# Patient Record
Sex: Male | Born: 1975 | Hispanic: Yes | Smoking: Former smoker
Health system: Southern US, Community
[De-identification: ages and names within clinical notes are randomized; demographics above are authoritative.]

---

## 2021-05-24 ENCOUNTER — Emergency Department (HOSPITAL_COMMUNITY): Payer: Self-pay

## 2021-05-24 ENCOUNTER — Inpatient Hospital Stay (HOSPITAL_COMMUNITY)
Admission: EM | Admit: 2021-05-24 | Discharge: 2021-07-07 | DRG: 853 | Disposition: A | Discharge status: Home / Self Care | Payer: Self-pay | Attending: Internal Medicine | Admitting: Internal Medicine

## 2021-05-24 ENCOUNTER — Other Ambulatory Visit: Payer: Self-pay

## 2021-05-24 ENCOUNTER — Inpatient Hospital Stay (HOSPITAL_COMMUNITY): Payer: Self-pay

## 2021-05-24 DIAGNOSIS — M6282 Rhabdomyolysis: Secondary | ICD-10-CM | POA: Diagnosis present

## 2021-05-24 DIAGNOSIS — M60851 Other myositis, right thigh: Secondary | ICD-10-CM | POA: Diagnosis not present

## 2021-05-24 DIAGNOSIS — R262 Difficulty in walking, not elsewhere classified: Secondary | ICD-10-CM

## 2021-05-24 DIAGNOSIS — G9341 Metabolic encephalopathy: Secondary | ICD-10-CM | POA: Diagnosis present

## 2021-05-24 DIAGNOSIS — T68XXXA Hypothermia, initial encounter: Secondary | ICD-10-CM

## 2021-05-24 DIAGNOSIS — M6080 Other myositis, unspecified site: Secondary | ICD-10-CM

## 2021-05-24 DIAGNOSIS — F4321 Adjustment disorder with depressed mood: Secondary | ICD-10-CM

## 2021-05-24 DIAGNOSIS — E8770 Fluid overload, unspecified: Secondary | ICD-10-CM | POA: Diagnosis not present

## 2021-05-24 DIAGNOSIS — I469 Cardiac arrest, cause unspecified: Secondary | ICD-10-CM

## 2021-05-24 DIAGNOSIS — I739 Peripheral vascular disease, unspecified: Secondary | ICD-10-CM | POA: Insufficient documentation

## 2021-05-24 DIAGNOSIS — R509 Fever, unspecified: Secondary | ICD-10-CM

## 2021-05-24 DIAGNOSIS — Z452 Encounter for adjustment and management of vascular access device: Secondary | ICD-10-CM

## 2021-05-24 DIAGNOSIS — N170 Acute kidney failure with tubular necrosis: Secondary | ICD-10-CM | POA: Diagnosis present

## 2021-05-24 DIAGNOSIS — R34 Anuria and oliguria: Secondary | ICD-10-CM | POA: Diagnosis present

## 2021-05-24 DIAGNOSIS — N179 Acute kidney failure, unspecified: Secondary | ICD-10-CM | POA: Diagnosis present

## 2021-05-24 DIAGNOSIS — D638 Anemia in other chronic diseases classified elsewhere: Secondary | ICD-10-CM | POA: Diagnosis not present

## 2021-05-24 DIAGNOSIS — J189 Pneumonia, unspecified organism: Secondary | ICD-10-CM

## 2021-05-24 DIAGNOSIS — E871 Hypo-osmolality and hyponatremia: Secondary | ICD-10-CM | POA: Diagnosis not present

## 2021-05-24 DIAGNOSIS — J982 Interstitial emphysema: Secondary | ICD-10-CM

## 2021-05-24 DIAGNOSIS — K6812 Psoas muscle abscess: Secondary | ICD-10-CM

## 2021-05-24 DIAGNOSIS — A419 Sepsis, unspecified organism: Principal | ICD-10-CM | POA: Diagnosis present

## 2021-05-24 DIAGNOSIS — W19XXXA Unspecified fall, initial encounter: Secondary | ICD-10-CM

## 2021-05-24 DIAGNOSIS — E875 Hyperkalemia: Secondary | ICD-10-CM | POA: Diagnosis not present

## 2021-05-24 DIAGNOSIS — R680 Hypothermia, not associated with low environmental temperature: Secondary | ICD-10-CM | POA: Diagnosis present

## 2021-05-24 DIAGNOSIS — J9601 Acute respiratory failure with hypoxia: Secondary | ICD-10-CM | POA: Diagnosis present

## 2021-05-24 DIAGNOSIS — Z6820 Body mass index (BMI) 20.0-20.9, adult: Secondary | ICD-10-CM

## 2021-05-24 DIAGNOSIS — Z9289 Personal history of other medical treatment: Secondary | ICD-10-CM

## 2021-05-24 DIAGNOSIS — I10 Essential (primary) hypertension: Secondary | ICD-10-CM | POA: Diagnosis present

## 2021-05-24 DIAGNOSIS — R748 Abnormal levels of other serum enzymes: Secondary | ICD-10-CM

## 2021-05-24 DIAGNOSIS — K567 Ileus, unspecified: Secondary | ICD-10-CM | POA: Diagnosis not present

## 2021-05-24 DIAGNOSIS — D62 Acute posthemorrhagic anemia: Secondary | ICD-10-CM

## 2021-05-24 DIAGNOSIS — G7281 Critical illness myopathy: Secondary | ICD-10-CM | POA: Diagnosis not present

## 2021-05-24 DIAGNOSIS — J690 Pneumonitis due to inhalation of food and vomit: Secondary | ICD-10-CM | POA: Diagnosis present

## 2021-05-24 DIAGNOSIS — R100 Acute abdomen: Secondary | ICD-10-CM

## 2021-05-24 DIAGNOSIS — M21379 Foot drop, unspecified foot: Secondary | ICD-10-CM

## 2021-05-24 DIAGNOSIS — D75839 Thrombocytosis, unspecified: Secondary | ICD-10-CM | POA: Diagnosis not present

## 2021-05-24 DIAGNOSIS — L0390 Cellulitis, unspecified: Secondary | ICD-10-CM

## 2021-05-24 DIAGNOSIS — K529 Noninfective gastroenteritis and colitis, unspecified: Secondary | ICD-10-CM

## 2021-05-24 DIAGNOSIS — M7981 Nontraumatic hematoma of soft tissue: Secondary | ICD-10-CM | POA: Diagnosis not present

## 2021-05-24 DIAGNOSIS — M21372 Foot drop, left foot: Secondary | ICD-10-CM | POA: Diagnosis present

## 2021-05-24 DIAGNOSIS — R6521 Severe sepsis with septic shock: Secondary | ICD-10-CM | POA: Diagnosis present

## 2021-05-24 DIAGNOSIS — Z4659 Encounter for fitting and adjustment of other gastrointestinal appliance and device: Secondary | ICD-10-CM

## 2021-05-24 DIAGNOSIS — D508 Other iron deficiency anemias: Secondary | ICD-10-CM

## 2021-05-24 DIAGNOSIS — R579 Shock, unspecified: Secondary | ICD-10-CM

## 2021-05-24 DIAGNOSIS — M869 Osteomyelitis, unspecified: Secondary | ICD-10-CM

## 2021-05-24 DIAGNOSIS — E876 Hypokalemia: Secondary | ICD-10-CM | POA: Diagnosis present

## 2021-05-24 DIAGNOSIS — E860 Dehydration: Secondary | ICD-10-CM | POA: Diagnosis not present

## 2021-05-24 DIAGNOSIS — K7200 Acute and subacute hepatic failure without coma: Secondary | ICD-10-CM | POA: Diagnosis present

## 2021-05-24 DIAGNOSIS — E162 Hypoglycemia, unspecified: Secondary | ICD-10-CM | POA: Diagnosis present

## 2021-05-24 DIAGNOSIS — J9602 Acute respiratory failure with hypercapnia: Secondary | ICD-10-CM | POA: Diagnosis present

## 2021-05-24 DIAGNOSIS — E861 Hypovolemia: Secondary | ICD-10-CM | POA: Diagnosis not present

## 2021-05-24 DIAGNOSIS — E874 Mixed disorder of acid-base balance: Secondary | ICD-10-CM | POA: Diagnosis present

## 2021-05-24 DIAGNOSIS — Z20822 Contact with and (suspected) exposure to covid-19: Secondary | ICD-10-CM | POA: Diagnosis present

## 2021-05-24 DIAGNOSIS — I468 Cardiac arrest due to other underlying condition: Secondary | ICD-10-CM | POA: Diagnosis present

## 2021-05-24 DIAGNOSIS — D6489 Other specified anemias: Secondary | ICD-10-CM | POA: Diagnosis not present

## 2021-05-24 DIAGNOSIS — J9690 Respiratory failure, unspecified, unspecified whether with hypoxia or hypercapnia: Secondary | ICD-10-CM

## 2021-05-24 DIAGNOSIS — E43 Unspecified severe protein-calorie malnutrition: Secondary | ICD-10-CM

## 2021-05-24 DIAGNOSIS — M60852 Other myositis, left thigh: Secondary | ICD-10-CM | POA: Diagnosis not present

## 2021-05-24 LAB — POCT I-STAT 7, (LYTES, BLD GAS, ICA,H+H)
Acid-base deficit: 10 mmol/L — ABNORMAL HIGH (ref 0.0–2.0)
Acid-base deficit: 10 mmol/L — ABNORMAL HIGH (ref 0.0–2.0)
Acid-base deficit: 8 mmol/L — ABNORMAL HIGH (ref 0.0–2.0)
Acid-base deficit: 10 mmol/L — ABNORMAL HIGH (ref 0.0–2.0)
Acid-base deficit: 8 mmol/L — ABNORMAL HIGH (ref 0.0–2.0)
Bicarbonate: 19.7 mmol/L — ABNORMAL LOW (ref 20.0–28.0)
Bicarbonate: 19.3 mmol/L — ABNORMAL LOW (ref 20.0–28.0)
Bicarbonate: 20.4 mmol/L (ref 20.0–28.0)
Bicarbonate: 22 mmol/L (ref 20.0–28.0)
Bicarbonate: 19.5 mmol/L — ABNORMAL LOW (ref 20.0–28.0)
Calcium, Ion: 1 mmol/L — ABNORMAL LOW (ref 1.15–1.40)
Calcium, Ion: 0.98 mmol/L — ABNORMAL LOW (ref 1.15–1.40)
Calcium, Ion: 1.04 mmol/L — ABNORMAL LOW (ref 1.15–1.40)
Calcium, Ion: 0.96 mmol/L — ABNORMAL LOW (ref 1.15–1.40)
Calcium, Ion: 0.92 mmol/L — ABNORMAL LOW (ref 1.15–1.40)
HCT: 42 % (ref 39.0–52.0)
HCT: 45 % (ref 39.0–52.0)
HCT: 39 % (ref 39.0–52.0)
HCT: 44 % (ref 39.0–52.0)
HCT: 42 % (ref 39.0–52.0)
Hemoglobin: 14.3 g/dL (ref 13.0–17.0)
Hemoglobin: 15.3 g/dL (ref 13.0–17.0)
Hemoglobin: 13.3 g/dL (ref 13.0–17.0)
Hemoglobin: 15 g/dL (ref 13.0–17.0)
Hemoglobin: 14.3 g/dL (ref 13.0–17.0)
O2 Saturation: 84 %
O2 Saturation: 93 %
O2 Saturation: 98 %
O2 Saturation: 97 %
O2 Saturation: 96 %
Patient temperature: 34.9
Patient temperature: 37.7
Patient temperature: 38.7
Patient temperature: 37.2
Potassium: 4.1 mmol/L (ref 3.5–5.1)
Potassium: 4.2 mmol/L (ref 3.5–5.1)
Potassium: 4.7 mmol/L (ref 3.5–5.1)
Potassium: 5.5 mmol/L — ABNORMAL HIGH (ref 3.5–5.1)
Potassium: 5.6 mmol/L — ABNORMAL HIGH (ref 3.5–5.1)
Sodium: 142 mmol/L (ref 135–145)
Sodium: 141 mmol/L (ref 135–145)
Sodium: 137 mmol/L (ref 135–145)
Sodium: 136 mmol/L (ref 135–145)
Sodium: 135 mmol/L (ref 135–145)
TCO2: 21 mmol/L — ABNORMAL LOW (ref 22–32)
TCO2: 21 mmol/L — ABNORMAL LOW (ref 22–32)
TCO2: 22 mmol/L (ref 22–32)
TCO2: 24 mmol/L (ref 22–32)
TCO2: 21 mmol/L — ABNORMAL LOW (ref 22–32)
pCO2 arterial: 59.3 mmHg — ABNORMAL HIGH (ref 32.0–48.0)
pCO2 arterial: 48.4 mmHg — ABNORMAL HIGH (ref 32.0–48.0)
pCO2 arterial: 51.5 mmHg — ABNORMAL HIGH (ref 32.0–48.0)
pCO2 arterial: 80.1 mmHg (ref 32.0–48.0)
pCO2 arterial: 48.2 mmHg — ABNORMAL HIGH (ref 32.0–48.0)
pH, Arterial: 7.129 — CL (ref 7.350–7.450)
pH, Arterial: 7.197 — CL (ref 7.350–7.450)
pH, Arterial: 7.209 — ABNORMAL LOW (ref 7.350–7.450)
pH, Arterial: 7.058 — CL (ref 7.350–7.450)
pH, Arterial: 7.216 — ABNORMAL LOW (ref 7.350–7.450)
pO2, Arterial: 65 mmHg — ABNORMAL LOW (ref 83.0–108.0)
pO2, Arterial: 74 mmHg — ABNORMAL LOW (ref 83.0–108.0)
pO2, Arterial: 141 mmHg — ABNORMAL HIGH (ref 83.0–108.0)
pO2, Arterial: 136 mmHg — ABNORMAL HIGH (ref 83.0–108.0)
pO2, Arterial: 97 mmHg (ref 83.0–108.0)

## 2021-05-24 LAB — CBC WITH DIFFERENTIAL/PLATELET
Abs Immature Granulocytes: 0.21 10*3/uL — ABNORMAL HIGH (ref 0.00–0.07)
Basophils Absolute: 0 10*3/uL (ref 0.0–0.1)
Basophils Relative: 0 %
Eosinophils Absolute: 0.1 10*3/uL (ref 0.0–0.5)
Eosinophils Relative: 1 %
HCT: 53.1 % — ABNORMAL HIGH (ref 39.0–52.0)
Hemoglobin: 15.9 g/dL (ref 13.0–17.0)
Immature Granulocytes: 2 %
Lymphocytes Relative: 39 %
Lymphs Abs: 3.7 10*3/uL (ref 0.7–4.0)
MCH: 28.6 pg (ref 26.0–34.0)
MCHC: 29.9 g/dL — ABNORMAL LOW (ref 30.0–36.0)
MCV: 95.5 fL (ref 80.0–100.0)
Monocytes Absolute: 0.3 10*3/uL (ref 0.1–1.0)
Monocytes Relative: 3 %
Neutro Abs: 5.2 10*3/uL (ref 1.7–7.7)
Neutrophils Relative %: 55 %
Platelets: 256 10*3/uL (ref 150–400)
RBC: 5.56 MIL/uL (ref 4.22–5.81)
RDW: 13 % (ref 11.5–15.5)
WBC: 9.6 10*3/uL (ref 4.0–10.5)
nRBC: 0.6 % — ABNORMAL HIGH (ref 0.0–0.2)

## 2021-05-24 LAB — RAPID URINE DRUG SCREEN, HOSP PERFORMED
Amphetamines: NOT DETECTED
Barbiturates: NOT DETECTED
Benzodiazepines: NOT DETECTED
Cocaine: POSITIVE — AB
Opiates: NOT DETECTED
Tetrahydrocannabinol: NOT DETECTED

## 2021-05-24 LAB — COMPREHENSIVE METABOLIC PANEL
ALT: 1329 U/L — ABNORMAL HIGH (ref 0–44)
ALT: 1832 U/L — ABNORMAL HIGH (ref 0–44)
AST: 1176 U/L — ABNORMAL HIGH (ref 15–41)
AST: 2052 U/L — ABNORMAL HIGH (ref 15–41)
Albumin: 3 g/dL — ABNORMAL LOW (ref 3.5–5.0)
Albumin: 2.4 g/dL — ABNORMAL LOW (ref 3.5–5.0)
Alkaline Phosphatase: 121 U/L (ref 38–126)
Alkaline Phosphatase: 103 U/L (ref 38–126)
Anion gap: 19 — ABNORMAL HIGH (ref 5–15)
Anion gap: 11 (ref 5–15)
BUN: 17 mg/dL (ref 6–20)
BUN: 18 mg/dL (ref 6–20)
CO2: 14 mmol/L — ABNORMAL LOW (ref 22–32)
CO2: 24 mmol/L (ref 22–32)
Calcium: 8.2 mg/dL — ABNORMAL LOW (ref 8.9–10.3)
Calcium: 6.2 mg/dL — CL (ref 8.9–10.3)
Chloride: 112 mmol/L — ABNORMAL HIGH (ref 98–111)
Chloride: 113 mmol/L — ABNORMAL HIGH (ref 98–111)
Creatinine, Ser: 2.74 mg/dL — ABNORMAL HIGH (ref 0.61–1.24)
Creatinine, Ser: 2.38 mg/dL — ABNORMAL HIGH (ref 0.61–1.24)
GFR, Estimated: 28 mL/min — ABNORMAL LOW (ref >60)
GFR, Estimated: 33 mL/min — ABNORMAL LOW (ref >60)
Glucose, Bld: 77 mg/dL (ref 70–99)
Glucose, Bld: 86 mg/dL (ref 70–99)
Potassium: 2.6 mmol/L — CL (ref 3.5–5.1)
Potassium: 4.1 mmol/L (ref 3.5–5.1)
Sodium: 145 mmol/L (ref 135–145)
Sodium: 148 mmol/L — ABNORMAL HIGH (ref 135–145)
Total Bilirubin: 0.4 mg/dL (ref 0.3–1.2)
Total Bilirubin: 0.4 mg/dL (ref 0.3–1.2)
Total Protein: 5.5 g/dL — ABNORMAL LOW (ref 6.5–8.1)
Total Protein: 4.4 g/dL — ABNORMAL LOW (ref 6.5–8.1)

## 2021-05-24 LAB — I-STAT ARTERIAL BLOOD GAS, ED
Acid-base deficit: 25 mmol/L — ABNORMAL HIGH (ref 0.0–2.0)
Bicarbonate: 10.3 mmol/L — ABNORMAL LOW (ref 20.0–28.0)
Calcium, Ion: 1.15 mmol/L (ref 1.15–1.40)
HCT: 50 % (ref 39.0–52.0)
Hemoglobin: 17 g/dL (ref 13.0–17.0)
O2 Saturation: 95 %
Potassium: 2.5 mmol/L — CL (ref 3.5–5.1)
Sodium: 141 mmol/L (ref 135–145)
TCO2: 12 mmol/L — ABNORMAL LOW (ref 22–32)
pCO2 arterial: 61.4 mmHg — ABNORMAL HIGH (ref 32.0–48.0)
pH, Arterial: 6.834 — CL (ref 7.350–7.450)
pO2, Arterial: 135 mmHg — ABNORMAL HIGH (ref 83.0–108.0)

## 2021-05-24 LAB — ABO/RH: ABO/RH(D): O POS

## 2021-05-24 LAB — TYPE AND SCREEN
ABO/RH(D): O POS
Antibody Screen: NEGATIVE

## 2021-05-24 LAB — I-STAT VENOUS BLOOD GAS, ED
Acid-base deficit: 25 mmol/L — ABNORMAL HIGH (ref 0.0–2.0)
Bicarbonate: 13 mmol/L — ABNORMAL LOW (ref 20.0–28.0)
Calcium, Ion: 1.14 mmol/L — ABNORMAL LOW (ref 1.15–1.40)
HCT: 49 % (ref 39.0–52.0)
Hemoglobin: 16.7 g/dL (ref 13.0–17.0)
O2 Saturation: 67 %
Potassium: 2.6 mmol/L — CL (ref 3.5–5.1)
Sodium: 143 mmol/L (ref 135–145)
TCO2: 16 mmol/L — ABNORMAL LOW (ref 22–32)
pCO2, Ven: 92.9 mmHg (ref 44.0–60.0)
pH, Ven: 6.752 — CL (ref 7.250–7.430)
pO2, Ven: 69 mmHg — ABNORMAL HIGH (ref 32.0–45.0)

## 2021-05-24 LAB — CBC
HCT: 44.5 % (ref 39.0–52.0)
Hemoglobin: 13.9 g/dL (ref 13.0–17.0)
MCH: 27.5 pg (ref 26.0–34.0)
MCHC: 31.2 g/dL (ref 30.0–36.0)
MCV: 87.9 fL (ref 80.0–100.0)
Platelets: 177 10*3/uL (ref 150–400)
RBC: 5.06 MIL/uL (ref 4.22–5.81)
RDW: 13.2 % (ref 11.5–15.5)
WBC: 4.1 10*3/uL (ref 4.0–10.5)
nRBC: 0.7 % — ABNORMAL HIGH (ref 0.0–0.2)

## 2021-05-24 LAB — ECHOCARDIOGRAM COMPLETE
Area-P 1/2: 3.72 cm2
Height: 72 in
S' Lateral: 3.4 cm
Single Plane A2C EF: 56.3 %

## 2021-05-24 LAB — MRSA NEXT GEN BY PCR, NASAL: MRSA by PCR Next Gen: NOT DETECTED

## 2021-05-24 LAB — CBG MONITORING, ED
Glucose-Capillary: 76 mg/dL (ref 70–99)
Glucose-Capillary: 59 mg/dL — ABNORMAL LOW (ref 70–99)
Glucose-Capillary: 92 mg/dL (ref 70–99)

## 2021-05-24 LAB — URINALYSIS, ROUTINE W REFLEX MICROSCOPIC
Bilirubin Urine: NEGATIVE
Glucose, UA: NEGATIVE mg/dL
Ketones, ur: NEGATIVE mg/dL
Leukocytes,Ua: NEGATIVE
Nitrite: NEGATIVE
Protein, ur: NEGATIVE mg/dL
Specific Gravity, Urine: <1.005 — ABNORMAL LOW (ref 1.005–1.030)
pH: 5.5 (ref 5.0–8.0)

## 2021-05-24 LAB — HIV ANTIBODY (ROUTINE TESTING W REFLEX): HIV Screen 4th Generation wRfx: NONREACTIVE

## 2021-05-24 LAB — ETHANOL: Alcohol, Ethyl (B): 44 mg/dL — ABNORMAL HIGH (ref <10)

## 2021-05-24 LAB — URINALYSIS, MICROSCOPIC (REFLEX): Squamous Epithelial / HPF: NONE SEEN (ref 0–5)

## 2021-05-24 LAB — ACETAMINOPHEN LEVEL: Acetaminophen (Tylenol), Serum: <10 ug/mL — ABNORMAL LOW (ref 10–30)

## 2021-05-24 LAB — GLUCOSE, CAPILLARY: Glucose-Capillary: 82 mg/dL (ref 70–99)

## 2021-05-24 LAB — MAGNESIUM: Magnesium: 3.2 mg/dL — ABNORMAL HIGH (ref 1.7–2.4)

## 2021-05-24 LAB — RESP PANEL BY RT-PCR (FLU A&B, COVID) ARPGX2
Influenza A by PCR: NEGATIVE
Influenza B by PCR: NEGATIVE
SARS Coronavirus 2 by RT PCR: NEGATIVE

## 2021-05-24 LAB — PROTIME-INR
INR: 1.2 (ref 0.8–1.2)
Prothrombin Time: 15.2 seconds (ref 11.4–15.2)

## 2021-05-24 LAB — CK: Total CK: 14071 U/L — ABNORMAL HIGH (ref 49–397)

## 2021-05-24 LAB — SALICYLATE LEVEL: Salicylate Lvl: <7 mg/dL — ABNORMAL LOW (ref 7.0–30.0)

## 2021-05-24 LAB — LACTIC ACID, PLASMA
Lactic Acid, Venous: >9 mmol/L (ref 0.5–1.9)
Lactic Acid, Venous: >9 mmol/L (ref 0.5–1.9)

## 2021-05-24 LAB — TROPONIN I (HIGH SENSITIVITY)
Troponin I (High Sensitivity): 475 ng/L (ref <18)
Troponin I (High Sensitivity): 658 ng/L (ref <18)

## 2021-05-24 IMAGING — DX DG CHEST 1V PORT
1 series · 1 of 1 positions shown · non-contrast
Comparison: None.

CLINICAL DATA: Post CPR comb intubated

EXAM:
PORTABLE CHEST 1 VIEW

[chest ap]
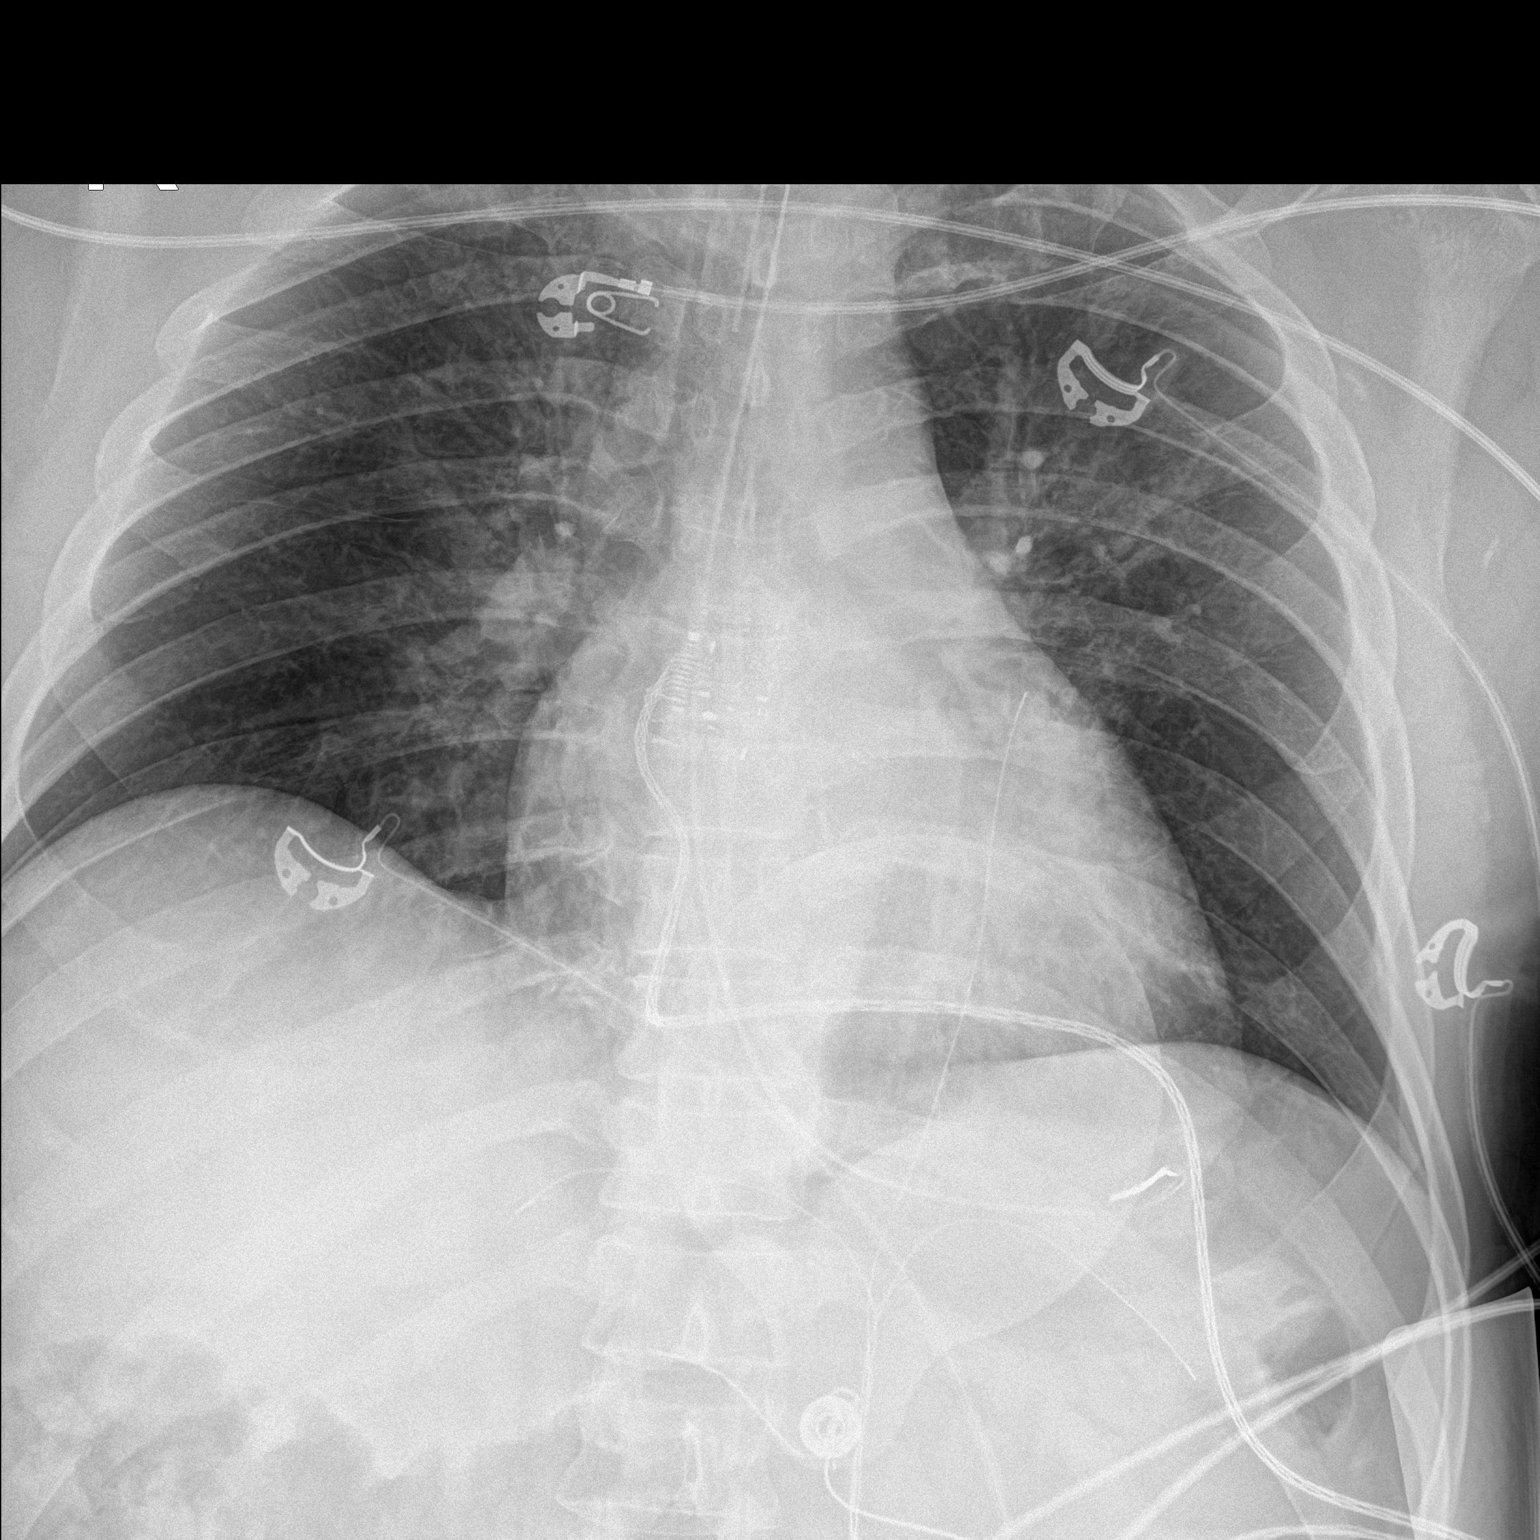

[1 of 1 positions shown; findings below may reference images not displayed]

FINDINGS: Endotracheal tube tip is 3.4 cm above the carina. Enteric tube
terminates in the gastric fundus. Pacer pad overlies the left heart.
Normal heart size and normal mediastinal contour. No pneumothorax.
No pleural effusion. Mild diffuse prominence of the central
interstitial markings. No consolidative airspace disease.
IMPRESSION: 1. Well-positioned endotracheal and enteric tubes.
2. Mild diffuse prominence of the central interstitial markings,
suggesting mild pulmonary edema.

## 2021-05-24 IMAGING — DX DG CHEST 1V PORT
1 series · 1 of 1 positions shown · non-contrast
Comparison: [DATE] at [3N] hours

CLINICAL DATA: Central line placement

EXAM:
PORTABLE CHEST 1 VIEW

[chest]
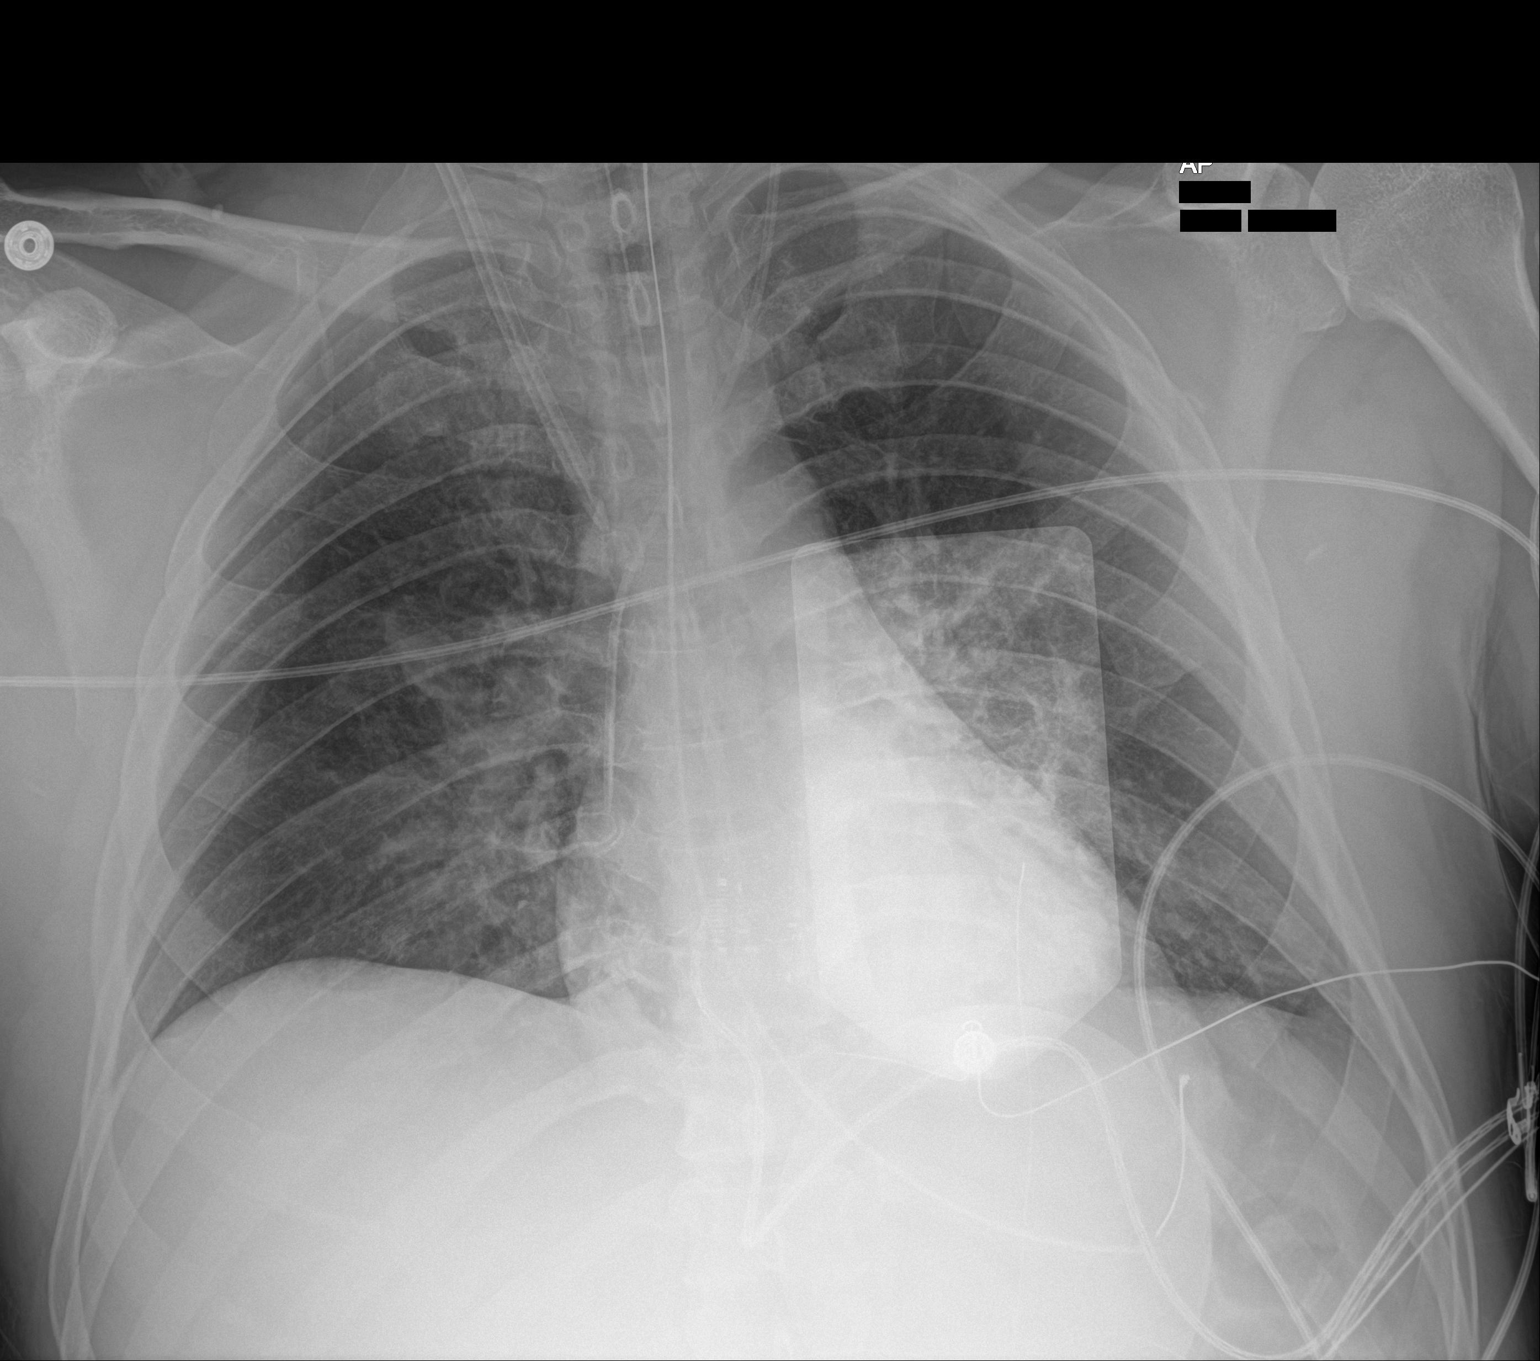

[1 of 1 positions shown; findings below may reference images not displayed]

FINDINGS: Interval placement of dual lumen right IJ central venous catheter
with distal tip terminating at the level of the proximal SVC. Stable
positioning of left IJ central line. ET tube terminates 4.2 cm above
the carina. Enteric tube is located within the stomach. Stable heart
size. Slightly low lung volumes with crowding of the central
bronchovascular markings. No pneumothorax.
IMPRESSION: Interval placement of right IJ central venous catheter with distal
tip terminating at the level of the proximal SVC. No pneumothorax.

## 2021-05-24 IMAGING — CT CT HEAD W/O CM
3 of 5 series · 14 of 47 positions shown, 16 images · non-contrast
Comparison: None.

CLINICAL DATA: Patient found unresponsive this morning. CPR for 20
minutes.



[Series 5: head 3.0 mpr cor · coronal · 0.36mm/px · 3 of 73 slices shown]
[im 28/73  brain]
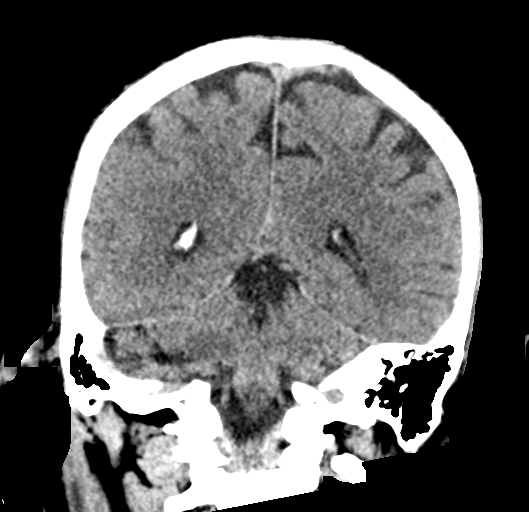
[im 34/73  brain]
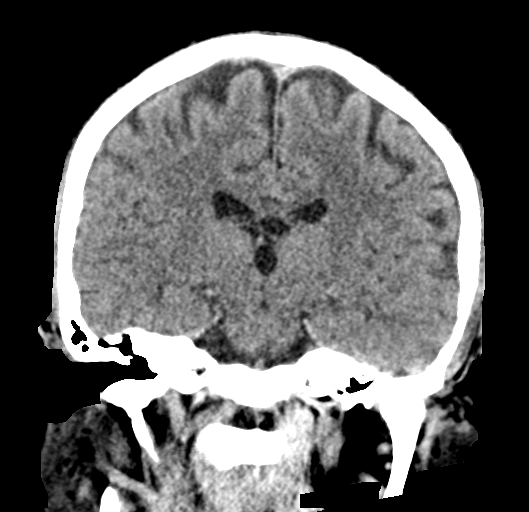
[im 39/73  brain]
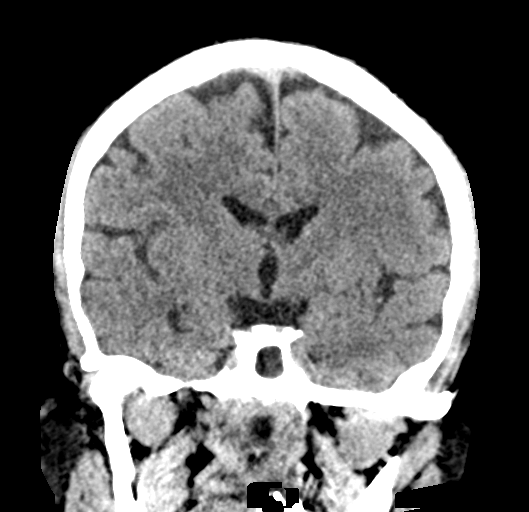

[Series 6: head 3.0 mpr sag · sagittal · 0.35mm/px · 3 of 59 slices shown]
[im 23/59  brain]
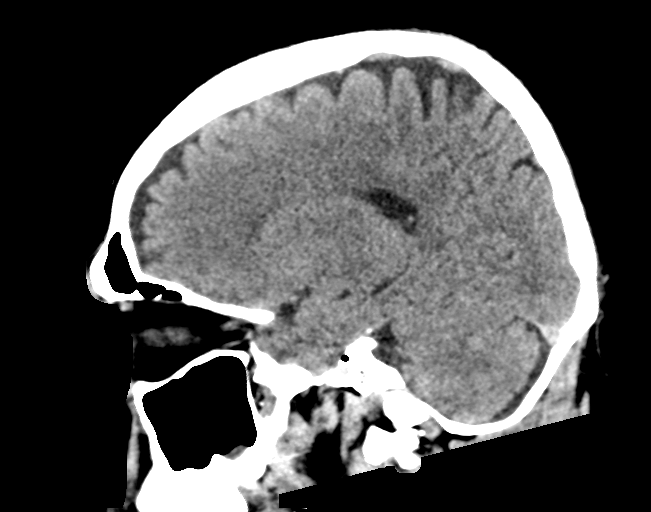
[im 30/59  brain]
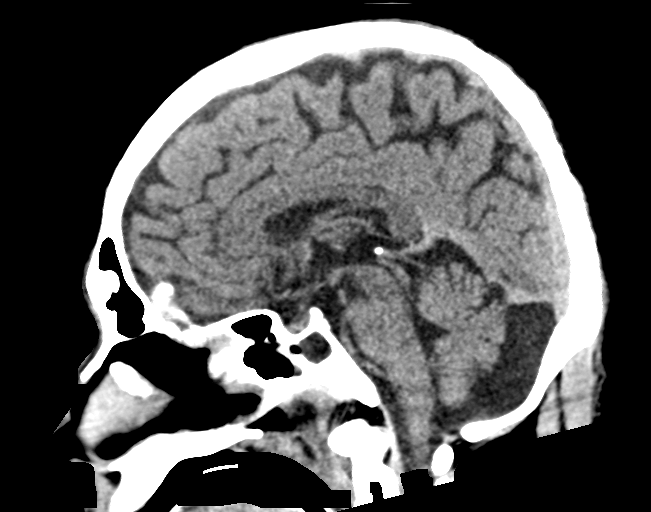
[im 36/59  brain]
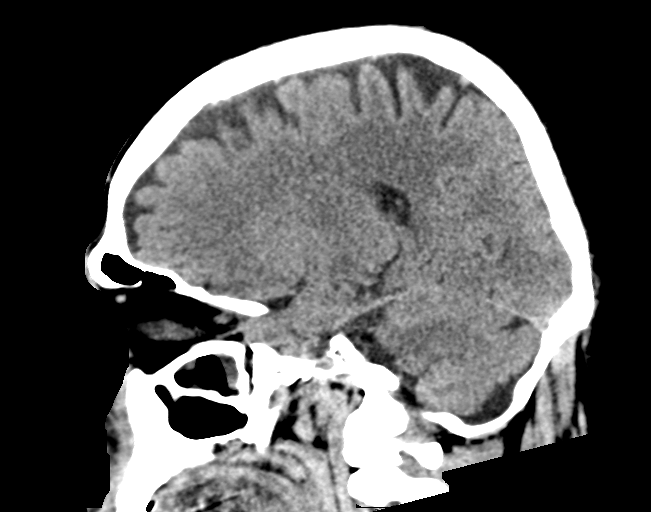

[Series 8: head 2.0 mpr ax · axial · 0.36mm/px · z∈[-72,+70]mm · 8 of 90 slices shown, 10 images]
[im 6/90  brain]
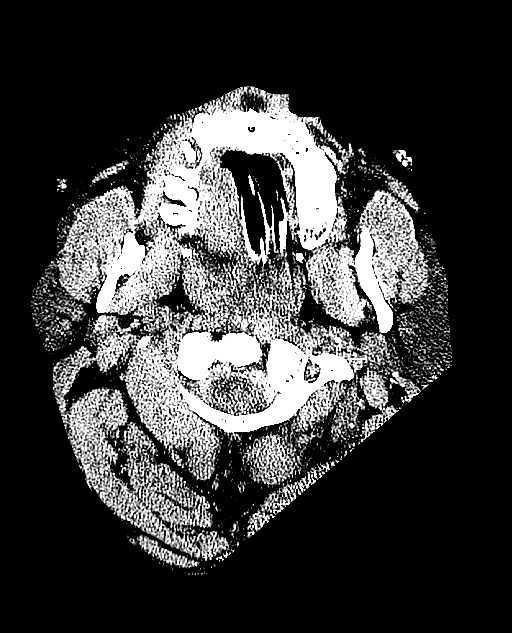
[im 6/90  bone]
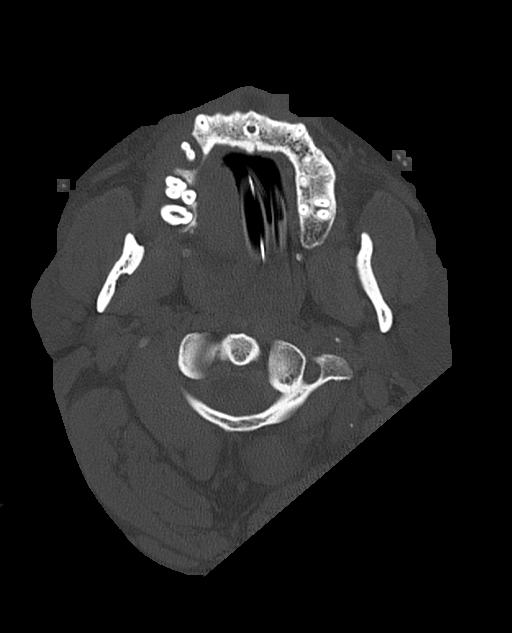
[im 17/90  brain]
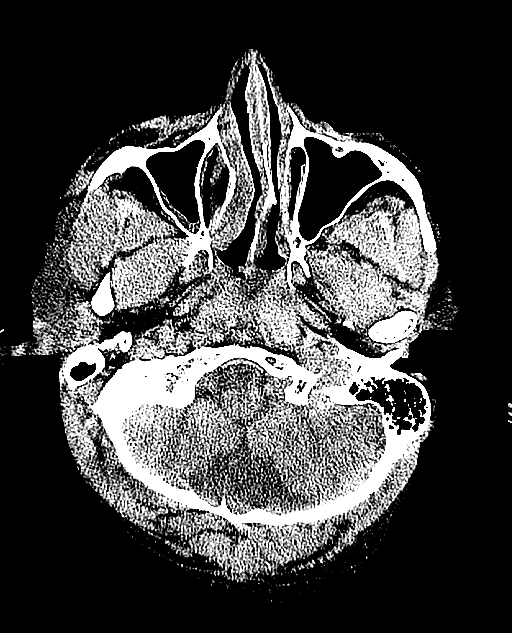
[im 28/90  brain]
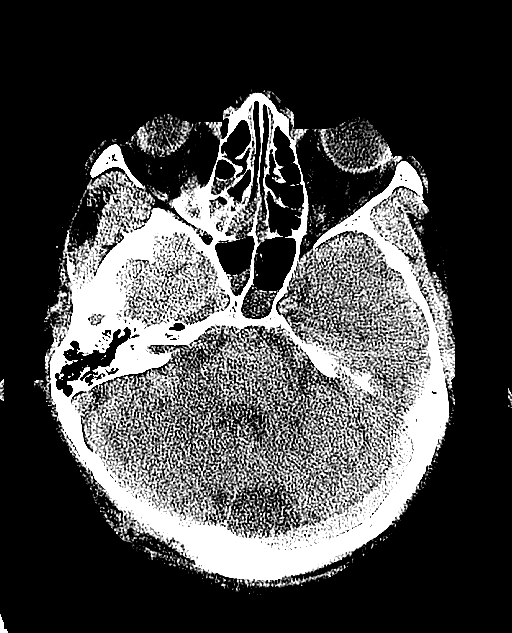
[im 39/90  brain]
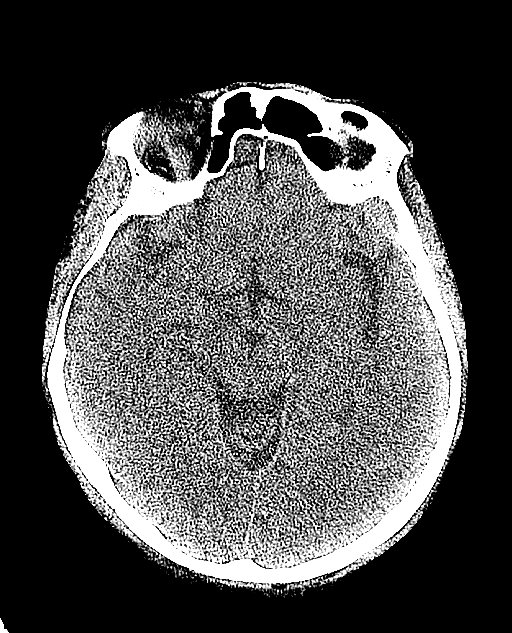
[im 51/90  brain]
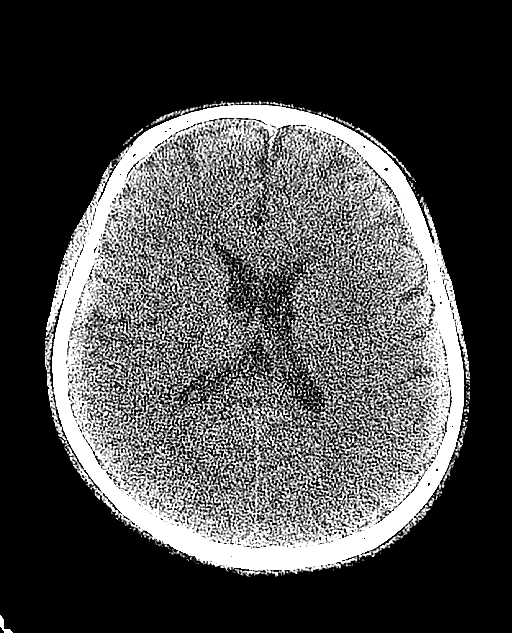
[im 51/90  bone]
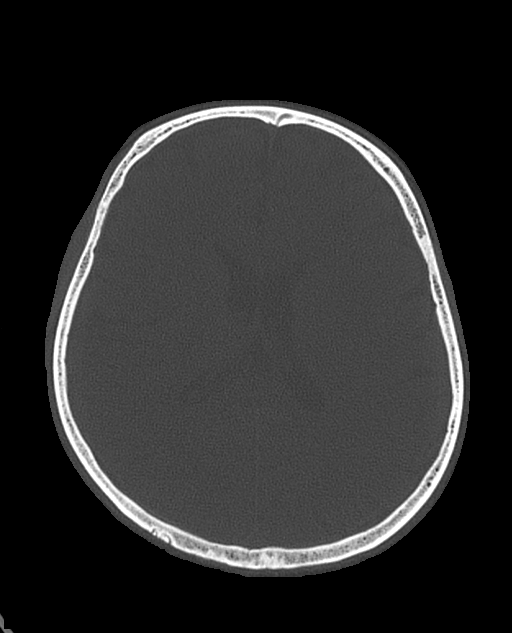
[im 62/90  brain]
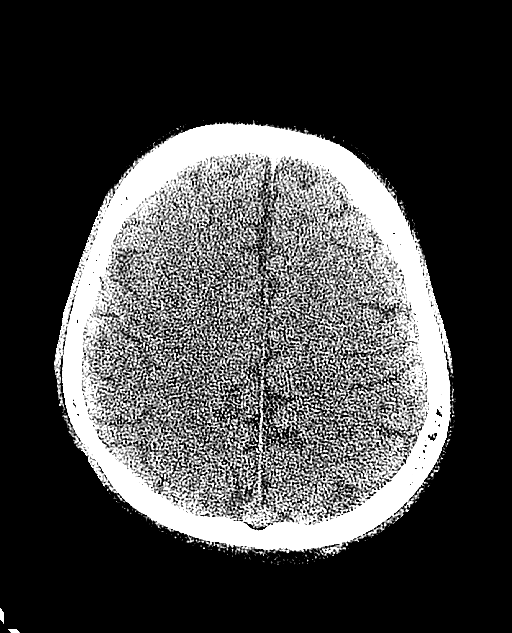
[im 73/90  brain]
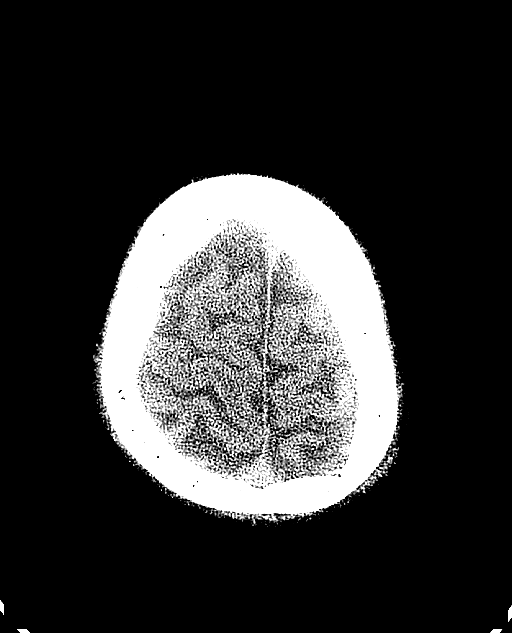
[im 84/90  brain]
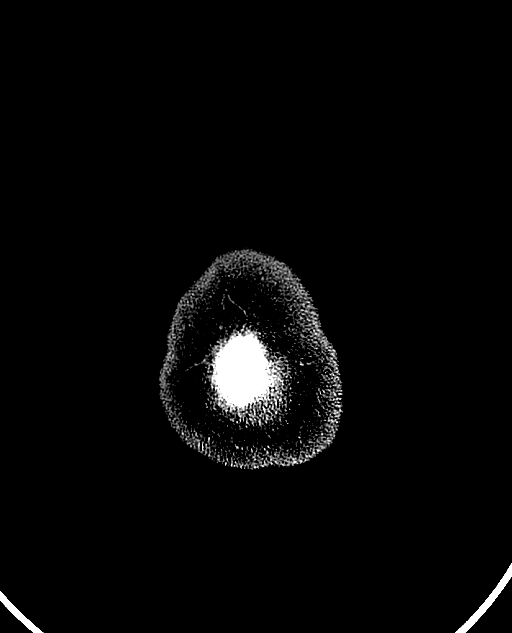

[14 of 47 positions shown; findings below may reference images not displayed]

FINDINGS: Brain: No evidence of acute infarction, hemorrhage, hydrocephalus,
extra-axial collection or mass lesion/mass effect.

Vascular: No hyperdense vessel or unexpected calcification.

Skull: Normal. Negative for fracture or focal lesion.

Sinuses/Orbits: Globes and orbits are unremarkable.

Dependent fluid in the sphenoid posterior right ethmoid air cells
and right maxillary sinus. Scattered mucosal thickening. Patient is
intubated.

Other: None.
IMPRESSION: 1. No intracranial abnormalities.

## 2021-05-24 IMAGING — DX DG CHEST 1V PORT
1 series · 1 of 1 positions shown · non-contrast
Comparison: Prior today

CLINICAL DATA: Cardiac arrest. Central line placement.

EXAM:
PORTABLE CHEST 1 VIEW

[chest]
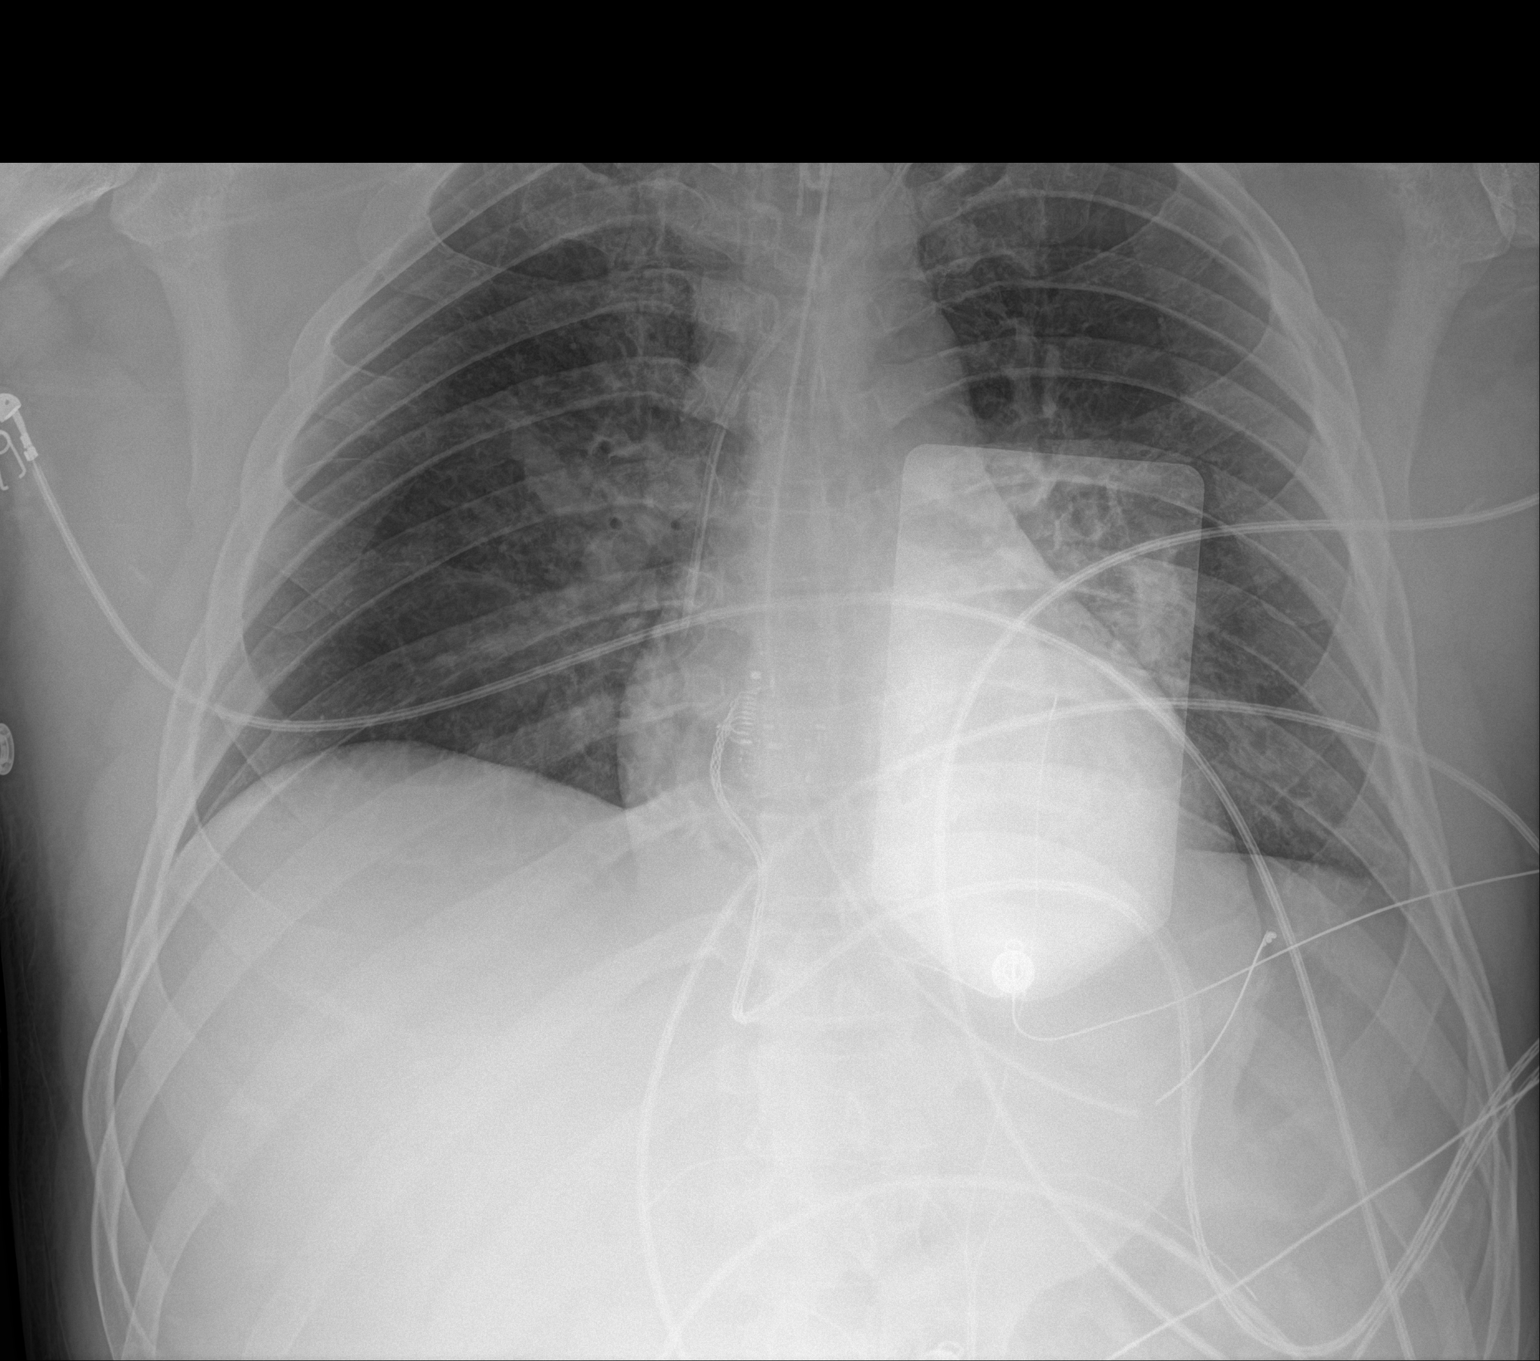

[1 of 1 positions shown; findings below may reference images not displayed]

FINDINGS: A new left jugular central venous catheter seen with tip overlying
the superior cavoatrial junction. No evidence of pneumothorax.
Endotracheal tube and nasogastric tube remain in appropriate
position.

Heart size is within normal limits.  Both lungs are clear.
IMPRESSION: New left jugular central venous catheter in appropriate position. No
evidence of pneumothorax or other acute findings.

## 2021-05-24 MED ORDER — LACTATED RINGERS IV BOLUS
1000.0000 mL | Freq: Once | INTRAVENOUS | Status: AC
Start: 2021-05-24 — End: 2021-05-24
  Administered 2021-05-24: 1000 mL via INTRAVENOUS

## 2021-05-24 MED ORDER — HEPARIN SODIUM (PORCINE) 5000 UNIT/ML IJ SOLN
5000.0000 [IU] | Freq: Three times a day (TID) | INTRAMUSCULAR | Status: DC
  Administered 2021-05-24 – 2021-05-26 (×6): 5000 [IU] via SUBCUTANEOUS
  Filled 2021-05-24 (×6): qty 1

## 2021-05-24 MED ORDER — NOREPINEPHRINE 4 MG/250ML-% IV SOLN
0.0000 ug/min | INTRAVENOUS | Status: DC
  Administered 2021-05-24: 70 ug/min via INTRAVENOUS
  Filled 2021-05-24: qty 250

## 2021-05-24 MED ORDER — FENTANYL CITRATE (PF) 100 MCG/2ML IJ SOLN
INTRAMUSCULAR | Status: AC
  Filled 2021-05-24: qty 2

## 2021-05-24 MED ORDER — MIDAZOLAM HCL 2 MG/2ML IJ SOLN
4.0000 mg | Freq: Once | INTRAMUSCULAR | Status: AC
  Administered 2021-05-24: 4 mg via INTRAVENOUS

## 2021-05-24 MED ORDER — LACTATED RINGERS IV BOLUS
1000.0000 mL | Freq: Once | INTRAVENOUS | Status: AC
  Administered 2021-05-24: 1000 mL via INTRAVENOUS

## 2021-05-24 MED ORDER — CHLORHEXIDINE GLUCONATE CLOTH 2 % EX PADS
6.0000 | MEDICATED_PAD | Freq: Every day | CUTANEOUS | Status: DC
  Administered 2021-05-25 – 2021-06-03 (×8): 6 via TOPICAL

## 2021-05-24 MED ORDER — PRISMASOL BGK 4/2.5 32-4-2.5 MEQ/L EC SOLN: Status: DC

## 2021-05-24 MED ORDER — FENTANYL CITRATE PF 50 MCG/ML IJ SOSY: 100.0000 ug | PREFILLED_SYRINGE | INTRAMUSCULAR | Status: DC | PRN

## 2021-05-24 MED ORDER — SODIUM CHLORIDE 0.9 % IV BOLUS
2000.0000 mL | Freq: Once | INTRAVENOUS | Status: AC
  Administered 2021-05-24: 2000 mL via INTRAVENOUS

## 2021-05-24 MED ORDER — MIDAZOLAM HCL 2 MG/2ML IJ SOLN: 2.0000 mg | INTRAMUSCULAR | Status: DC | PRN

## 2021-05-24 MED ORDER — MIDAZOLAM-SODIUM CHLORIDE 100-0.9 MG/100ML-% IV SOLN
0.5000 mg/h | INTRAVENOUS | Status: DC
  Administered 2021-05-24: 2 mg/h via INTRAVENOUS
  Administered 2021-05-25 (×3): 10 mg/h via INTRAVENOUS
  Administered 2021-05-27: 0.5 mg/h via INTRAVENOUS
  Filled 2021-05-24 (×5): qty 100

## 2021-05-24 MED ORDER — MIDAZOLAM HCL 2 MG/2ML IJ SOLN
INTRAMUSCULAR | Status: AC
  Filled 2021-05-24: qty 2

## 2021-05-24 MED ORDER — DEXTROSE 50 % IV SOLN
25.0000 mL | Freq: Once | INTRAVENOUS | Status: AC
  Administered 2021-05-24: 25 mL via INTRAVENOUS

## 2021-05-24 MED ORDER — POTASSIUM CHLORIDE 10 MEQ/100ML IV SOLN
10.0000 meq | INTRAVENOUS | Status: DC
  Administered 2021-05-24: 10 meq via INTRAVENOUS
  Filled 2021-05-24 (×2): qty 100

## 2021-05-24 MED ORDER — SODIUM BICARBONATE 8.4 % IV SOLN: 200.0000 meq | Freq: Once | INTRAVENOUS | Status: AC

## 2021-05-24 MED ORDER — ALTEPLASE 2 MG IJ SOLR
2.0000 mg | Freq: Once | INTRAMUSCULAR | Status: AC | PRN
  Administered 2021-05-24: 2 mg
  Filled 2021-05-24: qty 2

## 2021-05-24 MED ORDER — CALCIUM GLUCONATE-NACL 2-0.675 GM/100ML-% IV SOLN
2.0000 g | Freq: Once | INTRAVENOUS | Status: AC
  Administered 2021-05-24: 2000 mg via INTRAVENOUS
  Filled 2021-05-24: qty 100

## 2021-05-24 MED ORDER — SODIUM BICARBONATE 8.4 % IV SOLN
INTRAVENOUS | Status: DC
  Filled 2021-05-24 (×4): qty 1000

## 2021-05-24 MED ORDER — STERILE WATER FOR INJECTION IV SOLN
INTRAVENOUS | Status: DC
  Filled 2021-05-24 (×6): qty 150

## 2021-05-24 MED ORDER — SODIUM CHLORIDE 0.9 % IV SOLN
250.0000 mL | INTRAVENOUS | Status: DC
  Administered 2021-05-24 – 2021-06-22 (×3): 250 mL via INTRAVENOUS

## 2021-05-24 MED ORDER — DEXTROSE 50 % IV SOLN
INTRAVENOUS | Status: AC
  Filled 2021-05-24: qty 50

## 2021-05-24 MED ORDER — SODIUM CHLORIDE 0.9 % IV SOLN
1.0000 g | INTRAVENOUS | Status: DC
  Administered 2021-05-24 – 2021-05-25 (×2): 1 g via INTRAVENOUS
  Filled 2021-05-24 (×2): qty 10

## 2021-05-24 MED ORDER — SODIUM BICARBONATE 8.4 % IV SOLN
INTRAVENOUS | Status: AC
  Administered 2021-05-24: 200 meq via INTRAVENOUS
  Filled 2021-05-24: qty 50

## 2021-05-24 MED ORDER — MIDAZOLAM HCL 2 MG/2ML IJ SOLN
4.0000 mg | INTRAMUSCULAR | Status: DC | PRN
Start: 2021-05-24 — End: 2021-05-28

## 2021-05-24 MED ORDER — STERILE WATER FOR INJECTION IV SOLN
INTRAVENOUS | Status: DC
  Filled 2021-05-24 (×5): qty 150

## 2021-05-24 MED ORDER — NOREPINEPHRINE 16 MG/250ML-% IV SOLN: 2.0000 ug/min | INTRAVENOUS | Status: DC

## 2021-05-24 MED ORDER — POLYETHYLENE GLYCOL 3350 17 G PO PACK: 17.0000 g | PACK | Freq: Every day | ORAL | Status: DC | PRN

## 2021-05-24 MED ORDER — SODIUM CHLORIDE 0.9 % IV SOLN: INTRAVENOUS | Status: DC | PRN

## 2021-05-24 MED ORDER — MIDAZOLAM HCL 2 MG/2ML IJ SOLN
2.0000 mg | INTRAMUSCULAR | Status: DC | PRN
  Administered 2021-05-24: 2 mg via INTRAVENOUS

## 2021-05-24 MED ORDER — POTASSIUM CHLORIDE 10 MEQ/100ML IV SOLN
10.0000 meq | INTRAVENOUS | Status: AC
  Administered 2021-05-24 (×3): 10 meq via INTRAVENOUS
  Filled 2021-05-24 (×2): qty 100

## 2021-05-24 MED ORDER — DOCUSATE SODIUM 50 MG/5ML PO LIQD: 100.0000 mg | Freq: Two times a day (BID) | ORAL | Status: DC | PRN

## 2021-05-24 MED ORDER — FENTANYL CITRATE PF 50 MCG/ML IJ SOSY
100.0000 ug | PREFILLED_SYRINGE | Freq: Once | INTRAMUSCULAR | Status: DC
  Filled 2021-05-24: qty 2

## 2021-05-24 MED ORDER — FENTANYL CITRATE PF 50 MCG/ML IJ SOSY: 200.0000 ug | PREFILLED_SYRINGE | Freq: Once | INTRAMUSCULAR | Status: DC

## 2021-05-24 MED ORDER — ALBUTEROL SULFATE (2.5 MG/3ML) 0.083% IN NEBU
2.5000 mg | INHALATION_SOLUTION | RESPIRATORY_TRACT | Status: DC | PRN
  Administered 2021-05-24: 2.5 mg via RESPIRATORY_TRACT
  Filled 2021-05-24: qty 3

## 2021-05-24 MED ORDER — SODIUM BICARBONATE 8.4 % IV SOLN
INTRAVENOUS | Status: AC
  Filled 2021-05-24: qty 50

## 2021-05-24 MED ORDER — POTASSIUM CHLORIDE 10 MEQ/100ML IV SOLN
10.0000 meq | Freq: Once | INTRAVENOUS | Status: AC
  Administered 2021-05-24: 10 meq via INTRAVENOUS

## 2021-05-24 MED ORDER — FENTANYL 2500MCG IN NS 250ML (10MCG/ML) PREMIX INFUSION
0.0000 ug/h | INTRAVENOUS | Status: DC
  Administered 2021-05-24: 200 ug/h via INTRAVENOUS
  Administered 2021-05-24: 300 ug/h via INTRAVENOUS
  Administered 2021-05-24: 400 ug/h via INTRAVENOUS
  Administered 2021-05-25 (×2): 300 ug/h via INTRAVENOUS
  Filled 2021-05-24 (×5): qty 250

## 2021-05-24 MED ORDER — PANTOPRAZOLE SODIUM 40 MG IV SOLR
40.0000 mg | Freq: Two times a day (BID) | INTRAVENOUS | Status: DC
Start: 2021-05-24 — End: 2021-06-05
  Administered 2021-05-24 – 2021-06-04 (×23): 40 mg via INTRAVENOUS
  Filled 2021-05-24 (×23): qty 40

## 2021-05-24 MED ORDER — CALCIUM GLUCONATE-NACL 2-0.675 GM/100ML-% IV SOLN
2.0000 g | Freq: Once | INTRAVENOUS | Status: AC
Start: 2021-05-25 — End: 2021-05-25
  Administered 2021-05-25: 2000 mg via INTRAVENOUS
  Filled 2021-05-24: qty 100

## 2021-05-24 MED ORDER — VASOPRESSIN 20 UNITS/100 ML INFUSION FOR SHOCK
0.0000 [IU]/min | INTRAVENOUS | Status: DC
  Administered 2021-05-24 – 2021-05-25 (×5): 0.04 [IU]/min via INTRAVENOUS
  Administered 2021-05-26 (×2): 0.03 [IU]/min via INTRAVENOUS
  Filled 2021-05-24 (×7): qty 100

## 2021-05-24 MED ORDER — HEPARIN SODIUM (PORCINE) 1000 UNIT/ML DIALYSIS
1000.0000 [IU] | INTRAMUSCULAR | Status: DC | PRN
  Administered 2021-05-24: 2400 [IU] via INTRAVENOUS_CENTRAL
  Administered 2021-05-25 (×2): 1000 [IU] via INTRAVENOUS_CENTRAL
  Administered 2021-05-28: 2800 [IU] via INTRAVENOUS_CENTRAL
  Filled 2021-05-24 (×5): qty 6

## 2021-05-24 MED ORDER — VECURONIUM BROMIDE 10 MG IV SOLR
INTRAVENOUS | Status: AC
  Administered 2021-05-24: 10 mg
  Filled 2021-05-24: qty 10

## 2021-05-24 MED ORDER — NOREPINEPHRINE 4 MG/250ML-% IV SOLN
2.0000 ug/min | INTRAVENOUS | Status: DC
  Administered 2021-05-24: 10 ug/min via INTRAVENOUS
  Administered 2021-05-24: 20 ug/min via INTRAVENOUS
  Filled 2021-05-24: qty 250
  Filled 2021-05-24: qty 500

## 2021-05-24 MED ORDER — FENTANYL CITRATE PF 50 MCG/ML IJ SOSY
100.0000 ug | PREFILLED_SYRINGE | INTRAMUSCULAR | Status: DC | PRN
  Administered 2021-05-24 – 2021-05-27 (×2): 100 ug via INTRAVENOUS
  Administered 2021-05-27 (×2): 50 ug via INTRAVENOUS
  Administered 2021-05-27 – 2021-05-28 (×7): 100 ug via INTRAVENOUS
  Filled 2021-05-24 (×10): qty 2

## 2021-05-24 MED ORDER — SODIUM CHLORIDE 0.9 % FOR CRRT: INTRAVENOUS_CENTRAL | Status: DC | PRN

## 2021-05-24 MED ORDER — SODIUM BICARBONATE 8.4 % IV SOLN
100.0000 meq | Freq: Once | INTRAVENOUS | Status: AC
  Administered 2021-05-25: 100 meq via INTRAVENOUS
  Filled 2021-05-24: qty 50

## 2021-05-24 MED ORDER — ALTEPLASE 2 MG IJ SOLR
2.0000 mg | Freq: Once | INTRAMUSCULAR | Status: AC
  Administered 2021-05-24: 2 mg

## 2021-05-24 MED ORDER — NOREPINEPHRINE 16 MG/250ML-% IV SOLN
0.0000 ug/min | INTRAVENOUS | Status: DC
  Administered 2021-05-24: 40 ug/min via INTRAVENOUS
  Administered 2021-05-24: 50 ug/min via INTRAVENOUS
  Administered 2021-05-25: 33 ug/min via INTRAVENOUS
  Administered 2021-05-25: 38 ug/min via INTRAVENOUS
  Administered 2021-05-26: 24 ug/min via INTRAVENOUS
  Administered 2021-05-26: 20 ug/min via INTRAVENOUS
  Filled 2021-05-24 (×7): qty 250

## 2021-05-24 MED ORDER — SODIUM ZIRCONIUM CYCLOSILICATE 10 G PO PACK
10.0000 g | PACK | Freq: Once | ORAL | Status: AC
  Administered 2021-05-25: 10 g
  Filled 2021-05-24: qty 1

## 2021-05-24 MED ORDER — PHENYLEPHRINE HCL-NACL 20-0.9 MG/250ML-% IV SOLN
0.0000 ug/min | INTRAVENOUS | Status: DC
  Administered 2021-05-24: 20 ug/min via INTRAVENOUS
  Administered 2021-05-24: 300 ug/min via INTRAVENOUS
  Administered 2021-05-24: 350 ug/min via INTRAVENOUS
  Administered 2021-05-25: 300 ug/min via INTRAVENOUS
  Administered 2021-05-25: 180 ug/min via INTRAVENOUS
  Administered 2021-05-25: 220 ug/min via INTRAVENOUS
  Administered 2021-05-25: 150 ug/min via INTRAVENOUS
  Administered 2021-05-25: 250 ug/min via INTRAVENOUS
  Filled 2021-05-24 (×4): qty 250
  Filled 2021-05-24: qty 500
  Filled 2021-05-24 (×2): qty 250

## 2021-05-24 NOTE — Progress Notes (Addendum)
eLink Physician-Brief Progress Note Patient Name: Andres Braun DOB: [birth year] MRN: [id number]   Date of Service  05/24/2021  HPI/Events of Note  Hypotension - BP = 88/71 with CVP = 21. Currently on a Norepinephrine, Phenylephrine and Vasopressin IV infusions. ABG on 100%/PRVC 32/TV 620/P 12 = 7.058/80.1/136/22. Currently on a NaHCO3 IV infusion at 150 mL/hour. Hypocalcemia - ionized Ca++ = 0.96.  eICU Interventions  Plan: NaHCO3 200 meq IV now. Incease ceiling on Norepinephrine IV infusion to 80 mcg/min. Replace Ca++.      Intervention Category Major Interventions: Acid-Base disturbance - evaluation and management;Respiratory failure - evaluation and management  Lenell Antu 05/24/2021, 8:28 PM

## 2021-05-24 NOTE — Procedures (Signed)
Central Venous Catheter Insertion Procedure Note  Lydell Moga  [id number]  December 22, 1975  Date:05/24/21  Time:4:28 PM   Provider Performing:Brianni Manthe E Zelie Asbill   Procedure: Insertion of Non-tunneled Central Venous Catheter(36556)with US guidance (22297)    Indication(s) Hemodialysis  Consent Unable to obtain consent due to emergent nature of procedure.  Anesthesia Topical only with 1% lidocaine   Timeout Verified patient identification, verified procedure, site/side was marked, verified correct patient position, special equipment/implants available, medications/allergies/relevant history reviewed, required imaging and test results available.  Sterile Technique Maximal sterile technique including full sterile barrier drape, hand hygiene, sterile gown, sterile gloves, mask, hair covering, sterile ultrasound probe cover (if used).  Procedure Description Area of catheter insertion was cleaned with chlorhexidine and draped in sterile fashion.   With real-time ultrasound guidance a HD catheter was placed into the right internal jugular vein.  Guidewire placement verified by ultrasound x 2 views prior to dilation of vessel and placement of catheter. Nonpulsatile blood flow and easy flushing noted in all ports.  The catheter was sutured in place and sterile dressing applied.  Complications/Tolerance None; patient tolerated the procedure well. Chest X-ray is ordered to verify placement for internal jugular or subclavian cannulation and is pending.   EBL Minimal  Specimen(s) None    Tessie Fass MSN, AGACNP-BC [facility] Pulmonary/Critical Care Medicine Amion for pager  05/24/2021, 4:29 PM

## 2021-05-24 NOTE — ED Notes (Signed)
DR. Criss Alvine made aware of VBG results. ED-Lab.

## 2021-05-24 NOTE — Procedures (Signed)
Arterial Catheter Insertion Procedure Note  Tonnie Friedel  [id number]  10-03-1975  Date:05/24/21  Time:2:07 PM    Provider Performing: Dewain Penning T    Procedure: Insertion of Arterial Line (65681) without US guidance  Indication(s) Blood pressure monitoring and/or need for frequent ABGs  Consent Unable to obtain consent due to emergent nature of procedure.  Anesthesia None   Time Out Verified patient identification, verified procedure, site/side was marked, verified correct patient position, special equipment/implants available, medications/allergies/relevant history reviewed, required imaging and test results available.   Sterile Technique Maximal sterile technique including full sterile barrier drape, hand hygiene, sterile gown, sterile gloves, mask, hair covering, sterile ultrasound probe cover (if used).   Procedure Description Area of catheter insertion was cleaned with chlorhexidine and draped in sterile fashion. Without real-time ultrasound guidance an arterial catheter was placed into the right radial artery.  Appropriate arterial tracings confirmed on monitor.     Complications/Tolerance None; patient tolerated the procedure well.   EBL Minimal   Specimen(s) None

## 2021-05-24 NOTE — Progress Notes (Signed)
[facility] [place] Progress Note Patient Name: Andres Braun DOB: May 10, 1976 MRN: [id number]   Date of Service  05/24/2021  HPI/Events of Note  Anoxic myoclonus vs seizures -  Now resolved.   eICU Interventions  Plan: Will increase the ceiling on the Versed IV infusion to 10 mg/hour.      Intervention Category Major Interventions: Seizures - evaluation and management  Andres Braun Eugene 05/24/2021, 9:03 PM

## 2021-05-24 NOTE — Progress Notes (Signed)
Pt transported to CT and back to [facility] without complications. RT will continue to monitor.

## 2021-05-24 NOTE — Progress Notes (Signed)
Pt transported to 2H05 from the ED without complications.

## 2021-05-24 NOTE — H&P (Signed)
NAME:  Andres Braun, MRN:  [id number], DOB:  Oct 01, 1975, LOS: 0 ADMISSION DATE:  05/24/2021, CONSULTATION DATE:  05/24/21 REFERRING MD:  EDP, CHIEF COMPLAINT:  cardiac arrest   History of Present Illness:  46 y.o. man admitted after cardiac arrest at home.  History totally via EMR.  Patient is intubated and sedated.  No contact info or collateral information.  Patient reportedly out drinking with his friends last night.  No effort from in the morning.  Friends called EMS to his place of residence.  He was reportedly agonal he breathing.  Lost pulse.  20 minutes of CPR, 5 rounds of epinephrine.  ROSC was obtained.  Rhythm PEA per report.  Emesis noted all around him.  On arrival to ED, severely acidemic pH 6.7.  Refractory acidemia.  Difficult to ventilate.  Eventually requiring dose of paralytic after adequate sedation.  Labs notable for renal failure, elevated LFTs.  CK in the 14,000 range.  Pertinent  Medical History  Unknown  Significant Hospital Events: Including procedures, antibiotic start and stop dates in addition to other pertinent events     Interim History / Subjective:  As above  Objective   Blood pressure 109/72, pulse 86, temperature (!) 87.7 F (30.9 C), resp. rate 19, height 6' (1.829 m), SpO2 98 %.    Vent Mode: PRVC FiO2 (%):  [60 %-100 %] 60 % Set Rate:  [24 bmp-28 bmp] 28 bmp Vt Set:  [600 mL] 600 mL PEEP:  [5 cmH20] 5 cmH20 Plateau Pressure:  [26 cmH20] 26 cmH20   Intake/Output Summary (Last 24 hours) at 05/24/2021 1230 Last data filed at 05/24/2021 1156 Gross per 24 hour  Intake 2316.33 ml  Output --  Net 2316.33 ml   There were no vitals filed for this visit.  Examination: General: Intubated, sedated, bloody emesis around face HENT: Pupils 2 to 3 mm and reactive, no icterus Lungs: Coarse ventilated sounds, tachypneic Cardiovascular: Tachycardic, regular rhythm Abdomen: Nondistended, bowel sounds present Extremities: Warm, no edema Neuro:  Biting on tube, does not follow commands prior to sedative drips   Resolved Hospital Problem list     Assessment & Plan:  Cardiac arrest: Reported PEA arrest.  20 minutes of CPR, 5 doses of epi.  Emesis noted all over patient.  Suspect respiratory event, pneumonia, aspiration likely. --TTE  --given out of hospital arrest TTM not offered  Acute Hypoxemic Respiratory Failure: Due to cardiac arrest and likely aspiration event.  Very dyssynchronous on the vent, hard to ventilate initially. --PRVC --Ceftriaxone for pneumonia --VAP bundle --RASS -2 to -3, with as needed fentanyl and midazolam still very dyssynchronous although add RASS goal, therefore midazolam and fentanyl gtt. Added --vecuronium push required to achieve synchrony  Mixed metabolic and respiratory acidosis: Metabolic due to severe lactic acidosis, respiratory due to vent dyssynchrony. --Sodium bicarb drip 150 cc/h --Repeat blood gas once more synchronous  Acute renal failure: creatinine 2.7, urine output marginal.  Contributing to metabolic disturbances.  CK consistent with rhabdomyolysis. -- Nephrology consult, appreciate assistance --CK 14k --CRRT  Hypotension: In setting of cardiac arrest, hypothermia, metabolic acidosis, likely septic shock due to aspiration. --MAP goal greater than 65, vasopressors ordered and running  Bloody OG output: Possibly traumatic. --PPI IV twice daily  Elevated liver enzymes: AST 1100, ALT 1300.  T bili not Phos normal.  Suspect due to shock.  Likely related to rhabdomyolysis.   --Supportive care  Best Practice (right click and "Reselect all SmartList Selections" daily)   Diet/type: NPO DVT  prophylaxis: prophylactic heparin  GI prophylaxis: PPI Lines: N/A Foley:  Yes, and it is still needed Code Status:  full code Last date of multidisciplinary goals of care discussion n/a  Labs   CBC: Recent Labs  Lab 05/24/21 0916 05/24/21 0931 05/24/21 1051  WBC 9.6  --   --    NEUTROABS 5.2  --   --   HGB 15.9 16.7 17.0  HCT 53.1* 49.0 50.0  MCV 95.5  --   --   PLT 256  --   --     Basic Metabolic Panel: Recent Labs  Lab 05/24/21 0916 05/24/21 0931 05/24/21 1051  NA 145 143 141  K 2.6* 2.6* 2.5*  CL 112*  --   --   CO2 14*  --   --   GLUCOSE 77  --   --   BUN 17  --   --   CREATININE 2.74*  --   --   CALCIUM 8.2*  --   --    GFR: CrCl cannot be calculated (Unknown ideal weight.). Recent Labs  Lab 05/24/21 0916  WBC 9.6  LATICACIDVEN >9.0*    Liver Function Tests: Recent Labs  Lab 05/24/21 0916  AST 1,176*  ALT 1,329*  ALKPHOS 121  BILITOT 0.4  PROT 5.5*  ALBUMIN 3.0*   No results for input(s): LIPASE, AMYLASE in the last 168 hours. No results for input(s): AMMONIA in the last 168 hours.  ABG    Component Value Date/Time   PHART 6.834 (LL) 05/24/2021 1051   PCO2ART 61.4 (H) 05/24/2021 1051   PO2ART 135 (H) 05/24/2021 1051   HCO3 10.3 (L) 05/24/2021 1051   TCO2 12 (L) 05/24/2021 1051   ACIDBASEDEF 25.0 (H) 05/24/2021 1051   O2SAT 95.0 05/24/2021 1051     Coagulation Profile: Recent Labs  Lab 05/24/21 0916  INR 1.2    Cardiac Enzymes: No results for input(s): CKTOTAL, CKMB, CKMBINDEX, TROPONINI in the last 168 hours.  HbA1C: No results found for: HGBA1C  CBG: Recent Labs  Lab 05/24/21 0916 05/24/21 1138  GLUCAP 76 59*    Review of Systems:   Unable to obtain due to patient factors, sedated, ventilated, postcardiac arrest  Past Medical History:  He,  has no past medical history on file.  Unable to obtain medical history due to patient factors as above  Surgical History:  Unable to obtain history due to patient factors as above  Social History:  Unable to obtain history due to patient factors as above  Family History:  Unable to obtain due to patient factors as above Allergies Not on File   Home Medications  Prior to Admission medications   Not on File     Critical care time:    CRITICAL  CARE Performed by: Karren Burly   Total critical care time: 70 minutes  Critical care time was exclusive of separately billable procedures and treating other patients.  Critical care was necessary to treat or prevent imminent or life-threatening deterioration.  Critical care was time spent personally by me on the following activities: development of treatment plan with patient and/or surrogate as well as nursing, discussions with consultants, evaluation of patient's response to treatment, examination of patient, obtaining history from patient or surrogate, ordering and performing treatments and interventions, ordering and review of laboratory studies, ordering and review of radiographic studies, pulse oximetry and re-evaluation of patient's condition.   Karren Burly, MD See Loretha Stapler for contact info

## 2021-05-24 NOTE — Progress Notes (Signed)
°  Echocardiogram 2D Echocardiogram has been performed.  Delcie Roch 05/24/2021, 12:12 PM

## 2021-05-24 NOTE — Procedures (Signed)
Central Venous Catheter Insertion Procedure Note  Andres Braun  [id number]  27-Dec-1975  Date:05/24/21  Time:2:52 PM   Provider Performing:Mavryk Pino R Trinidee Schrag   Procedure: Insertion of Non-tunneled Central Venous Catheter(36556) with US guidance (38101)   Indication(s) Medication administration  Consent Unable to obtain consent due to emergent nature of procedure.  Anesthesia Topical only with 1% lidocaine   Timeout Verified patient identification, verified procedure, site/side was marked, verified correct patient position, special equipment/implants available, medications/allergies/relevant history reviewed, required imaging and test results available.  Sterile Technique Maximal sterile technique including full sterile barrier drape, hand hygiene, sterile gown, sterile gloves, mask, hair covering, sterile ultrasound probe cover (if used).  Procedure Description Area of catheter insertion was cleaned with chlorhexidine and draped in sterile fashion.  With real-time ultrasound guidance a central venous catheter was placed into the left internal jugular vein. Nonpulsatile blood flow and easy flushing noted in all ports.  The catheter was sutured in place and sterile dressing applied.  Complications/Tolerance None; patient tolerated the procedure well. Chest X-ray is ordered to verify placement for internal jugular or subclavian cannulation.   Chest x-ray is not ordered for femoral cannulation.  EBL Minimal  Specimen(s) None

## 2021-05-24 NOTE — Consult Note (Signed)
Renal Service Consult Note [facility]  Andres Braun 05/24/2021 [name], MD Requesting Physician: Dr. Judeth Horn  Reason for Consult: Acute renal failure HPI: The patient is a 46 y.o. year-old w/ no PMH found down by friends this morning. They were out drinking last night and did not hear from him this morning, went to his house where he was found in his bed covered w/ vomit and w/ agonal respirations. EMS called and found the pt to be in asystole, got CPR for 20- 25 mins w/ 5 epi then ROSC. Required epi gtt and D10 infusion for hypoglycemia. Intubated, cold, occasional  spontaneous respirations, otherwise no purposeful movement. Labs showed creatinine 2.7, ^AST/ ALT 1000 range, low K+, severe acidosis w/ hypercarbia, LA > 9, ABG pH 6.83.  Asked to see for possible need for CRRT.   Pt seen in ED. No family in room, pt not responsive.     ROS - n/a   Past Medical History No past medical history on file. Past Surgical History  Family History No family history on file. Social History  has no history on file for tobacco use, alcohol use, and drug use. Allergies Not on File Home medications Prior to Admission medications   Not on File     Vitals:   05/24/21 1145 05/24/21 1204 05/24/21 1206 05/24/21 1209  BP: 102/81 107/83 109/69 109/72  Pulse: 78 87 86 86  Resp: (!) 23 20 18 19   Temp: (!) 86.2 F (30.1 C) (!) 87.5 F (30.8 C) (!) 87.5 F (30.8 C) (!) 87.7 F (30.9 C)  SpO2: 91% 97% 97% 98%  Height:       Exam Gen on vent, sedated, temp 87 deg HR 88, BP 90-105/ 66-81 No rash, cyanosis or gangrene Sclera anicteric, throat w/ ETT OG tube draining dark red blood No jvd or bruits, flat neck veins Chest clear anterior/ lateral RRR no MRG Abd soft ntnd no mass or ascites +bs GU normal male  MS no joint effusions or deformity Ext no UE or LE edema, no wounds or ulcers Neuro is on vent, sedated      Home meds include - none noted yet    ABG  6.83/ 61/ 135      Na 145  K 3.6  Ci 112  CO 14  BUN 17  Cr 2.74  Ca 8.2  AG 19 Alb 3.0   AST 1176  ALT 1329  tbili 0.4   LA > 9.0  WBC 9K  Hb 15       UA neg protein, rare bact, 0-5 rbc/ wbc     Urine tox + cocaine     CXR 1/15 - IMPRESSION: 1. Well-positioned endotracheal and enteric tubes. 2. Mild diffuse prominence of the central interstitial markings, suggesting mild pulmonary edema.   Assessment/ Plan: Renal failure - AKI w/ creat 2.7 on admission. Pt found down w/ ^AST/ ALT, suspect rhabdomyolysis. Very sick acutely. Gave another 2 L NS bolus. Agree w/ CRRT due to refractory metabolic acidosis. See orders.  SP cardiac arrest - asystolic arrest in setting of prob asp PNA/ ARDS Severe metabolic/ resp acidosis - cont bicarb gtt at 150 cc/hr AHRF - suspected aspiration pneumonitis w/ ARDS, difficult to ventilate. Getting full vent support and empiric IV Rocephin.  Hypothermia - per CCM      [name]  MD 05/24/2021, 12:21 PM  Recent Labs  Lab 05/24/21 0916 05/24/21 0931 05/24/21 1051  WBC 9.6  --   --  HGB 15.9 16.7 17.0   Recent Labs  Lab 05/24/21 0916 05/24/21 0931 05/24/21 1051  K 2.6* 2.6* 2.5*  BUN 17  --   --   CREATININE 2.74*  --   --   CALCIUM 8.2*  --   --

## 2021-05-24 NOTE — Progress Notes (Signed)
[facility] referral number:  [id number] Spoke with Andres Braun

## 2021-05-24 NOTE — ED Provider Notes (Addendum)
[facility] EMERGENCY [facility]PARTMENT Provider Note   CSN: [id number] Arrival date & time: 05/24/21  [id number]     History  Chief Complaint  Patient presents with   Cardiac Arrest    Andres Braun is a 46 y.o. male.  HPI 46 year old male presents via EMS for unresponsiveness and CPR.  History is from the paramedics who initially evaluated and treated patient.  No further history is available at this time.  The patient reportedly went drinking with friends last night and that was the last time I saw him.  They had not heard from this morning so they went to check on him and found him unresponsive in his living place.  EMS notes there were multiple alcohol bottles around.  He was covered in emesis.  He was having minimal/agonal respirations and shortly thereafter he coded in front of EMS.  They did about 25 minutes of CPR with 5 epinephrines.  Initial CBG was 75 and then went down to 45 so he was put on D10.  EMS also reports that his teeth were clenched early on in the initial resuscitation. He did not receive any RSI meds. EMS also noted his room was very cold, and there were space heaters but they estimated the temperature to be in the low 60s.   Home Medications Prior to Admission medications   Not on File      Allergies    Patient has no allergy information on record.    Review of Systems   Review of Systems  Unable to perform ROS: Patient unresponsive   Physical Exam Updated Vital Signs BP 91/66    Pulse 65    Temp (!) 84.1 F (28.9 C)    Resp (!) 28    Ht 6' (1.829 m)    SpO2 95%  Physical Exam Vitals and nursing note reviewed.  Constitutional:      Appearance: He is well-developed.     Interventions: He is intubated.  HENT:     Head: Normocephalic and atraumatic.  Eyes:     Comments: Pupils are midsize and sluggish but I think mildly reactive to light  Cardiovascular:     Rate and Rhythm: Normal rate and regular rhythm.     Pulses:           Femoral pulses are 2+ on the right side and 2+ on the left side.      Popliteal pulses are 2+ on the right side and 2+ on the left side.     Heart sounds: Normal heart sounds.     Comments: I cannot get any pulses distal to brachial or popliteal bilaterally.  He has 2+ brachial pulses bilaterally Pulmonary:     Effort: He is intubated.     Comments: Patient is intubated and ventilated via RT.  However he does occasionally make some spontaneous rep respirations, estimated about 5/min Abdominal:     General: There is no distension.     Palpations: Abdomen is soft.  Skin:    General: Skin is dry.     Comments: Cool extremities.  I am having a hard time getting distal pulses in all 4 extremities.  Neurological:     Mental Status: He is unresponsive.     GCS: GCS eye subscore is 1. GCS verbal subscore is 1. GCS motor subscore is 1.    ED Results / Procedures / Treatments   Labs (all labs ordered are listed, but only abnormal results are displayed) Labs Reviewed  SALICYLATE LEVEL - Abnormal; Notable for the following components:      Result Value   Salicylate Lvl <7.0 (*)    All other components within normal limits  LACTIC ACID, PLASMA - Abnormal; Notable for the following components:   Lactic Acid, Venous >9.0 (*)    All other components within normal limits  ETHANOL - Abnormal; Notable for the following components:   Alcohol, Ethyl (B) 44 (*)    All other components within normal limits  COMPREHENSIVE METABOLIC PANEL - Abnormal; Notable for the following components:   Potassium 2.6 (*)    Chloride 112 (*)    CO2 14 (*)    Creatinine, Ser 2.74 (*)    Calcium 8.2 (*)    Total Protein 5.5 (*)    Albumin 3.0 (*)    AST 1,176 (*)    ALT 1,329 (*)    GFR, Estimated 28 (*)    Anion gap 19 (*)    All other components within normal limits  ACETAMINOPHEN LEVEL - Abnormal; Notable for the following components:   Acetaminophen (Tylenol), Serum <10 (*)    All other components within  normal limits  URINALYSIS, ROUTINE W REFLEX MICROSCOPIC - Abnormal; Notable for the following components:   Specific Gravity, Urine <1.005 (*)    Hgb urine dipstick TRACE (*)    All other components within normal limits  RAPID URINE DRUG SCREEN, HOSP PERFORMED - Abnormal; Notable for the following components:   Cocaine POSITIVE (*)    All other components within normal limits  CBC WITH DIFFERENTIAL/PLATELET - Abnormal; Notable for the following components:   HCT 53.1 (*)    MCHC 29.9 (*)    nRBC 0.6 (*)    Abs Immature Granulocytes 0.21 (*)    All other components within normal limits  URINALYSIS, MICROSCOPIC (REFLEX) - Abnormal; Notable for the following components:   Bacteria, UA RARE (*)    All other components within normal limits  I-STAT VENOUS BLOOD GAS, ED - Abnormal; Notable for the following components:   pH, Ven 6.752 (*)    pCO2, Ven 92.9 (*)    pO2, Ven 69.0 (*)    Bicarbonate 13.0 (*)    TCO2 16 (*)    Acid-base deficit 25.0 (*)    Potassium 2.6 (*)    Calcium, Ion 1.14 (*)    All other components within normal limits  I-STAT ARTERIAL BLOOD GAS, ED - Abnormal; Notable for the following components:   pH, Arterial 6.834 (*)    pCO2 arterial 61.4 (*)    pO2, Arterial 135 (*)    Bicarbonate 10.3 (*)    TCO2 12 (*)    Acid-base deficit 25.0 (*)    Potassium 2.5 (*)    All other components within normal limits  TROPONIN I (HIGH SENSITIVITY) - Abnormal; Notable for the following components:   Troponin I (High Sensitivity) 475 (*)    All other components within normal limits  RESP PANEL BY RT-PCR (FLU A&B, COVID) ARPGX2  PROTIME-INR  LACTIC ACID, PLASMA  MAGNESIUM  CK  HIV ANTIBODY (ROUTINE TESTING W REFLEX)  CBC  CREATININE, SERUM  COMPREHENSIVE METABOLIC PANEL  CBG MONITORING, ED  CBG MONITORING, ED  TYPE AND SCREEN  ABO/RH  TROPONIN I (HIGH SENSITIVITY)    EKG EKG Interpretation  Date/Time:  Sunday May 24 2021 09:19:51 EST Ventricular Rate:   61 PR Interval:  237 QRS Duration: 184 QT Interval:  574 QTC Calculation: 579 R Axis:   44 Text Interpretation:  Sinus rhythm Prolonged PR interval Left bundle branch block Osborn waves due to hypothermia No old tracing to compare Confirmed by Pricilla LovelessGoldston, [address] [phone]([id number]) on 05/24/2021 9:31:38 AM  Radiology DG Abdomen 1 View  Result Date: 05/24/2021 CLINICAL DATA:  Status post CPR, enteric tube placement EXAM: ABDOMEN - 1 VIEW COMPARISON:  None. FINDINGS: Enteric tube terminates in the gastric fundus. No dilated small bowel loops. Mild-to-moderate colonic gas and stool. No evidence of pneumatosis or pneumoperitoneum. No radiopaque stones overlying the kidneys. IMPRESSION: Enteric tube terminates in the gastric fundus. Nonobstructive bowel gas pattern Electronically Signed   By: Delbert PhenixJason A Poff M.D.   On: 05/24/2021 09:59   CT Head Wo Contrast  Result Date: 05/24/2021 CLINICAL DATA:  Patient found unresponsive this morning. CPR for 20 minutes. EXAM: CT HEAD WITHOUT CONTRAST TECHNIQUE: Contiguous axial images were obtained from the base of the skull through the vertex without intravenous contrast. RADIATION DOSE REDUCTION: This exam was performed according to the departmental dose-optimization program which includes automated exposure control, adjustment of the mA and/or kV according to patient size and/or use of iterative reconstruction technique. COMPARISON:  None. FINDINGS: Brain: No evidence of acute infarction, hemorrhage, hydrocephalus, extra-axial collection or mass lesion/mass effect. Vascular: No hyperdense vessel or unexpected calcification. Skull: Normal. Negative for fracture or focal lesion. Sinuses/Orbits: Globes and orbits are unremarkable. Dependent fluid in the sphenoid posterior right ethmoid air cells and right maxillary sinus. Scattered mucosal thickening. Patient is intubated. Other: None. IMPRESSION: 1. No intracranial abnormalities. Electronically Signed   By: Amie Portlandavid  Ormond M.D.   On:  05/24/2021 10:29   DG Chest Portable 1 View  Result Date: 05/24/2021 CLINICAL DATA:  Post CPR comb intubated EXAM: PORTABLE CHEST 1 VIEW COMPARISON:  None. FINDINGS: Endotracheal tube tip is 3.4 cm above the carina. Enteric tube terminates in the gastric fundus. Pacer pad overlies the left heart. Normal heart size and normal mediastinal contour. No pneumothorax. No pleural effusion. Mild diffuse prominence of the central interstitial markings. No consolidative airspace disease. IMPRESSION: 1. Well-positioned endotracheal and enteric tubes. 2. Mild diffuse prominence of the central interstitial markings, suggesting mild pulmonary edema. Electronically Signed   By: Delbert PhenixJason A Poff M.D.   On: 05/24/2021 09:57    Procedures .Critical Care Performed by: Pricilla LovelessGoldston, Devra Stare, MD Authorized by: Pricilla LovelessGoldston, Arthur Aydelotte, MD   Critical care provider statement:    Critical care time (minutes):  50   Critical care time was exclusive of:  Separately billable procedures and treating other patients   Critical care was necessary to treat or prevent imminent or life-threatening deterioration of the following conditions:  Shock, respiratory failure and circulatory failure   Critical care was time spent personally by me on the following activities:  Development of treatment plan with patient or surrogate, discussions with consultants, evaluation of patient's response to treatment, examination of patient, ordering and review of laboratory studies, ordering and review of radiographic studies, ordering and performing treatments and interventions, pulse oximetry, re-evaluation of patient's condition, review of old charts and ventilator management    Medications Ordered in ED Medications  0.9 %  sodium chloride infusion (250 mLs Intravenous New Bag/Given 05/24/21 0956)  norepinephrine (LEVOPHED) 4mg  in 250mL (0.016 mg/mL) premix infusion (7 mcg/min Intravenous Rate/Dose Change 05/24/21 1047)  fentaNYL (SUBLIMAZE) injection 100 mcg (has  no administration in time range)  midazolam (VERSED) injection 2 mg (has no administration in time range)  sodium bicarbonate 150 mEq in dextrose 5 % 1,150 mL infusion ( Intravenous Rate/Dose Change 05/24/21 1110)  potassium  chloride 10 mEq in 100 mL IVPB (10 mEq Intravenous New Bag/Given 05/24/21 1056)  docusate sodium (COLACE) capsule 100 mg (has no administration in time range)  polyethylene glycol (MIRALAX / GLYCOLAX) packet 17 g (has no administration in time range)  heparin injection 5,000 Units (has no administration in time range)  pantoprazole (PROTONIX) injection 40 mg (has no administration in time range)  albuterol (PROVENTIL) (2.5 MG/3ML) 0.083% nebulizer solution 2.5 mg (has no administration in time range)  cefTRIAXone (ROCEPHIN) 1 g in sodium chloride 0.9 % 100 mL IVPB (has no administration in time range)  lactated ringers bolus 1,000 mL (0 mLs Intravenous Stopped 05/24/21 1021)  fentaNYL (SUBLIMAZE) 100 MCG/2ML injection (  Given 05/24/21 0945)  potassium chloride 10 mEq in 100 mL IVPB (0 mEq Intravenous Stopped 05/24/21 1056)  lactated ringers bolus 1,000 mL (1,000 mLs Intravenous New Bag/Given 05/24/21 1110)    ED Course/ Medical Decision Making/ A&P Clinical Course as of 05/24/21 1121  Sun May 24, 2021  0937 pH 6.7 with respiratory and metabolic acidosis. We have increased his rate on the vent from 15 set by RT to 24. We will also start bicarb drip. I have consulted ICU, Delorise Shiner, who will come eval as well [SG]  0945 I discussed with Dr. Judeth Horn, who is at the bedside.  There is some concern that his hypothermia will correct with warming though he seems to be far off from getting adequately warmed.  Thus we will replace his potassium for now and keep checking metabolic panels to make sure its not over correcting.  He advises 40 mEq replaced (can't do PO based on continued NG output [SG]    Clinical Course User Index [SG] Pricilla Loveless, MD                           Medical  Decision Making  Patient presents status post cardiac arrest.  He has no significant past medical history and no past chart to review.  He is currently on an epi drip from EMS which was switched over to norepinephrine.  He has needed this with varying degrees of effectiveness to keep his MAP over 65.  He was also given some IV fluids.  GCS is 3 on arrival.  ETCO2 detector confirmed ETT placement from EMS. He has adequate IV access with 2 18s.   He is very hypothermic and was started on the [employer]  ECG has been personally reviewed and interpreted and shows Earl Gala waves consistent with his severe hypothermia.  Patient's head CT and CXR were personally reviewed and interpreted. No emergent head CT abnormalities. ETT in good position on my view.  He has multiple lab abnormalities on my interpretation/evaluation.  This includes a lactate greater than 9.  His initial blood gas shows a pH of 6.7 that is both respiratory and metabolic.  His ventilator was adjusted to increase his respiratory rate.  He also has kidney failure that is presumably acute along with liver failure that is probably from shock/cardiac arrest state.  He has hypokalemia, I suspect this could be from the hypothermia that is very severe and as above we will slowly correct and reevaluate.  Patient has been placed on a bicarbonate drip with glucose given the hypoglycemia noted with EMS.  Overall patient is a very complex patient and in critical condition.  There is no current pain here.  He is on multiple drips including pressors.  He will be admitted to  the ICU service.        Final Clinical Impression(s) / ED Diagnoses Final diagnoses:  Cardiac arrest (HCC)  Acute respiratory failure with hypoxia and hypercapnia (HCC)  Hypothermia, initial encounter  Shock [facility])    Rx / DC Orders ED Discharge Orders     None         Pricilla Loveless, MD 05/24/21 1200    Pricilla Loveless, MD 05/24/21 1201

## 2021-05-24 NOTE — Progress Notes (Signed)
Pt's friend who brought him to hospital called to check on pt.  Friend states pt has a wife and cousin who live in [place].  Pt's friend did not know contact info but will attempt to find out.

## 2021-05-24 NOTE — Progress Notes (Signed)
[facility] Progress Note Patient Name: Andres Braun DOB: July 18, 1975 MRN: [id number]   Date of Service  05/24/2021  HPI/Events of Note  ABG on 100%/PRVC 32/TV 620/P 5 = 7.216/48.2/97/19.5. 2. Hyperkalemia - K+ = 5.6. 3. Hypocalcemia - ionized Ca++ = 0.92.   eICU Interventions  Plan: NaHCO3 100 meq IV X 1.  Replace Ca++. Lokelma 10 gm per tube now.  Repeat ABG and BMP in AM already ordered.      Intervention Category Major Interventions: Electrolyte abnormality - evaluation and management;Acid-Base disturbance - evaluation and management;Respiratory failure - evaluation and management  Lysle Dingwall 05/24/2021, 11:53 PM

## 2021-05-24 NOTE — ED Triage Notes (Signed)
Pt from home via EMS, found unresponsive in his bed with agonal respirations by friends. Out drinking with friends last night and they became concerned when they did not hear from him this morning so went to his house and called 911 when they found him in the above condition. Initial rhythm asystole, CPR x 20 mins with 5 epi with ROSC at 0832. Remains on epi drip and D10 infusion d/t hypoglycemia after ROSC. Intubated with ETT, 27" at lip, occasional spontaneous respirations, otherwise no purposeful movement.

## 2021-05-25 ENCOUNTER — Inpatient Hospital Stay (HOSPITAL_COMMUNITY): Payer: Self-pay

## 2021-05-25 DIAGNOSIS — R579 Shock, unspecified: Secondary | ICD-10-CM

## 2021-05-25 DIAGNOSIS — J9601 Acute respiratory failure with hypoxia: Secondary | ICD-10-CM | POA: Insufficient documentation

## 2021-05-25 DIAGNOSIS — N179 Acute kidney failure, unspecified: Secondary | ICD-10-CM | POA: Diagnosis present

## 2021-05-25 DIAGNOSIS — G9341 Metabolic encephalopathy: Secondary | ICD-10-CM | POA: Diagnosis present

## 2021-05-25 DIAGNOSIS — A419 Sepsis, unspecified organism: Secondary | ICD-10-CM | POA: Diagnosis present

## 2021-05-25 LAB — BASIC METABOLIC PANEL
Anion gap: 15 (ref 5–15)
BUN: 28 mg/dL — ABNORMAL HIGH (ref 6–20)
CO2: 21 mmol/L — ABNORMAL LOW (ref 22–32)
Calcium: 6.2 mg/dL — CL (ref 8.9–10.3)
Chloride: 100 mmol/L (ref 98–111)
Creatinine, Ser: 3.18 mg/dL — ABNORMAL HIGH (ref 0.61–1.24)
GFR, Estimated: 24 mL/min — ABNORMAL LOW (ref >60)
Glucose, Bld: 162 mg/dL — ABNORMAL HIGH (ref 70–99)
Potassium: 5 mmol/L (ref 3.5–5.1)
Sodium: 136 mmol/L (ref 135–145)

## 2021-05-25 LAB — RENAL FUNCTION PANEL
Albumin: 2.1 g/dL — ABNORMAL LOW (ref 3.5–5.0)
Anion gap: 16 — ABNORMAL HIGH (ref 5–15)
BUN: 38 mg/dL — ABNORMAL HIGH (ref 6–20)
CO2: 23 mmol/L (ref 22–32)
Calcium: 5.6 mg/dL — CL (ref 8.9–10.3)
Chloride: 100 mmol/L (ref 98–111)
Creatinine, Ser: 4.27 mg/dL — ABNORMAL HIGH (ref 0.61–1.24)
GFR, Estimated: 17 mL/min — ABNORMAL LOW (ref >60)
Glucose, Bld: 143 mg/dL — ABNORMAL HIGH (ref 70–99)
Phosphorus: 6.6 mg/dL — ABNORMAL HIGH (ref 2.5–4.6)
Potassium: 5.9 mmol/L — ABNORMAL HIGH (ref 3.5–5.1)
Sodium: 139 mmol/L (ref 135–145)

## 2021-05-25 LAB — CBC
HCT: 42.1 % (ref 39.0–52.0)
Hemoglobin: 13.9 g/dL (ref 13.0–17.0)
MCH: 28.6 pg (ref 26.0–34.0)
MCHC: 33 g/dL (ref 30.0–36.0)
MCV: 86.6 fL (ref 80.0–100.0)
Platelets: 111 10*3/uL — ABNORMAL LOW (ref 150–400)
RBC: 4.86 MIL/uL (ref 4.22–5.81)
RDW: 13.1 % (ref 11.5–15.5)
WBC: 9.9 10*3/uL (ref 4.0–10.5)
nRBC: 0.2 % (ref 0.0–0.2)

## 2021-05-25 LAB — POCT I-STAT 7, (LYTES, BLD GAS, ICA,H+H)
Acid-base deficit: 6 mmol/L — ABNORMAL HIGH (ref 0.0–2.0)
Acid-base deficit: 3 mmol/L — ABNORMAL HIGH (ref 0.0–2.0)
Acid-base deficit: 2 mmol/L (ref 0.0–2.0)
Bicarbonate: 22.4 mmol/L (ref 20.0–28.0)
Bicarbonate: 24.5 mmol/L (ref 20.0–28.0)
Bicarbonate: 26.1 mmol/L (ref 20.0–28.0)
Calcium, Ion: 0.91 mmol/L — ABNORMAL LOW (ref 1.15–1.40)
Calcium, Ion: 0.88 mmol/L — CL (ref 1.15–1.40)
Calcium, Ion: 0.87 mmol/L — CL (ref 1.15–1.40)
HCT: 43 % (ref 39.0–52.0)
HCT: 37 % — ABNORMAL LOW (ref 39.0–52.0)
HCT: 37 % — ABNORMAL LOW (ref 39.0–52.0)
Hemoglobin: 14.6 g/dL (ref 13.0–17.0)
Hemoglobin: 12.6 g/dL — ABNORMAL LOW (ref 13.0–17.0)
Hemoglobin: 12.6 g/dL — ABNORMAL LOW (ref 13.0–17.0)
O2 Saturation: 86 %
O2 Saturation: 95 %
O2 Saturation: 92 %
Patient temperature: 38.9
Patient temperature: 38.6
Potassium: 5 mmol/L (ref 3.5–5.1)
Potassium: 5 mmol/L (ref 3.5–5.1)
Potassium: 5.2 mmol/L — ABNORMAL HIGH (ref 3.5–5.1)
Sodium: 135 mmol/L (ref 135–145)
Sodium: 135 mmol/L (ref 135–145)
Sodium: 135 mmol/L (ref 135–145)
TCO2: 24 mmol/L (ref 22–32)
TCO2: 26 mmol/L (ref 22–32)
TCO2: 28 mmol/L (ref 22–32)
pCO2 arterial: 53.1 mmHg — ABNORMAL HIGH (ref 32.0–48.0)
pCO2 arterial: 55.7 mmHg — ABNORMAL HIGH (ref 32.0–48.0)
pCO2 arterial: 62.8 mmHg — ABNORMAL HIGH (ref 32.0–48.0)
pH, Arterial: 7.233 — ABNORMAL LOW (ref 7.350–7.450)
pH, Arterial: 7.262 — ABNORMAL LOW (ref 7.350–7.450)
pH, Arterial: 7.234 — ABNORMAL LOW (ref 7.350–7.450)
pO2, Arterial: 61 mmHg — ABNORMAL LOW (ref 83.0–108.0)
pO2, Arterial: 93 mmHg (ref 83.0–108.0)
pO2, Arterial: 84 mmHg (ref 83.0–108.0)

## 2021-05-25 LAB — GLUCOSE, CAPILLARY
Glucose-Capillary: 132 mg/dL — ABNORMAL HIGH (ref 70–99)
Glucose-Capillary: 143 mg/dL — ABNORMAL HIGH (ref 70–99)
Glucose-Capillary: 65 mg/dL — ABNORMAL LOW (ref 70–99)

## 2021-05-25 LAB — CK: Total CK: >50000 U/L — ABNORMAL HIGH (ref 49–397)

## 2021-05-25 LAB — LACTIC ACID, PLASMA
Lactic Acid, Venous: 4.2 mmol/L (ref 0.5–1.9)
Lactic Acid, Venous: 4.9 mmol/L (ref 0.5–1.9)

## 2021-05-25 LAB — PHOSPHORUS
Phosphorus: 5.9 mg/dL — ABNORMAL HIGH (ref 2.5–4.6)
Phosphorus: 6.2 mg/dL — ABNORMAL HIGH (ref 2.5–4.6)
Phosphorus: 6.6 mg/dL — ABNORMAL HIGH (ref 2.5–4.6)

## 2021-05-25 LAB — CORTISOL: Cortisol, Plasma: 29.6 ug/dL

## 2021-05-25 LAB — MAGNESIUM
Magnesium: 1.6 mg/dL — ABNORMAL LOW (ref 1.7–2.4)
Magnesium: 1.6 mg/dL — ABNORMAL LOW (ref 1.7–2.4)
Magnesium: 1.8 mg/dL (ref 1.7–2.4)

## 2021-05-25 LAB — ALBUMIN: Albumin: 2.2 g/dL — ABNORMAL LOW (ref 3.5–5.0)

## 2021-05-25 LAB — APTT: aPTT: 36 seconds (ref 24–36)

## 2021-05-25 IMAGING — DX DG CHEST 1V PORT
1 series · 1 of 1 positions shown · non-contrast
Comparison: Chest x-ray [DATE]

CLINICAL DATA: Respiratory failure

EXAM:
PORTABLE CHEST 1 VIEW

[chest]
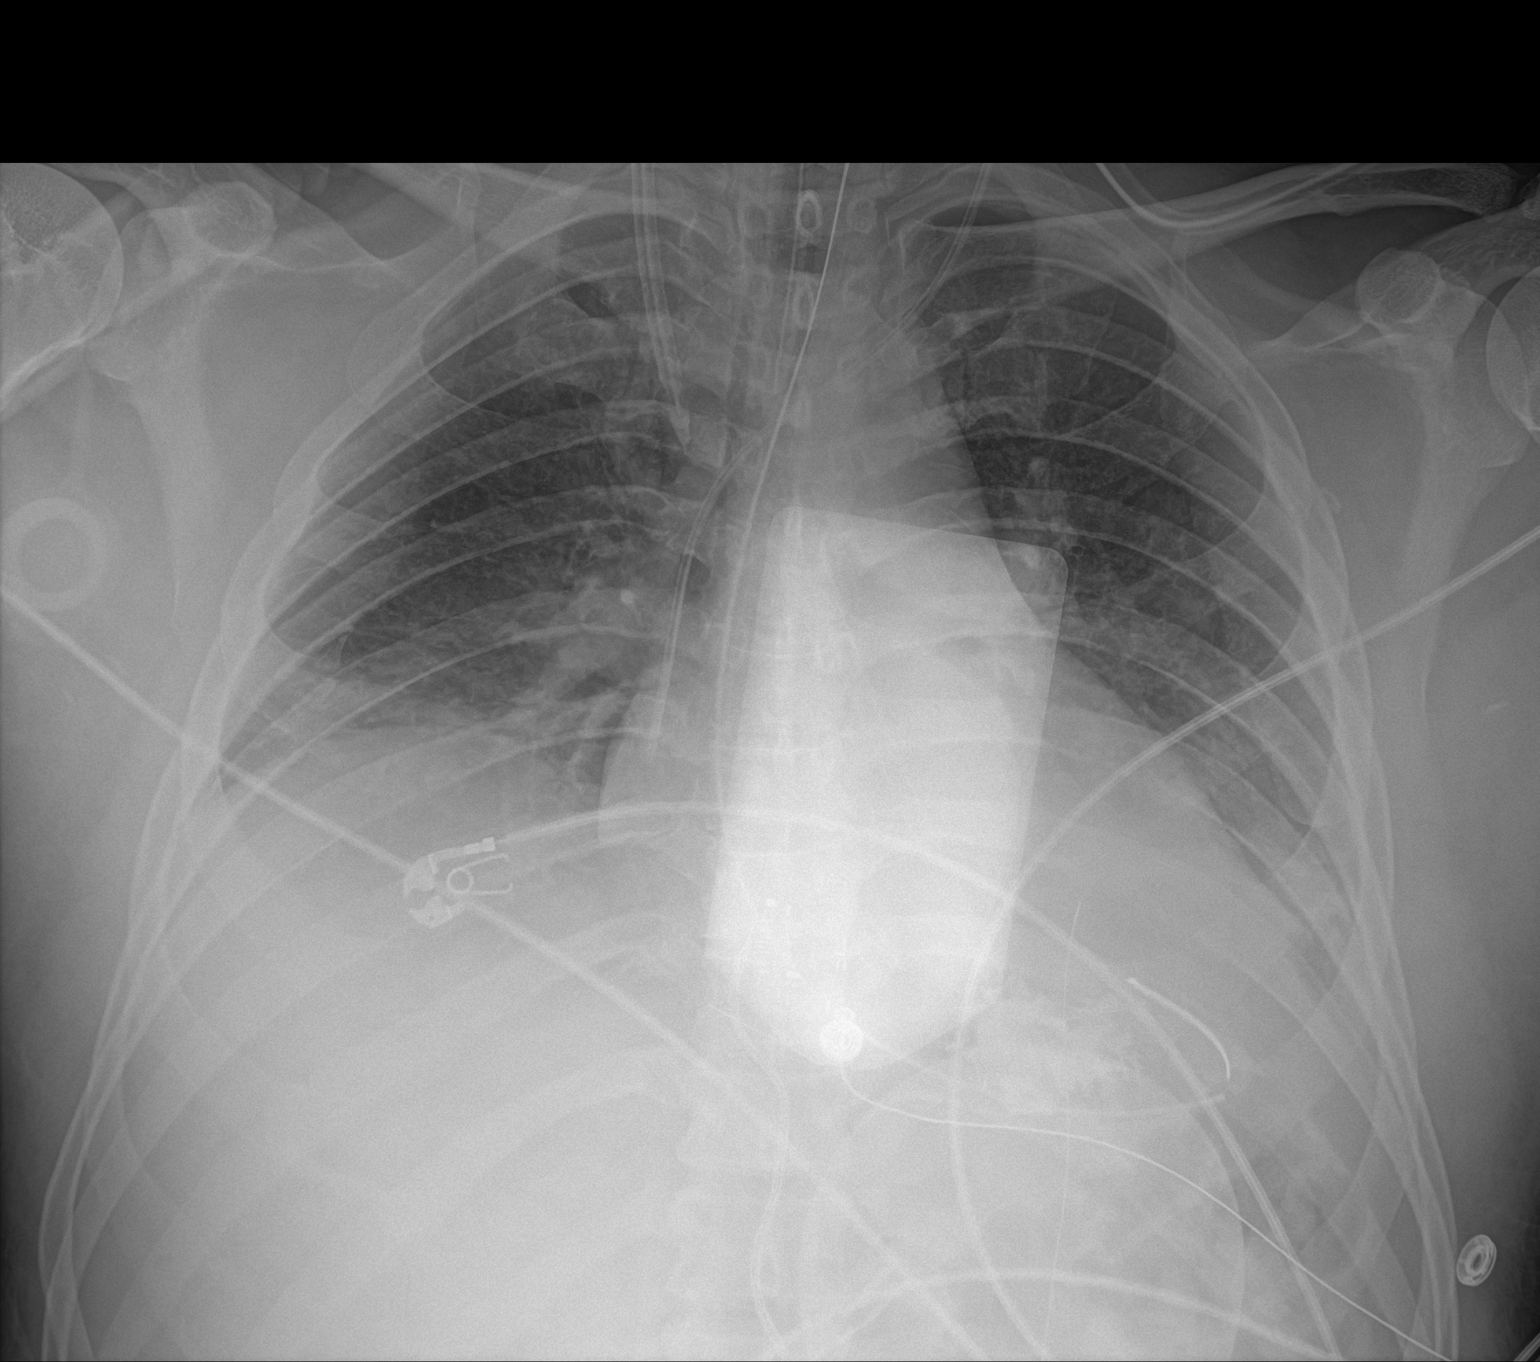

[1 of 1 positions shown; findings below may reference images not displayed]

FINDINGS: Endotracheal tube tip is 3 cm above the carina. Enteric tube tip is
in the stomach. Bilateral internal jugular lines are stable.

Heart size is normal. Mediastinum appears stable and within normal
limits. Low lung volumes with increased opacities at the lung bases
since previous study. No large pleural effusion visualized. No
pneumothorax.
IMPRESSION: 1. Medical devices as described.
2. Low lung volumes with increased opacities at the lung bases which
likely represent atelectasis.

## 2021-05-25 MED ORDER — BUSPIRONE HCL 10 MG PO TABS: 30.0000 mg | ORAL_TABLET | Freq: Three times a day (TID) | ORAL | Status: AC | PRN

## 2021-05-25 MED ORDER — ACETAMINOPHEN 650 MG RE SUPP: 650.0000 mg | RECTAL | Status: AC

## 2021-05-25 MED ORDER — ACETAMINOPHEN 325 MG PO TABS
650.0000 mg | ORAL_TABLET | ORAL | Status: DC | PRN
  Administered 2021-05-29: 650 mg via ORAL
  Filled 2021-05-25: qty 2

## 2021-05-25 MED ORDER — DOCUSATE SODIUM 50 MG/5ML PO LIQD
100.0000 mg | Freq: Two times a day (BID) | ORAL | Status: DC
  Administered 2021-05-25 – 2021-05-28 (×6): 100 mg
  Filled 2021-05-25 (×7): qty 10

## 2021-05-25 MED ORDER — CHLORHEXIDINE GLUCONATE 0.12% ORAL RINSE (MEDLINE KIT)
15.0000 mL | Freq: Two times a day (BID) | OROMUCOSAL | Status: DC
  Administered 2021-05-25 – 2021-05-29 (×8): 15 mL via OROMUCOSAL

## 2021-05-25 MED ORDER — FOLIC ACID 1 MG PO TABS
1.0000 mg | ORAL_TABLET | Freq: Every day | ORAL | Status: DC
  Administered 2021-05-26 – 2021-07-07 (×42): 1 mg
  Filled 2021-05-25 (×41): qty 1

## 2021-05-25 MED ORDER — PHENYLEPHRINE CONCENTRATED 100MG/250ML (0.4 MG/ML) INFUSION SIMPLE
0.0000 ug/min | INTRAVENOUS | Status: DC
  Administered 2021-05-25: 200 ug/min via INTRAVENOUS
  Administered 2021-05-25: 220 ug/min via INTRAVENOUS
  Administered 2021-05-26: 80 ug/min via INTRAVENOUS
  Filled 2021-05-25 (×4): qty 250

## 2021-05-25 MED ORDER — ACETAMINOPHEN 160 MG/5ML PO SOLN
650.0000 mg | ORAL | Status: AC
  Administered 2021-05-25 – 2021-05-26 (×8): 650 mg
  Filled 2021-05-25 (×7): qty 20.3

## 2021-05-25 MED ORDER — B COMPLEX-C PO TABS
1.0000 | ORAL_TABLET | Freq: Every day | ORAL | Status: DC
  Administered 2021-05-26 – 2021-05-31 (×5): 1
  Filled 2021-05-25 (×8): qty 1

## 2021-05-25 MED ORDER — VITAL 1.5 CAL PO LIQD
1000.0000 mL | ORAL | Status: DC
  Administered 2021-05-27: 1000 mL

## 2021-05-25 MED ORDER — ACETAMINOPHEN 325 MG PO TABS
650.0000 mg | ORAL_TABLET | ORAL | Status: AC
  Administered 2021-05-25 – 2021-05-26 (×2): 650 mg
  Filled 2021-05-25 (×2): qty 2

## 2021-05-25 MED ORDER — CALCIUM GLUCONATE-NACL 2-0.675 GM/100ML-% IV SOLN
2.0000 g | Freq: Once | INTRAVENOUS | Status: AC
  Administered 2021-05-25: 2000 mg via INTRAVENOUS
  Filled 2021-05-25: qty 100

## 2021-05-25 MED ORDER — ORAL CARE MOUTH RINSE
15.0000 mL | OROMUCOSAL | Status: DC
  Administered 2021-05-25 – 2021-05-29 (×40): 15 mL via OROMUCOSAL

## 2021-05-25 MED ORDER — THIAMINE HCL 100 MG PO TABS
100.0000 mg | ORAL_TABLET | Freq: Every day | ORAL | Status: AC
  Administered 2021-05-26 – 2021-05-31 (×5): 100 mg
  Filled 2021-05-25 (×5): qty 1

## 2021-05-25 MED ORDER — VITAL HIGH PROTEIN PO LIQD
1000.0000 mL | ORAL | Status: DC
  Administered 2021-05-25: 1000 mL

## 2021-05-25 MED ORDER — SODIUM BICARBONATE 8.4 % IV SOLN
50.0000 meq | Freq: Once | INTRAVENOUS | Status: AC
  Administered 2021-05-25: 50 meq via INTRAVENOUS
  Filled 2021-05-25: qty 50

## 2021-05-25 MED ORDER — HYDROCORTISONE SOD SUC (PF) 100 MG IJ SOLR
100.0000 mg | Freq: Two times a day (BID) | INTRAMUSCULAR | Status: DC
  Administered 2021-05-25 (×2): 100 mg via INTRAVENOUS
  Filled 2021-05-25 (×2): qty 2

## 2021-05-25 MED ORDER — ACETAMINOPHEN 160 MG/5ML PO SOLN
650.0000 mg | ORAL | Status: DC | PRN
  Administered 2021-05-28: 650 mg
  Filled 2021-05-25: qty 20.3

## 2021-05-25 MED ORDER — PROSOURCE TF PO LIQD
45.0000 mL | Freq: Three times a day (TID) | ORAL | Status: DC
  Administered 2021-05-25 – 2021-05-28 (×9): 45 mL
  Filled 2021-05-25 (×9): qty 45

## 2021-05-25 MED ORDER — SODIUM CHLORIDE 0.9% FLUSH
10.0000 mL | INTRAVENOUS | Status: DC | PRN
  Administered 2021-05-25: 40 mL

## 2021-05-25 MED ORDER — ONDANSETRON HCL 4 MG/2ML IJ SOLN
4.0000 mg | Freq: Four times a day (QID) | INTRAMUSCULAR | Status: DC | PRN
  Administered 2021-05-28 – 2021-05-30 (×2): 4 mg via INTRAVENOUS
  Filled 2021-05-25 (×2): qty 2

## 2021-05-25 MED ORDER — SODIUM BICARBONATE 8.4 % IV SOLN
INTRAVENOUS | Status: AC
  Filled 2021-05-25: qty 50

## 2021-05-25 MED ORDER — POLYETHYLENE GLYCOL 3350 17 G PO PACK
17.0000 g | PACK | Freq: Every day | ORAL | Status: DC
  Administered 2021-05-26 – 2021-05-27 (×2): 17 g
  Filled 2021-05-25 (×2): qty 1

## 2021-05-25 MED ORDER — SODIUM CHLORIDE 0.9 % IV SOLN
2.0000 g | INTRAVENOUS | Status: AC
  Administered 2021-05-26 – 2021-05-28 (×3): 2 g via INTRAVENOUS
  Filled 2021-05-25 (×3): qty 20

## 2021-05-25 MED ORDER — SODIUM CHLORIDE 0.9 % IV SOLN
500.0000 [IU]/h | INTRAVENOUS | Status: DC
  Administered 2021-05-25: 500 [IU]/h via INTRAVENOUS_CENTRAL
  Filled 2021-05-25: qty 2

## 2021-05-25 MED ORDER — MAGNESIUM SULFATE 2 GM/50ML IV SOLN
2.0000 g | Freq: Once | INTRAVENOUS | Status: DC | PRN
  Administered 2021-05-25: 2 g via INTRAVENOUS
  Filled 2021-05-25: qty 50

## 2021-05-25 MED ORDER — PROSOURCE TF PO LIQD
45.0000 mL | Freq: Two times a day (BID) | ORAL | Status: DC
  Administered 2021-05-25: 45 mL
  Filled 2021-05-25: qty 45

## 2021-05-25 MED ORDER — ACETAMINOPHEN 650 MG RE SUPP: 650.0000 mg | RECTAL | Status: DC | PRN

## 2021-05-25 NOTE — Progress Notes (Signed)
CSW received consult to help assist with finding family for patient. CSW contacted non emergency 911 communications in an effort to try and locate family for patient.[employer] going to go out to patients address listed on facesheet to see if anyone else lives in the home, to help identify family for patient to assist with medical decisions for patient. CSW awaiting callback from [employer]. CSW will continue to follow.

## 2021-05-25 NOTE — Procedures (Signed)
Central Venous Catheter Insertion Procedure Note  Andres Braun  [id number]  1975/12/25  Date:05/25/21  Time:3:27 PM   Provider Performing:Pete Bea Laura Tanja Port   Procedure: Insertion of Non-tunneled Central Venous Catheter(36556)with US guidance (14431)    Indication(s) Hemodialysis  Consent Risks of the procedure as well as the alternatives and risks of each were explained to the patient and/or caregiver.  Consent for the procedure was obtained and is signed in the bedside chart  Anesthesia Topical only with 1% lidocaine   Timeout Verified patient identification, verified procedure, site/side was marked, verified correct patient position, special equipment/implants available, medications/allergies/relevant history reviewed, required imaging and test results available.  Sterile Technique Maximal sterile technique including full sterile barrier drape, hand hygiene, sterile gown, sterile gloves, mask, hair covering, sterile ultrasound probe cover (if used).  Procedure Description Area of catheter insertion was cleaned with chlorhexidine and draped in sterile fashion.   With real-time ultrasound guidance a HD catheter was placed into the right femoral vein.  Nonpulsatile blood flow and easy flushing noted in all ports.  The catheter was sutured in place and sterile dressing applied.  Complications/Tolerance None; patient tolerated the procedure well. Chest X-ray is ordered to verify placement for internal jugular or subclavian cannulation.  Chest x-ray is not ordered for femoral cannulation.  EBL Minimal  Specimen(s) None  Andres Braun ACNP-BC [facility] Pulmonary/Critical Care Pager # [phone] OR # [phone] if no answer

## 2021-05-25 NOTE — Progress Notes (Signed)
[facility] Progress Note Patient Name: Andres Braun DOB: 07-25-1975 MRN: [id number]   Date of Service  05/25/2021  HPI/Events of Note  ABG on 100%/PRVC 32/TV 620/P 12 = 7.233/53.1/61/22.4.  eICU Interventions  Plan: NaHCO3 50 meq IV X 1 now. Increase PEEP to 14. Repeat ABG at 9 AM.     Intervention Category Major Interventions: Respiratory failure - evaluation and management;Hypoxemia - evaluation and management  Lysle Dingwall 05/25/2021, 5:23 AM

## 2021-05-25 NOTE — Progress Notes (Addendum)
NAME:  Kareen Hitsman, MRN:  [id number], DOB:  14-Dec-1975, LOS: 1 ADMISSION DATE:  05/24/2021, CONSULTATION DATE:  05/25/21 REFERRING MD:  EDP, CHIEF COMPLAINT:  cardiac arrest   History of Present Illness:  46 y.o. man admitted after cardiac arrest at home. Patient reportedly out drinking with his friends night prior.   Found by friends agonal breathing.  Lost pulse.     Pertinent  Medical History  Unknown  Significant Hospital Events: Including procedures, antibiotic start and stop dates in addition to other pertinent events   1/15:  20 minutes of CPR, 5 rounds of epinephrine.  ROSC was obtained.  Rhythm PEA per report.  Emesis noted all around him. On arrival to ED, severely acidemic pH 6.7.  Refractory acidemia.  Intubated. Difficult to ventilate.  Eventually requiring dose of paralytic after adequate sedation.  Labs notable for renal failure, elevated LFTs.  CK in the 14,000 range. Started on ceftriaxone for aspiration PNA, Nephro consulted. Started on CRRT.  CVL and HD cath placed.  1/16: hypotensive overnight. Having Anoxic myoclonus during evening hrs. Treated w/ Versed. Having difficulty w/ HD cath clotting due to Curved ports affecting flow.   Interim History / Subjective:  Nursing staff having trouble w/ HD cath clotting currently heavily sedated on fentanyl and Versed infusion Objective   Blood pressure (Abnormal) 158/106, pulse (Abnormal) 111, temperature 99.7 F (37.6 C), resp. rate (Abnormal) 32, height 6' (1.829 m), weight 94 kg, SpO2 95 %. CVP:  [21 mmHg-58 mmHg] 28 mmHg  Vent Mode: PRVC FiO2 (%):  [60 %-100 %] 100 % Set Rate:  [24 bmp-32 bmp] 32 bmp Vt Set:  [600 mL-620 mL] 620 mL PEEP:  [5 cmH20-14 cmH20] 14 cmH20 Plateau Pressure:  [24 cmH20-35 cmH20] 35 cmH20   Intake/Output Summary (Last 24 hours) at 05/25/2021 8527 Last data filed at 05/25/2021 0700 Gross per 24 hour  Intake 10995.07 ml  Output 861 ml  Net 10134.07 ml   Filed Weights   05/24/21 1925  05/25/21 0600  Weight: 93.5 kg 94 kg    Examination: General: Unresponsive 46 year old male patient currently on heavy sedation and multiple vasoactive drips heavy sedation initiated for myoclonus last evening  HEENT normocephalic atraumatic no jugular venous distention appreciated orally intubated orogastric tube in place pupils are pinpoint mucous membranes are moist  Pulmonary: Clear diminished bases auto PEEP approximately 4-5 cmH2O current plateau pressure 34, driving pressure 20  portable chest x-ray personally reviewed: From admission day: Right IJ triple-lumen dialysis catheter, and left IJ triple-lumen catheter are in satisfactory position.  Right greater than left airspace disease noted Cardiac regular rate and rhythm Extremities warm dry dependent edema pulses palpable Neuro: Sedated heavily.  Will grimace with attempts at oral suctioning, positive cough, no peripheral movement with noxious stimulus GU Foley catheter in place with concentrated yellow urine minimal output  Resolved Hospital Problem list     Assessment & Plan:  Cardiac arrest: Reported PEA arrest.  20 minutes of CPR, 5 doses of epi.  Emesis noted all over patient.  Suspect respiratory event, pneumonia, aspiration likely. Plan Cont tele  Supportive care  Circulatory shock. Favor mix of septic shock from aspiration and post-arrest vasoplegia. His ejection fraction is 55 to 60% with normal regional wall function -Central venous pressure 28 Plan Continue to titrate vasoactive drips for mean arterial pressure greater than 65 Ensure pH greater than 7.2 Stress dose steroids, to begin after random cortisol Holding antihypertensives Continue telemetry monitoring Maintain euvolemia  Acute Hypoxemic Respiratory  Failure: Due to aspiration pneumonia and subsequent cardiac arrest Plan Repeat chest x-ray, concerned about evolving ARDS Still hypercarbic w/ no real room to increased Ve. Needs  protective lung ventilation  strategy as PPlat and driving pressures high on 8 ml/kg. Will dec to 7 ml/kg and accept hypercarbia  Follow-up arterial blood gas VAP bundle PAD protocol RASS goal -4 to -5 Will avoid prone positioning given hemodynamic instability and what is almost certainly a devastating neurological injury  Acute metabolic encephalopathy favoring postarrest anoxic encephalopathy. Initial CT brain negative, downtime in excess of 20 minutes Plan  Obtain EEG Correct metabolic derangements MRI 1/17 looking for anoxic injury Avoid fever  Acute renal failure: creatinine 2.7, urine output marginal.  Contributing to metabolic disturbances.  CK consistent with rhabdomyolysis. Plan Treating shock CRRT Serial labs Strict intake output Adjust medications for renal function  Mixed metabolic and respiratory acidosis: Acid-base improving some with CRRT, Still slightly hypercarbic.  Last lactic acid greater than 9 plan Continue CRRT Repeat lactate  Fluid electrolyte imbalance: Hypomagnesemia Plan Hold off on Mg replacement for now   Bloody OG output: Possibly traumatic. Plan PPI twice daily   Elevated liver enzymes:  T bili not Phos normal.  Suspect due to shock.  Likely related to rhabdomyolysis.   Plan A.m. LFTs  Best Practice (right click and "Reselect all SmartList Selections" daily)   Diet/type: tubefeeds DVT prophylaxis: prophylactic heparin  GI prophylaxis: PPI Lines: Central line, Dialysis Catheter, and Arterial Line Foley:  Yes, and it is still needed Code Status:  full code Last date of multidisciplinary goals of care discussion n/a.  Consulting social work, we need to get family here to discuss goals of care.    Critical care time: 34 minutes   CRITICAL CARE Performed by: Shelby Mattocks

## 2021-05-25 NOTE — Progress Notes (Signed)
[facility] Progress Note   Subjective:   catheter issues leading to clotting filters repeatedly, order in for temp HD line change out.  Remains critically ill, intubated, sedated, vasopressor supported.  Oliguric  Objective Vitals:   05/25/21 0530 05/25/21 0600 05/25/21 0700 05/25/21 0738  BP:  (!) 158/106    Pulse: (!) 102 (!) 105 (!) 108 (!) 111  Resp: (!) 32 (!) 32 (!) 32 (!) 32  Temp: (!) 97.3 F (36.3 C) 98.2 F (36.8 C) 99.7 F (37.6 C)   TempSrc:      SpO2: 93% 90% 96% 95%  Weight:  94 kg    Height:       Physical Exam General: intubated, sedated Heart: tachycardic, regular, no rub Lungs: coarse BL Abdomen: soft Extremities: no edema Dialysis Access:  RIJ Temp HD catheter with visible kinks in external portion of line  Additional Objective Labs: Basic Metabolic Panel: Recent Labs  Lab 05/24/21 0916 05/24/21 0931 05/24/21 1406 05/24/21 1421 05/24/21 2318 05/25/21 0416 05/25/21 0419  NA 145   < > 148*   < > 135 136 135  K 2.6*   < > 4.1   < > 5.6* 5.0 5.0  CL 112*  --  113*  --   --  100  --   CO2 14*  --  24  --   --  21*  --   GLUCOSE 77  --  86  --   --  162*  --   BUN 17  --  18  --   --  28*  --   CREATININE 2.74*  --  2.38*  --   --  3.18*  --   CALCIUM 8.2*  --  6.2*  --   --  6.2*  --   PHOS  --   --   --   --   --  5.9*  --    < > = values in this interval not displayed.   Liver Function Tests: Recent Labs  Lab 05/24/21 0916 05/24/21 1406 05/25/21 0416  AST 1,176* 2,052*  --   ALT 1,329* 1,832*  --   ALKPHOS 121 103  --   BILITOT 0.4 0.4  --   PROT 5.5* 4.4*  --   ALBUMIN 3.0* 2.4* 2.2*   No results for input(s): LIPASE, AMYLASE in the last 168 hours. CBC: Recent Labs  Lab 05/24/21 0916 05/24/21 0931 05/24/21 1931 05/24/21 1952 05/24/21 2318 05/25/21 0416 05/25/21 0419  WBC 9.6  --  4.1  --   --  9.9  --   NEUTROABS 5.2  --   --   --   --   --   --   HGB 15.9   < > 13.9   < > 14.3 13.9 14.6  HCT 53.1*   < >  44.5   < > 42.0 42.1 43.0  MCV 95.5  --  87.9  --   --  86.6  --   PLT 256  --  177  --   --  111*  --    < > = values in this interval not displayed.   Blood Culture No results found for: SDES, SPECREQUEST, CULT, REPTSTATUS  Cardiac Enzymes: Recent Labs  Lab 05/24/21 0932 05/25/21 0416  CKTOTAL 14,071* >50,000*   CBG: Recent Labs  Lab 05/24/21 0916 05/24/21 1138 05/24/21 1248 05/24/21 1350  GLUCAP 76 59* 92 82   Iron Studies: No results for input(s): IRON, TIBC, TRANSFERRIN,  FERRITIN in the last 72 hours. @lablastinr3 @ Studies/Results: DG Abdomen 1 View  Result Date: 05/24/2021 CLINICAL DATA:  Status post CPR, enteric tube placement EXAM: ABDOMEN - 1 VIEW COMPARISON:  None. FINDINGS: Enteric tube terminates in the gastric fundus. No dilated small bowel loops. Mild-to-moderate colonic gas and stool. No evidence of pneumatosis or pneumoperitoneum. No radiopaque stones overlying the kidneys. IMPRESSION: Enteric tube terminates in the gastric fundus. Nonobstructive bowel gas pattern Electronically Signed   By: Ilona Sorrel M.D.   On: 05/24/2021 09:59   CT Head Wo Contrast  Result Date: 05/24/2021 CLINICAL DATA:  Patient found unresponsive this morning. CPR for 20 minutes. EXAM: CT HEAD WITHOUT CONTRAST TECHNIQUE: Contiguous axial images were obtained from the base of the skull through the vertex without intravenous contrast. RADIATION DOSE REDUCTION: This exam was performed according to the departmental dose-optimization program which includes automated exposure control, adjustment of the mA and/or kV according to patient size and/or use of iterative reconstruction technique. COMPARISON:  None. FINDINGS: Brain: No evidence of acute infarction, hemorrhage, hydrocephalus, extra-axial collection or mass lesion/mass effect. Vascular: No hyperdense vessel or unexpected calcification. Skull: Normal. Negative for fracture or focal lesion. Sinuses/Orbits: Globes and orbits are unremarkable.  Dependent fluid in the sphenoid posterior right ethmoid air cells and right maxillary sinus. Scattered mucosal thickening. Patient is intubated. Other: None. IMPRESSION: 1. No intracranial abnormalities. Electronically Signed   By: Lajean Manes M.D.   On: 05/24/2021 10:29   DG CHEST PORT 1 VIEW  Result Date: 05/24/2021 CLINICAL DATA:  Central line placement EXAM: PORTABLE CHEST 1 VIEW COMPARISON:  05/24/2021 at 1504 hours FINDINGS: Interval placement of dual lumen right IJ central venous catheter with distal tip terminating at the level of the proximal SVC. Stable positioning of left IJ central line. ET tube terminates 4.2 cm above the carina. Enteric tube is located within the stomach. Stable heart size. Slightly low lung volumes with crowding of the central bronchovascular markings. No pneumothorax. IMPRESSION: Interval placement of right IJ central venous catheter with distal tip terminating at the level of the proximal SVC. No pneumothorax. Electronically Signed   By: Davina Poke D.O.   On: 05/24/2021 16:43   DG CHEST PORT 1 VIEW  Result Date: 05/24/2021 CLINICAL DATA:  Cardiac arrest. Central line placement. EXAM: PORTABLE CHEST 1 VIEW COMPARISON:  Prior today FINDINGS: A new left jugular central venous catheter seen with tip overlying the superior cavoatrial junction. No evidence of pneumothorax. Endotracheal tube and nasogastric tube remain in appropriate position. Heart size is within normal limits.  Both lungs are clear. IMPRESSION: New left jugular central venous catheter in appropriate position. No evidence of pneumothorax or other acute findings. Electronically Signed   By: Marlaine Hind M.D.   On: 05/24/2021 15:24   DG Chest Portable 1 View  Result Date: 05/24/2021 CLINICAL DATA:  Post CPR comb intubated EXAM: PORTABLE CHEST 1 VIEW COMPARISON:  None. FINDINGS: Endotracheal tube tip is 3.4 cm above the carina. Enteric tube terminates in the gastric fundus. Pacer pad overlies the left  heart. Normal heart size and normal mediastinal contour. No pneumothorax. No pleural effusion. Mild diffuse prominence of the central interstitial markings. No consolidative airspace disease. IMPRESSION: 1. Well-positioned endotracheal and enteric tubes. 2. Mild diffuse prominence of the central interstitial markings, suggesting mild pulmonary edema. Electronically Signed   By: Ilona Sorrel M.D.   On: 05/24/2021 09:57   ECHOCARDIOGRAM COMPLETE  Result Date: 05/24/2021    ECHOCARDIOGRAM REPORT   Patient Name:  Boydton Date of Exam: 05/24/2021 Medical Rec #:  [id number]         Height:       72.0 in Accession #:    [id number]        Weight: Date of Birth:  1975-06-05          BSA: Patient Age:    46 years          BP:           102/81 mmHg Patient Gender: M                 HR:           78 bpm. Exam Location:  Inpatient Procedure: 2D Echo STAT ECHO Indications:    cardiac arrest  History:        Patient has no prior history of Echocardiogram examinations.  Sonographer:    Johny Chess RDCS Referring Phys: [place]  1. Left ventricular ejection fraction, by estimation, is 55 to 60%. The left ventricle has normal function. The left ventricle has no regional wall motion abnormalities. Left ventricular diastolic parameters are consistent with Grade I diastolic dysfunction (impaired relaxation).  2. Right ventricular systolic function is normal. The right ventricular size is normal.  3. The mitral valve is normal in structure. No evidence of mitral valve regurgitation. No evidence of mitral stenosis.  4. The aortic valve is tricuspid. Aortic valve regurgitation is not visualized. No aortic stenosis is present.  5. The inferior vena cava is normal in size with greater than 50% respiratory variability, suggesting right atrial pressure of 3 mmHg. Comparison(s): No prior Echocardiogram. FINDINGS  Left Ventricle: Left ventricular ejection fraction, by estimation, is 55 to 60%. The  left ventricle has normal function. The left ventricle has no regional wall motion abnormalities. The left ventricular internal cavity size was normal in size. There is  no left ventricular hypertrophy. Left ventricular diastolic parameters are consistent with Grade I diastolic dysfunction (impaired relaxation). Right Ventricle: The right ventricular size is normal. No increase in right ventricular wall thickness. Right ventricular systolic function is normal. Left Atrium: Left atrial size was normal in size. Right Atrium: Right atrial size was normal in size. Pericardium: Trivial pericardial effusion is present. Mitral Valve: The mitral valve is normal in structure. No evidence of mitral valve regurgitation. No evidence of mitral valve stenosis. Tricuspid Valve: The tricuspid valve is normal in structure. Tricuspid valve regurgitation is not demonstrated. No evidence of tricuspid stenosis. Aortic Valve: The aortic valve is tricuspid. Aortic valve regurgitation is not visualized. No aortic stenosis is present. Pulmonic Valve: The pulmonic valve was grossly normal. Pulmonic valve regurgitation is not visualized. No evidence of pulmonic stenosis. Aorta: The aortic root is normal in size and structure. Venous: The inferior vena cava is normal in size with greater than 50% respiratory variability, suggesting right atrial pressure of 3 mmHg. IAS/Shunts: No atrial level shunt detected by color flow Doppler.  LEFT VENTRICLE PLAX 2D LVIDd:         4.90 cm     Diastology LVIDs:         3.40 cm     LV e' medial:    9.25 cm/s LV PW:         1.10 cm     LV E/e' medial:  5.7 LV IVS:        1.00 cm     LV e' lateral:   9.25 cm/s LVOT diam:  2.00 cm     LV E/e' lateral: 5.7 LV SV:         53 LVOT Area:     3.14 cm  LV Volumes (MOD) LV vol d, MOD A2C: 85.8 ml LV vol s, MOD A2C: 37.5 ml LV SV MOD A2C:     48.3 ml RIGHT VENTRICLE            IVC RV S prime:     8.59 cm/s  IVC diam: 1.60 cm TAPSE (M-mode): 2.2 cm LEFT ATRIUM              RIGHT ATRIUM LA diam:        3.60 cm RA Area:     16.00 cm LA Vol (A2C):   48.2 ml RA Volume:   40.90 ml LA Vol (A4C):   47.1 ml LA Biplane Vol: 49.1 ml  AORTIC VALVE LVOT Vmax:   94.90 cm/s LVOT Vmean:  58.800 cm/s LVOT VTI:    0.170 m  AORTA Ao Root diam: 3.40 cm Ao Asc diam:  3.30 cm MITRAL VALVE MV Area (PHT): 3.72 cm    SHUNTS MV Decel Time: 204 msec    Systemic VTI:  0.17 m MV E velocity: 52.30 cm/s  Systemic Diam: 2.00 cm MV A velocity: 45.40 cm/s MV E/A ratio:  1.15 Rudean Haskell MD Electronically signed by Rudean Haskell MD Signature Date/Time: 05/24/2021/12:19:57 PM    Final    Medications:  sodium chloride 250 mL (05/24/21 0956)   sodium chloride     cefTRIAXone (ROCEPHIN)  IV Stopped (05/24/21 1231)   fentaNYL infusion INTRAVENOUS 300 mcg/hr (05/25/21 0700)   heparin 10,000 units/ 20 mL infusion syringe 500 Units/hr (05/25/21 0656)   midazolam 10 mg/hr (05/25/21 0700)   norepinephrine (LEVOPHED) Adult infusion 38 mcg/min (05/25/21 0700)   phenylephrine (NEO-SYNEPHRINE) Adult infusion     prismasol BGK 4/2.5 2,000 mL/hr at 05/25/21 0353   sodium bicarbonate 150 mEq in D5W infusion 150 mL/hr at 05/25/21 0700   sodium bicarbonate (isotonic) 1000 mL infusion 150 mL/hr at 05/25/21 0200   sodium bicarbonate (isotonic) 1000 mL infusion 100 mL/hr at 05/25/21 0200   vasopressin 0.04 Units/min (05/25/21 0700)    Chlorhexidine Gluconate Cloth  6 each Topical Q0600   fentaNYL (SUBLIMAZE) injection  100 mcg Intravenous Once   fentaNYL (SUBLIMAZE) injection  200 mcg Intravenous Once   heparin  5,000 Units Subcutaneous Q8H   pantoprazole (PROTONIX) IV  40 mg Intravenous Q12H   sodium bicarbonate        Assessment/ Plan: AKI severe: in setting of cardiac arrest, rhabdo.  Oliguric.  Requring CRRT - continue for now.  RN has d/w PCCM and reached out to IR re: line change out.  Resume CRRT post change.  SP cardiac arrest - asystolic arrest in setting of prob asp PNA/ ARDS in  setting of EtOH Severe metabolic/ resp acidosis -  stop na bicarb gtt, manage with CRRT and vent AHRF - suspected aspiration pneumonitis w/ ARDS, on antibiotics per primary.  Jannifer Hick MD 05/25/2021, 8:41 AM  [facility] Pager: [phone]

## 2021-05-25 NOTE — Progress Notes (Signed)
Date and time results received: 05/25/21 1800  (use smartphrase ".now" to insert current time)  Test: Ca  Critical Value: 5.6  Name of Provider Notified: Zenia Resides, NP  Provider notified. No orders at this time. Will continue to monitor.

## 2021-05-25 NOTE — Progress Notes (Signed)
Initial Nutrition Assessment  DOCUMENTATION CODES:   Not applicable  INTERVENTION:   Tube Feeding via OG:  Vital 1.5 at 55 ml/hr Pro-Source TF 45 mL TID Provides 2100 kcals, 122 g of protein and 1003 mL of free water  Add B complex with C with 1 mg of folic acid while on CRRT Add Thiamine 100 mg x 7 days given unknown EtOH use, reportedly drinking before arrest   NUTRITION DIAGNOSIS:   Inadequate oral intake related to acute illness as evidenced by NPO status.  GOAL:   Patient will meet greater than or equal to 90% of their needs  MONITOR:   Vent status, TF tolerance, Weight trends, Labs  REASON FOR ASSESSMENT:   Ventilator, Consult Enteral/tube feeding initiation and management  ASSESSMENT:   46 yo male admitted post OOH cardiac arrest, acute respiratory failure post arrest, possible aspiration-intubated, AKI requiring CRRT. No PMH on file.  1/15 Admitted 1/16 TTM initiated  Pt remains on vent support, remains on CRRT but has been off due to catheter issues. Pt requiring levophed, vasopressin and phenylephrine. Sedated with fentanyl and versed  Pt with emesis all over pt when he was found down; concern for aspiration event OG tip in stomach per abd xray  Unable to obtain diet and weight history from pt at this time  Labs: ionized calcium 0.87, potassium 5.2, phosphorus 6.2 Meds: colace, miralax, lokelma x 1   NUTRITION - FOCUSED PHYSICAL EXAM:  Unable to assess, in procedures  Diet Order:   Diet Order             Diet NPO time specified  Diet effective now                   EDUCATION NEEDS:   Not appropriate for education at this time  Skin:  Skin Assessment: Reviewed RN Assessment  Last BM:  1/15  Height:   Ht Readings from Last 1 Encounters:  05/24/21 6' (1.829 m)    Weight:   Wt Readings from Last 1 Encounters:  05/25/21 94 kg    BMI:  Body mass index is 28.11 kg/m.  Estimated Nutritional Needs:   Kcal:  2100-2300  kcals  Protein:  120-140 g  Fluid:  >/= 2 L  Romelle Starcher MS, RDN, LDN, CNSC Registered Dietitian III Clinical Nutrition RD Pager and On-Call Pager Number Located in [place]

## 2021-05-25 NOTE — Procedures (Signed)
[facility]  Routine EEG Report  Patient Name:   Andres Braun, Andres Braun Date of Birth:   [birth year] Identification Number:   MRN - [id number]  Date of Study:   05/25/2021 11:35:22  Indication: Encephalopathy,  Medication: Versed, Fentanyl  Technical Summary: A routine 20 channel electroencephalogram using the international 10-20 system of electrode placement was performed.  Background: 1-2 Hz, Poorly formed  States       Coma  Abnormalities  Background Slowing: The background consists of 20-50 uV, 1-2 Hz diffuse activity with superimposed diffuse polymorphic delta activity that is non reactive to external stimulation. Other: - No Posterior Dominant Rhythm - Lact of reactivity   Activation Procedures  Hyperventilation: Not performed  Photic Stimulation: Not performed  Classification: Abnormal :  Diagnosis: This is abnormal EEG due to: - Severe background slowing - No PDR - Lack of reactivity  Clinical Correlation: The findings suggest severe diffuse cerebral dysfunction. There were no seizures or epileptiform activity seen.      Dr Wilford Sports   [facility] [phone]  Case [id number]

## 2021-05-25 NOTE — Plan of Care (Signed)

## 2021-05-25 NOTE — Progress Notes (Signed)
EEG completed, results pending. 

## 2021-05-26 ENCOUNTER — Inpatient Hospital Stay (HOSPITAL_COMMUNITY): Payer: Self-pay

## 2021-05-26 DIAGNOSIS — M62262 Nontraumatic ischemic infarction of muscle, left lower leg: Secondary | ICD-10-CM

## 2021-05-26 DIAGNOSIS — J189 Pneumonia, unspecified organism: Secondary | ICD-10-CM

## 2021-05-26 LAB — CBC
HCT: 36.9 % — ABNORMAL LOW (ref 39.0–52.0)
HCT: 35.4 % — ABNORMAL LOW (ref 39.0–52.0)
HCT: 35.5 % — ABNORMAL LOW (ref 39.0–52.0)
Hemoglobin: 12.4 g/dL — ABNORMAL LOW (ref 13.0–17.0)
Hemoglobin: 11.8 g/dL — ABNORMAL LOW (ref 13.0–17.0)
Hemoglobin: 11.7 g/dL — ABNORMAL LOW (ref 13.0–17.0)
MCH: 28.7 pg (ref 26.0–34.0)
MCH: 28.4 pg (ref 26.0–34.0)
MCH: 28.5 pg (ref 26.0–34.0)
MCHC: 33.6 g/dL (ref 30.0–36.0)
MCHC: 33.3 g/dL (ref 30.0–36.0)
MCHC: 33 g/dL (ref 30.0–36.0)
MCV: 85.4 fL (ref 80.0–100.0)
MCV: 85.3 fL (ref 80.0–100.0)
MCV: 86.4 fL (ref 80.0–100.0)
Platelets: 101 10*3/uL — ABNORMAL LOW (ref 150–400)
Platelets: 101 10*3/uL — ABNORMAL LOW (ref 150–400)
Platelets: 103 10*3/uL — ABNORMAL LOW (ref 150–400)
RBC: 4.32 MIL/uL (ref 4.22–5.81)
RBC: 4.15 MIL/uL — ABNORMAL LOW (ref 4.22–5.81)
RBC: 4.11 MIL/uL — ABNORMAL LOW (ref 4.22–5.81)
RDW: 13.2 % (ref 11.5–15.5)
RDW: 13.5 % (ref 11.5–15.5)
RDW: 13.6 % (ref 11.5–15.5)
WBC: 20.2 10*3/uL — ABNORMAL HIGH (ref 4.0–10.5)
WBC: 18.1 10*3/uL — ABNORMAL HIGH (ref 4.0–10.5)
WBC: 17.5 10*3/uL — ABNORMAL HIGH (ref 4.0–10.5)
nRBC: 0.3 % — ABNORMAL HIGH (ref 0.0–0.2)
nRBC: 0.4 % — ABNORMAL HIGH (ref 0.0–0.2)
nRBC: 0.3 % — ABNORMAL HIGH (ref 0.0–0.2)

## 2021-05-26 LAB — PHOSPHORUS: Phosphorus: 6.3 mg/dL — ABNORMAL HIGH (ref 2.5–4.6)

## 2021-05-26 LAB — GLUCOSE, CAPILLARY
Glucose-Capillary: 145 mg/dL — ABNORMAL HIGH (ref 70–99)
Glucose-Capillary: 134 mg/dL — ABNORMAL HIGH (ref 70–99)
Glucose-Capillary: 137 mg/dL — ABNORMAL HIGH (ref 70–99)
Glucose-Capillary: 138 mg/dL — ABNORMAL HIGH (ref 70–99)

## 2021-05-26 LAB — HEPATIC FUNCTION PANEL
ALT: 1756 U/L — ABNORMAL HIGH (ref 0–44)
AST: 3852 U/L — ABNORMAL HIGH (ref 15–41)
Albumin: 2.2 g/dL — ABNORMAL LOW (ref 3.5–5.0)
Alkaline Phosphatase: 86 U/L (ref 38–126)
Bilirubin, Direct: 0.4 mg/dL — ABNORMAL HIGH (ref 0.0–0.2)
Indirect Bilirubin: 0.3 mg/dL (ref 0.3–0.9)
Total Bilirubin: 0.7 mg/dL (ref 0.3–1.2)
Total Protein: 5 g/dL — ABNORMAL LOW (ref 6.5–8.1)

## 2021-05-26 LAB — RENAL FUNCTION PANEL
Albumin: 2.2 g/dL — ABNORMAL LOW (ref 3.5–5.0)
Albumin: 2.1 g/dL — ABNORMAL LOW (ref 3.5–5.0)
Anion gap: 13 (ref 5–15)
Anion gap: 13 (ref 5–15)
BUN: 37 mg/dL — ABNORMAL HIGH (ref 6–20)
BUN: 38 mg/dL — ABNORMAL HIGH (ref 6–20)
CO2: 24 mmol/L (ref 22–32)
CO2: 23 mmol/L (ref 22–32)
Calcium: 6.2 mg/dL — CL (ref 8.9–10.3)
Calcium: 6.7 mg/dL — ABNORMAL LOW (ref 8.9–10.3)
Chloride: 99 mmol/L (ref 98–111)
Chloride: 99 mmol/L (ref 98–111)
Creatinine, Ser: 3.85 mg/dL — ABNORMAL HIGH (ref 0.61–1.24)
Creatinine, Ser: 3.56 mg/dL — ABNORMAL HIGH (ref 0.61–1.24)
GFR, Estimated: 19 mL/min — ABNORMAL LOW (ref >60)
GFR, Estimated: 21 mL/min — ABNORMAL LOW (ref >60)
Glucose, Bld: 149 mg/dL — ABNORMAL HIGH (ref 70–99)
Glucose, Bld: 135 mg/dL — ABNORMAL HIGH (ref 70–99)
Phosphorus: 6.3 mg/dL — ABNORMAL HIGH (ref 2.5–4.6)
Phosphorus: 5.9 mg/dL — ABNORMAL HIGH (ref 2.5–4.6)
Potassium: 5.4 mmol/L — ABNORMAL HIGH (ref 3.5–5.1)
Potassium: 4.8 mmol/L (ref 3.5–5.1)
Sodium: 136 mmol/L (ref 135–145)
Sodium: 135 mmol/L (ref 135–145)

## 2021-05-26 LAB — POCT I-STAT 7, (LYTES, BLD GAS, ICA,H+H)
Acid-Base Excess: 2 mmol/L (ref 0.0–2.0)
Bicarbonate: 28.6 mmol/L — ABNORMAL HIGH (ref 20.0–28.0)
Calcium, Ion: 0.89 mmol/L — CL (ref 1.15–1.40)
HCT: 37 % — ABNORMAL LOW (ref 39.0–52.0)
Hemoglobin: 12.6 g/dL — ABNORMAL LOW (ref 13.0–17.0)
O2 Saturation: 100 %
Patient temperature: 37
Potassium: 5.1 mmol/L (ref 3.5–5.1)
Sodium: 133 mmol/L — ABNORMAL LOW (ref 135–145)
TCO2: 30 mmol/L (ref 22–32)
pCO2 arterial: 51 mmHg — ABNORMAL HIGH (ref 32.0–48.0)
pH, Arterial: 7.357 (ref 7.350–7.450)
pO2, Arterial: 491 mmHg — ABNORMAL HIGH (ref 83.0–108.0)

## 2021-05-26 LAB — BASIC METABOLIC PANEL
Anion gap: 14 (ref 5–15)
BUN: 35 mg/dL — ABNORMAL HIGH (ref 6–20)
CO2: 23 mmol/L (ref 22–32)
Calcium: 5.9 mg/dL — CL (ref 8.9–10.3)
Chloride: 98 mmol/L (ref 98–111)
Creatinine, Ser: 3.9 mg/dL — ABNORMAL HIGH (ref 0.61–1.24)
GFR, Estimated: 18 mL/min — ABNORMAL LOW (ref >60)
Glucose, Bld: 152 mg/dL — ABNORMAL HIGH (ref 70–99)
Potassium: 6 mmol/L — ABNORMAL HIGH (ref 3.5–5.1)
Sodium: 135 mmol/L (ref 135–145)

## 2021-05-26 LAB — MAGNESIUM
Magnesium: 2 mg/dL (ref 1.7–2.4)
Magnesium: 2.3 mg/dL (ref 1.7–2.4)

## 2021-05-26 LAB — LACTIC ACID, PLASMA
Lactic Acid, Venous: 3.5 mmol/L (ref 0.5–1.9)
Lactic Acid, Venous: 3.3 mmol/L (ref 0.5–1.9)

## 2021-05-26 LAB — APTT: aPTT: 36 seconds (ref 24–36)

## 2021-05-26 LAB — HEPARIN LEVEL (UNFRACTIONATED): Heparin Unfractionated: 0.53 IU/mL (ref 0.30–0.70)

## 2021-05-26 MED ORDER — PRISMASOL BGK 0/2.5 32-2.5 MEQ/L EC SOLN
Status: DC
  Filled 2021-05-26 (×3): qty 5000

## 2021-05-26 MED ORDER — HEPARIN SODIUM (PORCINE) 1000 UNIT/ML DIALYSIS
1000.0000 [IU] | INTRAMUSCULAR | Status: DC | PRN
  Filled 2021-05-26: qty 6
  Filled 2021-05-26: qty 3
  Filled 2021-05-26: qty 6

## 2021-05-26 MED ORDER — PRISMASOL BGK 0/2.5 32-2.5 MEQ/L EC SOLN
Status: DC
  Filled 2021-05-26 (×33): qty 5000

## 2021-05-26 MED ORDER — PRISMASOL BGK 0/2.5 32-2.5 MEQ/L EC SOLN
Status: DC
  Filled 2021-05-26 (×2): qty 5000

## 2021-05-26 MED ORDER — HEPARIN (PORCINE) 25000 UT/250ML-% IV SOLN
1300.0000 [IU]/h | INTRAVENOUS | Status: DC
  Administered 2021-05-26 – 2021-05-27 (×3): 1300 [IU]/h via INTRAVENOUS
  Filled 2021-05-26 (×3): qty 250

## 2021-05-26 NOTE — Progress Notes (Signed)
eLink Physician-Brief Progress Note Patient Name: Andres Braun DOB: Oct 28, 1975 MRN: [id number]   Date of Service  Jun 13, 2021  HPI/Events of Note  Patient with loss of pulse in distal left lower extremity (DP and PT), extremity is also cold, with prolonged capillary refill. Patient is on pressors but pulses were present earlier in the shift.  eICU Interventions  Stat arterial doppler study of left lower extremity ordered, PCCM ground team asked to evaluate the extremity to determine if vascular surgery needs to be paged stat without waiting for results of arterial doppler studies.        Thomasene Lot Kent Riendeau 13-Jun-2021, 12:08 AM

## 2021-05-26 NOTE — Progress Notes (Signed)
[place] for IV heparin Indication:  possible arterial thrombus  No Known Allergies  Patient Measurements: Height: 6' (182.9 cm) Weight: 95.5 kg (210 lb 8.6 oz) IBW/kg (Calculated) : 77.6 Heparin Dosing Weight: ~ 90 kg  Vital Signs: Temp: 98.6 F (37 C) (01/17 2100) Temp Source: Bladder (01/17 2000) BP: 116/76 (01/17 2100) Pulse Rate: 103 (01/17 2115)  Labs: Recent Labs    05/24/21 0916 05/24/21 0931 05/24/21 0932 05/24/21 1051 05/24/21 1116 05/24/21 1406 05/25/21 0416 05/25/21 0419 05/25/21 2338 05/26/21 0438 05/26/21 0917 05/26/21 0945 05/26/21 1530 05/26/21 1600 05/26/21 2017  HGB 15.9   < >  --    < >  --    < > 13.9   < >  --   --    < > 12.4* 11.8*  --  11.7*  HCT 53.1*   < >  --    < >  --    < > 42.1   < >  --   --    < > 36.9* 35.4*  --  35.5*  PLT 256  --   --   --   --    < > 111*  --   --   --   --  101* 101*  --  103*  APTT  --   --   --   --   --   --  36  --   --  36  --   --   --   --   --   LABPROT 15.2  --   --   --   --   --   --   --   --   --   --   --   --   --   --   INR 1.2  --   --   --   --   --   --   --   --   --   --   --   --   --   --   HEPARINUNFRC  --   --   --   --   --   --   --   --   --   --   --   --   --   --  0.53  CREATININE 2.74*  --   --   --   --    < > 3.18*   < > 3.90* 3.85*  --   --   --  3.56*  --   CKTOTAL  --   --  Andres,071*  --   --   --  >50,000*  --   --   --   --   --   --   --   --   TROPONINIHS 475*  --   --   --  658*  --   --   --   --   --   --   --   --   --   --    < > = values in this interval not displayed.    Estimated Creatinine Clearance: 31.4 mL/min (A) (by C-G formula based on SCr of 3.56 mg/dL (H)).  Medications:  Infusions:   sodium chloride 250 mL (05/24/21 0956)   sodium chloride     cefTRIAXone (ROCEPHIN)  IV Stopped (05/26/21 1002)   feeding supplement (VITAL 1.5 CAL)     fentaNYL infusion INTRAVENOUS  Stopped (05/26/21 0747)   heparin 1,300  Units/hr (05/26/21 2100)   magnesium sulfate Stopped (05/25/21 1511)   midazolam Stopped (05/26/21 0747)   norepinephrine (LEVOPHED) Adult infusion 20 mcg/min (05/26/21 2100)   prismasol BGK 2/2.5 dialysis solution 2,000 mL/hr at 05/26/21 1945   prismasol BGK 2/2.5 replacement solution 150 mL/hr at 05/26/21 0151   prismasol BGK 2/2.5 replacement solution 100 mL/hr at 05/26/21 0154   vasopressin 0.03 Units/min (05/26/21 2100)    Assessment: 46 yo Braun admitted post arrest, now with cold extremities, concern for arterial thrombus vs. Vasoconstriction from high-dose pressors.  Pharmacy asked to begin IV heparin.    Initial heparin level is therapeutic at 0.53 on current rate of 1300 units/hr.  Plts are up slightly at 103. H/H is low-stable. No issues or bleeding reported.   Goal of Therapy:  Heparin level 0.3-0.7 units/ml Monitor platelets by anticoagulation protocol: Yes   Plan:  Continue IV heparin gtt at rate of 1300 units/hr.  Check heparin level 8 hrs - ok with AM labs Checking CBC q 6 hrs for now.  Sloan Leiter, PharmD, BCPS, BCCCP Clinical Pharmacist Please refer to [facility] for [place] numbers  05/26/2021 9:43 PM   [facility] pharmacy phone numbers are listed on [email or web address]

## 2021-05-26 NOTE — Progress Notes (Signed)
NAME:  Andres Braun, MRN:  [id number], DOB:  June 05, 1975, LOS: 2 ADMISSION DATE:  05/24/2021, CONSULTATION DATE:  05/26/21 REFERRING MD:  EDP, CHIEF COMPLAINT:  cardiac arrest   History of Present Illness:  46 y.o. man admitted after cardiac arrest at home. Patient reportedly out drinking with his friends night prior.   Found by friends agonal breathing.  Lost pulse.     Pertinent  Medical History  Unknown  Significant Hospital Events: Including procedures, antibiotic start and stop dates in addition to other pertinent events   1/15:  20 minutes of CPR, 5 rounds of epinephrine.  ROSC was obtained.  Rhythm PEA per report.  Emesis noted all around him. On arrival to ED, severely acidemic pH 6.7.  Refractory acidemia.  Intubated. Difficult to ventilate.  Eventually requiring dose of paralytic after adequate sedation.  Labs notable for renal failure, elevated LFTs.  CK in the 14,000 range. Started on ceftriaxone for aspiration PNA, Nephro consulted. Started on CRRT.  CVL and HD cath placed.  1/16: hypotensive overnight. Having Anoxic myoclonus during evening hrs. Treated w/ Versed. Having difficulty w/ HD cath clotting due to Curved ports affecting flow.  EEG suggested diffuse cerebral dysfunction.  There is no epileptiform activity or seizures.  Right IJ dialysis catheter removed due to malfunction, right femoral dialysis catheter placed.  Social work working on family contacts.  Temperature management initiated to avoid fever 1/17: Hemodynamics a little better.  Sedation minimized.  Exam a little improved compared to 1/16 spontaneously moving left upper extremity to stimulus, more forceful cough, opens eyes.  Lower extremity pulses no longer palpable or audible via Doppler. IV heparin stared. Dopplers ordered   Interim History / Subjective:  Remains on full ventilator support Objective   Blood pressure 108/85, pulse (Abnormal) 108, temperature 98.6 F (37 C), resp. rate (Abnormal) 30, height  6' (1.829 m), weight 95.5 kg, SpO2 100 %. CVP:  [11 mmHg-25 mmHg] 13 mmHg  Vent Mode: PRVC FiO2 (%):  [100 %] 100 % Set Rate:  [32 bmp] 32 bmp Vt Set:  [540 mL] 540 mL PEEP:  [14 cmH20] 14 cmH20 Plateau Pressure:  [28 cmH20-34 cmH20] 29 cmH20   Intake/Output Summary (Last 24 hours) at 05/26/2021 0814 Last data filed at 05/26/2021 0700 Gross per 24 hour  Intake 2792.88 ml  Output 2655 ml  Net 137.88 ml   Filed Weights   05/24/21 1925 05/25/21 0600 05/26/21 0300  Weight: 93.5 kg 94 kg 95.5 kg    Examination: General: 46 year old male patient who remains on high-dose pressors and full ventilatory support requiring high FiO2 HEENT normocephalic atraumatic orally intubated orogastric tube with significant mount of gastric output not tolerating tube feeds Pulmonary: Coarse decreased bilaterally current plateau pressure 30, driving pressure 16, pulse oximetry 90s.  Portable chest x-ray reviewed earlier bibasilar airspace disease with more dense consolidation in bases Cardiac: Regular rate and rhythm Extremities cold, mottled and discolored feet, unable to Doppler pulses, site below right knee discolored and cool Neuro: Opens eyes to noxious stimulus, seems to have left gaze preference.  Spontaneously moving left upper extremity to noxious stimulus, strong cough with suction attempt Abdomen: Soft, hypoactive bowel sounds GU: Foley catheter in place minimal urine output  Resolved Hospital Problem list     Assessment & Plan:  Cardiac arrest: Reported PEA arrest.  20 minutes of CPR, 5 doses of epi.  Emesis noted all over patient.  Suspect respiratory event, pneumonia, aspiration likely. Plan Continue telemetry Supportive care  Circulatory  shock. Favor mix of septic shock from aspiration and post-arrest vasoplegia. His ejection fraction is 55 to 60% with normal regional wall function -Central venous pressure 28, now down to 12 Plan Continuing vasoactive drips, discontinue phenylephrine,  then titrate vasopressin to off and finally norepinephrine  Ensure central venous pressure 8-12  Holding diuresis and antihypertensive  pH goal greater than 7.2  Discontinue stress dose steroids, his random cortisol was almost 30  Cold lower extremities with no palpable pulses Suspect at least to some extent due to shock state Plan Discontinue phenylephrine Repeating lactic acid Arterial Dopplers May need vascular evaluation,-start IV heparin  Acute Hypoxemic Respiratory Failure: Due to aspiration pneumonia and subsequent cardiac arrest Plan Continue currently at 7 mL/kg predicted body weight protective lung ventilation, repeat arterial blood gas, as plateau pressures borderline if acid-base can tolerate would like to go down to 6 mL/kg predicted body weight  Continue to titrate PEEP/FiO2  Avoiding prone position given neurological injuries  RASS goal -2  Day #3 ceftriaxone, complete 5 days  Acute metabolic encephalopathy favoring postarrest anoxic encephalopathy. Initial CT brain negative, downtime in excess of 20 minutes, EEG suggesting fairly significant neurological dysfunction however exam today a little improved however overall still concerned about functional recovery Plan  Avoiding fever, protocol initiated 1/16 Minimize sedation MRI as soon as pressor requirements will allow  Gastric ileus with high gastric output Plan Supportive care Holding tube feeds Cor Trak 1/18  Acute renal failure: creatinine: About the same on CRRT Plan supportive care Treat shock Continue CRRT Keep Foley catheter in place another 24 hours Serial chemistries  Mixed metabolic and respiratory acidosis: Acid-base improving some with CRRT, Still slightly hypercarbic.  Last lactic acid greater than 9 plan Continuing CRRT Repeat lactic acid  Fluid electrolyte imbalance: Intermittent Plan Replace and recheck as needed  Bloody OG output: Possibly traumatic. Plan PPI twice  daily   Elevated liver enzymes:  T bili not Phos normal.  Suspect due to shock.  Likely related to rhabdomyolysis.   Plan A.m. liver function tests  Best Practice (right click and "Reselect all SmartList Selections" daily)   Diet/type: tubefeeds placed on hold due to high gastric residual, needs core track DVT prophylaxis: systemic heparin GI prophylaxis: PPI Lines: Central line, Dialysis Catheter, and Arterial Line Foley:  Yes, and it is still needed Code Status:  full code Last date of multidisciplinary goals of care discussion n/a.  We have a contact number of a spouse apparently separated from this woman however also has a cousin.  None of which who speak [place].  We will try to reach out via interpreter later today   Critical care time: 31 min    CRITICAL CARE Performed by: Shelby Mattocks

## 2021-05-26 NOTE — Progress Notes (Signed)
Date and time results received: 05/26/21 0035  Test: Ca and K Critical Value: 5.9 and 6.0  Name of Provider Notified: [place]  Actions Taken?: CRRT bags changed to 2K, 2.5Ca

## 2021-05-26 NOTE — Progress Notes (Signed)
Brief PCCM Note  Asked to evaluate cold pulseless left foot. He had dopplerable dp, pt pulses in left foot to start shift. His right dp has only been dopplerable and right pt has not been dopplerable all day. Left foot is a bit cooler than right foot but not swollen. Seems overall pretty symmetric and likely consequence of his high pressor requirement. We can put some warming packs on the foot for now but he still needs pressors.   [place]

## 2021-05-26 NOTE — Progress Notes (Signed)
ANTICOAGULATION CONSULT NOTE - Initial Consult  Pharmacy Consult for IV heparin Indication:  possible arterial thrombus  No Known Allergies  Patient Measurements: Height: 6' (182.9 cm) Weight: 95.5 kg (210 lb 8.6 oz) IBW/kg (Calculated) : 77.6 Heparin Dosing Weight: ~ 90 kg  Vital Signs: Temp: 98.6 F (37 C) (01/17 0800) Temp Source: Bladder (01/17 0800) BP: 108/85 (01/17 0700) Pulse Rate: 82 (01/17 0730)  Labs: Recent Labs    05/24/21 0916 05/24/21 0931 05/24/21 0932 05/24/21 1051 05/24/21 1116 05/24/21 1406 05/24/21 1931 05/24/21 1952 05/25/21 0416 05/25/21 0419 05/25/21 1035 05/25/21 1610 05/25/21 2338 05/26/21 0438 05/26/21 0917 05/26/21 0945  HGB 15.9   < >  --    < >  --    < > 13.9   < > 13.9   < > 12.6*  --   --   --  12.6* 12.4*  HCT 53.1*   < >  --    < >  --    < > 44.5   < > 42.1   < > 37.0*  --   --   --  37.0* 36.9*  PLT 256  --   --   --   --   --  177  --  111*  --   --   --   --   --   --  101*  APTT  --   --   --   --   --   --   --   --  36  --   --   --   --  36  --   --   LABPROT 15.2  --   --   --   --   --   --   --   --   --   --   --   --   --   --   --   INR 1.2  --   --   --   --   --   --   --   --   --   --   --   --   --   --   --   CREATININE 2.74*  --   --   --   --    < >  --   --  3.18*  --   --  4.27* 3.90* 3.85*  --   --   CKTOTAL  --   --  14,071*  --   --   --   --   --  >50,000*  --   --   --   --   --   --   --   TROPONINIHS 475*  --   --   --  658*  --   --   --   --   --   --   --   --   --   --   --    < > = values in this interval not displayed.    Estimated Creatinine Clearance: 29.1 mL/min (A) (by C-G formula based on SCr of 3.85 mg/dL (H)).   Medical History: No past medical history on file.  Medications:  Infusions:   sodium chloride 250 mL (05/24/21 0956)   sodium chloride     cefTRIAXone (ROCEPHIN)  IV 200 mL/hr at 05/26/21 1000   feeding supplement (VITAL 1.5 CAL)     fentaNYL infusion INTRAVENOUS  Stopped (05/26/21 0747)  heparin     magnesium sulfate Stopped (05/25/21 1511)   midazolam Stopped (05/26/21 0747)   norepinephrine (LEVOPHED) Adult infusion 20 mcg/min (05/26/21 1000)   prismasol BGK 2/2.5 dialysis solution 2,000 mL/hr at 05/26/21 0930   prismasol BGK 2/2.5 replacement solution 150 mL/hr at 05/26/21 0151   prismasol BGK 2/2.5 replacement solution 100 mL/hr at 05/26/21 0154   vasopressin 0.03 Units/min (05/26/21 1000)    Assessment: 46 yo male admitted post arrest, now with cold extremities, concern for arterial thrombus vs. Vasoconstriction from high-dose pressors.  Pharmacy asked to begin IV heparin.    Hemoglobin with slight downward trend 14.3> 12.4.  Platelets declining over past few days 177 > 111> 101.  Received SQ heparin this AM at 0605, and has been receiving heparin prn with CRRT.  Goal of Therapy:  Heparin level 0.3-0.7 units/ml Monitor platelets by anticoagulation protocol: Yes   Plan:  Start IV heparin gtt at rate of 1300 units/hr.  No bolus given recent SQ dose. Check heparin level 8 hrs after gtt starts. Checking CBC q 6 hrs for now.  [place] Crane, Roylene Reason, BCCP Clinical Pharmacist  05/26/2021 10:56 AM   [facility] pharmacy phone numbers are listed on [email or web address]

## 2021-05-26 NOTE — Progress Notes (Signed)
[facility] Progress Note   Subjective:   Catheter changed yesterday, running ok now. Remains critically ill, intubated, sedated, vasopressor supported.  Oliguric.   Some purposeful movement noted now.   Objective Vitals:   05/26/21 0645 05/26/21 0700 05/26/21 0730 05/26/21 0800  BP:  108/85    Pulse: (!) 105 (!) 108 82   Resp: 11 (!) 30 20   Temp:  98.6 F (37 C)  98.6 F (37 C)  TempSrc:    Bladder  SpO2: 100% 100% 100%   Weight:      Height:       Physical Exam General: intubated, sedated Heart: RRR, no rub Lungs: coarse BL Abdomen: soft Extremities: trace edema, feet mottled, calves warm Dialysis Access:  RIJ Temp HD catheter with visible kinks in external portion of line  Additional Objective Labs: Basic Metabolic Panel: Recent Labs  Lab 05/25/21 1126 05/25/21 1610 05/25/21 2338 05/26/21 0438 05/26/21 0917  NA  --  139 135 136 133*  K  --  5.9* 6.0* 5.4* 5.1  CL  --  100 98 99  --   CO2  --  23 23 24   --   GLUCOSE  --  143* 152* 149*  --   BUN  --  38* 35* 37*  --   CREATININE  --  4.27* 3.90* 3.85*  --   CALCIUM  --  5.6* 5.9* 6.2*  --   PHOS 6.2* 6.6*   6.6*  --  6.3*   6.3*  --     Liver Function Tests: Recent Labs  Lab 05/24/21 0916 05/24/21 1406 05/25/21 0416 05/25/21 1610 05/26/21 0438 05/26/21 0916  AST 1,176* 2,052*  --   --   --  3,852*  ALT 1,329* 1,832*  --   --   --  1,756*  ALKPHOS 121 103  --   --   --  86  BILITOT 0.4 0.4  --   --   --  0.7  PROT 5.5* 4.4*  --   --   --  5.0*  ALBUMIN 3.0* 2.4*   < > 2.1* 2.2* 2.2*   < > = values in this interval not displayed.    No results for input(s): LIPASE, AMYLASE in the last 168 hours. CBC: Recent Labs  Lab 05/24/21 0916 05/24/21 0931 05/24/21 1931 05/24/21 1952 05/25/21 0416 05/25/21 0419 05/25/21 1035 05/26/21 0917 05/26/21 0945  WBC 9.6  --  4.1  --  9.9  --   --   --  20.2*  NEUTROABS 5.2  --   --   --   --   --   --   --   --   HGB 15.9   < > 13.9   < >  13.9   < > 12.6* 12.6* 12.4*  HCT 53.1*   < > 44.5   < > 42.1   < > 37.0* 37.0* 36.9*  MCV 95.5  --  87.9  --  86.6  --   --   --  85.4  PLT 256  --  177  --  111*  --   --   --  101*   < > = values in this interval not displayed.    Blood Culture No results found for: SDES, SPECREQUEST, CULT, REPTSTATUS  Cardiac Enzymes: Recent Labs  Lab 05/24/21 0932 05/25/21 0416  CKTOTAL 14,071* >50,000*    CBG: Recent Labs  Lab 05/24/21 1350 05/25/21 1957 05/25/21 2000 05/25/21 2337  05/26/21 0438  GLUCAP 82 65* 132* 143* 145*    Iron Studies: No results for input(s): IRON, TIBC, TRANSFERRIN, FERRITIN in the last 72 hours. $RemoveB'@lablastinr3'CnsVneon$ @ Studies/Results: DG CHEST PORT 1 VIEW  Result Date: 05/26/2021 CLINICAL DATA:  Cardiac arrest.  Endotracheal tube present EXAM: PORTABLE CHEST 1 VIEW COMPARISON:  Yesterday FINDINGS: Endotracheal tube terminates 2.4 cm above carina. Nasogastric tube tip at gastric fundus. Left internal jugular line tip at superior caval/atrial junction. Midline trachea. Normal heart size. No pleural effusion or pneumothorax. Significantly improved aeration, with decreased basilar predominant atelectasis. There may be minimal developing right suprahilar opacity. IMPRESSION: Significantly improved aeration, with decreased bibasilar atelectasis. Question minimal developing right suprahilar airspace disease. Recommend attention on follow-up. Electronically Signed   By: Abigail Miyamoto M.D.   On: 05/26/2021 08:37   DG Chest Port 1 View  Result Date: 05/25/2021 CLINICAL DATA:  Respiratory failure EXAM: PORTABLE CHEST 1 VIEW COMPARISON:  Chest x-ray 05/24/2021 FINDINGS: Endotracheal tube tip is 3 cm above the carina. Enteric tube tip is in the stomach. Bilateral internal jugular lines are stable. Heart size is normal. Mediastinum appears stable and within normal limits. Low lung volumes with increased opacities at the lung bases since previous study. No large pleural effusion  visualized. No pneumothorax. IMPRESSION: 1. Medical devices as described. 2. Low lung volumes with increased opacities at the lung bases which likely represent atelectasis. Electronically Signed   By: Ofilia Neas M.D.   On: 05/25/2021 09:10   DG CHEST PORT 1 VIEW  Result Date: 05/24/2021 CLINICAL DATA:  Central line placement EXAM: PORTABLE CHEST 1 VIEW COMPARISON:  05/24/2021 at 1504 hours FINDINGS: Interval placement of dual lumen right IJ central venous catheter with distal tip terminating at the level of the proximal SVC. Stable positioning of left IJ central line. ET tube terminates 4.2 cm above the carina. Enteric tube is located within the stomach. Stable heart size. Slightly low lung volumes with crowding of the central bronchovascular markings. No pneumothorax. IMPRESSION: Interval placement of right IJ central venous catheter with distal tip terminating at the level of the proximal SVC. No pneumothorax. Electronically Signed   By: Davina Poke D.O.   On: 05/24/2021 16:43   DG CHEST PORT 1 VIEW  Result Date: 05/24/2021 CLINICAL DATA:  Cardiac arrest. Central line placement. EXAM: PORTABLE CHEST 1 VIEW COMPARISON:  Prior today FINDINGS: A new left jugular central venous catheter seen with tip overlying the superior cavoatrial junction. No evidence of pneumothorax. Endotracheal tube and nasogastric tube remain in appropriate position. Heart size is within normal limits.  Both lungs are clear. IMPRESSION: New left jugular central venous catheter in appropriate position. No evidence of pneumothorax or other acute findings. Electronically Signed   By: Marlaine Hind M.D.   On: 05/24/2021 15:24   EEG adult  Result Date: 05/25/2021 Tyler Aas, MD     05/25/2021  5:30 PM [facility] Routine EEG Report Patient Name:   Akim, Watkinson Date of Birth:   Sep 06, 1975 Identification Number:   MRN - [id number] Date of Study:   05/25/2021  11:35:22 Indication: Encephalopathy, Medication: Versed, Fentanyl Technical Summary: A routine 20 channel electroencephalogram using the international 10-20 system of electrode placement was performed. Background: 1-2 Hz, Poorly formed States      Coma Abnormalities Background Slowing: The background consists of 20-50 uV, 1-2 Hz diffuse activity with superimposed diffuse polymorphic delta activity that is non reactive to external stimulation. Other: - No Posterior Dominant Rhythm -  Lact of reactivity Activation Procedures Hyperventilation: Not performed Photic Stimulation: Not performed Classification: Abnormal : Diagnosis: This is abnormal EEG due to: - Severe background slowing - No PDR - Lack of reactivity Clinical Correlation: The findings suggest severe diffuse cerebral dysfunction. There were no seizures or epileptiform activity seen. Dr Asa Saun[facility] [phone] Case [id number]  VAS [place] ABI WITH/WO TBI  Result Date: 05/26/2021  LOWER EXTREMITY DOPPLER STUDY Patient Name:  NORA ROOKE [facility]  Date of Exam:   05/26/2021 Medical Rec #: [id number]          Accession #:    [id number] Date of Birth: 07-07-1975           Patient Gender: M Patient Age:   46 years Exam Location:  [facility] Procedure:      VAS [place] ABI WITH/WO TBI Referring Phys: Trevor Mace --------------------------------------------------------------------------------  Indications: Ischemic left lower extremity.  Limitations: Today's exam was limited due to waveforms may be unreliable due to              patient on pressors. Comparison Study: no prior Performing Technologist: Archie Patten RVS  Examination Guidelines: A complete evaluation includes at minimum, Doppler waveform signals and systolic blood pressure reading at the level of bilateral brachial, anterior tibial, and posterior tibial arteries, when vessel segments are accessible. Bilateral testing is considered an integral part of a complete examination.  Photoelectric Plethysmograph (PPG) waveforms and toe systolic pressure readings are included as required and additional duplex testing as needed. Limited examinations for reoccurring indications may be performed as noted.  ABI Findings: +---------+------------------+-----+---------+------------+  Right     Rt Pressure (mmHg) Index Waveform  Comment       +---------+------------------+-----+---------+------------+  Brachial                           triphasic               +---------+------------------+-----+---------+------------+  PTA       120                1.03  biphasic                +---------+------------------+-----+---------+------------+  DP                                           not audbible  +---------+------------------+-----+---------+------------+  Great Toe                          Absent                  +---------+------------------+-----+---------+------------+ +---------+------------------+-----+---------+-----------+  Left      Lt Pressure (mmHg) Index Waveform  Comment      +---------+------------------+-----+---------+-----------+  Brachial  116                      triphasic              +---------+------------------+-----+---------+-----------+  PTA       87                 0.75  biphasic               +---------+------------------+-----+---------+-----------+  DP  not audible  +---------+------------------+-----+---------+-----------+  Great Toe                          Absent                 +---------+------------------+-----+---------+-----------+ +-------+-----------+-----------+------------+------------+  ABI/TBI Today's ABI Today's TBI Previous ABI Previous TBI  +-------+-----------+-----------+------------+------------+  Right   1.03                                               +-------+-----------+-----------+------------+------------+  Left    0.75                                                +-------+-----------+-----------+------------+------------+  Summary: Right: Resting right ankle-brachial index is within normal range. No evidence of significant right lower extremity arterial disease. The right toe-brachial index is abnormal. Left: Resting left ankle-brachial index indicates moderate left lower extremity arterial disease. The left toe-brachial index is abnormal.  *See table(s) above for measurements and observations.     Preliminary    ECHOCARDIOGRAM COMPLETE  Result Date: 05/24/2021    ECHOCARDIOGRAM REPORT   Patient Name:   JESSEY STEHLIN [facility] Date of Exam: 05/24/2021 Medical Rec #:  [id number]         Height:       72.0 in Accession #:    [id number]        Weight: Date of Birth:  Jul 05, 1975          BSA: Patient Age:    40 years          BP:           102/81 mmHg Patient Gender: M                 HR:           78 bpm. Exam Location:  Inpatient Procedure: 2D Echo STAT ECHO Indications:    cardiac arrest  History:        Patient has no prior history of Echocardiogram examinations.  Sonographer:    Johny Chess RDCS Referring Phys: [place]  1. Left ventricular ejection fraction, by estimation, is 55 to 60%. The left ventricle has normal function. The left ventricle has no regional wall motion abnormalities. Left ventricular diastolic parameters are consistent with Grade I diastolic dysfunction (impaired relaxation).  2. Right ventricular systolic function is normal. The right ventricular size is normal.  3. The mitral valve is normal in structure. No evidence of mitral valve regurgitation. No evidence of mitral stenosis.  4. The aortic valve is tricuspid. Aortic valve regurgitation is not visualized. No aortic stenosis is present.  5. The inferior vena cava is normal in size with greater than 50% respiratory variability, suggesting right atrial pressure of 3 mmHg. Comparison(s): No prior Echocardiogram. FINDINGS  Left Ventricle: Left ventricular ejection fraction,  by estimation, is 55 to 60%. The left ventricle has normal function. The left ventricle has no regional wall motion abnormalities. The left ventricular internal cavity size was normal in size. There is  no left ventricular hypertrophy. Left ventricular diastolic parameters are consistent with Grade I diastolic dysfunction (impaired relaxation). Right Ventricle: The right ventricular size is normal. No  increase in right ventricular wall thickness. Right ventricular systolic function is normal. Left Atrium: Left atrial size was normal in size. Right Atrium: Right atrial size was normal in size. Pericardium: Trivial pericardial effusion is present. Mitral Valve: The mitral valve is normal in structure. No evidence of mitral valve regurgitation. No evidence of mitral valve stenosis. Tricuspid Valve: The tricuspid valve is normal in structure. Tricuspid valve regurgitation is not demonstrated. No evidence of tricuspid stenosis. Aortic Valve: The aortic valve is tricuspid. Aortic valve regurgitation is not visualized. No aortic stenosis is present. Pulmonic Valve: The pulmonic valve was grossly normal. Pulmonic valve regurgitation is not visualized. No evidence of pulmonic stenosis. Aorta: The aortic root is normal in size and structure. Venous: The inferior vena cava is normal in size with greater than 50% respiratory variability, suggesting right atrial pressure of 3 mmHg. IAS/Shunts: No atrial level shunt detected by color flow Doppler.  LEFT VENTRICLE PLAX 2D LVIDd:         4.90 cm     Diastology LVIDs:         3.40 cm     LV e' medial:    9.25 cm/s LV PW:         1.10 cm     LV E/e' medial:  5.7 LV IVS:        1.00 cm     LV e' lateral:   9.25 cm/s LVOT diam:     2.00 cm     LV E/e' lateral: 5.7 LV SV:         53 LVOT Area:     3.14 cm  LV Volumes (MOD) LV vol d, MOD A2C: 85.8 ml LV vol s, MOD A2C: 37.5 ml LV SV MOD A2C:     48.3 ml RIGHT VENTRICLE            IVC RV S prime:     8.59 cm/s  IVC diam: 1.60 cm TAPSE  (M-mode): 2.2 cm LEFT ATRIUM             RIGHT ATRIUM LA diam:        3.60 cm RA Area:     16.00 cm LA Vol (A2C):   48.2 ml RA Volume:   40.90 ml LA Vol (A4C):   47.1 ml LA Biplane Vol: 49.1 ml  AORTIC VALVE LVOT Vmax:   94.90 cm/s LVOT Vmean:  58.800 cm/s LVOT VTI:    0.170 m  AORTA Ao Root diam: 3.40 cm Ao Asc diam:  3.30 cm MITRAL VALVE MV Area (PHT): 3.72 cm    SHUNTS MV Decel Time: 204 msec    Systemic VTI:  0.17 m MV E velocity: 52.30 cm/s  Systemic Diam: 2.00 cm MV A velocity: 45.40 cm/s MV E/A ratio:  1.15 Rudean Haskell MD Electronically signed by Rudean Haskell MD Signature Date/Time: 05/24/2021/12:19:57 PM    Final    Medications:  sodium chloride 250 mL (05/24/21 0956)   sodium chloride     cefTRIAXone (ROCEPHIN)  IV 200 mL/hr at 05/26/21 1000   feeding supplement (VITAL 1.5 CAL)     fentaNYL infusion INTRAVENOUS Stopped (05/26/21 0747)   heparin     magnesium sulfate Stopped (05/25/21 1511)   midazolam Stopped (05/26/21 0747)   norepinephrine (LEVOPHED) Adult infusion 20 mcg/min (05/26/21 1000)   prismasol BGK 2/2.5 dialysis solution 2,000 mL/hr at 05/26/21 0930   prismasol BGK 2/2.5 replacement solution 150 mL/hr at 05/26/21 0151   prismasol BGK 2/2.5 replacement solution 100 mL/hr  at 05/26/21 0154   vasopressin 0.03 Units/min (05/26/21 1000)    acetaminophen  650 mg Per Tube Q4H   Or   acetaminophen (TYLENOL) oral liquid 160 mg/5 mL  650 mg Per Tube Q4H   Or   acetaminophen  650 mg Rectal Q4H   B-complex with vitamin C  1 tablet Per Tube Daily   chlorhexidine gluconate (MEDLINE KIT)  15 mL Mouth Rinse BID   Chlorhexidine Gluconate Cloth  6 each Topical Q0600   docusate  100 mg Per Tube BID   feeding supplement (PROSource TF)  45 mL Per Tube TID   fentaNYL (SUBLIMAZE) injection  100 mcg Intravenous Once   fentaNYL (SUBLIMAZE) injection  200 mcg Intravenous Once   folic acid  1 mg Per Tube Daily   mouth rinse  15 mL Mouth Rinse 10 times per day   pantoprazole  (PROTONIX) IV  40 mg Intravenous Q12H   polyethylene glycol  17 g Per Tube Daily   thiamine  100 mg Per Tube Daily    Assessment/ Plan: AKI severe: in setting of cardiac arrest, rhabdo.  Oliguric.  Requring CRRT - continue for now.  Changed to 2K fluids today due to K persistently ^.  F/u PM labs.   Net even UF today.  SP cardiac arrest - asystolic arrest in setting of prob asp PNA/ ARDS in setting of EtOH Severe metabolic/ resp acidosis -   managed with CRRT and vent AHRF - suspected aspiration pneumonitis w/ ARDS, on antibiotics per primary. Hepatitis - shock, EtOH, rhabdo contributors; per primary  Jannifer Hick MD 05/26/2021, 10:50 AM  [facility] Pager: [phone]

## 2021-05-27 ENCOUNTER — Inpatient Hospital Stay (HOSPITAL_COMMUNITY): Payer: Self-pay

## 2021-05-27 ENCOUNTER — Encounter (HOSPITAL_COMMUNITY): Payer: Self-pay | Admitting: Pulmonary Disease

## 2021-05-27 DIAGNOSIS — J690 Pneumonitis due to inhalation of food and vomit: Secondary | ICD-10-CM | POA: Diagnosis present

## 2021-05-27 LAB — CBC
HCT: 34.8 % — ABNORMAL LOW (ref 39.0–52.0)
HCT: 33.3 % — ABNORMAL LOW (ref 39.0–52.0)
HCT: 32 % — ABNORMAL LOW (ref 39.0–52.0)
Hemoglobin: 11.3 g/dL — ABNORMAL LOW (ref 13.0–17.0)
Hemoglobin: 10.5 g/dL — ABNORMAL LOW (ref 13.0–17.0)
Hemoglobin: 10.5 g/dL — ABNORMAL LOW (ref 13.0–17.0)
MCH: 28.1 pg (ref 26.0–34.0)
MCH: 27.6 pg (ref 26.0–34.0)
MCH: 28.5 pg (ref 26.0–34.0)
MCHC: 32.5 g/dL (ref 30.0–36.0)
MCHC: 31.5 g/dL (ref 30.0–36.0)
MCHC: 32.8 g/dL (ref 30.0–36.0)
MCV: 86.6 fL (ref 80.0–100.0)
MCV: 87.4 fL (ref 80.0–100.0)
MCV: 87 fL (ref 80.0–100.0)
Platelets: 101 10*3/uL — ABNORMAL LOW (ref 150–400)
Platelets: 103 10*3/uL — ABNORMAL LOW (ref 150–400)
Platelets: 95 10*3/uL — ABNORMAL LOW (ref 150–400)
RBC: 4.02 MIL/uL — ABNORMAL LOW (ref 4.22–5.81)
RBC: 3.81 MIL/uL — ABNORMAL LOW (ref 4.22–5.81)
RBC: 3.68 MIL/uL — ABNORMAL LOW (ref 4.22–5.81)
RDW: 13.8 % (ref 11.5–15.5)
RDW: 14 % (ref 11.5–15.5)
RDW: 13.9 % (ref 11.5–15.5)
WBC: 14.4 10*3/uL — ABNORMAL HIGH (ref 4.0–10.5)
WBC: 14.9 10*3/uL — ABNORMAL HIGH (ref 4.0–10.5)
WBC: 13.2 10*3/uL — ABNORMAL HIGH (ref 4.0–10.5)
nRBC: 0.4 % — ABNORMAL HIGH (ref 0.0–0.2)
nRBC: 0.2 % (ref 0.0–0.2)
nRBC: 0.3 % — ABNORMAL HIGH (ref 0.0–0.2)

## 2021-05-27 LAB — GLUCOSE, CAPILLARY
Glucose-Capillary: 108 mg/dL — ABNORMAL HIGH (ref 70–99)
Glucose-Capillary: 123 mg/dL — ABNORMAL HIGH (ref 70–99)
Glucose-Capillary: 128 mg/dL — ABNORMAL HIGH (ref 70–99)
Glucose-Capillary: 128 mg/dL — ABNORMAL HIGH (ref 70–99)
Glucose-Capillary: 141 mg/dL — ABNORMAL HIGH (ref 70–99)
Glucose-Capillary: 144 mg/dL — ABNORMAL HIGH (ref 70–99)

## 2021-05-27 LAB — RENAL FUNCTION PANEL
Albumin: 2 g/dL — ABNORMAL LOW (ref 3.5–5.0)
Albumin: 2 g/dL — ABNORMAL LOW (ref 3.5–5.0)
Anion gap: 9 (ref 5–15)
Anion gap: 11 (ref 5–15)
BUN: 34 mg/dL — ABNORMAL HIGH (ref 6–20)
BUN: 34 mg/dL — ABNORMAL HIGH (ref 6–20)
CO2: 24 mmol/L (ref 22–32)
CO2: 24 mmol/L (ref 22–32)
Calcium: 7.1 mg/dL — ABNORMAL LOW (ref 8.9–10.3)
Calcium: 7.6 mg/dL — ABNORMAL LOW (ref 8.9–10.3)
Chloride: 101 mmol/L (ref 98–111)
Chloride: 100 mmol/L (ref 98–111)
Creatinine, Ser: 3.26 mg/dL — ABNORMAL HIGH (ref 0.61–1.24)
Creatinine, Ser: 3.04 mg/dL — ABNORMAL HIGH (ref 0.61–1.24)
GFR, Estimated: 23 mL/min — ABNORMAL LOW (ref >60)
GFR, Estimated: 25 mL/min — ABNORMAL LOW (ref >60)
Glucose, Bld: 131 mg/dL — ABNORMAL HIGH (ref 70–99)
Glucose, Bld: 155 mg/dL — ABNORMAL HIGH (ref 70–99)
Phosphorus: 5.5 mg/dL — ABNORMAL HIGH (ref 2.5–4.6)
Phosphorus: 5 mg/dL — ABNORMAL HIGH (ref 2.5–4.6)
Potassium: 4.5 mmol/L (ref 3.5–5.1)
Potassium: 4 mmol/L (ref 3.5–5.1)
Sodium: 134 mmol/L — ABNORMAL LOW (ref 135–145)
Sodium: 135 mmol/L (ref 135–145)

## 2021-05-27 LAB — MAGNESIUM: Magnesium: 2.5 mg/dL — ABNORMAL HIGH (ref 1.7–2.4)

## 2021-05-27 LAB — HEPARIN LEVEL (UNFRACTIONATED): Heparin Unfractionated: 0.68 IU/mL (ref 0.30–0.70)

## 2021-05-27 LAB — APTT: aPTT: 92 seconds — ABNORMAL HIGH (ref 24–36)

## 2021-05-27 IMAGING — DX DG ABD PORTABLE 1V
1 series · 1 of 1 positions shown · non-contrast
Comparison: [DATE]

CLINICAL DATA: Evaluate feeding tube placement.

EXAM:
PORTABLE ABDOMEN - 1 VIEW

[abdomen supine]
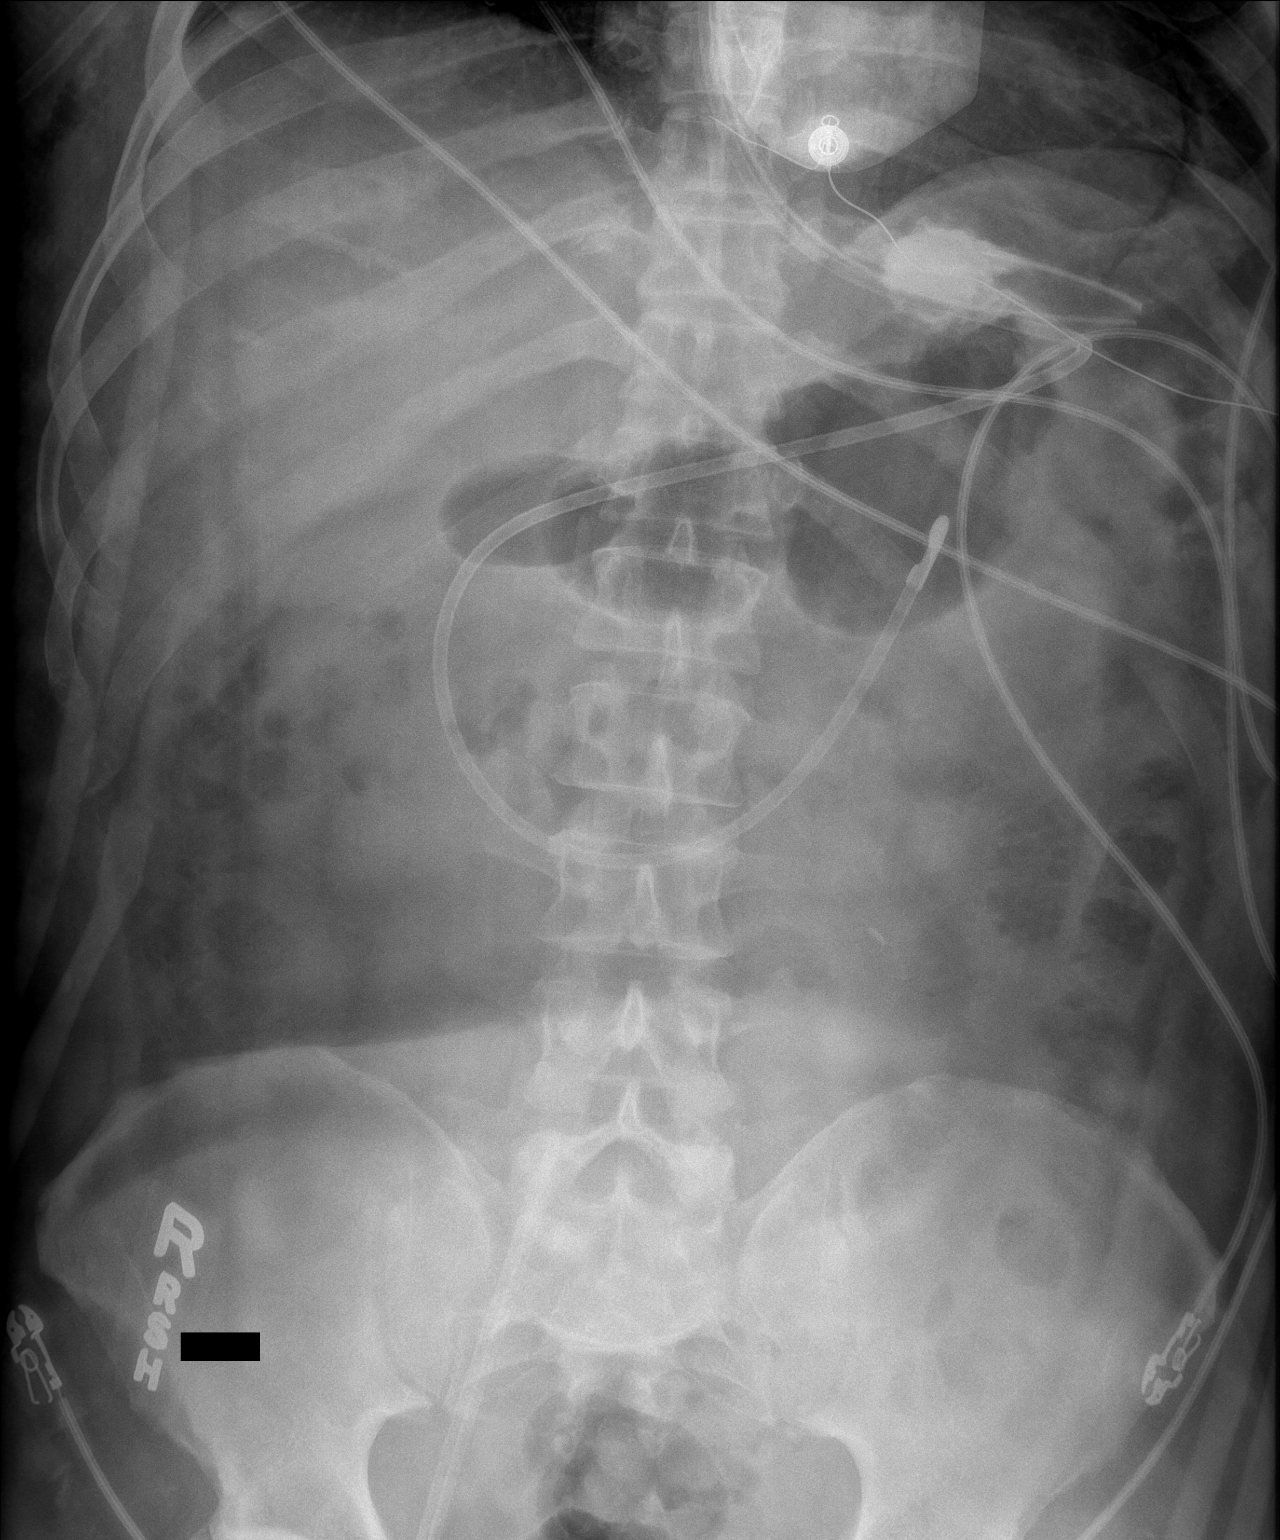

[1 of 1 positions shown; findings below may reference images not displayed]

FINDINGS: Portable radiograph of the abdomen shows interval placement of a
feeding tube. The feeding tube has a configuration corresponding to
the duodenal C loop with tip in the expected location of the
duodenal jejunal junction. There is a nasogastric tube which is in
the fundus of the stomach where retained enteric contrast material
is identified. The nasogastric tube appears kinked upon itself.
Right femoral central venous catheter is identified with tip
projecting over the right-side of the L5 vertebral body. No dilated
bowel loops identified.
IMPRESSION: 1. Feeding tube tip is at the duodenal jejunal junction. The
nasogastric tube appears kinked upon itself within the gastric
fundus.
2. Right femoral central venous catheter tip projects over the L5
vertebral body.

## 2021-05-27 IMAGING — DX DG CHEST 1V PORT
1 series · 1 of 1 positions shown · non-contrast
Comparison: Portable chest [DATE] and earlier.

CLINICAL DATA: 45-year-old male status post cardiac arrest.
Intubated.

EXAM:
PORTABLE CHEST 1 VIEW

[chest ap]
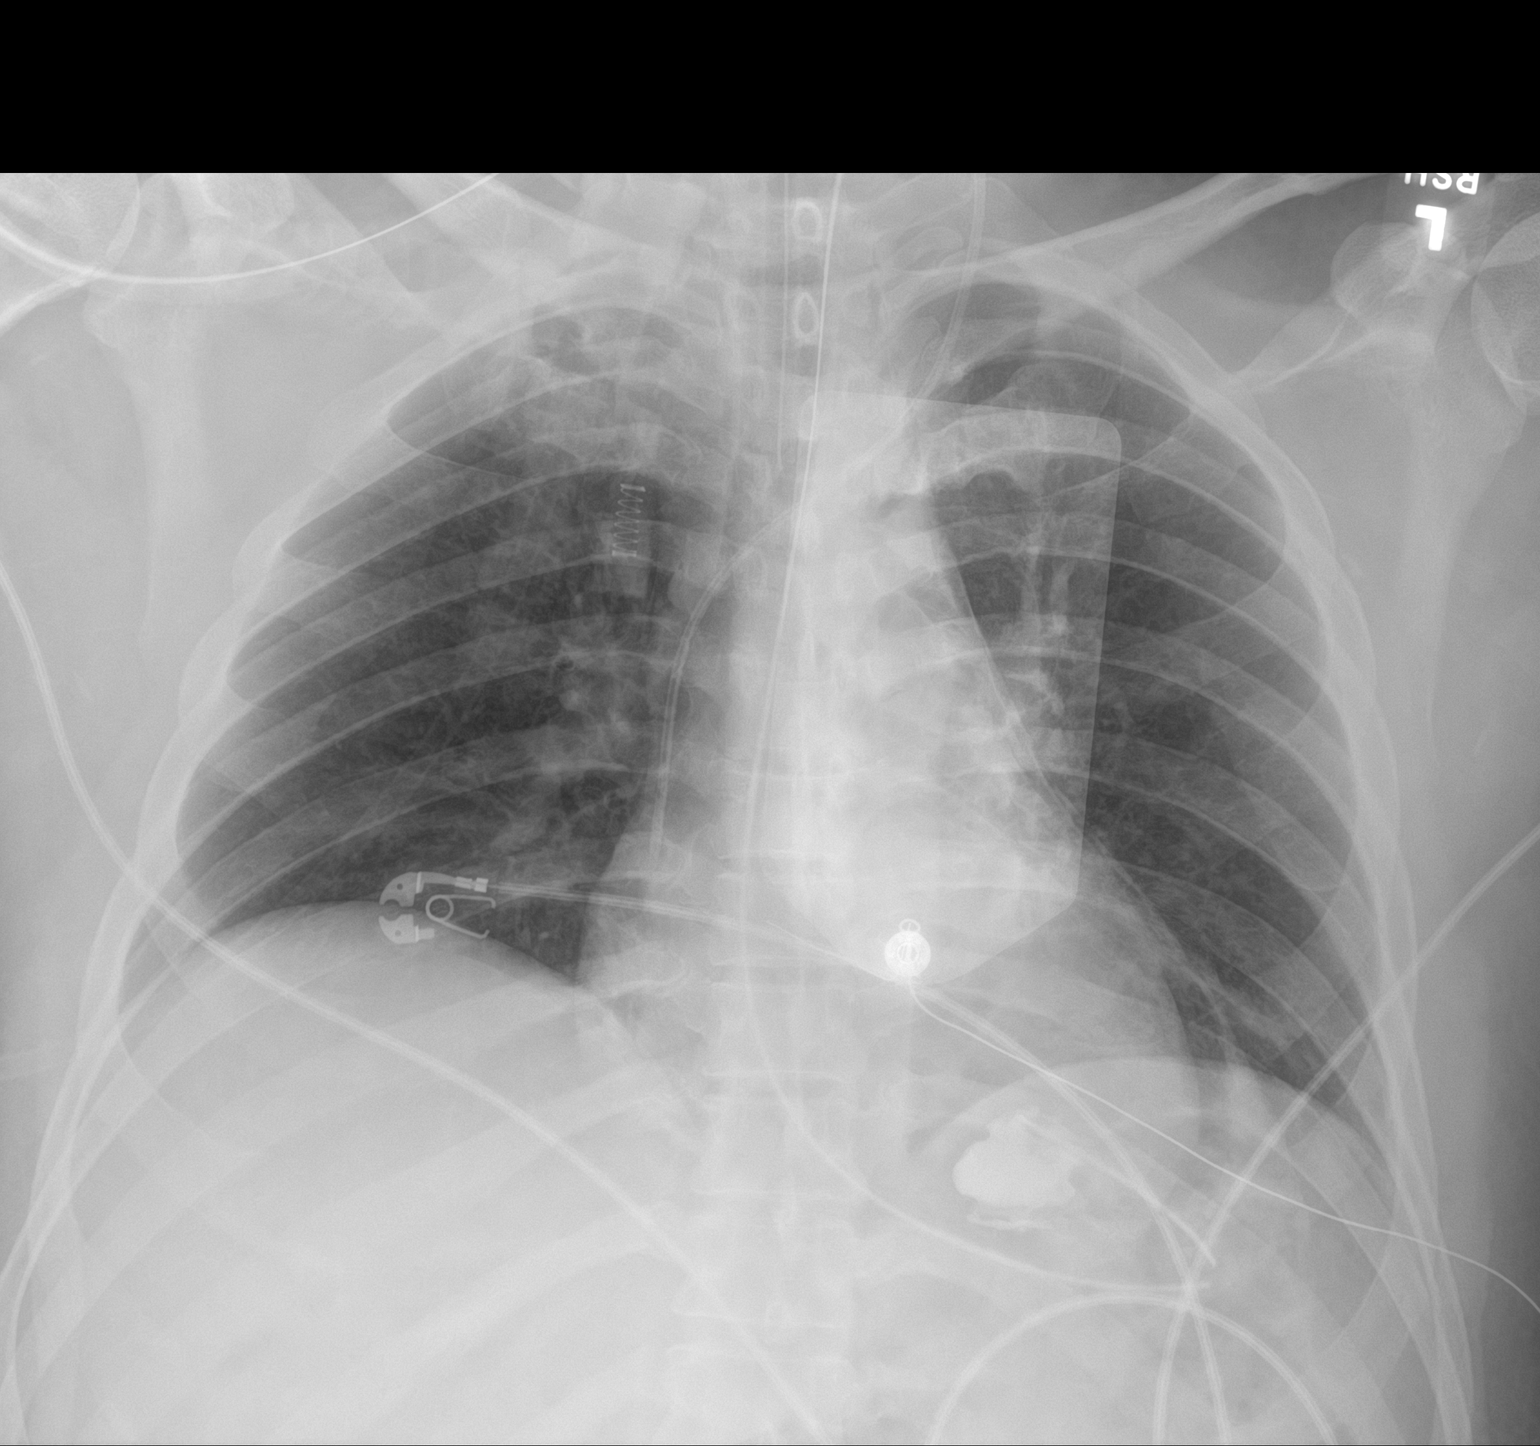

[1 of 1 positions shown; findings below may reference images not displayed]

FINDINGS: Portable AP semi upright view at [GQ] hours. Endotracheal tube tip
in good position between the clavicles and carina. Stable left IJ
central line. Enteric tube looped in the stomach, side hole at the
fundus. Small volume oral contrast at the gastric fundus unchanged.
Pacer or resuscitation pads again project over the chest. Small
volume pneumomediastinum suspected. But cardiac and mediastinal
contours remain normal. No pneumothorax identified. Allowing for
portable technique the lungs are clear. No pulmonary edema.

No rib fracture identified. No acute osseous abnormality identified.
Negative visible bowel gas.

No neck or chest wall gas identified.
IMPRESSION: 1.  Stable lines and tubes.
2. Small volume pneumomediastinum, but cardiac and mediastinal
contours remain normal. This is probably incidental, with no
pneumothorax or acute pulmonary abnormality identified.

## 2021-05-27 MED ORDER — INSULIN ASPART 100 UNIT/ML IJ SOLN
2.0000 [IU] | INTRAMUSCULAR | Status: DC
  Administered 2021-05-27 – 2021-05-31 (×7): 2 [IU] via SUBCUTANEOUS

## 2021-05-27 MED ORDER — FREE WATER
30.0000 mL | Status: DC
  Administered 2021-05-27 – 2021-05-31 (×14): 30 mL

## 2021-05-27 NOTE — Progress Notes (Signed)
ANTICOAGULATION CONSULT NOTE   Pharmacy Consult for IV heparin Indication:  possible arterial thrombus  No Known Allergies  Patient Measurements: Height: 6' (182.9 cm) Weight: 93.5 kg (206 lb 2.1 oz) IBW/kg (Calculated) : 77.6 Heparin Dosing Weight: ~ 90 kg  Vital Signs: Temp: 98.6 F (37 C) (01/18 0700) Temp Source: Bladder (01/18 0400) BP: 99/68 (01/18 0700) Pulse Rate: 119 (01/18 0700)  Labs: Recent Labs    05/24/21 0916 05/24/21 0931 05/24/21 0932 05/24/21 1051 05/24/21 1116 05/24/21 1406 05/25/21 0416 05/25/21 0419 05/26/21 0438 05/26/21 0917 05/26/21 1530 05/26/21 1600 05/26/21 2017 05/27/21 0330 05/27/21 0352  HGB 15.9   < >  --    < >  --    < > 13.9   < >  --    < > 11.8*  --  11.7* 11.3*  --   HCT 53.1*   < >  --    < >  --    < > 42.1   < >  --    < > 35.4*  --  35.5* 34.8*  --   PLT 256  --   --   --   --    < > 111*  --   --    < > 101*  --  103* 101*  --   APTT  --   --   --   --   --   --  36  --  36  --   --   --   --  92*  --   LABPROT 15.2  --   --   --   --   --   --   --   --   --   --   --   --   --   --   INR 1.2  --   --   --   --   --   --   --   --   --   --   --   --   --   --   HEPARINUNFRC  --   --   --   --   --   --   --   --   --   --   --   --  0.53 0.68  --   CREATININE 2.74*  --   --   --   --    < > 3.18*   < > 3.85*  --   --  3.56*  --   --  3.26*  CKTOTAL  --   --  14,071*  --   --   --  >50,000*  --   --   --   --   --   --   --   --   TROPONINIHS 475*  --   --   --  658*  --   --   --   --   --   --   --   --   --   --    < > = values in this interval not displayed.     Estimated Creatinine Clearance: 34 mL/min (A) (by C-G formula based on SCr of 3.26 mg/dL (H)).  Medications:  Infusions:   sodium chloride 250 mL (05/24/21 0956)   sodium chloride     cefTRIAXone (ROCEPHIN)  IV Stopped (05/26/21 1002)   feeding supplement (VITAL 1.5 CAL)     fentaNYL infusion INTRAVENOUS Stopped (05/26/21  3220)   heparin 1,300 Units/hr  (05/27/21 0700)   midazolam Stopped (05/27/21 2542)   norepinephrine (LEVOPHED) Adult infusion 10 mcg/min (05/27/21 0700)   prismasol BGK 2/2.5 dialysis solution 2,000 mL/hr at 05/27/21 0531   prismasol BGK 2/2.5 replacement solution 150 mL/hr at 05/26/21 0151   prismasol BGK 2/2.5 replacement solution 100 mL/hr at 05/26/21 0154   vasopressin Stopped (05/26/21 2358)    Assessment: 46 yo male admitted post arrest, now with cold extremities, concern for arterial thrombus vs. Vasoconstriction from high-dose pressors.  Pharmacy asked to begin IV heparin 1/17.    Heparin level at goal this AM at 0.68. No issues or bleeding reported.  Hgb stable, platelet count low, but appears stable.  Goal of Therapy:  Heparin level 0.3-0.7 units/ml Monitor platelets by anticoagulation protocol: Yes   Plan:  Continue IV heparin gtt at rate of 1300 units/hr.  Check heparin level 8 hrs - ok with AM labs Checking CBC q 6 hrs for now.  Reece Leader, Colon Flattery, BCCP Clinical Pharmacist  05/27/2021 7:28 AM   [facility] pharmacy phone numbers are listed on [email or web address]

## 2021-05-27 NOTE — Progress Notes (Signed)
NAME:  Andres Braun, MRN:  [id number], DOB:  03-12-1976, LOS: 3 ADMISSION DATE:  05/24/2021, CONSULTATION DATE:  05/27/21 REFERRING MD:  EDP, CHIEF COMPLAINT:  Cardiac arrest   History of Present Illness:  46 year old man admitted after cardiac arrest at home. PEA arrest, s/p CPR x 20 minutes and Epi x 5. Patient reportedly out drinking with his friends the night prior to admission.  Found by friends with agonal breathing.  Lost pulse.    Pertinent Medical History:  Unknown  Significant Hospital Events: Including procedures, antibiotic start and stop dates in addition to other pertinent events   1/15:  20 minutes of CPR, 5 rounds of epinephrine.  ROSC was obtained.  Rhythm PEA per report.  Emesis noted all around him. On arrival to ED, severely acidemic pH 6.7.  Refractory acidemia.  Intubated. Difficult to ventilate.  Eventually requiring dose of paralytic after adequate sedation.  Labs notable for renal failure, elevated LFTs.  CK in the 14,000 range. Started on ceftriaxone for aspiration PNA, Nephro consulted. Started on CRRT.  CVL and HD cath placed.  1/16: hypotensive overnight. Having Anoxic myoclonus during evening hrs. Treated w/ Versed. Having difficulty w/ HD cath clotting due to Curved ports affecting flow.  EEG suggested diffuse cerebral dysfunction.  There is no epileptiform activity or seizures.  Right IJ dialysis catheter removed due to malfunction, right femoral dialysis catheter placed.  Social work working on family contacts.  Temperature management initiated to avoid fever 1/17: Hemodynamics a little better.  Sedation minimized.  Exam a little improved compared to 1/16 spontaneously moving left upper extremity to stimulus, more forceful cough, opens eyes.  Lower extremity pulses no longer palpable or audible via Doppler. IV heparin stared. Dopplers ordered  1/18: Hemodynamics continue to improve, decreasing pressor requirements. Improving neuro exam, will weakly follow commands  when asked in Spanish. Readily opens eyes to voice. BLE pedal pulses now +doppler.  Interim History / Subjective:  Improved hemodynamics NE down to PEEP decreasing Neuro exam significantly improved Remains encephalopathic but weakly following commands (Spanish-speaking) Pedal pulses with stronger dopplers bilaterally today  Objective:  Blood pressure 99/68, pulse (!) 121, temperature 98.6 F (37 C), resp. rate (!) 32, height 6' (1.829 m), weight 93.5 kg, SpO2 100 %. CVP:  [9 mmHg-20 mmHg] 13 mmHg  Vent Mode: PRVC FiO2 (%):  [40 %-100 %] 40 % Set Rate:  [32 bmp] 32 bmp Vt Set:  [540 mL] 540 mL PEEP:  [14 cmH20] 14 cmH20 Plateau Pressure:  [20 cmH20-30 cmH20] 29 cmH20   Intake/Output Summary (Last 24 hours) at 05/27/2021 0745 Last data filed at 05/27/2021 0700 Gross per 24 hour  Intake 1088.87 ml  Output 3844 ml  Net -2755.13 ml    Filed Weights   05/25/21 0600 05/26/21 0300 05/27/21 0330  Weight: 94 kg 95.5 kg 93.5 kg   Physical Examination: General: Acutely ill-appearing middle-aged man in NAD. Sedated on vent. HEENT: [place]/AT, anicteric sclera, PERRL, moist mucous membranes. ETT/Cortrak/OGT in place. Neuro: Sedated. Responds to verbal stimuli. Following commands consistently. Moves all 4 extremities to painful stimuli. +Corneal, +Cough, and +Gag  CV: Tachycardic, regular rhythm, no m/g/r. PULM: Breathing even and unlabored on vent (PEEP 12, FiO2 40%). Lung fields with bibasilar crackles. GI: Soft, nontender, nondistended. Normoactive bowel sounds. Extremities: Bilateral symmetric 1+ LE edema noted. Skin: Warm/dry, no rashes. R anterior knee/shin with ecchymosis noted.  Resolved Hospital Problem List:     Assessment & Plan:  Cardiac arrest Reported PEA arrest. 20  minutes of CPR, 5 doses of epi.  Emesis noted all over patient.  Suspect respiratory event, pneumonia, aspiration likely. - Continue telemetry - Supportive care as below  Circulatory shock Favor mix of  septic shock from aspiration and post-arrest vasoplegia. His ejection fraction is 55 to 60% with normal regional wall function. CVP 28 on admission. - Continue NE for pressor support, Neo/vaso titrated off - Goal CVP 8-12 - Holding diuresis at present - SDS discontinued  Cold lower extremities with no palpable pulses Suspect at least to some extent due to shock state - Improved 1/18 with bilateral DP/PT pulses present with doppler - Continue IV heparin - Low threshold for VVS involvement if recurrent concerns  Acute Hypoxemic Respiratory Failure Due to aspiration pneumonia and subsequent cardiac arrest. - Continue full vent support (4-8cc/kg IBW, currently 7) - Downtitrate PEEP as tolerated - Wean FiO2 for O2 sat > 90% - Daily WUA/SBT beginning 1/19, if PEEP decreased - VAP bundle - Pulmonary hygiene - PAD protocol for sedation: Fentanyl and Versed for goal RASS -1 to -2 - Avoid proning in the setting of neurologic injury - Ceftriaxone x 5 day course - Follow CXR  Acute metabolic encephalopathy favoring postarrest anoxic encephalopathy. Initial CT brain negative, downtime in excess of 20 minutes, EEG suggesting fairly significant neurological dysfunction however exam today a little improved however overall still concerned about functional recovery - Avoid fever, protocol initiated 1/16 - MRI Brain once more hemodynamically stable - Minimize sedation as able - Communication with patient should be in Spanish with interpreter if at all possible  Gastric ileus with high gastric output - Cortrak placed 1/18 - Hopeful for improved residuals with post-pyloric tube placement - Continue TF, residuals per protocol  Acute renal failure Rhabdomyolysis - Nephro following, appreciate recs - Continue CRRT, 100-182mL/hr UF as pressures tolerate - Trend BMP - Replete electrolytes as indicated - Monitor I&Os - Avoid nephrotoxic agents as able - Ensure adequate renal perfusion  Mixed  metabolic and respiratory acidosis: Acid-base improving some with CRRT, Still slightly hypercarbic.  Last lactic acid greater than 9 plan - Trend LA to normal - Continue CRRT  Bloody OG output: Possibly traumatic. - PPI BID  Elevated liver enzymes:  T bili not Phos normal.  Suspect due to shock.  Likely related to rhabdomyolysis.   - Trend LFTs to normal  Best Practice (right click and "Reselect all SmartList Selections" daily)   Diet/type: tubefeeds via Cor[employer]k DVT prophylaxis: systemic heparin GI prophylaxis: PPI Lines: Central line, Dialysis Catheter, and Arterial Line Foley:  Yes, and it is still needed Code Status:  full code Last date of multidisciplinary goals of care discussion n/a.  We have a contact number of a spouse - apparently separated from this woman; however, also has a cousin.  None of which who speak [place].  We will try to reach out via interpreter today, 1/18  Critical care time: 44 minutes   Tim Lair, [place] [facility] 05/27/21 7:46 AM  Please see [email or web address] for pager details.  From 7A-7P if no response, please call [phone] After hours, please call ELink [phone]

## 2021-05-27 NOTE — Progress Notes (Signed)
[facility] Progress Note   Subjective:   I/Os yesterday 1.1 / 3.8, neg + 7.1L for admission. Remains on pressor support.    Objective Vitals:   05/27/21 1015 05/27/21 1030 05/27/21 1045 05/27/21 1100  BP:    103/67  Pulse: (!) 122 (!) 116 (!) 116 (!) 116  Resp: (!) 26 (!) 28 (!) 32 (!) 32  Temp:    98.6 F (37 C)  TempSrc:      SpO2: 97% 97% 98% 94%  Weight:      Height:       Physical Exam General: intubated, sedated Heart: RRR, no rub Lungs: coarse BL Abdomen: soft Extremities: trace edema, feet less mottled, calves warm, R knee with developing bullous lesion Dialysis Access:  RIJ temp HD catheter   Additional Objective Labs: Basic Metabolic Panel: Recent Labs  Lab 05/26/21 0438 05/26/21 0917 05/26/21 1600 05/27/21 0352  NA 136 133* 135 134*  K 5.4* 5.1 4.8 4.5  CL 99  --  99 101  CO2 24  --  23 24  GLUCOSE 149*  --  135* 131*  BUN 37*  --  38* 34*  CREATININE 3.85*  --  3.56* 3.26*  CALCIUM 6.2*  --  6.7* 7.1*  PHOS 6.3*   6.3*  --  5.9* 5.5*    Liver Function Tests: Recent Labs  Lab 05/24/21 0916 05/24/21 1406 05/25/21 0416 05/26/21 0916 05/26/21 1600 05/27/21 0352  AST 1,176* 2,052*  --  3,852*  --   --   ALT 1,329* 1,832*  --  1,756*  --   --   ALKPHOS 121 103  --  86  --   --   BILITOT 0.4 0.4  --  0.7  --   --   PROT 5.5* 4.4*  --  5.0*  --   --   ALBUMIN 3.0* 2.4*   < > 2.2* 2.1* 2.0*   < > = values in this interval not displayed.    No results for input(s): LIPASE, AMYLASE in the last 168 hours. CBC: Recent Labs  Lab 05/24/21 0916 05/24/21 0931 05/26/21 0945 05/26/21 1530 05/26/21 2017 05/27/21 0330 05/27/21 0931  WBC 9.6   < > 20.2* 18.1* 17.5* 14.4* 14.9*  NEUTROABS 5.2  --   --   --   --   --   --   HGB 15.9   < > 12.4* 11.8* 11.7* 11.3* 10.5*  HCT 53.1*   < > 36.9* 35.4* 35.5* 34.8* 33.3*  MCV 95.5   < > 85.4 85.3 86.4 86.6 87.4  PLT 256   < > 101* 101* 103* 101* 103*   < > = values in this interval not  displayed.    Blood Culture No results found for: SDES, SPECREQUEST, CULT, REPTSTATUS  Cardiac Enzymes: Recent Labs  Lab 05/24/21 0932 05/25/21 0416  CKTOTAL 14,071* >50,000*    CBG: Recent Labs  Lab 05/26/21 1612 05/26/21 2017 05/27/21 0004 05/27/21 0353 05/27/21 0736  GLUCAP 137* 138* 128* 128* 108*    Iron Studies: No results for input(s): IRON, TIBC, TRANSFERRIN, FERRITIN in the last 72 hours. $RemoveB'@lablastinr3'[id number]$ @ Studies/Results: DG Chest Port 1 View  Result Date: 05/27/2021 CLINICAL DATA:  46 year old male status post cardiac arrest. Intubated. EXAM: PORTABLE CHEST 1 VIEW COMPARISON:  Portable chest 05/26/2021 and earlier. FINDINGS: Portable AP semi upright view at 0530 hours. Endotracheal tube tip in good position between the clavicles and carina. Stable left IJ central line. Enteric tube looped in the  stomach, side hole at the fundus. Small volume oral contrast at the gastric fundus unchanged. Pacer or resuscitation pads again project over the chest. Small volume pneumomediastinum suspected. But cardiac and mediastinal contours remain normal. No pneumothorax identified. Allowing for portable technique the lungs are clear. No pulmonary edema. No rib fracture identified. No acute osseous abnormality identified. Negative visible bowel gas. No neck or chest wall gas identified. IMPRESSION: 1.  Stable lines and tubes. 2. Small volume pneumomediastinum, but cardiac and mediastinal contours remain normal. This is probably incidental, with no pneumothorax or acute pulmonary abnormality identified. Electronically Signed   By: Genevie Ann M.D.   On: 05/27/2021 06:28   DG CHEST PORT 1 VIEW  Result Date: 05/26/2021 CLINICAL DATA:  Cardiac arrest.  Endotracheal tube present EXAM: PORTABLE CHEST 1 VIEW COMPARISON:  Yesterday FINDINGS: Endotracheal tube terminates 2.4 cm above carina. Nasogastric tube tip at gastric fundus. Left internal jugular line tip at superior caval/atrial junction. Midline  trachea. Normal heart size. No pleural effusion or pneumothorax. Significantly improved aeration, with decreased basilar predominant atelectasis. There may be minimal developing right suprahilar opacity. IMPRESSION: Significantly improved aeration, with decreased bibasilar atelectasis. Question minimal developing right suprahilar airspace disease. Recommend attention on follow-up. Electronically Signed   By: Abigail Miyamoto M.D.   On: 05/26/2021 08:37   DG Abd Portable 1V  Result Date: 05/27/2021 CLINICAL DATA:  Evaluate feeding tube placement. EXAM: PORTABLE ABDOMEN - 1 VIEW COMPARISON:  05/27/2021 FINDINGS: Portable radiograph of the abdomen shows interval placement of a feeding tube. The feeding tube has a configuration corresponding to the duodenal C loop with tip in the expected location of the duodenal jejunal junction. There is a nasogastric tube which is in the fundus of the stomach where retained enteric contrast material is identified. The nasogastric tube appears kinked upon itself. Right femoral central venous catheter is identified with tip projecting over the right-side of the L5 vertebral body. No dilated bowel loops identified. IMPRESSION: 1. Feeding tube tip is at the duodenal jejunal junction. The nasogastric tube appears kinked upon itself within the gastric fundus. 2. Right femoral central venous catheter tip projects over the L5 vertebral body. Electronically Signed   By: Kerby Moors M.D.   On: 05/27/2021 10:29   EEG adult  Result Date: 05/25/2021 Tyler Aas, MD     05/25/2021  5:30 PM [facility] Routine EEG Report Patient Name:   Andres Braun Date of Birth:   [birth year] Identification Number:   MRN - [id number] Date of Study:   05/25/2021 11:35:22 Indication: Encephalopathy, Medication: Versed, Fentanyl Technical Summary: A routine 20 channel electroencephalogram using the international 10-20 system of electrode placement  was performed. Background: 1-2 Hz, Poorly formed States      Coma Abnormalities Background Slowing: The background consists of 20-50 uV, 1-2 Hz diffuse activity with superimposed diffuse polymorphic delta activity that is non reactive to external stimulation. Other: - No Posterior Dominant Rhythm - Lact of reactivity Activation Procedures Hyperventilation: Not performed Photic Stimulation: Not performed Classification: Abnormal : Diagnosis: This is abnormal EEG due to: - Severe background slowing - No PDR - Lack of reactivity Clinical Correlation: The findings suggest severe diffuse cerebral dysfunction. There were no seizures or epileptiform activity seen. Dr Asa Saunas [facility] [phone] Case [id number]  VAS [place] ABI WITH/WO TBI  Result Date: 05/26/2021  LOWER EXTREMITY DOPPLER STUDY Patient Name:  Andres Braun [facility]  Date of Exam:   05/26/2021 Medical Rec #: [id number]  Accession #:    [id number] Date of Birth: 1975/07/27           Patient Gender: M Patient Age:   85 years Exam Location:  [facility] Procedure:      VAS [place] ABI WITH/WO TBI Referring Phys: Trevor Mace --------------------------------------------------------------------------------  Indications: Ischemic left lower extremity.  Limitations: Today's exam was limited due to waveforms may be unreliable due to              patient on pressors. Comparison Study: no prior Performing Technologist: Archie Patten RVS  Examination Guidelines: A complete evaluation includes at minimum, Doppler waveform signals and systolic blood pressure reading at the level of bilateral brachial, anterior tibial, and posterior tibial arteries, when vessel segments are accessible. Bilateral testing is considered an integral part of a complete examination. Photoelectric Plethysmograph (PPG) waveforms and toe systolic pressure readings are included as required and additional duplex testing as needed. Limited examinations for reoccurring  indications may be performed as noted.  ABI Findings: +---------+------------------+-----+---------+------------+  Right     Rt Pressure (mmHg) Index Waveform  Comment       +---------+------------------+-----+---------+------------+  Brachial                           triphasic               +---------+------------------+-----+---------+------------+  PTA       120                1.03  biphasic                +---------+------------------+-----+---------+------------+  DP                                           not audbible  +---------+------------------+-----+---------+------------+  Great Toe                          Absent                  +---------+------------------+-----+---------+------------+ +---------+------------------+-----+---------+-----------+  Left      Lt Pressure (mmHg) Index Waveform  Comment      +---------+------------------+-----+---------+-----------+  Brachial  116                      triphasic              +---------+------------------+-----+---------+-----------+  PTA       87                 0.75  biphasic               +---------+------------------+-----+---------+-----------+  DP                                           not audible  +---------+------------------+-----+---------+-----------+  Great Toe                          Absent                 +---------+------------------+-----+---------+-----------+ +-------+-----------+-----------+------------+------------+  ABI/TBI Today's ABI Today's TBI Previous ABI Previous TBI  +-------+-----------+-----------+------------+------------+  Right   1.03                                               +-------+-----------+-----------+------------+------------+  Left    0.75                                               +-------+-----------+-----------+------------+------------+  Summary: Right: Resting right ankle-brachial index is within normal range. No evidence of significant right lower extremity arterial disease. The right toe-brachial  index is abnormal. Left: Resting left ankle-brachial index indicates moderate left lower extremity arterial disease. The left toe-brachial index is abnormal.  *See table(s) above for measurements and observations.  Electronically signed by Deitra Mayo MD on 05/26/2021 at 3:28:39 PM.    Final    Medications:  sodium chloride 20 mL/hr at 05/27/21 1015   sodium chloride     cefTRIAXone (ROCEPHIN)  IV Stopped (05/27/21 1048)   feeding supplement (VITAL 1.5 CAL)     fentaNYL infusion INTRAVENOUS Stopped (05/26/21 0747)   heparin 1,300 Units/hr (05/27/21 1100)   midazolam Stopped (05/27/21 4707)   norepinephrine (LEVOPHED) Adult infusion 6 mcg/min (05/27/21 1100)   prismasol BGK 2/2.5 dialysis solution 2,000 mL/hr at 05/27/21 1031   prismasol BGK 2/2.5 replacement solution 150 mL/hr at 05/26/21 0151   prismasol BGK 2/2.5 replacement solution 100 mL/hr at 05/26/21 0154   vasopressin Stopped (05/26/21 2358)    B-complex with vitamin C  1 tablet Per Tube Daily   chlorhexidine gluconate (MEDLINE KIT)  15 mL Mouth Rinse BID   Chlorhexidine Gluconate Cloth  6 each Topical Q0600   docusate  100 mg Per Tube BID   feeding supplement (PROSource TF)  45 mL Per Tube TID   folic acid  1 mg Per Tube Daily   mouth rinse  15 mL Mouth Rinse 10 times per day   pantoprazole (PROTONIX) IV  40 mg Intravenous Q12H   polyethylene glycol  17 g Per Tube Daily   thiamine  100 mg Per Tube Daily    Assessment/ Plan: AKI severe: in setting of cardiac arrest, rhabdo.  Oliguric.  Requring CRRT - continue for now.  Cont 2K/2.5Ca fluids. PM labs.   Net net 50-100/hr as tolerated today.  SP cardiac arrest - asystolic arrest in setting of prob asp PNA/ ARDS in setting of EtOH Severe metabolic/ resp acidosis -   managed with CRRT and vent AHRF - suspected aspiration pneumonitis w/ ARDS, on antibiotics per primary. Hepatitis - shock, EtOH, rhabdo contributors; per primary AMS - concern for anoxic brain injury, MRI at  some point.   Jannifer Hick MD 05/27/2021, 11:23 AM  [facility] Pager: [phone]

## 2021-05-27 NOTE — Procedures (Signed)
Cortrak  Person Inserting Tube:  Alroy Dust, Annalee Meyerhoff L, RD Tube Type:  Cortrak - 43 inches Tube Size:  10 Tube Location:  Left nare Initial Placement:  Postpyloric Secured by: Bridle Technique Used to Measure Tube Placement:  Marking at nare/corner of mouth Cortrak Secured At:  94 cm  Cortrak Tube Team Note:  Consult received to place a Cortrak feeding tube.   X-ray is required, abdominal x-ray has been ordered by the Cortrak team. Please confirm tube placement before using the Cortrak tube.   If the tube becomes dislodged please keep the tube and contact the Cortrak team at [email or web address] (password [email or web address]) for replacement.  If after hours and replacement cannot be delayed, place a NG tube and confirm placement with an abdominal x-ray.    Roxana Hires, RD, LDN Clinical Dietitian See [facility] for contact information.

## 2021-05-27 NOTE — Care Management (Signed)
°  Transition of Care [facility]) Screening Note   Patient Details  Name: Andres Braun Date of Birth: May 12, 1975   Transition of [facility]) CM/SW Contact:    Bethena Roys, RN Phone Number: 05/27/2021, 1:30 PM    Transition of Care Department [facility]) has reviewed the patient and no TOC needs have been identified at this time. CSW assisting with finding family members. Patient is currently on vent and CRRT. Case Manager will continue to follow the patient for disposition needs.

## 2021-05-27 NOTE — Progress Notes (Addendum)
CSW was informed by RN that family has been located and communicated with for patient. Consult complete.

## 2021-05-27 NOTE — Progress Notes (Signed)
Nutrition Follow-up  DOCUMENTATION CODES:   Not applicable  INTERVENTION:   Tube Feeding via Cortrak (post-pyloric): Vital 1.5 at 55 ml/hr Pro-Source TF 45 mL TID Provides 2100 kcals, 122 g of protein and 1003 mL of free water   Add B complex with C with 1 mg of folic acid while on CRRT Add Thiamine 100 mg x 7 days given unknown EtOH use, reportedly drinking before arrest   OG with kink on abd xray today with tube coiled in fundus with tip pointing back towards GE junction; if pt requires further decompression, may benefit from repositioning tube. Discussed with RN   NUTRITION DIAGNOSIS:   Inadequate oral intake related to acute illness as evidenced by NPO status.  Being addressed via TF  GOAL:   Patient will meet greater than or equal to 90% of their needs  Progressing  MONITOR:   Vent status, TF tolerance, Weight trends, Labs  REASON FOR ASSESSMENT:   Ventilator, Consult Enteral/tube feeding initiation and management  ASSESSMENT:   46 yo male admitted post OOH cardiac arrest, acute respiratory failure post arrest, possible aspiration-intubated, AKI requiring CRRT. No PMH on file. Pt is spanish speaking  1/15 Admitted 1/16 TTM initiated  Pt remains on vent support; remains on CRRT. Levophed being titrated down  Pt with vomiting and TF on hold. Post pyloric Cortrak placed today with plans to resume TF. Pf note, pt vomiting during Cortrak placement  Abd xray does not report ileus, although given critical illness, delayed gastric emptying is definitely possible. OG tube kinked/looped in fundus with tip pointed back towards GE junction; this positioning may have contributed to pt's episodes of emesis as well   OG tube with 300 mL out in 24 hours; 250 mL today  Pt with minimal UOP at present  Labs: reviewed Meds: B complex with C, folic acid, colace  Diet Order:   Diet Order             Diet NPO time specified  Diet effective now                    EDUCATION NEEDS:   Not appropriate for education at this time  Skin:  Skin Assessment: Reviewed RN Assessment  Last BM:  1/15  Height:   Ht Readings from Last 1 Encounters:  05/24/21 6' (1.829 m)    Weight:   Wt Readings from Last 1 Encounters:  05/27/21 93.5 kg    Ideal Body Weight:     BMI:  Body mass index is 27.96 kg/m.  Estimated Nutritional Needs:   Kcal:  2100-2300 kcals  Protein:  120-140 g  Fluid:  >/= 2 L   Kerman Passey MS, RDN, LDN, CNSC Registered Dietitian III Clinical Nutrition RD Pager and On-Call Pager Number Located in [place]

## 2021-05-28 ENCOUNTER — Inpatient Hospital Stay (HOSPITAL_COMMUNITY): Payer: Self-pay

## 2021-05-28 LAB — COMPREHENSIVE METABOLIC PANEL
ALT: 896 U/L — ABNORMAL HIGH (ref 0–44)
AST: 1193 U/L — ABNORMAL HIGH (ref 15–41)
Albumin: 2 g/dL — ABNORMAL LOW (ref 3.5–5.0)
Alkaline Phosphatase: 110 U/L (ref 38–126)
Anion gap: 8 (ref 5–15)
BUN: 33 mg/dL — ABNORMAL HIGH (ref 6–20)
CO2: 26 mmol/L (ref 22–32)
Calcium: 7.7 mg/dL — ABNORMAL LOW (ref 8.9–10.3)
Chloride: 102 mmol/L (ref 98–111)
Creatinine, Ser: 2.75 mg/dL — ABNORMAL HIGH (ref 0.61–1.24)
GFR, Estimated: 28 mL/min — ABNORMAL LOW (ref >60)
Glucose, Bld: 130 mg/dL — ABNORMAL HIGH (ref 70–99)
Potassium: 3.6 mmol/L (ref 3.5–5.1)
Sodium: 136 mmol/L (ref 135–145)
Total Bilirubin: 0.6 mg/dL (ref 0.3–1.2)
Total Protein: 5.6 g/dL — ABNORMAL LOW (ref 6.5–8.1)

## 2021-05-28 LAB — CBC
HCT: 31.1 % — ABNORMAL LOW (ref 39.0–52.0)
Hemoglobin: 10.5 g/dL — ABNORMAL LOW (ref 13.0–17.0)
MCH: 29 pg (ref 26.0–34.0)
MCHC: 33.8 g/dL (ref 30.0–36.0)
MCV: 85.9 fL (ref 80.0–100.0)
Platelets: 93 10*3/uL — ABNORMAL LOW (ref 150–400)
RBC: 3.62 MIL/uL — ABNORMAL LOW (ref 4.22–5.81)
RDW: 14 % (ref 11.5–15.5)
WBC: 11.9 10*3/uL — ABNORMAL HIGH (ref 4.0–10.5)
nRBC: 0.7 % — ABNORMAL HIGH (ref 0.0–0.2)

## 2021-05-28 LAB — GLUCOSE, CAPILLARY
Glucose-Capillary: 101 mg/dL — ABNORMAL HIGH (ref 70–99)
Glucose-Capillary: 119 mg/dL — ABNORMAL HIGH (ref 70–99)
Glucose-Capillary: 123 mg/dL — ABNORMAL HIGH (ref 70–99)
Glucose-Capillary: 132 mg/dL — ABNORMAL HIGH (ref 70–99)
Glucose-Capillary: 133 mg/dL — ABNORMAL HIGH (ref 70–99)
Glucose-Capillary: 135 mg/dL — ABNORMAL HIGH (ref 70–99)
Glucose-Capillary: 87 mg/dL (ref 70–99)

## 2021-05-28 LAB — RENAL FUNCTION PANEL
Albumin: 1.9 g/dL — ABNORMAL LOW (ref 3.5–5.0)
Anion gap: 16 — ABNORMAL HIGH (ref 5–15)
BUN: 36 mg/dL — ABNORMAL HIGH (ref 6–20)
CO2: 24 mmol/L (ref 22–32)
Calcium: 8.4 mg/dL — ABNORMAL LOW (ref 8.9–10.3)
Chloride: 97 mmol/L — ABNORMAL LOW (ref 98–111)
Creatinine, Ser: 2.71 mg/dL — ABNORMAL HIGH (ref 0.61–1.24)
GFR, Estimated: 29 mL/min — ABNORMAL LOW (ref >60)
Glucose, Bld: 131 mg/dL — ABNORMAL HIGH (ref 70–99)
Phosphorus: 2.6 mg/dL (ref 2.5–4.6)
Potassium: 3.7 mmol/L (ref 3.5–5.1)
Sodium: 137 mmol/L (ref 135–145)

## 2021-05-28 LAB — HEPARIN LEVEL (UNFRACTIONATED): Heparin Unfractionated: 0.48 IU/mL (ref 0.30–0.70)

## 2021-05-28 LAB — MAGNESIUM: Magnesium: 2.9 mg/dL — ABNORMAL HIGH (ref 1.7–2.4)

## 2021-05-28 LAB — APTT: aPTT: 67 seconds — ABNORMAL HIGH (ref 24–36)

## 2021-05-28 LAB — PHOSPHORUS: Phosphorus: 2.9 mg/dL (ref 2.5–4.6)

## 2021-05-28 IMAGING — DX DG ABDOMEN 1V
1 series · 1 of 1 positions shown · non-contrast
Comparison: Abdominal radiograph [DATE]

CLINICAL DATA: Ileus. History of endotracheal tube. Cardiac arrest.

EXAM:
ABDOMEN - 1 VIEW

[abdomen supine]
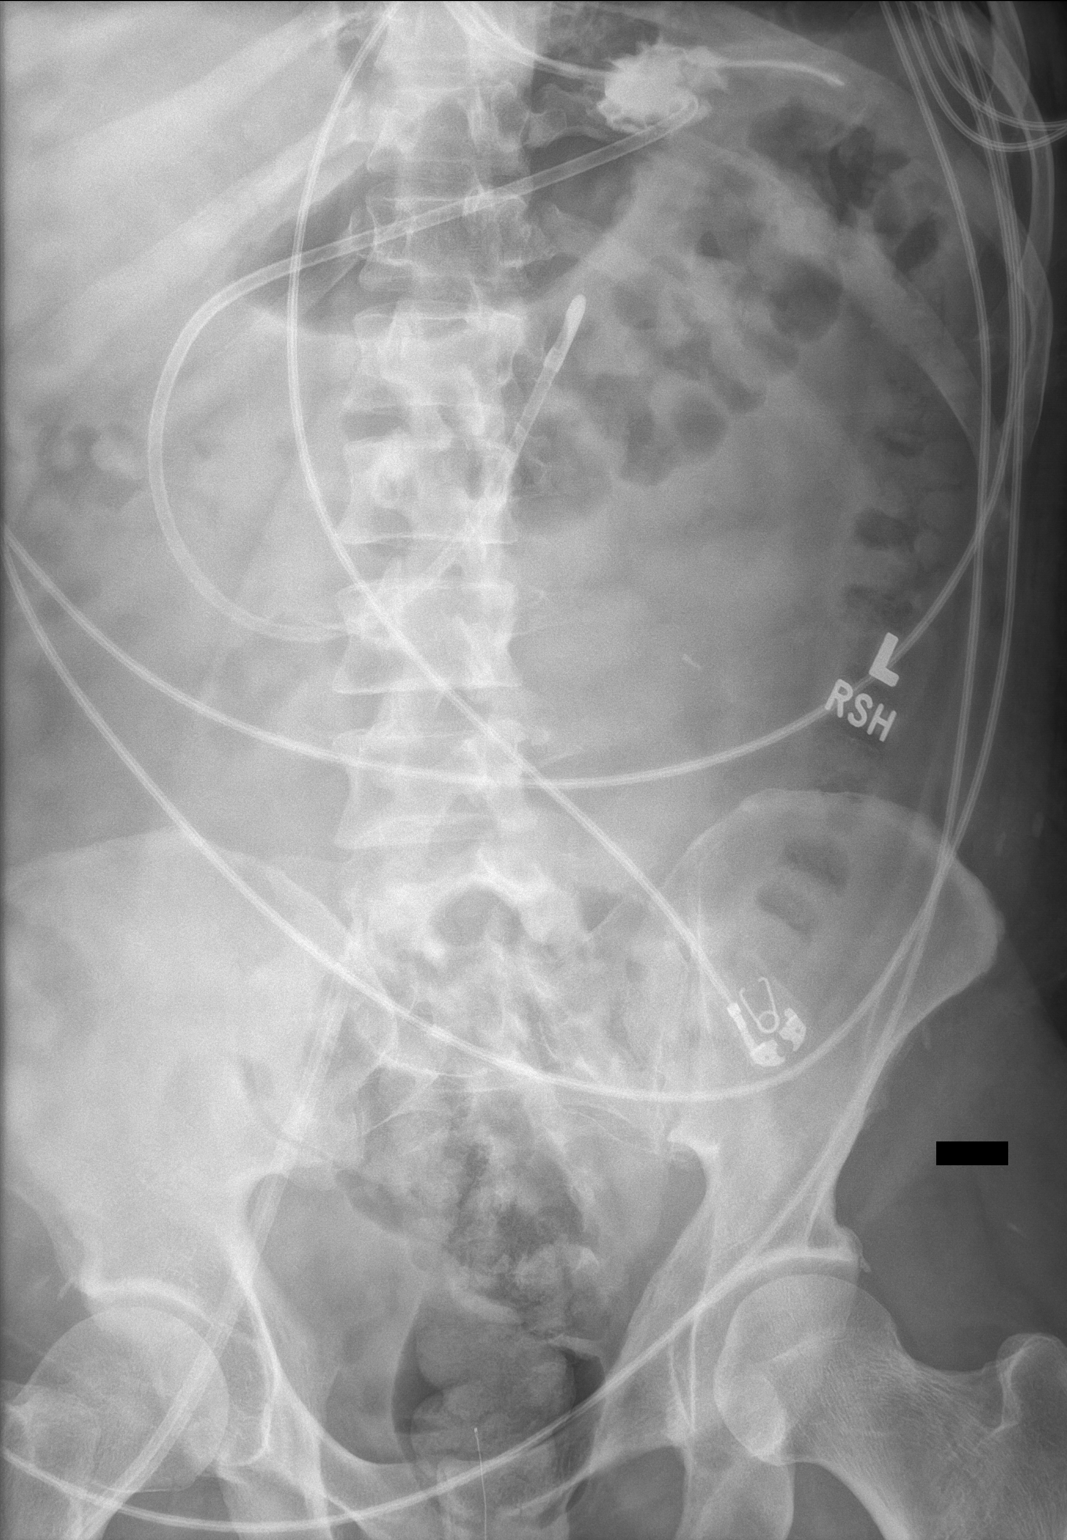

[1 of 1 positions shown; findings below may reference images not displayed]

FINDINGS: A weighted tip endotracheal tube again extends through the stomach
and likely into the proximal duodenum with the tip overlying the
junction at the duodenal jejunal junction. There again is likely an
additional nasogastric tube with tip within the more proximal
stomach. There is again contrast material seen overlying the
proximal stomach similar to prior.

Right femoral approach central venous catheter tip again overlies
the right L5 vertebral body.

No dilated loops of bowel identified.
IMPRESSION: Feeding tube with tip at the duodenal jejunal junction.

Partial visualization of nasogastric tube with tip overlying the
proximal stomach.

## 2021-05-28 IMAGING — DX DG CHEST 1V PORT
1 series · 1 of 1 positions shown · non-contrast
Comparison: Portable chest [DATE] and earlier.

CLINICAL DATA: 45-year-old male status post cardiac arrest.

EXAM:
PORTABLE CHEST 1 VIEW

[chest]
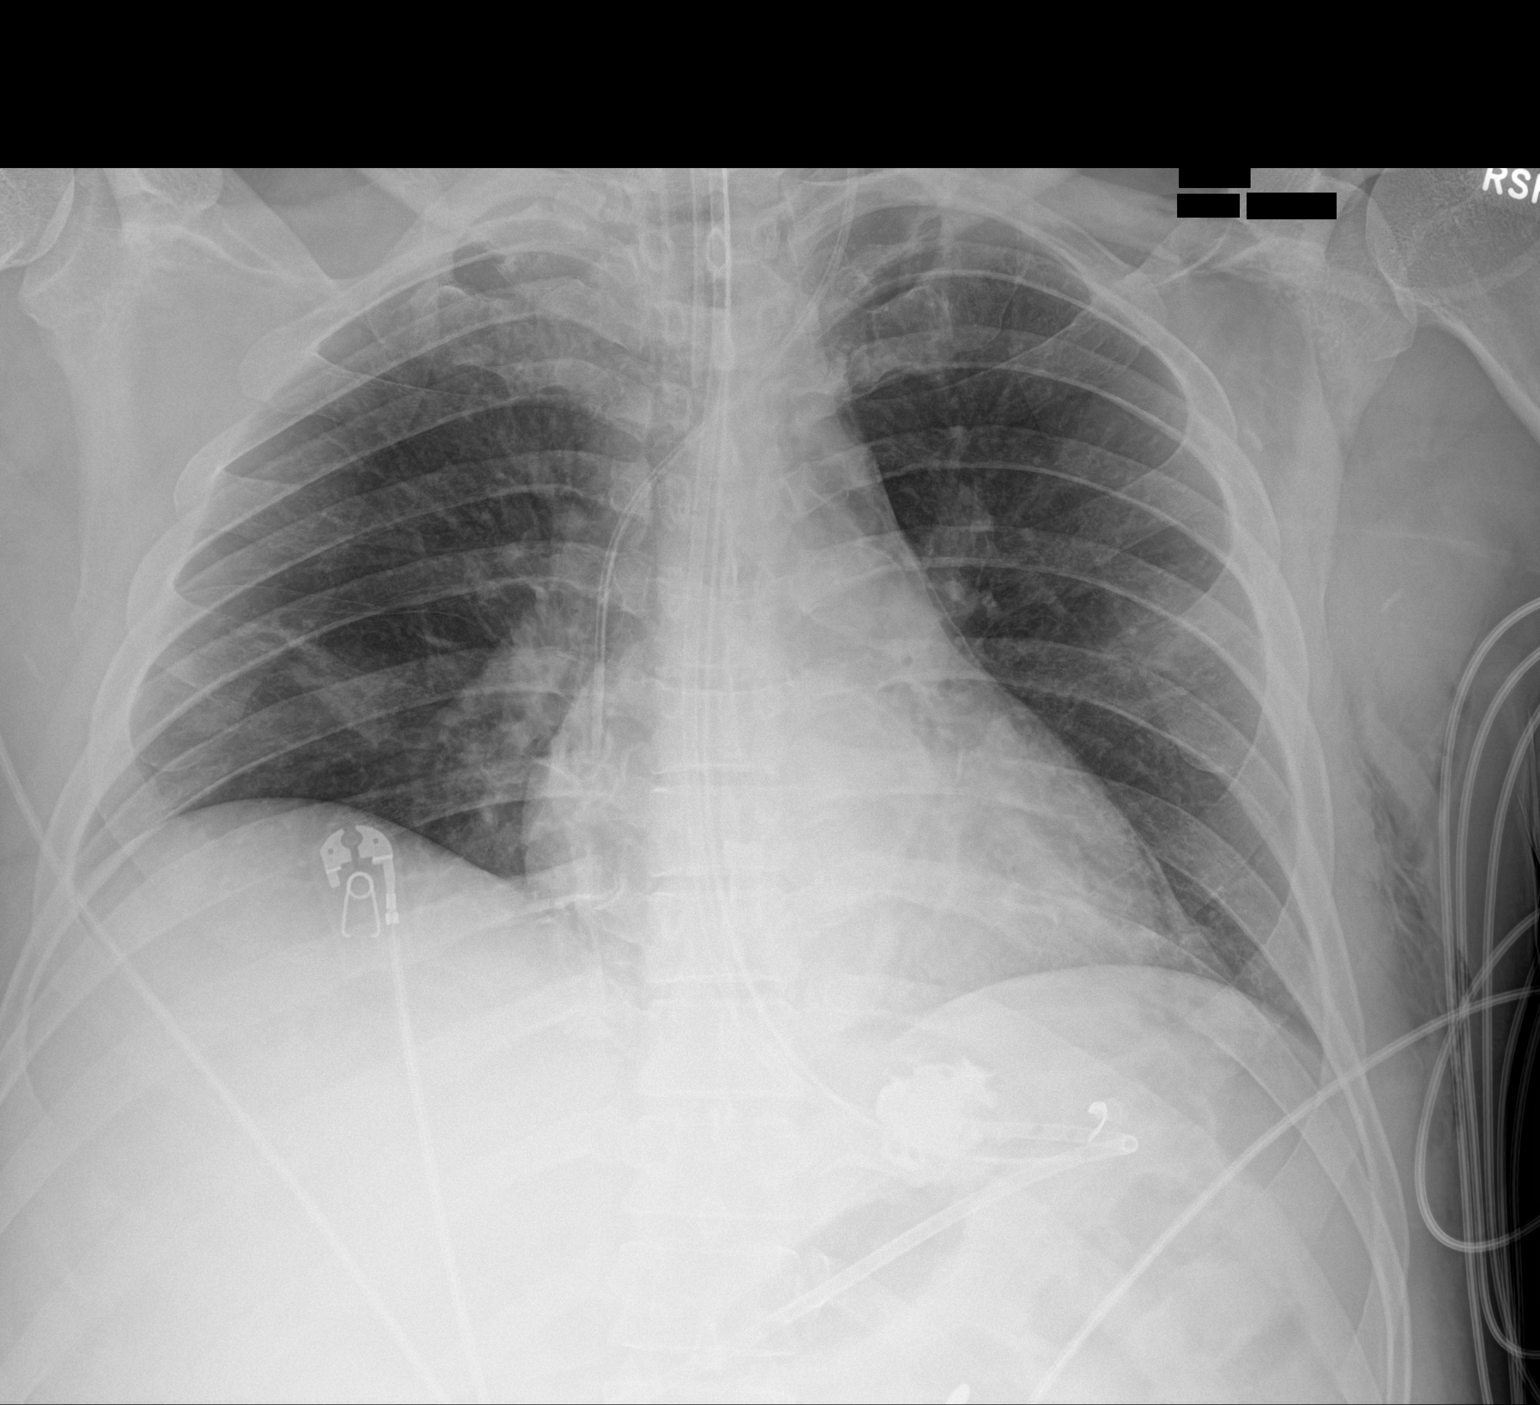

[1 of 1 positions shown; findings below may reference images not displayed]

FINDINGS: Portable AP semi upright view at [RM] hours. Stable lines and tubes.
Regressed but not resolved pneumomediastinum since yesterday,
although subcutaneous gas now visible in the left supraclavicular
fossa. Questionable left chest wall gas also now. But no
pneumothorax identified. Mediastinal contours remain normal. Stable
lung volumes. Mild patchy peribronchial opacity in the right lower
lung is new. No pulmonary edema or other confluent opacity. Stable
small volume contrast at the gastric fundus. Negative visible bowel
gas. Stable visualized osseous structures.
IMPRESSION: 1. Stable lines and tubes.

2. Regressed but not resolved pneumomediastinum since yesterday, and
subcutaneous gas now visible in the left supraclavicular fossa -
likely tracking from the chest.
Continue to suspect this is incidental, such as barotrauma related.
And no pneumothorax is identified.

3. New patchy right lower lung peribronchial opacity, favor
atelectasis.

## 2021-05-28 MED ORDER — HYDRALAZINE HCL 20 MG/ML IJ SOLN: 10.0000 mg | Freq: Four times a day (QID) | INTRAMUSCULAR | Status: DC | PRN

## 2021-05-28 MED ORDER — SODIUM CHLORIDE 0.9 % FOR CRRT: INTRAVENOUS_CENTRAL | Status: DC | PRN

## 2021-05-28 MED ORDER — PRISMASOL BGK 4/2.5 32-4-2.5 MEQ/L REPLACEMENT SOLN: Status: DC

## 2021-05-28 MED ORDER — POLYETHYLENE GLYCOL 3350 17 G PO PACK
17.0000 g | PACK | Freq: Two times a day (BID) | ORAL | Status: DC
  Administered 2021-05-28: 17 g
  Filled 2021-05-28 (×2): qty 1

## 2021-05-28 MED ORDER — PRISMASOL BGK 4/2.5 32-4-2.5 MEQ/L EC SOLN: Status: DC

## 2021-05-28 MED ORDER — HEPARIN SODIUM (PORCINE) 5000 UNIT/ML IJ SOLN
5000.0000 [IU] | Freq: Three times a day (TID) | INTRAMUSCULAR | Status: DC
  Administered 2021-05-28 – 2021-07-07 (×107): 5000 [IU] via SUBCUTANEOUS
  Filled 2021-05-28 (×106): qty 1

## 2021-05-28 NOTE — Procedures (Signed)
Extubation Procedure Note  Patient Details:   Name: Andres Braun DOB: October 09, 1975 MRN: [id number]   Airway Documentation:    Vent end date: 05/28/21 Vent end time: 1432   Evaluation  O2 sats: stable throughout Complications: No apparent complications Patient did tolerate procedure well. Bilateral Breath Sounds: Clear, Diminished   Yes  Patient was extubated to a 4L [place] without any complications, dyspnea or stridor noted. Positive cuff leak prior to extubation.   Carlynn Spry 05/28/2021, 2:32 PM

## 2021-05-28 NOTE — Progress Notes (Signed)
ANTICOAGULATION CONSULT NOTE   Pharmacy Consult for IV heparin Indication:  possible arterial thrombus  No Known Allergies  Patient Measurements: Height: 6' (182.9 cm) Weight: 89.9 kg (198 lb 3.1 oz) IBW/kg (Calculated) : 77.6 Heparin Dosing Weight: ~ 90 kg  Vital Signs: Temp: 97.9 F (36.6 C) (01/19 0800) Temp Source: Bladder (01/19 0800) BP: 142/90 (01/19 0800) Pulse Rate: 111 (01/19 0800)  Labs: Recent Labs    05/26/21 0438 05/26/21 0917 05/26/21 2017 05/27/21 0330 05/27/21 0352 05/27/21 0931 05/27/21 1535 05/28/21 0347  HGB  --    < > 11.7* 11.3*  --  10.5* 10.5* 10.5*  HCT  --    < > 35.5* 34.8*  --  33.3* 32.0* 31.1*  PLT  --    < > 103* 101*  --  103* 95* 93*  APTT 36  --   --  92*  --   --   --  67*  HEPARINUNFRC  --   --  0.53 0.68  --   --   --  0.48  CREATININE 3.85*   < >  --   --  3.26*  --  3.04* 2.75*   < > = values in this interval not displayed.     Estimated Creatinine Clearance: 37.2 mL/min (A) (by C-G formula based on SCr of 2.75 mg/dL (H)).  Medications:  Infusions:   sodium chloride Stopped (05/27/21 1413)   sodium chloride     cefTRIAXone (ROCEPHIN)  IV Stopped (05/27/21 1048)   feeding supplement (VITAL 1.5 CAL) 35 mL/hr at 05/28/21 0024   fentaNYL infusion INTRAVENOUS Stopped (05/26/21 0747)   midazolam Stopped (05/27/21 2703)   prismasol BGK 2/2.5 dialysis solution 2,000 mL/hr at 05/28/21 0924   prismasol BGK 2/2.5 replacement solution 150 mL/hr at 05/27/21 1200   prismasol BGK 2/2.5 replacement solution 100 mL/hr at 05/28/21 0505    Assessment: 46 yo male admitted post arrest, now with cold extremities, concern for arterial thrombus vs. Vasoconstriction from high-dose pressors.  Pharmacy asked to begin IV heparin 1/17.    Heparin level at goal this AM at 0.48. No issues or bleeding reported.  Hgb stable, platelet count low slowly drifting down.  No overt bleeding or complications noted.  Goal of Therapy:  Heparin level 0.3-0.7  units/ml Monitor platelets by anticoagulation protocol: Yes   Plan:  Extremities improving with the cessation of pressors- warm, appear perfused. Discussed with Cloyd Stagers PA-C, will discontinue IV heparin. Resume DVT px heparin 5000 units sq TID.  Reece Leader, Colon Flattery, BCCP Clinical Pharmacist  05/28/2021 9:31 AM   [facility] pharmacy phone numbers are listed on [email or web address]

## 2021-05-28 NOTE — Progress Notes (Addendum)
NAME:  Andres Braun, MRN:  [id number], DOB:  Aug 07, 1975, LOS: 4 ADMISSION DATE:  05/24/2021, CONSULTATION DATE:  05/28/21 REFERRING MD:  EDP, CHIEF COMPLAINT:  Cardiac arrest   History of Present Illness:  46 year old man admitted after cardiac arrest at home. PEA arrest, s/p CPR x 20 minutes and Epi x 5. Patient reportedly out drinking with his friends the night prior to admission.  Found by friends with agonal breathing.  Lost pulse.    Pertinent Medical History:  Unknown  Significant Hospital Events: Including procedures, antibiotic start and stop dates in addition to other pertinent events   1/15:  20 minutes of CPR, 5 rounds of epinephrine.  ROSC was obtained.  Rhythm PEA per report.  Emesis noted all around him. On arrival to ED, severely acidemic pH 6.7.  Refractory acidemia.  Intubated. Difficult to ventilate.  Eventually requiring dose of paralytic after adequate sedation.  Labs notable for renal failure, elevated LFTs.  CK in the 14,000 range. Started on ceftriaxone for aspiration PNA, Nephro consulted. Started on CRRT.  CVL and HD cath placed.  1/16: hypotensive overnight. Having Anoxic myoclonus during evening hrs. Treated w/ Versed. Having difficulty w/ HD cath clotting due to Curved ports affecting flow.  EEG suggested diffuse cerebral dysfunction.  There is no epileptiform activity or seizures.  Right IJ dialysis catheter removed due to malfunction, right femoral dialysis catheter placed.  Social work working on family contacts.  Temperature management initiated to avoid fever 1/17: Hemodynamics a little better.  Sedation minimized.  Exam a little improved compared to 1/16 spontaneously moving left upper extremity to stimulus, more forceful cough, opens eyes.  Lower extremity pulses no longer palpable or audible via Doppler. IV heparin stared. Dopplers ordered  1/18: Hemodynamics continue to improve, decreasing pressor requirements. Improving neuro exam, will weakly follow commands  when asked in Spanish. Readily opens eyes to voice. BLE pedal pulses now +doppler. Cortrak placed. 1/19: Improved residuals s/p Cortrak, though emesis on placement. Emesis overnight. Minimal secretions. Mild agitation/gagging, waking up/intermittently following commands. Off of pressors.  Interim History / Subjective:  Improved residuals s/p Cortrak placement (post-pyloric) Emesis on placement with additional episode of emesis overnight Repeat KUB Minimal secretions, decreased vent support Off of pressors, SBPs up to 180s, 190s with agitation Mild agitation/gagging, waking up/intermittently following commands CVP 8-9  Objective:  Blood pressure (!) 131/91, pulse (!) 107, temperature 97.9 F (36.6 C), resp. rate (!) 32, height 6' (1.829 m), weight 89.9 kg, SpO2 100 %. CVP:  [4 mmHg-34 mmHg] 5 mmHg  Vent Mode: PRVC FiO2 (%):  [40 %] 40 % Set Rate:  [32 bmp] 32 bmp Vt Set:  [540 mL] 540 mL PEEP:  [5 cmH20-12 cmH20] 5 cmH20 Plateau Pressure:  [20 cmH20-29 cmH20] 20 cmH20   Intake/Output Summary (Last 24 hours) at 05/28/2021 0739 Last data filed at 05/28/2021 0700 Gross per 24 hour  Intake 1766.37 ml  Output 5587 ml  Net -3820.63 ml    Filed Weights   05/26/21 0300 05/27/21 0330 05/28/21 0459  Weight: 95.5 kg 93.5 kg 89.9 kg   Physical Examination: General: Acutely ill-appearing middle-aged man in NAD. Sedated on vent. HEENT: [employer]/AT, anicteric sclera, PERRL 57mm, moist mucous membranes. ETT/Cortrak in place. Neuro: Sedated. Responds to verbal stimuli. Following commands intermittently. Moves all 4 extremities spontaneously. +Corneal, +Cough, and +Gag  CV: Tachycardic, regular rhythm, no m/g/r. PULM: Breathing even and unlabored on vent (PEEP 5, FiO2 40%). Lung fields with faint crackles bilaterally. GI: Soft, nontender, mildly  distended. Hypoactive bowel sounds. Extremities: Bilateral symmetric 1+ LE edema noted. Skin: Warm/dry, no significant rashes. R anterior shin with  ecchymosis/prior blister 2/2 IO.  Resolved Hospital Problem List:     Assessment & Plan:  Cardiac arrest Reported PEA arrest. 20 minutes of CPR, 5 doses of epi.  Emesis noted all over patient.  Suspect respiratory event, pneumonia, aspiration likely. - Continue telemetry - Supportive care as below  Circulatory shock, improving Favor mix of septic shock from aspiration and post-arrest vasoplegia. His ejection fraction is 55 to 60% with normal regional wall function. CVP 28 on admission. - NE weaned to off, has remained off of Neo/vaso - Now hypertensive with agitation with SBPs 180s-190s - CVP 8-12, 9 today 1/19 - Holding diuresis, pulling volume with CRRT (5L removed 1/18-1/19)  Cold lower extremities with no palpable pulses, improving Suspect at least to some extent due to shock state. Improved 1/18 with bilateral DP/PT pulses present with doppler. - Discontinue IV heparin today, 1/19 given significant improvement in limb appearance/warmth and doppler signals; now off pressors as well - SQH - Low threshold for VVS involvement if ongoing concern  Acute Hypoxemic Respiratory Failure Due to aspiration pneumonia and subsequent cardiac arrest. - Continue full vent support (4-8cc/kg IBW, 7 at present, may reduce to 6) - Wean FiO2 for O2 sat > 90% - Daily WUA/SBT, given significant decrease in PEEP (5 from 12) - VAP bundle - Pulmonary hygiene - PAD protocol for sedation: Fentanyl and Versed for goal RASS 0 to -1 - Avoid proning in the setting of neurologic injury - Ceftriaxone x 5-day course (end 1/19) - Follow intermittent CXR  Acute metabolic encephalopathy favoring postarrest anoxic encephalopathy. Initial CT brain negative, downtime in excess of 20 minutes, EEG suggesting fairly significant neurological dysfunction however exam today a little improved however overall still concerned about functional recovery - Discontinue MRI Brain, as patient is following commands/wakes  appropriately - Minimizing sedation as able - Communication with patient should be in Spanish (with interpreter) if at all possible  Gastric ileus with high gastric output - Cortrak placed 1/18 - Improved residuals with post-pyloric tube location - Continue TF - Escalate bowel regimen - Repeat KUB - Consider Reglan  Acute renal failure Rhabdomyolysis - Nephro following, appreciate assistance - Continuing CRRT, tolerating higher UF with improved hemodynamics - Trend BMP - Replete electrolytes as indicated - Monitor I&Os - Avoid nephrotoxic agents as able - Ensure adequate renal perfusion  Mixed metabolic and respiratory acidosis, improving Acid-base improving with CRRT. - Continue CRRT - Vent weaning as tolerated  Bloody OG output: Possibly traumatic. - PPI BID  Elevated liver enzymes:  T bili not Phos normal.  Suspect due to shock.  Likely related to rhabdomyolysis.   - Trend LFTs to normal - Downtrending 1/19AM  R anterior knee wound - Likely r/t prior IO placement - WOC consult  Best Practice (right click and "Reselect all SmartList Selections" daily)   Diet/type: tubefeeds via CorTrak DVT prophylaxis: systemic heparin GI prophylaxis: PPI Lines: Central line, Dialysis Catheter, and Arterial Line Foley:  Yes, and it is still needed Code Status:  full code Last date of multidisciplinary goals of care discussion: N/A. We have a contact number of a spouse - apparently separated from this woman; however, also has a cousin.  None of which who speak [place].  We will continue to try to reach out via interpreter.  Critical care time: 40 minutes   Tim LairStephanie M Karston Hyland, PA-C [facility]  05/28/21 7:39 AM  Please see [email or web address] for pager details.  From 7A-7P if no response, please call [phone] After hours, please call ELink [phone]

## 2021-05-28 NOTE — Progress Notes (Signed)
[facility] Progress Note   Subjective:   I/Os yesterday 1.8 / 5.6, neg + 3.8 L for admission. Off pressors, off sedation; remains intubated.  Objective Vitals:   05/28/21 0800 05/28/21 0900 05/28/21 1000 05/28/21 1123  BP: (!) 142/90 (!) 128/91 (!) 115/92   Pulse: (!) 111 (!) 107 (!) 110   Resp: (!) 32 (!) 32 (!) 25   Temp: 97.9 F (36.6 C) 97.9 F (36.6 C) 97.9 F (36.6 C) (!) 97 F (36.1 C)  TempSrc: Bladder   Axillary  SpO2: 100% 100% 100%   Weight:      Height:       Physical Exam General: intubated, sleeping Heart: RRR, no rub Lungs: coarse BL Abdomen: soft Extremities: trace edema, feet less mottled, calves warm, R knee with developing bullous lesion Dialysis Access:  RIJ temp HD catheter   Additional Objective Labs: Basic Metabolic Panel: Recent Labs  Lab 05/27/21 0352 05/27/21 1535 05/28/21 0347  NA 134* 135 136  K 4.5 4.0 3.6  CL 101 100 102  CO2 24 24 26   GLUCOSE 131* 155* 130*  BUN 34* 34* 33*  CREATININE 3.26* 3.04* 2.75*  CALCIUM 7.1* 7.6* 7.7*  PHOS 5.5* 5.0* 2.9    Liver Function Tests: Recent Labs  Lab 05/24/21 1406 05/25/21 0416 05/26/21 0916 05/26/21 1600 05/27/21 0352 05/27/21 1535 05/28/21 0347  AST 2,052*  --  3,852*  --   --   --  1,193*  ALT 1,832*  --  1,756*  --   --   --  896*  ALKPHOS 103  --  86  --   --   --  110  BILITOT 0.4  --  0.7  --   --   --  0.6  PROT 4.4*  --  5.0*  --   --   --  5.6*  ALBUMIN 2.4*   < > 2.2*   < > 2.0* 2.0* 2.0*   < > = values in this interval not displayed.    No results for input(s): LIPASE, AMYLASE in the last 168 hours. CBC: Recent Labs  Lab 05/24/21 0916 05/24/21 0931 05/26/21 2017 05/27/21 0330 05/27/21 0931 05/27/21 1535 05/28/21 0347  WBC 9.6   < > 17.5* 14.4* 14.9* 13.2* 11.9*  NEUTROABS 5.2  --   --   --   --   --   --   HGB 15.9   < > 11.7* 11.3* 10.5* 10.5* 10.5*  HCT 53.1*   < > 35.5* 34.8* 33.3* 32.0* 31.1*  MCV 95.5   < > 86.4 86.6 87.4 87.0 85.9   PLT 256   < > 103* 101* 103* 95* 93*   < > = values in this interval not displayed.    Blood Culture No results found for: SDES, SPECREQUEST, CULT, REPTSTATUS  Cardiac Enzymes: Recent Labs  Lab 05/24/21 0932 05/25/21 0416  CKTOTAL 14,071* >50,000*    CBG: Recent Labs  Lab 05/27/21 1924 05/28/21 0001 05/28/21 0348 05/28/21 0805 05/28/21 1130  GLUCAP 141* 133* 123* 135* 119*    Iron Studies: No results for input(s): IRON, TIBC, TRANSFERRIN, FERRITIN in the last 72 hours. @lablastinr3 @ Studies/Results: DG Abd 1 View  Result Date: 05/28/2021 CLINICAL DATA:  Ileus. History of endotracheal tube. Cardiac arrest. EXAM: ABDOMEN - 1 VIEW COMPARISON:  Abdominal radiograph 05/27/2021 FINDINGS: A weighted tip endotracheal tube again extends through the stomach and likely into the proximal duodenum with the tip overlying the junction at the duodenal jejunal junction.  There again is likely an additional nasogastric tube with tip within the more proximal stomach. There is again contrast material seen overlying the proximal stomach similar to prior. Right femoral approach central venous catheter tip again overlies the right L5 vertebral body. No dilated loops of bowel identified. IMPRESSION: Feeding tube with tip at the duodenal jejunal junction. Partial visualization of nasogastric tube with tip overlying the proximal stomach. Electronically Signed   By: Yvonne Kendall M.D.   On: 05/28/2021 11:51   DG Chest Port 1 View  Result Date: 05/28/2021 CLINICAL DATA:  46 year old male status post cardiac arrest. EXAM: PORTABLE CHEST 1 VIEW COMPARISON:  Portable chest 05/27/2021 and earlier. FINDINGS: Portable AP semi upright view at 0512 hours. Stable lines and tubes. Regressed but not resolved pneumomediastinum since yesterday, although subcutaneous gas now visible in the left supraclavicular fossa. Questionable left chest wall gas also now. But no pneumothorax identified. Mediastinal contours remain  normal. Stable lung volumes. Mild patchy peribronchial opacity in the right lower lung is new. No pulmonary edema or other confluent opacity. Stable small volume contrast at the gastric fundus. Negative visible bowel gas. Stable visualized osseous structures. IMPRESSION: 1. Stable lines and tubes. 2. Regressed but not resolved pneumomediastinum since yesterday, and subcutaneous gas now visible in the left supraclavicular fossa - likely tracking from the chest. Continue to suspect this is incidental, such as barotrauma related. And no pneumothorax is identified. 3. New patchy right lower lung peribronchial opacity, favor atelectasis. Electronically Signed   By: Genevie Ann M.D.   On: 05/28/2021 08:57   DG Chest Port 1 View  Result Date: 05/27/2021 CLINICAL DATA:  46 year old male status post cardiac arrest. Intubated. EXAM: PORTABLE CHEST 1 VIEW COMPARISON:  Portable chest 05/26/2021 and earlier. FINDINGS: Portable AP semi upright view at 0530 hours. Endotracheal tube tip in good position between the clavicles and carina. Stable left IJ central line. Enteric tube looped in the stomach, side hole at the fundus. Small volume oral contrast at the gastric fundus unchanged. Pacer or resuscitation pads again project over the chest. Small volume pneumomediastinum suspected. But cardiac and mediastinal contours remain normal. No pneumothorax identified. Allowing for portable technique the lungs are clear. No pulmonary edema. No rib fracture identified. No acute osseous abnormality identified. Negative visible bowel gas. No neck or chest wall gas identified. IMPRESSION: 1.  Stable lines and tubes. 2. Small volume pneumomediastinum, but cardiac and mediastinal contours remain normal. This is probably incidental, with no pneumothorax or acute pulmonary abnormality identified. Electronically Signed   By: Genevie Ann M.D.   On: 05/27/2021 06:28   DG Abd Portable 1V  Result Date: 05/27/2021 CLINICAL DATA:  Evaluate feeding tube  placement. EXAM: PORTABLE ABDOMEN - 1 VIEW COMPARISON:  05/27/2021 FINDINGS: Portable radiograph of the abdomen shows interval placement of a feeding tube. The feeding tube has a configuration corresponding to the duodenal C loop with tip in the expected location of the duodenal jejunal junction. There is a nasogastric tube which is in the fundus of the stomach where retained enteric contrast material is identified. The nasogastric tube appears kinked upon itself. Right femoral central venous catheter is identified with tip projecting over the right-side of the L5 vertebral body. No dilated bowel loops identified. IMPRESSION: 1. Feeding tube tip is at the duodenal jejunal junction. The nasogastric tube appears kinked upon itself within the gastric fundus. 2. Right femoral central venous catheter tip projects over the L5 vertebral body. Electronically Signed   By: Queen Slough.D.  On: 05/27/2021 10:29   VAS [place] ABI WITH/WO TBI  Result Date: 05/26/2021  LOWER EXTREMITY DOPPLER STUDY Patient Name:  Andres Braun [facility]  Date of Exam:   05/26/2021 Medical Rec #: [id number]          Accession #:    [id number] Date of Birth: Sep 22, 1975           Patient Gender: M Patient Age:   46 years Exam Location:  [facility] Procedure:      VAS [place] ABI WITH/WO TBI Referring Phys: Trevor Mace --------------------------------------------------------------------------------  Indications: Ischemic left lower extremity.  Limitations: Today's exam was limited due to waveforms may be unreliable due to              patient on pressors. Comparison Study: no prior Performing Technologist: Archie Patten RVS  Examination Guidelines: A complete evaluation includes at minimum, Doppler waveform signals and systolic blood pressure reading at the level of bilateral brachial, anterior tibial, and posterior tibial arteries, when vessel segments are accessible. Bilateral testing is considered an integral part of a complete examination.  Photoelectric Plethysmograph (PPG) waveforms and toe systolic pressure readings are included as required and additional duplex testing as needed. Limited examinations for reoccurring indications may be performed as noted.  ABI Findings: +---------+------------------+-----+---------+------------+  Right     Rt Pressure (mmHg) Index Waveform  Comment       +---------+------------------+-----+---------+------------+  Brachial                           triphasic               +---------+------------------+-----+---------+------------+  PTA       120                1.03  biphasic                +---------+------------------+-----+---------+------------+  DP                                           not audbible  +---------+------------------+-----+---------+------------+  Great Toe                          Absent                  +---------+------------------+-----+---------+------------+ +---------+------------------+-----+---------+-----------+  Left      Lt Pressure (mmHg) Index Waveform  Comment      +---------+------------------+-----+---------+-----------+  Brachial  116                      triphasic              +---------+------------------+-----+---------+-----------+  PTA       87                 0.75  biphasic               +---------+------------------+-----+---------+-----------+  DP                                           not audible  +---------+------------------+-----+---------+-----------+  Great Toe  Absent                 +---------+------------------+-----+---------+-----------+ +-------+-----------+-----------+------------+------------+  ABI/TBI Today's ABI Today's TBI Previous ABI Previous TBI  +-------+-----------+-----------+------------+------------+  Right   1.03                                               +-------+-----------+-----------+------------+------------+  Left    0.75                                                +-------+-----------+-----------+------------+------------+  Summary: Right: Resting right ankle-brachial index is within normal range. No evidence of significant right lower extremity arterial disease. The right toe-brachial index is abnormal. Left: Resting left ankle-brachial index indicates moderate left lower extremity arterial disease. The left toe-brachial index is abnormal.  *See table(s) above for measurements and observations.  Electronically signed by Deitra Mayo MD on 05/26/2021 at 3:28:39 PM.    Final    Medications:  sodium chloride Stopped (05/28/21 6546)   sodium chloride     feeding supplement (VITAL 1.5 CAL) 35 mL/hr at 05/28/21 0024   fentaNYL infusion INTRAVENOUS Stopped (05/26/21 0747)   midazolam Stopped (05/27/21 0608)   prismasol BGK 2/2.5 dialysis solution 2,000 mL/hr at 05/28/21 0924   prismasol BGK 2/2.5 replacement solution 150 mL/hr at 05/27/21 1200   prismasol BGK 2/2.5 replacement solution 100 mL/hr at 05/28/21 0505    B-complex with vitamin C  1 tablet Per Tube Daily   chlorhexidine gluconate (MEDLINE KIT)  15 mL Mouth Rinse BID   Chlorhexidine Gluconate Cloth  6 each Topical Q0600   docusate  100 mg Per Tube BID   feeding supplement (PROSource TF)  45 mL Per Tube TID   folic acid  1 mg Per Tube Daily   free water  30 mL Per Tube Q4H   heparin injection (subcutaneous)  5,000 Units Subcutaneous Q8H   insulin aspart  2-6 Units Subcutaneous Q4H   mouth rinse  15 mL Mouth Rinse 10 times per day   pantoprazole (PROTONIX) IV  40 mg Intravenous Q12H   polyethylene glycol  17 g Per Tube BID   thiamine  100 mg Per Tube Daily    Assessment/ Plan: AKI severe: in setting of cardiac arrest, rhabdo.  Oliguric.  Hemodynamically improved - off pressors now.  Filter due for 72h change out at 4pm, hold CRRT then.  K 3.6 - d/w RN use 4K/2.5Ca fluids if needs to replace bags; just use up those hanging currently.  Net neg 50-100/hr for now. SP cardiac arrest - asystolic  arrest in setting of prob asp PNA/ ARDS in setting of EtOH Severe metabolic/ resp acidosis -   managed with RRT and vent AHRF - suspected aspiration pneumonitis w/ ARDS, on antibiotics per primary. Hepatitis - shock, EtOH, rhabdo contributors; per primary AMS - concern for anoxic brain injury, MRI at some point.   Jannifer Hick MD 05/28/2021, 12:00 PM  [facility] Pager: [phone]

## 2021-05-28 NOTE — Consult Note (Signed)
WOC Nurse Consult Note: Reason for Consult: Maroon discoloration and blistering to right anterior knee/lower leg.  S/P Intraosseous needle insertion Wound type: Trauma Pressure Injury POA: NA Measurement: 5 cm x 12 cm maroon intact discoloration with blistering to perimeter at 3 and 9 o'clock.  Lateral aspect wound with ruptured serum filled blisters.  Line of demarcation added to monitor spread. Will implement topical therapy and bedside staff should assess each shift.  Wound JXB:JYNWG filled blisters Drainage (amount, consistency, odor) moderate serous weeping  no odor.  Periwound: IO needle site noted on both anterior knees.   Dressing procedure/placement/frequency: Cleanse right knee wound with NS and pat dry. Apply Xeroform to blistering and wrap with kerlix/tape.  Change daily.  Will not follow at this time.  Please re-consult if needed.  Maple Hudson MSN, RN, FNP-BC CWON Wound, Ostomy, Continence Nurse Pager [phone]

## 2021-05-29 DIAGNOSIS — J690 Pneumonitis due to inhalation of food and vomit: Secondary | ICD-10-CM

## 2021-05-29 DIAGNOSIS — N179 Acute kidney failure, unspecified: Secondary | ICD-10-CM

## 2021-05-29 DIAGNOSIS — G9341 Metabolic encephalopathy: Secondary | ICD-10-CM

## 2021-05-29 DIAGNOSIS — K567 Ileus, unspecified: Secondary | ICD-10-CM

## 2021-05-29 DIAGNOSIS — R6521 Severe sepsis with septic shock: Secondary | ICD-10-CM

## 2021-05-29 DIAGNOSIS — A419 Sepsis, unspecified organism: Principal | ICD-10-CM

## 2021-05-29 DIAGNOSIS — J9601 Acute respiratory failure with hypoxia: Secondary | ICD-10-CM

## 2021-05-29 DIAGNOSIS — J9602 Acute respiratory failure with hypercapnia: Secondary | ICD-10-CM

## 2021-05-29 LAB — RENAL FUNCTION PANEL
Albumin: 1.9 g/dL — ABNORMAL LOW (ref 3.5–5.0)
Anion gap: 9 (ref 5–15)
BUN: 55 mg/dL — ABNORMAL HIGH (ref 6–20)
CO2: 25 mmol/L (ref 22–32)
Calcium: 8 mg/dL — ABNORMAL LOW (ref 8.9–10.3)
Chloride: 103 mmol/L (ref 98–111)
Creatinine, Ser: 4.23 mg/dL — ABNORMAL HIGH (ref 0.61–1.24)
GFR, Estimated: 17 mL/min — ABNORMAL LOW (ref >60)
Glucose, Bld: 115 mg/dL — ABNORMAL HIGH (ref 70–99)
Phosphorus: 2.3 mg/dL — ABNORMAL LOW (ref 2.5–4.6)
Potassium: 3.8 mmol/L (ref 3.5–5.1)
Sodium: 137 mmol/L (ref 135–145)

## 2021-05-29 LAB — MAGNESIUM: Magnesium: 2.9 mg/dL — ABNORMAL HIGH (ref 1.7–2.4)

## 2021-05-29 LAB — CBC
HCT: 29.3 % — ABNORMAL LOW (ref 39.0–52.0)
Hemoglobin: 9.5 g/dL — ABNORMAL LOW (ref 13.0–17.0)
MCH: 27.8 pg (ref 26.0–34.0)
MCHC: 32.4 g/dL (ref 30.0–36.0)
MCV: 85.7 fL (ref 80.0–100.0)
Platelets: 98 10*3/uL — ABNORMAL LOW (ref 150–400)
RBC: 3.42 MIL/uL — ABNORMAL LOW (ref 4.22–5.81)
RDW: 14.3 % (ref 11.5–15.5)
WBC: 13.3 10*3/uL — ABNORMAL HIGH (ref 4.0–10.5)
nRBC: 1.3 % — ABNORMAL HIGH (ref 0.0–0.2)

## 2021-05-29 LAB — GLUCOSE, CAPILLARY
Glucose-Capillary: 110 mg/dL — ABNORMAL HIGH (ref 70–99)
Glucose-Capillary: 93 mg/dL (ref 70–99)
Glucose-Capillary: 96 mg/dL (ref 70–99)
Glucose-Capillary: 89 mg/dL (ref 70–99)
Glucose-Capillary: 105 mg/dL — ABNORMAL HIGH (ref 70–99)
Glucose-Capillary: 97 mg/dL (ref 70–99)
Glucose-Capillary: 96 mg/dL (ref 70–99)
Glucose-Capillary: 114 mg/dL — ABNORMAL HIGH (ref 70–99)

## 2021-05-29 LAB — HEPATITIS B SURFACE ANTIGEN: Hepatitis B Surface Ag: NONREACTIVE

## 2021-05-29 LAB — HEPATITIS B SURFACE ANTIBODY,QUALITATIVE: Hep B S Ab: NONREACTIVE

## 2021-05-29 MED ORDER — ALTEPLASE 2 MG IJ SOLR: 2.0000 mg | Freq: Once | INTRAMUSCULAR | Status: DC | PRN

## 2021-05-29 MED ORDER — CHLORHEXIDINE GLUCONATE CLOTH 2 % EX PADS
6.0000 | MEDICATED_PAD | Freq: Every day | CUTANEOUS | Status: DC
  Administered 2021-05-31: 6 via TOPICAL

## 2021-05-29 MED ORDER — SODIUM CHLORIDE 0.9 % IV SOLN: 100.0000 mL | INTRAVENOUS | Status: DC | PRN

## 2021-05-29 MED ORDER — HEPARIN SODIUM (PORCINE) 1000 UNIT/ML DIALYSIS: 1000.0000 [IU] | INTRAMUSCULAR | Status: DC | PRN

## 2021-05-29 MED ORDER — FUROSEMIDE 10 MG/ML IJ SOLN
160.0000 mg | Freq: Once | INTRAVENOUS | Status: AC
  Administered 2021-05-29: 160 mg via INTRAVENOUS
  Filled 2021-05-29: qty 10

## 2021-05-29 MED ORDER — LIDOCAINE HCL (PF) 1 % IJ SOLN: 5.0000 mL | INTRAMUSCULAR | Status: DC | PRN

## 2021-05-29 MED ORDER — PENTAFLUOROPROP-TETRAFLUOROETH EX AERO: 1.0000 "application " | INHALATION_SPRAY | CUTANEOUS | Status: DC | PRN

## 2021-05-29 MED ORDER — LIDOCAINE-PRILOCAINE 2.5-2.5 % EX CREA
1.0000 "application " | TOPICAL_CREAM | CUTANEOUS | Status: DC | PRN
  Filled 2021-05-29: qty 5

## 2021-05-29 MED ORDER — BISACODYL 10 MG RE SUPP
10.0000 mg | Freq: Once | RECTAL | Status: AC
  Administered 2021-05-29: 10 mg via RECTAL
  Filled 2021-05-29: qty 1

## 2021-05-29 MED ORDER — ENSURE ENLIVE PO LIQD
237.0000 mL | Freq: Two times a day (BID) | ORAL | Status: DC
  Administered 2021-05-30 – 2021-05-31 (×3): 237 mL via ORAL

## 2021-05-29 NOTE — Evaluation (Signed)
Occupational Therapy Evaluation Patient Details Name: Andres Braun MRN: [id number] DOB: [birth year] Today's Date: 05/29/2021   History of Present Illness 46 y.o. man admitted after cardiac arrest at home. Pt with agonal he breathing, lost pulse, 20 minutes of CPR, 5 rounds of epinephrine,acute respiratory failure with hypoxia and hypercapnia, septic shock due to undetermined organism, AKI,   acute metabolic encephalopathy,  aspiration pneumonia of both lower lobes due to vomit.   Clinical Impression   This 46 yo male admitted with above presents to acute OT with PLOF of what is believed to be totally independent pta but his responses to questions (with in person interpreter) were not consistent.He currently is total A+2-Max A +2 for all mobility and Max-total A for all basic ADLs. He will continue to benefit from acute OT with follow up on AIR recommended.     Recommendations for follow up therapy are one component of a multi-disciplinary discharge planning process, led by the attending physician.  Recommendations may be updated based on patient status, additional functional criteria and insurance authorization.   Follow Up Recommendations  Acute inpatient rehab (3hours/day)    Assistance Recommended at Discharge Frequent or constant Supervision/Assistance  Patient can return home with the following  (total care with +2 at times)    Functional Status Assessment  Patient has had a recent decline in their functional status and demonstrates the ability to make significant improvements in function in a reasonable and predictable amount of time.  Equipment Recommendations  Other (comment) (TBD next venue)       Precautions / Restrictions Precautions Precautions: Fall Restrictions Weight Bearing Restrictions: No      Mobility Bed Mobility Overal bed mobility: Needs Assistance Bed Mobility: Supine to Sit     Supine to sit: Total assist, +2 for physical assistance     General bed  mobility comments: HOB ~20 degrees    Transfers Overall transfer level: Needs assistance   Transfers: Sit to/from Stand Sit to Stand: Max assist, +2 physical assistance           General transfer comment: left knee buckling (stood x1 with increased time to achieve full upright posture), 2nd attempt to stand left knee buckled, 3rd attempt to stand only ~1/4 way up      Balance Overall balance assessment: Needs assistance Sitting-balance support: Single extremity supported, Feet supported Sitting balance-Leahy Scale: Poor Sitting balance - Comments: posterior lean intermittently   Standing balance support: Bilateral upper extremity supported Standing balance-Leahy Scale: Zero Standing balance comment: left knee buckles, must be blocked for sit<>stand                           ADL either performed or assessed with clinical judgement   ADL Overall ADL's : Needs assistance/impaired Eating/Feeding: NPO   Grooming: Maximal assistance;Sitting Grooming Details (indicate cue type and reason): EOB Upper Body Bathing: Maximal assistance;Sitting Upper Body Bathing Details (indicate cue type and reason): EOB Lower Body Bathing: Total assistance Lower Body Bathing Details (indicate cue type and reason): Max A +2 sit<>stand Upper Body Dressing : Total assistance;Sitting Upper Body Dressing Details (indicate cue type and reason): EOB Lower Body Dressing: Total assistance Lower Body Dressing Details (indicate cue type and reason): Max A +2 sit<>stand                     Vision   Additional Comments: continue to assess  Pertinent Vitals/Pain Pain Assessment Pain Assessment: CPOT Facial Expression: Relaxed, neutral Body Movements: Restlessness Muscle Tension: Relaxed Compliance with ventilator (intubated pts.): N/A Vocalization (extubated pts.): Talking in normal tone or no sound CPOT Total: 2     Hand Dominance  right   Extremity/Trunk  Assessment Upper Extremity Assessment Upper Extremity Assessment: RUE deficits/detail;LUE deficits/detail RUE Deficits / Details: edematous, slow to move, decreased coordination, AROM diminshed throughout RUE Coordination: decreased fine motor;decreased gross motor LUE Deficits / Details: moving more normally compared to RUE, no edema           Communication Communication Communication: Prefers language other than English Charity fundraiser interpreting)   Cognition Arousal/Alertness: Awake/alert Behavior During Therapy: Flat affect Overall Cognitive Status: Impaired/Different from baseline Area of Impairment: Orientation, Attention, Memory, Following commands, Safety/judgement, Awareness, Problem solving                 Orientation Level: Disoriented to, Time Current Attention Level: Focused Memory: Decreased short-term memory Following Commands: Follows one step commands inconsistently, Follows one step commands with increased time Safety/Judgement: Decreased awareness of safety, Decreased awareness of deficits Awareness: Intellectual Problem Solving: Slow processing, Decreased initiation, Difficulty sequencing, Requires verbal cues, Requires tactile cues General Comments: perservating on wanting water                Home Living Family/patient expects to be discharged to:: Private residence Living Arrangements: Non-relatives/Friends Available Help at Discharge: Friend(s);Available PRN/intermittently Type of Home: House Home Access: Stairs to enter Entrance Stairs-Number of Steps: 3   Home Layout: One level                          Prior Functioning/Environment Prior Level of Function : Independent/Modified Independent;Working/employed;Driving                        OT Problem List: Decreased strength;Decreased range of motion;Decreased activity tolerance;Impaired balance (sitting and/or standing);Decreased coordination;Decreased cognition;Decreased  safety awareness;Decreased knowledge of use of DME or AE;Impaired UE functional use;Increased edema      OT Treatment/Interventions: Self-care/ADL training;DME and/or AE instruction;Patient/family education;Balance training    OT Goals(Current goals can be found in the care plan section) Acute Rehab OT Goals Patient Stated Goal: to get some water OT Goal Formulation: Patient unable to participate in goal setting Time For Goal Achievement: 06/12/21 Potential to Achieve Goals: Good  OT Frequency: Min 2X/week       AM-PAC OT "6 Clicks" Daily Activity     Outcome Measure Help from another person eating meals?: Total Help from another person taking care of personal grooming?: A Lot Help from another person toileting, which includes using toliet, bedpan, or urinal?: Total Help from another person bathing (including washing, rinsing, drying)?: A Lot Help from another person to put on and taking off regular upper body clothing?: Total Help from another person to put on and taking off regular lower body clothing?: Total 6 Click Score: 8   End of Session Equipment Utilized During Treatment: Gait belt Nurse Communication: Mobility status  Activity Tolerance: Patient limited by fatigue (perservating on wanting water, every time we asked him to do something he asked for water) Patient left: in bed;with call bell/phone within reach;with bed alarm set  OT Visit Diagnosis: Unsteadiness on feet (R26.81);Other abnormalities of gait and mobility (R26.89);Muscle weakness (generalized) (M62.81);Other symptoms and signs involving cognitive function                Time: 1050-1109  OT Time Calculation (min): 19 min Charges:  OT General Charges $OT Visit: 1 Visit OT Evaluation $OT Eval Moderate Complexity: 1 Mod  Ignacia Palmaathy Thedford Bunton, OTR/L [facility] Pager [phone] Office [phone]    Evette GeorgesLeonard, Riah Kehoe Eva 05/29/2021, 12:45 PM

## 2021-05-29 NOTE — Progress Notes (Signed)
Patient son called to get update on patient for wife. Wife, Adria Devon does not speak english and asked sone, Josue Taytum Lail to speak as her proxy to translate for her. Son is 46 years old. This RN was able to ask patient if he had a son and the namen of the son, and patient (oriented to person) did give the same name provided by the caller. Wife set up a Code word ALEX for patient. Wife updated.

## 2021-05-29 NOTE — Progress Notes (Signed)
Nutrition Follow-up  DOCUMENTATION CODES:   Not applicable  INTERVENTION:   Ensure Enlive po BID, each supplement provides 350 kcal and 20 grams of protein  Liberalize diet to REGULAR  Continue B complex with Thiamine and folic acid for now   NUTRITION DIAGNOSIS:   Inadequate oral intake related to acute illness as evidenced by NPO status.  Being addressed via supplements, diet advancement  GOAL:   Patient will meet greater than or equal to 90% of their needs  Progressing  MONITOR:   Vent status, TF tolerance, Weight trends, Labs  REASON FOR ASSESSMENT:   Ventilator, Consult Enteral/tube feeding initiation and management  ASSESSMENT:   46 yo male admitted post OOH cardiac arrest, acute respiratory failure post arrest, possible aspiration-intubated, AKI requiring CRRT. No PMH on file. Pt is spanish speaking  1/15 Admitted Intubated, CRRT initiated 1/16 TTM initiated 1/19 Extubated, CRRT held  Extubated yesterday CRRT on hold, plan for iHD tomorrow  Cortrak coiled in patient mouth overnight and was removed.   SLP evaluated today and diet advanced to Heart Healthy. Cortrak ordered for replacement but on hold given diet advancement.    Labs: phosphorus 2.3 (L) Meds: B complex with C, lasix gtt, folic acid, ss novolog, miralax thiamine   Diet Order:   Diet Order             Diet Heart Room service appropriate? Yes; Fluid consistency: Thin  Diet effective now                   EDUCATION NEEDS:   Not appropriate for education at this time  Skin:  Skin Assessment: Reviewed RN Assessment  Last BM:  1/19  Height:   Ht Readings from Last 1 Encounters:  05/24/21 6' (1.829 m)    Weight:   Wt Readings from Last 1 Encounters:  05/29/21 86.6 kg    BMI:  Body mass index is 25.89 kg/m.  Estimated Nutritional Needs:   Kcal:  2100-2300 kcals  Protein:  120-140 g  Fluid:  >/= 2 L   Romelle Starcher MS, RDN, LDN, CNSC Registered Dietitian  III Clinical Nutrition RD Pager and On-Call Pager Number Located in [place]

## 2021-05-29 NOTE — Procedures (Signed)
Objective Swallowing Evaluation: Type of Study: MBS-Modified Barium Swallow Study   Patient Details  Name: Andres Braun MRN: [id number] Date of Birth: 07/09/1975  Today's Date: 05/29/2021 Time: SLP Start Time (ACUTE ONLY): 1241 -SLP Stop Time (ACUTE ONLY): 1257  SLP Time Calculation (min) (ACUTE ONLY): 16 min   Past Medical History: History reviewed. No pertinent past medical history. Past Surgical History: History reviewed. No pertinent surgical history. HPI: 46 year old man admitted after cardiac arrest at home. PEA arrest, s/p CPR x 20 minutes and Epi x 5. Found by friends with agonal breathing.  Lost pulse. ETT 1/15-19. Remains encephalopathic. Head CT 1/15 with no acute findings.  CXT 1/19: "New patchy right lower lung peribronchial opacity, favor atelectasis"   No data recorded   Recommendations for follow up therapy are one component of a multi-disciplinary discharge planning process, led by the attending physician.  Recommendations may be updated based on patient status, additional functional criteria and insurance authorization.  Assessment / Plan / Recommendation  Clinical Impressions 05/29/2021  Clinical Impression Pt presents with functional swallowing.  There was no penetration or aspiration with any consistencies trialed, including serial straw sips of thin liquid dairy.  On scope passage larynx was edematous with white excressence on interior surface of epiglottis.  This is suspected to be 2/2 intubation. Pt achieved adequate VR adduction.  There was delayed swallow intiation x1 with serial straw sips of milk, with swallow initiation at the vallecula.  There was trace valecula residue with regular solid cracker which was decreased with subsequent swallows.    Recommend regular texture diet with thin liquids.  SLP to follow for diet tolerance.  SLP Visit Diagnosis Dysphagia, unspecified (R13.10)  Attention and concentration deficit following --  Frontal lobe and executive  function deficit following --  Impact on safety and function No limitations      Treatment Recommendations 05/29/2021  Treatment Recommendations Therapy as outlined in treatment plan below     Prognosis 05/29/2021  Prognosis for Safe Diet Advancement (No Data)  Barriers to Reach Goals --  Barriers/Prognosis Comment --    Diet Recommendations 05/29/2021  SLP Diet Recommendations Regular solids;Thin liquid  Liquid Administration via Cup;Straw  Medication Administration Whole meds with liquid  Compensations Slow rate;Small sips/bites  Postural Changes Seated upright at 90 degrees      Other Recommendations 05/29/2021  Recommended Consults --  Oral Care Recommendations Oral care BID  Other Recommendations --  Follow Up Recommendations No SLP follow up  Assistance recommended at discharge None  Functional Status Assessment Patient has not had a recent decline in their functional status    Frequency and Duration  05/29/2021  Speech Therapy Frequency (ACUTE ONLY) min 1 x/week  Treatment Duration 1 week      Oral Phase 05/29/2021  Oral Phase WFL  Oral - Pudding Teaspoon --  Oral - Pudding Cup --  Oral - Honey Teaspoon --  Oral - Honey Cup --  Oral - Nectar Teaspoon --  Oral - Nectar Cup --  Oral - Nectar Straw --  Oral - Thin Teaspoon --  Oral - Thin Cup --  Oral - Thin Straw --  Oral - Puree --  Oral - Mech Soft --  Oral - Regular --  Oral - Multi-Consistency --  Oral - Pill --  Oral Phase - Comment --    Pharyngeal Phase 05/29/2021  Pharyngeal Phase Impaired  Pharyngeal- Pudding Teaspoon --  Pharyngeal --  Pharyngeal- Pudding Cup --  Pharyngeal --  Pharyngeal-  Honey Teaspoon --  Pharyngeal --  Pharyngeal- Honey Cup --  Pharyngeal --  Pharyngeal- Nectar Teaspoon --  Pharyngeal --  Pharyngeal- Nectar Cup --  Pharyngeal --  Pharyngeal- Nectar Straw --  Pharyngeal --  Pharyngeal- Thin Teaspoon --  Pharyngeal --  Pharyngeal- Thin Cup [facility]  Pharyngeal Material  does not enter airway  Pharyngeal- Thin Straw Delayed swallow initiation-vallecula  Pharyngeal Material does not enter airway  Pharyngeal- Puree WFL  Pharyngeal Material does not enter airway  Pharyngeal- Mechanical Soft --  Pharyngeal --  Pharyngeal- Regular Pharyngeal residue - valleculae  Pharyngeal Material does not enter airway  Pharyngeal- Multi-consistency --  Pharyngeal --  Pharyngeal- Pill --  Pharyngeal --  Pharyngeal Comment --     No flowsheet data found.   Kerrie Pleasure, MA, CCC-SLP Acute Rehabilitation Services Office: [phone]  05/29/2021, 1:21 PM

## 2021-05-29 NOTE — Evaluation (Signed)
Physical Therapy Evaluation Patient Details Name: Andres Braun MRN: [id number] DOB: 09/27/1975 Today's Date: 05/29/2021  History of Present Illness  46 y.o. man admitted 1/15 after cardiac arrest at home. Pt with agonal he breathing, lost pulse, 20 minutes of CPR, 5 rounds of epinephrine,acute respiratory failure with hypoxia and hypercapnia, septic shock due to undetermined organism, AKI,   acute metabolic encephalopathy,  aspiration pneumonia of both lower lobes due to vomit. PMH - unknown  Clinical Impression  Pt presents to PT with decreased mobility due to decreased strength, decreased balance, and decreased cognition.Pt with no active movement in lt knee or ankle making any mobility difficult. Recommend acute inpatient rehab for further therapy to maximize independence and function.        Recommendations for follow up therapy are one component of a multi-disciplinary discharge planning process, led by the attending physician.  Recommendations may be updated based on patient status, additional functional criteria and insurance authorization.  Follow Up Recommendations Acute inpatient rehab (3hours/day)    Assistance Recommended at Discharge Frequent or constant Supervision/Assistance  Patient can return home with the following  Two people to help with walking and/or transfers;Help with stairs or ramp for entrance    Equipment Recommendations Wheelchair (measurements PT);Wheelchair cushion (measurements PT)  Recommendations for Other Services       Functional Status Assessment Patient has had a recent decline in their functional status and demonstrates the ability to make significant improvements in function in a reasonable and predictable amount of time.     Precautions / Restrictions Precautions Precautions: Fall Restrictions Weight Bearing Restrictions: No      Mobility  Bed Mobility Overal bed mobility: Needs Assistance Bed Mobility: Supine to Sit, Sit to Supine      Supine to sit: Total assist, +2 for physical assistance, HOB elevated Sit to supine: +2 for physical assistance, Min assist   General bed mobility comments: Assist for all aspects coming to sitting. Assist to bring left leg up into bed and to control descent of trunk    Transfers Overall transfer level: Needs assistance Equipment used: 2 person hand held assist Transfers: Sit to/from Stand Sit to Stand: Max assist, +2 physical assistance           General transfer comment: Assist to bring hips up and to block left knee. Left knee buckling (stood x1 with increased time to achieve full upright posture), 2nd attempt to stand left knee buckled, 3rd attempt to stand only ~1/4 way up with pt coming forward off of bed and not up    Ambulation/Gait                  Stairs            Wheelchair Mobility    Modified Rankin (Stroke Patients Only)       Balance Overall balance assessment: Needs assistance Sitting-balance support: Single extremity supported, Feet supported Sitting balance-Leahy Scale: Poor Sitting balance - Comments: UE support and intermittent posterior lean Postural control: Posterior lean Standing balance support: Bilateral upper extremity supported Standing balance-Leahy Scale: Zero Standing balance comment: +2 mod to max assist to maintain standing with lt knee blocked                             Pertinent Vitals/Pain Pain Assessment Pain Assessment: CPOT Facial Expression: Relaxed, neutral Body Movements: Restlessness Muscle Tension: Relaxed Compliance with ventilator (intubated pts.): N/A Vocalization (extubated pts.): Talking in normal  tone or no sound CPOT Total: 2    Home Living Family/patient expects to be discharged to:: Private residence Living Arrangements: Non-relatives/Friends Available Help at Discharge: Friend(s);Available PRN/intermittently Type of Home: House Home Access: Stairs to enter   Entrance  Stairs-Number of Steps: 3   Home Layout: One level        Prior Function Prior Level of Function : Independent/Modified Independent;Working/employed;Driving                     Hand Dominance        Extremity/Trunk Assessment   Upper Extremity Assessment Upper Extremity Assessment: Defer to OT evaluation RUE Deficits / Details: edematous, slow to move, decreased coordination, AROM diminshed throughout RUE Coordination: decreased fine motor;decreased gross motor LUE Deficits / Details: moving more normally compared to RUE, no edema    Lower Extremity Assessment Lower Extremity Assessment: LLE deficits/detail;Generalized weakness LLE Deficits / Details: Unable to detect in active movement or muscle contraction on knee and ankle. Some active hip movement noted but <2+/5 LLE Sensation: decreased light touch       Communication   Communication: Prefers language other than English Sales executive in person interpreter used)  Cognition Arousal/Alertness: Awake/alert Behavior During Therapy: Flat affect Overall Cognitive Status: Impaired/Different from baseline Area of Impairment: Orientation, Attention, Memory, Following commands, Safety/judgement, Awareness, Problem solving                 Orientation Level: Disoriented to, Time Current Attention Level: Focused Memory: Decreased short-term memory Following Commands: Follows one step commands inconsistently, Follows one step commands with increased time Safety/Judgement: Decreased awareness of safety, Decreased awareness of deficits Awareness: Intellectual Problem Solving: Slow processing, Decreased initiation, Difficulty sequencing, Requires verbal cues, Requires tactile cues General Comments: perservating on wanting water        General Comments      Exercises     Assessment/Plan    PT Assessment Patient needs continued PT services  PT Problem List Decreased strength;Decreased balance;Decreased  cognition;Decreased mobility;Decreased knowledge of use of DME;Decreased activity tolerance;Decreased safety awareness       PT Treatment Interventions DME instruction;Functional mobility training;Balance training;Patient/family education;Gait training;Therapeutic activities;Neuromuscular re-education;Wheelchair mobility training;Therapeutic exercise    PT Goals (Current goals can be found in the Care Plan section)  Acute Rehab PT Goals Patient Stated Goal: unable to state PT Goal Formulation: Patient unable to participate in goal setting Time For Goal Achievement: 06/12/21 Potential to Achieve Goals: Fair    Frequency Min 3X/week     Co-evaluation               AM-PAC PT "6 Clicks" Mobility  Outcome Measure Help needed turning from your back to your side while in a flat bed without using bedrails?: A Lot Help needed moving from lying on your back to sitting on the side of a flat bed without using bedrails?: Total Help needed moving to and from a bed to a chair (including a wheelchair)?: Total Help needed standing up from a chair using your arms (e.g., wheelchair or bedside chair)?: Total Help needed to walk in hospital room?: Total Help needed climbing 3-5 steps with a railing? : Total 6 Click Score: 7    End of Session Equipment Utilized During Treatment: Gait belt;Oxygen Activity Tolerance: Patient tolerated treatment well Patient left: in bed;with call bell/phone within reach;with bed alarm set Nurse Communication: Mobility status PT Visit Diagnosis: Other abnormalities of gait and mobility (R26.89);Muscle weakness (generalized) (M62.81);Difficulty in walking, not elsewhere classified (R26.2)  Time: 1052-1110 PT Time Calculation (min) (ACUTE ONLY): 18 min   Charges:   PT Evaluation $PT Eval Moderate Complexity: 1 Mod          [facility] PT Acute Rehabilitation Services Pager [phone] Office [phone]   Angelina Ok [facility] 05/29/2021, 2:00 PM

## 2021-05-29 NOTE — Progress Notes (Signed)
? ?  Inpatient Rehab Admissions Coordinator : ? ?Per therapy recommendations, patient was screened for CIR candidacy by Laelyn Blumenthal RN MSN.  At this time patient appears to be a potential candidate for CIR. I will place a rehab consult per protocol for full assessment. Please call me with any questions. ? ?Dshawn Mcnay RN MSN ?Admissions Coordinator ?[phone] ?  ?

## 2021-05-29 NOTE — Progress Notes (Signed)
[facility] Progress Note   Subjective:   I/Os yesterday 630 / 2.1, UOP 42m. Now extubated.  Objective Vitals:   05/29/21 0600 05/29/21 0700 05/29/21 0754 05/29/21 0800  BP: (!) 166/91 (!) 148/87  131/89  Pulse: (!) 102 (!) 116  (!) 107  Resp: (!) 25 20  (!) 25  Temp:   98.5 F (36.9 C)   TempSrc:   Oral   SpO2: 98% 99%  97%  Weight:      Height:       Physical Exam General: awake and alert Heart: RRR, no rub Lungs: coarse BL Abdomen: soft, mildly distended Extremities: trace edema, feet less mottled, calves warm, R knee with developing bullous lesion Neuro:  awake and alert, oriented to self and [place], cannot wiggle L toes or dorsiflex, R can move toes Dialysis Access:  RIJ fem HD catheter   Additional Objective Labs: Basic Metabolic Panel: Recent Labs  Lab 05/28/21 0347 05/28/21 1615 05/29/21 0253  NA 136 137 137  K 3.6 3.7 3.8  CL 102 97* 103  CO2 26 24 25   GLUCOSE 130* 131* 115*  BUN 33* 36* 55*  CREATININE 2.75* 2.71* 4.23*  CALCIUM 7.7* 8.4* 8.0*  PHOS 2.9 2.6 2.3*    Liver Function Tests: Recent Labs  Lab 05/24/21 1406 05/25/21 0416 05/26/21 0916 05/26/21 1600 05/28/21 0347 05/28/21 1615 05/29/21 0253  AST 2,052*  --  3,852*  --  1,193*  --   --   ALT 1,832*  --  1,756*  --  896*  --   --   ALKPHOS 103  --  86  --  110  --   --   BILITOT 0.4  --  0.7  --  0.6  --   --   PROT 4.4*  --  5.0*  --  5.6*  --   --   ALBUMIN 2.4*   < > 2.2*   < > 2.0* 1.9* 1.9*   < > = values in this interval not displayed.    No results for input(s): LIPASE, AMYLASE in the last 168 hours. CBC: Recent Labs  Lab 05/24/21 0916 05/24/21 0931 05/27/21 0330 05/27/21 0931 05/27/21 1535 05/28/21 0347 05/29/21 0253  WBC 9.6   < > 14.4* 14.9* 13.2* 11.9* 13.3*  NEUTROABS 5.2  --   --   --   --   --   --   HGB 15.9   < > 11.3* 10.5* 10.5* 10.5* 9.5*  HCT 53.1*   < > 34.8* 33.3* 32.0* 31.1* 29.3*  MCV 95.5   < > 86.6 87.4 87.0 85.9 85.7  PLT 256    < > 101* 103* 95* 93* 98*   < > = values in this interval not displayed.    Blood Culture No results found for: SDES, SPECREQUEST, CULT, REPTSTATUS  Cardiac Enzymes: Recent Labs  Lab 05/24/21 0932 05/25/21 0416  CKTOTAL 14,071* >50,000*    CBG: Recent Labs  Lab 05/28/21 2004 05/28/21 2342 05/29/21 0253 05/29/21 0423 05/29/21 0755  GLUCAP 87 101* 110* 93 96    Iron Studies: No results for input(s): IRON, TIBC, TRANSFERRIN, FERRITIN in the last 72 hours. @lablastinr3 @ Studies/Results: DG Abd 1 View  Result Date: 05/28/2021 CLINICAL DATA:  Ileus. History of endotracheal tube. Cardiac arrest. EXAM: ABDOMEN - 1 VIEW COMPARISON:  Abdominal radiograph 05/27/2021 FINDINGS: A weighted tip endotracheal tube again extends through the stomach and likely into the proximal duodenum with the tip overlying the junction at the  duodenal jejunal junction. There again is likely an additional nasogastric tube with tip within the more proximal stomach. There is again contrast material seen overlying the proximal stomach similar to prior. Right femoral approach central venous catheter tip again overlies the right L5 vertebral body. No dilated loops of bowel identified. IMPRESSION: Feeding tube with tip at the duodenal jejunal junction. Partial visualization of nasogastric tube with tip overlying the proximal stomach. Electronically Signed   By: Yvonne Kendall M.D.   On: 05/28/2021 11:51   DG Chest Port 1 View  Result Date: 05/28/2021 CLINICAL DATA:  46 year old male status post cardiac arrest. EXAM: PORTABLE CHEST 1 VIEW COMPARISON:  Portable chest 05/27/2021 and earlier. FINDINGS: Portable AP semi upright view at 0512 hours. Stable lines and tubes. Regressed but not resolved pneumomediastinum since yesterday, although subcutaneous gas now visible in the left supraclavicular fossa. Questionable left chest wall gas also now. But no pneumothorax identified. Mediastinal contours remain normal. Stable lung  volumes. Mild patchy peribronchial opacity in the right lower lung is new. No pulmonary edema or other confluent opacity. Stable small volume contrast at the gastric fundus. Negative visible bowel gas. Stable visualized osseous structures. IMPRESSION: 1. Stable lines and tubes. 2. Regressed but not resolved pneumomediastinum since yesterday, and subcutaneous gas now visible in the left supraclavicular fossa - likely tracking from the chest. Continue to suspect this is incidental, such as barotrauma related. And no pneumothorax is identified. 3. New patchy right lower lung peribronchial opacity, favor atelectasis. Electronically Signed   By: Genevie Ann M.D.   On: 05/28/2021 08:57   Medications:   prismasol BGK 4/2.5 Stopped (05/28/21 1627)    prismasol BGK 4/2.5 Stopped (05/28/21 1627)   sodium chloride 10 mL/hr at 05/29/21 0400   sodium chloride     feeding supplement (VITAL 1.5 CAL) 55 mL/hr at 05/28/21 1500   prismasol BGK 4/2.5 2,000 mL/hr at 05/28/21 1504    B-complex with vitamin C  1 tablet Per Tube Daily   chlorhexidine gluconate (MEDLINE KIT)  15 mL Mouth Rinse BID   Chlorhexidine Gluconate Cloth  6 each Topical Q0600   docusate  100 mg Per Tube BID   feeding supplement (PROSource TF)  45 mL Per Tube TID   folic acid  1 mg Per Tube Daily   free water  30 mL Per Tube Q4H   heparin injection (subcutaneous)  5,000 Units Subcutaneous Q8H   insulin aspart  2-6 Units Subcutaneous Q4H   mouth rinse  15 mL Mouth Rinse 10 times per day   pantoprazole (PROTONIX) IV  40 mg Intravenous Q12H   polyethylene glycol  17 g Per Tube BID   thiamine  100 mg Per Tube Daily    Assessment/ Plan: AKI severe: in setting of cardiac arrest, rhabdo.  Oliguric.  Required CRRT 1/15 - 1/19.  Hemodynamically improved so holding CRRT and plan for iHD - based on UOP and labs plan is tomorrow.  Cont supportive care.  Avoid hypotension, nephrotoxins.  SP cardiac arrest - asystolic arrest in setting of prob asp PNA/ ARDS  in setting of EtOH Severe metabolic/ resp acidosis -   improved with vent and CRRT.   AHRF - suspected aspiration pneumonitis w/ ARDS, on antibiotics per primary.  No on [place] Hepatitis - shock, EtOH, rhabdo contributors; per primary AMS - improving.    Jannifer Hick MD 05/29/2021, 11:15 AM  [facility] Pager: [phone]

## 2021-05-29 NOTE — Progress Notes (Signed)
NAME:  Andres Braun, MRN:  [id number], DOB:  03-01-1976, LOS: 5 ADMISSION DATE:  05/24/2021, CONSULTATION DATE:  05/29/21 REFERRING MD:  EDP, CHIEF COMPLAINT:  Cardiac arrest   History of Present Illness:  46 year old man admitted after cardiac arrest at home. PEA arrest, s/p CPR x 20 minutes and Epi x 5. Patient reportedly out drinking with his friends the night prior to admission.  Found by friends with agonal breathing.  Lost pulse. Improved neurologic status, extubated 1/19. Remains encephalopathic.  Pertinent Medical History:  Unknown  Significant Hospital Events: Including procedures, antibiotic start and stop dates in addition to other pertinent events   1/15:  20 minutes of CPR, 5 rounds of epinephrine.  ROSC was obtained.  Rhythm PEA per report.  Emesis noted all around him. On arrival to ED, severely acidemic pH 6.7.  Refractory acidemia.  Intubated. Difficult to ventilate.  Eventually requiring dose of paralytic after adequate sedation.  Labs notable for renal failure, elevated LFTs.  CK in the 14,000 range. Started on ceftriaxone for aspiration PNA, Nephro consulted. Started on CRRT.  CVL and HD cath placed.  1/16: hypotensive overnight. Having Anoxic myoclonus during evening hrs. Treated w/ Versed. Having difficulty w/ HD cath clotting due to Curved ports affecting flow.  EEG suggested diffuse cerebral dysfunction.  There is no epileptiform activity or seizures.  Right IJ dialysis catheter removed due to malfunction, right femoral dialysis catheter placed.  Social work working on family contacts.  Temperature management initiated to avoid fever 1/17: Hemodynamics a little better.  Sedation minimized.  Exam a little improved compared to 1/16 spontaneously moving left upper extremity to stimulus, more forceful cough, opens eyes.  Lower extremity pulses no longer palpable or audible via Doppler. IV heparin stared. Dopplers ordered  1/18: Hemodynamics continue to improve, decreasing  pressor requirements. Improving neuro exam, will weakly follow commands when asked in Spanish. Readily opens eyes to voice. BLE pedal pulses now +doppler. Cortrak placed. 1/19: Improved residuals s/p Cortrak, though emesis on placement. Emesis overnight. Minimal secretions. Mild agitation/gagging, waking up/intermittently following commands. Off of pressors. Extubated to [place]. 1/20: Awake, stable respiratory status on 4L [place]. Remains encephalopathic, oriented only to self/general location. Hypertensive, off of pressors. CRRT stopped, plan for HD.  Interim History / Subjective:  Requesting water Awake, but remains confused/encephalopathic Can state name and understands he is in [place] the year is 2013 Stable on 4L [place] Off of CRRT SLP/PT/OT today  Objective:  Blood pressure (!) 148/87, pulse (!) 116, temperature 99 F (37.2 C), temperature source Axillary, resp. rate 20, height 6' (1.829 m), weight 86.6 kg, SpO2 99 %. CVP:  [1 mmHg-29 mmHg] 5 mmHg  Vent Mode: CPAP;PSV FiO2 (%):  [40 %] 40 % Set Rate:  [32 bmp] 32 bmp Vt Set:  [540 mL] 540 mL PEEP:  [5 cmH20] 5 cmH20 Pressure Support:  [5 cmH20] 5 cmH20 Plateau Pressure:  [20 cmH20] 20 cmH20   Intake/Output Summary (Last 24 hours) at 05/29/2021 0756 Last data filed at 05/29/2021 0400 Gross per 24 hour  Intake 634.36 ml  Output 2116 ml  Net -1481.64 ml    Filed Weights   05/27/21 0330 05/28/21 0459 05/29/21 0341  Weight: 93.5 kg 89.9 kg 86.6 kg   Physical Examination: General: Acutely ill-appearing middle-aged man in NAD. HEENT: [place]/AT, anicteric sclera, PERRL, dry mucous membranes. Neuro: Awake, oriented x 1-2. Drowsy. Responds to verbal stimuli. Following commands consistently. Moves all 4 extremities spontaneously. CV: Tachycardic, regular rhythm, no m/g/r. PULM:  Breathing even and unlabored on 4LNC. Lung fields CTAB in upper fields, diminished at bases. GI: Soft, nontender, mildly distended. Hypoactive bowel  sounds. Extremities: Bilateral symmetric 1+ LE edema noted. Skin: Warm/dry, no rashes. R anterior shin with ecchymosis/blister 2/2 prior IO.  Resolved Hospital Problem List:   Cold lower extremities with no palpable pulses, improved Suspect at least to some extent due to shock state. Improved 1/18 with bilateral DP/PT pulses present with doppler. Mixed metabolic and respiratory acidosis, improving Acid-base improving with CRRT.  Assessment & Plan:  Cardiac arrest Reported PEA arrest. 20 minutes of CPR, 5 doses of epi.  Emesis noted all over patient.  Suspect respiratory event, pneumonia, aspiration likely. - Continue telemetry - Supportive care  Circulatory shock, improving Favor mix of septic shock from aspiration and post-arrest vasoplegia. His ejection fraction is 55 to 60% with normal regional wall function. CVP 28 on admission. - Off of all pressors since 1/18PM - Mildly hypertensive to SBP 140s-160s - CVP 10, plan to remove CVC today - S/p CRRT with 2.1L removed - Expect transition to iHD with improved BP  Acute Hypoxemic Respiratory Failure Due to aspiration pneumonia and subsequent cardiac arrest. - Continue supplemental O2 support, tolerating [place] s/p extubation - Wean O2 for sat > 90% - Pulmonary hygiene - Follow CXR - S/p Ceftriaxone x 5 days (end 1/19) - Consider diuresis, pending HD plan  Acute metabolic encephalopathy favoring postarrest anoxic encephalopathy. Initial CT brain negative, downtime in excess of 20 minutes, EEG suggesting fairly significant neurological dysfunction however exam today a little improved however overall still concerned about functional recovery - Minimize sedating medications as able - Communication with patient should be in Spanish (with interpreter) if at all possible  Gastric ileus with high gastric output Cortrak placed 1/18, self-discontinued 1/19. Improved residuals with post-pyloric tube location. - Cortrak replacement 1/20 -  Escalate bowel regimen - No Reglan in the setting of prolonged Qtc - SLP evaluation  Acute renal failure with oliguria Rhabdomyolysis - Nephrology following, appreciate assistance - Off of CRRT as of 1/19PM - Hemodynamics improved, would likely tolerate iHD - Trend BMP - Replete electrolytes as indicated - Monitor I&Os, bladder scans Q shift to monitor for renal recovery - Avoid nephrotoxic agents as able - Ensure adequate renal perfusion  Bloody OG output: Possibly traumatic. - PPI BID  Elevated liver enzymes:  T bili not Phos normal.  Suspect due to shock.  Likely related to rhabdomyolysis.   - Trend LFTs to normal, improving  R anterior knee wound - Rt prior IO placement - Appreciate WOC recs  Physical deconditioning - PT/OT/SLP as appropriate - Limited at present in the setting of femoral HD catheter  Best Practice (right click and "Reselect all SmartList Selections" daily)   Diet/type: tubefeeds via CorTrak, SLP eval pending DVT prophylaxis: systemic heparin GI prophylaxis: PPI Lines: Central line, Dialysis Catheter, and Arterial Line Foley:  Yes, and it is still needed Code Status:  full code Last date of multidisciplinary goals of care discussion: N/A. We have a contact number of a spouse - apparently separated from this woman; however, also has a cousin.  None of which who speak [place].  We will continue to try to reach out via interpreter.  Critical care time: 6 minutes   Lestine Mount, [facility] 05/29/21 7:56 AM  Please see [email or web address] for pager details.  From 7A-7P if no response, please call [phone] After hours, please call ELink [phone]

## 2021-05-29 NOTE — Progress Notes (Signed)
Notified Elink that Cortrak was coiling into the pt's mouth. Removed it and stop the TF.

## 2021-05-29 NOTE — Evaluation (Signed)
Clinical/Bedside Swallow Evaluation Patient Details  Name: Andres Braun MRN: [id number] Date of Birth: 04-20-1976  Today's Date: 05/29/2021 Time: SLP Start Time (ACUTE ONLY): 1158 SLP Stop Time (ACUTE ONLY): 1219 SLP Time Calculation (min) (ACUTE ONLY): 21 min  Past Medical History: History reviewed. No pertinent past medical history. Past Surgical History: History reviewed. No pertinent surgical history. HPI:  46 year old man admitted after cardiac arrest at home. PEA arrest, s/p CPR x 20 minutes and Epi x 5. Found by friends with agonal breathing.  Lost pulse. ETT 1/15-19. Remains encephalopathic. Head CT 1/15 with no acute findings.  CXT 1/19: "New patchy right lower lung peribronchial opacity, favor atelectasis"    Assessment / Plan / Recommendation  Clinical Impression  Pt presents with clinical indicators of pharyngeal dysphagia following 4 day intubation.  There was intermittenty dry sounding cough.  Pt stated that he did not feel food or drink going the wrong way.  Pt reports that vocal quality is normal, but pt sounds somewhat hoarse and cough is fair, but improved in strength with encouragement.  RN reports coughing with swabs of water.  Recommend instrumental swallow evaluation prior to initiation of PO diet.  Radiology schedule cannot accommodate same date MBSS, will proceed with FEES to assess pharygneal swallow function.   SLP Visit Diagnosis: Dysphagia, unspecified (R13.10)    Aspiration Risk  Mild aspiration risk    Diet Recommendation NPO;Free water protocol after oral care (pending results of FEES)   Medication Administration: Via alternative means    Other  Recommendations Oral Care Recommendations: Oral care QID    Recommendations for follow up therapy are one component of a multi-disciplinary discharge planning process, led by the attending physician.  Recommendations may be updated based on patient status, additional functional criteria and insurance  authorization.  Follow up Recommendations  (pending results of FEES)      Assistance Recommended at Discharge  (pending results of FEES)  Functional Status Assessment  (pending results of FEES)  Frequency and Duration  (pending results of FEES)          Prognosis Prognosis for Safe Diet Advancement:  (pending results of FEES)      Swallow Study   General Date of Onset: 05/24/21 HPI: 46 year old man admitted after cardiac arrest at home. PEA arrest, s/p CPR x 20 minutes and Epi x 5. Found by friends with agonal breathing.  Lost pulse. ETT 1/15-19. Remains encephalopathic. Head CT 1/15 with no acute findings.  CXT 1/19: "New patchy right lower lung peribronchial opacity, favor atelectasis" Type of Study: Bedside Swallow Evaluation Previous Swallow Assessment: None Diet Prior to this Study: NPO Temperature Spikes Noted: No Respiratory Status: Nasal cannula History of Recent Intubation: Yes Length of Intubations (days): 4 days Date extubated: 05/28/21 Behavior/Cognition: Alert;Cooperative;Pleasant mood Oral Cavity Assessment: Within Functional Limits (red spot on tongue.  Looks like cut) Oral Care Completed by SLP: No Oral Cavity - Dentition: Adequate natural dentition Self-Feeding Abilities: Needs assist Patient Positioning: Upright in bed Volitional Cough:  (Fair) Volitional Swallow: Able to elicit    Oral/Motor/Sensory Function Overall Oral Motor/Sensory Function: Within functional limits Facial ROM: Within Functional Limits Facial Symmetry: Within Functional Limits Lingual ROM: Within Functional Limits Lingual Symmetry: Within Functional Limits Lingual Strength: Within Functional Limits Velum: Within Functional Limits Mandible: Within Functional Limits   Ice Chips Ice chips: Within functional limits Presentation: Spoon   Thin Liquid Thin Liquid: Impaired Presentation: Cup;Straw Oral Phase Functional Implications: Left anterior spillage Pharyngeal  Phase Impairments:  Cough -  Delayed    Nectar Thick Nectar Thick Liquid: Not tested   Honey Thick Honey Thick Liquid: Not tested   Puree Puree: Within functional limits Presentation: Spoon   Solid     Solid: Within functional limits Presentation: Self Fed      Kerrie Pleasure, MA, CCC-SLP Acute Rehabilitation Services Office: [phone] 05/29/2021,1:15 PM

## 2021-05-29 NOTE — Progress Notes (Signed)
OT Cancellation Note  Patient Details Name: Andres Braun MRN: [id number] DOB: 09-Aug-1975   Cancelled Treatment:    Reason Eval/Treat Not Completed: Other (comment). Pt with non-tunneled femoral hemodialysis catheter but also with OOB orders, have asked the RN to clarify with MD.  [place], OTR/L Acute Rehab Services Pager [phone] Office [phone]    Almon Register 05/29/2021, 8:17 AM

## 2021-05-30 ENCOUNTER — Inpatient Hospital Stay (HOSPITAL_COMMUNITY): Payer: Self-pay

## 2021-05-30 DIAGNOSIS — M21372 Foot drop, left foot: Secondary | ICD-10-CM

## 2021-05-30 DIAGNOSIS — M6281 Muscle weakness (generalized): Secondary | ICD-10-CM

## 2021-05-30 LAB — HEPATITIS B SURFACE ANTIBODY, QUANTITATIVE: Hep B S AB Quant (Post): <3.1 m[IU]/mL — ABNORMAL LOW (ref >9.9)

## 2021-05-30 LAB — RENAL FUNCTION PANEL
Albumin: 1.9 g/dL — ABNORMAL LOW (ref 3.5–5.0)
Anion gap: 13 (ref 5–15)
BUN: 108 mg/dL — ABNORMAL HIGH (ref 6–20)
CO2: 20 mmol/L — ABNORMAL LOW (ref 22–32)
Calcium: 7.7 mg/dL — ABNORMAL LOW (ref 8.9–10.3)
Chloride: 99 mmol/L (ref 98–111)
Creatinine, Ser: 6.54 mg/dL — ABNORMAL HIGH (ref 0.61–1.24)
GFR, Estimated: 10 mL/min — ABNORMAL LOW (ref >60)
Glucose, Bld: 111 mg/dL — ABNORMAL HIGH (ref 70–99)
Phosphorus: 4 mg/dL (ref 2.5–4.6)
Potassium: 3.7 mmol/L (ref 3.5–5.1)
Sodium: 132 mmol/L — ABNORMAL LOW (ref 135–145)

## 2021-05-30 LAB — CBC
HCT: 29.4 % — ABNORMAL LOW (ref 39.0–52.0)
HCT: 28.4 % — ABNORMAL LOW (ref 39.0–52.0)
Hemoglobin: 9.9 g/dL — ABNORMAL LOW (ref 13.0–17.0)
Hemoglobin: 9.6 g/dL — ABNORMAL LOW (ref 13.0–17.0)
MCH: 28.1 pg (ref 26.0–34.0)
MCH: 27.9 pg (ref 26.0–34.0)
MCHC: 33.7 g/dL (ref 30.0–36.0)
MCHC: 33.8 g/dL (ref 30.0–36.0)
MCV: 83.5 fL (ref 80.0–100.0)
MCV: 82.6 fL (ref 80.0–100.0)
Platelets: 145 10*3/uL — ABNORMAL LOW (ref 150–400)
Platelets: 174 10*3/uL (ref 150–400)
RBC: 3.52 MIL/uL — ABNORMAL LOW (ref 4.22–5.81)
RBC: 3.44 MIL/uL — ABNORMAL LOW (ref 4.22–5.81)
RDW: 14.2 % (ref 11.5–15.5)
RDW: 14.1 % (ref 11.5–15.5)
WBC: 16.7 10*3/uL — ABNORMAL HIGH (ref 4.0–10.5)
WBC: 19.4 10*3/uL — ABNORMAL HIGH (ref 4.0–10.5)
nRBC: 0.2 % (ref 0.0–0.2)
nRBC: 0.2 % (ref 0.0–0.2)

## 2021-05-30 LAB — LACTIC ACID, PLASMA
Lactic Acid, Venous: 1.4 mmol/L (ref 0.5–1.9)
Lactic Acid, Venous: 1.2 mmol/L (ref 0.5–1.9)

## 2021-05-30 LAB — CK: Total CK: 13372 U/L — ABNORMAL HIGH (ref 49–397)

## 2021-05-30 LAB — GLUCOSE, CAPILLARY
Glucose-Capillary: 89 mg/dL (ref 70–99)
Glucose-Capillary: 92 mg/dL (ref 70–99)
Glucose-Capillary: 107 mg/dL — ABNORMAL HIGH (ref 70–99)
Glucose-Capillary: 86 mg/dL (ref 70–99)
Glucose-Capillary: 93 mg/dL (ref 70–99)
Glucose-Capillary: 103 mg/dL — ABNORMAL HIGH (ref 70–99)

## 2021-05-30 LAB — MAGNESIUM: Magnesium: 3.3 mg/dL — ABNORMAL HIGH (ref 1.7–2.4)

## 2021-05-30 LAB — OCCULT BLOOD X 1 CARD TO LAB, STOOL: Fecal Occult Bld: POSITIVE — AB

## 2021-05-30 IMAGING — DX DG CHEST 1V PORT
1 series · 1 of 1 positions shown · non-contrast
Comparison: Chest radiograph [DATE]

CLINICAL DATA: History of cardiac arrest.

EXAM:
PORTABLE CHEST 1 VIEW

[chest ap]
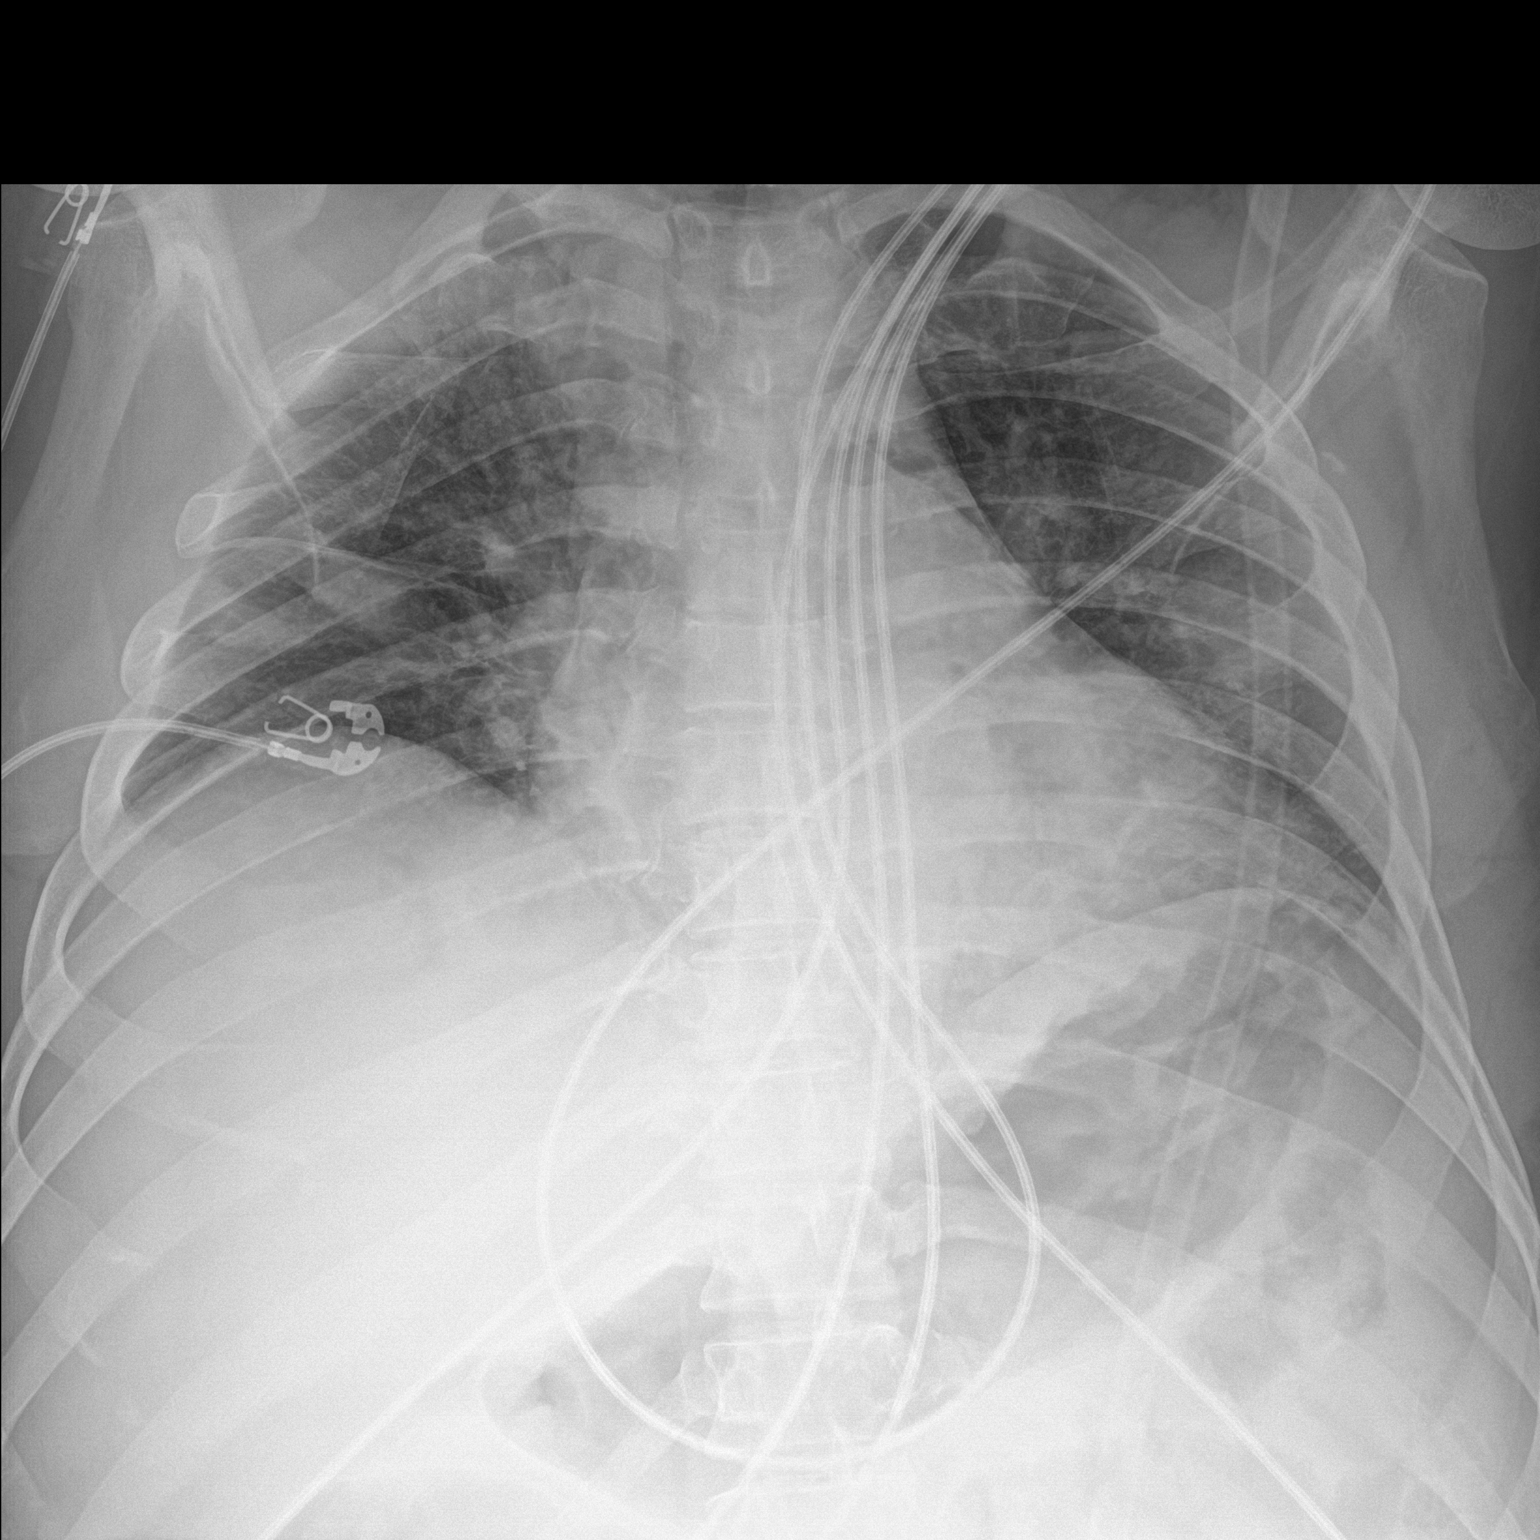

[1 of 1 positions shown; findings below may reference images not displayed]

FINDINGS: Monitoring leads overlie the patient. Stable cardiac and mediastinal
contours. Interval removal of left IJ central venous catheter and
enteric tube as well as extubation. Persistent pneumomediastinum.
Low lung volumes with bibasilar heterogeneous opacities.
Redemonstrated lucency within the left supraclavicular fossa.
IMPRESSION: Interval removal of enteric tube, ET tube and left IJ catheter.

Persistent pneumomediastinum. Redemonstrated lucency left
supraclavicular fossa which may represent additional gas tracking
from the chest.

Low lung volumes with bibasilar opacities favored to represent
atelectasis.

## 2021-05-30 IMAGING — CT CT T SPINE W/O CM
3 of 4 series · 11 of 33 positions shown, 13 images · non-contrast
Comparison: Previous chest and abdominal radiographs.

CLINICAL DATA: 45-year-old male with a history of cardiac arrest.
Possible change in lower extremity movement/sensation.

EXAM:
CT THORACIC AND LUMBAR SPINE WITHOUT CONTRAST
TECHNIQUE: Multidetector CT imaging of the thoracic and lumbar spine was
performed without contrast. Multiplanar CT image reconstructions
were also generated.
RADIATION DOSE REDUCTION: This exam was performed according to the
departmental dose-optimization program which includes automated
exposure control, adjustment of the mA and/or kV according to
patient size and/or use of iterative reconstruction technique.

[Series 4: t-spine 2.0 st · axial · 0.30mm/px · z∈[-446,-280]mm · 3 of 125 slices shown, 4 images]
[im 21/125  soft-tissue]
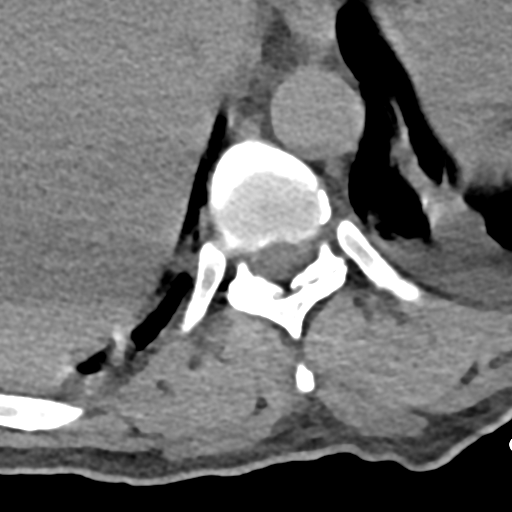
[im 21/125  bone]
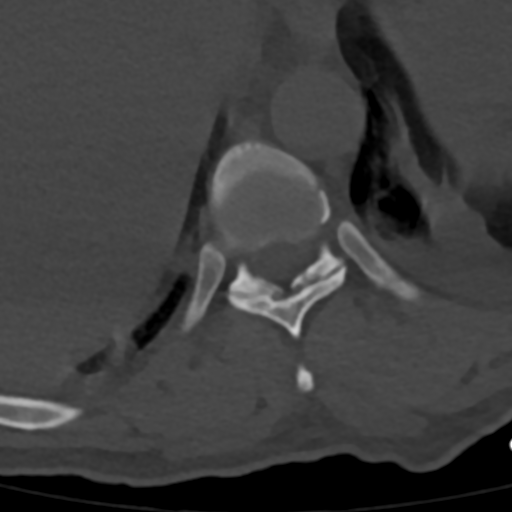
[im 63/125  bone]
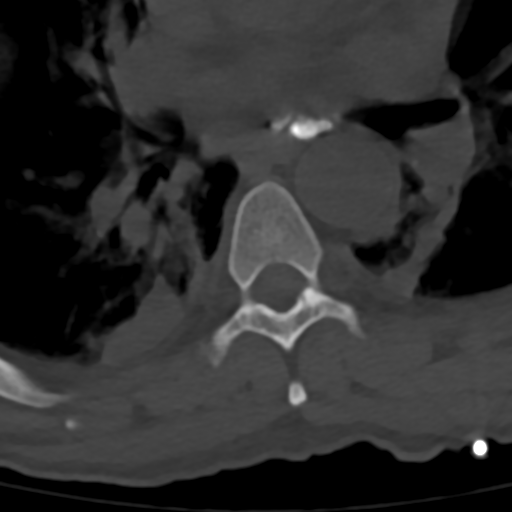
[im 104/125  bone]
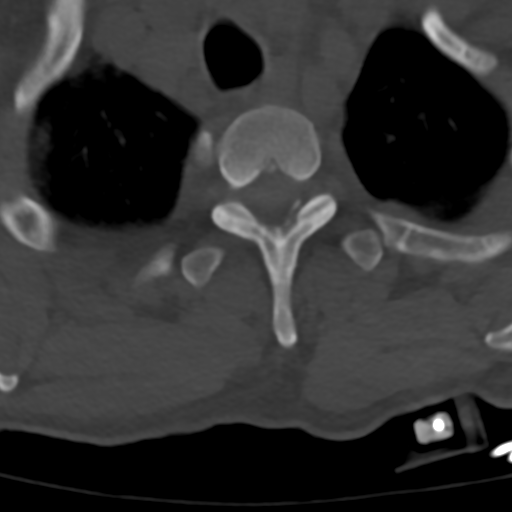

[Series 6: t-spine 2.0 cor bone · coronal · 0.36mm/px · 3 of 83 slices shown]
[im 17/83  bone]
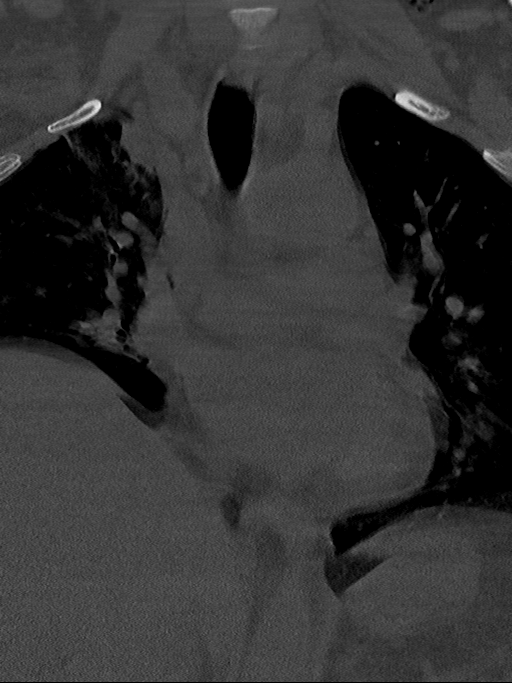
[im 33/83  bone]
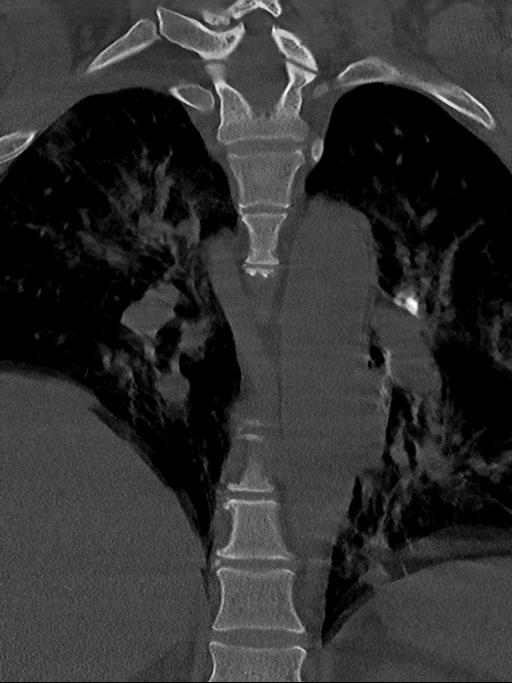
[im 50/83  bone]
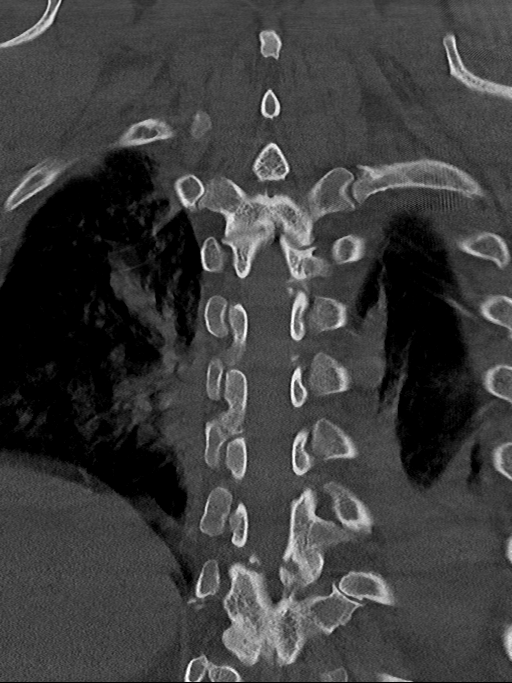

[Series 7: t-spine 2.0 sag bone · sagittal · 0.31mm/px · 5 of 61 slices shown, 6 images]
[im 21/61  bone]
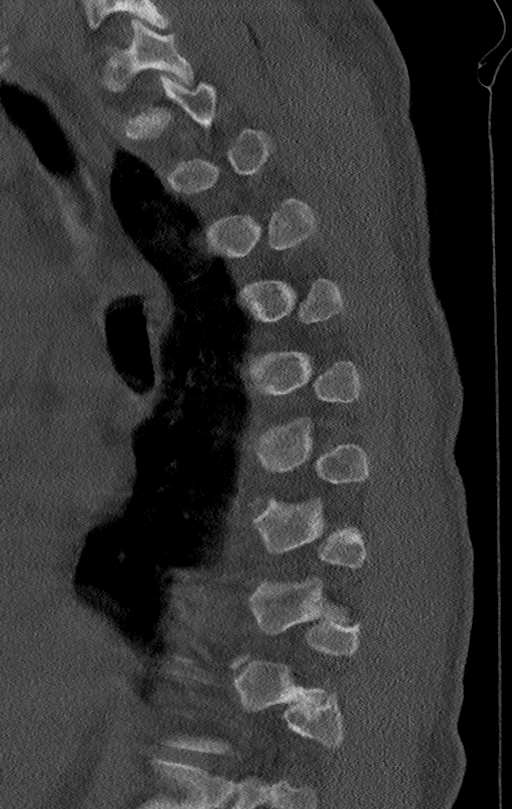
[im 26/61  bone]
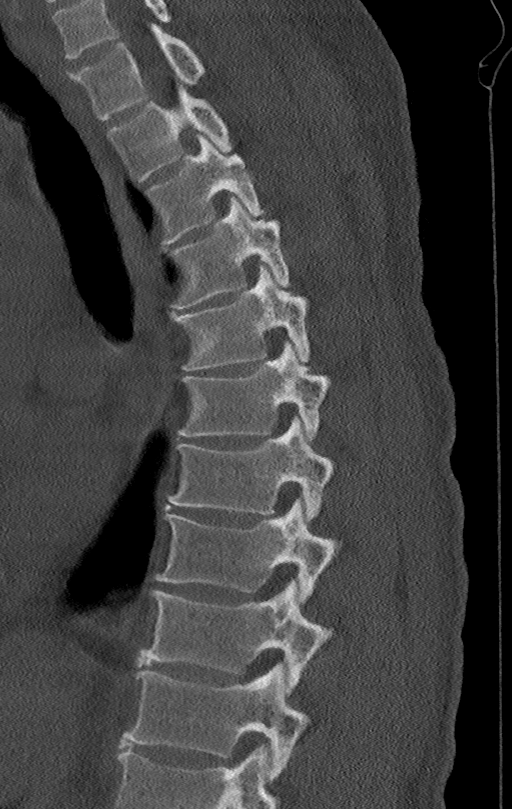
[im 31/61  soft-tissue]
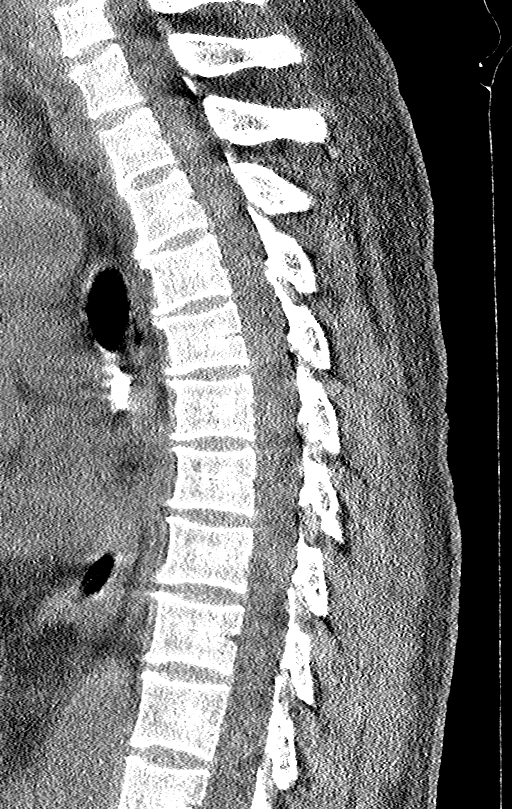
[im 31/61  bone]
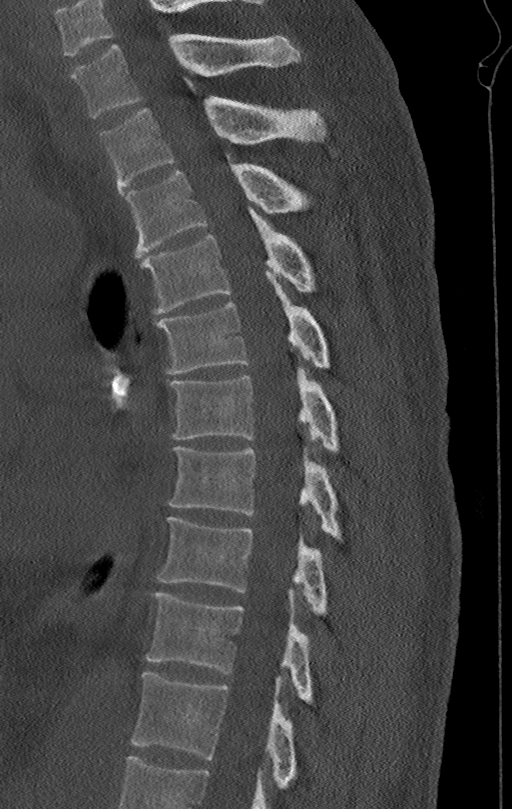
[im 36/61  bone]
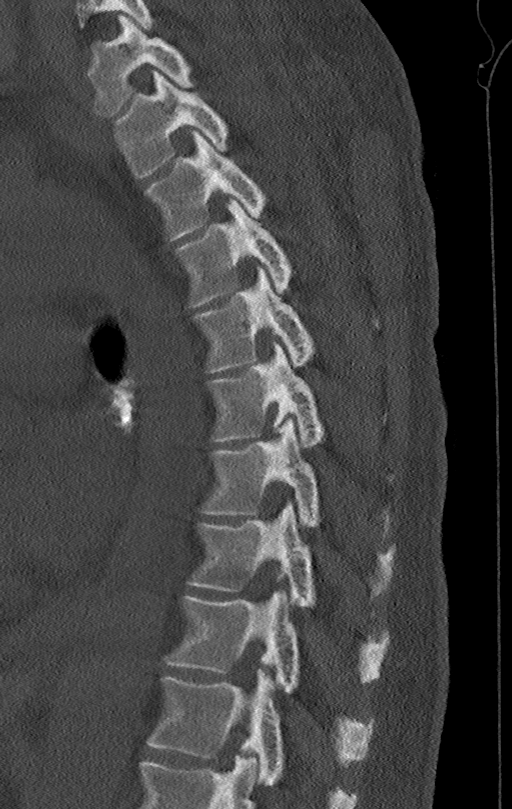
[im 41/61  bone]
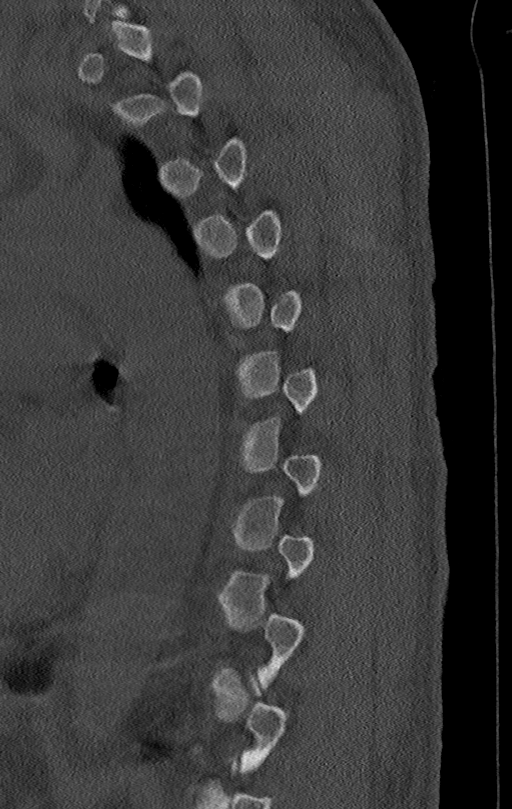

[11 of 33 positions shown; findings below may reference images not displayed]

FINDINGS: CT THORACIC SPINE FINDINGS

Limited cervical spine imaging: Cervicothoracic junction alignment
is within normal limits.

Thoracic spine segmentation: Normal, assuming unfused transverse
process ossification centers at L1.

Alignment:  Preserved thoracic kyphosis. No spondylolisthesis.

Vertebrae: Bone mineralization is within normal limits. Thoracic
vertebrae appear intact. No acute osseous abnormality identified.

Paraspinal and other soft tissues: Scattered, but confluent
peribronchial opacity in the right upper lobe. Right greater than
left streaky and confluent bilateral lower lobe opacity. Trace if
any associated pleural fluid. Visible major airways are patent.
Calcified postinflammatory mediastinal and left hilar lymph nodes.
No evidence of pericardial effusion.

Negative visible noncontrast upper abdominal viscera.

Thoracic paraspinal soft tissues appear normal.

Disc levels:

Mild for age thoracic spine degeneration. There is lower thoracic
posterior element hypertrophy, including calcified ligament flavum
hypertrophy bilaterally at T11-T12. However, no CT evidence of
thoracic disc herniation or thoracic spinal stenosis.

CT LUMBAR SPINE FINDINGS

Segmentation: Normal, with bilateral L1 unfused transverse process
ossification centers (normal variant).

Alignment: Preserved lumbar lordosis.

Vertebrae: No acute osseous abnormality identified. Bone
mineralization within normal limits. Visible sacrum and SI joints
appear intact.

Paraspinal and other soft tissues: Moderate volume of pelvic free
fluid, simple fluid density. There is a right lower extremity
approach central venous catheter in place, terminates at the
junction of the right internal and external iliac veins. Punctate
nephrolithiasis.

Partially visible probable ingested tooth within a small bowel loop
in the right abdomen best seen on coronal image 10. No dilated bowel
identified.

Otherwise negative visible noncontrast abdominal viscera.

Lumbar paraspinal soft tissues appear negative.

Disc levels: Mild for age lumbar spine degeneration. Lower lumbar
disc bulging maximal at L4-L5. But only mild if any associated
lumbar spinal stenosis.
IMPRESSION: 1. No acute osseous abnormality in the thoracic or lumbar spine.
Mild for age thoracic and lumbar spine degeneration. No CT evidence
of thoracic spinal stenosis. And only mild if any lumbar spinal
stenosis related to disc bulging at L4-L5.
2. Right Upper Lobe Bronchopneumonia. Confluent lower lobe opacity
could reflect lung collapse versus additional lower lobe pneumonia.
Trace if any associated pleural fluid.
3. Moderate volume of pelvic free fluid, nonspecific although with
simple fluid density favoring a transudate.
4. Ingested Tooth suspected in a right abdominal small bowel loop
(series 5, image 10 of the lumbar study).
5. Nephrolithiasis.

## 2021-05-30 IMAGING — CT CT ABD-PELV W/O CM
2 of 4 series · 15 of 46 positions shown, 17 images · non-contrast
Comparison: None

CLINICAL DATA: Abdominal pain.



[Series 3: a/p w/o 5mm · axial · non-contrast · 0.86mm/px · z∈[+817,+1302]mm · 12 of 107 slices shown, 14 images]
[im 5/107  soft-tissue]
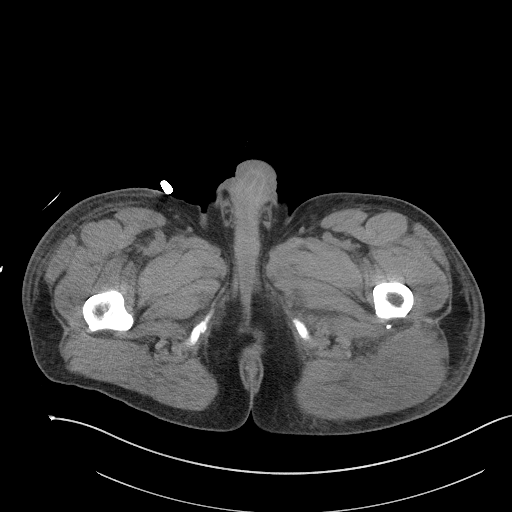
[im 5/107  bone]
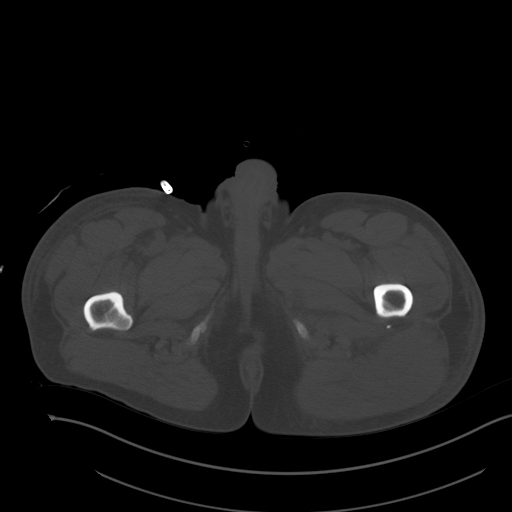
[im 14/107  soft-tissue]
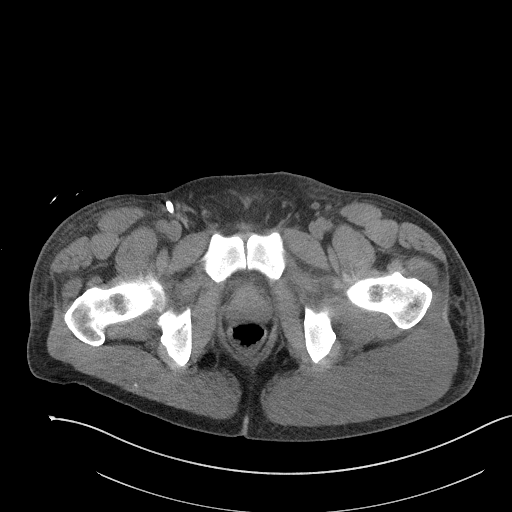
[im 24/107  soft-tissue]
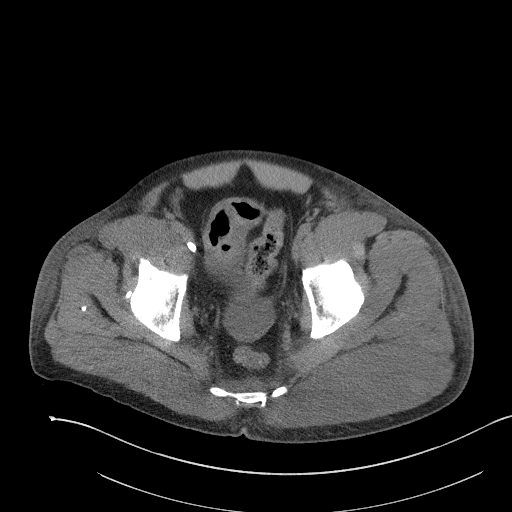
[im 33/107  soft-tissue]
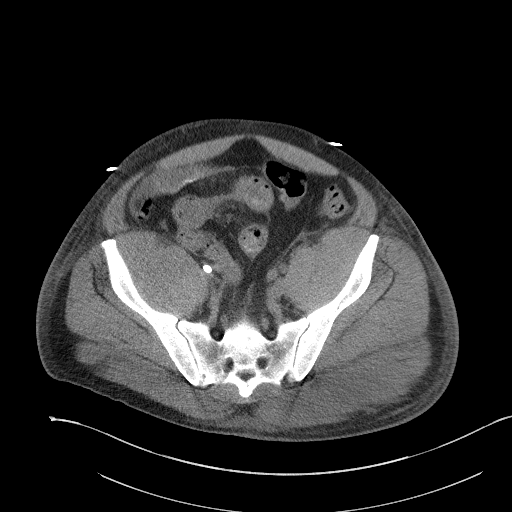
[im 42/107  soft-tissue]
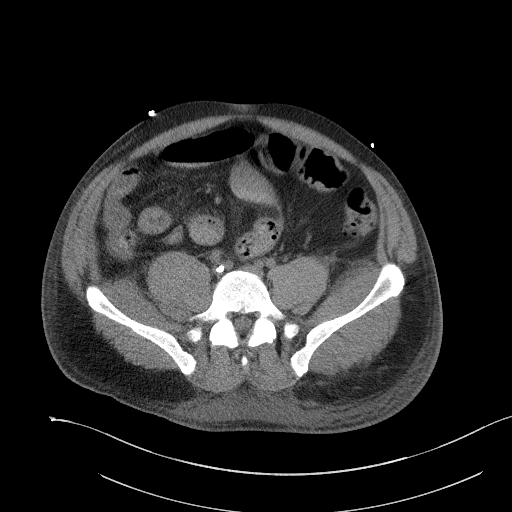
[im 51/107  soft-tissue]
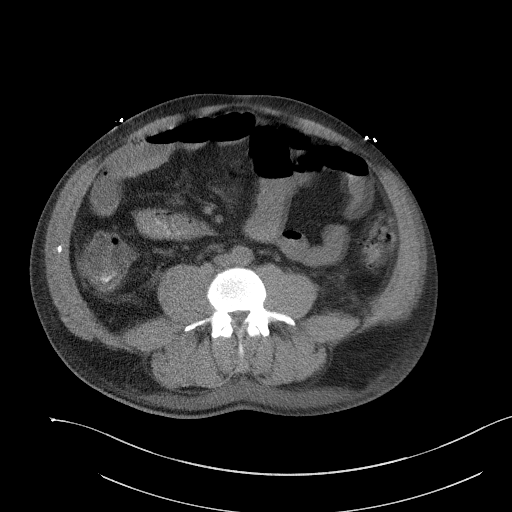
[im 56/107  soft-tissue]
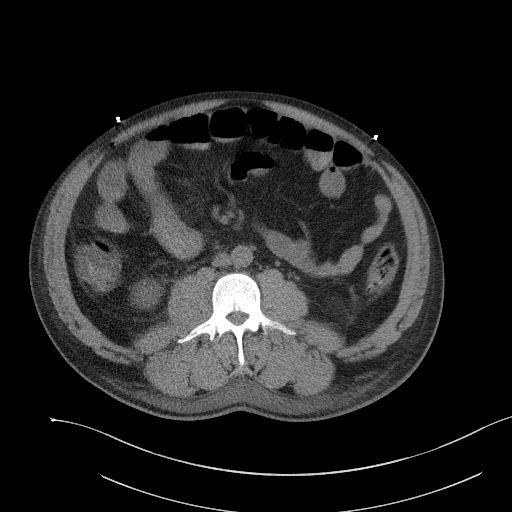
[im 65/107  soft-tissue]
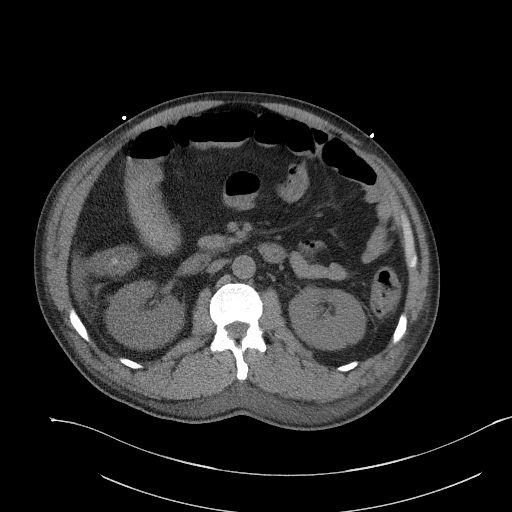
[im 74/107  soft-tissue]
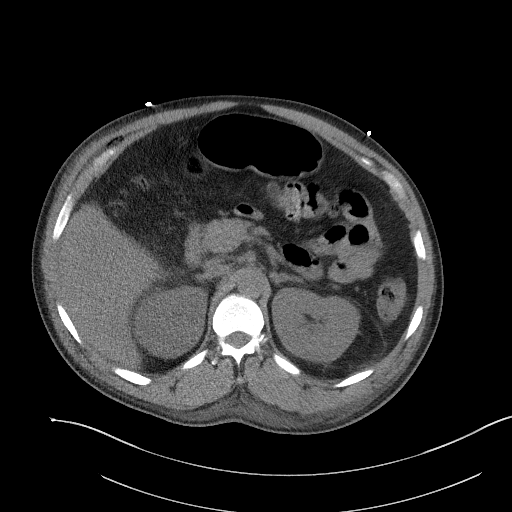
[im 74/107  bone]
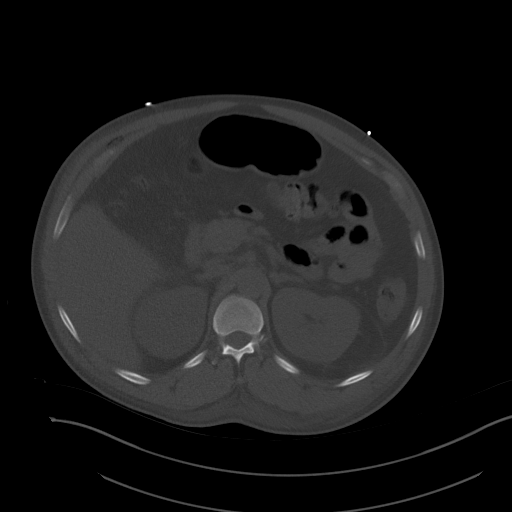
[im 83/107  soft-tissue]
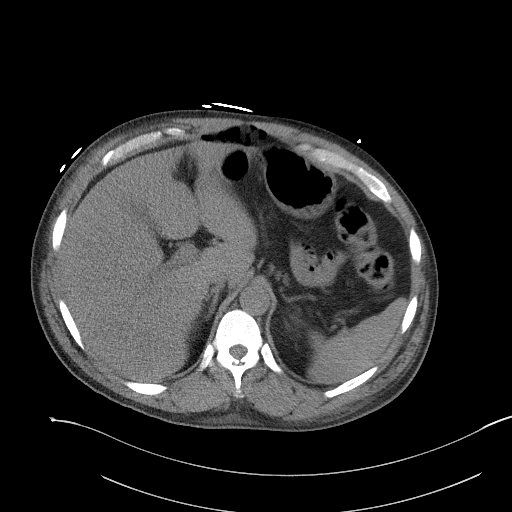
[im 93/107  soft-tissue]
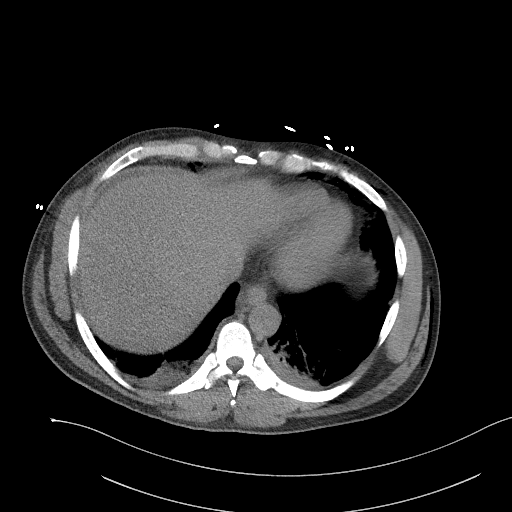
[im 102/107  soft-tissue]
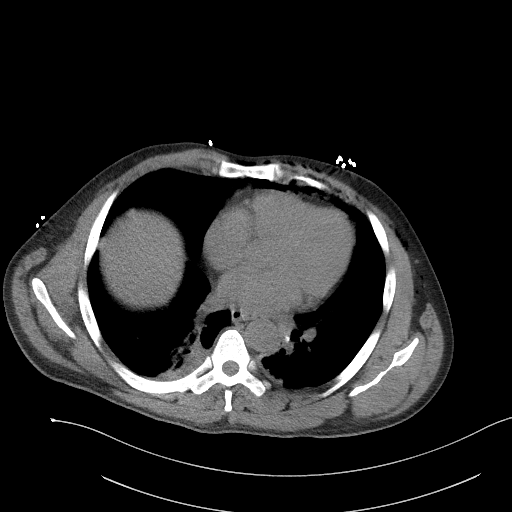

[Series 6: a/p w/o cor · coronal · non-contrast · 1.01mm/px · 3 of 161 slices shown]
[im 54/161  soft-tissue]
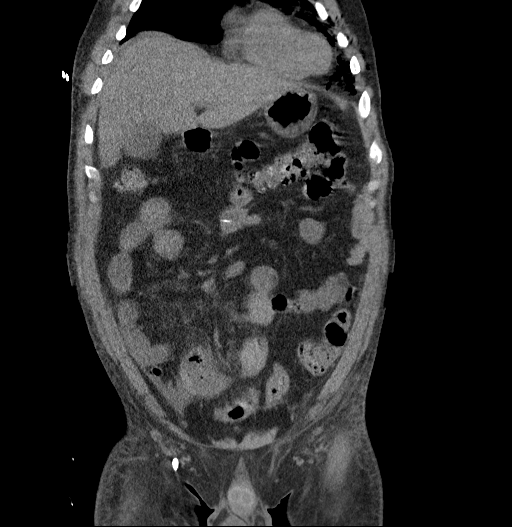
[im 72/161  soft-tissue]
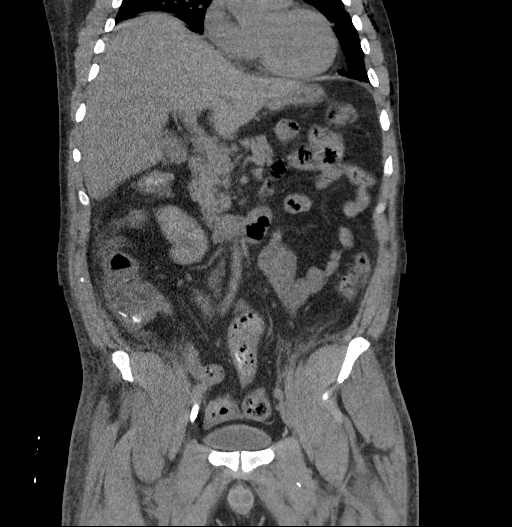
[im 89/161  soft-tissue]
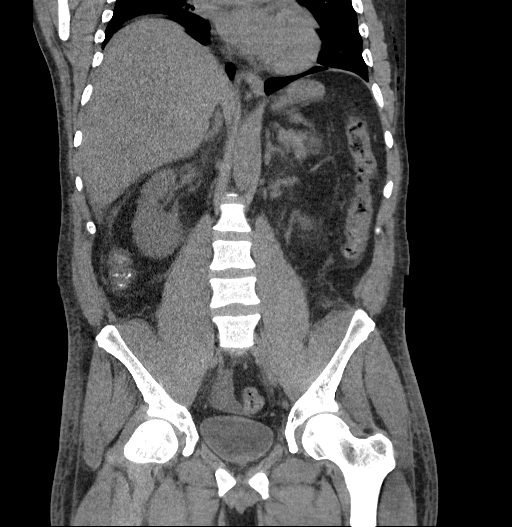

[15 of 46 positions shown; findings below may reference images not displayed]

FINDINGS: Lower chest: Signs of pneumomediastinum identified. Gas is seen
tracking into bilateral cardio phrenic angles. There is also an
subcutaneous gas identified along the ventral chest and ventral
abdominal wall.

Bilateral lower lobe airspace opacities are identified. There also
scattered nodular densities noted within the lingula and right
middle lobe.

Hepatobiliary: No focal liver abnormality identified. Gallbladder
appears unremarkable.

Pancreas: Unremarkable. No pancreatic ductal dilatation or
surrounding inflammatory changes.

Spleen: Normal in size without focal abnormality.

Adrenals/Urinary Tract: Normal adrenal glands. No nephrolithiasis,
there is a punctate stone within the inferior pole of the left
kidney. No hydronephrosis or mass identified bilaterally. Urinary
bladder contains a small amount of gas.

Stomach/Bowel: The stomach appears normal. There is abnormal bowel
wall edema with surrounding inflammation involving the ileum and
ascending colon tiny focus of gas noted within the wall of the
edematous loop of bowel, image 61/6. There is diffuse surrounding
fat stranding and a small volume of free fluid which extends into
the pelvis. Mild generalized increase caliber of the small bowel
loops proximal to the ileum noted which measure up to 2.9 cm.

Vascular/Lymphatic: Normal appearance of the abdominal aorta. Within
the limitations of unenhanced technique no signs of portal venous
gas identified at this time no abdominopelvic adenopathy.

Reproductive: Prostate is unremarkable.

Other: Trace perihepatic ascites noted. There is also a small volume
of free fluid noted within the pelvis. No discrete fluid collections
identified.

Musculoskeletal: No acute or significant osseous findings. There are
nondisplaced fractures involving the costochondral cartilage of the
anterior right fifth and sixth ribs which may is likely related to
recent CPR.
IMPRESSION: 1. Examination is positive for pneumomediastinum with subcutaneous
gas identified along the ventral chest and ventral abdominal wall.
2. Bilateral lower lobe airspace opacities with scattered nodular
densities noted within the lingula and right middle lobe. Findings
are favored to represent multifocal pneumonia.
3. Abnormal bowel wall edema with surrounding inflammation involving
the ileum and ascending colon. A tiny focus of gas noted within the
wall of the edematous loop of bowel is concerning for small bowel
ischemia. In the setting of recent cardiac arrest findings may
reflect ischemic enteritis and colitis.
4. Mild generalized increase caliber of the small bowel loops
proximal to the ileum which measure up to 2.9 cm. No signs of
high-grade bowel obstruction.
5. Small volume of free fluid noted within the abdomen and pelvis.
No discrete fluid collections identified to suggest abscess.
6. Nondisplaced fractures involving the costochondral cartilage of
the anterior right fifth and sixth ribs which may is likely related
to recent CPR.

## 2021-05-30 IMAGING — CT CT L SPINE W/O CM
3 series · 10 of 33 positions shown, 12 images · non-contrast
Comparison: Previous chest and abdominal radiographs.

CLINICAL DATA: 45-year-old male with a history of cardiac arrest.
Possible change in lower extremity movement/sensation.

EXAM:
CT THORACIC AND LUMBAR SPINE WITHOUT CONTRAST
TECHNIQUE: Multidetector CT imaging of the thoracic and lumbar spine was
performed without contrast. Multiplanar CT image reconstructions
were also generated.
RADIATION DOSE REDUCTION: This exam was performed according to the
departmental dose-optimization program which includes automated
exposure control, adjustment of the mA and/or kV according to
patient size and/or use of iterative reconstruction technique.

[Series 3: l-spine 2.0 st · axial · 0.29mm/px · z∈[-670,-532]mm · 2 of 151 slices shown, 3 images]
[im 47/151  soft-tissue]
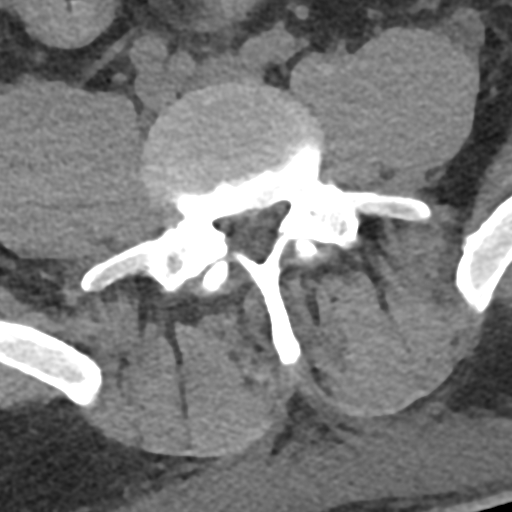
[im 47/151  bone]
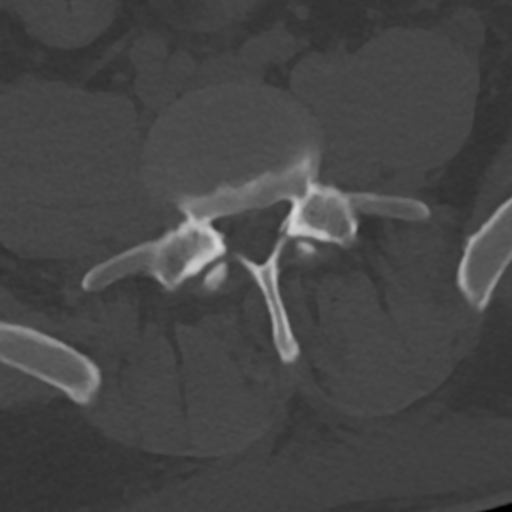
[im 116/151  bone]
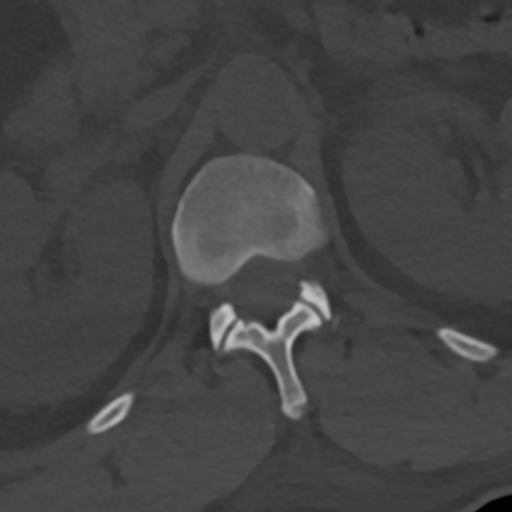

[Series 7: l-spine 2.0 cor · coronal · 0.41mm/px · 3 of 85 slices shown]
[im 17/85  bone]
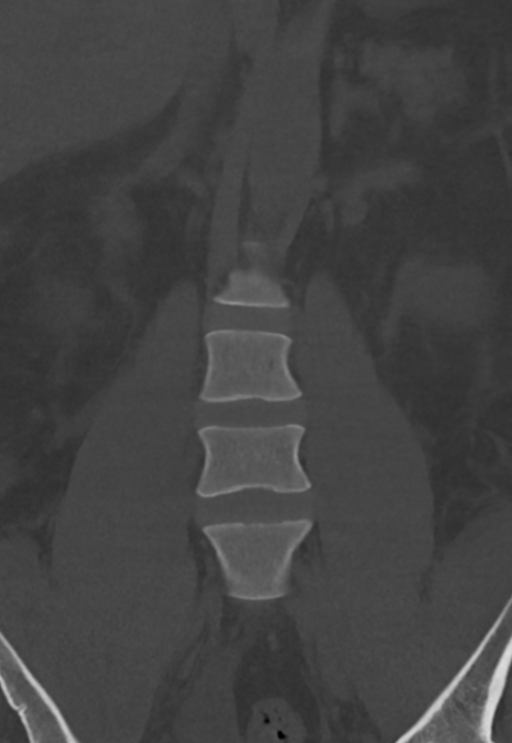
[im 34/85  bone]
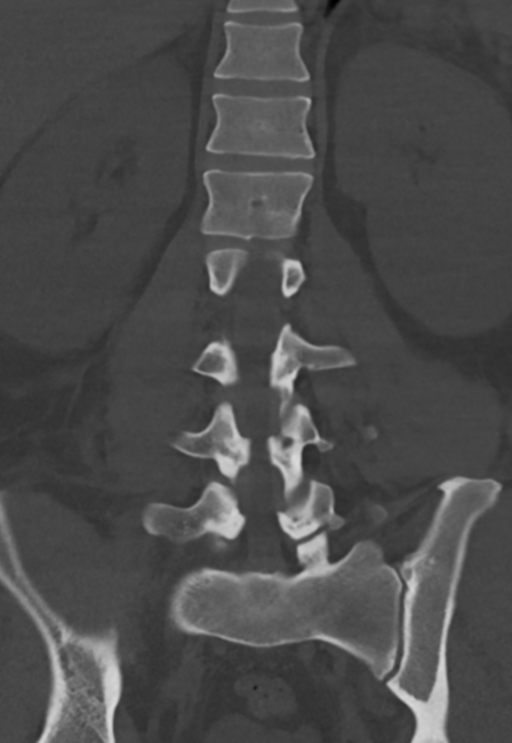
[im 51/85  bone]
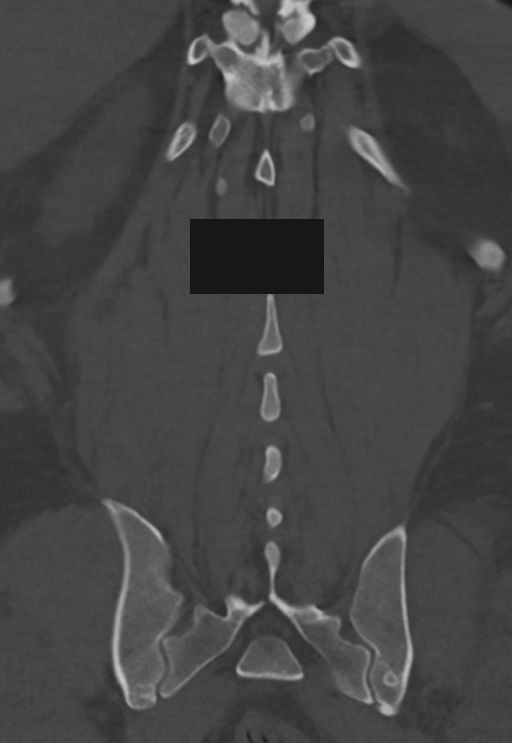

[Series 8: l-spine 2.0 sag · sagittal · 0.34mm/px · 5 of 74 slices shown, 6 images]
[im 25/74  bone]
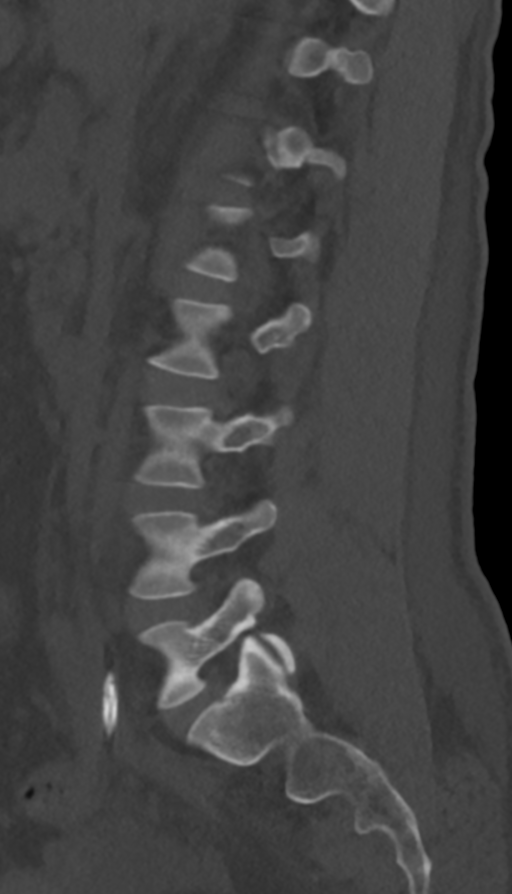
[im 31/74  bone]
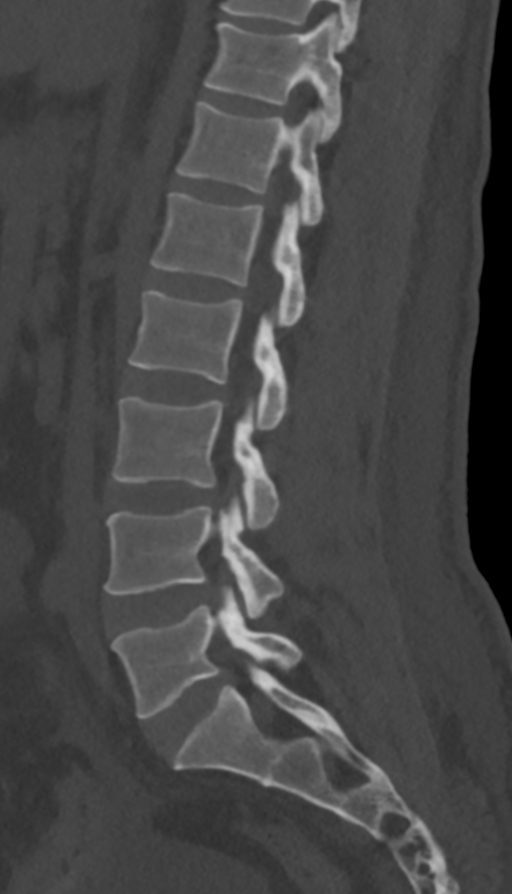
[im 37/74  soft-tissue]
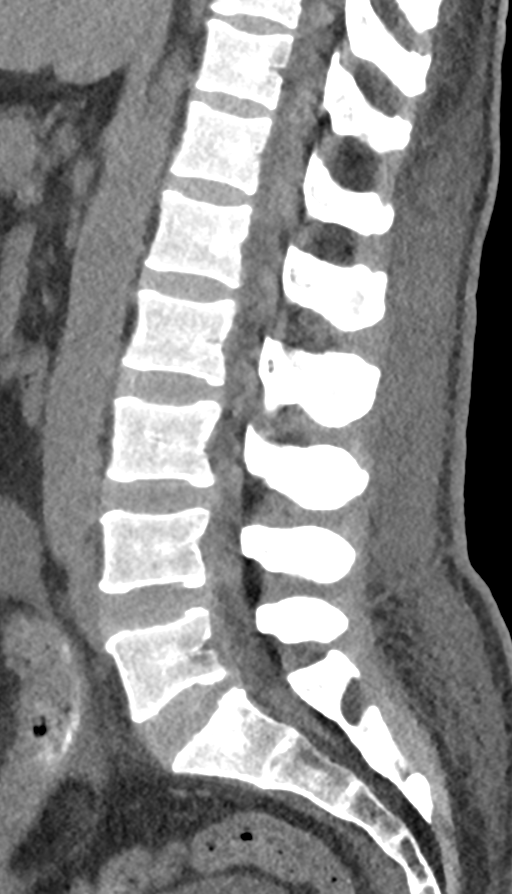
[im 37/74  bone]
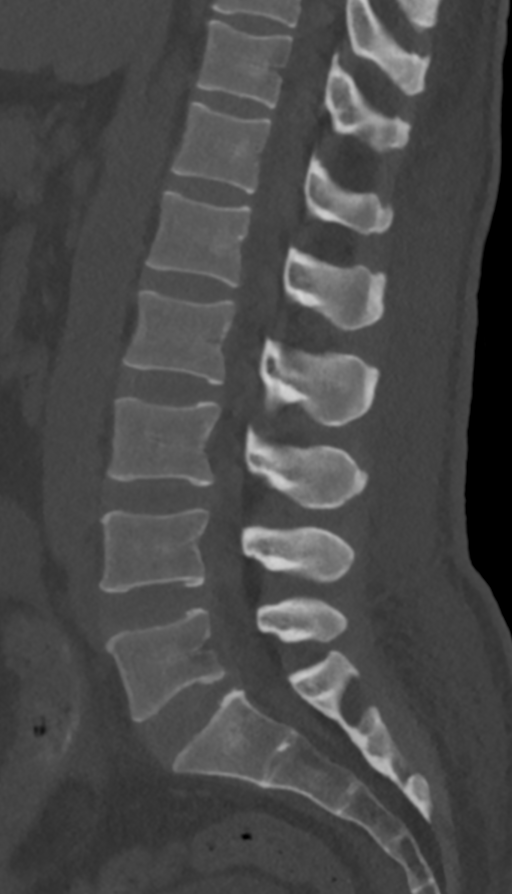
[im 43/74  bone]
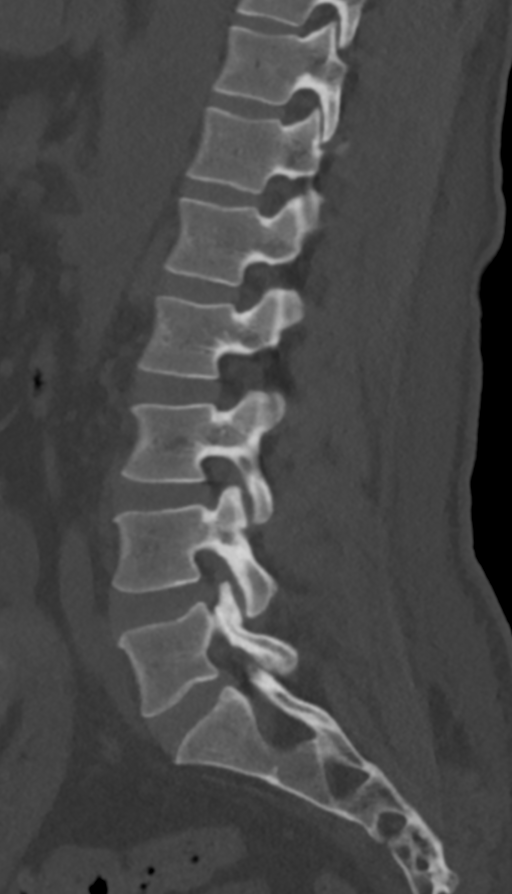
[im 49/74  bone]
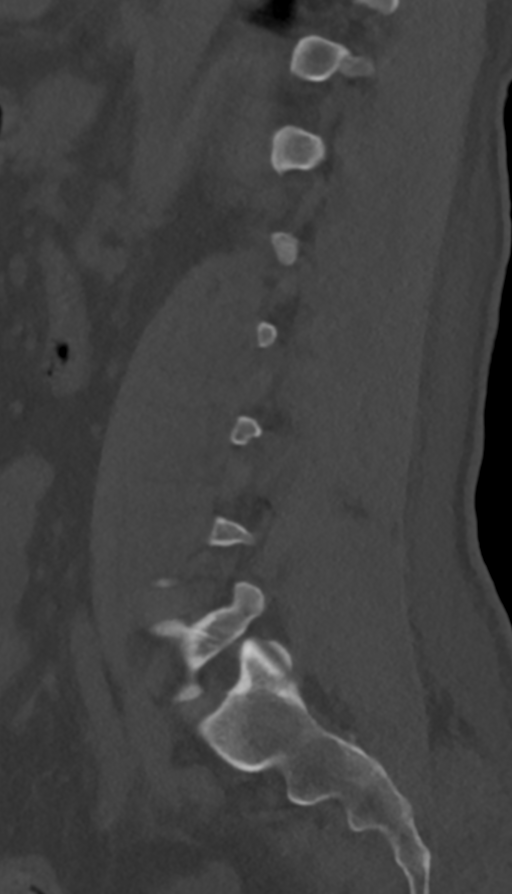

[10 of 33 positions shown; findings below may reference images not displayed]

FINDINGS: CT THORACIC SPINE FINDINGS

Limited cervical spine imaging: Cervicothoracic junction alignment
is within normal limits.

Thoracic spine segmentation: Normal, assuming unfused transverse
process ossification centers at L1.

Alignment:  Preserved thoracic kyphosis. No spondylolisthesis.

Vertebrae: Bone mineralization is within normal limits. Thoracic
vertebrae appear intact. No acute osseous abnormality identified.

Paraspinal and other soft tissues: Scattered, but confluent
peribronchial opacity in the right upper lobe. Right greater than
left streaky and confluent bilateral lower lobe opacity. Trace if
any associated pleural fluid. Visible major airways are patent.
Calcified postinflammatory mediastinal and left hilar lymph nodes.
No evidence of pericardial effusion.

Negative visible noncontrast upper abdominal viscera.

Thoracic paraspinal soft tissues appear normal.

Disc levels:

Mild for age thoracic spine degeneration. There is lower thoracic
posterior element hypertrophy, including calcified ligament flavum
hypertrophy bilaterally at T11-T12. However, no CT evidence of
thoracic disc herniation or thoracic spinal stenosis.

CT LUMBAR SPINE FINDINGS

Segmentation: Normal, with bilateral L1 unfused transverse process
ossification centers (normal variant).

Alignment: Preserved lumbar lordosis.

Vertebrae: No acute osseous abnormality identified. Bone
mineralization within normal limits. Visible sacrum and SI joints
appear intact.

Paraspinal and other soft tissues: Moderate volume of pelvic free
fluid, simple fluid density. There is a right lower extremity
approach central venous catheter in place, terminates at the
junction of the right internal and external iliac veins. Punctate
nephrolithiasis.

Partially visible probable ingested tooth within a small bowel loop
in the right abdomen best seen on coronal image 10. No dilated bowel
identified.

Otherwise negative visible noncontrast abdominal viscera.

Lumbar paraspinal soft tissues appear negative.

Disc levels: Mild for age lumbar spine degeneration. Lower lumbar
disc bulging maximal at L4-L5. But only mild if any associated
lumbar spinal stenosis.
IMPRESSION: 1. No acute osseous abnormality in the thoracic or lumbar spine.
Mild for age thoracic and lumbar spine degeneration. No CT evidence
of thoracic spinal stenosis. And only mild if any lumbar spinal
stenosis related to disc bulging at L4-L5.
2. Right Upper Lobe Bronchopneumonia. Confluent lower lobe opacity
could reflect lung collapse versus additional lower lobe pneumonia.
Trace if any associated pleural fluid.
3. Moderate volume of pelvic free fluid, nonspecific although with
simple fluid density favoring a transudate.
4. Ingested Tooth suspected in a right abdominal small bowel loop
(series 5, image 10 of the lumbar study).
5. Nephrolithiasis.

## 2021-05-30 IMAGING — CT CT HEAD CODE STROKE
2 series · 15 of 30 positions shown, 17 images · non-contrast
Comparison: Head CT [DATE].

CLINICAL DATA: 45-year-old male with a history of cardiac arrest.
Possible lower extremity weakness or change in sensation, but moving
all extremities in the CT department, code stroke status canceled.



[Series 3: head without · axial · non-contrast · 0.43mm/px · z∈[-128,-4]mm · 7 of 35 slices shown, 9 images]
[im 5/35  brain]
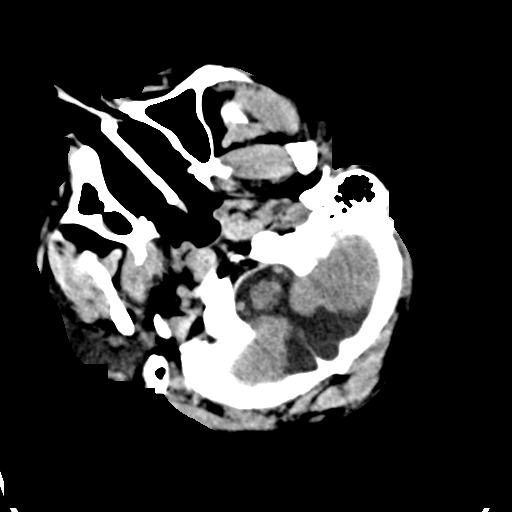
[im 5/35  bone]
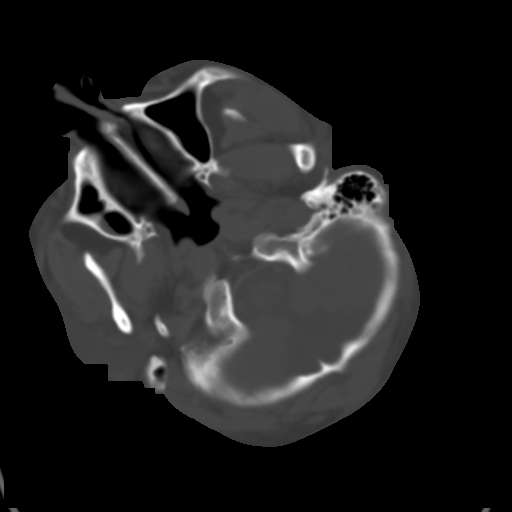
[im 9/35  brain]
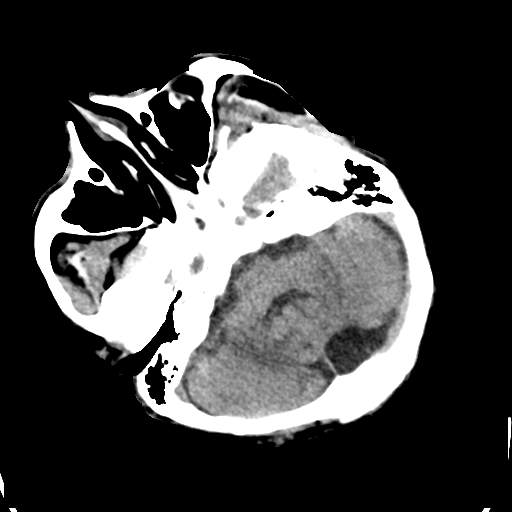
[im 13/35  brain]
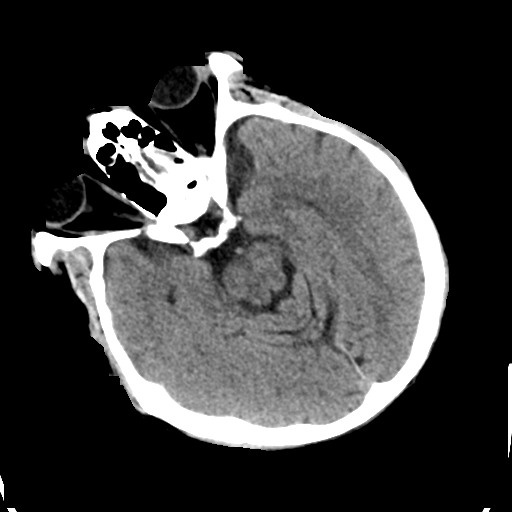
[im 18/35  brain]
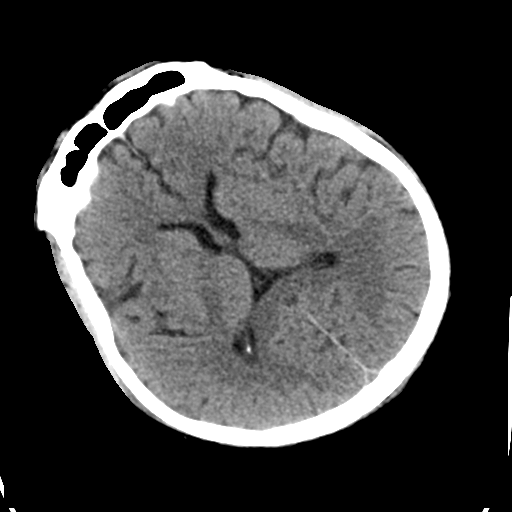
[im 22/35  brain]
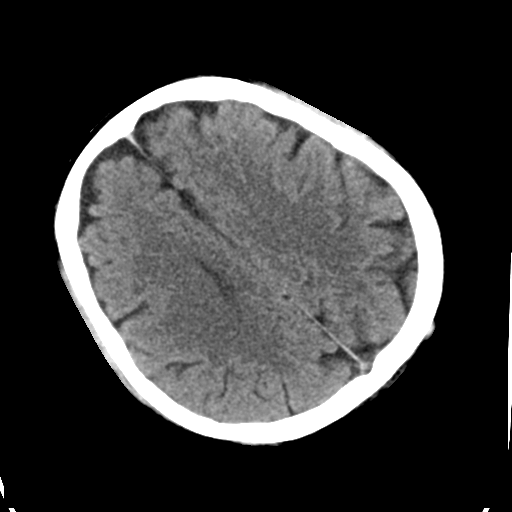
[im 22/35  bone]
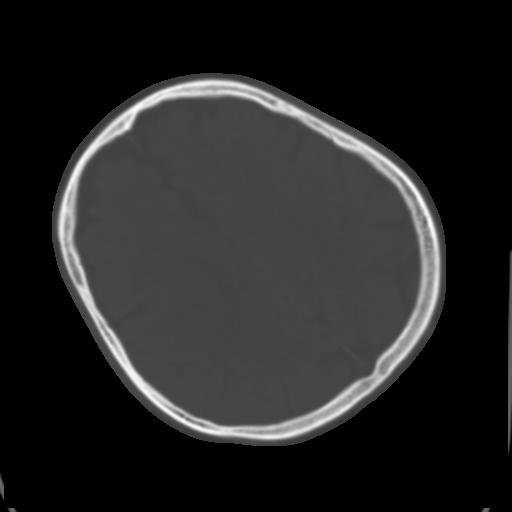
[im 26/35  brain]
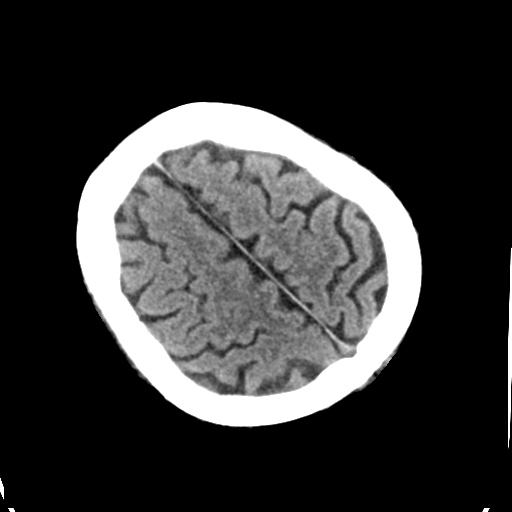
[im 30/35  brain]
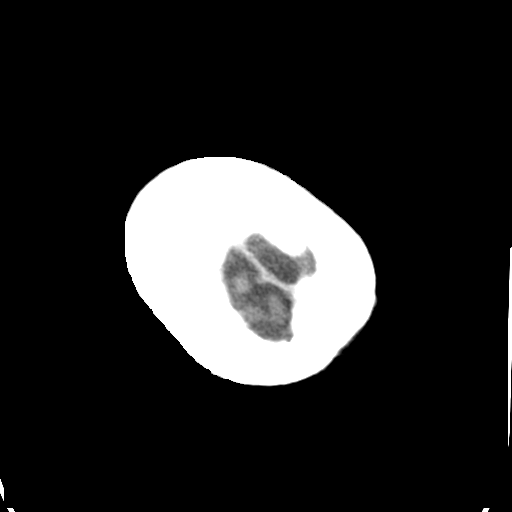

[Series 4: head bone · axial · 0.43mm/px · z∈[-132,+8]mm · 8 of 88 slices shown]
[im 9/88  bone]
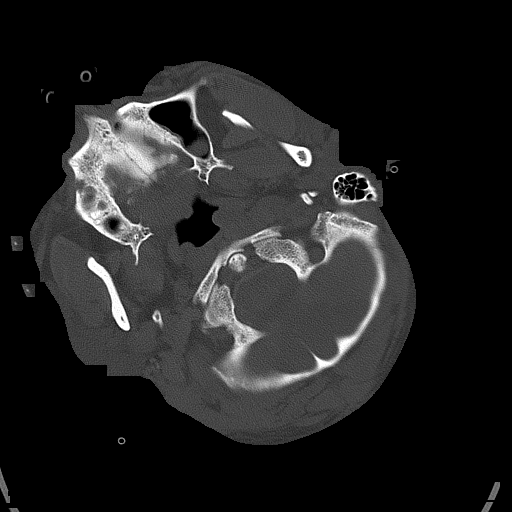
[im 18/88  bone]
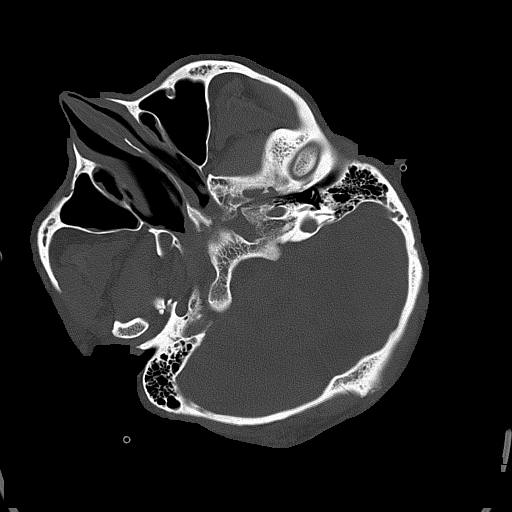
[im 27/88  bone]
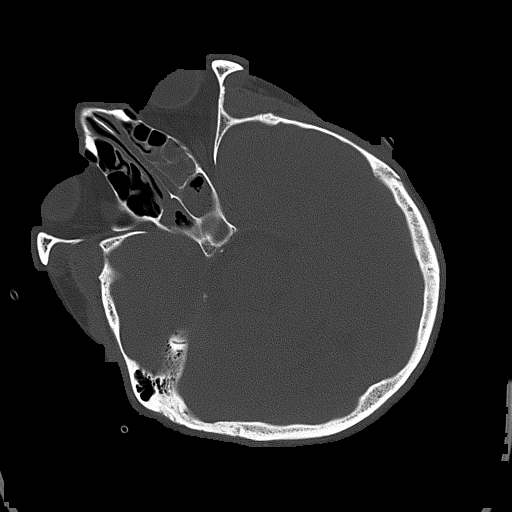
[im 40/88  bone]
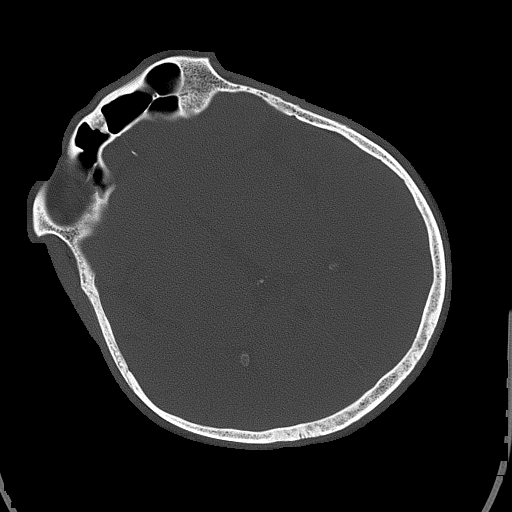
[im 48/88  bone]
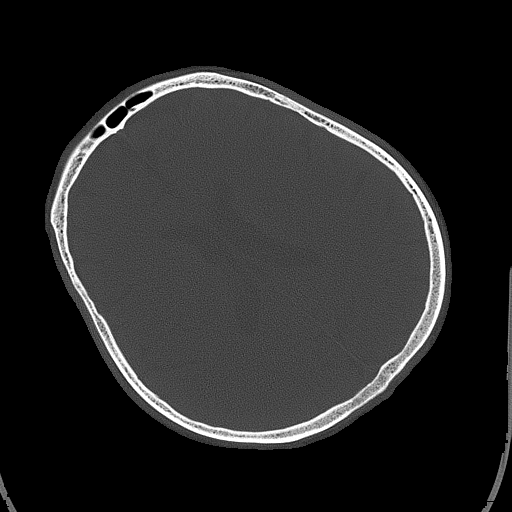
[im 61/88  bone]
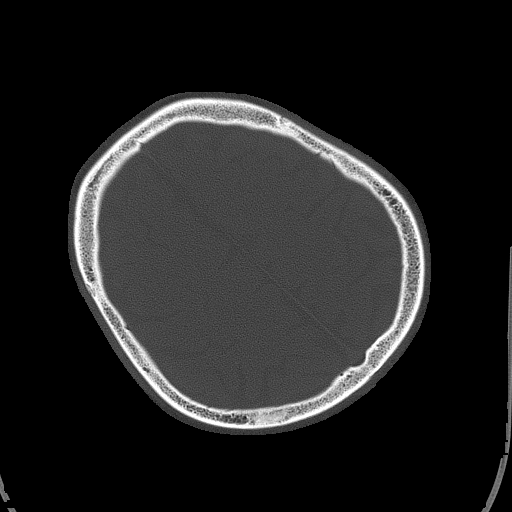
[im 70/88  bone]
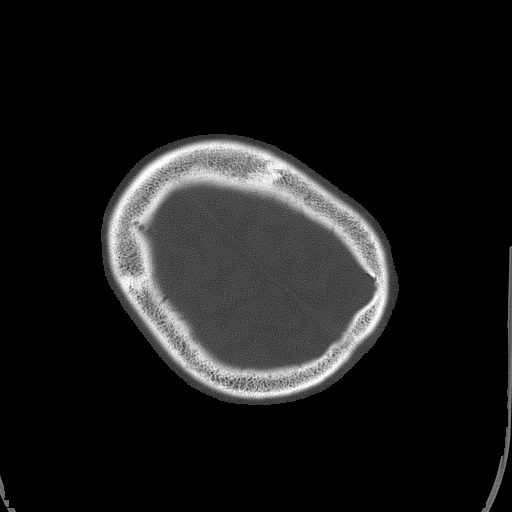
[im 79/88  bone]
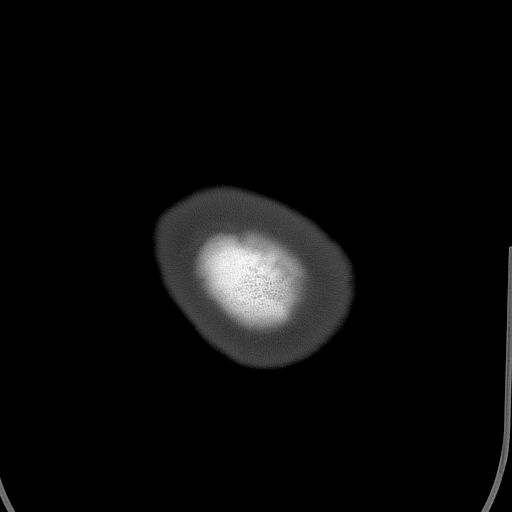

[15 of 30 positions shown; findings below may reference images not displayed]

FINDINGS: Brain: Normal cerebral volume. No midline shift, ventriculomegaly,
mass effect, evidence of mass lesion, intracranial hemorrhage or
evidence of cortically based acute infarction. Gray-white matter
differentiation is within normal limits throughout the brain. Mega
cisterna magna, normal variant.

Vascular: No suspicious intracranial vascular hyperdensity.

Skull: Stable, intact.

Sinuses/Orbits: Extubated now. Moderate posterior paranasal sinus
mucosal thickening and bubbly opacity persists. Tympanic cavities
and mastoids are clear.

Other: Trace retained secretions in the visible pharynx. Negative
orbit and scalp soft tissues.

ASPECTS ([place] Stroke Program Early CT Score)

Total score (0-10 with 10 being normal): 10
IMPRESSION: 1. Stable and normal noncontrast CT appearance of the brain.
2. Paranasal sinus opacification following intubation.

## 2021-05-30 MED ORDER — FENTANYL CITRATE PF 50 MCG/ML IJ SOSY
50.0000 ug | PREFILLED_SYRINGE | INTRAMUSCULAR | Status: DC | PRN
  Administered 2021-05-30 – 2021-05-31 (×2): 50 ug via INTRAVENOUS
  Filled 2021-05-30 (×2): qty 1

## 2021-05-30 MED ORDER — SODIUM CHLORIDE 0.9 % IV SOLN: INTRAVENOUS | Status: DC

## 2021-05-30 MED ORDER — LACTATED RINGERS IV SOLN: INTRAVENOUS | Status: DC

## 2021-05-30 MED ORDER — METRONIDAZOLE 500 MG/100ML IV SOLN
500.0000 mg | Freq: Two times a day (BID) | INTRAVENOUS | Status: DC
  Administered 2021-05-30 – 2021-06-01 (×6): 500 mg via INTRAVENOUS
  Filled 2021-05-30 (×7): qty 100

## 2021-05-30 MED ORDER — ORAL CARE MOUTH RINSE
15.0000 mL | Freq: Two times a day (BID) | OROMUCOSAL | Status: DC
  Administered 2021-05-30 – 2021-06-01 (×5): 15 mL via OROMUCOSAL

## 2021-05-30 MED ORDER — DEXMEDETOMIDINE HCL IN NACL 400 MCG/100ML IV SOLN
0.4000 ug/kg/h | INTRAVENOUS | Status: DC
  Administered 2021-05-30: 0.4 ug/kg/h via INTRAVENOUS
  Administered 2021-05-30: 0.6 ug/kg/h via INTRAVENOUS
  Administered 2021-05-31 (×2): 0.8 ug/kg/h via INTRAVENOUS
  Administered 2021-05-31: 06:00:00 0.7 ug/kg/h via INTRAVENOUS
  Filled 2021-05-30 (×6): qty 100

## 2021-05-30 MED ORDER — SODIUM CHLORIDE 0.9 % IV SOLN
2.0000 g | INTRAVENOUS | Status: DC
  Administered 2021-05-30 – 2021-06-01 (×3): 2 g via INTRAVENOUS
  Filled 2021-05-30 (×5): qty 20

## 2021-05-30 NOTE — Consult Note (Signed)
Neurology Consultation Reason for Consult: Left leg weakness Referring Physician: Warrick Parisian, [place]  CC: Left leg weakness  History is obtained from: Patient  HPI: Andres Braun is a 46 y.o. male who presented on 1/15 with respiratory arrest.  His CKs were greater than 50,000 on 1/16 but I do not see it being checked since then.  He has developed AKI with creatinine over six.  It was noted the morning of 1/20 by both nephrology and physical therapy that he was having left leg weakness.  This evening, he has been complaining of significant pain in his left leg and when nursing touch base with critical care about the pain medicine requirement, he was evaluated via [facility] where it was noted that he had the left-sided weakness and a code stroke was activated.  He is Spanish-speaking only, and has been encephalopathic since extubation.   LKW: Unclear tpa given?: no, unclear time of onset   ROS:   Unable to obtain due to altered mental status.   History reviewed. No pertinent past medical history.   History reviewed. No pertinent family history.   Social History:  has no history on file for tobacco use, alcohol use, and drug use.   Exam: Current vital signs: BP (!) 143/80    Pulse (!) 108    Temp (!) 97.5 F (36.4 C) (Axillary)    Resp 13    Ht 6' (1.829 m)    Wt 86.6 kg    SpO2 91%    BMI 25.89 kg/m  Vital signs in last 24 hours: Temp:  [97.5 F (36.4 C)-98.9 F (37.2 C)] 97.5 F (36.4 C) (01/21 0348) Pulse Rate:  [97-116] 108 (01/21 0500) Resp:  [13-35] 13 (01/21 0500) BP: (116-176)/(76-104) 143/80 (01/21 0500) SpO2:  [88 %-99 %] 91 % (01/21 0500)   Physical Exam  Constitutional: Appears well-developed and well-nourished.  Psych: Appears agitated Eyes: No scleral injection HENT: No OP obstruction MSK: He has a significantly tense left gluteal region with bruising over it, the thigh feels relatively soft Cardiovascular: Distal pulses are good   Neuro: Mental Status: Patient  is awake, alert, knows that he is in the hospital, and gives year, but when asking the month he responds with "19..." No signs of aphasia or neglect Cranial Nerves: II: Visual Fields are full. Pupils are equal, round, and reactive to light.   III,IV, VI: EOMI without ptosis or diploplia.  V: Facial sensation is symmetric to temperature VII: Facial movement is symmetric.  VIII: hearing is intact to voice X: Uvula elevates symmetrically XI: Shoulder shrug is symmetric. XII: tongue is midline without atrophy or fasciculations.  Motor: Tone is normal. Bulk is normal. 5/5 strength was present in bilaterla arms.  He has good strength in his right leg with the exception of knee extension, which I am not sure if is effort dependent.   Hip Flexion   4 -/5 Hip adduction   4/5 Hip AB duction  4/5 Knee Flexion   0/5 Knee Extension  0/5 Ankle Dorsiflexion  0/5 Ankle Plantarflexion  0/5       Sensory: He endorses sensation wherever I am touching him, but states that it is tingling at times. Deep Tendon Reflexes: 2+ and symmetric in the patella and ankle on the right, on the left they are absent Plantars: Toes are downgoing bilaterally.  Cerebellar: No clear ataxia      I have reviewed labs in epic and the results pertinent to this consultation are: CK greater than  50,000 on 1/16   I have reviewed the images obtained: CT head, spine-negative  Impression: 46 year old male with left leg weakness in the setting of rhabdomyolysis, recent cardiac arrest, multifactorial encephalopathy.  His exam is most consistent with peripheral etiology, in the sciatic distribution I feels almost certainly due to sciatic involvement in the gluteal region.  The femoral involvement is less understandable, and it is possible he has two separate processes.  It is also possible that the weakness is entirely due to rhabdomyolysis, but I feel this is less likely.  Recommendations: 1) CT pelvis and thigh 2)  consider surgical evaluation for possible compartment syndrome 3) agree with rechecking CK. 4) if no other causes found, could consider MRI of the sacral plexus.  Ritta Slot, MD [facility] [phone]  If 7pm- 7am, please page neurology on call as listed in AMION.

## 2021-05-30 NOTE — Progress Notes (Addendum)
NAME:  Andres Braun, MRN:  [id number], DOB:  October 13, 1975, LOS: 6 ADMISSION DATE:  05/24/2021, CONSULTATION DATE:  05/30/21 REFERRING MD:  EDP, CHIEF COMPLAINT:  Cardiac arrest   History of Present Illness:  46 year old man admitted after cardiac arrest at home. PEA arrest, s/p CPR x 20 minutes and Epi x 5. Patient reportedly out drinking with his friends the night prior to admission.  Found by friends with agonal breathing.  Lost pulse. Improved neurologic status, extubated 1/19. Remains encephalopathic.  Pertinent Medical History:  Unknown  Significant Hospital Events: Including procedures, antibiotic start and stop dates in addition to other pertinent events   1/15:  20 minutes of CPR, 5 rounds of epinephrine.  ROSC was obtained.  Rhythm PEA per report.  Emesis noted all around him. On arrival to ED, severely acidemic pH 6.7.  Refractory acidemia.  Intubated. Difficult to ventilate.  Eventually requiring dose of paralytic after adequate sedation.  Labs notable for renal failure, elevated LFTs.  CK in the 14,000 range. Started on ceftriaxone for aspiration PNA, Nephro consulted. Started on CRRT.  CVL and HD cath placed.  1/16: hypotensive overnight. Having Anoxic myoclonus during evening hrs. Treated w/ Versed. Having difficulty w/ HD cath clotting due to Curved ports affecting flow.  EEG suggested diffuse cerebral dysfunction.  There is no epileptiform activity or seizures.  Right IJ dialysis catheter removed due to malfunction, right femoral dialysis catheter placed.  Social work working on family contacts.  Temperature management initiated to avoid fever 1/17: Hemodynamics a little better.  Sedation minimized.  Exam a little improved compared to 1/16 spontaneously moving left upper extremity to stimulus, more forceful cough, opens eyes.  Lower extremity pulses no longer palpable or audible via Doppler. IV heparin stared. Dopplers ordered  1/18: Hemodynamics continue to improve, decreasing  pressor requirements. Improving neuro exam, will weakly follow commands when asked in Spanish. Readily opens eyes to voice. BLE pedal pulses now +doppler. Cortrak placed. 1/19: Improved residuals s/p Cortrak, though emesis on placement. Emesis overnight. Minimal secretions. Mild agitation/gagging, waking up/intermittently following commands. Off of pressors. Extubated to [place]. 1/20: Awake, stable respiratory status on 4L [place]. Remains encephalopathic, oriented only to self/general location. Hypertensive, off of pressors. CRRT stopped, plan for HD.  Interim History / Subjective:   Patient complaining of abdominal pain and bilateral leg pain with inability to move his legs. Code stroke called for the lower extremity weakness. Neurology evaluation was concerning for peripheral etiology.  Imaging overnight showed pneumomediastinum, bilateral lower lobe airspace opacities, abnormal bowel wall edema with surrounding inflammation involving the ileum an ascending colon. Small focus of gas within the wall of the small bowel.   Patient denies nausea.  Repeat CK level is pending.  Objective:  Blood pressure (!) 163/88, pulse (!) 108, temperature (!) 97.5 F (36.4 C), temperature source Axillary, resp. rate (!) 26, height 6' (1.829 m), weight 86.6 kg, SpO2 (!) 89 %. CVP:  [9 mmHg-48 mmHg] 48 mmHg      Intake/Output Summary (Last 24 hours) at 05/30/2021 0739 Last data filed at 05/29/2021 1831 Gross per 24 hour  Intake 399.12 ml  Output 1 ml  Net 398.12 ml   Filed Weights   05/27/21 0330 05/28/21 0459 05/29/21 0341  Weight: 93.5 kg 89.9 kg 86.6 kg   Physical Examination: General: Acutely ill-appearing middle-aged man, mild distress HEENT: [place]/AT, anicteric sclera, PERRL, moist mucous membranes. Neuro: Awake, oriented x 1-2. Drowsy. Responds to verbal stimuli. Diminished movement of lower extremities. CV: Tachycardic, regular  rhythm, no m/g/r. PULM: Breathing even and unlabored. Lung fields CTAB in  upper fields, diminished at bases. GI: Soft, diffusely tender, distended. Active bowel sounds. Extremities: Bilateral symmetric 1+ LE edema noted. Skin: Warm/dry, no rashes. R anterior shin with ecchymosis/blister 2/2 prior IO.  Resolved Hospital Problem List:   Cold lower extremities with no palpable pulses, improved Suspect at least to some extent due to shock state. Improved 1/18 with bilateral DP/PT pulses present with doppler. Mixed metabolic and respiratory acidosis, improving Acid-base improving with CRRT.  Assessment & Plan:  Cardiac arrest Circulatory shock, improving Reported PEA arrest. 20 minutes of CPR, 5 doses of epi.  Emesis noted all over patient.  Suspect respiratory event, pneumonia, aspiration likely. - Continue telemetry - Supportive care -Off of all pressors since 1/18PM  Acute Hypoxemic Respiratory Failure Pneumomediastinum Non-displaced fractures involving costochrondral cartilage of anterior right fifth and sixth ribs Due to aspiration pneumonia and subsequent cardiac arrest. - Continue supplemental O2 support, tolerating [place] s/p extubation - Wean O2 for sat > 90% - Pulmonary hygiene, incentive spirometer - Follow CXR - S/p Ceftriaxone x 5 days (end [id number])  Acute metabolic encephalopathy favoring postarrest anoxic encephalopathy. Initial CT brain negative, downtime in excess of 20 minutes, EEG suggesting fairly significant neurological dysfunction however exam today a little improved however overall still concerned about functional recovery - Minimize sedating medications as able - Communication with patient should be in Spanish (with interpreter) if at all possible  Ischemic Enteritis vs colitis Gastric ileus with high gastric output Cortrak placed 1/18, self-discontinued 1/19. Improved residuals with post-pyloric tube location. - Continue bowel regimen - No Reglan in the setting of prolonged Qtc - General surgery consulted for concern of ischemic  colitis - LR started at 127mL/hr - Check lactic acid  Acute renal failure with oliguria Rhabdomyolysis - Nephrology following, appreciate assistance - Off of CRRT as of 1/19PM - transition to iHD today - Trend BMP - Replete electrolytes as indicated - Monitor I&Os, bladder scans Q shift to monitor for renal recovery - Avoid nephrotoxic agents as able - Ensure adequate renal perfusion  Lower extremity Weakness Neurology evaluated - concern for sciatic nerve involvement vs compartment syndrome - CT imaging with no concerning findings for his symptoms - Pending CK level - Will consult Orthopedics for evaluation of compartment syndrome of the lower extremities - Will check MRI of the sacral plexus if no other etiologies noted  Bloody OG output: Possibly traumatic. - PPI BID  Elevated liver enzymes:  T bili not Phos normal.  Suspect due to shock.  Likely related to rhabdomyolysis.   - Trend LFTs to normal, improving  R anterior knee wound - Rt prior IO placement - Appreciate WOC recs  Physical deconditioning - PT/OT/SLP as appropriate - Limited at present in the setting of femoral HD catheter  Best Practice (right click and "Reselect all SmartList Selections" daily)   Diet/type: clear liquids  DVT prophylaxis: prophylactic heparin  GI prophylaxis: PPI Lines: Central line and Dialysis Catheter Foley:  N/A Code Status:  full code Last date of multidisciplinary goals of care discussion: N/A. We have a contact number of a spouse - apparently separated from this woman; however, also has a cousin.  None of which who speak [place].  We will continue to try to reach out via interpreter.  Critical care time: 40 minutes    Freda Jackson, MD [facility] Office: [phone]   See Amion for personal pager PCCM on call pager [phone]  until 7pm. Please call [facility]ink 7p-7a. [phone]

## 2021-05-30 NOTE — Consult Note (Addendum)
Reason for Consult: Questionable ischemic bowel Referring Physician: Dr. Para Skeans Andres Braun is an 46 y.o. male.  HPI: Patient is a 46 year old male who came in status post found down in CPR.  Per the chart patient was intubated and sedated.  He apparently required 5 rounds of epi.  Patient did have return of vital signs.  This is a time patient was weaned off the ventilator.  He is required HD.  Appear to have rhabdomyolysis.  Patient appeared to be taking some liquids well.  Patient did have 3 bowel movements on day prior to consult.  Per nursing these were regular without any blood.  Patient with some history of distended abdomen, abdominal pain.  Patient mainly complains to me today of left lower leg pain.  He states it feels as if it is asleep/tingling.  Patient does have good PT and DP pulses bilaterally.  Left lower extremity is warmer than right.  Patient went CT scan.  There was concern for possible small bowel ischemia secondary to edematous loops of bowel involving the ileum and ascending colon.  I did review the CT scans personally.   History reviewed. No pertinent past medical history.  History reviewed. No pertinent surgical history.  History reviewed. No pertinent family history.  Social History:  has no history on file for tobacco use, alcohol use, and drug use.  Allergies: No Known Allergies  Medications: I have reviewed the patient's current medications.  Results for orders placed or performed during the hospital encounter of 05/24/21 (from the past 48 hour(s))  Glucose, capillary     Status: Abnormal   Collection Time: 05/28/21 11:30 AM  Result Value Ref Range   Glucose-Capillary 119 (H) 70 - 99 mg/dL    Comment: Glucose reference range applies only to samples taken after fasting for at least 8 hours.  Glucose, capillary     Status: Abnormal   Collection Time: 05/28/21  4:08 PM  Result Value Ref Range   Glucose-Capillary 132 (H) 70 - 99 mg/dL    Comment:  Glucose reference range applies only to samples taken after fasting for at least 8 hours.  Renal function panel (daily at 1600)     Status: Abnormal   Collection Time: 05/28/21  4:15 PM  Result Value Ref Range   Sodium 137 135 - 145 mmol/L   Potassium 3.7 3.5 - 5.1 mmol/L   Chloride 97 (L) 98 - 111 mmol/L   CO2 24 22 - 32 mmol/L   Glucose, Bld 131 (H) 70 - 99 mg/dL    Comment: Glucose reference range applies only to samples taken after fasting for at least 8 hours.   BUN 36 (H) 6 - 20 mg/dL   Creatinine, Ser 2.71 (H) 0.61 - 1.24 mg/dL   Calcium 8.4 (L) 8.9 - 10.3 mg/dL   Phosphorus 2.6 2.5 - 4.6 mg/dL   Albumin 1.9 (L) 3.5 - 5.0 g/dL   GFR, Estimated 29 (L) >60 mL/min    Comment: (NOTE) Calculated using the CKD-EPI Creatinine Equation (2021)    Anion gap 16 (H) 5 - 15    Comment: Performed at [facility] [address], [place], [place] [address]  Glucose, capillary     Status: None   Collection Time: 05/28/21  8:04 PM  Result Value Ref Range   Glucose-Capillary 87 70 - 99 mg/dL    Comment: Glucose reference range applies only to samples taken after fasting for at least 8 hours.  Glucose, capillary  Status: Abnormal   Collection Time: 05/28/21 11:42 PM  Result Value Ref Range   Glucose-Capillary 101 (H) 70 - 99 mg/dL    Comment: Glucose reference range applies only to samples taken after fasting for at least 8 hours.  Renal function panel (daily at 0500)     Status: Abnormal   Collection Time: 05/29/21  2:53 AM  Result Value Ref Range   Sodium 137 135 - 145 mmol/L   Potassium 3.8 3.5 - 5.1 mmol/L   Chloride 103 98 - 111 mmol/L   CO2 25 22 - 32 mmol/L   Glucose, Bld 115 (H) 70 - 99 mg/dL    Comment: Glucose reference range applies only to samples taken after fasting for at least 8 hours.   BUN 55 (H) 6 - 20 mg/dL   Creatinine, Ser 4.23 (H) 0.61 - 1.24 mg/dL    Comment: DELTA CHECK NOTED   Calcium 8.0 (L) 8.9 - 10.3 mg/dL   Phosphorus 2.3 (L) 2.5 - 4.6 mg/dL    Albumin 1.9 (L) 3.5 - 5.0 g/dL   GFR, Estimated 17 (L) >60 mL/min    Comment: (NOTE) Calculated using the CKD-EPI Creatinine Equation (2021)    Anion gap 9 5 - 15    Comment: Performed at [facility] [address] [place], [place] [address]  Magnesium     Status: Abnormal   Collection Time: 05/29/21  2:53 AM  Result Value Ref Range   Magnesium 2.9 (H) 1.7 - 2.4 mg/dL    Comment: Performed at [facility] [address], [place], [place] [address]  CBC     Status: Abnormal   Collection Time: 05/29/21  2:53 AM  Result Value Ref Range   WBC 13.3 (H) 4.0 - 10.5 K/uL   RBC 3.42 (L) 4.22 - 5.81 MIL/uL   Hemoglobin 9.5 (L) 13.0 - 17.0 g/dL   HCT 29.3 (L) 39.0 - 52.0 %   MCV 85.7 80.0 - 100.0 fL   MCH 27.8 26.0 - 34.0 pg   MCHC 32.4 30.0 - 36.0 g/dL   RDW 14.3 11.5 - 15.5 %   Platelets 98 (L) 150 - 400 K/uL    Comment: Immature Platelet Fraction may be clinically indicated, consider ordering this additional test [id number] CONSISTENT WITH PREVIOUS RESULT REPEATED TO VERIFY    nRBC 1.3 (H) 0.0 - 0.2 %    Comment: Performed at [facility], [place] [address], [place], [place] [address]  Glucose, capillary     Status: Abnormal   Collection Time: 05/29/21  2:53 AM  Result Value Ref Range   Glucose-Capillary 110 (H) 70 - 99 mg/dL    Comment: Glucose reference range applies only to samples taken after fasting for at least 8 hours.  Glucose, capillary     Status: None   Collection Time: 05/29/21  4:23 AM  Result Value Ref Range   Glucose-Capillary 93 70 - 99 mg/dL    Comment: Glucose reference range applies only to samples taken after fasting for at least 8 hours.  Glucose, capillary     Status: None   Collection Time: 05/29/21  7:55 AM  Result Value Ref Range   Glucose-Capillary 96 70 - 99 mg/dL    Comment: Glucose reference range applies only to samples taken after fasting for at least 8 hours.  Glucose, capillary     Status: None   Collection Time: 05/29/21  11:18 AM  Result Value Ref Range   Glucose-Capillary 89 70 -  99 mg/dL    Comment: Glucose reference range applies only to samples taken after fasting for at least 8 hours.  Hepatitis B surface antibody     Status: None   Collection Time: 05/29/21 12:30 PM  Result Value Ref Range   Hep B S Ab NON REACTIVE NON REACTIVE    Comment: (NOTE) Inconsistent with immunity, less than 10 mIU/mL.  Performed at [facility], [place] [address], [place], [place] [address]   Hepatitis B surface antibody,quantitative     Status: Abnormal   Collection Time: 05/29/21 12:30 PM  Result Value Ref Range   Hepatitis B-Post <3.1 (L) Immunity>9.9 mIU/mL    Comment: (NOTE)  Status of Immunity                     Anti-HBs Level  ------------------                     -------------- Inconsistent with Immunity                   0.0 - 9.9 Consistent with Immunity                          >9.9 Performed At: [facility] [place], [place] [id number] Rush Farmer MD [id number]   Hepatitis B surface antigen     Status: None   Collection Time: 05/29/21  2:17 PM  Result Value Ref Range   Hepatitis B Surface Ag NON REACTIVE NON REACTIVE    Comment: Performed at [facility] [address] [place], [place] [address]  Glucose, capillary     Status: Abnormal   Collection Time: 05/29/21  2:27 PM  Result Value Ref Range   Glucose-Capillary 105 (H) 70 - 99 mg/dL    Comment: Glucose reference range applies only to samples taken after fasting for at least 8 hours.  Glucose, capillary     Status: None   Collection Time: 05/29/21  3:56 PM  Result Value Ref Range   Glucose-Capillary 97 70 - 99 mg/dL    Comment: Glucose reference range applies only to samples taken after fasting for at least 8 hours.  Glucose, capillary     Status: None   Collection Time: 05/29/21  7:41 PM  Result Value Ref Range   Glucose-Capillary 96 70 - 99 mg/dL    Comment: Glucose reference range applies only to  samples taken after fasting for at least 8 hours.  Glucose, capillary     Status: Abnormal   Collection Time: 05/29/21 11:28 PM  Result Value Ref Range   Glucose-Capillary 114 (H) 70 - 99 mg/dL    Comment: Glucose reference range applies only to samples taken after fasting for at least 8 hours.  Renal function panel (daily at 0500)     Status: Abnormal   Collection Time: 05/30/21  3:40 AM  Result Value Ref Range   Sodium 132 (L) 135 - 145 mmol/L   Potassium 3.7 3.5 - 5.1 mmol/L   Chloride 99 98 - 111 mmol/L   CO2 20 (L) 22 - 32 mmol/L   Glucose, Bld 111 (H) 70 - 99 mg/dL    Comment: Glucose reference range applies only to samples taken after fasting for at least 8 hours.   BUN 108 (H) 6 - 20 mg/dL   Creatinine, Ser 6.54 (H) 0.61 - 1.24 mg/dL    Comment: DELTA CHECK NOTED  Calcium 7.7 (L) 8.9 - 10.3 mg/dL   Phosphorus 4.0 2.5 - 4.6 mg/dL   Albumin 1.9 (L) 3.5 - 5.0 g/dL   GFR, Estimated 10 (L) >60 mL/min    Comment: (NOTE) Calculated using the CKD-EPI Creatinine Equation (2021)    Anion gap 13 5 - 15    Comment: Performed at [facility] [address], [place], [place] [address]  Magnesium     Status: Abnormal   Collection Time: 05/30/21  3:40 AM  Result Value Ref Range   Magnesium 3.3 (H) 1.7 - 2.4 mg/dL    Comment: Performed at [facility] [address] [place], [place] [address]  CBC     Status: Abnormal   Collection Time: 05/30/21  3:40 AM  Result Value Ref Range   WBC 16.7 (H) 4.0 - 10.5 K/uL   RBC 3.52 (L) 4.22 - 5.81 MIL/uL   Hemoglobin 9.9 (L) 13.0 - 17.0 g/dL   HCT 29.4 (L) 39.0 - 52.0 %   MCV 83.5 80.0 - 100.0 fL   MCH 28.1 26.0 - 34.0 pg   MCHC 33.7 30.0 - 36.0 g/dL   RDW 14.2 11.5 - 15.5 %   Platelets 145 (L) 150 - 400 K/uL   nRBC 0.2 0.0 - 0.2 %    Comment: Performed at [facility], [place] [address], [place], [place] [address]  Glucose, capillary     Status: None   Collection Time: 05/30/21  3:41 AM  Result Value Ref Range    Glucose-Capillary 89 70 - 99 mg/dL    Comment: Glucose reference range applies only to samples taken after fasting for at least 8 hours.  Glucose, capillary     Status: None   Collection Time: 05/30/21  7:48 AM  Result Value Ref Range   Glucose-Capillary 92 70 - 99 mg/dL    Comment: Glucose reference range applies only to samples taken after fasting for at least 8 hours.    CT ABDOMEN PELVIS WO CONTRAST  Result Date: 05/30/2021 CLINICAL DATA:  Abdominal pain. EXAM: CT ABDOMEN AND PELVIS WITHOUT CONTRAST TECHNIQUE: Multidetector CT imaging of the abdomen and pelvis was performed following the standard protocol without IV contrast. RADIATION DOSE REDUCTION: This exam was performed according to the departmental dose-optimization program which includes automated exposure control, adjustment of the mA and/or kV according to patient size and/or use of iterative reconstruction technique. COMPARISON:  None FINDINGS: Lower chest: Signs of pneumomediastinum identified. Gas is seen tracking into bilateral cardio phrenic angles. There is also an subcutaneous gas identified along the ventral chest and ventral abdominal wall. Bilateral lower lobe airspace opacities are identified. There also scattered nodular densities noted within the lingula and right middle lobe. Hepatobiliary: No focal liver abnormality identified. Gallbladder appears unremarkable. Pancreas: Unremarkable. No pancreatic ductal dilatation or surrounding inflammatory changes. Spleen: Normal in size without focal abnormality. Adrenals/Urinary Tract: Normal adrenal glands. No nephrolithiasis, there is a punctate stone within the inferior pole of the left kidney. No hydronephrosis or mass identified bilaterally. Urinary bladder contains a small amount of gas. Stomach/Bowel: The stomach appears normal. There is abnormal bowel wall edema with surrounding inflammation involving the ileum and ascending colon tiny focus of gas noted within the wall of the  edematous loop of bowel, image 61/6. There is diffuse surrounding fat stranding and a small volume of free fluid which extends into the pelvis. Mild generalized increase caliber of the small bowel loops proximal to the ileum noted which measure  up to 2.9 cm. Vascular/Lymphatic: Normal appearance of the abdominal aorta. Within the limitations of unenhanced technique no signs of portal venous gas identified at this time no abdominopelvic adenopathy. Reproductive: Prostate is unremarkable. Other: Trace perihepatic ascites noted. There is also a small volume of free fluid noted within the pelvis. No discrete fluid collections identified. Musculoskeletal: No acute or significant osseous findings. There are nondisplaced fractures involving the costochondral cartilage of the anterior right fifth and sixth ribs which may is likely related to recent CPR. IMPRESSION: 1. Examination is positive for pneumomediastinum with subcutaneous gas identified along the ventral chest and ventral abdominal wall. 2. Bilateral lower lobe airspace opacities with scattered nodular densities noted within the lingula and right middle lobe. Findings are favored to represent multifocal pneumonia. 3. Abnormal bowel wall edema with surrounding inflammation involving the ileum and ascending colon. A tiny focus of gas noted within the wall of the edematous loop of bowel is concerning for small bowel ischemia. In the setting of recent cardiac arrest findings may reflect ischemic enteritis and colitis. 4. Mild generalized increase caliber of the small bowel loops proximal to the ileum which measure up to 2.9 cm. No signs of high-grade bowel obstruction. 5. Small volume of free fluid noted within the abdomen and pelvis. No discrete fluid collections identified to suggest abscess. 6. Nondisplaced fractures involving the costochondral cartilage of the anterior right fifth and sixth ribs which may is likely related to recent CPR. Electronically Signed   By:  Kerby Moors M.D.   On: 05/30/2021 06:43   DG Abd 1 View  Result Date: 05/28/2021 CLINICAL DATA:  Ileus. History of endotracheal tube. Cardiac arrest. EXAM: ABDOMEN - 1 VIEW COMPARISON:  Abdominal radiograph 05/27/2021 FINDINGS: A weighted tip endotracheal tube again extends through the stomach and likely into the proximal duodenum with the tip overlying the junction at the duodenal jejunal junction. There again is likely an additional nasogastric tube with tip within the more proximal stomach. There is again contrast material seen overlying the proximal stomach similar to prior. Right femoral approach central venous catheter tip again overlies the right L5 vertebral body. No dilated loops of bowel identified. IMPRESSION: Feeding tube with tip at the duodenal jejunal junction. Partial visualization of nasogastric tube with tip overlying the proximal stomach. Electronically Signed   By: Yvonne Kendall M.D.   On: 05/28/2021 11:51   CT THORACIC SPINE WO CONTRAST  Result Date: 05/30/2021 CLINICAL DATA:  46 year old male with a history of cardiac arrest. Possible change in lower extremity movement/sensation. EXAM: CT THORACIC AND LUMBAR SPINE WITHOUT CONTRAST TECHNIQUE: Multidetector CT imaging of the thoracic and lumbar spine was performed without contrast. Multiplanar CT image reconstructions were also generated. RADIATION DOSE REDUCTION: This exam was performed according to the departmental dose-optimization program which includes automated exposure control, adjustment of the mA and/or kV according to patient size and/or use of iterative reconstruction technique. COMPARISON:  Previous chest and abdominal radiographs. FINDINGS: CT THORACIC SPINE FINDINGS Limited cervical spine imaging: Cervicothoracic junction alignment is within normal limits. Thoracic spine segmentation: Normal, assuming unfused transverse process ossification centers at L1. Alignment:  Preserved thoracic kyphosis. No spondylolisthesis.  Vertebrae: Bone mineralization is within normal limits. Thoracic vertebrae appear intact. No acute osseous abnormality identified. Paraspinal and other soft tissues: Scattered, but confluent peribronchial opacity in the right upper lobe. Right greater than left streaky and confluent bilateral lower lobe opacity. Trace if any associated pleural fluid. Visible major airways are patent. Calcified postinflammatory mediastinal and left hilar lymph  nodes. No evidence of pericardial effusion. Negative visible noncontrast upper abdominal viscera. Thoracic paraspinal soft tissues appear normal. Disc levels: Mild for age thoracic spine degeneration. There is lower thoracic posterior element hypertrophy, including calcified ligament flavum hypertrophy bilaterally at T11-T12. However, no CT evidence of thoracic disc herniation or thoracic spinal stenosis. CT LUMBAR SPINE FINDINGS Segmentation: Normal, with bilateral L1 unfused transverse process ossification centers (normal variant). Alignment: Preserved lumbar lordosis. Vertebrae: No acute osseous abnormality identified. Bone mineralization within normal limits. Visible sacrum and SI joints appear intact. Paraspinal and other soft tissues: Moderate volume of pelvic free fluid, simple fluid density. There is a right lower extremity approach central venous catheter in place, terminates at the junction of the right internal and external iliac veins. Punctate nephrolithiasis. Partially visible probable ingested tooth within a small bowel loop in the right abdomen best seen on coronal image 10. No dilated bowel identified. Otherwise negative visible noncontrast abdominal viscera. Lumbar paraspinal soft tissues appear negative. Disc levels: Mild for age lumbar spine degeneration. Lower lumbar disc bulging maximal at L4-L5. But only mild if any associated lumbar spinal stenosis. IMPRESSION: 1. No acute osseous abnormality in the thoracic or lumbar spine. Mild for age thoracic and  lumbar spine degeneration. No CT evidence of thoracic spinal stenosis. And only mild if any lumbar spinal stenosis related to disc bulging at L4-L5. 2. Right Upper Lobe Bronchopneumonia. Confluent lower lobe opacity could reflect lung collapse versus additional lower lobe pneumonia. Trace if any associated pleural fluid. 3. Moderate volume of pelvic free fluid, nonspecific although with simple fluid density favoring a transudate. 4. Ingested Tooth suspected in a right abdominal small bowel loop (series 5, image 10 of the lumbar study). 5. Nephrolithiasis. Electronically Signed   By: Genevie Ann M.D.   On: 05/30/2021 05:19   CT LUMBAR SPINE WO CONTRAST  Result Date: 05/30/2021 CLINICAL DATA:  46 year old male with a history of cardiac arrest. Possible change in lower extremity movement/sensation. EXAM: CT THORACIC AND LUMBAR SPINE WITHOUT CONTRAST TECHNIQUE: Multidetector CT imaging of the thoracic and lumbar spine was performed without contrast. Multiplanar CT image reconstructions were also generated. RADIATION DOSE REDUCTION: This exam was performed according to the departmental dose-optimization program which includes automated exposure control, adjustment of the mA and/or kV according to patient size and/or use of iterative reconstruction technique. COMPARISON:  Previous chest and abdominal radiographs. FINDINGS: CT THORACIC SPINE FINDINGS Limited cervical spine imaging: Cervicothoracic junction alignment is within normal limits. Thoracic spine segmentation: Normal, assuming unfused transverse process ossification centers at L1. Alignment:  Preserved thoracic kyphosis. No spondylolisthesis. Vertebrae: Bone mineralization is within normal limits. Thoracic vertebrae appear intact. No acute osseous abnormality identified. Paraspinal and other soft tissues: Scattered, but confluent peribronchial opacity in the right upper lobe. Right greater than left streaky and confluent bilateral lower lobe opacity. Trace if any  associated pleural fluid. Visible major airways are patent. Calcified postinflammatory mediastinal and left hilar lymph nodes. No evidence of pericardial effusion. Negative visible noncontrast upper abdominal viscera. Thoracic paraspinal soft tissues appear normal. Disc levels: Mild for age thoracic spine degeneration. There is lower thoracic posterior element hypertrophy, including calcified ligament flavum hypertrophy bilaterally at T11-T12. However, no CT evidence of thoracic disc herniation or thoracic spinal stenosis. CT LUMBAR SPINE FINDINGS Segmentation: Normal, with bilateral L1 unfused transverse process ossification centers (normal variant). Alignment: Preserved lumbar lordosis. Vertebrae: No acute osseous abnormality identified. Bone mineralization within normal limits. Visible sacrum and SI joints appear intact. Paraspinal and other soft tissues: Moderate volume  of pelvic free fluid, simple fluid density. There is a right lower extremity approach central venous catheter in place, terminates at the junction of the right internal and external iliac veins. Punctate nephrolithiasis. Partially visible probable ingested tooth within a small bowel loop in the right abdomen best seen on coronal image 10. No dilated bowel identified. Otherwise negative visible noncontrast abdominal viscera. Lumbar paraspinal soft tissues appear negative. Disc levels: Mild for age lumbar spine degeneration. Lower lumbar disc bulging maximal at L4-L5. But only mild if any associated lumbar spinal stenosis. IMPRESSION: 1. No acute osseous abnormality in the thoracic or lumbar spine. Mild for age thoracic and lumbar spine degeneration. No CT evidence of thoracic spinal stenosis. And only mild if any lumbar spinal stenosis related to disc bulging at L4-L5. 2. Right Upper Lobe Bronchopneumonia. Confluent lower lobe opacity could reflect lung collapse versus additional lower lobe pneumonia. Trace if any associated pleural fluid. 3.  Moderate volume of pelvic free fluid, nonspecific although with simple fluid density favoring a transudate. 4. Ingested Tooth suspected in a right abdominal small bowel loop (series 5, image 10 of the lumbar study). 5. Nephrolithiasis. Electronically Signed   By: Genevie Ann M.D.   On: 05/30/2021 05:19   CT HEAD CODE STROKE WO CONTRAST  Result Date: 05/30/2021 CLINICAL DATA:  46 year old male with a history of cardiac arrest. Possible lower extremity weakness or change in sensation, but moving all extremities in the CT department, code stroke status canceled. EXAM: CT HEAD WITHOUT CONTRAST TECHNIQUE: Contiguous axial images were obtained from the base of the skull through the vertex without intravenous contrast. RADIATION DOSE REDUCTION: This exam was performed according to the departmental dose-optimization program which includes automated exposure control, adjustment of the mA and/or kV according to patient size and/or use of iterative reconstruction technique. COMPARISON:  Head CT 03/24/2022. FINDINGS: Brain: Normal cerebral volume. No midline shift, ventriculomegaly, mass effect, evidence of mass lesion, intracranial hemorrhage or evidence of cortically based acute infarction. Gray-white matter differentiation is within normal limits throughout the brain. Mega cisterna magna, normal variant. Vascular: No suspicious intracranial vascular hyperdensity. Skull: Stable, intact. Sinuses/Orbits: Extubated now. Moderate posterior paranasal sinus mucosal thickening and bubbly opacity persists. Tympanic cavities and mastoids are clear. Other: Trace retained secretions in the visible pharynx. Negative orbit and scalp soft tissues. ASPECTS [facility] Stroke Program Early CT Score) Total score (0-10 with 10 being normal): 10 IMPRESSION: 1. Stable and normal noncontrast CT appearance of the brain. 2. Paranasal sinus opacification following intubation. Electronically Signed   By: Genevie Ann M.D.   On: 05/30/2021 04:54    Review  of Systems  Constitutional:  Negative for chills and fever.  HENT:  Negative for ear discharge, hearing loss and sore throat.   Eyes:  Negative for discharge.  Respiratory:  Negative for cough and shortness of breath.   Cardiovascular:  Negative for chest pain and leg swelling.  Gastrointestinal:  Positive for abdominal distention. Negative for abdominal pain, constipation, diarrhea, nausea and vomiting.  Musculoskeletal:  Negative for myalgias and neck pain.  Skin:  Negative for rash.  Allergic/Immunologic: Negative for environmental allergies.  Neurological:  Negative for dizziness and seizures.  Hematological:  Does not bruise/bleed easily.  Psychiatric/Behavioral:  Negative for suicidal ideas.   All other systems reviewed and are negative. Blood pressure (!) 163/88, pulse (!) 108, temperature (!) 97.5 F (36.4 C), temperature source Axillary, resp. rate (!) 26, height 6' (1.829 m), weight 86.6 kg, SpO2 (!) 89 %. Physical Exam Constitutional:  Appearance: He is well-developed.     Comments: Conversant No acute distress  HENT:     Head: Normocephalic and atraumatic.  Eyes:     General: Lids are normal. No scleral icterus.    Pupils: Pupils are equal, round, and reactive to light.     Comments: Pupils are equal round and reactive No lid lag Moist conjunctiva  Neck:     Thyroid: No thyromegaly.     Trachea: No tracheal tenderness.     Comments: No cervical lymphadenopathy Cardiovascular:     Rate and Rhythm: Normal rate and regular rhythm.     Heart sounds: No murmur heard. Pulmonary:     Effort: Pulmonary effort is normal.     Breath sounds: Normal breath sounds. No wheezing or rales.  Abdominal:     General: There is distension.     Tenderness: There is abdominal tenderness (min ttp x4). There is no guarding or rebound.     Hernia: A hernia is present. Hernia is present in the umbilical area (reducible).  Musculoskeletal:     Cervical back: Normal range of motion and  neck supple.  Skin:    General: Skin is warm.     Findings: No rash.     Nails: There is no clubbing.     Comments: Normal skin turgor  Neurological:     Mental Status: He is alert and oriented to person, place, and time.     Comments: Normal gait and station  Psychiatric:        Mood and Affect: Mood normal.        Thought Content: Thought content normal.        Judgment: Judgment normal.     Comments: Appropriate affect    Assessment/Plan: 46 year old male with questionable small bowel ischemia. Cardiac arrest Acute metabolic encephalopathy Acute renal failure secondary to rhabdomyolysis   At this time the patient appears to have ileus likely secondary to his cardiac arrest. Patient having normal bowel function.  Patient denies any nausea or vomiting.  Patient does have some pain however it does not appear out of proportion to exam.  Will continue to monitor, follow-up lactic acid. Okay for liquids at this point.  No need at this time for emergent surgery   High medical decision making  Ralene Ok 05/30/2021, 8:47 AM

## 2021-05-30 NOTE — Progress Notes (Signed)
[facility] Progress Note   Subjective:   I/Os yesterday 400 / 0.  Failed lasix challenge.   Remains extubated and hemodynamically stable.  Objective Vitals:   05/30/21 0630 05/30/21 0700 05/30/21 0749 05/30/21 0800  BP: (!) 163/88 (!) 167/102  134/78  Pulse: (!) 108 (!) 111  (!) 108  Resp: (!) 26 (!) 29  (!) 23  Temp:   (!) 97.5 F (36.4 C)   TempSrc:   Axillary   SpO2: (!) 89% 93%  95%  Weight:      Height:       Physical Exam General: awake and alert Heart: RRR, no rub Lungs: coarse BL Abdomen: soft, mildly distended Extremities: trace edema, feet less mottled, calves warm, R knee dressing in place Neuro:  awake and alert, oriented to self and [place], cannot wiggle L toes or dorsiflex, R can move toes Dialysis Access:  RIJ fem HD catheter   Additional Objective Labs: Basic Metabolic Panel: Recent Labs  Lab 05/28/21 1615 05/29/21 0253 05/30/21 0340  NA 137 137 132*  K 3.7 3.8 3.7  CL 97* 103 99  CO2 24 25 20*  GLUCOSE 131* 115* 111*  BUN 36* 55* 108*  CREATININE 2.71* 4.23* 6.54*  CALCIUM 8.4* 8.0* 7.7*  PHOS 2.6 2.3* 4.0    Liver Function Tests: Recent Labs  Lab 05/24/21 1406 05/25/21 0416 05/26/21 0916 05/26/21 1600 05/28/21 0347 05/28/21 1615 05/29/21 0253 05/30/21 0340  AST 2,052*  --  3,852*  --  1,193*  --   --   --   ALT 1,832*  --  1,756*  --  896*  --   --   --   ALKPHOS 103  --  86  --  110  --   --   --   BILITOT 0.4  --  0.7  --  0.6  --   --   --   PROT 4.4*  --  5.0*  --  5.6*  --   --   --   ALBUMIN 2.4*   < > 2.2*   < > 2.0* 1.9* 1.9* 1.9*   < > = values in this interval not displayed.    No results for input(s): LIPASE, AMYLASE in the last 168 hours. CBC: Recent Labs  Lab 05/24/21 0916 05/24/21 0931 05/27/21 0931 05/27/21 1535 05/28/21 0347 05/29/21 0253 05/30/21 0340  WBC 9.6   < > 14.9* 13.2* 11.9* 13.3* 16.7*  NEUTROABS 5.2  --   --   --   --   --   --   HGB 15.9   < > 10.5* 10.5* 10.5* 9.5* 9.9*  HCT  53.1*   < > 33.3* 32.0* 31.1* 29.3* 29.4*  MCV 95.5   < > 87.4 87.0 85.9 85.7 83.5  PLT 256   < > 103* 95* 93* 98* 145*   < > = values in this interval not displayed.    Blood Culture No results found for: SDES, SPECREQUEST, CULT, REPTSTATUS  Cardiac Enzymes: Recent Labs  Lab 05/24/21 0932 05/25/21 0416 05/30/21 0340  CKTOTAL 14,071* >50,000* 13,372*    CBG: Recent Labs  Lab 05/29/21 1556 05/29/21 1941 05/29/21 2328 05/30/21 0341 05/30/21 0748  GLUCAP 97 96 114* 89 92    Iron Studies: No results for input(s): IRON, TIBC, TRANSFERRIN, FERRITIN in the last 72 hours. @lablastinr3 @ Studies/Results: CT ABDOMEN PELVIS WO CONTRAST  Result Date: 05/30/2021 CLINICAL DATA:  Abdominal pain. EXAM: CT ABDOMEN AND PELVIS WITHOUT CONTRAST TECHNIQUE: Multidetector  CT imaging of the abdomen and pelvis was performed following the standard protocol without IV contrast. RADIATION DOSE REDUCTION: This exam was performed according to the departmental dose-optimization program which includes automated exposure control, adjustment of the mA and/or kV according to patient size and/or use of iterative reconstruction technique. COMPARISON:  None FINDINGS: Lower chest: Signs of pneumomediastinum identified. Gas is seen tracking into bilateral cardio phrenic angles. There is also an subcutaneous gas identified along the ventral chest and ventral abdominal wall. Bilateral lower lobe airspace opacities are identified. There also scattered nodular densities noted within the lingula and right middle lobe. Hepatobiliary: No focal liver abnormality identified. Gallbladder appears unremarkable. Pancreas: Unremarkable. No pancreatic ductal dilatation or surrounding inflammatory changes. Spleen: Normal in size without focal abnormality. Adrenals/Urinary Tract: Normal adrenal glands. No nephrolithiasis, there is a punctate stone within the inferior pole of the left kidney. No hydronephrosis or mass identified  bilaterally. Urinary bladder contains a small amount of gas. Stomach/Bowel: The stomach appears normal. There is abnormal bowel wall edema with surrounding inflammation involving the ileum and ascending colon tiny focus of gas noted within the wall of the edematous loop of bowel, image 61/6. There is diffuse surrounding fat stranding and a small volume of free fluid which extends into the pelvis. Mild generalized increase caliber of the small bowel loops proximal to the ileum noted which measure up to 2.9 cm. Vascular/Lymphatic: Normal appearance of the abdominal aorta. Within the limitations of unenhanced technique no signs of portal venous gas identified at this time no abdominopelvic adenopathy. Reproductive: Prostate is unremarkable. Other: Trace perihepatic ascites noted. There is also a small volume of free fluid noted within the pelvis. No discrete fluid collections identified. Musculoskeletal: No acute or significant osseous findings. There are nondisplaced fractures involving the costochondral cartilage of the anterior right fifth and sixth ribs which may is likely related to recent CPR. IMPRESSION: 1. Examination is positive for pneumomediastinum with subcutaneous gas identified along the ventral chest and ventral abdominal wall. 2. Bilateral lower lobe airspace opacities with scattered nodular densities noted within the lingula and right middle lobe. Findings are favored to represent multifocal pneumonia. 3. Abnormal bowel wall edema with surrounding inflammation involving the ileum and ascending colon. A tiny focus of gas noted within the wall of the edematous loop of bowel is concerning for small bowel ischemia. In the setting of recent cardiac arrest findings may reflect ischemic enteritis and colitis. 4. Mild generalized increase caliber of the small bowel loops proximal to the ileum which measure up to 2.9 cm. No signs of high-grade bowel obstruction. 5. Small volume of free fluid noted within the  abdomen and pelvis. No discrete fluid collections identified to suggest abscess. 6. Nondisplaced fractures involving the costochondral cartilage of the anterior right fifth and sixth ribs which may is likely related to recent CPR. Electronically Signed   By: Kerby Moors M.D.   On: 05/30/2021 06:43   CT THORACIC SPINE WO CONTRAST  Result Date: 05/30/2021 CLINICAL DATA:  46 year old male with a history of cardiac arrest. Possible change in lower extremity movement/sensation. EXAM: CT THORACIC AND LUMBAR SPINE WITHOUT CONTRAST TECHNIQUE: Multidetector CT imaging of the thoracic and lumbar spine was performed without contrast. Multiplanar CT image reconstructions were also generated. RADIATION DOSE REDUCTION: This exam was performed according to the departmental dose-optimization program which includes automated exposure control, adjustment of the mA and/or kV according to patient size and/or use of iterative reconstruction technique. COMPARISON:  Previous chest and abdominal radiographs. FINDINGS: CT  THORACIC SPINE FINDINGS Limited cervical spine imaging: Cervicothoracic junction alignment is within normal limits. Thoracic spine segmentation: Normal, assuming unfused transverse process ossification centers at L1. Alignment:  Preserved thoracic kyphosis. No spondylolisthesis. Vertebrae: Bone mineralization is within normal limits. Thoracic vertebrae appear intact. No acute osseous abnormality identified. Paraspinal and other soft tissues: Scattered, but confluent peribronchial opacity in the right upper lobe. Right greater than left streaky and confluent bilateral lower lobe opacity. Trace if any associated pleural fluid. Visible major airways are patent. Calcified postinflammatory mediastinal and left hilar lymph nodes. No evidence of pericardial effusion. Negative visible noncontrast upper abdominal viscera. Thoracic paraspinal soft tissues appear normal. Disc levels: Mild for age thoracic spine degeneration.  There is lower thoracic posterior element hypertrophy, including calcified ligament flavum hypertrophy bilaterally at T11-T12. However, no CT evidence of thoracic disc herniation or thoracic spinal stenosis. CT LUMBAR SPINE FINDINGS Segmentation: Normal, with bilateral L1 unfused transverse process ossification centers (normal variant). Alignment: Preserved lumbar lordosis. Vertebrae: No acute osseous abnormality identified. Bone mineralization within normal limits. Visible sacrum and SI joints appear intact. Paraspinal and other soft tissues: Moderate volume of pelvic free fluid, simple fluid density. There is a right lower extremity approach central venous catheter in place, terminates at the junction of the right internal and external iliac veins. Punctate nephrolithiasis. Partially visible probable ingested tooth within a small bowel loop in the right abdomen best seen on coronal image 10. No dilated bowel identified. Otherwise negative visible noncontrast abdominal viscera. Lumbar paraspinal soft tissues appear negative. Disc levels: Mild for age lumbar spine degeneration. Lower lumbar disc bulging maximal at L4-L5. But only mild if any associated lumbar spinal stenosis. IMPRESSION: 1. No acute osseous abnormality in the thoracic or lumbar spine. Mild for age thoracic and lumbar spine degeneration. No CT evidence of thoracic spinal stenosis. And only mild if any lumbar spinal stenosis related to disc bulging at L4-L5. 2. Right Upper Lobe Bronchopneumonia. Confluent lower lobe opacity could reflect lung collapse versus additional lower lobe pneumonia. Trace if any associated pleural fluid. 3. Moderate volume of pelvic free fluid, nonspecific although with simple fluid density favoring a transudate. 4. Ingested Tooth suspected in a right abdominal small bowel loop (series 5, image 10 of the lumbar study). 5. Nephrolithiasis. Electronically Signed   By: Genevie Ann M.D.   On: 05/30/2021 05:19   CT LUMBAR SPINE WO  CONTRAST  Result Date: 05/30/2021 CLINICAL DATA:  46 year old male with a history of cardiac arrest. Possible change in lower extremity movement/sensation. EXAM: CT THORACIC AND LUMBAR SPINE WITHOUT CONTRAST TECHNIQUE: Multidetector CT imaging of the thoracic and lumbar spine was performed without contrast. Multiplanar CT image reconstructions were also generated. RADIATION DOSE REDUCTION: This exam was performed according to the departmental dose-optimization program which includes automated exposure control, adjustment of the mA and/or kV according to patient size and/or use of iterative reconstruction technique. COMPARISON:  Previous chest and abdominal radiographs. FINDINGS: CT THORACIC SPINE FINDINGS Limited cervical spine imaging: Cervicothoracic junction alignment is within normal limits. Thoracic spine segmentation: Normal, assuming unfused transverse process ossification centers at L1. Alignment:  Preserved thoracic kyphosis. No spondylolisthesis. Vertebrae: Bone mineralization is within normal limits. Thoracic vertebrae appear intact. No acute osseous abnormality identified. Paraspinal and other soft tissues: Scattered, but confluent peribronchial opacity in the right upper lobe. Right greater than left streaky and confluent bilateral lower lobe opacity. Trace if any associated pleural fluid. Visible major airways are patent. Calcified postinflammatory mediastinal and left hilar lymph nodes. No evidence of pericardial  effusion. Negative visible noncontrast upper abdominal viscera. Thoracic paraspinal soft tissues appear normal. Disc levels: Mild for age thoracic spine degeneration. There is lower thoracic posterior element hypertrophy, including calcified ligament flavum hypertrophy bilaterally at T11-T12. However, no CT evidence of thoracic disc herniation or thoracic spinal stenosis. CT LUMBAR SPINE FINDINGS Segmentation: Normal, with bilateral L1 unfused transverse process ossification centers (normal  variant). Alignment: Preserved lumbar lordosis. Vertebrae: No acute osseous abnormality identified. Bone mineralization within normal limits. Visible sacrum and SI joints appear intact. Paraspinal and other soft tissues: Moderate volume of pelvic free fluid, simple fluid density. There is a right lower extremity approach central venous catheter in place, terminates at the junction of the right internal and external iliac veins. Punctate nephrolithiasis. Partially visible probable ingested tooth within a small bowel loop in the right abdomen best seen on coronal image 10. No dilated bowel identified. Otherwise negative visible noncontrast abdominal viscera. Lumbar paraspinal soft tissues appear negative. Disc levels: Mild for age lumbar spine degeneration. Lower lumbar disc bulging maximal at L4-L5. But only mild if any associated lumbar spinal stenosis. IMPRESSION: 1. No acute osseous abnormality in the thoracic or lumbar spine. Mild for age thoracic and lumbar spine degeneration. No CT evidence of thoracic spinal stenosis. And only mild if any lumbar spinal stenosis related to disc bulging at L4-L5. 2. Right Upper Lobe Bronchopneumonia. Confluent lower lobe opacity could reflect lung collapse versus additional lower lobe pneumonia. Trace if any associated pleural fluid. 3. Moderate volume of pelvic free fluid, nonspecific although with simple fluid density favoring a transudate. 4. Ingested Tooth suspected in a right abdominal small bowel loop (series 5, image 10 of the lumbar study). 5. Nephrolithiasis. Electronically Signed   By: Genevie Ann M.D.   On: 05/30/2021 05:19   DG Chest Port 1 View  Result Date: 05/30/2021 CLINICAL DATA:  History of cardiac arrest. EXAM: PORTABLE CHEST 1 VIEW COMPARISON:  Chest radiograph 05/28/2021 FINDINGS: Monitoring leads overlie the patient. Stable cardiac and mediastinal contours. Interval removal of left IJ central venous catheter and enteric tube as well as extubation.  Persistent pneumomediastinum. Low lung volumes with bibasilar heterogeneous opacities. Redemonstrated lucency within the left supraclavicular fossa. IMPRESSION: Interval removal of enteric tube, ET tube and left IJ catheter. Persistent pneumomediastinum. Redemonstrated lucency left supraclavicular fossa which may represent additional gas tracking from the chest. Low lung volumes with bibasilar opacities favored to represent atelectasis. Electronically Signed   By: Lovey Newcomer M.D.   On: 05/30/2021 10:12   CT HEAD CODE STROKE WO CONTRAST  Result Date: 05/30/2021 CLINICAL DATA:  46 year old male with a history of cardiac arrest. Possible lower extremity weakness or change in sensation, but moving all extremities in the CT department, code stroke status canceled. EXAM: CT HEAD WITHOUT CONTRAST TECHNIQUE: Contiguous axial images were obtained from the base of the skull through the vertex without intravenous contrast. RADIATION DOSE REDUCTION: This exam was performed according to the departmental dose-optimization program which includes automated exposure control, adjustment of the mA and/or kV according to patient size and/or use of iterative reconstruction technique. COMPARISON:  Head CT 03/24/2022. FINDINGS: Brain: Normal cerebral volume. No midline shift, ventriculomegaly, mass effect, evidence of mass lesion, intracranial hemorrhage or evidence of cortically based acute infarction. Gray-white matter differentiation is within normal limits throughout the brain. Mega cisterna magna, normal variant. Vascular: No suspicious intracranial vascular hyperdensity. Skull: Stable, intact. Sinuses/Orbits: Extubated now. Moderate posterior paranasal sinus mucosal thickening and bubbly opacity persists. Tympanic cavities and mastoids are clear. Other: Trace  retained secretions in the visible pharynx. Negative orbit and scalp soft tissues. ASPECTS [facility] Stroke Program Early CT Score) Total score (0-10 with 10 being normal):  10 IMPRESSION: 1. Stable and normal noncontrast CT appearance of the brain. 2. Paranasal sinus opacification following intubation. Electronically Signed   By: Genevie Ann M.D.   On: 05/30/2021 04:54   Medications:  sodium chloride Stopped (05/29/21 1432)   sodium chloride     sodium chloride     sodium chloride     sodium chloride     cefTRIAXone (ROCEPHIN)  IV     lactated ringers 100 mL/hr at 05/30/21 0850   metronidazole 500 mg (05/30/21 0851)    B-complex with vitamin C  1 tablet Per Tube Daily   Chlorhexidine Gluconate Cloth  6 each Topical Q0600   Chlorhexidine Gluconate Cloth  6 each Topical Q0600   docusate  100 mg Per Tube BID   feeding supplement  237 mL Oral BID BM   folic acid  1 mg Per Tube Daily   free water  30 mL Per Tube Q4H   heparin injection (subcutaneous)  5,000 Units Subcutaneous Q8H   insulin aspart  2-6 Units Subcutaneous Q4H   mouth rinse  15 mL Mouth Rinse BID   pantoprazole (PROTONIX) IV  40 mg Intravenous Q12H   polyethylene glycol  17 g Per Tube BID   thiamine  100 mg Per Tube Daily    Assessment/ Plan: AKI severe: in setting of cardiac arrest, rhabdo.  Oliguric.  Required CRRT 1/15 - 1/19.  Anuric despite high dose lasix 1/20.  HD today via femoral HD catheter.  Will need to change catheter to IJ [facility] soon.  Cont supportive care.  Avoid hypotension, nephrotoxins.  SP cardiac arrest - asystolic arrest in setting of prob asp PNA/ ARDS in setting of EtOH Severe metabolic/ resp acidosis -   improved with vent and CRRT.   AHRF - suspected aspiration pneumonitis w/ ARDS, on antibiotics per primary.  No on [place] Hepatitis - shock, EtOH, rhabdo contributors; per primary AMS - improving.   LE neurologic deficits - ortho seeing, imaging  Will continue to follow - call with concerns  Jannifer Hick MD 05/30/2021, 10:15 AM  [facility] Pager: [phone]

## 2021-05-30 NOTE — Code Documentation (Signed)
Responded to Code Stroke called at 0412 for L leg weakness, LSN originally thought to be 0245. This changed after reviewing pt's chart. CBG-89, NIH-6 for BUE drift and LLE weakness. Pt taken to CT head-negative for acute changes. TNK not given-unclear time of onset. Plan musculoskeletal workup for L leg pain.

## 2021-05-30 NOTE — Consult Note (Signed)
ORTHOPAEDIC CONSULTATION  REQUESTING PHYSICIAN: Freddi Starr, MD  Chief Complaint: Bilateral lower extremity numbness and weakness  HPI: Andres Braun is a 46 y.o. male who presents with status post being found down who underwent multiple cycles of CPR.  He now is postextubation and is experiencing encephalopathy although mental status is clear today.  He is stating that both of his legs feel numb from the thighs down and he feels like there is a loss of blood to them.  He is endorsing weakness particularly about the left foot although the right foot remains strong.  He is currently being worked up in the ICU for multiple sources of ischemia.  History reviewed. No pertinent past medical history. History reviewed. No pertinent surgical history. Social History   Socioeconomic History   Marital status: Unknown    Spouse name: Not on file   Number of children: Not on file   Years of education: Not on file   Highest education level: Not on file  Occupational History   Not on file  Tobacco Use   Smoking status: Unknown   Smokeless tobacco: Not on file  Substance and Sexual Activity   Alcohol use: Not on file   Drug use: Not on file   Sexual activity: Not on file  Other Topics Concern   Not on file  Social History Narrative   Not on file   Social Determinants of Health   Financial Resource Strain: Not on file  Food Insecurity: Not on file  Transportation Needs: Not on file  Physical Activity: Not on file  Stress: Not on file  Social Connections: Not on file   History reviewed. No pertinent family history. - negative except otherwise stated in the family history section No Known Allergies Prior to Admission medications   Not on File   CT ABDOMEN PELVIS WO CONTRAST  Result Date: 05/30/2021 CLINICAL DATA:  Abdominal pain. EXAM: CT ABDOMEN AND PELVIS WITHOUT CONTRAST TECHNIQUE: Multidetector CT imaging of the abdomen and pelvis was performed following the standard  protocol without IV contrast. RADIATION DOSE REDUCTION: This exam was performed according to the departmental dose-optimization program which includes automated exposure control, adjustment of the mA and/or kV according to patient size and/or use of iterative reconstruction technique. COMPARISON:  None FINDINGS: Lower chest: Signs of pneumomediastinum identified. Gas is seen tracking into bilateral cardio phrenic angles. There is also an subcutaneous gas identified along the ventral chest and ventral abdominal wall. Bilateral lower lobe airspace opacities are identified. There also scattered nodular densities noted within the lingula and right middle lobe. Hepatobiliary: No focal liver abnormality identified. Gallbladder appears unremarkable. Pancreas: Unremarkable. No pancreatic ductal dilatation or surrounding inflammatory changes. Spleen: Normal in size without focal abnormality. Adrenals/Urinary Tract: Normal adrenal glands. No nephrolithiasis, there is a punctate stone within the inferior pole of the left kidney. No hydronephrosis or mass identified bilaterally. Urinary bladder contains a small amount of gas. Stomach/Bowel: The stomach appears normal. There is abnormal bowel wall edema with surrounding inflammation involving the ileum and ascending colon tiny focus of gas noted within the wall of the edematous loop of bowel, image 61/6. There is diffuse surrounding fat stranding and a small volume of free fluid which extends into the pelvis. Mild generalized increase caliber of the small bowel loops proximal to the ileum noted which measure up to 2.9 cm. Vascular/Lymphatic: Normal appearance of the abdominal aorta. Within the limitations of unenhanced technique no signs of portal venous gas identified at this time  no abdominopelvic adenopathy. Reproductive: Prostate is unremarkable. Other: Trace perihepatic ascites noted. There is also a small volume of free fluid noted within the pelvis. No discrete fluid  collections identified. Musculoskeletal: No acute or significant osseous findings. There are nondisplaced fractures involving the costochondral cartilage of the anterior right fifth and sixth ribs which may is likely related to recent CPR. IMPRESSION: 1. Examination is positive for pneumomediastinum with subcutaneous gas identified along the ventral chest and ventral abdominal wall. 2. Bilateral lower lobe airspace opacities with scattered nodular densities noted within the lingula and right middle lobe. Findings are favored to represent multifocal pneumonia. 3. Abnormal bowel wall edema with surrounding inflammation involving the ileum and ascending colon. A tiny focus of gas noted within the wall of the edematous loop of bowel is concerning for small bowel ischemia. In the setting of recent cardiac arrest findings may reflect ischemic enteritis and colitis. 4. Mild generalized increase caliber of the small bowel loops proximal to the ileum which measure up to 2.9 cm. No signs of high-grade bowel obstruction. 5. Small volume of free fluid noted within the abdomen and pelvis. No discrete fluid collections identified to suggest abscess. 6. Nondisplaced fractures involving the costochondral cartilage of the anterior right fifth and sixth ribs which may is likely related to recent CPR. Electronically Signed   By: Kerby Moors M.D.   On: 05/30/2021 06:43   DG Abd 1 View  Result Date: 05/28/2021 CLINICAL DATA:  Ileus. History of endotracheal tube. Cardiac arrest. EXAM: ABDOMEN - 1 VIEW COMPARISON:  Abdominal radiograph 05/27/2021 FINDINGS: A weighted tip endotracheal tube again extends through the stomach and likely into the proximal duodenum with the tip overlying the junction at the duodenal jejunal junction. There again is likely an additional nasogastric tube with tip within the more proximal stomach. There is again contrast material seen overlying the proximal stomach similar to prior. Right femoral approach  central venous catheter tip again overlies the right L5 vertebral body. No dilated loops of bowel identified. IMPRESSION: Feeding tube with tip at the duodenal jejunal junction. Partial visualization of nasogastric tube with tip overlying the proximal stomach. Electronically Signed   By: Yvonne Kendall M.D.   On: 05/28/2021 11:51   CT THORACIC SPINE WO CONTRAST  Result Date: 05/30/2021 CLINICAL DATA:  46 year old male with a history of cardiac arrest. Possible change in lower extremity movement/sensation. EXAM: CT THORACIC AND LUMBAR SPINE WITHOUT CONTRAST TECHNIQUE: Multidetector CT imaging of the thoracic and lumbar spine was performed without contrast. Multiplanar CT image reconstructions were also generated. RADIATION DOSE REDUCTION: This exam was performed according to the departmental dose-optimization program which includes automated exposure control, adjustment of the mA and/or kV according to patient size and/or use of iterative reconstruction technique. COMPARISON:  Previous chest and abdominal radiographs. FINDINGS: CT THORACIC SPINE FINDINGS Limited cervical spine imaging: Cervicothoracic junction alignment is within normal limits. Thoracic spine segmentation: Normal, assuming unfused transverse process ossification centers at L1. Alignment:  Preserved thoracic kyphosis. No spondylolisthesis. Vertebrae: Bone mineralization is within normal limits. Thoracic vertebrae appear intact. No acute osseous abnormality identified. Paraspinal and other soft tissues: Scattered, but confluent peribronchial opacity in the right upper lobe. Right greater than left streaky and confluent bilateral lower lobe opacity. Trace if any associated pleural fluid. Visible major airways are patent. Calcified postinflammatory mediastinal and left hilar lymph nodes. No evidence of pericardial effusion. Negative visible noncontrast upper abdominal viscera. Thoracic paraspinal soft tissues appear normal. Disc levels: Mild for age  thoracic spine degeneration.  There is lower thoracic posterior element hypertrophy, including calcified ligament flavum hypertrophy bilaterally at T11-T12. However, no CT evidence of thoracic disc herniation or thoracic spinal stenosis. CT LUMBAR SPINE FINDINGS Segmentation: Normal, with bilateral L1 unfused transverse process ossification centers (normal variant). Alignment: Preserved lumbar lordosis. Vertebrae: No acute osseous abnormality identified. Bone mineralization within normal limits. Visible sacrum and SI joints appear intact. Paraspinal and other soft tissues: Moderate volume of pelvic free fluid, simple fluid density. There is a right lower extremity approach central venous catheter in place, terminates at the junction of the right internal and external iliac veins. Punctate nephrolithiasis. Partially visible probable ingested tooth within a small bowel loop in the right abdomen best seen on coronal image 10. No dilated bowel identified. Otherwise negative visible noncontrast abdominal viscera. Lumbar paraspinal soft tissues appear negative. Disc levels: Mild for age lumbar spine degeneration. Lower lumbar disc bulging maximal at L4-L5. But only mild if any associated lumbar spinal stenosis. IMPRESSION: 1. No acute osseous abnormality in the thoracic or lumbar spine. Mild for age thoracic and lumbar spine degeneration. No CT evidence of thoracic spinal stenosis. And only mild if any lumbar spinal stenosis related to disc bulging at L4-L5. 2. Right Upper Lobe Bronchopneumonia. Confluent lower lobe opacity could reflect lung collapse versus additional lower lobe pneumonia. Trace if any associated pleural fluid. 3. Moderate volume of pelvic free fluid, nonspecific although with simple fluid density favoring a transudate. 4. Ingested Tooth suspected in a right abdominal small bowel loop (series 5, image 10 of the lumbar study). 5. Nephrolithiasis. Electronically Signed   By: Genevie Ann M.D.   On: 05/30/2021  05:19   CT LUMBAR SPINE WO CONTRAST  Result Date: 05/30/2021 CLINICAL DATA:  46 year old male with a history of cardiac arrest. Possible change in lower extremity movement/sensation. EXAM: CT THORACIC AND LUMBAR SPINE WITHOUT CONTRAST TECHNIQUE: Multidetector CT imaging of the thoracic and lumbar spine was performed without contrast. Multiplanar CT image reconstructions were also generated. RADIATION DOSE REDUCTION: This exam was performed according to the departmental dose-optimization program which includes automated exposure control, adjustment of the mA and/or kV according to patient size and/or use of iterative reconstruction technique. COMPARISON:  Previous chest and abdominal radiographs. FINDINGS: CT THORACIC SPINE FINDINGS Limited cervical spine imaging: Cervicothoracic junction alignment is within normal limits. Thoracic spine segmentation: Normal, assuming unfused transverse process ossification centers at L1. Alignment:  Preserved thoracic kyphosis. No spondylolisthesis. Vertebrae: Bone mineralization is within normal limits. Thoracic vertebrae appear intact. No acute osseous abnormality identified. Paraspinal and other soft tissues: Scattered, but confluent peribronchial opacity in the right upper lobe. Right greater than left streaky and confluent bilateral lower lobe opacity. Trace if any associated pleural fluid. Visible major airways are patent. Calcified postinflammatory mediastinal and left hilar lymph nodes. No evidence of pericardial effusion. Negative visible noncontrast upper abdominal viscera. Thoracic paraspinal soft tissues appear normal. Disc levels: Mild for age thoracic spine degeneration. There is lower thoracic posterior element hypertrophy, including calcified ligament flavum hypertrophy bilaterally at T11-T12. However, no CT evidence of thoracic disc herniation or thoracic spinal stenosis. CT LUMBAR SPINE FINDINGS Segmentation: Normal, with bilateral L1 unfused transverse process  ossification centers (normal variant). Alignment: Preserved lumbar lordosis. Vertebrae: No acute osseous abnormality identified. Bone mineralization within normal limits. Visible sacrum and SI joints appear intact. Paraspinal and other soft tissues: Moderate volume of pelvic free fluid, simple fluid density. There is a right lower extremity approach central venous catheter in place, terminates at the junction of the right  internal and external iliac veins. Punctate nephrolithiasis. Partially visible probable ingested tooth within a small bowel loop in the right abdomen best seen on coronal image 10. No dilated bowel identified. Otherwise negative visible noncontrast abdominal viscera. Lumbar paraspinal soft tissues appear negative. Disc levels: Mild for age lumbar spine degeneration. Lower lumbar disc bulging maximal at L4-L5. But only mild if any associated lumbar spinal stenosis. IMPRESSION: 1. No acute osseous abnormality in the thoracic or lumbar spine. Mild for age thoracic and lumbar spine degeneration. No CT evidence of thoracic spinal stenosis. And only mild if any lumbar spinal stenosis related to disc bulging at L4-L5. 2. Right Upper Lobe Bronchopneumonia. Confluent lower lobe opacity could reflect lung collapse versus additional lower lobe pneumonia. Trace if any associated pleural fluid. 3. Moderate volume of pelvic free fluid, nonspecific although with simple fluid density favoring a transudate. 4. Ingested Tooth suspected in a right abdominal small bowel loop (series 5, image 10 of the lumbar study). 5. Nephrolithiasis. Electronically Signed   By: Genevie Ann M.D.   On: 05/30/2021 05:19   CT HEAD CODE STROKE WO CONTRAST  Result Date: 05/30/2021 CLINICAL DATA:  46 year old male with a history of cardiac arrest. Possible lower extremity weakness or change in sensation, but moving all extremities in the CT department, code stroke status canceled. EXAM: CT HEAD WITHOUT CONTRAST TECHNIQUE: Contiguous axial  images were obtained from the base of the skull through the vertex without intravenous contrast. RADIATION DOSE REDUCTION: This exam was performed according to the departmental dose-optimization program which includes automated exposure control, adjustment of the mA and/or kV according to patient size and/or use of iterative reconstruction technique. COMPARISON:  Head CT 03/24/2022. FINDINGS: Brain: Normal cerebral volume. No midline shift, ventriculomegaly, mass effect, evidence of mass lesion, intracranial hemorrhage or evidence of cortically based acute infarction. Gray-white matter differentiation is within normal limits throughout the brain. Mega cisterna magna, normal variant. Vascular: No suspicious intracranial vascular hyperdensity. Skull: Stable, intact. Sinuses/Orbits: Extubated now. Moderate posterior paranasal sinus mucosal thickening and bubbly opacity persists. Tympanic cavities and mastoids are clear. Other: Trace retained secretions in the visible pharynx. Negative orbit and scalp soft tissues. ASPECTS [facility] Stroke Program Early CT Score) Total score (0-10 with 10 being normal): 10 IMPRESSION: 1. Stable and normal noncontrast CT appearance of the brain. 2. Paranasal sinus opacification following intubation. Electronically Signed   By: Genevie Ann M.D.   On: 05/30/2021 04:54     Positive ROS: All other systems have been reviewed and were otherwise negative with the exception of those mentioned in the HPI and as above.  Physical Exam: General: No acute distress Cardiovascular: No pedal edema Respiratory: No cyanosis, no use of accessory musculature GI: No organomegaly, abdomen is soft and non-tender Skin: No lesions in the area of chief complaint Lymphatic: No axillary or cervical lymphadenopathy  MUSCULOSKELETAL:  On sensory exam patient states that from the thigh down decree sensation bilateral lower extremities.  All of his compartments of the buttocks thigh and lower legs are  compressible bilaterally.  There is no concern for any type of compartments in bilateral lower extremities.  He does have a pressure wound involving the anterior aspect of the right knee.  This overall appears to be relatively low-grade and is well dressed.  There is no surrounding erythema or drainage.  He lacks knee extension bilateral lower extremities.  He lacks dorsal and plantar flexion of the left foot although he has quite strong dorsal and plantar flexion of the right  foot.  Endorses tibial sensation bilaterally.  Palpable 2+ dorsalis pedis pulse bilaterally  Independent Imaging Review: CT scan thoracic lumbar spine is within normal limits  Assessment: 46 year old male with bilateral lower extremity numbness and weakness following multiple rounds of CPR with epinephrine.  At this time I am not concerned for compartment syndrome involving the buttocks, thigh, lower leg.  I do believe that his lower extremity weakness is most likely associated with an ischemic infarct of the spine or lumbar plexus.  To that effect I would recommend MRI with contrast of the lumbar and thoracic spine and neurology consultation Plan: Given that he does have a foot drop on the left lower extremity I would recommend consultation with therapy for a left-sided AFO  Thank you for the consult and the opportunity to see [name]  Vanetta Mulders, MD [facility] 9:29 AM

## 2021-05-30 NOTE — Progress Notes (Signed)
PCCM Update:  Precedex and PRN fentanyl ordered for patient comfort in order to obtain MRI scans.  Melody Comas, MD [facility] Office: [phone]   See Amion for personal pager PCCM on call pager [phone] until 7pm. Please call Elink 7p-7a. [phone]

## 2021-05-30 NOTE — Progress Notes (Signed)
eLink Physician-Brief Progress Note Patient Name: Andres Braun DOB: [birth year] MRN: [id number]   Date of Service  05/30/2021  HPI/Events of Note  Patient with abdominal distension and pain.  eICU Interventions  CT abdomen / pelvis ordered to r/o retroperitoneal hematoma vs other cause of acute abdomen.        Thomasene Lot Kynadee Dam 05/30/2021, 5:48 AM

## 2021-05-30 NOTE — Progress Notes (Signed)
eLink Physician-Brief Progress Note Patient Name: Andres Braun DOB: February 02, 1976 MRN: [id number]   Date of Service  05/30/2021  HPI/Events of Note  Radiologist called with abnormal CT abdomen / pelvis result involving a small amount of air in the bowel wall in the context of ileitis and ascending colitis, he was concerned about possible bowel ischemia given patient's history of recent cardiac arrest.  eICU Interventions  I spoke with Dr. Erin Fulling, incoming PCCM daytime attending physician for [facility], he will follow up and contact surgery.        Andres Braun 05/30/2021, 7:02 AM

## 2021-05-30 NOTE — Progress Notes (Addendum)
eLink Physician-Brief Progress Note Patient Name: Andres Braun DOB: Nov 30, 1975 MRN: [id number]   Date of Service  05/30/2021  HPI/Events of Note  Patient complaining of numbness and pain in his lower extremities, he seems unable to move them against gravity, he does not speak any english and also appears a bit disoriented,  consequently even his upper extremity exams are not reliably excluding the possibility of weakness.  eICU Interventions  Code stroke called, he is to get CT scans of his thoracic and lumbar spines as well while he is down in CT for the CT head. CK also ordered to exclude rhabdomyolysis or other myopathy causing the weakness.        Thomasene Lot Sarkis Rhines 05/30/2021, 4:18 AM

## 2021-05-31 ENCOUNTER — Inpatient Hospital Stay (HOSPITAL_COMMUNITY): Payer: Self-pay

## 2021-05-31 LAB — CBC
HCT: 25.9 % — ABNORMAL LOW (ref 39.0–52.0)
HCT: 24.2 % — ABNORMAL LOW (ref 39.0–52.0)
Hemoglobin: 8.7 g/dL — ABNORMAL LOW (ref 13.0–17.0)
Hemoglobin: 8.2 g/dL — ABNORMAL LOW (ref 13.0–17.0)
MCH: 28.1 pg (ref 26.0–34.0)
MCH: 28 pg (ref 26.0–34.0)
MCHC: 33.6 g/dL (ref 30.0–36.0)
MCHC: 33.9 g/dL (ref 30.0–36.0)
MCV: 83.5 fL (ref 80.0–100.0)
MCV: 82.6 fL (ref 80.0–100.0)
Platelets: 226 10*3/uL (ref 150–400)
Platelets: 248 10*3/uL (ref 150–400)
RBC: 3.1 MIL/uL — ABNORMAL LOW (ref 4.22–5.81)
RBC: 2.93 MIL/uL — ABNORMAL LOW (ref 4.22–5.81)
RDW: 14.3 % (ref 11.5–15.5)
RDW: 14.3 % (ref 11.5–15.5)
WBC: 19.5 10*3/uL — ABNORMAL HIGH (ref 4.0–10.5)
WBC: 18.3 10*3/uL — ABNORMAL HIGH (ref 4.0–10.5)
nRBC: 0 % (ref 0.0–0.2)
nRBC: 0 % (ref 0.0–0.2)

## 2021-05-31 LAB — GLUCOSE, CAPILLARY
Glucose-Capillary: 103 mg/dL — ABNORMAL HIGH (ref 70–99)
Glucose-Capillary: 105 mg/dL — ABNORMAL HIGH (ref 70–99)
Glucose-Capillary: 137 mg/dL — ABNORMAL HIGH (ref 70–99)
Glucose-Capillary: 193 mg/dL — ABNORMAL HIGH (ref 70–99)

## 2021-05-31 LAB — POCT ACTIVATED CLOTTING TIME: Activated Clotting Time: 125 seconds

## 2021-05-31 LAB — RENAL FUNCTION PANEL
Albumin: 1.7 g/dL — ABNORMAL LOW (ref 3.5–5.0)
Anion gap: 20 — ABNORMAL HIGH (ref 5–15)
BUN: 163 mg/dL — ABNORMAL HIGH (ref 6–20)
CO2: 16 mmol/L — ABNORMAL LOW (ref 22–32)
Calcium: 7.7 mg/dL — ABNORMAL LOW (ref 8.9–10.3)
Chloride: 98 mmol/L (ref 98–111)
Creatinine, Ser: 8.59 mg/dL — ABNORMAL HIGH (ref 0.61–1.24)
GFR, Estimated: 7 mL/min — ABNORMAL LOW (ref >60)
Glucose, Bld: 113 mg/dL — ABNORMAL HIGH (ref 70–99)
Phosphorus: 6.1 mg/dL — ABNORMAL HIGH (ref 2.5–4.6)
Potassium: 4.5 mmol/L (ref 3.5–5.1)
Sodium: 134 mmol/L — ABNORMAL LOW (ref 135–145)

## 2021-05-31 LAB — MAGNESIUM: Magnesium: 3.4 mg/dL — ABNORMAL HIGH (ref 1.7–2.4)

## 2021-05-31 IMAGING — MR MR LUMBAR SPINE WO/W CM
4 of 7 series · 19 of 48 positions shown · IV contrast (cc GAD)
Comparison: CT lumbar spine [DATE]

CLINICAL DATA: Acute myelopathy.  Leukocytosis.  Cardiac arrest  .

EXAM:
MRI LUMBAR SPINE WITHOUT AND WITH CONTRAST
TECHNIQUE: Multiplanar and multiecho pulse sequences of the lumbar spine were
obtained without and with intravenous contrast.
CONTRAST:  8.8mL GADAVIST GADOBUTROL 1 MMOL/ML IV SOLN

[Series 32: T2 · sagittal · 4.0mm · 0.55mm/px · 3 of 15 slices shown (1 of 2)]
[im 1/15]
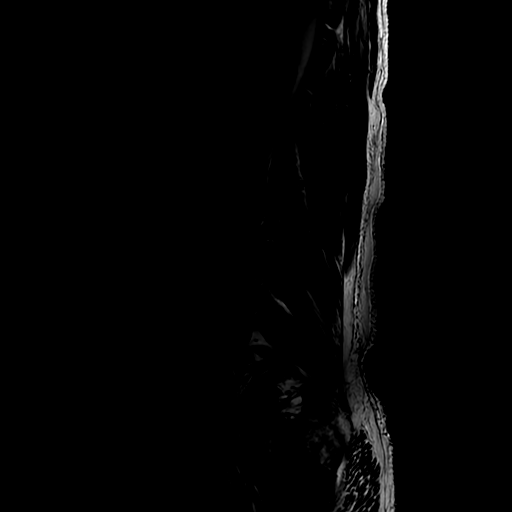
[im 8/15]
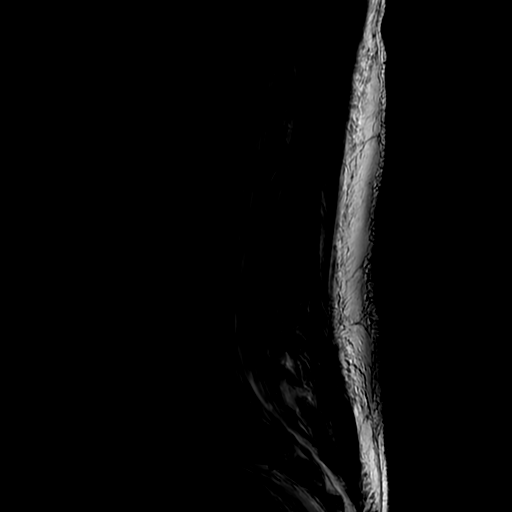
[im 15/15]
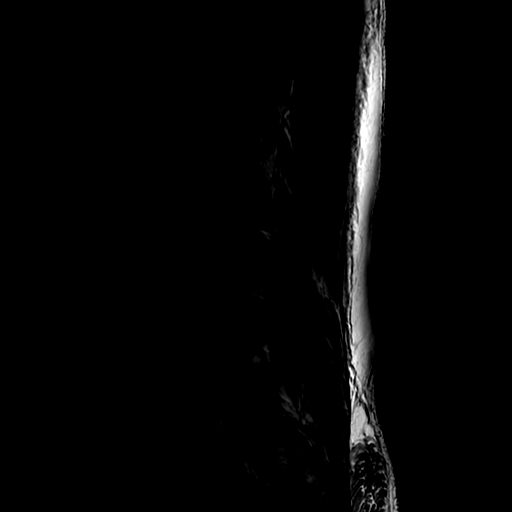

[Series 34: T1 · sagittal · 4.0mm · 0.55mm/px · 3 of 15 slices shown (1 of 2)]
[im 1/15]
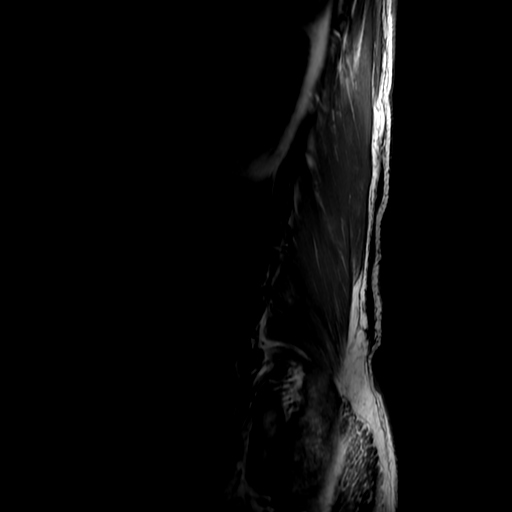
[im 10/15]
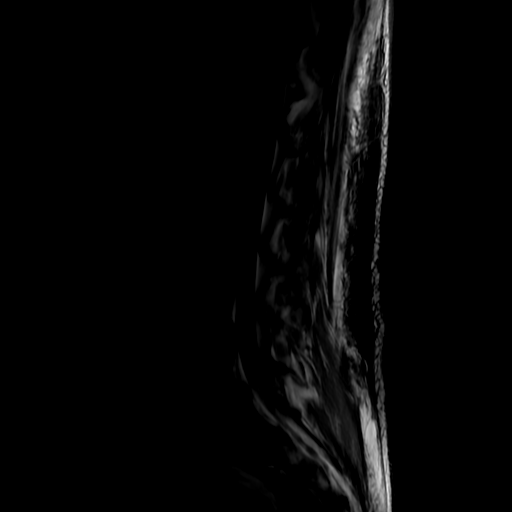
[im 15/15]
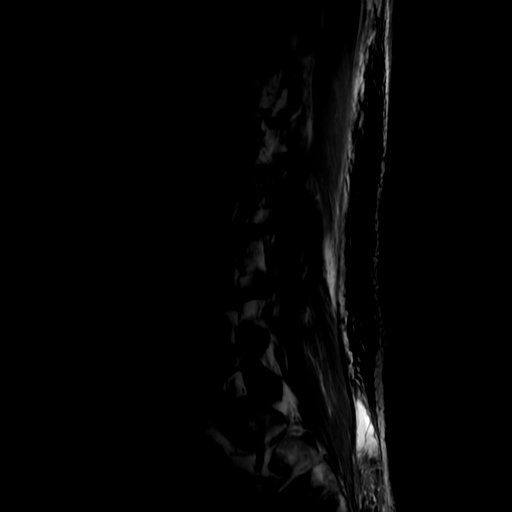

[Series 35: T2 · axial · 4.0mm · 0.39mm/px · z∈[-342,-148]mm · 10 of 45 slices shown (2 of 2)]
[im 1/45]
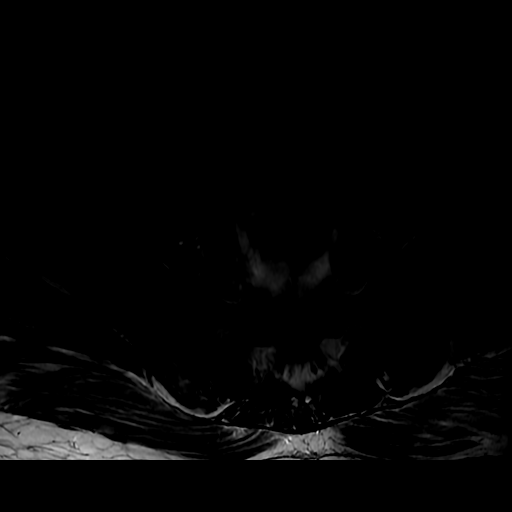
[im 5/45]
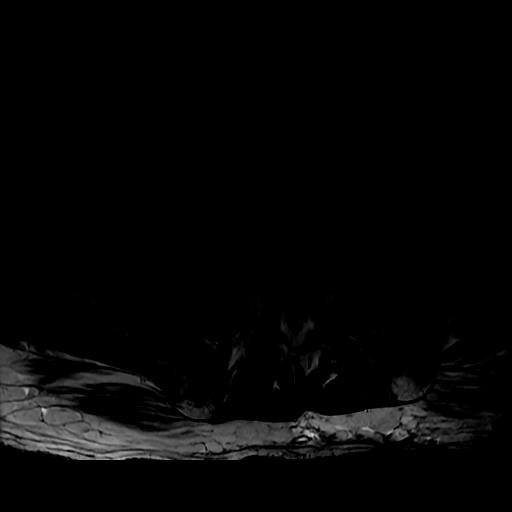
[im 9/45]
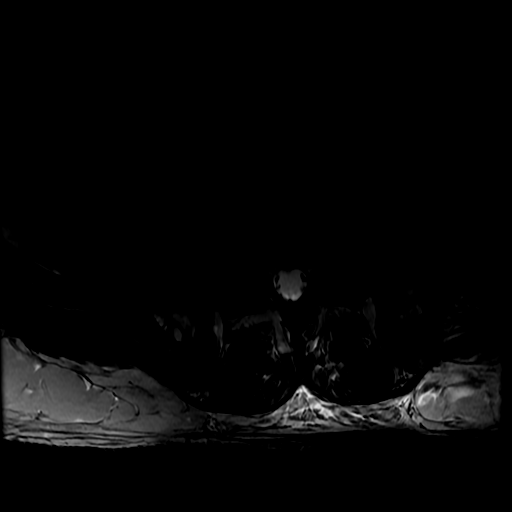
[im 14/45]
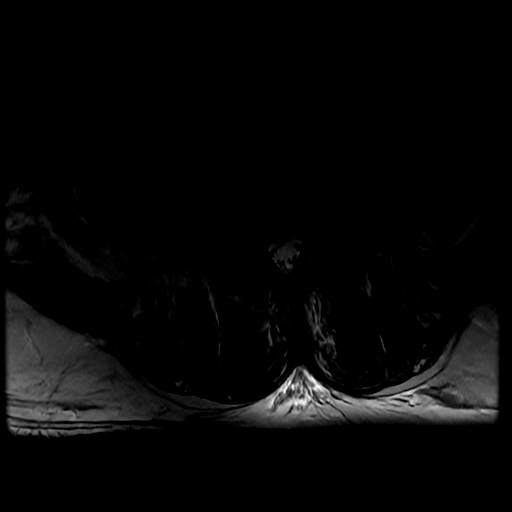
[im 18/45]
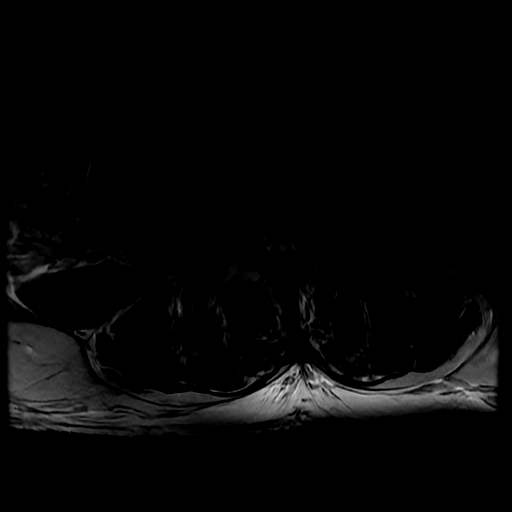
[im 23/45]
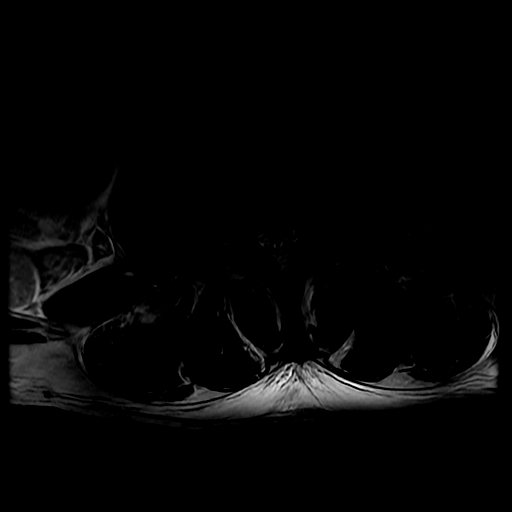
[im 27/45]
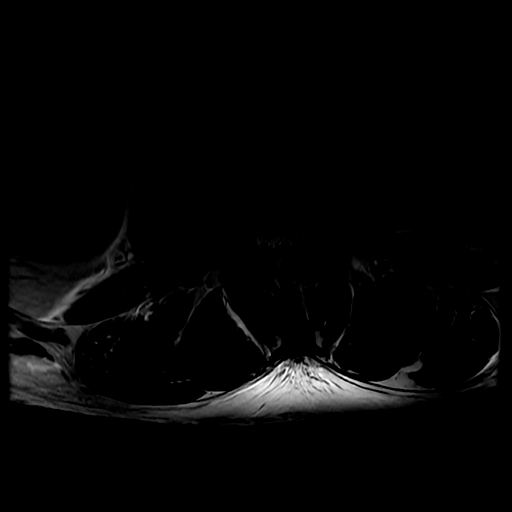
[im 31/45]
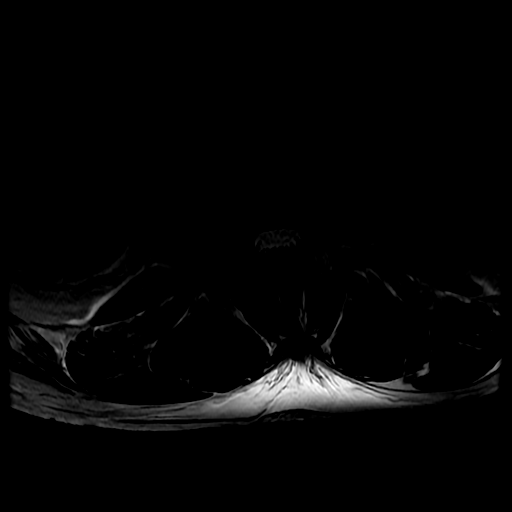
[im 36/45]
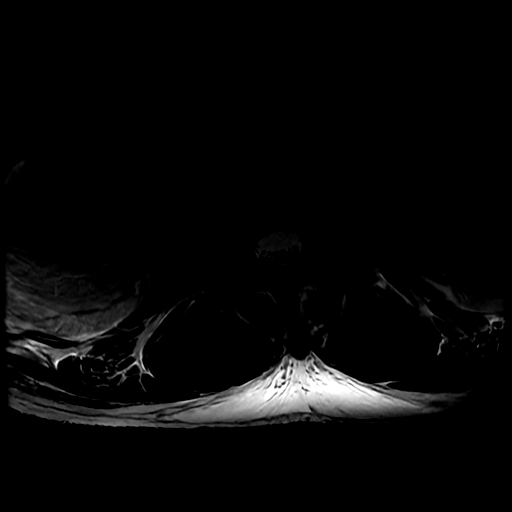
[im 40/45]
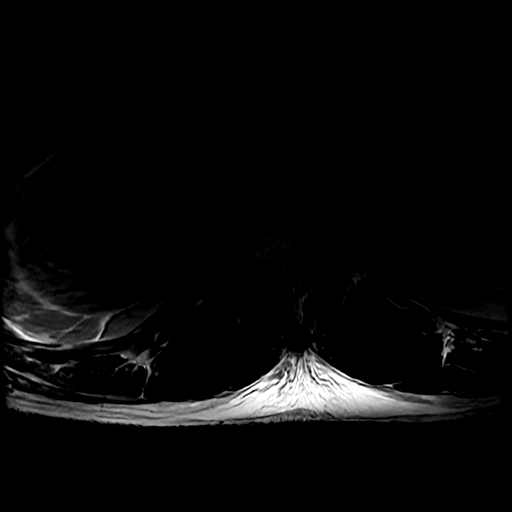

[Series 36: T1 · axial · 4.0mm · 0.39mm/px · z∈[-322,-148]mm · 3 of 45 slices shown (2 of 2)]
[im 5/45]
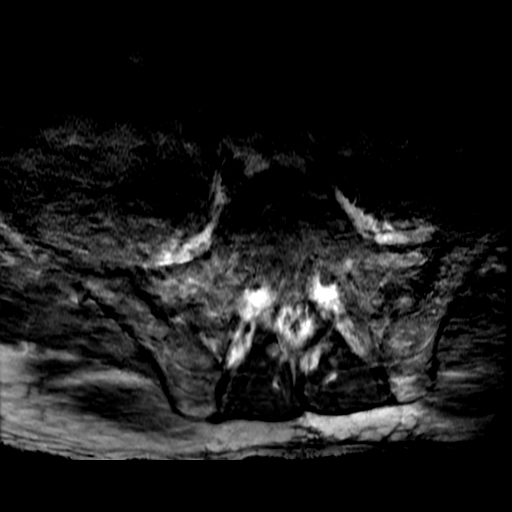
[im 23/45]
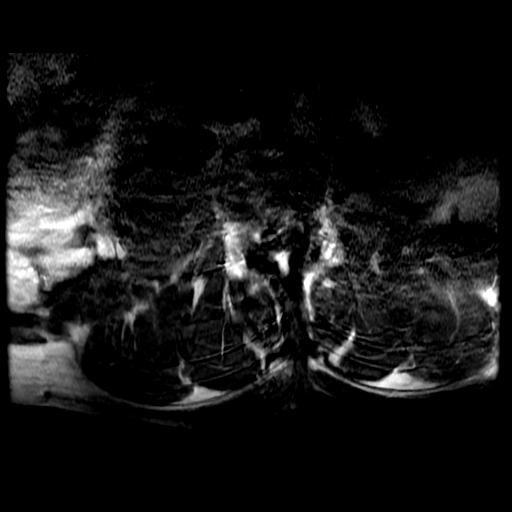
[im 40/45]
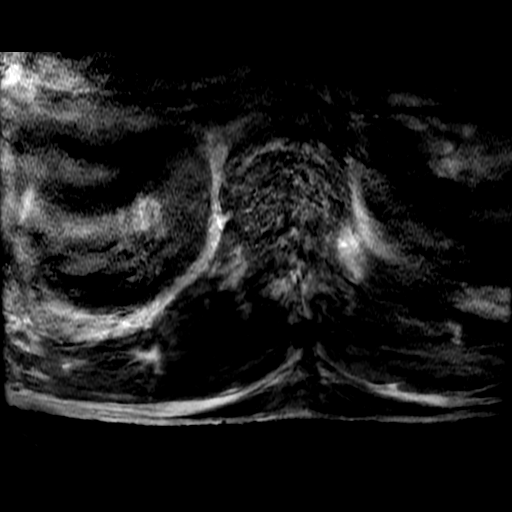

[19 of 48 positions shown; findings below may reference images not displayed]

FINDINGS: Segmentation:  5 lumbar vertebra.

Alignment:  Normal

Vertebrae: Negative for fracture or mass. No evidence of discitis
osteomyelitis.

Conus medullaris and cauda equina: Conus extends to the L1-2 level.
Conus and cauda equina appear normal.

Paraspinal and other soft tissues: Enlargement of the right
iliopsoas muscles. Complex fluid collection right psoas muscle
measuring 3.2 cm. The fluid collection is hypointense on T2 and T1.
Smaller adjacent fluid collection is seen inferior to the larger
collection. There is enhancement of the right iliopsoas muscle
around the fluid collections. Mild stranding in the tissues
surrounding the muscles.

Disc levels:

L1-2: Negative

L2-3: Negative

L3-4: Small central disc protrusion and bilateral facet
degeneration. Mild subarticular stenosis bilaterally

L4-5: Disc bulging and facet degeneration.  Negative for stenosis

L5-S1: Mild disc bulging.  Negative for stenosis.

Image quality degraded by motion.
IMPRESSION: Multiple fluid collections in the right ileo psoas muscle. The
largest collection is 3.2 cm. There is surrounding enhancement and
enlargement of the muscles. Findings most likely due to hematoma or
abscess. The patient has had a right femoral vascular catheter.

No evidence of discitis osteomyelitis in the lumbar spine.

Motion degraded study.

## 2021-05-31 IMAGING — MR MR CERVICAL SPINE WO/W CM
6 of 8 series · 23 of 48 positions shown · IV contrast (cc GAD)
Comparison: None.

CLINICAL DATA: Rule out CSF leak. Spontaneous intracranial
hypotension. Cardiac arrest. Leukocytosis.

EXAM:
MRI CERVICAL SPINE WITHOUT AND WITH CONTRAST
TECHNIQUE: Multiplanar and multiecho pulse sequences of the cervical spine, to
include the craniocervical junction and cervicothoracic junction,
were obtained without and with intravenous contrast.
CONTRAST:  8.8mL GADAVIST GADOBUTROL 1 MMOL/ML IV SOLN

[Series 24: T2 · sagittal · 3.0mm · 0.43mm/px · 2 of 17 slices shown (1 of 2)]
[im 1/17]
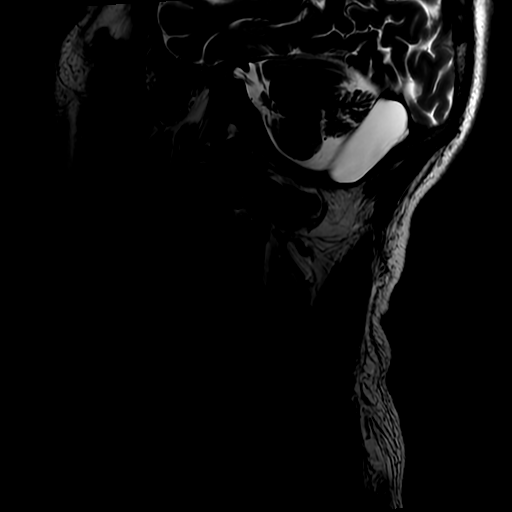
[im 17/17]
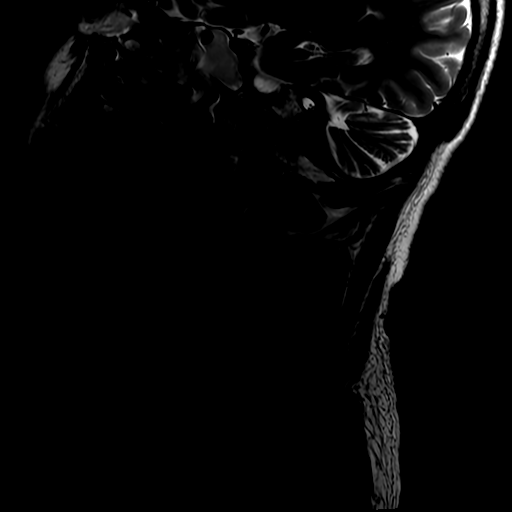

[Series 27: STIR · sagittal · 3.0mm · 0.66mm/px · 2 of 15 slices shown]
[im 1/15]
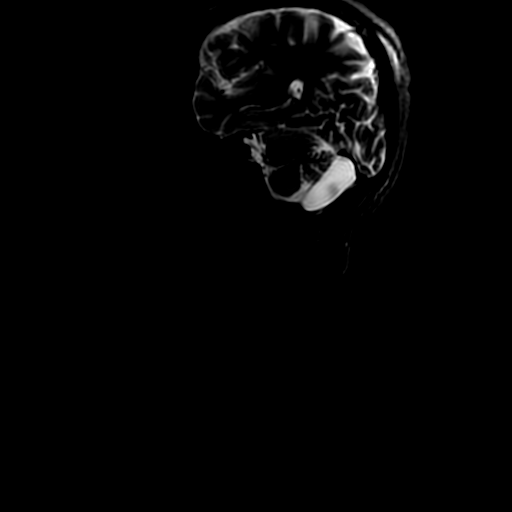
[im 15/15]
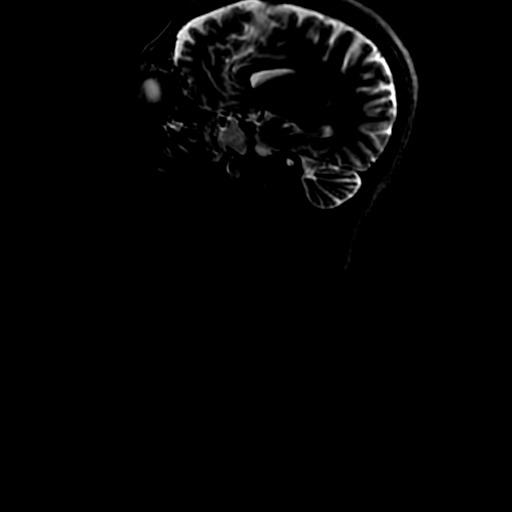

[Series 29: T2 · axial · 3.0mm · 0.35mm/px · z∈[+78,+227]mm · 8 of 48 slices shown (2 of 2)]
[im 1/48]
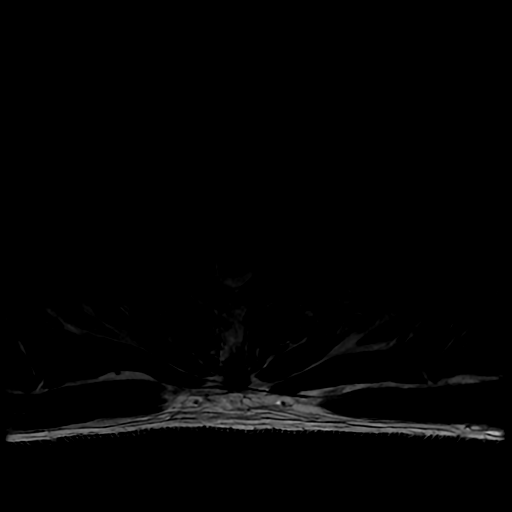
[im 7/48]
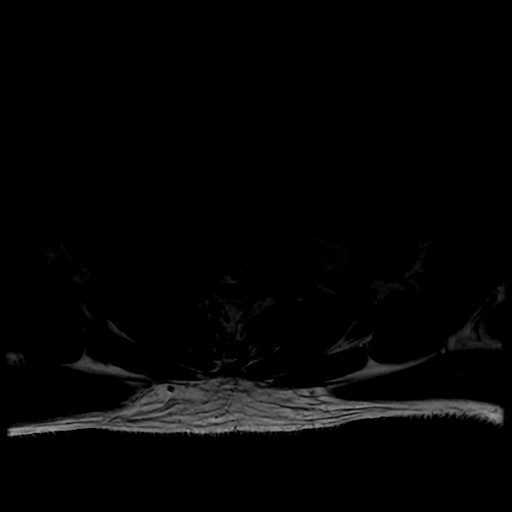
[im 14/48]
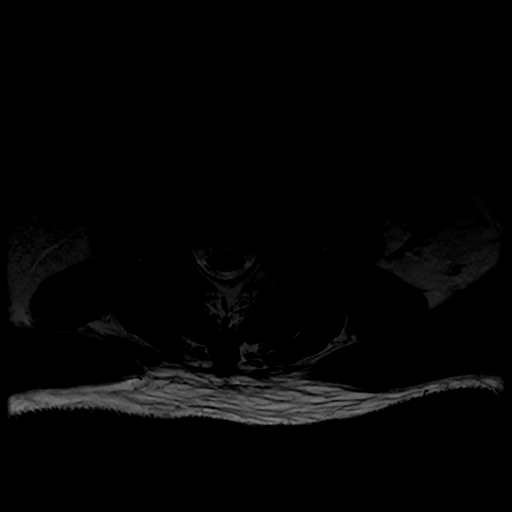
[im 21/48]
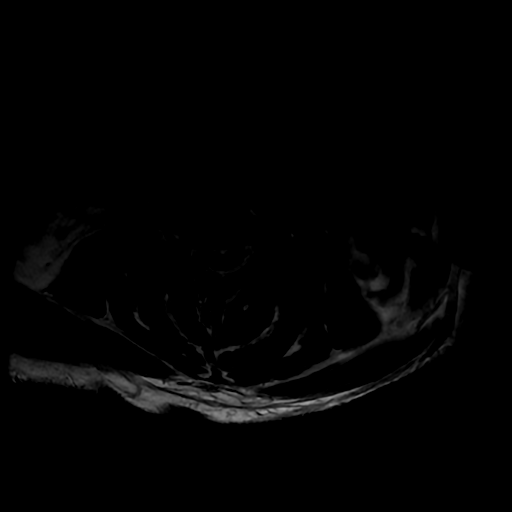
[im 27/48]
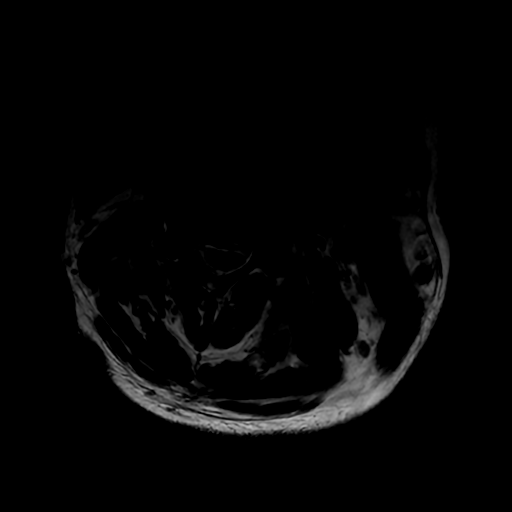
[im 34/48]
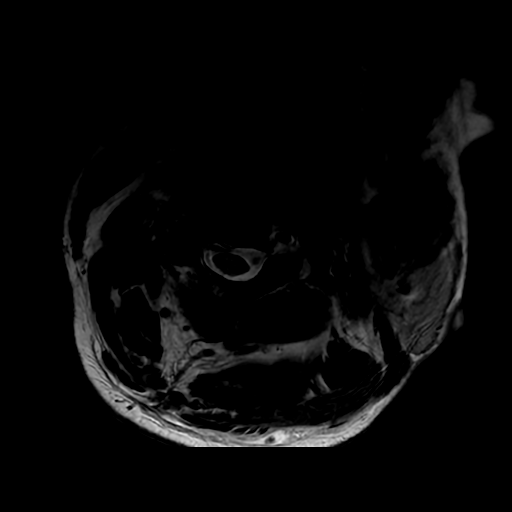
[im 41/48]
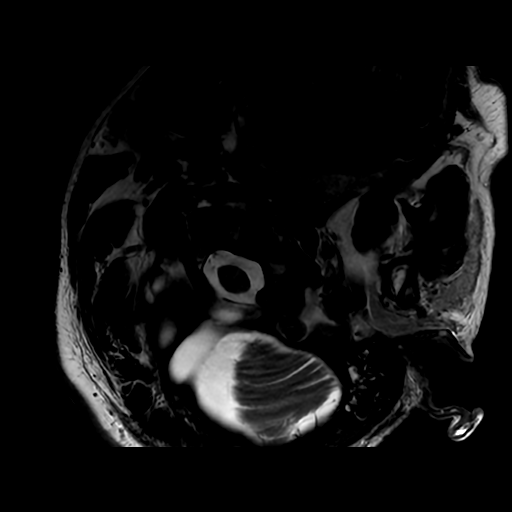
[im 48/48]
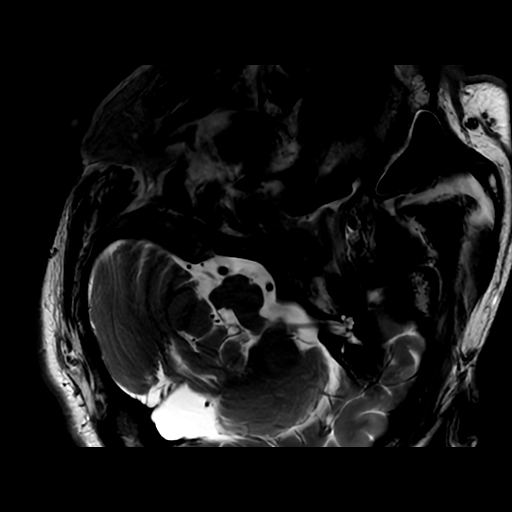

[Series 30: T1 · axial · non-contrast · 3.0mm · 0.70mm/px · z∈[+72,+196]mm · 6 of 35 slices shown]
[im 1/35]
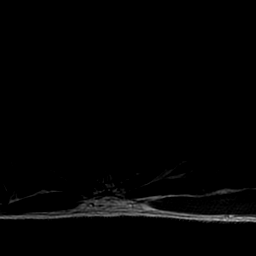
[im 7/35]
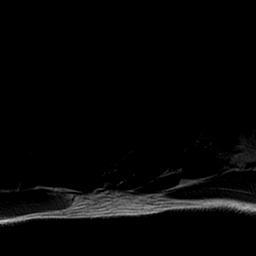
[im 14/35]
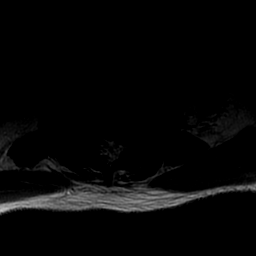
[im 21/35]
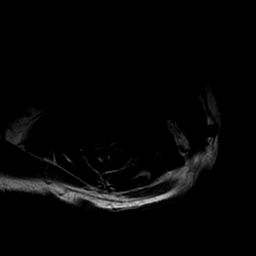
[im 28/35]
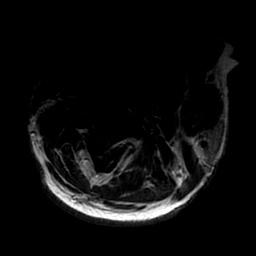
[im 35/35]
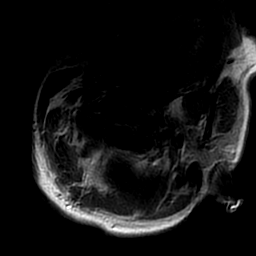

[Series 42: T1 fat-sat post-contrast · sagittal · 3.0mm · 0.43mm/px · 3 of 17 slices shown]
[im 1/17]
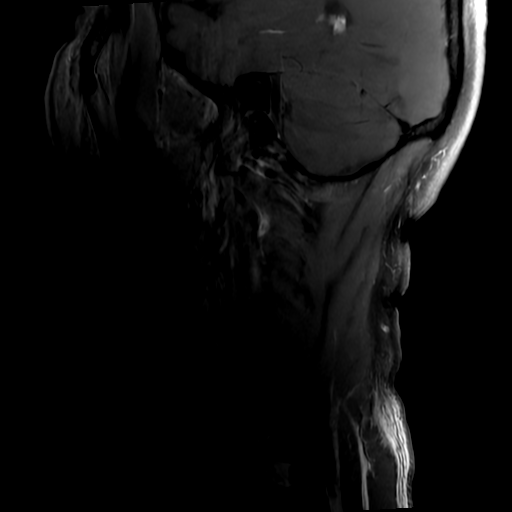
[im 9/17]
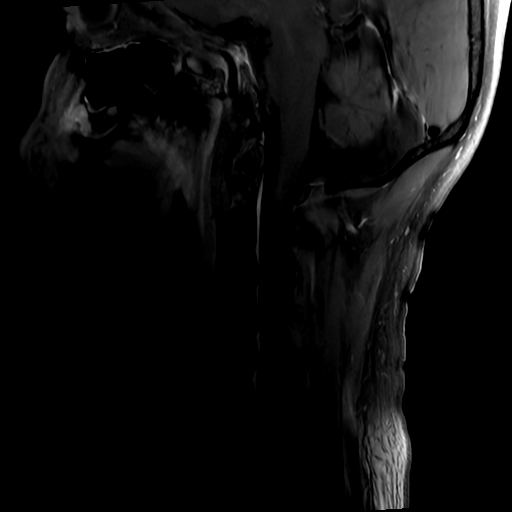
[im 17/17]
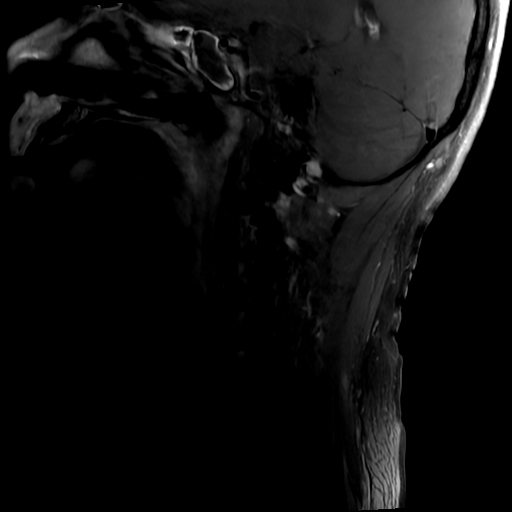

[Series 43: T1 post-contrast · axial · 3.0mm · 0.35mm/px · z∈[+78,+97]mm · 2 of 48 slices shown]
[im 1/48]
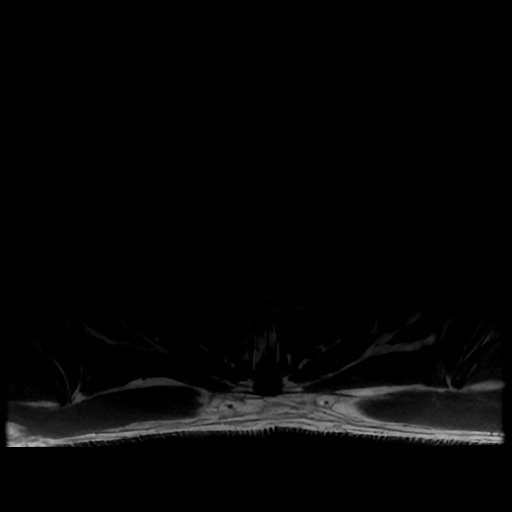
[im 7/48]
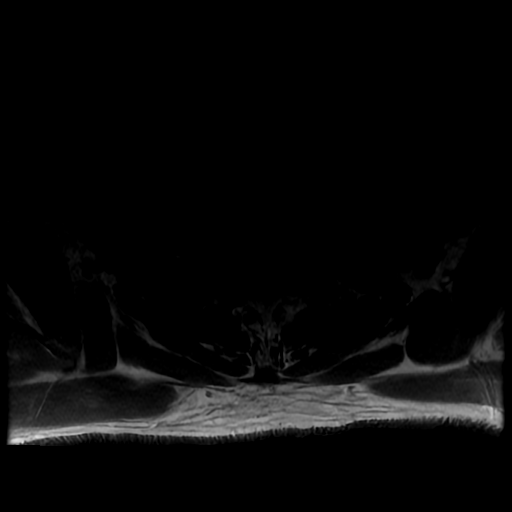

[23 of 48 positions shown; findings below may reference images not displayed]

FINDINGS: Alignment: Normal

Vertebrae: Normal bone marrow. Negative for fracture or mass. No
evidence of spinal infection.

Cord: Normal signal morphology.

Posterior Fossa, vertebral arteries, paraspinal tissues: Mega
cisterna magna. No soft tissue fluid collection in the cervical
spine.

Disc levels:

Small right paracentral disc protrusion at C5-6 without significant
stenosis. Remaining disc spaces normal.

Image quality degraded by motion
IMPRESSION: Motion degraded study.

Small right-sided disc protrusion at C5-6

Negative for spinal infection or fluid collection in the cervical
spine.

## 2021-05-31 IMAGING — MR MR HEAD WO/W CM
6 of 13 series · 24 of 48 positions shown · IV contrast (cc GAD)
Comparison: CT and [DATE]

CLINICAL DATA: Acute neuro deficit. Rule out stroke. Respiratory
arrest. Leukocytosis.

EXAM:
MRI HEAD WITHOUT AND WITH CONTRAST
TECHNIQUE: Multiplanar, multiecho pulse sequences of the brain and surrounding
structures were obtained without and with intravenous contrast.
CONTRAST:  8.8mL GADAVIST GADOBUTROL 1 MMOL/ML IV SOLN

[Series 2: DWI · axial · 3.0mm · 0.94mm/px · z∈[-60,+98]mm · 7 of 108 slices shown (1 of 2)]
[im 1/108]
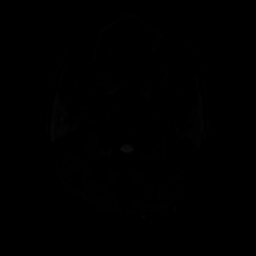
[im 18/108]
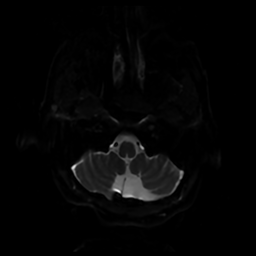
[im 36/108]
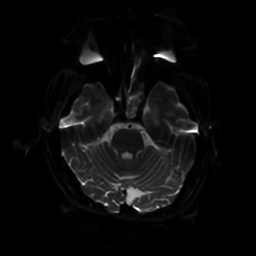
[im 54/108]
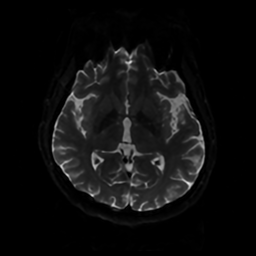
[im 72/108]
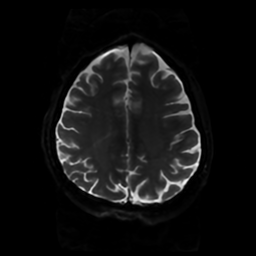
[im 90/108]
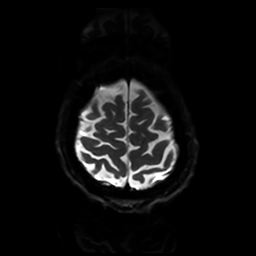
[im 108/108]
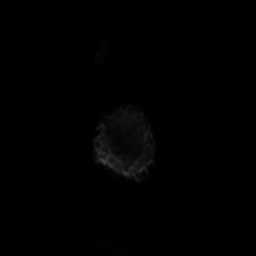

[Series 3: DWI · coronal · 4.0mm · 0.94mm/px · 5 of 69 slices shown (2 of 2)]
[im 1/69]
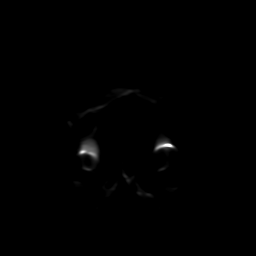
[im 18/69]
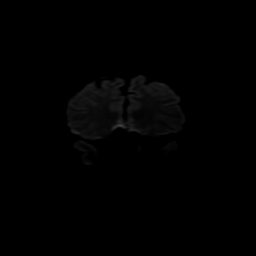
[im 35/69]
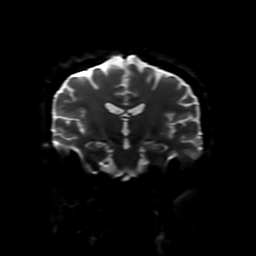
[im 52/69]
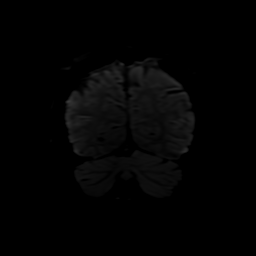
[im 69/69]
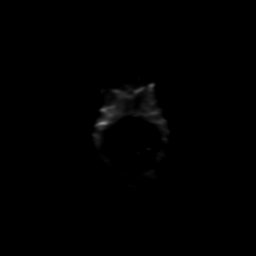

[Series 4: FLAIR · sagittal · 5.0mm · 0.23mm/px · 2 of 27 slices shown (1 of 2)]
[im 1/27]
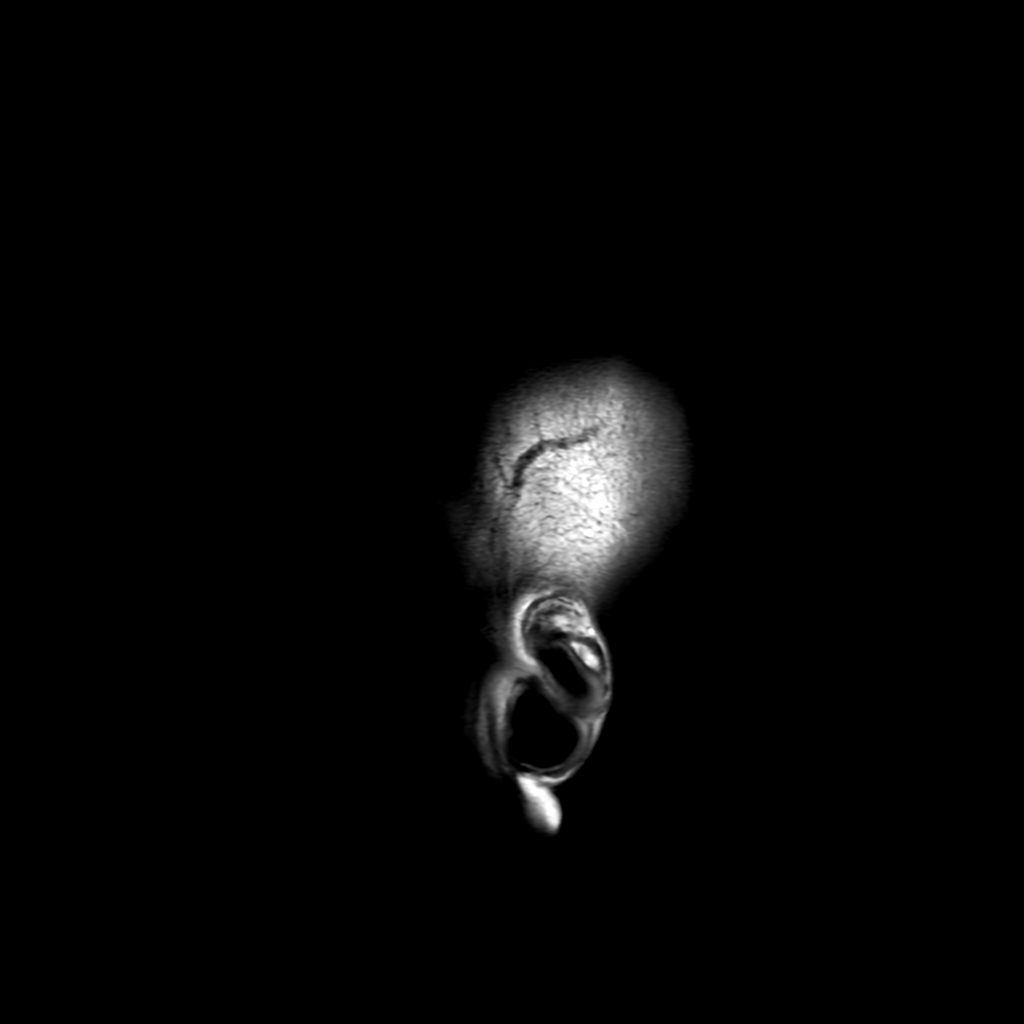
[im 27/27]
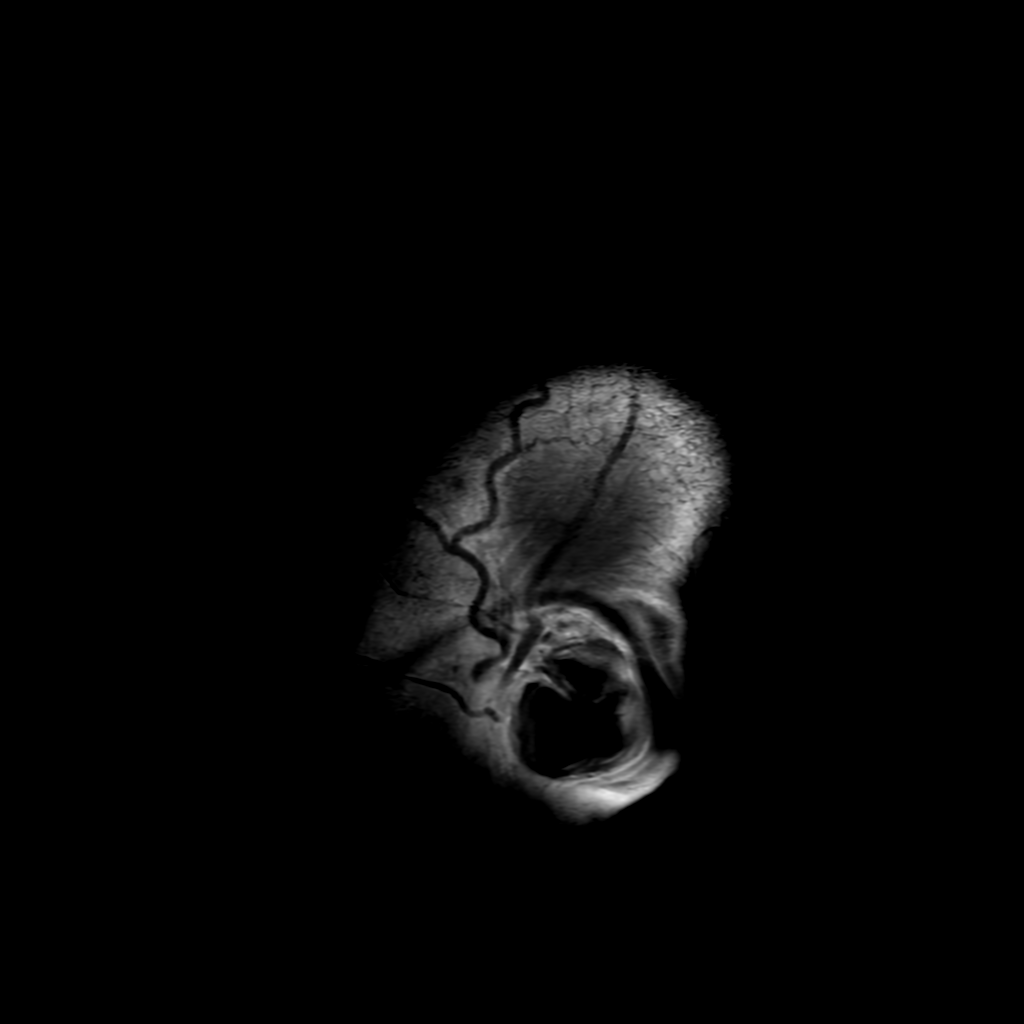

[Series 6: FLAIR · axial · 4.0mm · 0.45mm/px · z∈[-59,+98]mm · 3 of 37 slices shown (2 of 2)]
[im 1/37]
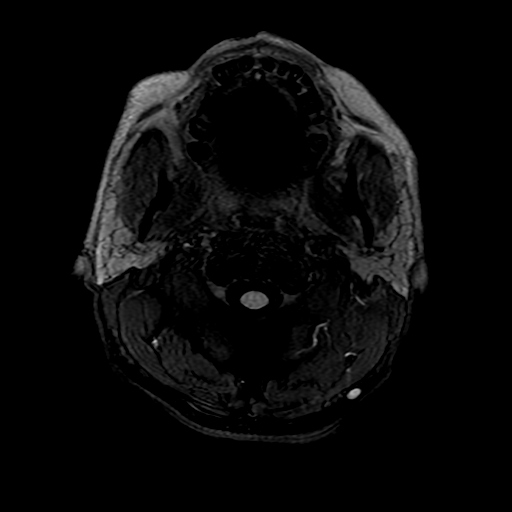
[im 19/37]
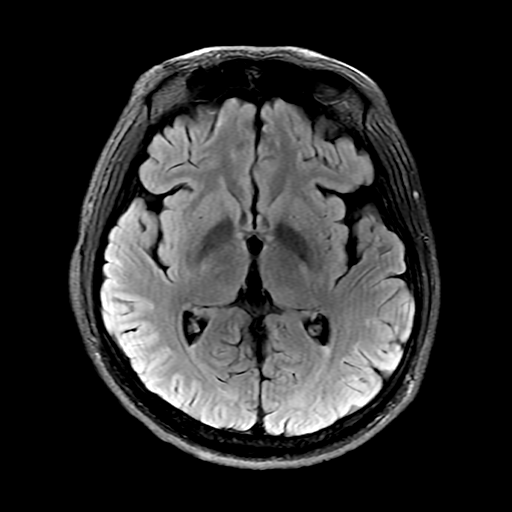
[im 37/37]
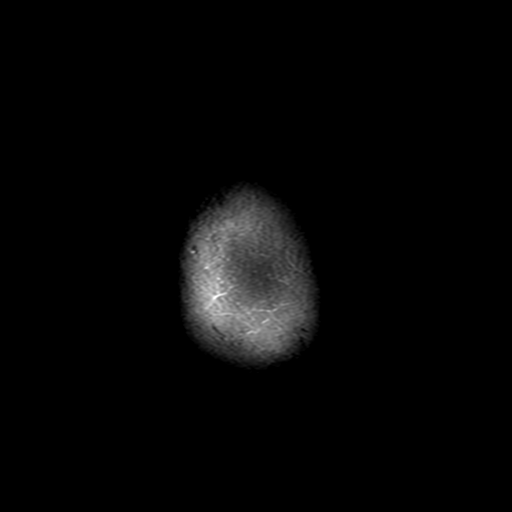

[Series 250: ADC · axial · 3.0mm · 0.94mm/px · z∈[-60,+98]mm · 4 of 54 slices shown (1 of 2)]
[im 1/54]
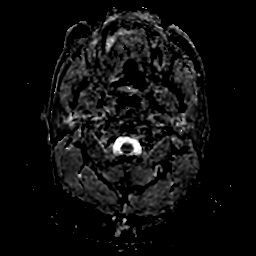
[im 18/54]
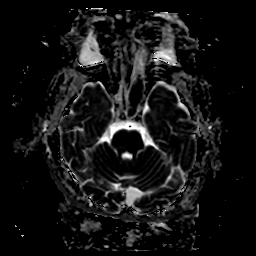
[im 36/54]
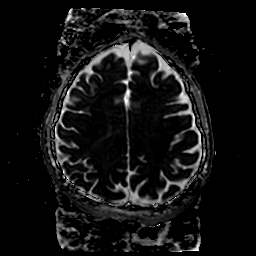
[im 54/54]
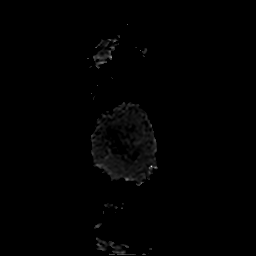

[Series 350: ADC · coronal · 4.0mm · 0.94mm/px · 3 of 35 slices shown (2 of 2)]
[im 1/35]
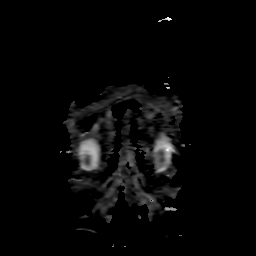
[im 18/35]
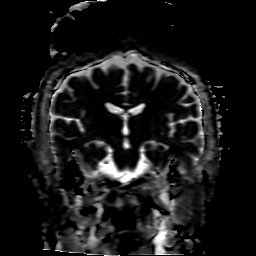
[im 35/35]
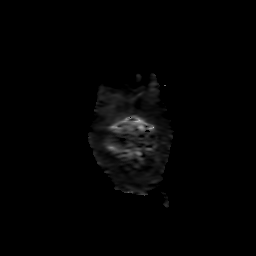

[24 of 48 positions shown; findings below may reference images not displayed]

FINDINGS: Brain: No acute infarction, hemorrhage, hydrocephalus, extra-axial
collection or mass lesion. Normal white matter. Normal enhancement
postcontrast infusion.

Vascular: Normal arterial flow voids.

Skull and upper cervical spine: Negative

Sinuses/Orbits: Mucosal edema paranasal sinuses. Near complete
opacification left sphenoid sinus with air-fluid level.

Other: Motion degraded study
IMPRESSION: Motion degraded exam.  Normal MRI of the brain with contrast.

Mucosal edema paranasal sinuses with air-fluid level left sphenoid
sinus

## 2021-05-31 IMAGING — MR MR THORACIC SPINE WO/W CM
6 of 9 series · 25 of 48 positions shown · IV contrast (gadavist)
Comparison: CT thoracic spine [DATE]

CLINICAL DATA: Acute myelopathy.  Cardiac arrest.  Leukocytosis

EXAM:
MRI THORACIC WITHOUT AND WITH CONTRAST
TECHNIQUE: Multiplanar and multiecho pulse sequences of the thoracic spine were
obtained without and with intravenous contrast.
CONTRAST:  8.8mL GADAVIST GADOBUTROL 1 MMOL/ML IV SOLN

[Series 13: T1 · sagittal · 3.0mm · 0.90mm/px · 2 of 12 slices shown (1 of 3)]
[im 1/12]
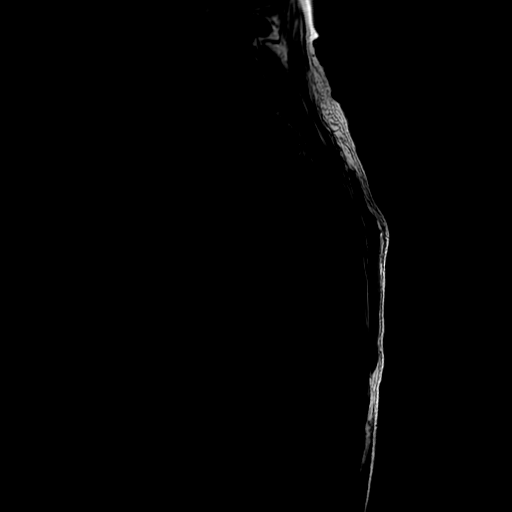
[im 12/12]
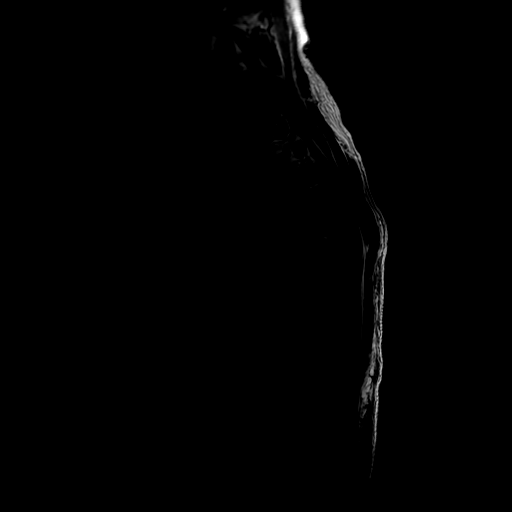

[Series 16: T2 · sagittal · 3.0mm · 0.66mm/px · 3 of 15 slices shown (1 of 2)]
[im 1/15]
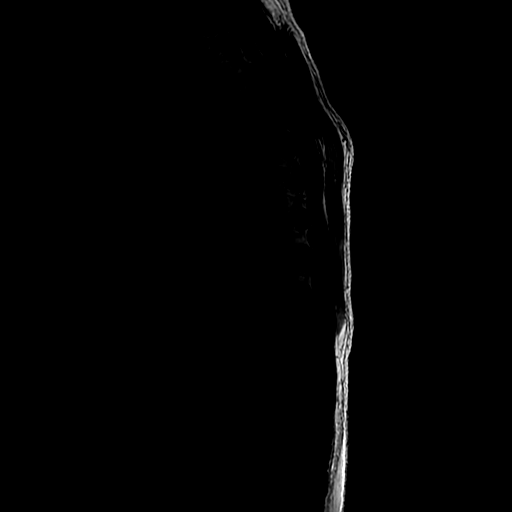
[im 8/15]
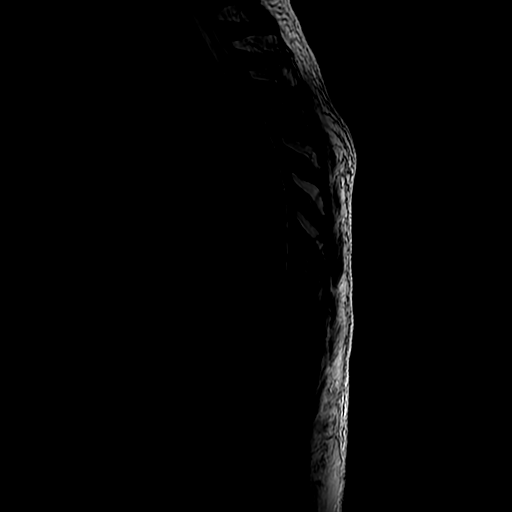
[im 15/15]
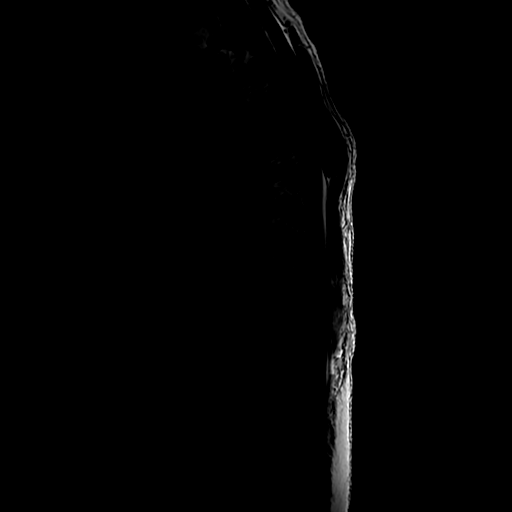

[Series 18: T1 · sagittal · 3.0mm · 0.66mm/px · 3 of 15 slices shown (2 of 3)]
[im 1/15]
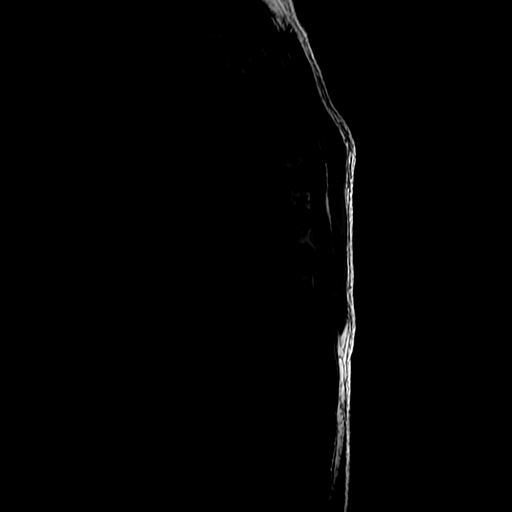
[im 8/15]
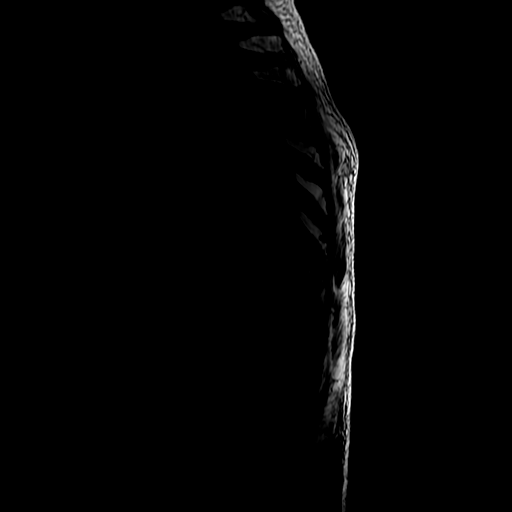
[im 15/15]
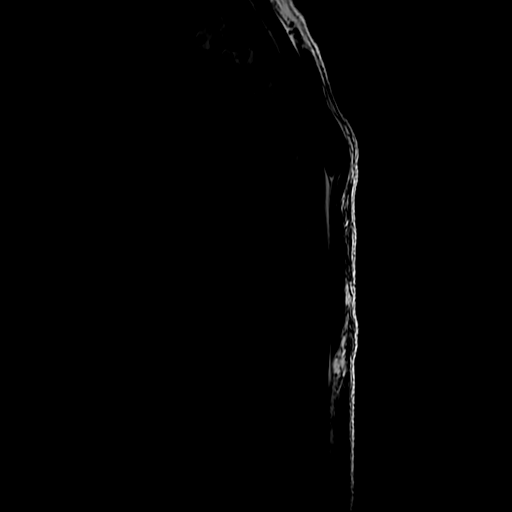

[Series 20: T2 · axial · 4.0mm · 0.39mm/px · z∈[-126,+109]mm · 8 of 39 slices shown (2 of 2)]
[im 1/39]
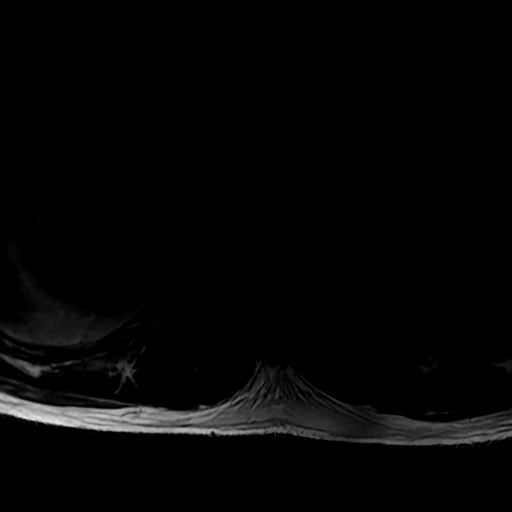
[im 6/39]
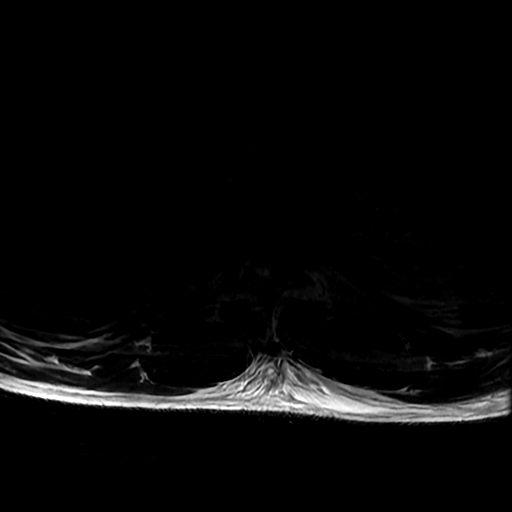
[im 11/39]
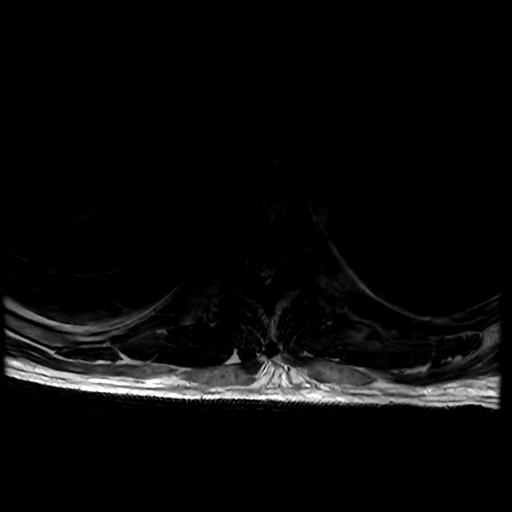
[im 17/39]
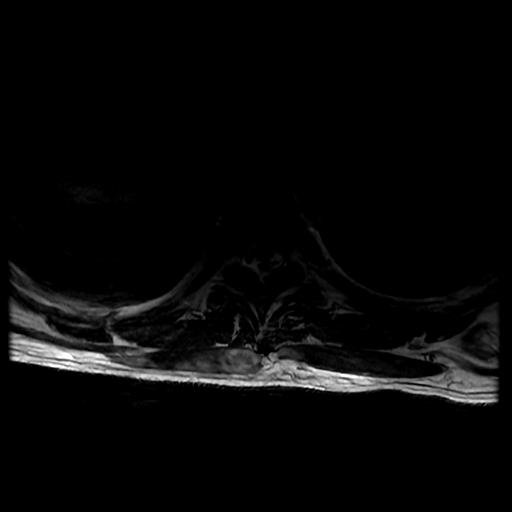
[im 22/39]
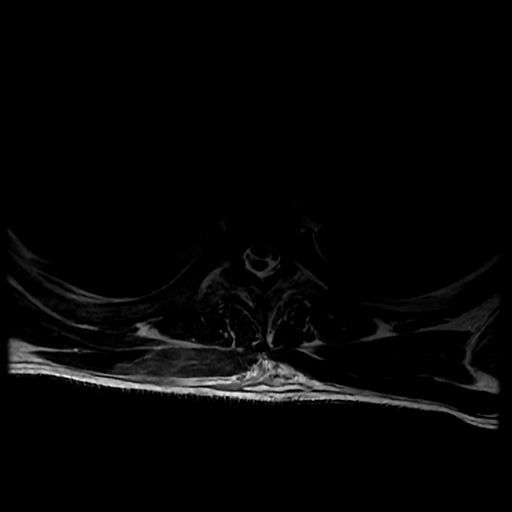
[im 28/39]
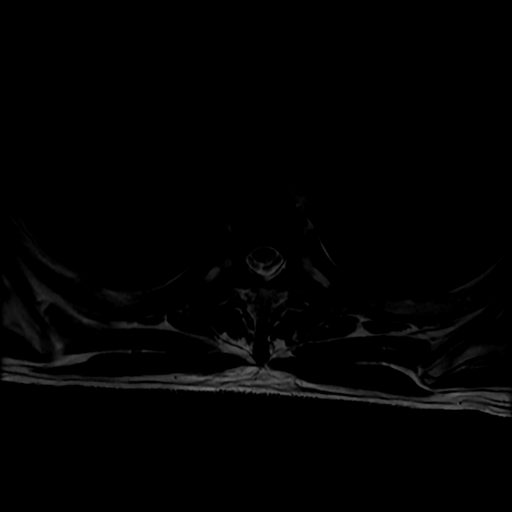
[im 33/39]
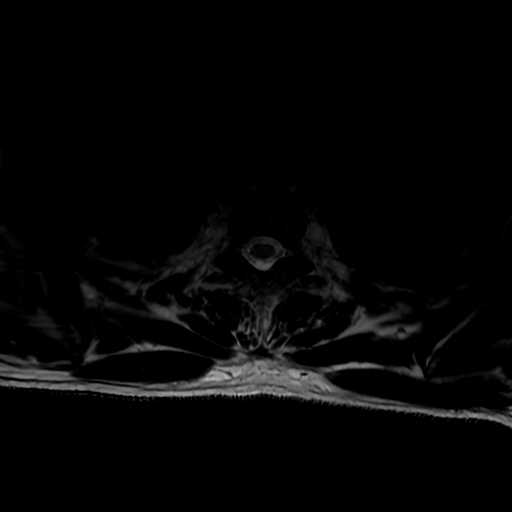
[im 39/39]
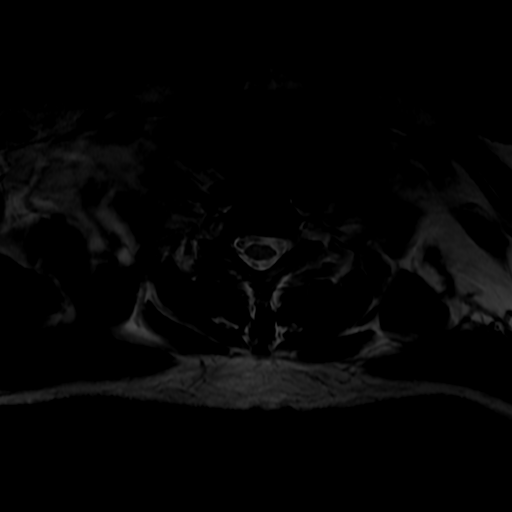

[Series 22: T1 · axial · non-contrast · 3.0mm · 0.70mm/px · z∈[-127,+115]mm · 8 of 52 slices shown (3 of 3)]
[im 1/52]
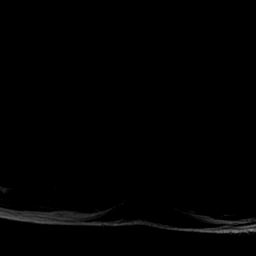
[im 6/52]
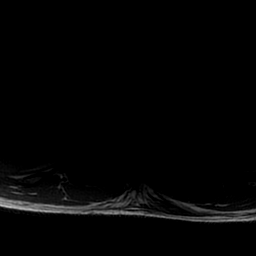
[im 18/52]
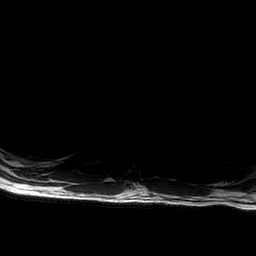
[im 23/52]
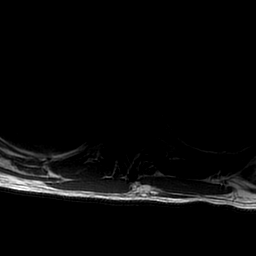
[im 29/52]
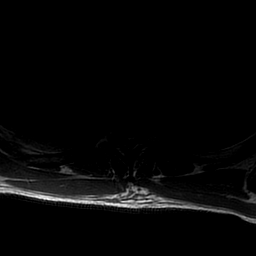
[im 35/52]
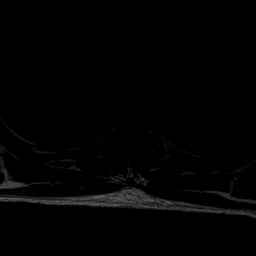
[im 46/52]
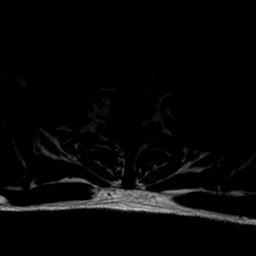
[im 52/52]
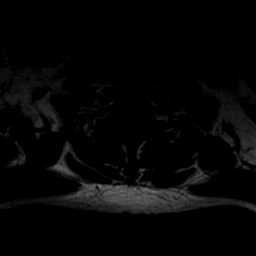

[Series 44: T1 fat-sat post-contrast · sagittal · 3.0mm · 0.66mm/px · 1 of 15 slices shown]
[im 1/15]
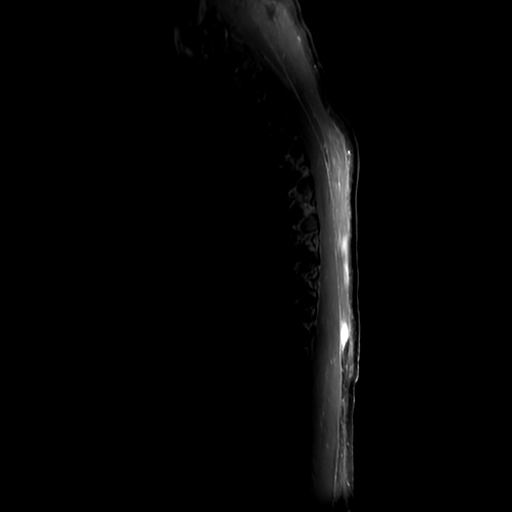

[25 of 48 positions shown; findings below may reference images not displayed]

FINDINGS: Alignment:  Normal

Vertebrae: Normal bone marrow. Negative for fracture or mass. No
evidence of thoracic spine infection.

Cord: Normal signal and morphology. No cord compression. Cord signal
evaluation is limited by motion.

Paraspinal and other soft tissues: Small right pleural effusion. No
paraspinous mass or edema.

Disc levels:

There is significant disc degeneration. No thoracic spinal stenosis.
IMPRESSION: Motion degraded study.  No significant abnormality thoracic spine.

## 2021-05-31 MED ORDER — ALBUMIN HUMAN 25 % IV SOLN
25.0000 g | Freq: Once | INTRAVENOUS | Status: AC
  Administered 2021-05-31: 25 g via INTRAVENOUS
  Filled 2021-05-31: qty 100

## 2021-05-31 MED ORDER — DIPHENHYDRAMINE HCL 50 MG/ML IJ SOLN
50.0000 mg | Freq: Once | INTRAMUSCULAR | Status: AC
  Administered 2021-05-31: 50 mg via INTRAVENOUS
  Filled 2021-05-31: qty 1

## 2021-05-31 MED ORDER — FENTANYL CITRATE PF 50 MCG/ML IJ SOSY
PREFILLED_SYRINGE | INTRAMUSCULAR | Status: AC
  Administered 2021-05-31: 100 ug
  Filled 2021-05-31: qty 2

## 2021-05-31 MED ORDER — GADOBUTROL 1 MMOL/ML IV SOLN
8.8000 mL | Freq: Once | INTRAVENOUS | Status: AC | PRN
  Administered 2021-05-31: 8.8 mL via INTRAVENOUS

## 2021-05-31 MED ORDER — HEPARIN SODIUM (PORCINE) 1000 UNIT/ML IJ SOLN
INTRAMUSCULAR | Status: AC
  Administered 2021-05-31: 1000 [IU] via INTRAVENOUS_CENTRAL
  Filled 2021-05-31: qty 4

## 2021-05-31 MED ORDER — FENTANYL CITRATE PF 50 MCG/ML IJ SOSY
100.0000 ug | PREFILLED_SYRINGE | INTRAMUSCULAR | Status: AC | PRN
  Administered 2021-05-31: 100 ug via INTRAVENOUS
  Filled 2021-05-31: qty 2

## 2021-05-31 NOTE — Progress Notes (Signed)
Neurology Progress Note  Events since neurology consult on 05/30/21: Surgery saw for possible bowel ischemia, now thought to be ileus. Creatinine now over 8. Nephrology plans HD today. Orthopaedics consulted and do not believe patient has compartment syndrome. Notes mention encephalopathy likely due to 20 mins CPR.   Spanish interpretor used during exam.   S: He tells PCCM (NP was in room) that his legs feel better. He is tired and hungry asking for water. Abdominal pain improved. MRIs ordered for today and he will receive pre test anxiety treatment per PCCM. He has no other complaints.   O: Current vital signs: BP 119/78 (BP Location: Left Arm)    Pulse 83    Temp 98.3 F (36.8 C) (Oral)    Resp (!) 21    Ht 6' (1.829 m)    Wt 88.6 kg    SpO2 93%    BMI 26.49 kg/m  Vital signs in last 24 hours: Temp:  [98.3 F (36.8 C)-99.1 F (37.3 C)] 98.3 F (36.8 C) (01/22 0736) Pulse Rate:  [82-111] 83 (01/22 0800) Resp:  [15-28] 21 (01/22 0800) BP: (81-147)/(59-96) 119/78 (01/22 0800) SpO2:  [90 %-96 %] 93 % (01/22 0800) Weight:  [88.6 kg] 88.6 kg (01/22 0500)  GENERAL: Acutely ill, but not toxic, appearing male. Awake, alert in NAD. HEENT: Normocephalic and atraumatic. LUNGS: Normal respiratory effort.  CV: RRR on tele.  Ext: warm. Dressing on right knee dry.   NEURO:  Mental Status: Alert. Oriented to self, state. Does not know place, city, day, date, month, or year (states it is 1200). He is fidgety in bed with lack of concentration and is easily distracted.  Speech/Language: speech is without aphasia or dysarthria.  When asked to name watch, he looks at his wrist and states he does not have his watch on, then he taps NPs watch several times.   Cranial Nerves:  II: PERRL. Visual fields full.  III, IV, VI: EOMI. Eyelids elevate symmetrically.  V: Sensation is intact to light touch and symmetrical to face.  VII: Smile is symmetrical.  VIII: hearing intact to voice. IX, X: Palate  elevates symmetrically. Phonation is normal.  ZO:[id number]:Shoulder shrug 5/5. XII: tongue is midline without fasciculations. Motor:  RU/RL extremities. Weak effort for strength on RUE, 4/5. RLE is moved when asked and he will not lift it off the bed, but can wiggle his toes.  LU/LL extremities: upper strength is normal. Does not move LLE at all nor wiggle.  Left thigh is slightly larger than right and is tighter in tone.  Sensation- Intact to light touch bilaterally. His sensation to light touch is a little less on left compared to left side.  Coordination: He can not find my finger at first or does not understand. After coaching, he is able and not ataxic.  Can not perform HKS.   DTRs:  RUE:  brachioradialis  1     biceps  1 RLE:  patella  0    LUE:  brachioradialis  1   biceps  1 LLE:  patella   0 Gait- deferred.  Medications  Current Facility-Administered Medications:    0.9 %  sodium chloride infusion, 250 mL, Intravenous, Continuous, Hunsucker, Lesia SagoMatthew R, MD, Stopped at 05/29/21 1602   Place/Maintain arterial line, , , Until Discontinued **AND** 0.9 %  sodium chloride infusion, , Intra-arterial, PRN, Hunsucker, Lesia SagoMatthew R, MD   0.9 %  sodium chloride infusion, 100 mL, Intravenous, PRN, Tyler PitaKruska, Lindsay A, MD   0.9 %  sodium chloride infusion, 100 mL, Intravenous, PRN, Tyler Pita, MD   0.9 %  sodium chloride infusion, , Intravenous, Continuous, Ogan, Thomasene Lot, MD, Held at 05/30/21 1023   acetaminophen (TYLENOL) tablet 650 mg, 650 mg, Oral, Q4H PRN, 650 mg at 05/29/21 2201 **OR** acetaminophen (TYLENOL) 160 MG/5ML solution 650 mg, 650 mg, Per Tube, Q4H PRN, 650 mg at 05/28/21 1901 **OR** acetaminophen (TYLENOL) suppository 650 mg, 650 mg, Rectal, Q4H PRN, Agarwala, Ravi, MD   albuterol (PROVENTIL) (2.5 MG/3ML) 0.083% nebulizer solution 2.5 mg, 2.5 mg, Nebulization, Q2H PRN, Hunsucker, Lesia Sago, MD, 2.5 mg at 05/24/21 1143   alteplase (CATHFLO ACTIVASE) injection 2 mg, 2 mg,  Intracatheter, Once PRN, Tyler Pita, MD   B-complex with vitamin C tablet 1 tablet, 1 tablet, Per Tube, Daily, Agarwala, Ravi, MD, 1 tablet at 05/31/21 0944   cefTRIAXone (ROCEPHIN) 2 g in sodium chloride 0.9 % 100 mL IVPB, 2 g, Intravenous, Q24H, Dewald, Jonathan B, MD, Last Rate: 200 mL/hr at 05/31/21 0800, 2 g at 05/31/21 0800   Chlorhexidine Gluconate Cloth 2 % PADS 6 each, 6 each, Topical, Q0600, Hunsucker, Lesia Sago, MD, 6 each at 05/30/21 0500   Chlorhexidine Gluconate Cloth 2 % PADS 6 each, 6 each, Topical, Q0600, Tyler Pita, MD, 6 each at 05/31/21 0300   dexmedetomidine (PRECEDEX) 400 MCG/100ML (4 mcg/mL) infusion, 0.4-1.2 mcg/kg/hr, Intravenous, Titrated, Melody Comas B, MD, Last Rate: 17.32 mL/hr at 05/31/21 1133, 0.8 mcg/kg/hr at 05/31/21 1133   docusate (COLACE) 50 MG/5ML liquid 100 mg, 100 mg, Per Tube, BID, Agarwala, Ravi, MD, 100 mg at 05/28/21 0926   feeding supplement (ENSURE ENLIVE / ENSURE PLUS) liquid 237 mL, 237 mL, Oral, BID BM, Melody Comas B, MD, 237 mL at 05/31/21 0944   fentaNYL (SUBLIMAZE) injection 100 mcg, 100 mcg, Intravenous, Q1H PRN, Martina Sinner, MD   folic acid (FOLVITE) tablet 1 mg, 1 mg, Per Tube, Daily, Agarwala, Ravi, MD, 1 mg at 05/31/21 0944   free water 30 mL, 30 mL, Per Tube, Q4H, Agarwala, Ravi, MD, 30 mL at 05/31/21 0758   heparin injection 1,000 Units, 1,000 Units, Dialysis, PRN, Tyler Pita, MD   heparin injection 1,000-6,000 Units, 1,000-6,000 Units, CRRT, PRN, Delano Metz, MD, 2,800 Units at 05/28/21 1641   heparin injection 1,000-6,000 Units, 1,000-6,000 Units, CRRT, PRN, Terrial Rhodes, MD   heparin injection 5,000 Units, 5,000 Units, Subcutaneous, Q8H, Cloyd Stagers M, PA-C, 5,000 Units at 05/29/21 2201   hydrALAZINE (APRESOLINE) injection 10 mg, 10 mg, Intravenous, Q6H PRN, Pecola Leisure, Stephanie M, PA-C   insulin aspart (novoLOG) injection 2-6 Units, 2-6 Units, Subcutaneous, Q4H, Agarwala, Daleen Bo, MD, 2 Units  at 05/31/21 0758   lactated ringers infusion, , Intravenous, Continuous, Martina Sinner, MD, Last Rate: 100 mL/hr at 05/30/21 1500, Infusion Verify at 05/30/21 1500   lidocaine (PF) (XYLOCAINE) 1 % injection 5 mL, 5 mL, Intradermal, PRN, Tyler Pita, MD   lidocaine-prilocaine (EMLA) cream 1 application, 1 application, Topical, PRN, Tyler Pita, MD   MEDLINE mouth rinse, 15 mL, Mouth Rinse, BID, Melody Comas B, MD, 15 mL at 05/31/21 0945   metroNIDAZOLE (FLAGYL) IVPB 500 mg, 500 mg, Intravenous, Q12H, Melody Comas B, MD, Last Rate: 100 mL/hr at 05/31/21 0909, 500 mg at 05/31/21 0909   ondansetron (ZOFRAN) injection 4 mg, 4 mg, Intravenous, Q6H PRN, Agarwala, Ravi, MD, 4 mg at 05/30/21 0450   pantoprazole (PROTONIX) injection 40 mg, 40 mg, Intravenous, Q12H, Hunsucker, Lesia Sago, MD, 40 mg at  05/31/21 0944   pentafluoroprop-tetrafluoroeth (GEBAUERS) aerosol 1 application, 1 application, Topical, PRN, Tyler Pita, MD   polyethylene glycol (MIRALAX / GLYCOLAX) packet 17 g, 17 g, Per Tube, BID, Cloyd Stagers M, PA-C, 17 g at 05/28/21 8588   sodium chloride 0.9 % primer fluid for CRRT, , CRRT, PRN, Tyler Pita, MD   sodium chloride flush (NS) 0.9 % injection 10-40 mL, 10-40 mL, Intracatheter, PRN, Hunsucker, Lesia Sago, MD, 40 mL at 05/25/21 0045   thiamine tablet 100 mg, 100 mg, Per Tube, Daily, Agarwala, Daleen Bo, MD, 100 mg at 05/31/21 0944  Pertinent Labs  Creat 8.59. CK 13. 372/   Imaging MD has reviewed images in epic and the results pertinent to this consultation are:  CT Head 1. Stable and normal noncontrast CT appearance of the brain. 2. Paranasal sinus opacification following intubation.  MRI Brain  Ordered.   MRI lumbar spine and T spine.  Ordered.   Assessment: 46 yo male who presented a week ago after cardiac arrest at home. 20 mins CPR and 5 amps of Epi before ROSC. Hospitalization complicated by rhabdomyolysis, encephalopathy, aspiration PNA,  ileus, acidosis, renal failure, hypotension, and liver shock. Neurology was consulted on 05/30/21 for finding of left LE weakness. CT spine negative. Is to have MRI spine and brain today, as he could not tolerate yesterday. Renal is starting HD today. Orthopaedics did not think his weakness and pain in left thigh is secondary to compartment syndrome, but likely due to rhabdo. His chart says encephalopathy, but he is more distracted and confused today than prior notes, so we will get MRI brain.   Recommendations/Plan:  -Get MRI T/L spine and MRI brain.  -Continue to follow CK, although it is decreasing.   Pt seen by Jimmye Norman, MSN, APN-BC/Nurse Practitioner/Neuro and later by MD. Note and plan to be edited as needed by MD.  Pager: [phone]  NEUROHOSPITALIST ADDENDUM Performed a face to face diagnostic evaluation.   I have reviewed the contents of history and physical exam as documented by PA/ARNP/Resident and agree with above documentation.  I have discussed and formulated the above plan as documented. Edits to the note have been made as needed.  Impression:/ey exam findings/Plan: Encephalopathic on exam today. Easily gets agitated and poor cooperation with exam due to pain. Wants to be left alone. His LLE is weak compared to right and L lateral thigh appears to be tighter than the right. Ortho did not feel he has compartment syndrome. His pedal pulses are palpable in LLE. Difficult to localize given poor participation with exam and encephalopathy. Plan to get MRI of his Brain and entire spine along with Left femur. Encephalopathy is likely 2/2 kidney dysfunction. He is to get dialysis after MRIs.  Plan discussed with Dr. Francine Graven in person.   Erick Blinks, MD [facility] [phone]   If 7pm to 7am, please call on call as listed on AMION.

## 2021-05-31 NOTE — Plan of Care (Signed)

## 2021-05-31 NOTE — Progress Notes (Signed)
Spoke w/ RN Jon Gills regarding d/cing CVC. She said she was aware and it would be done.

## 2021-05-31 NOTE — Progress Notes (Signed)
Subjective/Chief Complaint: Pt with some blood -tinged BMs yesterday Pt with improved abd pain this AM States leg pain is better as well   Objective: Vital signs in last 24 hours: Temp:  [97.5 F (36.4 C)-99.1 F (37.3 C)] 98.3 F (36.8 C) (01/22 0736) Pulse Rate:  [88-111] 90 (01/22 0530) Resp:  [15-30] 18 (01/22 0530) BP: (84-178)/(60-97) 84/60 (01/22 0530) SpO2:  [90 %-96 %] 90 % (01/22 0530) Weight:  [88.6 kg] 88.6 kg (01/22 0500) Last BM Date: 05/31/21  Intake/Output from previous day: 01/21 0701 - 01/22 0700 In: 598.6 [I.V.:398.6; IV Piggyback:200] Out: -  Intake/Output this shift: No intake/output data recorded.  PE:  Constitutional: No acute distress, conversant, appears states age. Eyes: Anicteric sclerae, moist conjunctiva, no lid lag Lungs: Clear to auscultation bilaterally, normal respiratory effort CV: regular rate and rhythm, no murmurs, no peripheral edema, pedal pulses 2+ GI: Soft, no masses or hepatosplenomegaly, min-tender to palpation RLQ Skin: No rashes, palpation reveals normal turgor Psychiatric: appropriate judgment and insight, oriented to person, place, and time   Lab Results:  Recent Labs    05/30/21 1625 05/30/21 2330  WBC 19.4* 19.5*  HGB 9.6* 8.7*  HCT 28.4* 25.9*  PLT 174 226   BMET Recent Labs    05/30/21 0340 05/31/21 0402  NA 132* 134*  K 3.7 4.5  CL 99 98  CO2 20* 16*  GLUCOSE 111* 113*  BUN 108* 163*  CREATININE 6.54* 8.59*  CALCIUM 7.7* 7.7*   PT/INR No results for input(s): LABPROT, INR in the last 72 hours. ABG No results for input(s): PHART, HCO3 in the last 72 hours.  Invalid input(s): PCO2, PO2  Studies/Results: CT ABDOMEN PELVIS WO CONTRAST  Result Date: 05/30/2021 CLINICAL DATA:  Abdominal pain. EXAM: CT ABDOMEN AND PELVIS WITHOUT CONTRAST TECHNIQUE: Multidetector CT imaging of the abdomen and pelvis was performed following the standard protocol without IV contrast. RADIATION DOSE REDUCTION: This  exam was performed according to the departmental dose-optimization program which includes automated exposure control, adjustment of the mA and/or kV according to patient size and/or use of iterative reconstruction technique. COMPARISON:  None FINDINGS: Lower chest: Signs of pneumomediastinum identified. Gas is seen tracking into bilateral cardio phrenic angles. There is also an subcutaneous gas identified along the ventral chest and ventral abdominal wall. Bilateral lower lobe airspace opacities are identified. There also scattered nodular densities noted within the lingula and right middle lobe. Hepatobiliary: No focal liver abnormality identified. Gallbladder appears unremarkable. Pancreas: Unremarkable. No pancreatic ductal dilatation or surrounding inflammatory changes. Spleen: Normal in size without focal abnormality. Adrenals/Urinary Tract: Normal adrenal glands. No nephrolithiasis, there is a punctate stone within the inferior pole of the left kidney. No hydronephrosis or mass identified bilaterally. Urinary bladder contains a small amount of gas. Stomach/Bowel: The stomach appears normal. There is abnormal bowel wall edema with surrounding inflammation involving the ileum and ascending colon tiny focus of gas noted within the wall of the edematous loop of bowel, image 61/6. There is diffuse surrounding fat stranding and a small volume of free fluid which extends into the pelvis. Mild generalized increase caliber of the small bowel loops proximal to the ileum noted which measure up to 2.9 cm. Vascular/Lymphatic: Normal appearance of the abdominal aorta. Within the limitations of unenhanced technique no signs of portal venous gas identified at this time no abdominopelvic adenopathy. Reproductive: Prostate is unremarkable. Other: Trace perihepatic ascites noted. There is also a small volume of free fluid noted within the pelvis. No discrete fluid  collections identified. Musculoskeletal: No acute or significant  osseous findings. There are nondisplaced fractures involving the costochondral cartilage of the anterior right fifth and sixth ribs which may is likely related to recent CPR. IMPRESSION: 1. Examination is positive for pneumomediastinum with subcutaneous gas identified along the ventral chest and ventral abdominal wall. 2. Bilateral lower lobe airspace opacities with scattered nodular densities noted within the lingula and right middle lobe. Findings are favored to represent multifocal pneumonia. 3. Abnormal bowel wall edema with surrounding inflammation involving the ileum and ascending colon. A tiny focus of gas noted within the wall of the edematous loop of bowel is concerning for small bowel ischemia. In the setting of recent cardiac arrest findings may reflect ischemic enteritis and colitis. 4. Mild generalized increase caliber of the small bowel loops proximal to the ileum which measure up to 2.9 cm. No signs of high-grade bowel obstruction. 5. Small volume of free fluid noted within the abdomen and pelvis. No discrete fluid collections identified to suggest abscess. 6. Nondisplaced fractures involving the costochondral cartilage of the anterior right fifth and sixth ribs which may is likely related to recent CPR. Electronically Signed   By: Kerby Moors M.D.   On: 05/30/2021 06:43   CT THORACIC SPINE WO CONTRAST  Result Date: 05/30/2021 CLINICAL DATA:  46 year old male with a history of cardiac arrest. Possible change in lower extremity movement/sensation. EXAM: CT THORACIC AND LUMBAR SPINE WITHOUT CONTRAST TECHNIQUE: Multidetector CT imaging of the thoracic and lumbar spine was performed without contrast. Multiplanar CT image reconstructions were also generated. RADIATION DOSE REDUCTION: This exam was performed according to the departmental dose-optimization program which includes automated exposure control, adjustment of the mA and/or kV according to patient size and/or use of iterative reconstruction  technique. COMPARISON:  Previous chest and abdominal radiographs. FINDINGS: CT THORACIC SPINE FINDINGS Limited cervical spine imaging: Cervicothoracic junction alignment is within normal limits. Thoracic spine segmentation: Normal, assuming unfused transverse process ossification centers at L1. Alignment:  Preserved thoracic kyphosis. No spondylolisthesis. Vertebrae: Bone mineralization is within normal limits. Thoracic vertebrae appear intact. No acute osseous abnormality identified. Paraspinal and other soft tissues: Scattered, but confluent peribronchial opacity in the right upper lobe. Right greater than left streaky and confluent bilateral lower lobe opacity. Trace if any associated pleural fluid. Visible major airways are patent. Calcified postinflammatory mediastinal and left hilar lymph nodes. No evidence of pericardial effusion. Negative visible noncontrast upper abdominal viscera. Thoracic paraspinal soft tissues appear normal. Disc levels: Mild for age thoracic spine degeneration. There is lower thoracic posterior element hypertrophy, including calcified ligament flavum hypertrophy bilaterally at T11-T12. However, no CT evidence of thoracic disc herniation or thoracic spinal stenosis. CT LUMBAR SPINE FINDINGS Segmentation: Normal, with bilateral L1 unfused transverse process ossification centers (normal variant). Alignment: Preserved lumbar lordosis. Vertebrae: No acute osseous abnormality identified. Bone mineralization within normal limits. Visible sacrum and SI joints appear intact. Paraspinal and other soft tissues: Moderate volume of pelvic free fluid, simple fluid density. There is a right lower extremity approach central venous catheter in place, terminates at the junction of the right internal and external iliac veins. Punctate nephrolithiasis. Partially visible probable ingested tooth within a small bowel loop in the right abdomen best seen on coronal image 10. No dilated bowel identified.  Otherwise negative visible noncontrast abdominal viscera. Lumbar paraspinal soft tissues appear negative. Disc levels: Mild for age lumbar spine degeneration. Lower lumbar disc bulging maximal at L4-L5. But only mild if any associated lumbar spinal stenosis. IMPRESSION: 1. No acute  osseous abnormality in the thoracic or lumbar spine. Mild for age thoracic and lumbar spine degeneration. No CT evidence of thoracic spinal stenosis. And only mild if any lumbar spinal stenosis related to disc bulging at L4-L5. 2. Right Upper Lobe Bronchopneumonia. Confluent lower lobe opacity could reflect lung collapse versus additional lower lobe pneumonia. Trace if any associated pleural fluid. 3. Moderate volume of pelvic free fluid, nonspecific although with simple fluid density favoring a transudate. 4. Ingested Tooth suspected in a right abdominal small bowel loop (series 5, image 10 of the lumbar study). 5. Nephrolithiasis. Electronically Signed   By: Genevie Ann M.D.   On: 05/30/2021 05:19   CT LUMBAR SPINE WO CONTRAST  Result Date: 05/30/2021 CLINICAL DATA:  46 year old male with a history of cardiac arrest. Possible change in lower extremity movement/sensation. EXAM: CT THORACIC AND LUMBAR SPINE WITHOUT CONTRAST TECHNIQUE: Multidetector CT imaging of the thoracic and lumbar spine was performed without contrast. Multiplanar CT image reconstructions were also generated. RADIATION DOSE REDUCTION: This exam was performed according to the departmental dose-optimization program which includes automated exposure control, adjustment of the mA and/or kV according to patient size and/or use of iterative reconstruction technique. COMPARISON:  Previous chest and abdominal radiographs. FINDINGS: CT THORACIC SPINE FINDINGS Limited cervical spine imaging: Cervicothoracic junction alignment is within normal limits. Thoracic spine segmentation: Normal, assuming unfused transverse process ossification centers at L1. Alignment:  Preserved  thoracic kyphosis. No spondylolisthesis. Vertebrae: Bone mineralization is within normal limits. Thoracic vertebrae appear intact. No acute osseous abnormality identified. Paraspinal and other soft tissues: Scattered, but confluent peribronchial opacity in the right upper lobe. Right greater than left streaky and confluent bilateral lower lobe opacity. Trace if any associated pleural fluid. Visible major airways are patent. Calcified postinflammatory mediastinal and left hilar lymph nodes. No evidence of pericardial effusion. Negative visible noncontrast upper abdominal viscera. Thoracic paraspinal soft tissues appear normal. Disc levels: Mild for age thoracic spine degeneration. There is lower thoracic posterior element hypertrophy, including calcified ligament flavum hypertrophy bilaterally at T11-T12. However, no CT evidence of thoracic disc herniation or thoracic spinal stenosis. CT LUMBAR SPINE FINDINGS Segmentation: Normal, with bilateral L1 unfused transverse process ossification centers (normal variant). Alignment: Preserved lumbar lordosis. Vertebrae: No acute osseous abnormality identified. Bone mineralization within normal limits. Visible sacrum and SI joints appear intact. Paraspinal and other soft tissues: Moderate volume of pelvic free fluid, simple fluid density. There is a right lower extremity approach central venous catheter in place, terminates at the junction of the right internal and external iliac veins. Punctate nephrolithiasis. Partially visible probable ingested tooth within a small bowel loop in the right abdomen best seen on coronal image 10. No dilated bowel identified. Otherwise negative visible noncontrast abdominal viscera. Lumbar paraspinal soft tissues appear negative. Disc levels: Mild for age lumbar spine degeneration. Lower lumbar disc bulging maximal at L4-L5. But only mild if any associated lumbar spinal stenosis. IMPRESSION: 1. No acute osseous abnormality in the thoracic or  lumbar spine. Mild for age thoracic and lumbar spine degeneration. No CT evidence of thoracic spinal stenosis. And only mild if any lumbar spinal stenosis related to disc bulging at L4-L5. 2. Right Upper Lobe Bronchopneumonia. Confluent lower lobe opacity could reflect lung collapse versus additional lower lobe pneumonia. Trace if any associated pleural fluid. 3. Moderate volume of pelvic free fluid, nonspecific although with simple fluid density favoring a transudate. 4. Ingested Tooth suspected in a right abdominal small bowel loop (series 5, image 10 of the lumbar study). 5. Nephrolithiasis.  Electronically Signed   By: Genevie Ann M.D.   On: 05/30/2021 05:19   DG Chest Port 1 View  Result Date: 05/30/2021 CLINICAL DATA:  History of cardiac arrest. EXAM: PORTABLE CHEST 1 VIEW COMPARISON:  Chest radiograph 05/28/2021 FINDINGS: Monitoring leads overlie the patient. Stable cardiac and mediastinal contours. Interval removal of left IJ central venous catheter and enteric tube as well as extubation. Persistent pneumomediastinum. Low lung volumes with bibasilar heterogeneous opacities. Redemonstrated lucency within the left supraclavicular fossa. IMPRESSION: Interval removal of enteric tube, ET tube and left IJ catheter. Persistent pneumomediastinum. Redemonstrated lucency left supraclavicular fossa which may represent additional gas tracking from the chest. Low lung volumes with bibasilar opacities favored to represent atelectasis. Electronically Signed   By: Lovey Newcomer M.D.   On: 05/30/2021 10:12   CT HEAD CODE STROKE WO CONTRAST  Result Date: 05/30/2021 CLINICAL DATA:  46 year old male with a history of cardiac arrest. Possible lower extremity weakness or change in sensation, but moving all extremities in the CT department, code stroke status canceled. EXAM: CT HEAD WITHOUT CONTRAST TECHNIQUE: Contiguous axial images were obtained from the base of the skull through the vertex without intravenous contrast.  RADIATION DOSE REDUCTION: This exam was performed according to the departmental dose-optimization program which includes automated exposure control, adjustment of the mA and/or kV according to patient size and/or use of iterative reconstruction technique. COMPARISON:  Head CT 03/24/2022. FINDINGS: Brain: Normal cerebral volume. No midline shift, ventriculomegaly, mass effect, evidence of mass lesion, intracranial hemorrhage or evidence of cortically based acute infarction. Gray-white matter differentiation is within normal limits throughout the brain. Mega cisterna magna, normal variant. Vascular: No suspicious intracranial vascular hyperdensity. Skull: Stable, intact. Sinuses/Orbits: Extubated now. Moderate posterior paranasal sinus mucosal thickening and bubbly opacity persists. Tympanic cavities and mastoids are clear. Other: Trace retained secretions in the visible pharynx. Negative orbit and scalp soft tissues. ASPECTS [facility] Stroke Program Early CT Score) Total score (0-10 with 10 being normal): 10 IMPRESSION: 1. Stable and normal noncontrast CT appearance of the brain. 2. Paranasal sinus opacification following intubation. Electronically Signed   By: Genevie Ann M.D.   On: 05/30/2021 04:54    Anti-infectives: Anti-infectives (From admission, onward)    Start     Dose/Rate Route Frequency Ordered Stop   05/30/21 0800  cefTRIAXone (ROCEPHIN) 2 g in sodium chloride 0.9 % 100 mL IVPB        2 g 200 mL/hr over 30 Minutes Intravenous Every 24 hours 05/30/21 0747     05/30/21 0800  metroNIDAZOLE (FLAGYL) IVPB 500 mg        500 mg 100 mL/hr over 60 Minutes Intravenous Every 12 hours 05/30/21 0747     05/26/21 1000  cefTRIAXone (ROCEPHIN) 2 g in sodium chloride 0.9 % 100 mL IVPB        2 g 200 mL/hr over 30 Minutes Intravenous Every 24 hours 05/25/21 1618 05/28/21 1008   05/24/21 1130  cefTRIAXone (ROCEPHIN) 1 g in sodium chloride 0.9 % 100 mL IVPB  Status:  Discontinued        1 g 200 mL/hr over 30  Minutes Intravenous Every 24 hours 05/24/21 1119 05/25/21 1618       Assessment/Plan: 47 year old male with questionable small bowel ischemia. Cardiac arrest Acute metabolic encephalopathy Acute renal failure secondary to rhabdomyolysis  Pt abd pain appears to be improving.  No concern for ongoing ischemia to the bowel.  Pt likely with some sloughing of the mucosa which is manifesting as bloody  stool.  May develop ileus. OK to place on full liquids. Will follow.   LOS: 7 days    Ralene Ok 05/31/2021

## 2021-05-31 NOTE — Progress Notes (Signed)
NAME:  Royd Dewing, MRN:  [id number], DOB:  11-15-1975, LOS: 7 ADMISSION DATE:  05/24/2021, CONSULTATION DATE:  05/31/21 REFERRING MD:  EDP, CHIEF COMPLAINT:  Cardiac arrest   History of Present Illness:  46 year old man admitted after cardiac arrest at home. PEA arrest, s/p CPR x 20 minutes and Epi x 5. Patient reportedly out drinking with his friends the night prior to admission.  Found by friends with agonal breathing.  Lost pulse. Improved neurologic status, extubated 1/19. Remains encephalopathic.  Pertinent Medical History:  Unknown  Significant Hospital Events: Including procedures, antibiotic start and stop dates in addition to other pertinent events   1/15:  20 minutes of CPR, 5 rounds of epinephrine.  ROSC was obtained.  Rhythm PEA per report.  Emesis noted all around him. On arrival to ED, severely acidemic pH 6.7.  Refractory acidemia.  Intubated. Difficult to ventilate.  Eventually requiring dose of paralytic after adequate sedation.  Labs notable for renal failure, elevated LFTs.  CK in the 14,000 range. Started on ceftriaxone for aspiration PNA, Nephro consulted. Started on CRRT.  CVL and HD cath placed.  1/16: hypotensive overnight. Having Anoxic myoclonus during evening hrs. Treated w/ Versed. Having difficulty w/ HD cath clotting due to Curved ports affecting flow.  EEG suggested diffuse cerebral dysfunction.  There is no epileptiform activity or seizures.  Right IJ dialysis catheter removed due to malfunction, right femoral dialysis catheter placed.  Social work working on family contacts.  Temperature management initiated to avoid fever 1/17: Hemodynamics a little better.  Sedation minimized.  Exam a little improved compared to 1/16 spontaneously moving left upper extremity to stimulus, more forceful cough, opens eyes.  Lower extremity pulses no longer palpable or audible via Doppler. IV heparin stared. Dopplers ordered  1/18: Hemodynamics continue to improve, decreasing  pressor requirements. Improving neuro exam, will weakly follow commands when asked in Spanish. Readily opens eyes to voice. BLE pedal pulses now +doppler. Cortrak placed. 1/19: Improved residuals s/p Cortrak, though emesis on placement. Emesis overnight. Minimal secretions. Mild agitation/gagging, waking up/intermittently following commands. Off of pressors. Extubated to [place]. 1/20: Awake, stable respiratory status on 4L [place]. Remains encephalopathic, oriented only to self/general location. Hypertensive, off of pressors. CRRT stopped, plan for HD.  Interim History / Subjective:   Patient' reports his abdominal pain and leg pain are better today. He feels like his left leg is asleep.   Denies nausea. Multiple bowel movements yesterday, hemoccult positive  Unable to obtain MRI spine scans yesterday as patient was moving too much  Orthopedics consulted yesterday and not concerned for compartment syndrome of lower extremities.  General Surgery consulted yesterday for concern of ischemic colitis  Objective:  Blood pressure (!) 84/60, pulse 90, temperature 98.3 F (36.8 C), temperature source Axillary, resp. rate 18, height 6' (1.829 m), weight 88.6 kg, SpO2 90 %.        Intake/Output Summary (Last 24 hours) at 05/31/2021 0724 Last data filed at 05/30/2021 1500 Gross per 24 hour  Intake 598.62 ml  Output --  Net 598.62 ml   Filed Weights   05/28/21 0459 05/29/21 0341 05/31/21 0500  Weight: 89.9 kg 86.6 kg 88.6 kg   Physical Examination: General: ill-appearing middle-aged man, no distress HEENT: /AT, anicteric sclera, PERRL, moist mucous membranes. Neuro: Awake, oriented x 1-2.Marland Kitchen Responds to verbal stimuli. Follows commands. Decreased strength of left lower extremity CV: rrr, no m/g/r. PULM: diminished at bases, no wheezing GI: Soft, nontender, distended. Active bowel sounds. Extremities: Bilateral symmetric trace  LE edema noted. Skin: Warm/dry, no rashes. R anterior shin with  ecchymosis/blister 2/2 prior IO.  Resolved Hospital Problem List:   Cold lower extremities with no palpable pulses Suspect at least to some extent due to shock state. Improved 1/18 with bilateral DP/PT pulses present with doppler. Mixed metabolic and respiratory acidosis Circulatory shock  Assessment & Plan:  Cardiac arrest - Continue telemetry - Supportive care  Acute Hypoxemic Respiratory Failure Pneumomediastinum Non-displaced fractures involving costochrondral cartilage of anterior right fifth and sixth ribs Due to aspiration pneumonia and subsequent cardiac arrest. - Continue supplemental O2 support, tolerating [place] s/p extubation - Wean O2 for sat > 90% - Pulmonary hygiene, incentive spirometer - Follow CXR - S/p Ceftriaxone x 5 days (end [id number])  Acute metabolic encephalopathy favoring postarrest anoxic encephalopathy. Initial CT brain negative, downtime in excess of 20 minutes, EEG suggesting fairly significant neurological dysfunction however exam today a little improved however overall still concerned about functional recovery - Minimize sedating medications as able - Communication with patient should be in Spanish (with interpreter) if at all possible  Ischemic Enteritis vs colitis Gastric ileus with high gastric output Cortrak placed 1/18, self-discontinued 1/19. Improved residuals with post-pyloric tube location. - Continue bowel regimen - No Reglan in the setting of prolonged Qtc - General surgery following for concern of ischemic colitis - Lactic acid not elevated - LR at 150mL/hr  Acute renal failure with oliguria Rhabdomyolysis - Nephrology following, appreciate assistance - Off of CRRT as of 1/19PM - tolerating iHD, scheduled again for today - Trend BMP - Replete electrolytes as indicated - Monitor I&Os, bladder scans Q shift to monitor for renal recovery - Avoid nephrotoxic agents as able - Ensure adequate renal perfusion - Will remove femoral line today  after dialysis and place IJ line vs have IR place tunneled line  Lower extremity Weakness Neurology evaluated - concern for sciatic nerve involvement vs compartment syndrome - CT imaging with no concerning findings for his symptoms - CK level trending down to 13K - Will check MRI of the thoracic and lumbar spine - Sedation for obtain scans: precedex, PRN fentanyl 128mcg and IV benadryl  Hemoccult positive stool In setting of ischemic colitis - PPI BID  Elevated liver enzymes:  T bili not Phos normal.  Suspect due to shock.  Likely related to rhabdomyolysis.   - Trend LFTs to normal, improving  R anterior knee wound - Rt prior IO placement - Appreciate WOC recs  Physical deconditioning - PT/OT/SLP as appropriate - Limited at present in the setting of femoral HD catheter - Plan to remove femoral line after HD today  Best Practice (right click and "Reselect all SmartList Selections" daily)   Diet/type: full liquids   DVT prophylaxis: prophylactic heparin  GI prophylaxis: PPI Lines: Dialysis Catheter Foley:  N/A Code Status:  full code Last date of multidisciplinary goals of care discussion: N/A. We have a contact number of a spouse - apparently separated from this woman; however, also has a cousin.  None of which who speak [place].  We will continue to try to reach out via interpreter.  Critical care time: 35 minutes    Freda Jackson, MD [facility] Office: [phone]   See Amion for personal pager PCCM on call pager [phone] until 7pm. Please call Elink 7p-7a. [phone]

## 2021-05-31 NOTE — Progress Notes (Signed)
R femoral HD catheter removed by this RN, per MD order from Dr. Francine Graven. ACT within normal range for line removal. No complications noted. VS as follows.     05/31/21 2145 05/31/21 2150 05/31/21 2155  Vitals  BP 120/74 123/71 128/77  MAP (mmHg) 88 86 93  Pulse Rate 98 96 98  ECG Heart Rate 98 97 98  Resp (!) 21 17 (!) 26  Oxygen Therapy  SpO2 98 % 95 % 95 %    05/31/21 2200 05/31/21 2205  Vitals  BP 130/72 123/75  MAP (mmHg) 88 89  Pulse Rate 99 94  ECG Heart Rate 99 94  Resp (!) 26 (!) 22  Oxygen Therapy  SpO2 95 % 96 %

## 2021-05-31 NOTE — Progress Notes (Signed)
Able to complete ordered MRIs of the Brain, Csp, Tsp, and Lsp with and without contrast. Unable to obtain ordered femur imaging at this time due to highly frequent leg motion. Consulted with Radiologist Dr Chestine Spore. Rad advised ok to give contrast for Brain, Csp, Tsp, and Lsp images and potentially attempt femur at another time due to motion sensitivity. Obtained best possible images for completed exams.

## 2021-05-31 NOTE — Progress Notes (Signed)
[facility] Progress Note   Subjective:   I/Os yesterday 600 / 0.  Remains extubated and hemodynamically stable.  Did not have HD yesterday due to timing around MRI which ended up not occurring due to agitation -- plan to reattempt MRI this AM then HD after.  Gen surgery consulting re: possible ischemic colitis, neuro and ortho consulting re: LE deficits  Objective Vitals:   05/31/21 0600 05/31/21 0700 05/31/21 0736 05/31/21 0800  BP: (!) 81/59 105/65  119/78  Pulse: 84 82  83  Resp: 18 18  (!) 21  Temp:   98.3 F (36.8 C)   TempSrc:   Oral   SpO2: 90% 91%  93%  Weight:      Height:       Physical Exam General: awake and alert Heart: RRR, no rub Lungs: coarse BL Abdomen: soft, mildly distended Extremities: 1+ edema, feet less mottled, calves warm, R knee dressing in place Neuro:  awake and alert, oriented to self and [place], cannot wiggle L toes or dorsiflex, R can move toes Dialysis Access:  RIJ fem HD catheter   Additional Objective Labs: Basic Metabolic Panel: Recent Labs  Lab 05/29/21 0253 05/30/21 0340 05/31/21 0402  NA 137 132* 134*  K 3.8 3.7 4.5  CL 103 99 98  CO2 25 20* 16*  GLUCOSE 115* 111* 113*  BUN 55* 108* 163*  CREATININE 4.23* 6.54* 8.59*  CALCIUM 8.0* 7.7* 7.7*  PHOS 2.3* 4.0 6.1*    Liver Function Tests: Recent Labs  Lab 05/24/21 1406 05/25/21 0416 05/26/21 0916 05/26/21 1600 05/28/21 0347 05/28/21 1615 05/29/21 0253 05/30/21 0340 05/31/21 0402  AST 2,052*  --  3,852*  --  1,193*  --   --   --   --   ALT 1,832*  --  1,756*  --  896*  --   --   --   --   ALKPHOS 103  --  86  --  110  --   --   --   --   BILITOT 0.4  --  0.7  --  0.6  --   --   --   --   PROT 4.4*  --  5.0*  --  5.6*  --   --   --   --   ALBUMIN 2.4*   < > 2.2*   < > 2.0*   < > 1.9* 1.9* 1.7*   < > = values in this interval not displayed.    No results for input(s): LIPASE, AMYLASE in the last 168 hours. CBC: Recent Labs  Lab 05/28/21 0347  05/29/21 0253 05/30/21 0340 05/30/21 1625 05/30/21 2330  WBC 11.9* 13.3* 16.7* 19.4* 19.5*  HGB 10.5* 9.5* 9.9* 9.6* 8.7*  HCT 31.1* 29.3* 29.4* 28.4* 25.9*  MCV 85.9 85.7 83.5 82.6 83.5  PLT 93* 98* 145* 174 226    Blood Culture No results found for: SDES, SPECREQUEST, CULT, REPTSTATUS  Cardiac Enzymes: Recent Labs  Lab 05/24/21 0932 05/25/21 0416 05/30/21 0340  CKTOTAL 14,071* >50,000* 13,372*    CBG: Recent Labs  Lab 05/30/21 1546 05/30/21 1921 05/30/21 2326 05/31/21 0353 05/31/21 0734  GLUCAP 86 93 103* 193* 137*    Iron Studies: No results for input(s): IRON, TIBC, TRANSFERRIN, FERRITIN in the last 72 hours. @lablastinr3 @ Studies/Results: CT ABDOMEN PELVIS WO CONTRAST  Result Date: 05/30/2021 CLINICAL DATA:  Abdominal pain. EXAM: CT ABDOMEN AND PELVIS WITHOUT CONTRAST TECHNIQUE: Multidetector CT imaging of the abdomen and pelvis was performed  following the standard protocol without IV contrast. RADIATION DOSE REDUCTION: This exam was performed according to the departmental dose-optimization program which includes automated exposure control, adjustment of the mA and/or kV according to patient size and/or use of iterative reconstruction technique. COMPARISON:  None FINDINGS: Lower chest: Signs of pneumomediastinum identified. Gas is seen tracking into bilateral cardio phrenic angles. There is also an subcutaneous gas identified along the ventral chest and ventral abdominal wall. Bilateral lower lobe airspace opacities are identified. There also scattered nodular densities noted within the lingula and right middle lobe. Hepatobiliary: No focal liver abnormality identified. Gallbladder appears unremarkable. Pancreas: Unremarkable. No pancreatic ductal dilatation or surrounding inflammatory changes. Spleen: Normal in size without focal abnormality. Adrenals/Urinary Tract: Normal adrenal glands. No nephrolithiasis, there is a punctate stone within the inferior pole of the left  kidney. No hydronephrosis or mass identified bilaterally. Urinary bladder contains a small amount of gas. Stomach/Bowel: The stomach appears normal. There is abnormal bowel wall edema with surrounding inflammation involving the ileum and ascending colon tiny focus of gas noted within the wall of the edematous loop of bowel, image 61/6. There is diffuse surrounding fat stranding and a small volume of free fluid which extends into the pelvis. Mild generalized increase caliber of the small bowel loops proximal to the ileum noted which measure up to 2.9 cm. Vascular/Lymphatic: Normal appearance of the abdominal aorta. Within the limitations of unenhanced technique no signs of portal venous gas identified at this time no abdominopelvic adenopathy. Reproductive: Prostate is unremarkable. Other: Trace perihepatic ascites noted. There is also a small volume of free fluid noted within the pelvis. No discrete fluid collections identified. Musculoskeletal: No acute or significant osseous findings. There are nondisplaced fractures involving the costochondral cartilage of the anterior right fifth and sixth ribs which may is likely related to recent CPR. IMPRESSION: 1. Examination is positive for pneumomediastinum with subcutaneous gas identified along the ventral chest and ventral abdominal wall. 2. Bilateral lower lobe airspace opacities with scattered nodular densities noted within the lingula and right middle lobe. Findings are favored to represent multifocal pneumonia. 3. Abnormal bowel wall edema with surrounding inflammation involving the ileum and ascending colon. A tiny focus of gas noted within the wall of the edematous loop of bowel is concerning for small bowel ischemia. In the setting of recent cardiac arrest findings may reflect ischemic enteritis and colitis. 4. Mild generalized increase caliber of the small bowel loops proximal to the ileum which measure up to 2.9 cm. No signs of high-grade bowel obstruction. 5.  Small volume of free fluid noted within the abdomen and pelvis. No discrete fluid collections identified to suggest abscess. 6. Nondisplaced fractures involving the costochondral cartilage of the anterior right fifth and sixth ribs which may is likely related to recent CPR. Electronically Signed   By: Kerby Moors M.D.   On: 05/30/2021 06:43   CT THORACIC SPINE WO CONTRAST  Result Date: 05/30/2021 CLINICAL DATA:  46 year old male with a history of cardiac arrest. Possible change in lower extremity movement/sensation. EXAM: CT THORACIC AND LUMBAR SPINE WITHOUT CONTRAST TECHNIQUE: Multidetector CT imaging of the thoracic and lumbar spine was performed without contrast. Multiplanar CT image reconstructions were also generated. RADIATION DOSE REDUCTION: This exam was performed according to the departmental dose-optimization program which includes automated exposure control, adjustment of the mA and/or kV according to patient size and/or use of iterative reconstruction technique. COMPARISON:  Previous chest and abdominal radiographs. FINDINGS: CT THORACIC SPINE FINDINGS Limited cervical spine imaging: Cervicothoracic junction  alignment is within normal limits. Thoracic spine segmentation: Normal, assuming unfused transverse process ossification centers at L1. Alignment:  Preserved thoracic kyphosis. No spondylolisthesis. Vertebrae: Bone mineralization is within normal limits. Thoracic vertebrae appear intact. No acute osseous abnormality identified. Paraspinal and other soft tissues: Scattered, but confluent peribronchial opacity in the right upper lobe. Right greater than left streaky and confluent bilateral lower lobe opacity. Trace if any associated pleural fluid. Visible major airways are patent. Calcified postinflammatory mediastinal and left hilar lymph nodes. No evidence of pericardial effusion. Negative visible noncontrast upper abdominal viscera. Thoracic paraspinal soft tissues appear normal. Disc levels:  Mild for age thoracic spine degeneration. There is lower thoracic posterior element hypertrophy, including calcified ligament flavum hypertrophy bilaterally at T11-T12. However, no CT evidence of thoracic disc herniation or thoracic spinal stenosis. CT LUMBAR SPINE FINDINGS Segmentation: Normal, with bilateral L1 unfused transverse process ossification centers (normal variant). Alignment: Preserved lumbar lordosis. Vertebrae: No acute osseous abnormality identified. Bone mineralization within normal limits. Visible sacrum and SI joints appear intact. Paraspinal and other soft tissues: Moderate volume of pelvic free fluid, simple fluid density. There is a right lower extremity approach central venous catheter in place, terminates at the junction of the right internal and external iliac veins. Punctate nephrolithiasis. Partially visible probable ingested tooth within a small bowel loop in the right abdomen best seen on coronal image 10. No dilated bowel identified. Otherwise negative visible noncontrast abdominal viscera. Lumbar paraspinal soft tissues appear negative. Disc levels: Mild for age lumbar spine degeneration. Lower lumbar disc bulging maximal at L4-L5. But only mild if any associated lumbar spinal stenosis. IMPRESSION: 1. No acute osseous abnormality in the thoracic or lumbar spine. Mild for age thoracic and lumbar spine degeneration. No CT evidence of thoracic spinal stenosis. And only mild if any lumbar spinal stenosis related to disc bulging at L4-L5. 2. Right Upper Lobe Bronchopneumonia. Confluent lower lobe opacity could reflect lung collapse versus additional lower lobe pneumonia. Trace if any associated pleural fluid. 3. Moderate volume of pelvic free fluid, nonspecific although with simple fluid density favoring a transudate. 4. Ingested Tooth suspected in a right abdominal small bowel loop (series 5, image 10 of the lumbar study). 5. Nephrolithiasis. Electronically Signed   By: Genevie Ann M.D.   On:  05/30/2021 05:19   CT LUMBAR SPINE WO CONTRAST  Result Date: 05/30/2021 CLINICAL DATA:  46 year old male with a history of cardiac arrest. Possible change in lower extremity movement/sensation. EXAM: CT THORACIC AND LUMBAR SPINE WITHOUT CONTRAST TECHNIQUE: Multidetector CT imaging of the thoracic and lumbar spine was performed without contrast. Multiplanar CT image reconstructions were also generated. RADIATION DOSE REDUCTION: This exam was performed according to the departmental dose-optimization program which includes automated exposure control, adjustment of the mA and/or kV according to patient size and/or use of iterative reconstruction technique. COMPARISON:  Previous chest and abdominal radiographs. FINDINGS: CT THORACIC SPINE FINDINGS Limited cervical spine imaging: Cervicothoracic junction alignment is within normal limits. Thoracic spine segmentation: Normal, assuming unfused transverse process ossification centers at L1. Alignment:  Preserved thoracic kyphosis. No spondylolisthesis. Vertebrae: Bone mineralization is within normal limits. Thoracic vertebrae appear intact. No acute osseous abnormality identified. Paraspinal and other soft tissues: Scattered, but confluent peribronchial opacity in the right upper lobe. Right greater than left streaky and confluent bilateral lower lobe opacity. Trace if any associated pleural fluid. Visible major airways are patent. Calcified postinflammatory mediastinal and left hilar lymph nodes. No evidence of pericardial effusion. Negative visible noncontrast upper abdominal viscera. Thoracic paraspinal  soft tissues appear normal. Disc levels: Mild for age thoracic spine degeneration. There is lower thoracic posterior element hypertrophy, including calcified ligament flavum hypertrophy bilaterally at T11-T12. However, no CT evidence of thoracic disc herniation or thoracic spinal stenosis. CT LUMBAR SPINE FINDINGS Segmentation: Normal, with bilateral L1 unfused  transverse process ossification centers (normal variant). Alignment: Preserved lumbar lordosis. Vertebrae: No acute osseous abnormality identified. Bone mineralization within normal limits. Visible sacrum and SI joints appear intact. Paraspinal and other soft tissues: Moderate volume of pelvic free fluid, simple fluid density. There is a right lower extremity approach central venous catheter in place, terminates at the junction of the right internal and external iliac veins. Punctate nephrolithiasis. Partially visible probable ingested tooth within a small bowel loop in the right abdomen best seen on coronal image 10. No dilated bowel identified. Otherwise negative visible noncontrast abdominal viscera. Lumbar paraspinal soft tissues appear negative. Disc levels: Mild for age lumbar spine degeneration. Lower lumbar disc bulging maximal at L4-L5. But only mild if any associated lumbar spinal stenosis. IMPRESSION: 1. No acute osseous abnormality in the thoracic or lumbar spine. Mild for age thoracic and lumbar spine degeneration. No CT evidence of thoracic spinal stenosis. And only mild if any lumbar spinal stenosis related to disc bulging at L4-L5. 2. Right Upper Lobe Bronchopneumonia. Confluent lower lobe opacity could reflect lung collapse versus additional lower lobe pneumonia. Trace if any associated pleural fluid. 3. Moderate volume of pelvic free fluid, nonspecific although with simple fluid density favoring a transudate. 4. Ingested Tooth suspected in a right abdominal small bowel loop (series 5, image 10 of the lumbar study). 5. Nephrolithiasis. Electronically Signed   By: Genevie Ann M.D.   On: 05/30/2021 05:19   DG Chest Port 1 View  Result Date: 05/30/2021 CLINICAL DATA:  History of cardiac arrest. EXAM: PORTABLE CHEST 1 VIEW COMPARISON:  Chest radiograph 05/28/2021 FINDINGS: Monitoring leads overlie the patient. Stable cardiac and mediastinal contours. Interval removal of left IJ central venous catheter  and enteric tube as well as extubation. Persistent pneumomediastinum. Low lung volumes with bibasilar heterogeneous opacities. Redemonstrated lucency within the left supraclavicular fossa. IMPRESSION: Interval removal of enteric tube, ET tube and left IJ catheter. Persistent pneumomediastinum. Redemonstrated lucency left supraclavicular fossa which may represent additional gas tracking from the chest. Low lung volumes with bibasilar opacities favored to represent atelectasis. Electronically Signed   By: Lovey Newcomer M.D.   On: 05/30/2021 10:12   CT HEAD CODE STROKE WO CONTRAST  Result Date: 05/30/2021 CLINICAL DATA:  46 year old male with a history of cardiac arrest. Possible lower extremity weakness or change in sensation, but moving all extremities in the CT department, code stroke status canceled. EXAM: CT HEAD WITHOUT CONTRAST TECHNIQUE: Contiguous axial images were obtained from the base of the skull through the vertex without intravenous contrast. RADIATION DOSE REDUCTION: This exam was performed according to the departmental dose-optimization program which includes automated exposure control, adjustment of the mA and/or kV according to patient size and/or use of iterative reconstruction technique. COMPARISON:  Head CT 03/24/2022. FINDINGS: Brain: Normal cerebral volume. No midline shift, ventriculomegaly, mass effect, evidence of mass lesion, intracranial hemorrhage or evidence of cortically based acute infarction. Gray-white matter differentiation is within normal limits throughout the brain. Mega cisterna magna, normal variant. Vascular: No suspicious intracranial vascular hyperdensity. Skull: Stable, intact. Sinuses/Orbits: Extubated now. Moderate posterior paranasal sinus mucosal thickening and bubbly opacity persists. Tympanic cavities and mastoids are clear. Other: Trace retained secretions in the visible pharynx. Negative orbit and  scalp soft tissues. ASPECTS [facility] Stroke Program Early CT Score)  Total score (0-10 with 10 being normal): 10 IMPRESSION: 1. Stable and normal noncontrast CT appearance of the brain. 2. Paranasal sinus opacification following intubation. Electronically Signed   By: Genevie Ann M.D.   On: 05/30/2021 04:54   Medications:  sodium chloride Stopped (05/29/21 1602)   sodium chloride     sodium chloride     sodium chloride     sodium chloride Stopped (05/30/21 1023)   cefTRIAXone (ROCEPHIN)  IV 2 g (05/31/21 0800)   dexmedetomidine (PRECEDEX) IV infusion 0.7 mcg/kg/hr (05/31/21 [id number])   lactated ringers 100 mL/hr at 05/30/21 1500   metronidazole 500 mg (05/31/21 0909)    B-complex with vitamin C  1 tablet Per Tube Daily   Chlorhexidine Gluconate Cloth  6 each Topical Q0600   Chlorhexidine Gluconate Cloth  6 each Topical Q0600   diphenhydrAMINE  50 mg Intravenous Once   docusate  100 mg Per Tube BID   feeding supplement  237 mL Oral BID BM   folic acid  1 mg Per Tube Daily   free water  30 mL Per Tube Q4H   heparin injection (subcutaneous)  5,000 Units Subcutaneous Q8H   insulin aspart  2-6 Units Subcutaneous Q4H   mouth rinse  15 mL Mouth Rinse BID   pantoprazole (PROTONIX) IV  40 mg Intravenous Q12H   polyethylene glycol  17 g Per Tube BID   thiamine  100 mg Per Tube Daily    Assessment/ Plan: AKI severe: in setting of cardiac arrest, rhabdo.  Oliguric.  Required CRRT 1/15 - 1/19.  Anuric despite high dose lasix 1/20.  HD today via femoral HD catheter. Should change catheter to IJ TDC soon.  Cont supportive care.  Avoid hypotension, nephrotoxins.  SP cardiac arrest - asystolic arrest in setting of prob asp PNA/ ARDS in setting of EtOH Severe metabolic/ resp acidosis -   improved with vent and CRRT.   AHRF - suspected aspiration pneumonitis w/ ARDS, on antibiotics per primary.  Now on [place] Hepatitis - shock, EtOH, rhabdo contributors; per primary AMS - improving but remains encephalopathic.   LE neurologic deficits - ortho and neuro seeing, imaging pending  today Possible ischemic bowel - surgery consulting.    Will continue to follow - call with concerns  Jannifer Hick MD 05/31/2021, 9:21 AM  [facility] Pager: [phone]

## 2021-06-01 ENCOUNTER — Encounter (HOSPITAL_COMMUNITY)
Admission: EM | Disposition: A | Discharge status: Home / Self Care | Payer: Self-pay | Source: Home / Self Care | Attending: Internal Medicine

## 2021-06-01 ENCOUNTER — Inpatient Hospital Stay (HOSPITAL_COMMUNITY): Payer: Self-pay

## 2021-06-01 HISTORY — PX: INSERTION OF DIALYSIS CATHETER: SHX1324

## 2021-06-01 LAB — CBC
HCT: 25.7 % — ABNORMAL LOW (ref 39.0–52.0)
Hemoglobin: 8.8 g/dL — ABNORMAL LOW (ref 13.0–17.0)
MCH: 27.8 pg (ref 26.0–34.0)
MCHC: 34.2 g/dL (ref 30.0–36.0)
MCV: 81.1 fL (ref 80.0–100.0)
Platelets: 293 10*3/uL (ref 150–400)
RBC: 3.17 MIL/uL — ABNORMAL LOW (ref 4.22–5.81)
RDW: 13.9 % (ref 11.5–15.5)
WBC: 18.8 10*3/uL — ABNORMAL HIGH (ref 4.0–10.5)
nRBC: 0 % (ref 0.0–0.2)

## 2021-06-01 LAB — RENAL FUNCTION PANEL
Albumin: 2.1 g/dL — ABNORMAL LOW (ref 3.5–5.0)
Anion gap: 16 — ABNORMAL HIGH (ref 5–15)
BUN: 136 mg/dL — ABNORMAL HIGH (ref 6–20)
CO2: 18 mmol/L — ABNORMAL LOW (ref 22–32)
Calcium: 7.7 mg/dL — ABNORMAL LOW (ref 8.9–10.3)
Chloride: 99 mmol/L (ref 98–111)
Creatinine, Ser: 7.31 mg/dL — ABNORMAL HIGH (ref 0.61–1.24)
GFR, Estimated: 9 mL/min — ABNORMAL LOW (ref >60)
Glucose, Bld: 99 mg/dL (ref 70–99)
Phosphorus: 6.1 mg/dL — ABNORMAL HIGH (ref 2.5–4.6)
Potassium: 3.8 mmol/L (ref 3.5–5.1)
Sodium: 133 mmol/L — ABNORMAL LOW (ref 135–145)

## 2021-06-01 LAB — LACTIC ACID, PLASMA
Lactic Acid, Venous: 1 mmol/L (ref 0.5–1.9)
Lactic Acid, Venous: 1.1 mmol/L (ref 0.5–1.9)

## 2021-06-01 LAB — MAGNESIUM: Magnesium: 2.8 mg/dL — ABNORMAL HIGH (ref 1.7–2.4)

## 2021-06-01 IMAGING — DX DG CHEST 1V PORT
1 series · 1 of 1 positions shown · non-contrast
Comparison: [DATE]

CLINICAL DATA: Placement of central venous catheter

EXAM:
PORTABLE CHEST 1 VIEW

[chest]
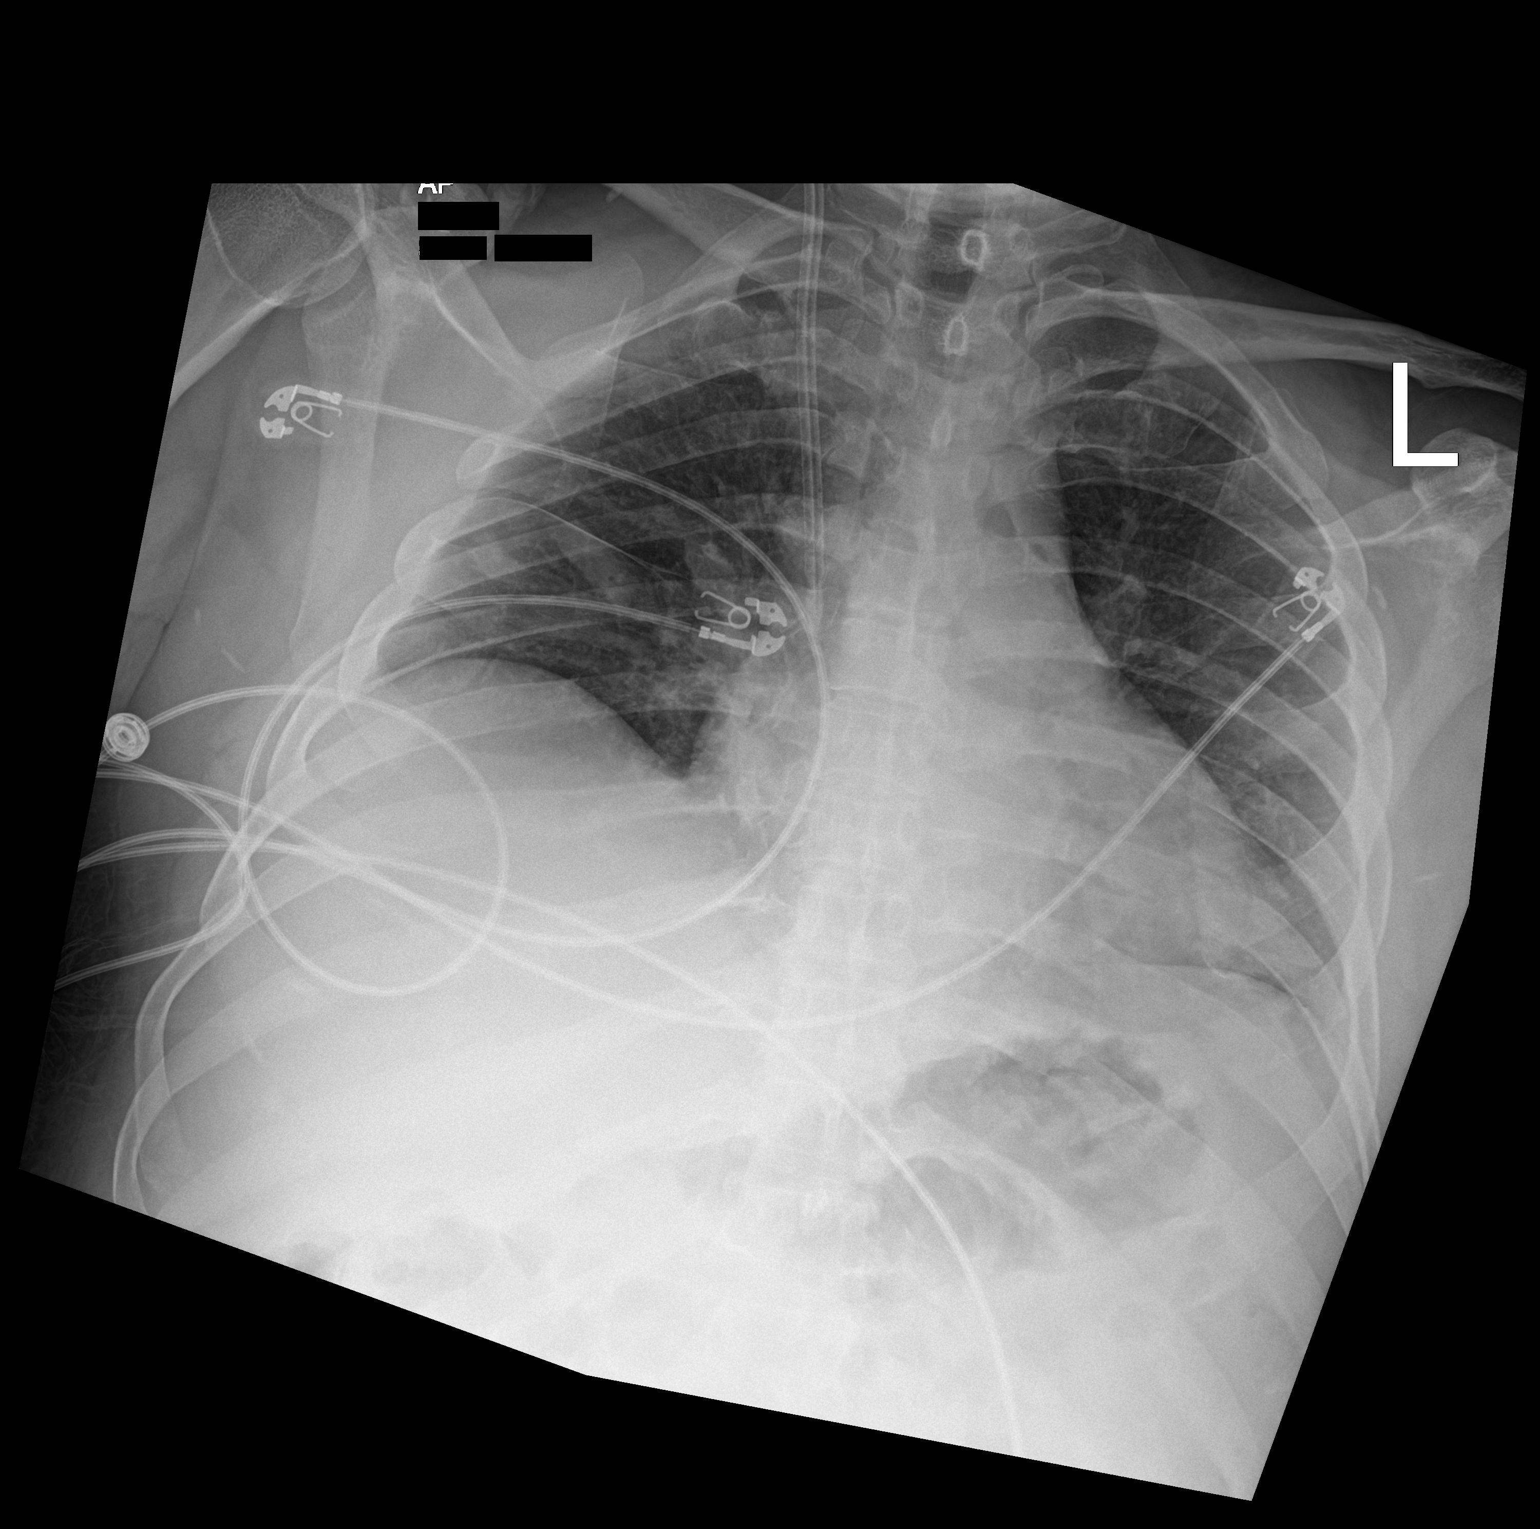

[1 of 1 positions shown; findings below may reference images not displayed]

FINDINGS: Transverse diameter of heart is increased. There is interval
decrease in pulmonary vascular congestion and pulmonary edema. There
is poor inspiration. There are no new focal infiltrates. There is
interval placement of large caliber right IJ central venous catheter
with its tip in the superior vena cava close to the right atrium.
There is no significant pleural effusion or pneumothorax.
IMPRESSION: There is interval decrease in pulmonary vascular congestion and
pulmonary edema. There is no pneumothorax. Tip of right IJ central
venous catheter is seen in the superior vena cava close to the right
atrium.

## 2021-06-01 IMAGING — CT CT ABD-PELV W/O CM
2 of 4 series · 15 of 46 positions shown, 17 images · non-contrast
Comparison: CT 2 days ago.

CLINICAL DATA: Abdominal pain, acute, nonlocalized. Cardiac arrest
8 days ago. Renal failure secondary to rhabdomyolysis.



[Series 3: abd/ pelvis 5.0 i30f 2 · axial · 0.82mm/px · z∈[+797,+1237]mm · 12 of 102 slices shown, 14 images]
[im 9/102  soft-tissue]
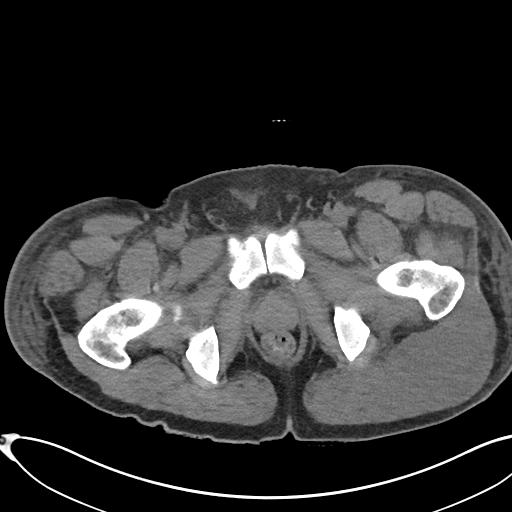
[im 9/102  bone]
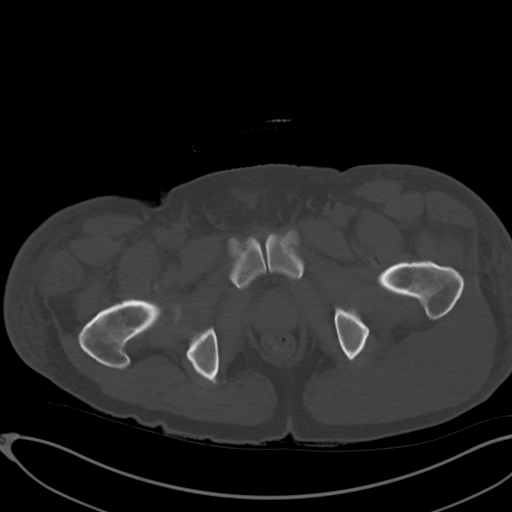
[im 17/102  soft-tissue]
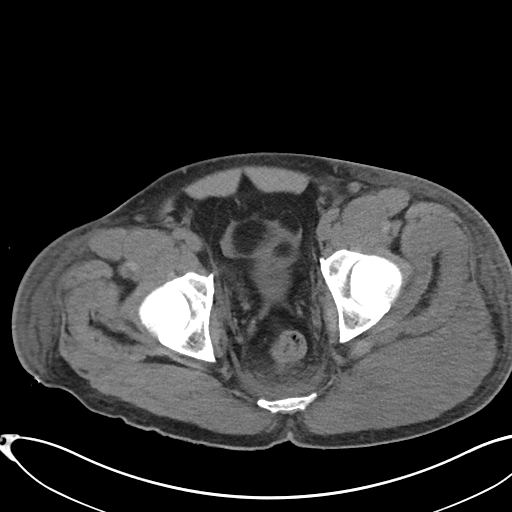
[im 25/102  soft-tissue]
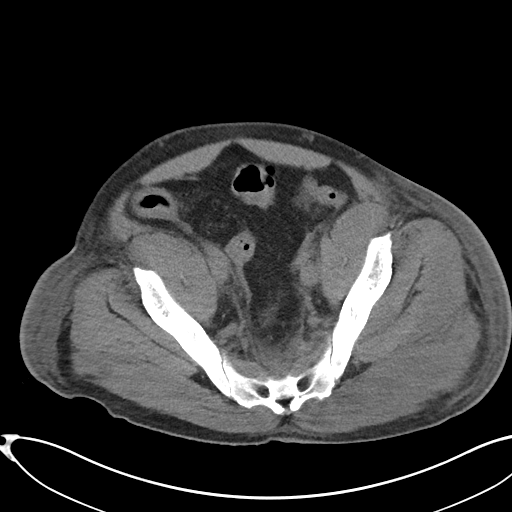
[im 33/102  soft-tissue]
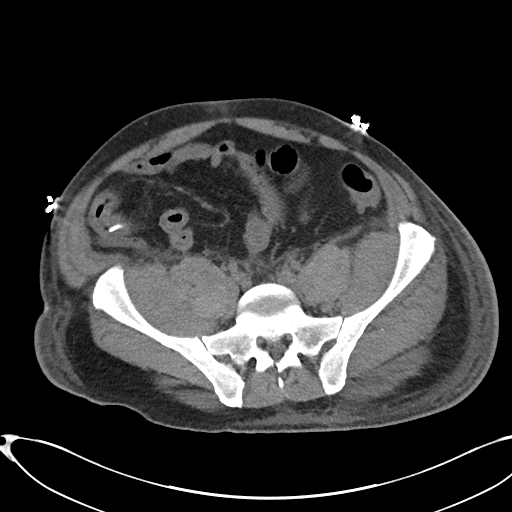
[im 41/102  soft-tissue]
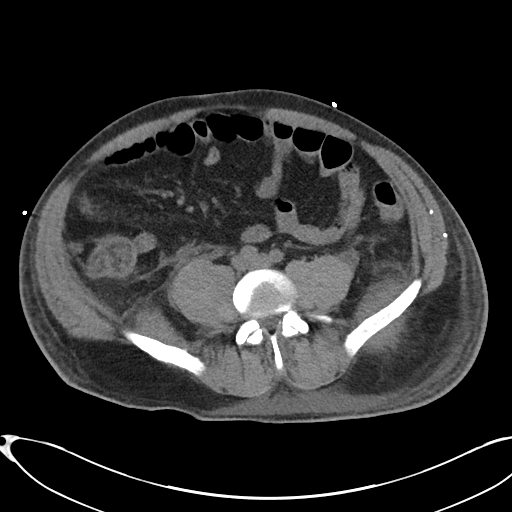
[im 49/102  soft-tissue]
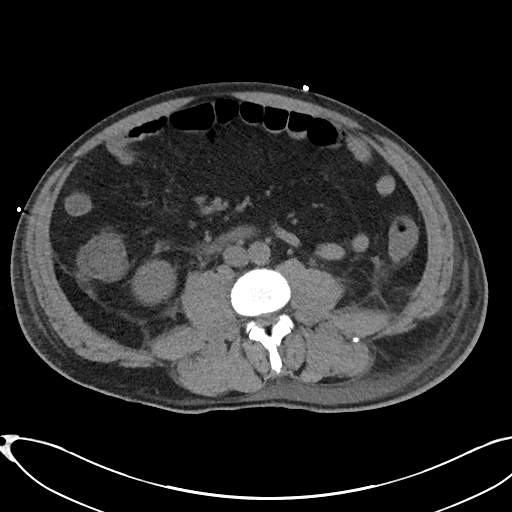
[im 57/102  soft-tissue]
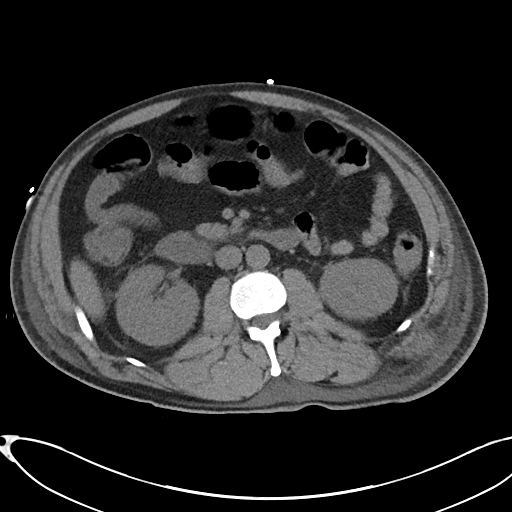
[im 65/102  soft-tissue]
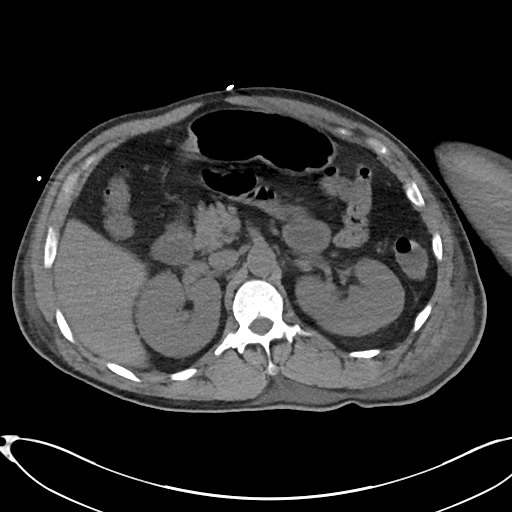
[im 73/102  soft-tissue]
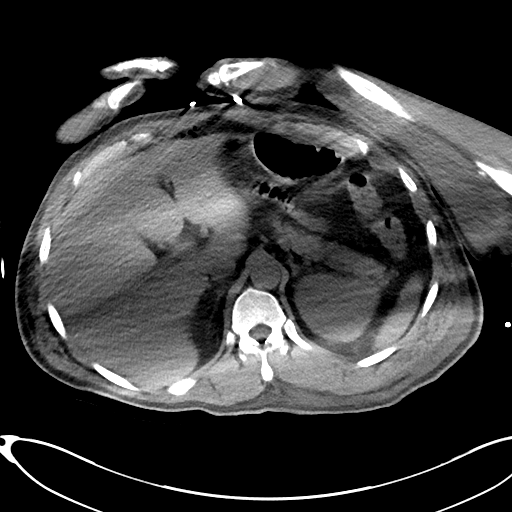
[im 73/102  bone]
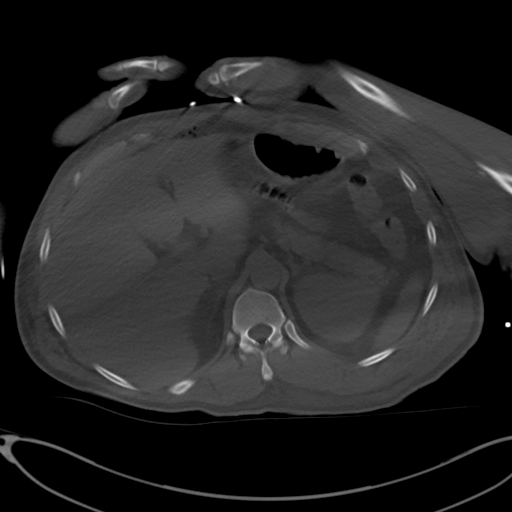
[im 81/102  soft-tissue]
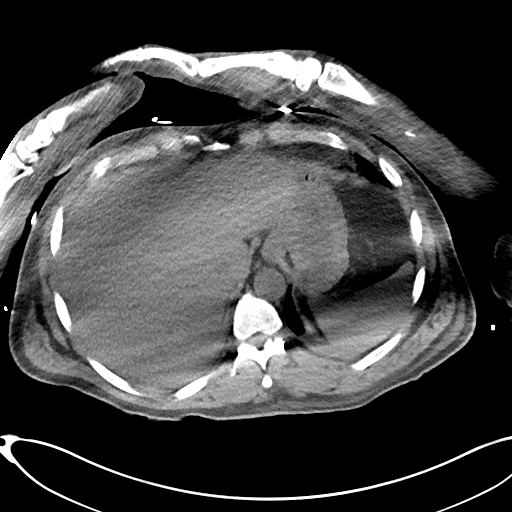
[im 89/102  soft-tissue]
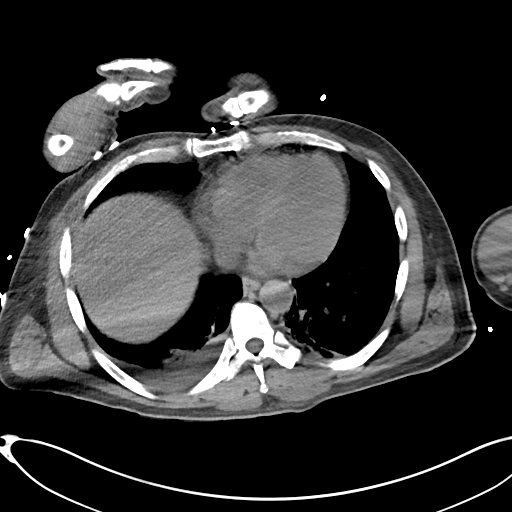
[im 97/102  soft-tissue]
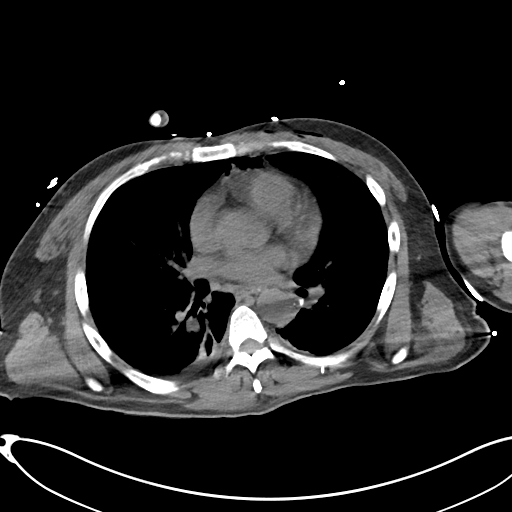

[Series 6: cor st · coronal · 0.87mm/px · 3 of 122 slices shown]
[im 41/122  soft-tissue]
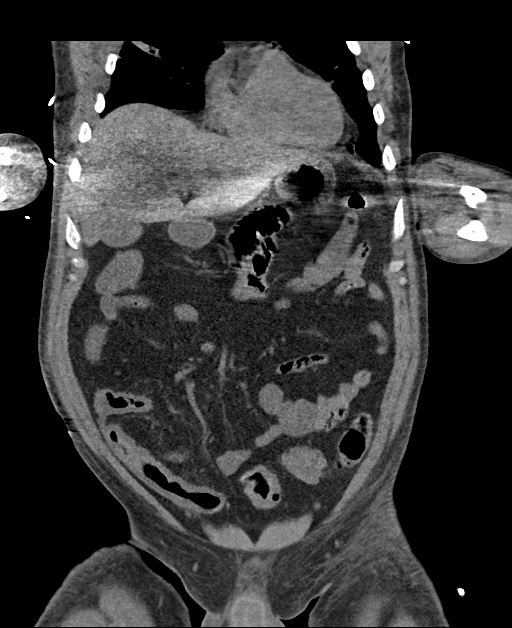
[im 54/122  soft-tissue]
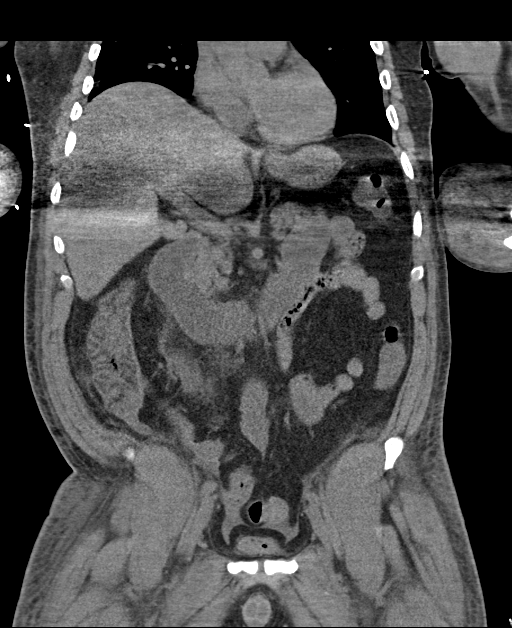
[im 68/122  soft-tissue]
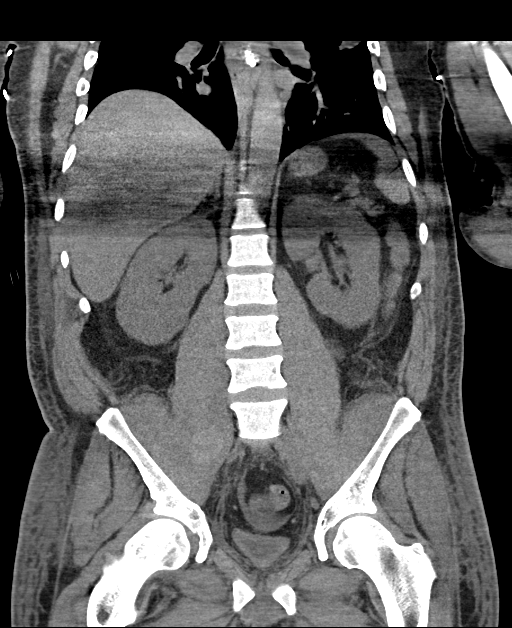

[15 of 46 positions shown; findings below may reference images not displayed]

FINDINGS: Lower chest: Small right effusion layering dependently. Patchy
peripheral areas of parenchymal density in both lower lobes appear
similar to the previous study and could represent infectious
pneumonia or pulmonary infarctions. Calcified subcarinal lymph nodes
as seen previously. Pneumomediastinum and chest wall air as seen
previously, possibly subsequent to previous resuscitation. No
progressive or worsening finding.

Hepatobiliary: Liver parenchyma appears normal without contrast. No
calcified gallstones.

Pancreas: Normal

Spleen: Normal

Adrenals/Urinary Tract: Adrenal glands are normal. The kidneys
appear enlarged consistent with acute nephritis. No evidence of
obstruction or focal lesion. Bladder appears unremarkable, nearly
empty. Small amount of air in the bladder as seen previously.

Stomach/Bowel: Stomach appears normal. Question wall thickening in a
segmental region of the ileum, possibly ischemic insult with this
clinical history. No evidence of frank necrosis. No dilated small
bowel. Appendix is normal. There may be mild edema of the right
colon as well. Mild diverticulosis of the left colon.

Vascular/Lymphatic: Aorta and IVC are normal. No adenopathy. Mild
nonspecific retroperitoneal edema, often seen in critically ill
patients lying supine.

Reproductive: Normal

Other: Tiny amount of free intraperitoneal fluid. Apparent muscular
swelling of the iliopsoas and gluteal muscles that could go along
with the clinical history of rhabdomyolysis. There may be some
subtle hyperdensity within the right ileo psoas musculature
consistent with some intramuscular hemorrhage. No large hematoma.
IMPRESSION: Redemonstration of patchy bilateral peripheral lower lobe airspace
density which could be due to pneumonia or pulmonary infarctions. No
progressive change.

Redemonstration of pneumomediastinum and soft tissue air within the
anterior chest, presumably subsequent to resuscitation. No visible
pneumothorax in the region studied.

Redemonstration of bowel wall edema the distal ileum and possibly
the proximal ascending colon, possibly ischemic in this setting. No
progressive change or evidence of frank bowel necrosis.

Enlarged kidneys consistent with the history of acute renal failure.
No sign of obstruction.

Muscular swelling, particularly of the ileo psoas and gluteal
muscles consistent with the clinical history of rhabdomyolysis. Some
hemorrhage noted within the right iliopsoas muscle, which I would
not categorize as a frank or significant hematoma at this time.

## 2021-06-01 SURGERY — INSERTION OF DIALYSIS CATHETER
Anesthesia: LOCAL | Laterality: Right

## 2021-06-01 MED ORDER — CHLORHEXIDINE GLUCONATE CLOTH 2 % EX PADS
6.0000 | MEDICATED_PAD | Freq: Every day | CUTANEOUS | Status: DC
Start: 2021-06-02 — End: 2021-06-11
  Administered 2021-06-02 – 2021-06-10 (×5): 6 via TOPICAL

## 2021-06-01 MED ORDER — HYDROMORPHONE HCL 1 MG/ML IJ SOLN
0.5000 mg | Freq: Once | INTRAMUSCULAR | Status: AC
  Administered 2021-06-01: 0.5 mg via INTRAVENOUS
  Filled 2021-06-01: qty 0.5

## 2021-06-01 MED ORDER — DARBEPOETIN ALFA 100 MCG/0.5ML IJ SOSY
100.0000 ug | PREFILLED_SYRINGE | INTRAMUSCULAR | Status: DC
  Administered 2021-06-01 – 2021-06-08 (×2): 100 ug via SUBCUTANEOUS
  Filled 2021-06-01 (×5): qty 0.5

## 2021-06-01 NOTE — Progress Notes (Signed)
Subjective: CC: Abdominal pain  Nursing notes reviewed from overnight. Chart reviewed. Spoke with RN at bedside. Spanish interpreter used.   Patient reports that he is having more abdominal pain since yesterday after trying water and milk in the morning. Since that time he has been having generalized burning abdominal pain with associated nausea. Pain is currently an 8/10. He reports he is hungry but did not eat anything since yesterday morning since po intake made his symptoms worse. Denies emesis to me but there is an episode of emesis documented in his chart. He reports he was passing flatus yesterday. No flatus this am. He has 3 bm's documented yesterday and RN reports there was a dime sized clot in the last before transfer to 2C. Patient's bp noted to be low yesterday between 1700-1830 w/ systolic pressures between 74-90. RN reports this was during HD yesterday where they removed 1L. BP improved this am with last bp 151/78. HR between 100-115 this am. He does not think his pain was worse during/after dialysis. Abd xray this am with some non specific small bowl gas pattern with mildly distended sb loops measuring up to 3.7 cm in diameter.There is a relative paucity of colonic and rectal gas. No frank pneumoperitoneum. Last pain medication was at 0541, Dilaudid 0.5mg . No UOP documented yesterday.   WBC remains elevated at 18.8. Last lactic acid 1.2 on 1/21.   MRI yesterday showed a R psoas fluid collection. Also complains of generalized weakness. LLE feels numb.   Objective: Vital signs in last 24 hours: Temp:  [97.3 F (36.3 C)-99.2 F (37.3 C)] 98.5 F (36.9 C) (01/23 0746) Pulse Rate:  [78-115] 115 (01/23 0746) Resp:  [12-29] 27 (01/23 0746) BP: (74-153)/(52-87) 151/87 (01/23 0746) SpO2:  [86 %-100 %] 95 % (01/23 0746) Weight:  [87.7 kg-89.6 kg] 89.6 kg (01/23 0240) Last BM Date: 05/31/21  Intake/Output from previous day: 01/22 0701 - 01/23 0700 In: 1575.4 [P.O.:370;  I.V.:902.7; IV Piggyback:302.8] Out: 1000  Intake/Output this shift: No intake/output data recorded.  PE: Gen:  Alert, NAD, pleasant HEENT: EOM's intact, pupils equal and round Card:  Tachycardic with regular rhythm Pulm:  CTAB, no W/R/R, effort normal Abd: Moderately distended with generalized tenderness but still remains soft without rebound, rigidity or guarding. Hypoactive bowel sounds.  Psych: A&Ox3  Msk: BUE and BLE edema noted. I can palpate R PT and L DP and calves feel soft.  Skin: no rashes noted, warm and dry  Lab Results:  Recent Labs    05/31/21 1544 06/01/21 0125  WBC 18.3* 18.8*  HGB 8.2* 8.8*  HCT 24.2* 25.7*  PLT 248 293   BMET Recent Labs    05/31/21 0402 06/01/21 0125  NA 134* 133*  K 4.5 3.8  CL 98 99  CO2 16* 18*  GLUCOSE 113* 99  BUN 163* 136*  CREATININE 8.59* 7.31*  CALCIUM 7.7* 7.7*   PT/INR No results for input(s): LABPROT, INR in the last 72 hours. CMP     Component Value Date/Time   NA 133 (L) 06/01/2021 0125   K 3.8 06/01/2021 0125   CL 99 06/01/2021 0125   CO2 18 (L) 06/01/2021 0125   GLUCOSE 99 06/01/2021 0125   BUN 136 (H) 06/01/2021 0125   CREATININE 7.31 (H) 06/01/2021 0125   CALCIUM 7.7 (L) 06/01/2021 0125   PROT 5.6 (L) 05/28/2021 0347   ALBUMIN 2.1 (L) 06/01/2021 0125   AST 1,193 (H) 05/28/2021 0347   ALT 896 (H)  05/28/2021 0347   ALKPHOS 110 05/28/2021 0347   BILITOT 0.6 05/28/2021 0347   GFRNONAA 9 (L) 06/01/2021 0125   Lipase  No results found for: LIPASE  Studies/Results: DG Abd 1 View  Result Date: 06/01/2021 CLINICAL DATA:  46 year old male with history of acute abdominal pain. Bright red blood in stool since Friday. EXAM: ABDOMEN - 1 VIEW COMPARISON:  Abdominal radiograph 05/28/2021. FINDINGS: There are several gas-filled loops of small bowel throughout the abdomen, most evident in the upper abdomen, where some of these bowel loops are mildly distended measuring up to 3.7 cm in diameter. There is a relative  paucity of colonic and rectal gas. No frank pneumoperitoneum. IMPRESSION: 1. Nonspecific bowel-gas pattern, as above, which could be indicative of early or partial small bowel obstruction. 2. No pneumoperitoneum. Electronically Signed   By: Trudie Reed M.D.   On: 06/01/2021 06:37   MR BRAIN W WO CONTRAST  Result Date: 05/31/2021 CLINICAL DATA:  Acute neuro deficit. Rule out stroke. Respiratory arrest. Leukocytosis. EXAM: MRI HEAD WITHOUT AND WITH CONTRAST TECHNIQUE: Multiplanar, multiecho pulse sequences of the brain and surrounding structures were obtained without and with intravenous contrast. CONTRAST:  8.53mL GADAVIST GADOBUTROL 1 MMOL/ML IV SOLN COMPARISON:  CT and 05/30/2021 FINDINGS: Brain: No acute infarction, hemorrhage, hydrocephalus, extra-axial collection or mass lesion. Normal white matter. Normal enhancement postcontrast infusion. Vascular: Normal arterial flow voids. Skull and upper cervical spine: Negative Sinuses/Orbits: Mucosal edema paranasal sinuses. Near complete opacification left sphenoid sinus with air-fluid level. Other: Motion degraded study IMPRESSION: Motion degraded exam.  Normal MRI of the brain with contrast. Mucosal edema paranasal sinuses with air-fluid level left sphenoid sinus Electronically Signed   By: Marlan Palau M.D.   On: 05/31/2021 16:32   MR CERVICAL SPINE W WO CONTRAST  Result Date: 05/31/2021 CLINICAL DATA:  Rule out CSF leak. Spontaneous intracranial hypotension. Cardiac arrest. Leukocytosis. EXAM: MRI CERVICAL SPINE WITHOUT AND WITH CONTRAST TECHNIQUE: Multiplanar and multiecho pulse sequences of the cervical spine, to include the craniocervical junction and cervicothoracic junction, were obtained without and with intravenous contrast. CONTRAST:  8.69mL GADAVIST GADOBUTROL 1 MMOL/ML IV SOLN COMPARISON:  None. FINDINGS: Alignment: Normal Vertebrae: Normal bone marrow. Negative for fracture or mass. No evidence of spinal infection. Cord: Normal signal  morphology. Posterior Fossa, vertebral arteries, paraspinal tissues: Mega cisterna magna. No soft tissue fluid collection in the cervical spine. Disc levels: Small right paracentral disc protrusion at C5-6 without significant stenosis. Remaining disc spaces normal. Image quality degraded by motion IMPRESSION: Motion degraded study. Small right-sided disc protrusion at C5-6 Negative for spinal infection or fluid collection in the cervical spine. Electronically Signed   By: Marlan Palau M.D.   On: 05/31/2021 16:35   MR THORACIC SPINE W WO CONTRAST  Result Date: 05/31/2021 CLINICAL DATA:  Acute myelopathy.  Cardiac arrest.  Leukocytosis EXAM: MRI THORACIC WITHOUT AND WITH CONTRAST TECHNIQUE: Multiplanar and multiecho pulse sequences of the thoracic spine were obtained without and with intravenous contrast. CONTRAST:  8.25mL GADAVIST GADOBUTROL 1 MMOL/ML IV SOLN COMPARISON:  CT thoracic spine 05/30/2021 FINDINGS: Alignment:  Normal Vertebrae: Normal bone marrow. Negative for fracture or mass. No evidence of thoracic spine infection. Cord: Normal signal and morphology. No cord compression. Cord signal evaluation is limited by motion. Paraspinal and other soft tissues: Small right pleural effusion. No paraspinous mass or edema. Disc levels: There is significant disc degeneration. No thoracic spinal stenosis. IMPRESSION: Motion degraded study.  No significant abnormality thoracic spine. Electronically Signed   By: Leonette Most  Chestine Spore M.D.   On: 05/31/2021 16:29   MR Lumbar Spine W Wo Contrast  Result Date: 05/31/2021 CLINICAL DATA:  Acute myelopathy.  Leukocytosis.  Cardiac arrest  . EXAM: MRI LUMBAR SPINE WITHOUT AND WITH CONTRAST TECHNIQUE: Multiplanar and multiecho pulse sequences of the lumbar spine were obtained without and with intravenous contrast. CONTRAST:  8.79mL GADAVIST GADOBUTROL 1 MMOL/ML IV SOLN COMPARISON:  CT lumbar spine 05/30/2021 FINDINGS: Segmentation:  5 lumbar vertebra. Alignment:  Normal  Vertebrae: Negative for fracture or mass. No evidence of discitis osteomyelitis. Conus medullaris and cauda equina: Conus extends to the L1-2 level. Conus and cauda equina appear normal. Paraspinal and other soft tissues: Enlargement of the right iliopsoas muscles. Complex fluid collection right psoas muscle measuring 3.2 cm. The fluid collection is hypointense on T2 and T1. Smaller adjacent fluid collection is seen inferior to the larger collection. There is enhancement of the right iliopsoas muscle around the fluid collections. Mild stranding in the tissues surrounding the muscles. Disc levels: L1-2: Negative L2-3: Negative L3-4: Small central disc protrusion and bilateral facet degeneration. Mild subarticular stenosis bilaterally L4-5: Disc bulging and facet degeneration.  Negative for stenosis L5-S1: Mild disc bulging.  Negative for stenosis. Image quality degraded by motion. IMPRESSION: Multiple fluid collections in the right ileo psoas muscle. The largest collection is 3.2 cm. There is surrounding enhancement and enlargement of the muscles. Findings most likely due to hematoma or abscess. The patient has had a right femoral vascular catheter. No evidence of discitis osteomyelitis in the lumbar spine. Motion degraded study. Electronically Signed   By: Marlan Palau M.D.   On: 05/31/2021 16:22    Anti-infectives: Anti-infectives (From admission, onward)    Start     Dose/Rate Route Frequency Ordered Stop   05/30/21 0800  cefTRIAXone (ROCEPHIN) 2 g in sodium chloride 0.9 % 100 mL IVPB        2 g 200 mL/hr over 30 Minutes Intravenous Every 24 hours 05/30/21 0747     05/30/21 0800  metroNIDAZOLE (FLAGYL) IVPB 500 mg        500 mg 100 mL/hr over 60 Minutes Intravenous Every 12 hours 05/30/21 0747     05/26/21 1000  cefTRIAXone (ROCEPHIN) 2 g in sodium chloride 0.9 % 100 mL IVPB        2 g 200 mL/hr over 30 Minutes Intravenous Every 24 hours 05/25/21 1618 05/28/21 1008   05/24/21 1130  cefTRIAXone  (ROCEPHIN) 1 g in sodium chloride 0.9 % 100 mL IVPB  Status:  Discontinued        1 g 200 mL/hr over 30 Minutes Intravenous Every 24 hours 05/24/21 1119 05/25/21 1618        Assessment/Plan 46 year old male with questionable small bowel ischemia. - I am concerned about the patient's worsening abdominal pain overnight. He is slightly tachycardic this am and wbc remains elevated, however this could be 2/2 to other etiologies. His abdomen is slightly distended with global tenderness but is soft without peritonitis. Discussed with MD. Will plan to repeat a lactic on him (was wnl on 1/21) and non-contrasted CT A/P (given underlying acute renal failure we will do this without IV contrast). If he would require an operative intervention he would have significant M/M. We will follow along closely. Keep NPO  FEN - NPO, IVF per CCM VTE - SCDs, okay for chemical prophylaxis from our standpoint ID - Rocephin/Flagyl Foley -  Plan -    Cardiac arrest s/p CPR Asp PNA Acute metabolic encephalopathy Acute renal  failure secondary to rhabdomyolysis with oliguria  Rhabdo  Etoh use R ileo psoas fluid collection   High Medical Decision Making   LOS: 8 days    Jacinto HalimMichael M Jaiveon Suppes , [facility] 06/01/2021, 8:11 AM Please see Amion for pager number during day hours 7:00am-4:30pm

## 2021-06-01 NOTE — Procedures (Signed)
Central Venous Catheter Insertion Procedure Note  Andres Braun  [id number]  03-11-1976  Date:06/01/21  Time:3:21 PM   Provider Performing:Kyri Dai Salena Saner Katrinka Blazing   Procedure: Insertion of Non-tunneled Central Venous [phone]) with US guidance ([id number])   Indication(s) Hemodialysis  Consent Risks of the procedure as well as the alternatives and risks of each were explained to the patient and/or caregiver.  Consent for the procedure was obtained and is signed in the bedside chart  Anesthesia Topical only with 1% lidocaine   Timeout Verified patient identification, verified procedure, site/side was marked, verified correct patient position, special equipment/implants available, medications/allergies/relevant history reviewed, required imaging and test results available.  Sterile Technique Maximal sterile technique including full sterile barrier drape, hand hygiene, sterile gown, sterile gloves, mask, hair covering, sterile ultrasound probe cover (if used).  Procedure Description Area of catheter insertion was cleaned with chlorhexidine and draped in sterile fashion.  With real-time ultrasound guidance a HD catheter was placed into the right internal jugular vein. Nonpulsatile blood flow and easy flushing noted in all ports.  The catheter was sutured in place and sterile dressing applied.  Complications/Tolerance None; patient tolerated the procedure well. Chest X-ray is ordered to verify placement for internal jugular or subclavian cannulation.   Chest x-ray is not ordered for femoral cannulation.  EBL Minimal  Specimen(s) None

## 2021-06-01 NOTE — Progress Notes (Addendum)
Called [facility] x2 trying to get pain meds for pt. Pt in room moaning and groaning, stating he has intermittent abdominal pain and cramping. Spoke with Werner Lean, RN who informed this RN that they have known about his pain and they "scanned him from head to toe" and nothing is wrong. WCTM patient.   7106 Secure chatted Dr. Cresenciano Lick for pain meds for the patient. Message was read but no response. Followed up with another message at 0440, message was read but still no response.   2694 Called [facility] back again and spoke with Ernie Hew, RN. He stated he would text the MD. Still awaiting orders.   0520 Pain meds ordered from MD. Pt still moaning and groaning in pain. Will medicate after pharmacy approves med.   0645 Pt finally resting comfortably, NAD, fall precautions in place, WCTM.

## 2021-06-01 NOTE — Plan of Care (Signed)
°  Problem: Education: Goal: Knowledge of General Education information will improve Description: Including pain rating scale, medication(s)/side effects and non-pharmacologic comfort measures Outcome: Not Progressing   Problem: Health Behavior/Discharge Planning: Goal: Ability to manage health-related needs will improve Outcome: Not Progressing   Problem: Clinical Measurements: Goal: Ability to maintain clinical measurements within normal limits will improve Outcome: Not Progressing Goal: Will remain free from infection Outcome: Progressing Goal: Diagnostic test results will improve Outcome: Not Progressing Goal: Respiratory complications will improve Outcome: Progressing Goal: Cardiovascular complication will be avoided Outcome: Progressing   Problem: Activity: Goal: Risk for activity intolerance will decrease Outcome: Not Progressing

## 2021-06-01 NOTE — Progress Notes (Signed)
Physical Therapy Treatment Patient Details Name: Andres Braun MRN: [id number] DOB: June 21, 1975 Today's Date: 06/01/2021   History of Present Illness 46 y.o. man admitted 1/15 after cardiac arrest at home. Pt with agonal he breathing, lost pulse, 20 minutes of CPR, 5 rounds of epinephrine,acute respiratory failure with hypoxia and hypercapnia, septic shock due to undetermined organism, AKI,   acute metabolic encephalopathy,  aspiration pneumonia of both lower lobes due to vomit. PMH - unknown    PT Comments    The pt was agreeable to session, but reports increased abdominal pain and pain in BLE due to "heaviness" at this time. He presents with some improvement in AROM of BUE, but reports "I cannot" when asked to move either LE in any direction. The pt requires max-totalA to complete movement in the bed for cleaning and to transition to sitting EOB. He demos poor-absent core strength, dependent on BUE support and trunk support to prevent posterior or R lateral loss of balance. He continues to present with significant bilateral LE edema, and reports no sensation in LLE other than pain in his L hip. Will continue to benefit from maximal therapies acutely to reduce burden of care at time of d/c.    Recommendations for follow up therapy are one component of a multi-disciplinary discharge planning process, led by the attending physician.  Recommendations may be updated based on patient status, additional functional criteria and insurance authorization.  Follow Up Recommendations  Acute inpatient rehab (3hours/day)     Assistance Recommended at Discharge Frequent or constant Supervision/Assistance  Patient can return home with the following Two people to help with walking and/or transfers;Help with stairs or ramp for entrance   Equipment Recommendations  Wheelchair (measurements PT);Wheelchair cushion (measurements PT)    Recommendations for Other Services       Precautions / Restrictions  Precautions Precautions: Fall Restrictions Weight Bearing Restrictions: No     Mobility  Bed Mobility Overal bed mobility: Needs Assistance Bed Mobility: Supine to Sit, Sit to Supine     Supine to sit: Total assist, +2 for physical assistance, HOB elevated Sit to supine: Total assist, +2 for physical assistance   General bed mobility comments: Assist for almost all aspects coming to sitting (did attempt to move his RLE some and reach for rail with LUE). Assist to bring both legs up into bed and to control descent of trunk    Transfers                   General transfer comment: deferred due to increased swelling and weakness in BLE at this time.        Balance Overall balance assessment: Needs assistance Sitting-balance support: Single extremity supported, Feet supported Sitting balance-Leahy Scale: Poor Sitting balance - Comments: pt unable to maintain with either single UE or BUE support alone, max-totalA to maintain static sitting EOB Postural control: Posterior lean, Right lateral lean                                  Cognition Arousal/Alertness: Awake/alert Behavior During Therapy: Flat affect Overall Cognitive Status: Impaired/Different from baseline Area of Impairment: Orientation, Following commands, Attention, Safety/judgement, Awareness, Problem solving                 Orientation Level: Disoriented to, Time, Place, Situation Current Attention Level: Sustained Memory: Decreased short-term memory Following Commands: Follows one step commands inconsistently, Follows one step commands with  increased time Safety/Judgement: Decreased awareness of safety, Decreased awareness of deficits Awareness: Intellectual Problem Solving: Slow processing, Decreased initiation, Difficulty sequencing, Requires verbal cues, Requires tactile cues General Comments: perservating on wanting water/eat               Pertinent Vitals/Pain Pain  Assessment Pain Assessment: Faces Faces Pain Scale: Hurts even more Pain Location: left hip with attempt for left lateral leans while sitting EOB Pain Descriptors / Indicators: Grimacing, Guarding Pain Intervention(s): Limited activity within patient's tolerance, Monitored during session, Repositioned     PT Goals (current goals can now be found in the care plan section) Acute Rehab PT Goals PT Goal Formulation: Patient unable to participate in goal setting Time For Goal Achievement: 06/12/21 Potential to Achieve Goals: Fair Progress towards PT goals: Progressing toward goals    Frequency    Min 3X/week      PT Plan Current plan remains appropriate    Co-evaluation PT/OT/SLP Co-Evaluation/Treatment: Yes Reason for Co-Treatment: Complexity of the patient's impairments (multi-system involvement);Necessary to address cognition/behavior during functional activity;To address functional/ADL transfers;For patient/therapist safety PT goals addressed during session: Mobility/safety with mobility;Balance;Strengthening/ROM OT goals addressed during session: Strengthening/ROM      AM-PAC PT "6 Clicks" Mobility   Outcome Measure  Help needed turning from your back to your side while in a flat bed without using bedrails?: A Lot Help needed moving from lying on your back to sitting on the side of a flat bed without using bedrails?: Total Help needed moving to and from a bed to a chair (including a wheelchair)?: Total Help needed standing up from a chair using your arms (e.g., wheelchair or bedside chair)?: Total Help needed to walk in hospital room?: Total Help needed climbing 3-5 steps with a railing? : Total 6 Click Score: 7    End of Session Equipment Utilized During Treatment: Gait belt;Oxygen Activity Tolerance: Patient tolerated treatment well Patient left: in bed;with call bell/phone within reach;with bed alarm set Nurse Communication: Mobility status PT Visit Diagnosis:  Other abnormalities of gait and mobility (R26.89);Muscle weakness (generalized) (M62.81);Difficulty in walking, not elsewhere classified (R26.2)     Time: [id number] PT Time Calculation (min) (ACUTE ONLY): 43 min  Charges:  $Therapeutic Activity: 8-22 mins                     West Carbo, PT, DPT   Acute Rehabilitation Department Pager #: [phone]   Sandra Cockayne 06/01/2021, 2:17 PM

## 2021-06-01 NOTE — Progress Notes (Addendum)
NAME:  Andres Braun, MRN:  [id number], DOB:  1976-02-12, LOS: 8 ADMISSION DATE:  05/24/2021, CONSULTATION DATE:  06/01/21 REFERRING MD:  EDP, CHIEF COMPLAINT:  Cardiac arrest   History of Present Illness:  46 year old man admitted after cardiac arrest at home. PEA arrest, s/p CPR x 20 minutes and Epi x 5. Patient reportedly out drinking with his friends the night prior to admission.  Found by friends with agonal breathing.  Lost pulse. Improved neurologic status, extubated 1/19. Remains encephalopathic.  Pertinent Medical History:  Unknown  Significant Hospital Events: Including procedures, antibiotic start and stop dates in addition to other pertinent events   1/15:  20 minutes of CPR, 5 rounds of epinephrine.  ROSC was obtained.  Rhythm PEA per report.  Emesis noted all around him. On arrival to ED, severely acidemic pH 6.7.  Refractory acidemia.  Intubated. Difficult to ventilate.  Eventually requiring dose of paralytic after adequate sedation.  Labs notable for renal failure, elevated LFTs.  CK in the 14,000 range. Started on ceftriaxone for aspiration PNA, Nephro consulted. Started on CRRT.  CVL and HD cath placed.  1/16: hypotensive overnight. Having Anoxic myoclonus during evening hrs. Treated w/ Versed. Having difficulty w/ HD cath clotting due to Curved ports affecting flow.  EEG suggested diffuse cerebral dysfunction.  There is no epileptiform activity or seizures.  Right IJ dialysis catheter removed due to malfunction, right femoral dialysis catheter placed.  Social work working on family contacts.  Temperature management initiated to avoid fever 1/17: Hemodynamics a little better.  Sedation minimized.  Exam a little improved compared to 1/16 spontaneously moving left upper extremity to stimulus, more forceful cough, opens eyes.  Lower extremity pulses no longer palpable or audible via Doppler. IV heparin stared. Dopplers ordered  1/18: Hemodynamics continue to improve, decreasing  pressor requirements. Improving neuro exam, will weakly follow commands when asked in Spanish. Readily opens eyes to voice. BLE pedal pulses now +doppler. Cortrak placed. 1/19: Improved residuals s/p Cortrak, though emesis on placement. Emesis overnight. Minimal secretions. Mild agitation/gagging, waking up/intermittently following commands. Off of pressors. Extubated to [employer]. 1/20: Awake, stable respiratory status on 4L [place]. Remains encephalopathic, oriented only to self/general location. Hypertensive, off of pressors. CRRT stopped, plan for HD. Transferred to telemetry  Interim History / Subjective:   Continues to have some abdominal pain and leg pain No nausea CT today Was seen by renal and surgery  Objective:  Blood pressure 127/85, pulse (!) 110, temperature 98.7 F (37.1 C), temperature source Oral, resp. rate (!) 27, height 6' (1.829 m), weight 89.6 kg, SpO2 95 %.        Intake/Output Summary (Last 24 hours) at 06/01/2021 1155 Last data filed at 06/01/2021 0546 Gross per 24 hour  Intake 1575.42 ml  Output 1000 ml  Net 575.42 ml   Filed Weights   05/31/21 1624 05/31/21 1858 06/01/21 0240  Weight: 88.6 kg 87.7 kg 89.6 kg   Physical Examination: General: Ill-appearing middle-age gentleman HEENT: Moist oral mucosa Neuro: Awake, oriented x 1-2.Marland Kitchen Responds to verbal stimuli. Follows commands. Decreased strength of left lower extremity CV: S1-S2 appreciated PULM: diminished at bases, no wheezing GI: Soft, bowel sounds appreciated, distended Extremities: Bilateral lower extremity edema Skin: Skin is warm and dry  Resolved Hospital Problem List:   Cold lower extremities with no palpable pulses Suspect at least to some extent due to shock state. Improved 1/18 with bilateral DP/PT pulses present with doppler. Mixed metabolic and respiratory acidosis Circulatory shock  Assessment &  Plan:  Cardiac arrest -Telemetry monitoring -Supportive care  Acute hypoxemic respiratory  failure Pneumomediastinum Nondisplaced fractures involving costochondral cartilage of anterior right fifth and sixth ribs Aspiration pneumonia -Currently on room air -Continue pulmonary toileting -Follow chest x-ray -Completed ceftriaxone  Acute metabolic encephalopathy -CT negative initially -Downtime in excess of 20 minutes -Minimize sedating medications  Ischemic enteritis versus colitis ileus -Appreciate surgery involvement -Continue antibiotics -Lactate not elevated  Acute renal failure no diarrhea  rhabdomyolysis -Nephrology following -Off CRRT as of 1/19 -last HD 1/22 -Still not responsive to high doses dose Lasix -Avoid nephrotoxic agents -Maintain renal perfusion  Lower extremity weakness -CT imaging with no concerning findings for nerve involvement versus compartment syndrome -CK trending down  Hemoccult positive stool In the setting of colitis -Twice daily PPI  Elevated liver enzymes -Likely secondary to shock liver -Appears to be improving -Continue to trend  R anterior knee wound - Rt prior IO placement - Appreciate WOC recs  Physical deconditioning - PT/OT/SLP as appropriate - Limited at present in the setting of femoral HD catheter - Plan to remove femoral line after HD today  Best Practice (right click and "Reselect all SmartList Selections" daily)   Diet/type:npo DVT prophylaxis: prophylactic heparin  GI prophylaxis: PPI Lines: Dialysis Catheter Foley:  N/A Code Status:  full code Last date of multidisciplinary goals of care discussion: N/A. We have a contact number of a spouse - apparently separated from this woman; however, also has a cousin.  None of which who speak [place].  We will continue to try to reach out via interpreter.  Sherrilyn Rist, MD [place] [facility] Pager: See Shea Evans

## 2021-06-01 NOTE — Clinical Note (Incomplete)
Inpatient Rehab Admissions Coordinator:   I spoke with Pt. Regarding potential CIR admit. He states interest but is not sure he will have support at d/c. He has a spouse listed in his chart but

## 2021-06-01 NOTE — Progress Notes (Signed)
°   06/01/21 0240  Assess: MEWS Score  Temp 98.9 F (37.2 C)  BP 139/83  Pulse Rate (!) 102  ECG Heart Rate (!) 101  Resp (!) 24  Level of Consciousness Alert  SpO2 95 %  O2 Device Room Air  Patient Activity (if Appropriate) In bed  Assess: MEWS Score  MEWS Temp 0  MEWS Systolic 0  MEWS Pulse 1  MEWS RR 1  MEWS LOC 0  MEWS Score 2  MEWS Score Color Yellow  Assess: if the MEWS score is Yellow or Red  Were vital signs taken at a resting state? Yes  Focused Assessment No change from prior assessment  Early Detection of Sepsis Score *See Row Information* Medium  MEWS guidelines implemented *See Row Information* Yes  Treat  Pain Scale 0-10  Pain Score 8  Pain Type Acute pain  Pain Location Abdomen  Pain Descriptors / Indicators Cramping  Pain Frequency Intermittent  Pain Onset On-going  Patients Stated Pain Goal 0  Pain Intervention(s) Repositioned;Rest  Multiple Pain Sites No  Complains of Other (Comment) (abdominal pain)  Interventions Relaxation;Other (comment) (rest)  Take Vital Signs  Increase Vital Sign Frequency  Yellow: Q 2hr X 2 then Q 4hr X 2, if remains yellow, continue Q 4hrs  Escalate  MEWS: Escalate Yellow: discuss with charge nurse/RN and consider discussing with provider and RRT  Notify: Charge Nurse/RN  Name of Charge Nurse/RN Notified Rosalita Chessman  Date Charge Nurse/RN Notified 06/01/21  Time Charge Nurse/RN Notified [phone]

## 2021-06-01 NOTE — Progress Notes (Addendum)
Occupational Therapy Treatment Patient Details Name: Andres Braun Current MRN: [id number] DOB: June 28, 1975 Today's Date: 06/01/2021   History of present illness 45 y.o. male admitted 1/15 after cardiac arrest at home. Pt with agonal he breathing, lost pulse, 20 minutes of CPR, 5 rounds of epinephrine,acute respiratory failure with hypoxia and hypercapnia, septic shock due to undetermined organism, AKI,   acute metabolic encephalopathy,  aspiration pneumonia of both lower lobes due to vomit. MRI brain: negative 1/22, MRI c-cpine: Small right-sided disc protrusion at C5-6 1/22. CT abdomen/pelvis: negative for bowel obstruction; postive for muscular swelling, particularly of the ileo psoas and gluteal  muscles consistent with the clinical history of rhabdomyolysis. Some hemorrhage noted within the right iliopsoas muscle. PMHx - unknown.   OT comments  This 46 yo male admitted with above presents to acute OT with increased fluid today in abdomen and Bil LEs, times he would move how and when we asked him to and other times he would say "I can't" without even trying. He continues to have less AROM in RUE compared to LUE and LLE than RLE. He reports no feeling in left leg until you get to his hip. He will continue to benefit from acute OT with follow up at on CIR for best chance at most recovery.   Recommendations for follow up therapy are one component of a multi-disciplinary discharge planning process, led by the attending physician.  Recommendations may be updated based on patient status, additional functional criteria and insurance authorization.    Follow Up Recommendations  Acute inpatient rehab (3hours/day)    Assistance Recommended at Discharge Frequent or constant Supervision/Assistance  Patient can return home with the following  Two people to help with walking and/or transfers;Two people to help with bathing/dressing/bathroom;Assistance with cooking/housework;Assist for transportation;Direct  supervision/assist for medications management   Equipment Recommendations  Other (comment) (TBD next venue)       Precautions / Restrictions Precautions Precautions: Fall Restrictions Weight Bearing Restrictions: No       Mobility Bed Mobility Overal bed mobility: Needs Assistance Bed Mobility: Supine to Sit, Sit to Supine     Supine to sit: Total assist, +2 for physical assistance, HOB elevated Sit to supine: Total assist, +2 for physical assistance   General bed mobility comments: Assist for almost all aspects coming to sitting (did attempt to move his RLE some and reach for rail with LUE). Assist to bring both legs up into bed and to control descent of trunk            Extremity/Trunk Assessment Upper Extremity Assessment RUE Deficits / Details: edematous, slow to move, decreased coordination, AROM diminshed throughout--at times you ask him to move it and he moves more normally and other times he sayd "I can't" LUE Deficits / Details: Still moving more normlly compared to RUE, but not full AROM             Cognition Arousal/Alertness: Awake/alert Behavior During Therapy: Flat affect Overall Cognitive Status: Impaired/Different from baseline Area of Impairment: Orientation, Following commands, Attention, Safety/judgement, Awareness, Problem solving                 Orientation Level: Disoriented to, Time, Place, Situation Current Attention Level: Sustained   Following Commands: Follows one step commands inconsistently, Follows one step commands with increased time   Awareness: Intellectual Problem Solving: Slow processing, Decreased initiation, Difficulty sequencing, Requires verbal cues, Requires tactile cues General Comments: perservating on wanting water/eat  Pertinent Vitals/ Pain       Pain Assessment Pain Assessment: Faces Faces Pain Scale: Hurts even more Pain Location: left hip with attempt for left lateral leans while  sitting EOB Pain Descriptors / Indicators: Grimacing, Guarding Pain Intervention(s): Limited activity within patient's tolerance, Monitored during session, Repositioned  Home Living Family/patient expects to be discharged to:: Private residence Living Arrangements: Non-relatives/Friends                                          Frequency  Min 2X/week        Progress Toward Goals  OT Goals(current goals can now be found in the care plan section)  Progress towards OT goals: Not progressing toward goals - comment (increased fluid today causing swelling in abdomen and LEs; saying "I can't alot" without even trying)  Acute Rehab OT Goals Patient Stated Goal: to get some food or water OT Goal Formulation: With patient Time For Goal Achievement: 06/12/21 Potential to Achieve Goals: Good  Plan Discharge plan remains appropriate    Co-evaluation    PT/OT/SLP Co-Evaluation/Treatment: Yes Reason for Co-Treatment: Complexity of the patient's impairments (multi-system involvement);Necessary to address cognition/behavior during functional activity;To address functional/ADL transfers;For patient/therapist safety PT goals addressed during session: Mobility/safety with mobility;Balance;Strengthening/ROM OT goals addressed during session: Strengthening/ROM      AM-PAC OT "6 Clicks" Daily Activity     Outcome Measure   Help from another person eating meals?: Total (npo) Help from another person taking care of personal grooming?: A Lot Help from another person toileting, which includes using toliet, bedpan, or urinal?: Total Help from another person bathing (including washing, rinsing, drying)?: Total Help from another person to put on and taking off regular upper body clothing?: Total Help from another person to put on and taking off regular lower body clothing?: Total 6 Click Score: 7    End of Session    OT Visit Diagnosis: Unsteadiness on feet (R26.81);Other  abnormalities of gait and mobility (R26.89);Muscle weakness (generalized) (M62.81);Other symptoms and signs involving cognitive function;Pain Pain - part of body:  (abdomen and lower (increased fluid))   Activity Tolerance Patient limited by pain (also seems to be self limiting in that we would ask him to do something and he would, then later ask the same thing and he would say "I can't" (ie: raising/moving a certain body part or way))   Patient Left in bed;with call bell/phone within reach;with bed alarm set   Nurse Communication Mobility status        Time: [id number] OT Time Calculation (min): 37 min  Charges: OT General Charges $OT Visit: 1 Visit OT Treatments $Therapeutic Activity: 8-22 mins  Golden Circle, OTR/L Acute [employer] Pager [phone] Office [phone]    Almon Register 06/01/2021, 2:23 PM

## 2021-06-01 NOTE — Progress Notes (Addendum)
Patient transferred to [facility].

## 2021-06-01 NOTE — Significant Event (Signed)
Rapid Response Event Note   Reason for Call :  "Severe abdominal pain"  Initial Focused Assessment:  Pt complaining of severe abdominal pain, bedside RN has reached out to covering team concerning pain and PRN medication for pain.   Pt laying in bed with eyes open, skin warm and dry, in no acute distress. Bedside video interrupter used. Pt reports stabbing abdominal pain 10/10 with abdominal tenderness with nausea. While assessing pt, pt had 1 large green liquid bowel movement with 1 thumb sized blood clot on bed pad.   Interventions:  Covering MD ordered stat abdominal xray, GI panel PCR and x1 of Dilaudid.  Plan of Care:  Await results of abdominal xray  Event Summary:  Call RRT for further assistance or concerns  MD Notified: bedside RN prior to arrival Call Time: 0517 Arrival Time: 0521 End Time: 0547  Oswaldo Milian, RN

## 2021-06-01 NOTE — Progress Notes (Signed)
[facility] Progress Note   Subjective:   no UOP.  Had HD late yest-  removed 1000-  had transient low BP with HD.  Gen surgery consulting re: possible ischemic colitis, neuro and ortho consulting re: LE deficits-  for repeat imaging and lactate  Objective Vitals:   06/01/21 0240 06/01/21 0400 06/01/21 0600 06/01/21 0746  BP: 139/83 (!) 143/82 (!) 151/78 (!) 151/87  Pulse: (!) 102 (!) 101 (!) 110 (!) 115  Resp: (!) 24 (!) 25 19 (!) 27  Temp: 98.9 F (37.2 C) 98.3 F (36.8 C) 98.2 F (36.8 C) 98.5 F (36.9 C)  TempSrc: Oral Oral Oral Oral  SpO2: 95% 94% 99% 95%  Weight: 89.6 kg     Height:       Physical Exam General: awake and alert Heart: RRR, no rub Lungs: coarse BL Abdomen: soft, mildly distended Extremities: 1+ edema, feet less mottled, calves warm, R knee dressing in place Neuro:  awake and alert, oriented to self and [place], cannot wiggle L toes or dorsiflex, R can move toes Dialysis Access:  R fem HD catheter -  has been removed   Additional Objective Labs: Basic Metabolic Panel: Recent Labs  Lab 05/30/21 0340 05/31/21 0402 06/01/21 0125  NA 132* 134* 133*  K 3.7 4.5 3.8  CL 99 98 99  CO2 20* 16* 18*  GLUCOSE 111* 113* 99  BUN 108* 163* 136*  CREATININE 6.54* 8.59* 7.31*  CALCIUM 7.7* 7.7* 7.7*  PHOS 4.0 6.1* 6.1*   Liver Function Tests: Recent Labs  Lab 05/26/21 0916 05/26/21 1600 05/28/21 0347 05/28/21 1615 05/30/21 0340 05/31/21 0402 06/01/21 0125  AST 3,852*  --  1,193*  --   --   --   --   ALT 1,756*  --  896*  --   --   --   --   ALKPHOS 86  --  110  --   --   --   --   BILITOT 0.7  --  0.6  --   --   --   --   PROT 5.0*  --  5.6*  --   --   --   --   ALBUMIN 2.2*   < > 2.0*   < > 1.9* 1.7* 2.1*   < > = values in this interval not displayed.   No results for input(s): LIPASE, AMYLASE in the last 168 hours. CBC: Recent Labs  Lab 05/30/21 0340 05/30/21 1625 05/30/21 2330 05/31/21 1544 06/01/21 0125  WBC 16.7* 19.4*  19.5* 18.3* 18.8*  HGB 9.9* 9.6* 8.7* 8.2* 8.8*  HCT 29.4* 28.4* 25.9* 24.2* 25.7*  MCV 83.5 82.6 83.5 82.6 81.1  PLT 145* 174 226 248 293   Blood Culture No results found for: SDES, SPECREQUEST, CULT, REPTSTATUS  Cardiac Enzymes: Recent Labs  Lab 05/30/21 0340  CKTOTAL 13,372*   CBG: Recent Labs  Lab 05/30/21 2326 05/31/21 0353 05/31/21 0734 05/31/21 1547 05/31/21 2332  GLUCAP 103* 193* 137* 103* 105*   Iron Studies: No results for input(s): IRON, TIBC, TRANSFERRIN, FERRITIN in the last 72 hours. @lablastinr3 @ Studies/Results: DG Abd 1 View  Result Date: 06/01/2021 CLINICAL DATA:  46 year old male with history of acute abdominal pain. Bright red blood in stool since Friday. EXAM: ABDOMEN - 1 VIEW COMPARISON:  Abdominal radiograph 05/28/2021. FINDINGS: There are several gas-filled loops of small bowel throughout the abdomen, most evident in the upper abdomen, where some of these bowel loops are mildly distended measuring up to  3.7 cm in diameter. There is a relative paucity of colonic and rectal gas. No frank pneumoperitoneum. IMPRESSION: 1. Nonspecific bowel-gas pattern, as above, which could be indicative of early or partial small bowel obstruction. 2. No pneumoperitoneum. Electronically Signed   By: Vinnie Langton M.D.   On: 06/01/2021 06:37   MR BRAIN W WO CONTRAST  Result Date: 05/31/2021 CLINICAL DATA:  Acute neuro deficit. Rule out stroke. Respiratory arrest. Leukocytosis. EXAM: MRI HEAD WITHOUT AND WITH CONTRAST TECHNIQUE: Multiplanar, multiecho pulse sequences of the brain and surrounding structures were obtained without and with intravenous contrast. CONTRAST:  8.67mL GADAVIST GADOBUTROL 1 MMOL/ML IV SOLN COMPARISON:  CT and 05/30/2021 FINDINGS: Brain: No acute infarction, hemorrhage, hydrocephalus, extra-axial collection or mass lesion. Normal white matter. Normal enhancement postcontrast infusion. Vascular: Normal arterial flow voids. Skull and upper cervical spine:  Negative Sinuses/Orbits: Mucosal edema paranasal sinuses. Near complete opacification left sphenoid sinus with air-fluid level. Other: Motion degraded study IMPRESSION: Motion degraded exam.  Normal MRI of the brain with contrast. Mucosal edema paranasal sinuses with air-fluid level left sphenoid sinus Electronically Signed   By: Franchot Gallo M.D.   On: 05/31/2021 16:32   MR CERVICAL SPINE W WO CONTRAST  Result Date: 05/31/2021 CLINICAL DATA:  Rule out CSF leak. Spontaneous intracranial hypotension. Cardiac arrest. Leukocytosis. EXAM: MRI CERVICAL SPINE WITHOUT AND WITH CONTRAST TECHNIQUE: Multiplanar and multiecho pulse sequences of the cervical spine, to include the craniocervical junction and cervicothoracic junction, were obtained without and with intravenous contrast. CONTRAST:  8.20mL GADAVIST GADOBUTROL 1 MMOL/ML IV SOLN COMPARISON:  None. FINDINGS: Alignment: Normal Vertebrae: Normal bone marrow. Negative for fracture or mass. No evidence of spinal infection. Cord: Normal signal morphology. Posterior Fossa, vertebral arteries, paraspinal tissues: Mega cisterna magna. No soft tissue fluid collection in the cervical spine. Disc levels: Small right paracentral disc protrusion at C5-6 without significant stenosis. Remaining disc spaces normal. Image quality degraded by motion IMPRESSION: Motion degraded study. Small right-sided disc protrusion at C5-6 Negative for spinal infection or fluid collection in the cervical spine. Electronically Signed   By: Franchot Gallo M.D.   On: 05/31/2021 16:35   MR THORACIC SPINE W WO CONTRAST  Result Date: 05/31/2021 CLINICAL DATA:  Acute myelopathy.  Cardiac arrest.  Leukocytosis EXAM: MRI THORACIC WITHOUT AND WITH CONTRAST TECHNIQUE: Multiplanar and multiecho pulse sequences of the thoracic spine were obtained without and with intravenous contrast. CONTRAST:  8.70mL GADAVIST GADOBUTROL 1 MMOL/ML IV SOLN COMPARISON:  CT thoracic spine 05/30/2021 FINDINGS: Alignment:   Normal Vertebrae: Normal bone marrow. Negative for fracture or mass. No evidence of thoracic spine infection. Cord: Normal signal and morphology. No cord compression. Cord signal evaluation is limited by motion. Paraspinal and other soft tissues: Small right pleural effusion. No paraspinous mass or edema. Disc levels: There is significant disc degeneration. No thoracic spinal stenosis. IMPRESSION: Motion degraded study.  No significant abnormality thoracic spine. Electronically Signed   By: Franchot Gallo M.D.   On: 05/31/2021 16:29   MR Lumbar Spine W Wo Contrast  Result Date: 05/31/2021 CLINICAL DATA:  Acute myelopathy.  Leukocytosis.  Cardiac arrest  . EXAM: MRI LUMBAR SPINE WITHOUT AND WITH CONTRAST TECHNIQUE: Multiplanar and multiecho pulse sequences of the lumbar spine were obtained without and with intravenous contrast. CONTRAST:  8.50mL GADAVIST GADOBUTROL 1 MMOL/ML IV SOLN COMPARISON:  CT lumbar spine 05/30/2021 FINDINGS: Segmentation:  5 lumbar vertebra. Alignment:  Normal Vertebrae: Negative for fracture or mass. No evidence of discitis osteomyelitis. Conus medullaris and cauda equina: Conus extends  to the L1-2 level. Conus and cauda equina appear normal. Paraspinal and other soft tissues: Enlargement of the right iliopsoas muscles. Complex fluid collection right psoas muscle measuring 3.2 cm. The fluid collection is hypointense on T2 and T1. Smaller adjacent fluid collection is seen inferior to the larger collection. There is enhancement of the right iliopsoas muscle around the fluid collections. Mild stranding in the tissues surrounding the muscles. Disc levels: L1-2: Negative L2-3: Negative L3-4: Small central disc protrusion and bilateral facet degeneration. Mild subarticular stenosis bilaterally L4-5: Disc bulging and facet degeneration.  Negative for stenosis L5-S1: Mild disc bulging.  Negative for stenosis. Image quality degraded by motion. IMPRESSION: Multiple fluid collections in the right  ileo psoas muscle. The largest collection is 3.2 cm. There is surrounding enhancement and enlargement of the muscles. Findings most likely due to hematoma or abscess. The patient has had a right femoral vascular catheter. No evidence of discitis osteomyelitis in the lumbar spine. Motion degraded study. Electronically Signed   By: Franchot Gallo M.D.   On: 05/31/2021 16:22   Medications:  sodium chloride Stopped (05/29/21 1602)   sodium chloride Stopped (05/30/21 1023)   cefTRIAXone (ROCEPHIN)  IV Stopped (05/31/21 0830)   dexmedetomidine (PRECEDEX) IV infusion Stopped (05/31/21 1601)   lactated ringers Stopped (05/31/21 1954)   metronidazole 100 mL/hr at 05/31/21 2000    B-complex with vitamin C  1 tablet Per Tube Daily   Chlorhexidine Gluconate Cloth  6 each Topical Q0600   feeding supplement  237 mL Oral BID BM   folic acid  1 mg Per Tube Daily   heparin injection (subcutaneous)  5,000 Units Subcutaneous Q8H   mouth rinse  15 mL Mouth Rinse BID   pantoprazole (PROTONIX) IV  40 mg Intravenous Q12H   thiamine  100 mg Per Tube Daily    Assessment/ Plan: AKI severe: in setting of cardiac arrest, rhabdo.  Oliguric.  Required CRRT 1/15 - 1/19.  Anuric despite high dose lasix 1/20.  HD last 1/22 via femoral HD catheter-  has now been removed.  Cont supportive care. Suspect will need HD again tomorrow-  will need new line-  looks like is in the plans SP cardiac arrest - asystolic arrest in setting of prob asp PNA/ ARDS in setting of EtOH Severe metabolic/ resp acidosis -   improved with vent and CRRT.   AHRF - suspected aspiration pneumonitis w/ ARDS, on antibiotics per primary.  Now on [place] Hepatitis - shock, EtOH, rhabdo contributors; per primary AMS - improving but remains encephalopathic.   LE neurologic deficits - ortho and neuro seeing, imaging pending today Possible ischemic bowel - surgery consulting.   Anemia- hgb stable in the 8's for 48 hours -  no meds yet-  will check iron stores and  give ESA    Louis Meckel  06/01/2021, 9:30 AM  [facility] Pager: [phone]

## 2021-06-01 NOTE — Progress Notes (Addendum)
Inpatient Rehab Admissions Coordinator:   I spoke with Pt. Regarding potential CIR admit and while he is interested he is not sure he will have support at d/c.  I spoke with Pt.'s "spouse" [place]. She states she is the mother of his child and they are not together. She is not able to provide any support at discharge and does not think that he has other family locally who can assist. I will continue to work towards identifying a disposition for this Pt.  Clemens Catholic, [place], [place] Admissions Coordinator  [phone] ([place]) [phone] (office)

## 2021-06-01 NOTE — Progress Notes (Addendum)
eLink Physician-Brief Progress Note Patient Name: Andres Braun DOB: 03-Jan-1976 MRN: [id number]   Date of Service  06/01/2021  HPI/Events of Note  45-y/M s/p cardiac arrest at home. PEA arrest, s/p CPR x 20 minutes and Epi x 5. Had been ETOH binging prior to arrest, Found by friends with agonal breathing.  Lost pulse. Improved neurologic status now , extubated 1/19. Remains with sluggish mentation but improving. Course complicated by renal failure requiring HD.   eLink called for request to transfer to telemetry   eICU Interventions  - seen with bedside RN - VSS, off all drips  - got HD today with 1L off, tolerating well  - there was concern for ischemic bowel on CT done 1/21- surgery seen, does nto think this is the case -he does seem to have some infectious or inflammatory colitis however- on Ceftriaxone/Flagyll -mentation is better. Interactive in [place] -will place orders for tele transfer    After floor transfer, patient reportedly c/o severe abdominal pain.  Will get Ab KUB, check Stool PCR  One time dose Dilaudid        Khamila Bassinger N [name] 06/01/2021, 1:32 AM

## 2021-06-01 NOTE — Progress Notes (Incomplete Revision)
Called E-link x2 trying to get pain meds for pt. Pt in room moaning and groaning, stating he has intermittent abdominal pain and cramping. Spoke with Werner Lean, RN who informed this RN that they have known about his pain and they "scanned him from head to toe" and nothing is wrong. WCTM patient.   0932 Secure chatted Dr. Cresenciano Lick for pain meds for the patient. Message was read but no response. Followed up with another message at 0440, message was read but still no response.   6712 Called Elink back again and spoke with Ernie Hew, RN. He stated he would text the MD.   [phone]

## 2021-06-01 NOTE — Plan of Care (Signed)
Assumed service from my outgoing colleague today.  Briefly saw the patient.  Continues to complain of left leg being completely dead and unable to move.  Also not able to move the right leg.  Significant rhabdomyolysis. Most recent imaging done for abdominal pain-CT of the abdomen and pelvis without contrast also reveals muscular swelling of the iliopsoas and gluteal muscles consistent with rhabdomyolysis.  There is some amount of intramuscular hemorrhage in the right iliopsoas musculature but no large hematoma. At this time, his management is all medical as you are. MRI of the neuraxis was unremarkable for acute process. MRI of the left femur was recommended and is pending-primary team to follow. Please call neurology with questions as needed.  -- Milon Dikes, MD Neurologist [facility] Pager: [phone]

## 2021-06-02 ENCOUNTER — Inpatient Hospital Stay (HOSPITAL_COMMUNITY): Payer: Self-pay | Admitting: Certified Registered"

## 2021-06-02 ENCOUNTER — Encounter (HOSPITAL_COMMUNITY)
Admission: EM | Disposition: A | Discharge status: Home / Self Care | Payer: Self-pay | Source: Home / Self Care | Attending: Internal Medicine

## 2021-06-02 ENCOUNTER — Encounter (HOSPITAL_COMMUNITY): Payer: Self-pay | Admitting: Pulmonary Disease

## 2021-06-02 ENCOUNTER — Inpatient Hospital Stay (HOSPITAL_COMMUNITY): Payer: Self-pay

## 2021-06-02 DIAGNOSIS — D72829 Elevated white blood cell count, unspecified: Secondary | ICD-10-CM

## 2021-06-02 HISTORY — PX: LAPAROSCOPY: SHX197

## 2021-06-02 LAB — GASTROINTESTINAL PANEL BY PCR, STOOL (REPLACES STOOL CULTURE)

## 2021-06-02 LAB — HEPATIC FUNCTION PANEL
ALT: 173 U/L — ABNORMAL HIGH (ref 0–44)
AST: 77 U/L — ABNORMAL HIGH (ref 15–41)
Albumin: 1.9 g/dL — ABNORMAL LOW (ref 3.5–5.0)
Alkaline Phosphatase: 158 U/L — ABNORMAL HIGH (ref 38–126)
Bilirubin, Direct: 0.1 mg/dL (ref 0.0–0.2)
Indirect Bilirubin: 0.5 mg/dL (ref 0.3–0.9)
Total Bilirubin: 0.6 mg/dL (ref 0.3–1.2)
Total Protein: 5 g/dL — ABNORMAL LOW (ref 6.5–8.1)

## 2021-06-02 LAB — CK: Total CK: 2119 U/L — ABNORMAL HIGH (ref 49–397)

## 2021-06-02 LAB — IRON AND TIBC
Iron: 25 ug/dL — ABNORMAL LOW (ref 45–182)
Saturation Ratios: 13 % — ABNORMAL LOW (ref 17.9–39.5)
TIBC: 195 ug/dL — ABNORMAL LOW (ref 250–450)
UIBC: 170 ug/dL

## 2021-06-02 LAB — CBC
HCT: 25.2 % — ABNORMAL LOW (ref 39.0–52.0)
HCT: 26.2 % — ABNORMAL LOW (ref 39.0–52.0)
Hemoglobin: 8.6 g/dL — ABNORMAL LOW (ref 13.0–17.0)
Hemoglobin: 8.7 g/dL — ABNORMAL LOW (ref 13.0–17.0)
MCH: 27.7 pg (ref 26.0–34.0)
MCH: 27.6 pg (ref 26.0–34.0)
MCHC: 34.1 g/dL (ref 30.0–36.0)
MCHC: 33.2 g/dL (ref 30.0–36.0)
MCV: 81.3 fL (ref 80.0–100.0)
MCV: 83.2 fL (ref 80.0–100.0)
Platelets: 412 10*3/uL — ABNORMAL HIGH (ref 150–400)
Platelets: 467 10*3/uL — ABNORMAL HIGH (ref 150–400)
RBC: 3.1 MIL/uL — ABNORMAL LOW (ref 4.22–5.81)
RBC: 3.15 MIL/uL — ABNORMAL LOW (ref 4.22–5.81)
RDW: 14 % (ref 11.5–15.5)
RDW: 14.4 % (ref 11.5–15.5)
WBC: 24.9 10*3/uL — ABNORMAL HIGH (ref 4.0–10.5)
WBC: 28 10*3/uL — ABNORMAL HIGH (ref 4.0–10.5)
nRBC: 0 % (ref 0.0–0.2)
nRBC: 0 % (ref 0.0–0.2)

## 2021-06-02 LAB — RENAL FUNCTION PANEL
Albumin: 1.9 g/dL — ABNORMAL LOW (ref 3.5–5.0)
Albumin: 2 g/dL — ABNORMAL LOW (ref 3.5–5.0)
Anion gap: 20 — ABNORMAL HIGH (ref 5–15)
Anion gap: 24 — ABNORMAL HIGH (ref 5–15)
BUN: 149 mg/dL — ABNORMAL HIGH (ref 6–20)
BUN: 191 mg/dL — ABNORMAL HIGH (ref 6–20)
CO2: 15 mmol/L — ABNORMAL LOW (ref 22–32)
CO2: 12 mmol/L — ABNORMAL LOW (ref 22–32)
Calcium: 7.6 mg/dL — ABNORMAL LOW (ref 8.9–10.3)
Calcium: 8.5 mg/dL — ABNORMAL LOW (ref 8.9–10.3)
Chloride: 99 mmol/L (ref 98–111)
Chloride: 96 mmol/L — ABNORMAL LOW (ref 98–111)
Creatinine, Ser: 10.51 mg/dL — ABNORMAL HIGH (ref 0.61–1.24)
Creatinine, Ser: 11.37 mg/dL — ABNORMAL HIGH (ref 0.61–1.24)
GFR, Estimated: 6 mL/min — ABNORMAL LOW (ref >60)
GFR, Estimated: 5 mL/min — ABNORMAL LOW (ref >60)
Glucose, Bld: 98 mg/dL (ref 70–99)
Glucose, Bld: 111 mg/dL — ABNORMAL HIGH (ref 70–99)
Phosphorus: 9.9 mg/dL — ABNORMAL HIGH (ref 2.5–4.6)
Phosphorus: >30 mg/dL — ABNORMAL HIGH (ref 2.5–4.6)
Potassium: 4.3 mmol/L (ref 3.5–5.1)
Potassium: 5 mmol/L (ref 3.5–5.1)
Sodium: 134 mmol/L — ABNORMAL LOW (ref 135–145)
Sodium: 132 mmol/L — ABNORMAL LOW (ref 135–145)

## 2021-06-02 LAB — FERRITIN: Ferritin: 265 ng/mL (ref 24–336)

## 2021-06-02 SURGERY — LAPAROSCOPY, DIAGNOSTIC
Anesthesia: General | Site: Abdomen

## 2021-06-02 MED ORDER — HYDROMORPHONE HCL 1 MG/ML IJ SOLN
INTRAMUSCULAR | Status: AC
  Filled 2021-06-02: qty 1

## 2021-06-02 MED ORDER — DEXAMETHASONE SODIUM PHOSPHATE 10 MG/ML IJ SOLN
INTRAMUSCULAR | Status: DC | PRN
Start: 2021-06-02 — End: 2021-06-02
  Administered 2021-06-02: 10 mg via INTRAVENOUS

## 2021-06-02 MED ORDER — ACETAMINOPHEN 10 MG/ML IV SOLN
INTRAVENOUS | Status: AC
  Filled 2021-06-02: qty 100

## 2021-06-02 MED ORDER — FENTANYL CITRATE (PF) 250 MCG/5ML IJ SOLN
INTRAMUSCULAR | Status: AC
  Filled 2021-06-02: qty 5

## 2021-06-02 MED ORDER — SUGAMMADEX SODIUM 200 MG/2ML IV SOLN
INTRAVENOUS | Status: DC | PRN
Start: 2021-06-02 — End: 2021-06-02
  Administered 2021-06-02: 200 mg via INTRAVENOUS

## 2021-06-02 MED ORDER — OXYCODONE HCL 5 MG PO TABS
5.0000 mg | ORAL_TABLET | ORAL | Status: DC | PRN
  Administered 2021-06-02 – 2021-06-05 (×6): 10 mg via ORAL
  Administered 2021-06-05: 5 mg via ORAL
  Administered 2021-06-06 – 2021-06-11 (×17): 10 mg via ORAL
  Administered 2021-06-12: 5 mg via ORAL
  Administered 2021-06-12 – 2021-06-13 (×3): 10 mg via ORAL
  Administered 2021-06-13: 5 mg via ORAL
  Administered 2021-06-13 – 2021-06-19 (×17): 10 mg via ORAL
  Administered 2021-06-19: 5 mg via ORAL
  Administered 2021-06-19 – 2021-06-21 (×14): 10 mg via ORAL
  Administered 2021-06-22: 5 mg via ORAL
  Administered 2021-06-22 – 2021-06-26 (×16): 10 mg via ORAL
  Filled 2021-06-02 (×33): qty 2
  Filled 2021-06-02: qty 1
  Filled 2021-06-02 (×3): qty 2
  Filled 2021-06-02: qty 1
  Filled 2021-06-02: qty 2
  Filled 2021-06-02: qty 1
  Filled 2021-06-02 (×9): qty 2
  Filled 2021-06-02: qty 1
  Filled 2021-06-02 (×31): qty 2
  Filled 2021-06-02: qty 1
  Filled 2021-06-02 (×3): qty 2

## 2021-06-02 MED ORDER — CHLORHEXIDINE GLUCONATE 0.12 % MT SOLN
OROMUCOSAL | Status: AC
  Administered 2021-06-02: 12:00:00 15 mL via OROMUCOSAL
  Filled 2021-06-02: qty 15

## 2021-06-02 MED ORDER — PIPERACILLIN-TAZOBACTAM IN DEX 2-0.25 GM/50ML IV SOLN
2.2500 g | Freq: Three times a day (TID) | INTRAVENOUS | Status: DC
  Administered 2021-06-02 – 2021-06-05 (×9): 2.25 g via INTRAVENOUS
  Filled 2021-06-02 (×11): qty 50

## 2021-06-02 MED ORDER — DEXAMETHASONE SODIUM PHOSPHATE 10 MG/ML IJ SOLN
INTRAMUSCULAR | Status: AC
  Filled 2021-06-02: qty 1

## 2021-06-02 MED ORDER — MEPERIDINE HCL 25 MG/ML IJ SOLN: 6.2500 mg | INTRAMUSCULAR | Status: DC | PRN

## 2021-06-02 MED ORDER — PROPOFOL 10 MG/ML IV BOLUS
INTRAVENOUS | Status: AC
  Filled 2021-06-02: qty 20

## 2021-06-02 MED ORDER — SODIUM CHLORIDE 0.9 % IV SOLN: INTRAVENOUS | Status: DC

## 2021-06-02 MED ORDER — OXYCODONE HCL 5 MG PO TABS
ORAL_TABLET | ORAL | Status: AC
  Filled 2021-06-02: qty 2

## 2021-06-02 MED ORDER — ROCURONIUM BROMIDE 10 MG/ML (PF) SYRINGE
PREFILLED_SYRINGE | INTRAVENOUS | Status: DC | PRN
  Administered 2021-06-02: 70 mg via INTRAVENOUS
  Administered 2021-06-02: 30 mg via INTRAVENOUS

## 2021-06-02 MED ORDER — BUPIVACAINE-EPINEPHRINE (PF) 0.25% -1:200000 IJ SOLN
INTRAMUSCULAR | Status: AC
  Filled 2021-06-02: qty 30

## 2021-06-02 MED ORDER — ROCURONIUM BROMIDE 10 MG/ML (PF) SYRINGE
PREFILLED_SYRINGE | INTRAVENOUS | Status: AC
  Filled 2021-06-02: qty 10

## 2021-06-02 MED ORDER — ACETAMINOPHEN 10 MG/ML IV SOLN
1000.0000 mg | Freq: Once | INTRAVENOUS | Status: DC | PRN
  Administered 2021-06-02: 14:00:00 1000 mg via INTRAVENOUS

## 2021-06-02 MED ORDER — ONDANSETRON HCL 4 MG/2ML IJ SOLN
INTRAMUSCULAR | Status: DC | PRN
  Administered 2021-06-02: 4 mg via INTRAVENOUS

## 2021-06-02 MED ORDER — ACETAMINOPHEN 500 MG PO TABS
1000.0000 mg | ORAL_TABLET | Freq: Four times a day (QID) | ORAL | Status: DC | PRN
  Administered 2021-06-07 – 2021-06-12 (×6): 1000 mg via ORAL
  Filled 2021-06-02 (×9): qty 2

## 2021-06-02 MED ORDER — LIDOCAINE 2% (20 MG/ML) 5 ML SYRINGE
INTRAMUSCULAR | Status: DC | PRN
Start: 2021-06-02 — End: 2021-06-02
  Administered 2021-06-02: 60 mg via INTRAVENOUS

## 2021-06-02 MED ORDER — PROPOFOL 10 MG/ML IV BOLUS
INTRAVENOUS | Status: DC | PRN
  Administered 2021-06-02: 200 mg via INTRAVENOUS

## 2021-06-02 MED ORDER — FENTANYL CITRATE (PF) 100 MCG/2ML IJ SOLN
INTRAMUSCULAR | Status: DC | PRN
Start: 2021-06-02 — End: 2021-06-02
  Administered 2021-06-02: 150 ug via INTRAVENOUS
  Administered 2021-06-02: 100 ug via INTRAVENOUS

## 2021-06-02 MED ORDER — ONDANSETRON HCL 4 MG/2ML IJ SOLN
INTRAMUSCULAR | Status: AC
  Filled 2021-06-02: qty 2

## 2021-06-02 MED ORDER — HYDROMORPHONE HCL 1 MG/ML IJ SOLN
0.2500 mg | INTRAMUSCULAR | Status: DC | PRN
  Administered 2021-06-02: 14:00:00 0.5 mg via INTRAVENOUS

## 2021-06-02 MED ORDER — 0.9 % SODIUM CHLORIDE (POUR BTL) OPTIME
TOPICAL | Status: DC | PRN
  Administered 2021-06-02 (×2): 1000 mL

## 2021-06-02 MED ORDER — SUCCINYLCHOLINE CHLORIDE 200 MG/10ML IV SOSY
PREFILLED_SYRINGE | INTRAVENOUS | Status: AC
  Filled 2021-06-02: qty 10

## 2021-06-02 MED ORDER — MORPHINE SULFATE (PF) 2 MG/ML IV SOLN
2.0000 mg | INTRAVENOUS | Status: DC | PRN
  Administered 2021-06-06 – 2021-06-17 (×40): 2 mg via INTRAVENOUS
  Filled 2021-06-02 (×42): qty 1

## 2021-06-02 MED ORDER — LIDOCAINE 2% (20 MG/ML) 5 ML SYRINGE
INTRAMUSCULAR | Status: AC
  Filled 2021-06-02: qty 5

## 2021-06-02 MED ORDER — CHLORHEXIDINE GLUCONATE 0.12 % MT SOLN: 15.0000 mL | Freq: Once | OROMUCOSAL | Status: AC

## 2021-06-02 MED ORDER — ORAL CARE MOUTH RINSE: 15.0000 mL | Freq: Once | OROMUCOSAL | Status: AC

## 2021-06-02 MED ORDER — PROMETHAZINE HCL 25 MG/ML IJ SOLN: 6.2500 mg | INTRAMUSCULAR | Status: DC | PRN

## 2021-06-02 SURGICAL SUPPLY — 59 items
APPLIER CLIP 5 13 M/L LIGAMAX5 (MISCELLANEOUS)
BAG COUNTER SPONGE SURGICOUNT (BAG) ×3 IMPLANT
BLADE CLIPPER SURG (BLADE) IMPLANT
CANISTER SUCT 3000ML PPV (MISCELLANEOUS) ×3 IMPLANT
CHLORAPREP W/TINT 26 (MISCELLANEOUS) ×3 IMPLANT
CLIP APPLIE 5 13 M/L LIGAMAX5 (MISCELLANEOUS) IMPLANT
COVER SURGICAL LIGHT HANDLE (MISCELLANEOUS) ×3 IMPLANT
DERMABOND ADVANCED (GAUZE/BANDAGES/DRESSINGS) ×1
DERMABOND ADVANCED .7 DNX12 (GAUZE/BANDAGES/DRESSINGS) ×2 IMPLANT
DRAPE LAPAROSCOPIC ABDOMINAL (DRAPES) ×1 IMPLANT
DRAPE WARM FLUID 44X44 (DRAPES) ×3 IMPLANT
DRSG OPSITE POSTOP 4X10 (GAUZE/BANDAGES/DRESSINGS) IMPLANT
DRSG OPSITE POSTOP 4X8 (GAUZE/BANDAGES/DRESSINGS) IMPLANT
ELECT BLADE 6.5 EXT (BLADE) IMPLANT
ELECT CAUTERY BLADE 6.4 (BLADE) ×2 IMPLANT
ELECT REM PT RETURN 9FT ADLT (ELECTROSURGICAL) ×3
ELECTRODE REM PT RTRN 9FT ADLT (ELECTROSURGICAL) ×2 IMPLANT
GLOVE SURG ENC MOIS LTX SZ7 (GLOVE) ×3 IMPLANT
GLOVE SURG UNDER POLY LF SZ7.5 (GLOVE) ×3 IMPLANT
GOWN STRL REUS W/ TWL LRG LVL3 (GOWN DISPOSABLE) ×6 IMPLANT
GOWN STRL REUS W/TWL LRG LVL3 (GOWN DISPOSABLE) ×3
GRASPER SUT TROCAR 14GX15 (MISCELLANEOUS) ×2 IMPLANT
HANDLE SUCTION POOLE (INSTRUMENTS) ×2 IMPLANT
KIT BASIN OR (CUSTOM PROCEDURE TRAY) ×3 IMPLANT
KIT TURNOVER KIT B (KITS) ×3 IMPLANT
LIGASURE IMPACT 36 18CM CVD LR (INSTRUMENTS) IMPLANT
NS IRRIG 1000ML POUR BTL (IV SOLUTION) ×6 IMPLANT
PACK GENERAL/GYN (CUSTOM PROCEDURE TRAY) ×1 IMPLANT
PAD ARMBOARD 7.5X6 YLW CONV (MISCELLANEOUS) ×3 IMPLANT
PENCIL SMOKE EVACUATOR (MISCELLANEOUS) ×3 IMPLANT
SCISSORS LAP 5X35 DISP (ENDOMECHANICALS) IMPLANT
SET IRRIG TUBING LAPAROSCOPIC (IRRIGATION / IRRIGATOR) IMPLANT
SET TUBE SMOKE EVAC HIGH FLOW (TUBING) ×3 IMPLANT
SLEEVE ENDOPATH XCEL 5M (ENDOMECHANICALS) ×3 IMPLANT
SPECIMEN JAR LARGE (MISCELLANEOUS) IMPLANT
SPONGE T-LAP 18X18 ~~LOC~~+RFID (SPONGE) ×4 IMPLANT
STAPLER VISISTAT 35W (STAPLE) ×3 IMPLANT
STRIP CLOSURE SKIN 1/2X4 (GAUZE/BANDAGES/DRESSINGS) ×2 IMPLANT
SUCTION POOLE HANDLE (INSTRUMENTS) ×3
SUT MNCRL AB 4-0 PS2 18 (SUTURE) ×2 IMPLANT
SUT PDS AB 1 TP1 96 (SUTURE) ×6 IMPLANT
SUT SILK 2 0 (SUTURE) ×1
SUT SILK 2 0 SH CR/8 (SUTURE) ×3 IMPLANT
SUT SILK 2-0 18XBRD TIE 12 (SUTURE) ×2 IMPLANT
SUT SILK 3 0 (SUTURE) ×1
SUT SILK 3 0 SH CR/8 (SUTURE) ×3 IMPLANT
SUT SILK 3-0 18XBRD TIE 12 (SUTURE) ×2 IMPLANT
SUT VIC AB 3-0 SH 27 (SUTURE)
SUT VIC AB 3-0 SH 27X BRD (SUTURE) IMPLANT
SUT VICRYL 0 UR6 27IN ABS (SUTURE) ×2 IMPLANT
TOWEL GREEN STERILE (TOWEL DISPOSABLE) ×3 IMPLANT
TOWEL GREEN STERILE FF (TOWEL DISPOSABLE) ×3 IMPLANT
TRAY FOLEY MTR SLVR 14FR STAT (SET/KITS/TRAYS/PACK) ×2 IMPLANT
TRAY FOLEY MTR SLVR 16FR STAT (SET/KITS/TRAYS/PACK) ×1 IMPLANT
TRAY LAPAROSCOPIC MC (CUSTOM PROCEDURE TRAY) ×3 IMPLANT
TROCAR XCEL BLUNT TIP 100MML (ENDOMECHANICALS) ×2 IMPLANT
TROCAR XCEL NON-BLD 11X100MML (ENDOMECHANICALS) IMPLANT
TROCAR XCEL NON-BLD 5MMX100MML (ENDOMECHANICALS) ×3 IMPLANT
YANKAUER SUCT BULB TIP NO VENT (SUCTIONS) ×2 IMPLANT

## 2021-06-02 NOTE — Progress Notes (Signed)
IR procedure request.   46 y.o. male inpatient. History of cardiac arrest at home Found to be in rhabdomyolysis, renal failure, small bowel ischemia. Now with  worsening leukocystotosis.MR Lumbar from 1.22.23 reads Multiple fluid collections in the right ileo psoas muscle. The largest collection is 3.2 cm.   Team is requesting an aspiration or drain placement to the right psoas muscle.  Case has been reviewed Dr. Jacqualyn Posey who after looking at the images the area in question is a hematoma and not a fluid collection and therefore is not amenable to aspiration or drain placement. This was communicated to Team via [employer].

## 2021-06-02 NOTE — Op Note (Signed)
Preoperative diagnosis: recent cardiac arrest, renal failure, questionable ischemic bowel Postoperative diagnosis: Same as above Procedure: Diagnostic laparoscopy Surgeon: Dr. Harden Mo Assistant:Michael Maczis, PA-C Estimated blood loss: Minimal Anesthesia: General Complications: None Drains: None Specimens: None Special count correct at completion Decision recovery stable condition  Indications: This is a 46 year old male who sustained a cardiac arrest at home.  He has been brought to [facility] where he is undergone 20 minutes of CPR.  He does have return of spontaneous circulation.  He has been extubated.  He is in renal failure now receiving dialysis.  He has had some abdominal pain since he was taken off the ventilator.  He has had a couple of CAT scans which show some thickened small bowel on his right lower quadrant.  His white blood cell count is up today.  His lactate has remained normal.  Due to the increase in his tenderness today and concern for peritonitis I discussed going to the operating room today.  Procedure: After consent was taken from the patient although I am not entirely sure as he is able to adequately give consent we proceeded the operating room.  He has no family to discuss and we discussed it with a friend that he wanted [place] to call.  This is an emergency though.  He was on antibiotics.  SCDs were in place.  He was placed under general anesthesia without complication.  He was prepped and draped in the standard sterile surgical fashion.  A surgical timeout was then performed.  I made a vertical incision below the umbilicus.  I then grasped the fascia and incised this sharply.  I entered the peritoneum bluntly.  I placed a 0 Vicryl pursestring suture through the fascia.  I inserted a Hassan trocar and insufflated the abdomen to 15 mmHg pressure.  He certainly had an ileus and there was not a lot of room present.  He had some free fluid that was just serous in nature  throughout his abdominal cavity.  There is no evidence of any infection.  I placed 2 additional 5 mm trochars in the left upper quadrant.  I then was able to identify the right colon.  The cecum, appendix, remainder of the right colon was viable.  I was then able to identify the terminal ileum and traced this back.  The area that appeared abnormal on the CT scan is some thickened bowel but this is viable.  I did not think this needed to be resected at this point and hopefully the will just continue to improve over some time.  I elected to remove the trocars at this point.  I did place an additional 0 Vicryl suture in the umbilical defect after tying the pursestring down.  I then closed these with 4-0 Monocryl and glue.  He tolerated this well was transferred to the recovery room in stable condition.

## 2021-06-02 NOTE — Progress Notes (Signed)
SLP Cancellation Note  Patient Details Name: Branson Kranz MRN: [id number] DOB: 07-Mar-1976   Cancelled treatment:       Reason Eval/Treat Not Completed: Patient not medically ready. Potential surgery today. Will f/u when appropriate.    Edgar Corrigan, Riley Nearing 06/02/2021, 10:05 AM

## 2021-06-02 NOTE — Progress Notes (Signed)
PT Cancellation Note  Patient Details Name: Andres Braun MRN: [id number] DOB: 03-Jan-1976   Cancelled Treatment:    Reason Eval/Treat Not Completed: Patient at procedure or test/unavailable (to OR). Will follow-up for PT treatment as schedule permits.  Mabeline Caras, PT, DPT Acute Rehabilitation Services  Pager [phone] Office [place] 06/02/2021, 12:10 PM

## 2021-06-02 NOTE — Progress Notes (Addendum)
PROGRESS NOTE   Dequanta Kuehler  [id number]    DOB: August 09, 1975    DOA: 05/24/2021  PCP: Pcp, No   I have briefly reviewed patients previous medical records in [facility].  Chief Complaint  Patient presents with   Cardiac Arrest    Brief Narrative:  46 year old male admitted to ICU 1/15 under PCCM care after cardiac arrest at home.  PEA arrest, s/p CPR x20 minutes and epi x5.  Patient reportedly out drinking with his friends the night prior to admission.  Found by friends with agonal breathing.  Intubated on arrival, needed vasopressors, noted to have renal failure, rhabdomyolysis with CK in the 14 K range, treated for aspiration pneumonia, nephrology consulted and started on CRRT.  Extubated 1/19 and off pressors.  Mental status slowly improved.  Transferred to telemetry.  Care transferred to [facility] on 1/24.  Nephrology following for HD needs.  General surgery on board for questionable small bowel ischemia and taken emergently to surgery 1/24.   Assessment & Plan:  Principal Problem:   Cardiac arrest with pulseless electrical activity ([place]) Active Problems:   Acute respiratory failure with hypoxia and hypercapnia (HCC)   Septic shock due to undetermined organism ([place])   AKI (acute kidney injury) ([place])   Acute metabolic encephalopathy   Aspiration pneumonia of both lower lobes due to vomit [facility])  Cardiac arrest/PEA arrest -Unclear etiology.?  In the setting of aspiration pneumonia and EtOH. -2D echo: LVEF 55-60%, no regional wall motion abnormalities.  Grade 1 diastolic dysfunction.   Acute hypoxemic respiratory failure Pneumomediastinum Nondisplaced fractures involving costochondral cartilage of anterior right fifth and sixth ribs Aspiration pneumonia Leukocytosis Intubated 1/15, extubated 1/19. -Currently on room air.  Acute respiratory failure resolved. -Continue pulmonary toileting -Follow chest x-ray 1/21: Interval decrease in pulmonary vascular congestion and  pulmonary edema.  No pneumothorax.  Tip of right IJ central venous catheter in SVC close to the right atrium.  Will order chest x-ray for 1/25. -Completed ceftriaxone 1/15-1/19 -CT abdomen 1/23 showed patchy bilateral peripheral lower lobe airspace densities, pneumonia versus pulmonary infarctions.  Although completed above ceftriaxone for pneumonia, still remains on it for abdominal indication. -CT abdomen 1/23 also redemonstrated pneumomediastinum and soft tissue air within the anterior chest.  No pneumothorax. - check LEV dopplers to r/o DVT   Acute metabolic encephalopathy -CT negative initially -Downtime in excess of 20 minutes -Minimize sedating medications -Although mental status has improved compared to admission, remains altered and encephalopathic.  May have an element of anoxic brain injury.   Ischemic enteritis versus colitis ileus -Appreciate surgery involvement -Continue antibiotics-currently on IV ceftriaxone and metronidazole -Lactate not elevated -Discussed in detail with CCS team today, at bedside and subsequently via phone.  Patient with significant pain and tenderness in his LLQ, leukocytosis is worsened without alternate cause for same.  They are concerned about an ischemic bowel. -CCS has taken him to the OR for diagnostic laparoscopy and possible small bowel resection, possible colectomy and ostomy. -Do not believe patient clearly understands the severity and urgency of this, he does not wish for [place] to contact his ex-spouse or kids, his brother reportedly is in [place] making it difficult to reach in a timely fashion.  Patient did provide me permission to call his friend [name] listed on his contacts.  I spoke with Mr. Jerilee Hoh who confirms that he is patient's friend and was willing to speak to the surgeon's regarding need for emergent surgery and consenting for same.  I relayed  the same message to the surgeons.  Addendum Surgeons just updated me that  diagnostic laparoscopy was negative and no other intervention was needed   Acute renal failure Severe metabolic acidosis Rhabdomyolysis -Nephrology following -Off CRRT as of 1/19 -last HD 1/22 with another HD planned 1/24. Has been placed. -Oliguric to anuric. -Avoid nephrotoxic agents -Maintain renal perfusion -Added CK to this morning's labs -CT abdomen 1/23: Muscular swelling, particularly of the iliopsoas and gluteal muscles consistent with rhabdomyolysis.  Some hemorrhage within the right iliopsoas muscle-not characterized as frank or significant hematoma at this time.   Lower extremity weakness -CT imaging with no concerning findings for nerve involvement versus compartment syndrome -CK trending down -Really unclear etiology.  Left lower extremity weaker than right. -As per neurology follow-up 1/23: MRI of the neuraxis was unremarkable for acute process.  MRI of the left femur pending. - As per MRI L spine a few days ago: Multiple fluid collections in the right ileo psoas muscle. The largest collection is 3.2 cm. There is surrounding enhancement and enlargement of the muscles. Findings most likely due to hematoma or abscess. No evidence of discitis osteomyelitis in the lumbar spine - Discussed in detail with Neuro hospitalist, no clear etiology based on extensive imaging.  Also discussed with Dr. Trenton Gammon, neurosurgery on 1/24, he reviewed images and did not think that there was a neurosurgical cause for his lower extremity weakness.  He did suggest getting IR to aspirate the above collection to rule out abscess.  Consulted IR.  He indicated that a psoas abscess or hematoma could cause lumbar plexus compression/irritation leading to some secondary weakness. Addendum: IR reviewed images and suggested that the collection was likely a hematoma and would like predispose to infection if aspiration attempted and did not advise aspiration.   Anemia/Hemoccult positive stool In the setting of  colitis -Twice daily PPI -Stable in the 8 g range.   Elevated liver enzymes -Likely secondary to shock liver, EtOH and rhabdomyolysis -Appears to be improving -Continue to trend.  Added LFTs to this morning labs.   R anterior knee wound - Rt prior IO placement - Appreciate WOC recs   Physical deconditioning - PT/OT/SLP as appropriate - Limited at present in the setting of acute interventions and encephalopathy  Mechanical fall 1/24 and hit his head -As per nursing report, prior to going to the OR, patient attempted to get out of the bed, fell and hit his head without any obvious external injuries or LOC.  Patient's friend was in the room. -Advised nursing regarding strict fall precautions, placement of safety sitter if needed and monitor closely.  Body mass index is 26.52 kg/m.  Nutritional Status Nutrition Problem: Inadequate oral intake Etiology: acute illness Signs/Symptoms: NPO status Interventions: MVI, Tube feeding    DVT prophylaxis: heparin injection 5,000 Units Start: 05/28/21 1400     Code Status: Full Code:  Family Communication: Discussed with patient's friend via phone as noted above. Disposition:  Status is: Inpatient  Remains inpatient appropriate because: Severity of illness       Consultants:   PCCM General surgery Nephrology Orthopedics Neurology  Procedures:   As noted above  Antimicrobials:   As noted above   Subjective:  Seen this morning along with CCS PA in the room.  Attempted to interview and examine him with assistance of video interpreter.  Alert and oriented to self only.  Appears confused.  Does indicate that his brother lives in [place].  Finally gave permission to speak with his  friend locally.  Reported abdominal pain.    Objective:   Vitals:   06/02/21 0715 06/02/21 1044 06/02/21 1122 06/02/21 1302  BP: (!) 142/86 (!) 155/89 (!) 160/99   Pulse: 90  88   Resp: 20 17 17    Temp: 98.7 F (37.1 C) 98.7 F (37.1 C)  97.7 F (36.5 C)   TempSrc: Oral Oral Oral   SpO2: 98% 98% 96%   Weight:    88.7 kg  Height:    6' (1.829 m)    General exam: Young male, moderately built and nourished lying comfortably propped up in bed without distress. Respiratory system: Clear to auscultation. Respiratory effort normal. Cardiovascular system: S1 & S2 heard, RRR. No JVD, murmurs, rubs, gallops or clicks. No pedal edema.  Telemetry personally reviewed: Sinus tachycardia in the 100s-110s. Gastrointestinal system: Abdomen is nondistended, lower abdominal tenderness, especially periumbilical and left lower quadrant but no obvious guarding to my exam. No organomegaly or masses felt.  Diminished bowel sounds heard. Central nervous system: Alert and oriented to self. No focal neurological deficits. Extremities: Symmetric 5 x 5 power in upper extremities.  Able to wiggle toes on the right lower extremity and 0/5 power in left lower extremity. Skin: No rashes, lesions or ulcers Psychiatry: Judgement and insight impaired. Mood & affect appropriate.     Data Reviewed:   I have personally reviewed following labs and imaging studies   CBC: Recent Labs  Lab 05/31/21 1544 06/01/21 0125 06/02/21 0440  WBC 18.3* 18.8* 24.9*  HGB 8.2* 8.8* 8.6*  HCT 24.2* 25.7* 25.2*  MCV 82.6 81.1 81.3  PLT 248 293 412*    Basic Metabolic Panel: Recent Labs  Lab 05/28/21 0347 05/28/21 1615 05/29/21 0253 05/30/21 0340 05/31/21 0402 06/01/21 0125 06/02/21 0440  NA 136   < > 137 132* 134* 133* 134*  K 3.6   < > 3.8 3.7 4.5 3.8 4.3  CL 102   < > 103 99 98 99 99  CO2 26   < > 25 20* 16* 18* 15*  GLUCOSE 130*   < > 115* 111* 113* 99 98  BUN 33*   < > 55* 108* 163* 136* 149*  CREATININE 2.75*   < > 4.23* 6.54* 8.59* 7.31* 10.51*  CALCIUM 7.7*   < > 8.0* 7.7* 7.7* 7.7* 7.6*  MG 2.9*  --  2.9* 3.3* 3.4* 2.8*  --   PHOS 2.9   < > 2.3* 4.0 6.1* 6.1* 9.9*   < > = values in this interval not displayed.    Liver Function  Tests: Recent Labs  Lab 05/28/21 0347 05/28/21 1615 05/29/21 0253 05/30/21 0340 05/31/21 0402 06/01/21 0125 06/02/21 0440  AST 1,193*  --   --   --   --   --   --   ALT 896*  --   --   --   --   --   --   ALKPHOS 110  --   --   --   --   --   --   BILITOT 0.6  --   --   --   --   --   --   PROT 5.6*  --   --   --   --   --   --   ALBUMIN 2.0*   < > 1.9* 1.9* 1.7* 2.1* 1.9*   < > = values in this interval not displayed.    CBG: Recent Labs  Lab 05/31/21 0734 05/31/21 1547  05/31/21 2332  GLUCAP 137* 103* 105*    Microbiology Studies:   Recent Results (from the past 240 hour(s))  Resp Panel by RT-PCR (Flu A&B, Covid) Nasopharyngeal Swab     Status: None   Collection Time: 05/24/21  9:16 AM   Specimen: Nasopharyngeal Swab; Nasopharyngeal(NP) swabs in vial transport medium  Result Value Ref Range Status   SARS Coronavirus 2 by RT PCR NEGATIVE NEGATIVE Final    Comment: (NOTE) SARS-CoV-2 target nucleic acids are NOT DETECTED.  The SARS-CoV-2 RNA is generally detectable in upper respiratory specimens during the acute phase of infection. The lowest concentration of SARS-CoV-2 viral copies this assay can detect is 138 copies/mL. A negative result does not preclude SARS-Cov-2 infection and should not be used as the sole basis for treatment or other patient management decisions. A negative result may occur with  improper specimen collection/handling, submission of specimen other than nasopharyngeal swab, presence of viral mutation(s) within the areas targeted by this assay, and inadequate number of viral copies(<138 copies/mL). A negative result must be combined with clinical observations, patient history, and epidemiological information. The expected result is Negative.  Fact Sheet for Patients:  EntrepreneurPulse.com.au  Fact Sheet for Healthcare Providers:  IncredibleEmployment.be  This test is no t yet approved or cleared by the  [place] FDA and  has been authorized for detection and/or diagnosis of SARS-CoV-2 by FDA under an Emergency Use Authorization (EUA). This EUA will remain  in effect (meaning this test can be used) for the duration of the COVID-19 declaration under Section 564(b)(1) of the Act, 21 U.S.C.section 360bbb-3(b)(1), unless the authorization is terminated  or revoked sooner.       Influenza A by PCR NEGATIVE NEGATIVE Final   Influenza B by PCR NEGATIVE NEGATIVE Final    Comment: (NOTE) The Xpert Xpress SARS-CoV-2/FLU/RSV plus assay is intended as an aid in the diagnosis of influenza from Nasopharyngeal swab specimens and should not be used as a sole basis for treatment. Nasal washings and aspirates are unacceptable for Xpert Xpress SARS-CoV-2/FLU/RSV testing.  Fact Sheet for Patients: EntrepreneurPulse.com.au  Fact Sheet for Healthcare Providers: IncredibleEmployment.be  This test is not yet approved or cleared by the [place] FDA and has been authorized for detection and/or diagnosis of SARS-CoV-2 by FDA under an Emergency Use Authorization (EUA). This EUA will remain in effect (meaning this test can be used) for the duration of the COVID-19 declaration under Section 564(b)(1) of the Act, 21 U.S.C. section 360bbb-3(b)(1), unless the authorization is terminated or revoked.  Performed at [facility], [place] [address] [place], [place] [address]   MRSA Next Gen by PCR, Nasal     Status: None   Collection Time: 05/24/21  1:20 PM   Specimen: Nasal Mucosa; Nasal Swab  Result Value Ref Range Status   MRSA by PCR Next Gen NOT DETECTED NOT DETECTED Final    Comment: (NOTE) The GeneXpert MRSA Assay (FDA approved for NASAL specimens only), is one component of a comprehensive MRSA colonization surveillance program. It is not intended to diagnose MRSA infection nor to guide or monitor treatment for MRSA infections. Test performance is not  FDA approved in patients less than 46 years old. Performed at [facility], [place] [address], [place], [place] [address]   Gastrointestinal Panel by PCR , Stool     Status: None   Collection Time: 06/01/21  5:27 AM   Specimen: Stool  Result Value Ref Range Status   Campylobacter species NOT DETECTED  NOT DETECTED Final   Plesimonas shigelloides NOT DETECTED NOT DETECTED Final   Salmonella species NOT DETECTED NOT DETECTED Final   Yersinia enterocolitica NOT DETECTED NOT DETECTED Final   Vibrio species NOT DETECTED NOT DETECTED Final   Vibrio cholerae NOT DETECTED NOT DETECTED Final   Enteroaggregative E coli (EAEC) NOT DETECTED NOT DETECTED Final   Enteropathogenic E coli (EPEC) NOT DETECTED NOT DETECTED Final   Enterotoxigenic E coli (ETEC) NOT DETECTED NOT DETECTED Final   Shiga like toxin producing E coli (STEC) NOT DETECTED NOT DETECTED Final   Shigella/Enteroinvasive E coli (EIEC) NOT DETECTED NOT DETECTED Final   Cryptosporidium NOT DETECTED NOT DETECTED Final   Cyclospora cayetanensis NOT DETECTED NOT DETECTED Final   Entamoeba histolytica NOT DETECTED NOT DETECTED Final   Giardia lamblia NOT DETECTED NOT DETECTED Final   Adenovirus F40/41 NOT DETECTED NOT DETECTED Final   Astrovirus NOT DETECTED NOT DETECTED Final   Norovirus GI/GII NOT DETECTED NOT DETECTED Final   Rotavirus A NOT DETECTED NOT DETECTED Final   Sapovirus (I, II, IV, and V) NOT DETECTED NOT DETECTED Final    Comment: Performed at [facility], [address], [place], [place] [address]    Radiology Studies:  CT ABDOMEN PELVIS WO CONTRAST  Result Date: 06/01/2021 CLINICAL DATA:  Abdominal pain, acute, nonlocalized. Cardiac arrest 8 days ago. Renal failure secondary to rhabdomyolysis. EXAM: CT ABDOMEN AND PELVIS WITHOUT CONTRAST TECHNIQUE: Multidetector CT imaging of the abdomen and pelvis was performed following the standard protocol without IV contrast. RADIATION DOSE REDUCTION: This exam was  performed according to the departmental dose-optimization program which includes automated exposure control, adjustment of the mA and/or kV according to patient size and/or use of iterative reconstruction technique. COMPARISON:  CT 2 days ago. FINDINGS: Lower chest: Small right effusion layering dependently. Patchy peripheral areas of parenchymal density in both lower lobes appear similar to the previous study and could represent infectious pneumonia or pulmonary infarctions. Calcified subcarinal lymph nodes as seen previously. Pneumomediastinum and chest wall air as seen previously, possibly subsequent to previous resuscitation. No progressive or worsening finding. Hepatobiliary: Liver parenchyma appears normal without contrast. No calcified gallstones. Pancreas: Normal Spleen: Normal Adrenals/Urinary Tract: Adrenal glands are normal. The kidneys appear enlarged consistent with acute nephritis. No evidence of obstruction or focal lesion. Bladder appears unremarkable, nearly empty. Small amount of air in the bladder as seen previously. Stomach/Bowel: Stomach appears normal. Question wall thickening in a segmental region of the ileum, possibly ischemic insult with this clinical history. No evidence of frank necrosis. No dilated small bowel. Appendix is normal. There may be mild edema of the right colon as well. Mild diverticulosis of the left colon. Vascular/Lymphatic: Aorta and IVC are normal. No adenopathy. Mild nonspecific retroperitoneal edema, often seen in critically ill patients lying supine. Reproductive: Normal Other: Tiny amount of free intraperitoneal fluid. Apparent muscular swelling of the iliopsoas and gluteal muscles that could go along with the clinical history of rhabdomyolysis. There may be some subtle hyperdensity within the right ileo psoas musculature consistent with some intramuscular hemorrhage. No large hematoma. IMPRESSION: Redemonstration of patchy bilateral peripheral lower lobe airspace  density which could be due to pneumonia or pulmonary infarctions. No progressive change. Redemonstration of pneumomediastinum and soft tissue air within the anterior chest, presumably subsequent to resuscitation. No visible pneumothorax in the region studied. Redemonstration of bowel wall edema the distal ileum and possibly the proximal ascending colon, possibly ischemic in this setting. No progressive change or evidence of frank bowel  necrosis. Enlarged kidneys consistent with the history of acute renal failure. No sign of obstruction. Muscular swelling, particularly of the ileo psoas and gluteal muscles consistent with the clinical history of rhabdomyolysis. Some hemorrhage noted within the right iliopsoas muscle, which I would not categorize as a frank or significant hematoma at this time. Electronically Signed   By: Nelson Chimes M.D.   On: 06/01/2021 11:33   DG Abd 1 View  Result Date: 06/01/2021 CLINICAL DATA:  46 year old male with history of acute abdominal pain. Bright red blood in stool since Friday. EXAM: ABDOMEN - 1 VIEW COMPARISON:  Abdominal radiograph 05/28/2021. FINDINGS: There are several gas-filled loops of small bowel throughout the abdomen, most evident in the upper abdomen, where some of these bowel loops are mildly distended measuring up to 3.7 cm in diameter. There is a relative paucity of colonic and rectal gas. No frank pneumoperitoneum. IMPRESSION: 1. Nonspecific bowel-gas pattern, as above, which could be indicative of early or partial small bowel obstruction. 2. No pneumoperitoneum. Electronically Signed   By: Vinnie Langton M.D.   On: 06/01/2021 06:37   MR BRAIN W WO CONTRAST  Result Date: 05/31/2021 CLINICAL DATA:  Acute neuro deficit. Rule out stroke. Respiratory arrest. Leukocytosis. EXAM: MRI HEAD WITHOUT AND WITH CONTRAST TECHNIQUE: Multiplanar, multiecho pulse sequences of the brain and surrounding structures were obtained without and with intravenous contrast. CONTRAST:   8.74mL GADAVIST GADOBUTROL 1 MMOL/ML IV SOLN COMPARISON:  CT and 05/30/2021 FINDINGS: Brain: No acute infarction, hemorrhage, hydrocephalus, extra-axial collection or mass lesion. Normal white matter. Normal enhancement postcontrast infusion. Vascular: Normal arterial flow voids. Skull and upper cervical spine: Negative Sinuses/Orbits: Mucosal edema paranasal sinuses. Near complete opacification left sphenoid sinus with air-fluid level. Other: Motion degraded study IMPRESSION: Motion degraded exam.  Normal MRI of the brain with contrast. Mucosal edema paranasal sinuses with air-fluid level left sphenoid sinus Electronically Signed   By: Franchot Gallo M.D.   On: 05/31/2021 16:32   MR CERVICAL SPINE W WO CONTRAST  Result Date: 05/31/2021 CLINICAL DATA:  Rule out CSF leak. Spontaneous intracranial hypotension. Cardiac arrest. Leukocytosis. EXAM: MRI CERVICAL SPINE WITHOUT AND WITH CONTRAST TECHNIQUE: Multiplanar and multiecho pulse sequences of the cervical spine, to include the craniocervical junction and cervicothoracic junction, were obtained without and with intravenous contrast. CONTRAST:  8.41mL GADAVIST GADOBUTROL 1 MMOL/ML IV SOLN COMPARISON:  None. FINDINGS: Alignment: Normal Vertebrae: Normal bone marrow. Negative for fracture or mass. No evidence of spinal infection. Cord: Normal signal morphology. Posterior Fossa, vertebral arteries, paraspinal tissues: Mega cisterna magna. No soft tissue fluid collection in the cervical spine. Disc levels: Small right paracentral disc protrusion at C5-6 without significant stenosis. Remaining disc spaces normal. Image quality degraded by motion IMPRESSION: Motion degraded study. Small right-sided disc protrusion at C5-6 Negative for spinal infection or fluid collection in the cervical spine. Electronically Signed   By: Franchot Gallo M.D.   On: 05/31/2021 16:35   MR THORACIC SPINE W WO CONTRAST  Result Date: 05/31/2021 CLINICAL DATA:  Acute myelopathy.  Cardiac  arrest.  Leukocytosis EXAM: MRI THORACIC WITHOUT AND WITH CONTRAST TECHNIQUE: Multiplanar and multiecho pulse sequences of the thoracic spine were obtained without and with intravenous contrast. CONTRAST:  8.53mL GADAVIST GADOBUTROL 1 MMOL/ML IV SOLN COMPARISON:  CT thoracic spine 05/30/2021 FINDINGS: Alignment:  Normal Vertebrae: Normal bone marrow. Negative for fracture or mass. No evidence of thoracic spine infection. Cord: Normal signal and morphology. No cord compression. Cord signal evaluation is limited by motion. Paraspinal and other soft  tissues: Small right pleural effusion. No paraspinous mass or edema. Disc levels: There is significant disc degeneration. No thoracic spinal stenosis. IMPRESSION: Motion degraded study.  No significant abnormality thoracic spine. Electronically Signed   By: Franchot Gallo M.D.   On: 05/31/2021 16:29   MR Lumbar Spine W Wo Contrast  Result Date: 05/31/2021 CLINICAL DATA:  Acute myelopathy.  Leukocytosis.  Cardiac arrest  . EXAM: MRI LUMBAR SPINE WITHOUT AND WITH CONTRAST TECHNIQUE: Multiplanar and multiecho pulse sequences of the lumbar spine were obtained without and with intravenous contrast. CONTRAST:  8.104mL GADAVIST GADOBUTROL 1 MMOL/ML IV SOLN COMPARISON:  CT lumbar spine 05/30/2021 FINDINGS: Segmentation:  5 lumbar vertebra. Alignment:  Normal Vertebrae: Negative for fracture or mass. No evidence of discitis osteomyelitis. Conus medullaris and cauda equina: Conus extends to the L1-2 level. Conus and cauda equina appear normal. Paraspinal and other soft tissues: Enlargement of the right iliopsoas muscles. Complex fluid collection right psoas muscle measuring 3.2 cm. The fluid collection is hypointense on T2 and T1. Smaller adjacent fluid collection is seen inferior to the larger collection. There is enhancement of the right iliopsoas muscle around the fluid collections. Mild stranding in the tissues surrounding the muscles. Disc levels: L1-2: Negative L2-3: Negative  L3-4: Small central disc protrusion and bilateral facet degeneration. Mild subarticular stenosis bilaterally L4-5: Disc bulging and facet degeneration.  Negative for stenosis L5-S1: Mild disc bulging.  Negative for stenosis. Image quality degraded by motion. IMPRESSION: Multiple fluid collections in the right ileo psoas muscle. The largest collection is 3.2 cm. There is surrounding enhancement and enlargement of the muscles. Findings most likely due to hematoma or abscess. The patient has had a right femoral vascular catheter. No evidence of discitis osteomyelitis in the lumbar spine. Motion degraded study. Electronically Signed   By: Franchot Gallo M.D.   On: 05/31/2021 16:22   DG CHEST PORT 1 VIEW  Result Date: 06/01/2021 CLINICAL DATA:  Placement of central venous catheter EXAM: PORTABLE CHEST 1 VIEW COMPARISON:  05/30/2021 FINDINGS: Transverse diameter of heart is increased. There is interval decrease in pulmonary vascular congestion and pulmonary edema. There is poor inspiration. There are no new focal infiltrates. There is interval placement of large caliber right IJ central venous catheter with its tip in the superior vena cava close to the right atrium. There is no significant pleural effusion or pneumothorax. IMPRESSION: There is interval decrease in pulmonary vascular congestion and pulmonary edema. There is no pneumothorax. Tip of right IJ central venous catheter is seen in the superior vena cava close to the right atrium. Electronically Signed   By: Elmer Picker M.D.   On: 06/01/2021 16:22    Scheduled Meds:    [MAR Hold] B-complex with vitamin C  1 tablet Per Tube Daily   [MAR Hold] Chlorhexidine Gluconate Cloth  6 each Topical Q0600   [MAR Hold] Chlorhexidine Gluconate Cloth  6 each Topical Q0600   [MAR Hold] darbepoetin (ARANESP) injection - NON-DIALYSIS  100 mcg Subcutaneous Q Mon-1800   [MAR Hold] feeding supplement  237 mL Oral BID BM   [MAR Hold] folic acid  1 mg Per Tube Daily    [MAR Hold] heparin injection (subcutaneous)  5,000 Units Subcutaneous Q8H   [MAR Hold] mouth rinse  15 mL Mouth Rinse BID   [MAR Hold] pantoprazole (PROTONIX) IV  40 mg Intravenous Q12H    Continuous Infusions:    sodium chloride Stopped (05/29/21 1602)   sodium chloride 10 mL/hr at 06/02/21 1158   sodium chloride     [  MAR Hold] dexmedetomidine (PRECEDEX) IV infusion Stopped (05/31/21 1601)   [MAR Hold] piperacillin-tazobactam (ZOSYN)  IV       LOS: 9 days     Vernell Leep, MD,  FACP, [facility], [facility], [facility] (Care Management Physician Certified) [place]  To contact the attending provider between 7A-7P or the covering provider during after hours 7P-7A, please log into the web site [email or web address] and access using universal [place] password for that web site. If you do not have the password, please call the hospital operator.  06/02/2021, 1:46 PM
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
## 2021-06-02 NOTE — Progress Notes (Signed)
Lower extremity venous has been completed.   Preliminary results in CV Proc.   Aundra Millet Tynetta Bachmann 06/02/2021 3:15 PM

## 2021-06-02 NOTE — Anesthesia Procedure Notes (Signed)
Procedure Name: Intubation Date/Time: 06/02/2021 1:15 PM Performed by: Barrington Ellison, CRNA Pre-anesthesia Checklist: Patient identified, Emergency Drugs available, Suction available and Patient being monitored Patient Re-evaluated:Patient Re-evaluated prior to induction Oxygen Delivery Method: Circle System Utilized Preoxygenation: Pre-oxygenation with 100% oxygen Induction Type: IV induction Ventilation: Mask ventilation without difficulty Laryngoscope Size: Mac and 4 Grade View: Grade I Tube type: Oral Tube size: 7.5 mm Number of attempts: 1 Airway Equipment and Method: Stylet and Oral airway Placement Confirmation: ETT inserted through vocal cords under direct vision, positive ETCO2 and breath sounds checked- equal and bilateral Secured at: 22 cm Tube secured with: Tape Dental Injury: Teeth and Oropharynx as per pre-operative assessment

## 2021-06-02 NOTE — Plan of Care (Signed)

## 2021-06-02 NOTE — Progress Notes (Signed)
46 year old male status post cardiac arrest now with unexplained left lower extremity weakness.  Work-up of his spine and brain reveal no evidence of compressive pathology.  Patient with left psoas fluid collection worrisome for abscess versus hematoma.  Certainly psoas abscess or hematoma could cause lumbar plexus compression/irritation and lead to some secondary weakness.  I think would be reasonable to have radiology aspirate the psoas fluid collection for diagnosis.  No role for neurosurgical intervention.

## 2021-06-02 NOTE — Transfer of Care (Signed)
Immediate Anesthesia Transfer of Care Note  Patient: Andres Braun  Procedure(s) Performed: LAPAROSCOPY DIAGNOSTIC (Abdomen)  Patient Location: PACU  Anesthesia Type:General  Level of Consciousness: drowsy and patient cooperative  Airway & Oxygen Therapy: Patient Spontanous Breathing  Post-op Assessment: Report given to RN  Post vital signs: Reviewed and stable  Last Vitals:  Vitals Value Taken Time  BP 135/87 06/02/21 1401  Temp    Pulse 104 06/02/21 1402  Resp 20 06/02/21 1402  SpO2 96 % 06/02/21 1402  Vitals shown include unvalidated device data.  Last Pain:  Vitals:   06/02/21 1147  TempSrc:   PainSc: 0-No pain      Patients Stated Pain Goal: 0 (06/01/21 0600)  Complications: No notable events documented.
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
## 2021-06-02 NOTE — Progress Notes (Addendum)
1 Day Post-Op  Subjective: CC: Seen with attending. Spanish interpreter used. Patient reports continued pain in his abdomen that seems to be worse on the right side and is unchanged from yesterday. He reports vomiting overnight. No UOP. Denies flatus or bm but does have stool output recorded in I/O's. Afebrile. WBC up from 18.8 to 24.9.   Objective: Vital signs in last 24 hours: Temp:  [98.3 F (36.8 C)-99.3 F (37.4 C)] 98.7 F (37.1 C) (01/24 0715) Pulse Rate:  [90-115] 90 (01/24 0715) Resp:  [14-28] 20 (01/24 0715) BP: (104-172)/(76-94) 142/86 (01/24 0715) SpO2:  [95 %-100 %] 98 % (01/24 0715) Weight:  [88.7 kg] 88.7 kg (01/24 0500) Last BM Date: 06/01/21  Intake/Output from previous day: No intake/output data recorded. Intake/Output this shift: No intake/output data recorded.  PE: Gen:  Alert, NAD, pleasant Card:  Tachycardic  Pulm: Normal rate and effort  Abd: Moderately distended with generalized tenderness worse in the right side, now with guarding.  Psych: Alert. Believes it is 46. Knows he is in [place] and his heart stopped  Lab Results:  Recent Labs    06/01/21 0125 06/02/21 0440  WBC 18.8* 24.9*  HGB 8.8* 8.6*  HCT 25.7* 25.2*  PLT 293 412*   BMET Recent Labs    06/01/21 0125 06/02/21 0440  NA 133* 134*  K 3.8 4.3  CL 99 99  CO2 18* 15*  GLUCOSE 99 98  BUN 136* 149*  CREATININE 7.31* 10.51*  CALCIUM 7.7* 7.6*   PT/INR No results for input(s): LABPROT, INR in the last 72 hours. CMP     Component Value Date/Time   NA 134 (L) 06/02/2021 0440   K 4.3 06/02/2021 0440   CL 99 06/02/2021 0440   CO2 15 (L) 06/02/2021 0440   GLUCOSE 98 06/02/2021 0440   BUN 149 (H) 06/02/2021 0440   CREATININE 10.51 (H) 06/02/2021 0440   CALCIUM 7.6 (L) 06/02/2021 0440   PROT 5.6 (L) 05/28/2021 0347   ALBUMIN 1.9 (L) 06/02/2021 0440   AST 1,193 (H) 05/28/2021 0347   ALT 896 (H) 05/28/2021 0347   ALKPHOS 110 05/28/2021 0347   BILITOT 0.6  05/28/2021 0347   GFRNONAA 6 (L) 06/02/2021 0440   Lipase  No results found for: LIPASE  Studies/Results: CT ABDOMEN PELVIS WO CONTRAST  Result Date: 06/01/2021 CLINICAL DATA:  Abdominal pain, acute, nonlocalized. Cardiac arrest 8 days ago. Renal failure secondary to rhabdomyolysis. EXAM: CT ABDOMEN AND PELVIS WITHOUT CONTRAST TECHNIQUE: Multidetector CT imaging of the abdomen and pelvis was performed following the standard protocol without IV contrast. RADIATION DOSE REDUCTION: This exam was performed according to the departmental dose-optimization program which includes automated exposure control, adjustment of the mA and/or kV according to patient size and/or use of iterative reconstruction technique. COMPARISON:  CT 2 days ago. FINDINGS: Lower chest: Small right effusion layering dependently. Patchy peripheral areas of parenchymal density in both lower lobes appear similar to the previous study and could represent infectious pneumonia or pulmonary infarctions. Calcified subcarinal lymph nodes as seen previously. Pneumomediastinum and chest wall air as seen previously, possibly subsequent to previous resuscitation. No progressive or worsening finding. Hepatobiliary: Liver parenchyma appears normal without contrast. No calcified gallstones. Pancreas: Normal Spleen: Normal Adrenals/Urinary Tract: Adrenal glands are normal. The kidneys appear enlarged consistent with acute nephritis. No evidence of obstruction or focal lesion. Bladder appears unremarkable, nearly empty. Small amount of air in the bladder as seen previously. Stomach/Bowel: Stomach appears normal. Question wall thickening in  a segmental region of the ileum, possibly ischemic insult with this clinical history. No evidence of frank necrosis. No dilated small bowel. Appendix is normal. There may be mild edema of the right colon as well. Mild diverticulosis of the left colon. Vascular/Lymphatic: Aorta and IVC are normal. No adenopathy. Mild  nonspecific retroperitoneal edema, often seen in critically ill patients lying supine. Reproductive: Normal Other: Tiny amount of free intraperitoneal fluid. Apparent muscular swelling of the iliopsoas and gluteal muscles that could go along with the clinical history of rhabdomyolysis. There may be some subtle hyperdensity within the right ileo psoas musculature consistent with some intramuscular hemorrhage. No large hematoma. IMPRESSION: Redemonstration of patchy bilateral peripheral lower lobe airspace density which could be due to pneumonia or pulmonary infarctions. No progressive change. Redemonstration of pneumomediastinum and soft tissue air within the anterior chest, presumably subsequent to resuscitation. No visible pneumothorax in the region studied. Redemonstration of bowel wall edema the distal ileum and possibly the proximal ascending colon, possibly ischemic in this setting. No progressive change or evidence of frank bowel necrosis. Enlarged kidneys consistent with the history of acute renal failure. No sign of obstruction. Muscular swelling, particularly of the ileo psoas and gluteal muscles consistent with the clinical history of rhabdomyolysis. Some hemorrhage noted within the right iliopsoas muscle, which I would not categorize as a frank or significant hematoma at this time. Electronically Signed   By: Paulina Fusi M.D.   On: 06/01/2021 11:33   DG Abd 1 View  Result Date: 06/01/2021 CLINICAL DATA:  46 year old male with history of acute abdominal pain. Bright red blood in stool since Friday. EXAM: ABDOMEN - 1 VIEW COMPARISON:  Abdominal radiograph 05/28/2021. FINDINGS: There are several gas-filled loops of small bowel throughout the abdomen, most evident in the upper abdomen, where some of these bowel loops are mildly distended measuring up to 3.7 cm in diameter. There is a relative paucity of colonic and rectal gas. No frank pneumoperitoneum. IMPRESSION: 1. Nonspecific bowel-gas pattern, as  above, which could be indicative of early or partial small bowel obstruction. 2. No pneumoperitoneum. Electronically Signed   By: Trudie Reed M.D.   On: 06/01/2021 06:37   MR BRAIN W WO CONTRAST  Result Date: 05/31/2021 CLINICAL DATA:  Acute neuro deficit. Rule out stroke. Respiratory arrest. Leukocytosis. EXAM: MRI HEAD WITHOUT AND WITH CONTRAST TECHNIQUE: Multiplanar, multiecho pulse sequences of the brain and surrounding structures were obtained without and with intravenous contrast. CONTRAST:  8.76mL GADAVIST GADOBUTROL 1 MMOL/ML IV SOLN COMPARISON:  CT and 05/30/2021 FINDINGS: Brain: No acute infarction, hemorrhage, hydrocephalus, extra-axial collection or mass lesion. Normal white matter. Normal enhancement postcontrast infusion. Vascular: Normal arterial flow voids. Skull and upper cervical spine: Negative Sinuses/Orbits: Mucosal edema paranasal sinuses. Near complete opacification left sphenoid sinus with air-fluid level. Other: Motion degraded study IMPRESSION: Motion degraded exam.  Normal MRI of the brain with contrast. Mucosal edema paranasal sinuses with air-fluid level left sphenoid sinus Electronically Signed   By: Marlan Palau M.D.   On: 05/31/2021 16:32   MR CERVICAL SPINE W WO CONTRAST  Result Date: 05/31/2021 CLINICAL DATA:  Rule out CSF leak. Spontaneous intracranial hypotension. Cardiac arrest. Leukocytosis. EXAM: MRI CERVICAL SPINE WITHOUT AND WITH CONTRAST TECHNIQUE: Multiplanar and multiecho pulse sequences of the cervical spine, to include the craniocervical junction and cervicothoracic junction, were obtained without and with intravenous contrast. CONTRAST:  8.76mL GADAVIST GADOBUTROL 1 MMOL/ML IV SOLN COMPARISON:  None. FINDINGS: Alignment: Normal Vertebrae: Normal bone marrow. Negative for fracture or mass. No  evidence of spinal infection. Cord: Normal signal morphology. Posterior Fossa, vertebral arteries, paraspinal tissues: Mega cisterna magna. No soft tissue fluid  collection in the cervical spine. Disc levels: Small right paracentral disc protrusion at C5-6 without significant stenosis. Remaining disc spaces normal. Image quality degraded by motion IMPRESSION: Motion degraded study. Small right-sided disc protrusion at C5-6 Negative for spinal infection or fluid collection in the cervical spine. Electronically Signed   By: Marlan Palauharles  Clark M.D.   On: 05/31/2021 16:35   MR THORACIC SPINE W WO CONTRAST  Result Date: 05/31/2021 CLINICAL DATA:  Acute myelopathy.  Cardiac arrest.  Leukocytosis EXAM: MRI THORACIC WITHOUT AND WITH CONTRAST TECHNIQUE: Multiplanar and multiecho pulse sequences of the thoracic spine were obtained without and with intravenous contrast. CONTRAST:  8.298mL GADAVIST GADOBUTROL 1 MMOL/ML IV SOLN COMPARISON:  CT thoracic spine 05/30/2021 FINDINGS: Alignment:  Normal Vertebrae: Normal bone marrow. Negative for fracture or mass. No evidence of thoracic spine infection. Cord: Normal signal and morphology. No cord compression. Cord signal evaluation is limited by motion. Paraspinal and other soft tissues: Small right pleural effusion. No paraspinous mass or edema. Disc levels: There is significant disc degeneration. No thoracic spinal stenosis. IMPRESSION: Motion degraded study.  No significant abnormality thoracic spine. Electronically Signed   By: Marlan Palauharles  Clark M.D.   On: 05/31/2021 16:29   MR Lumbar Spine W Wo Contrast  Result Date: 05/31/2021 CLINICAL DATA:  Acute myelopathy.  Leukocytosis.  Cardiac arrest  . EXAM: MRI LUMBAR SPINE WITHOUT AND WITH CONTRAST TECHNIQUE: Multiplanar and multiecho pulse sequences of the lumbar spine were obtained without and with intravenous contrast. CONTRAST:  8.1038mL GADAVIST GADOBUTROL 1 MMOL/ML IV SOLN COMPARISON:  CT lumbar spine 05/30/2021 FINDINGS: Segmentation:  5 lumbar vertebra. Alignment:  Normal Vertebrae: Negative for fracture or mass. No evidence of discitis osteomyelitis. Conus medullaris and cauda equina:  Conus extends to the L1-2 level. Conus and cauda equina appear normal. Paraspinal and other soft tissues: Enlargement of the right iliopsoas muscles. Complex fluid collection right psoas muscle measuring 3.2 cm. The fluid collection is hypointense on T2 and T1. Smaller adjacent fluid collection is seen inferior to the larger collection. There is enhancement of the right iliopsoas muscle around the fluid collections. Mild stranding in the tissues surrounding the muscles. Disc levels: L1-2: Negative L2-3: Negative L3-4: Small central disc protrusion and bilateral facet degeneration. Mild subarticular stenosis bilaterally L4-5: Disc bulging and facet degeneration.  Negative for stenosis L5-S1: Mild disc bulging.  Negative for stenosis. Image quality degraded by motion. IMPRESSION: Multiple fluid collections in the right ileo psoas muscle. The largest collection is 3.2 cm. There is surrounding enhancement and enlargement of the muscles. Findings most likely due to hematoma or abscess. The patient has had a right femoral vascular catheter. No evidence of discitis osteomyelitis in the lumbar spine. Motion degraded study. Electronically Signed   By: Marlan Palauharles  Clark M.D.   On: 05/31/2021 16:22   DG CHEST PORT 1 VIEW  Result Date: 06/01/2021 CLINICAL DATA:  Placement of central venous catheter EXAM: PORTABLE CHEST 1 VIEW COMPARISON:  05/30/2021 FINDINGS: Transverse diameter of heart is increased. There is interval decrease in pulmonary vascular congestion and pulmonary edema. There is poor inspiration. There are no new focal infiltrates. There is interval placement of large caliber right IJ central venous catheter with its tip in the superior vena cava close to the right atrium. There is no significant pleural effusion or pneumothorax. IMPRESSION: There is interval decrease in pulmonary vascular congestion and pulmonary edema. There  is no pneumothorax. Tip of right IJ central venous catheter is seen in the superior vena  cava close to the right atrium. Electronically Signed   By: Ernie Avena M.D.   On: 06/01/2021 16:22    Anti-infectives: Anti-infectives (From admission, onward)    Start     Dose/Rate Route Frequency Ordered Stop   05/30/21 0800  cefTRIAXone (ROCEPHIN) 2 g in sodium chloride 0.9 % 100 mL IVPB        2 g 200 mL/hr over 30 Minutes Intravenous Every 24 hours 05/30/21 0747     05/30/21 0800  metroNIDAZOLE (FLAGYL) IVPB 500 mg        500 mg 100 mL/hr over 60 Minutes Intravenous Every 12 hours 05/30/21 0747     05/26/21 1000  cefTRIAXone (ROCEPHIN) 2 g in sodium chloride 0.9 % 100 mL IVPB        2 g 200 mL/hr over 30 Minutes Intravenous Every 24 hours 05/25/21 1618 05/28/21 1008   05/24/21 1130  cefTRIAXone (ROCEPHIN) 1 g in sodium chloride 0.9 % 100 mL IVPB  Status:  Discontinued        1 g 200 mL/hr over 30 Minutes Intravenous Every 24 hours 05/24/21 1119 05/25/21 1618        Assessment/Plan 46 year old male with questionable small bowel ischemia. - Seen with my attending this AM. The CT from yesterday appears similar to prior, with bowel wall edema of the distal ileum and possibly the proximal ascending colon which could be 2/2 ischemia in his clinical setting. Patient with continued unchanged abdominal pain from yesterday. He is tender on exam with some guarding/peritoneal signs now on the R side. Lactic acid was wnl yesterday, but he has a worsening leukocytosis today. Will reach out to Dr. Waymon Amato to see if this is coming from other source (PNA that was noted on CT, etc). Our team feels the patient will need diagnostic laparoscopy with possible exploratory laparotomy, bowel resection, and possible ileostomy. Patient seems to understand this but is not orientated to sign his own consent. Trying to find family to contact to discuss surgery and obtain consent. Patient is unable to provide information for next of kin contact. He does not wish for [place] to contact his prior spouse or  children. He said he has a brother in [place] but does not have his contact information. Appreciate Dr. Waymon Amato and the patient's RN also looking into this.    Addendum: Dr. Waymon Amato reached out to me. The patient asked for [place] to not contact his ex-wife or children (who are under the age of 42). He asked for [place] to reach out to Mr. Laverle Hobby. I spoke with Mr. Ardyth Harps on the phone who said they have been friends for a long time, are currently roommates and confirmed there is no other family in the area for [place] to contact. After discussion with Mr. Ardyth Harps about the above planned surgery he wished to reach out to the patient to speak with him. I spoke with Mr. Ardyth Harps again after he was able to speak with the patient. Mr. Ardyth Harps seems to understand planned procedure and is agreeable to sign consent for the patient to proceed with surgery. He is also going to try and bring the patient his cell phone before surgery incase there is anyone additionally he would like to contact.   FEN - NPO, IVF per CCM VTE - SCDs, okay for chemical prophylaxis from our standpoint ID - Rocephin 1/15 - 1/24. Flagyl 1/21 -1/24.  Zosyn 1/24 >>     Cardiac arrest s/p CPR Asp PNA Acute metabolic encephalopathy Acute renal failure secondary to rhabdomyolysis with oliguria  Rhabdo  Etoh use R ileo psoas fluid collection  High Medical Decision Making   LOS: 9 days    Jacinto HalimMichael M Inika Bellanger , [facility] 06/02/2021, 8:02 AM Please see Amion for pager number during day hours 7:00am-4:30pm

## 2021-06-02 NOTE — Anesthesia Preprocedure Evaluation (Signed)
Anesthesia Evaluation  Patient identified by MRN, date of birth, ID band Patient confused    Reviewed: Allergy & Precautions, NPO status , Patient's Chart, lab work & pertinent test results  Airway Mallampati: I       Dental no notable dental hx.    Pulmonary Patient abstained from smoking.,     + decreased breath sounds      Cardiovascular Normal cardiovascular exam  Cardiac arrest 9 days ago now with ARF   Neuro/Psych    GI/Hepatic negative GI ROS, Neg liver ROS,   Endo/Other  negative endocrine ROS  Renal/GU ARF and DialysisRenal disease  negative genitourinary   Musculoskeletal   Abdominal Normal abdominal exam  (+)   Peds  Hematology  (+) anemia ,   Anesthesia Other Findings   Reproductive/Obstetrics                             Anesthesia Physical Anesthesia Plan  ASA: 3  Anesthesia Plan: General   Post-op Pain Management: Dilaudid IV   Induction: Intravenous  PONV Risk Score and Plan: 4 or greater and Ondansetron, Dexamethasone and Midazolam  Airway Management Planned: Oral ETT  Additional Equipment: None  Intra-op Plan:   Post-operative Plan:   Informed Consent: I have reviewed the patients History and Physical, chart, labs and discussed the procedure including the risks, benefits and alternatives for the proposed anesthesia with the patient or authorized representative who has indicated his/her understanding and acceptance.     Dental advisory given  Plan Discussed with: CRNA  Anesthesia Plan Comments:         Anesthesia Quick Evaluation

## 2021-06-02 NOTE — Progress Notes (Signed)
[facility] Progress Note   Subjective:   no UOP recorded.   Gen surgery considering ex lap-  not sure timing , HD is planned for today as well.  Is alert, a little confused but able to tell me mucho dolor in belly   Objective Vitals:   06/02/21 0400 06/02/21 0422 06/02/21 0500 06/02/21 0715  BP: (!) 141/77   (!) 142/86  Pulse: 100 (!) 103  90  Resp: (!) 23 14  20   Temp: 99.3 F (37.4 C)   98.7 F (37.1 C)  TempSrc: Oral   Oral  SpO2: 98% 99%  98%  Weight:   88.7 kg   Height:       Physical Exam General: awake and alert Heart: RRR, no rub Lungs: coarse BL Abdomen: soft, mildly distended Extremities: 1+ edema, feet less mottled, calves warm, R knee dressing in place Neuro:  awake and alert, oriented to self and [place], cannot wiggle L toes or dorsiflex, R can move toes Dialysis Access:  R IJ  HD catheter -  placed 1/23  Additional Objective Labs: Basic Metabolic Panel: Recent Labs  Lab 05/31/21 0402 06/01/21 0125 06/02/21 0440  NA 134* 133* 134*  K 4.5 3.8 4.3  CL 98 99 99  CO2 16* 18* 15*  GLUCOSE 113* 99 98  BUN 163* 136* 149*  CREATININE 8.59* 7.31* 10.51*  CALCIUM 7.7* 7.7* 7.6*  PHOS 6.1* 6.1* 9.9*   Liver Function Tests: Recent Labs  Lab 05/26/21 0916 05/26/21 1600 05/28/21 0347 05/28/21 1615 05/31/21 0402 06/01/21 0125 06/02/21 0440  AST 3,852*  --  1,193*  --   --   --   --   ALT 1,756*  --  896*  --   --   --   --   ALKPHOS 86  --  110  --   --   --   --   BILITOT 0.7  --  0.6  --   --   --   --   PROT 5.0*  --  5.6*  --   --   --   --   ALBUMIN 2.2*   < > 2.0*   < > 1.7* 2.1* 1.9*   < > = values in this interval not displayed.   No results for input(s): LIPASE, AMYLASE in the last 168 hours. CBC: Recent Labs  Lab 05/30/21 1625 05/30/21 2330 05/31/21 1544 06/01/21 0125 06/02/21 0440  WBC 19.4* 19.5* 18.3* 18.8* 24.9*  HGB 9.6* 8.7* 8.2* 8.8* 8.6*  HCT 28.4* 25.9* 24.2* 25.7* 25.2*  MCV 82.6 83.5 82.6 81.1 81.3  PLT 174  226 248 293 412*   Blood Culture No results found for: SDES, SPECREQUEST, CULT, REPTSTATUS  Cardiac Enzymes: Recent Labs  Lab 05/30/21 0340  CKTOTAL 13,372*   CBG: Recent Labs  Lab 05/30/21 2326 05/31/21 0353 05/31/21 0734 05/31/21 1547 05/31/21 2332  GLUCAP 103* 193* 137* 103* 105*   Iron Studies:  Recent Labs    06/02/21 0440  IRON 25*  TIBC 195*  FERRITIN 265   @[email or web address] @ Studies/Results: CT ABDOMEN PELVIS WO CONTRAST  Result Date: 06/01/2021 CLINICAL DATA:  Abdominal pain, acute, nonlocalized. Cardiac arrest 8 days ago. Renal failure secondary to rhabdomyolysis. EXAM: CT ABDOMEN AND PELVIS WITHOUT CONTRAST TECHNIQUE: Multidetector CT imaging of the abdomen and pelvis was performed following the standard protocol without IV contrast. RADIATION DOSE REDUCTION: This exam was performed according to the departmental dose-optimization program which includes automated exposure control, adjustment of the mA  and/or kV according to patient size and/or use of iterative reconstruction technique. COMPARISON:  CT 2 days ago. FINDINGS: Lower chest: Small right effusion layering dependently. Patchy peripheral areas of parenchymal density in both lower lobes appear similar to the previous study and could represent infectious pneumonia or pulmonary infarctions. Calcified subcarinal lymph nodes as seen previously. Pneumomediastinum and chest wall air as seen previously, possibly subsequent to previous resuscitation. No progressive or worsening finding. Hepatobiliary: Liver parenchyma appears normal without contrast. No calcified gallstones. Pancreas: Normal Spleen: Normal Adrenals/Urinary Tract: Adrenal glands are normal. The kidneys appear enlarged consistent with acute nephritis. No evidence of obstruction or focal lesion. Bladder appears unremarkable, nearly empty. Small amount of air in the bladder as seen previously. Stomach/Bowel: Stomach appears normal. Question wall thickening in a  segmental region of the ileum, possibly ischemic insult with this clinical history. No evidence of frank necrosis. No dilated small bowel. Appendix is normal. There may be mild edema of the right colon as well. Mild diverticulosis of the left colon. Vascular/Lymphatic: Aorta and IVC are normal. No adenopathy. Mild nonspecific retroperitoneal edema, often seen in critically ill patients lying supine. Reproductive: Normal Other: Tiny amount of free intraperitoneal fluid. Apparent muscular swelling of the iliopsoas and gluteal muscles that could go along with the clinical history of rhabdomyolysis. There may be some subtle hyperdensity within the right ileo psoas musculature consistent with some intramuscular hemorrhage. No large hematoma. IMPRESSION: Redemonstration of patchy bilateral peripheral lower lobe airspace density which could be due to pneumonia or pulmonary infarctions. No progressive change. Redemonstration of pneumomediastinum and soft tissue air within the anterior chest, presumably subsequent to resuscitation. No visible pneumothorax in the region studied. Redemonstration of bowel wall edema the distal ileum and possibly the proximal ascending colon, possibly ischemic in this setting. No progressive change or evidence of frank bowel necrosis. Enlarged kidneys consistent with the history of acute renal failure. No sign of obstruction. Muscular swelling, particularly of the ileo psoas and gluteal muscles consistent with the clinical history of rhabdomyolysis. Some hemorrhage noted within the right iliopsoas muscle, which I would not categorize as a frank or significant hematoma at this time. Electronically Signed   By: Paulina FusiMark  Shogry M.D.   On: 06/01/2021 11:33   DG Abd 1 View  Result Date: 06/01/2021 CLINICAL DATA:  46 year old male with history of acute abdominal pain. Bright red blood in stool since Friday. EXAM: ABDOMEN - 1 VIEW COMPARISON:  Abdominal radiograph 05/28/2021. FINDINGS: There are  several gas-filled loops of small bowel throughout the abdomen, most evident in the upper abdomen, where some of these bowel loops are mildly distended measuring up to 3.7 cm in diameter. There is a relative paucity of colonic and rectal gas. No frank pneumoperitoneum. IMPRESSION: 1. Nonspecific bowel-gas pattern, as above, which could be indicative of early or partial small bowel obstruction. 2. No pneumoperitoneum. Electronically Signed   By: Trudie Reedaniel  Entrikin M.D.   On: 06/01/2021 06:37   MR BRAIN W WO CONTRAST  Result Date: 05/31/2021 CLINICAL DATA:  Acute neuro deficit. Rule out stroke. Respiratory arrest. Leukocytosis. EXAM: MRI HEAD WITHOUT AND WITH CONTRAST TECHNIQUE: Multiplanar, multiecho pulse sequences of the brain and surrounding structures were obtained without and with intravenous contrast. CONTRAST:  8.718mL GADAVIST GADOBUTROL 1 MMOL/ML IV SOLN COMPARISON:  CT and 05/30/2021 FINDINGS: Brain: No acute infarction, hemorrhage, hydrocephalus, extra-axial collection or mass lesion. Normal white matter. Normal enhancement postcontrast infusion. Vascular: Normal arterial flow voids. Skull and upper cervical spine: Negative Sinuses/Orbits: Mucosal edema paranasal  sinuses. Near complete opacification left sphenoid sinus with air-fluid level. Other: Motion degraded study IMPRESSION: Motion degraded exam.  Normal MRI of the brain with contrast. Mucosal edema paranasal sinuses with air-fluid level left sphenoid sinus Electronically Signed   By: Marlan Palau M.D.   On: 05/31/2021 16:32   MR CERVICAL SPINE W WO CONTRAST  Result Date: 05/31/2021 CLINICAL DATA:  Rule out CSF leak. Spontaneous intracranial hypotension. Cardiac arrest. Leukocytosis. EXAM: MRI CERVICAL SPINE WITHOUT AND WITH CONTRAST TECHNIQUE: Multiplanar and multiecho pulse sequences of the cervical spine, to include the craniocervical junction and cervicothoracic junction, were obtained without and with intravenous contrast. CONTRAST:  8.30mL  GADAVIST GADOBUTROL 1 MMOL/ML IV SOLN COMPARISON:  None. FINDINGS: Alignment: Normal Vertebrae: Normal bone marrow. Negative for fracture or mass. No evidence of spinal infection. Cord: Normal signal morphology. Posterior Fossa, vertebral arteries, paraspinal tissues: Mega cisterna magna. No soft tissue fluid collection in the cervical spine. Disc levels: Small right paracentral disc protrusion at C5-6 without significant stenosis. Remaining disc spaces normal. Image quality degraded by motion IMPRESSION: Motion degraded study. Small right-sided disc protrusion at C5-6 Negative for spinal infection or fluid collection in the cervical spine. Electronically Signed   By: Marlan Palau M.D.   On: 05/31/2021 16:35   MR THORACIC SPINE W WO CONTRAST  Result Date: 05/31/2021 CLINICAL DATA:  Acute myelopathy.  Cardiac arrest.  Leukocytosis EXAM: MRI THORACIC WITHOUT AND WITH CONTRAST TECHNIQUE: Multiplanar and multiecho pulse sequences of the thoracic spine were obtained without and with intravenous contrast. CONTRAST:  8.66mL GADAVIST GADOBUTROL 1 MMOL/ML IV SOLN COMPARISON:  CT thoracic spine 05/30/2021 FINDINGS: Alignment:  Normal Vertebrae: Normal bone marrow. Negative for fracture or mass. No evidence of thoracic spine infection. Cord: Normal signal and morphology. No cord compression. Cord signal evaluation is limited by motion. Paraspinal and other soft tissues: Small right pleural effusion. No paraspinous mass or edema. Disc levels: There is significant disc degeneration. No thoracic spinal stenosis. IMPRESSION: Motion degraded study.  No significant abnormality thoracic spine. Electronically Signed   By: Marlan Palau M.D.   On: 05/31/2021 16:29   MR Lumbar Spine W Wo Contrast  Result Date: 05/31/2021 CLINICAL DATA:  Acute myelopathy.  Leukocytosis.  Cardiac arrest  . EXAM: MRI LUMBAR SPINE WITHOUT AND WITH CONTRAST TECHNIQUE: Multiplanar and multiecho pulse sequences of the lumbar spine were obtained  without and with intravenous contrast. CONTRAST:  8.31mL GADAVIST GADOBUTROL 1 MMOL/ML IV SOLN COMPARISON:  CT lumbar spine 05/30/2021 FINDINGS: Segmentation:  5 lumbar vertebra. Alignment:  Normal Vertebrae: Negative for fracture or mass. No evidence of discitis osteomyelitis. Conus medullaris and cauda equina: Conus extends to the L1-2 level. Conus and cauda equina appear normal. Paraspinal and other soft tissues: Enlargement of the right iliopsoas muscles. Complex fluid collection right psoas muscle measuring 3.2 cm. The fluid collection is hypointense on T2 and T1. Smaller adjacent fluid collection is seen inferior to the larger collection. There is enhancement of the right iliopsoas muscle around the fluid collections. Mild stranding in the tissues surrounding the muscles. Disc levels: L1-2: Negative L2-3: Negative L3-4: Small central disc protrusion and bilateral facet degeneration. Mild subarticular stenosis bilaterally L4-5: Disc bulging and facet degeneration.  Negative for stenosis L5-S1: Mild disc bulging.  Negative for stenosis. Image quality degraded by motion. IMPRESSION: Multiple fluid collections in the right ileo psoas muscle. The largest collection is 3.2 cm. There is surrounding enhancement and enlargement of the muscles. Findings most likely due to hematoma or abscess. The patient has  had a right femoral vascular catheter. No evidence of discitis osteomyelitis in the lumbar spine. Motion degraded study. Electronically Signed   By: Marlan Palau M.D.   On: 05/31/2021 16:22   DG CHEST PORT 1 VIEW  Result Date: 06/01/2021 CLINICAL DATA:  Placement of central venous catheter EXAM: PORTABLE CHEST 1 VIEW COMPARISON:  05/30/2021 FINDINGS: Transverse diameter of heart is increased. There is interval decrease in pulmonary vascular congestion and pulmonary edema. There is poor inspiration. There are no new focal infiltrates. There is interval placement of large caliber right IJ central venous catheter  with its tip in the superior vena cava close to the right atrium. There is no significant pleural effusion or pneumothorax. IMPRESSION: There is interval decrease in pulmonary vascular congestion and pulmonary edema. There is no pneumothorax. Tip of right IJ central venous catheter is seen in the superior vena cava close to the right atrium. Electronically Signed   By: Ernie Avena M.D.   On: 06/01/2021 16:22   Medications:  sodium chloride Stopped (05/29/21 1602)   sodium chloride Stopped (05/30/21 1023)   cefTRIAXone (ROCEPHIN)  IV 2 g (06/01/21 1607)   dexmedetomidine (PRECEDEX) IV infusion Stopped (05/31/21 1601)   lactated ringers Stopped (05/31/21 1954)   metronidazole 500 mg (06/01/21 2213)    B-complex with vitamin C  1 tablet Per Tube Daily   Chlorhexidine Gluconate Cloth  6 each Topical Q0600   Chlorhexidine Gluconate Cloth  6 each Topical Q0600   darbepoetin (ARANESP) injection - NON-DIALYSIS  100 mcg Subcutaneous Q Mon-1800   feeding supplement  237 mL Oral BID BM   folic acid  1 mg Per Tube Daily   heparin injection (subcutaneous)  5,000 Units Subcutaneous Q8H   mouth rinse  15 mL Mouth Rinse BID   pantoprazole (PROTONIX) IV  40 mg Intravenous Q12H    Assessment/ Plan: AKI severe: in setting of cardiac arrest, rhabdo.  Oliguric to anuric.  Required CRRT 1/15 - 1/19.  Anuric despite high dose lasix 1/20.  HD 1/22 via femoral HD catheter-  another HD treatment planned for today - 1/24-  new line placed SP cardiac arrest - asystolic arrest in setting of prob asp PNA/ ARDS in setting of EtOH Severe metabolic/ resp acidosis -   improved but now worsening again-  HD should help-  concern about ischemic bowel as well  AHRF - suspected aspiration pneumonitis w/ ARDS, on antibiotics per primary.  Now on [place] Hepatitis - shock, EtOH, rhabdo contributors; per primary AMS - improving but remains encephalopathic.   LE neurologic deficits - ortho and neuro seeing, imaging pending  today Possible ischemic bowel - surgery consulting-  OR today ?   Anemia- hgb stable in the 8's for 48 hours -  no meds yet-  iron stores low but defer iron right now given high WBC- giving ESA -  transfuse PRN    Andres Braun  06/02/2021, 9:09 AM  [facility] Pager: [phone]

## 2021-06-02 NOTE — Progress Notes (Signed)
Pharmacy Antibiotic Note  Andres Braun is a 46 y.o. male admitted on 05/24/2021 with  ? intraabdominal infection .  Pharmacy has been consulted for zosyn dosing.  Has been on ceftriaxone since 1/15 + metronidazole since 1/21, now with rising WBC and worsening abdominal pain.  Possible diagnostic laparoscopy with possible exploratory laparotomy, bowel resection, and possible ileostomy planned today.  Pt is ESRD with last HD 1/22; planned for another session today.  Plan: Stop ceftriaxone and metronidazole Start Zosyn 2.25g IV q 8 hrs (ESRD dosing)  Height: 6' (182.9 cm) Weight: 88.7 kg (195 lb 8.8 oz) IBW/kg (Calculated) : 77.6  Temp (24hrs), Avg:98.7 F (37.1 C), Min:98.3 F (36.8 C), Max:99.3 F (37.4 C)  Recent Labs  Lab 05/26/21 1307 05/26/21 1530 05/29/21 0253 05/30/21 0340 05/30/21 0918 05/30/21 1043 05/30/21 1625 05/30/21 2330 05/31/21 0402 05/31/21 1544 06/01/21 0125 06/01/21 0922 06/01/21 1126 06/02/21 0440  WBC  --    < > 13.3* 16.7*  --   --  19.4* 19.5*  --  18.3* 18.8*  --   --  24.9*  CREATININE  --    < > 4.23* 6.54*  --   --   --   --  8.59*  --  7.31*  --   --  10.51*  LATICACIDVEN 3.3*  --   --   --  1.4 1.2  --   --   --   --   --  1.0 1.1  --    < > = values in this interval not displayed.    Estimated Creatinine Clearance: 9.7 mL/min (A) (by C-G formula based on SCr of 10.51 mg/dL (H)).    No Known Allergies  Antimicrobials this admission: CTX 1/15 > 1/19, 1/21>> 1/24 Metronidazole 1/21>> 1/24 Zosyn 1/24 >   Dose adjustments this admission:  Microbiology results: 1/23 GI panel >   Thank you for allowing pharmacy to be a part of this patients care.  Andres Braun, Andres Braun, [facility] Clinical Pharmacist  06/02/2021 10:29 AM   [facility] pharmacy phone numbers are listed on [email or web address]

## 2021-06-03 ENCOUNTER — Inpatient Hospital Stay (HOSPITAL_COMMUNITY): Payer: Self-pay

## 2021-06-03 ENCOUNTER — Encounter (HOSPITAL_COMMUNITY): Payer: Self-pay | Admitting: General Surgery

## 2021-06-03 DIAGNOSIS — I469 Cardiac arrest, cause unspecified: Secondary | ICD-10-CM

## 2021-06-03 DIAGNOSIS — E43 Unspecified severe protein-calorie malnutrition: Secondary | ICD-10-CM

## 2021-06-03 LAB — CBC
HCT: 24.4 % — ABNORMAL LOW (ref 39.0–52.0)
Hemoglobin: 8 g/dL — ABNORMAL LOW (ref 13.0–17.0)
MCH: 27.4 pg (ref 26.0–34.0)
MCHC: 32.8 g/dL (ref 30.0–36.0)
MCV: 83.6 fL (ref 80.0–100.0)
Platelets: 457 10*3/uL — ABNORMAL HIGH (ref 150–400)
RBC: 2.92 MIL/uL — ABNORMAL LOW (ref 4.22–5.81)
RDW: 14 % (ref 11.5–15.5)
WBC: 23.3 10*3/uL — ABNORMAL HIGH (ref 4.0–10.5)
nRBC: 0 % (ref 0.0–0.2)

## 2021-06-03 LAB — RENAL FUNCTION PANEL
Albumin: 1.9 g/dL — ABNORMAL LOW (ref 3.5–5.0)
Anion gap: 19 — ABNORMAL HIGH (ref 5–15)
BUN: 130 mg/dL — ABNORMAL HIGH (ref 6–20)
CO2: 16 mmol/L — ABNORMAL LOW (ref 22–32)
Calcium: 7.7 mg/dL — ABNORMAL LOW (ref 8.9–10.3)
Chloride: 96 mmol/L — ABNORMAL LOW (ref 98–111)
Creatinine, Ser: 8.78 mg/dL — ABNORMAL HIGH (ref 0.61–1.24)
GFR, Estimated: 7 mL/min — ABNORMAL LOW (ref >60)
Glucose, Bld: 103 mg/dL — ABNORMAL HIGH (ref 70–99)
Phosphorus: 11.6 mg/dL — ABNORMAL HIGH (ref 2.5–4.6)
Potassium: 4.6 mmol/L (ref 3.5–5.1)
Sodium: 131 mmol/L — ABNORMAL LOW (ref 135–145)

## 2021-06-03 LAB — CK: Total CK: 1166 U/L — ABNORMAL HIGH (ref 49–397)

## 2021-06-03 LAB — PREALBUMIN: Prealbumin: 15.3 mg/dL — ABNORMAL LOW (ref 18–38)

## 2021-06-03 IMAGING — DX DG CHEST 1V PORT
1 series · 1 of 1 positions shown · non-contrast
Comparison: [DATE].

CLINICAL DATA: Cardiac arrest, chest soreness, pneumomediastinum.

EXAM:
PORTABLE CHEST 1 VIEW

[chest ap]
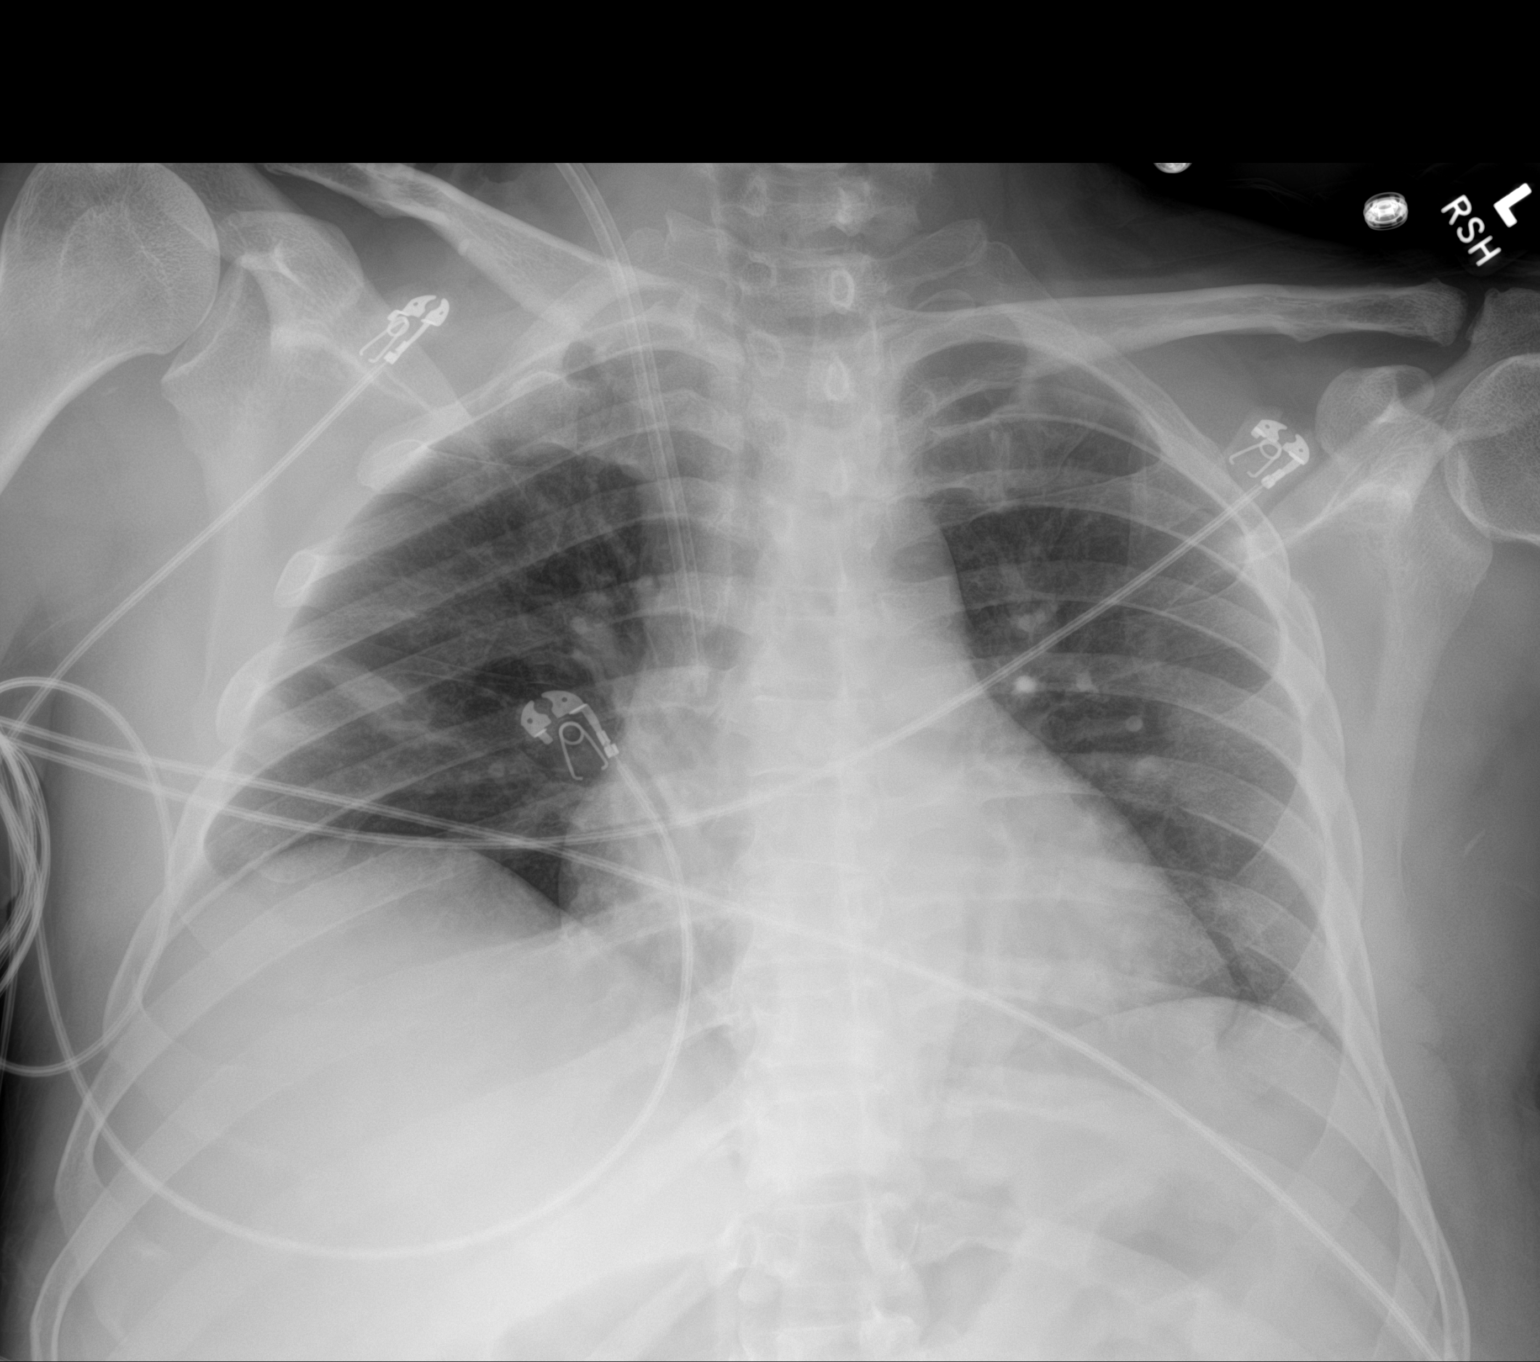

[1 of 1 positions shown; findings below may reference images not displayed]

FINDINGS: Right IJ central line tip is in the SVC. Heart size stable. Lungs
are low in volume with platelike density in the right perihilar
region. There may be additional subsegmental airspace opacification
in the left lower lobe. No appreciable residual pneumomediastinum.
IMPRESSION: 1. Low lung volumes with subsegmental atelectasis in the right
perihilar region and left lower lobe.
2. No definite residual pneumomediastinum.

## 2021-06-03 IMAGING — MR MR FEMUR*L* WO/W CM
4 of 11 series · 13 of 40 positions shown · IV contrast (g gad)
Comparison: Abdominopelvic CT [DATE] and [DATE].

CLINICAL DATA: Left thigh swelling with concern for potential
compartment syndrome causing femoral nerve deficit. Muscular
swelling on CT suspicious for rhabdomyolysis. Soft tissue infection
suspected.

EXAM:
MR OF THE LEFT LOWER EXTREMITY WITHOUT AND WITH CONTRAST
TECHNIQUE: Multiplanar, multisequence MR imaging of the left thigh was
performed both before and after administration of intravenous
contrast.
CONTRAST:  8mL GADAVIST GADOBUTROL 1 MMOL/ML IV SOLN

[Series 5: T1 · coronal · 6.0mm · 0.49mm/px · 2 of 32 slices shown (1 of 2)]
[im 1/32]
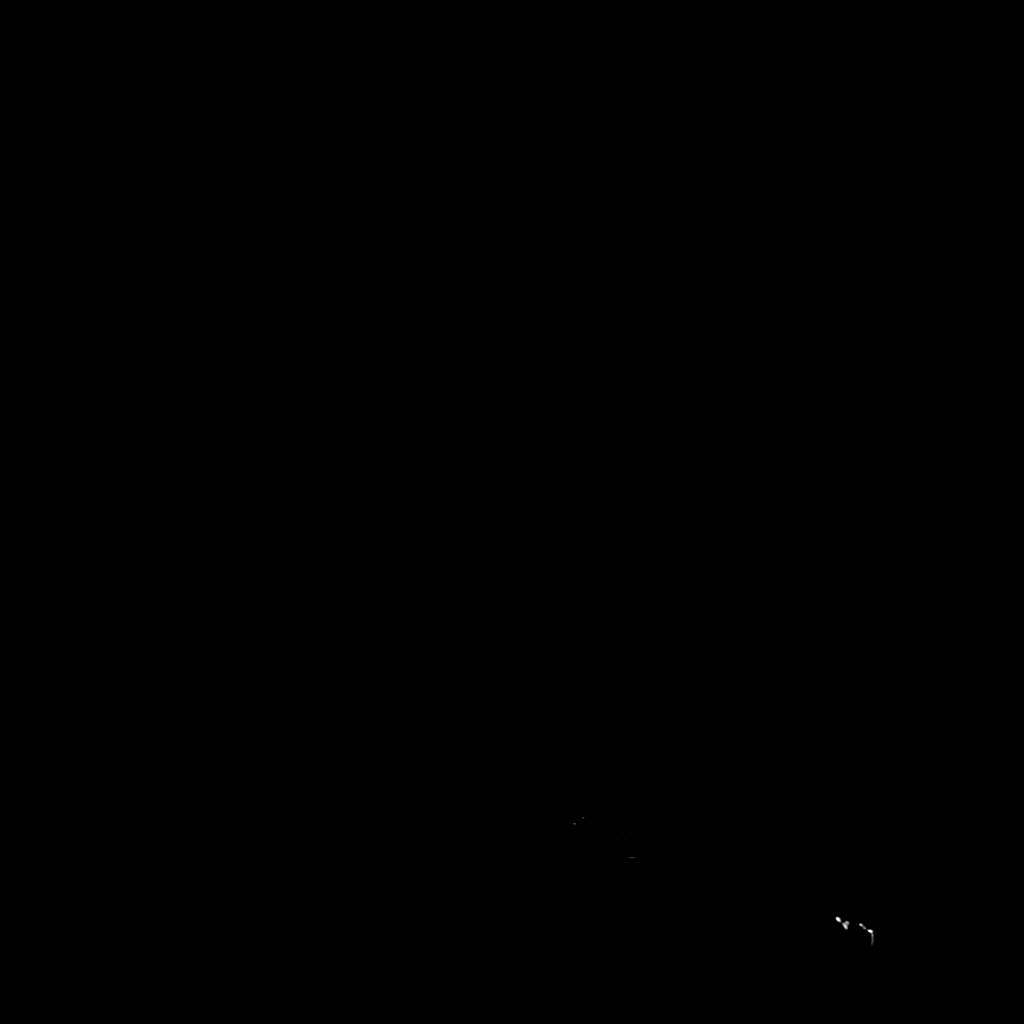
[im 32/32]
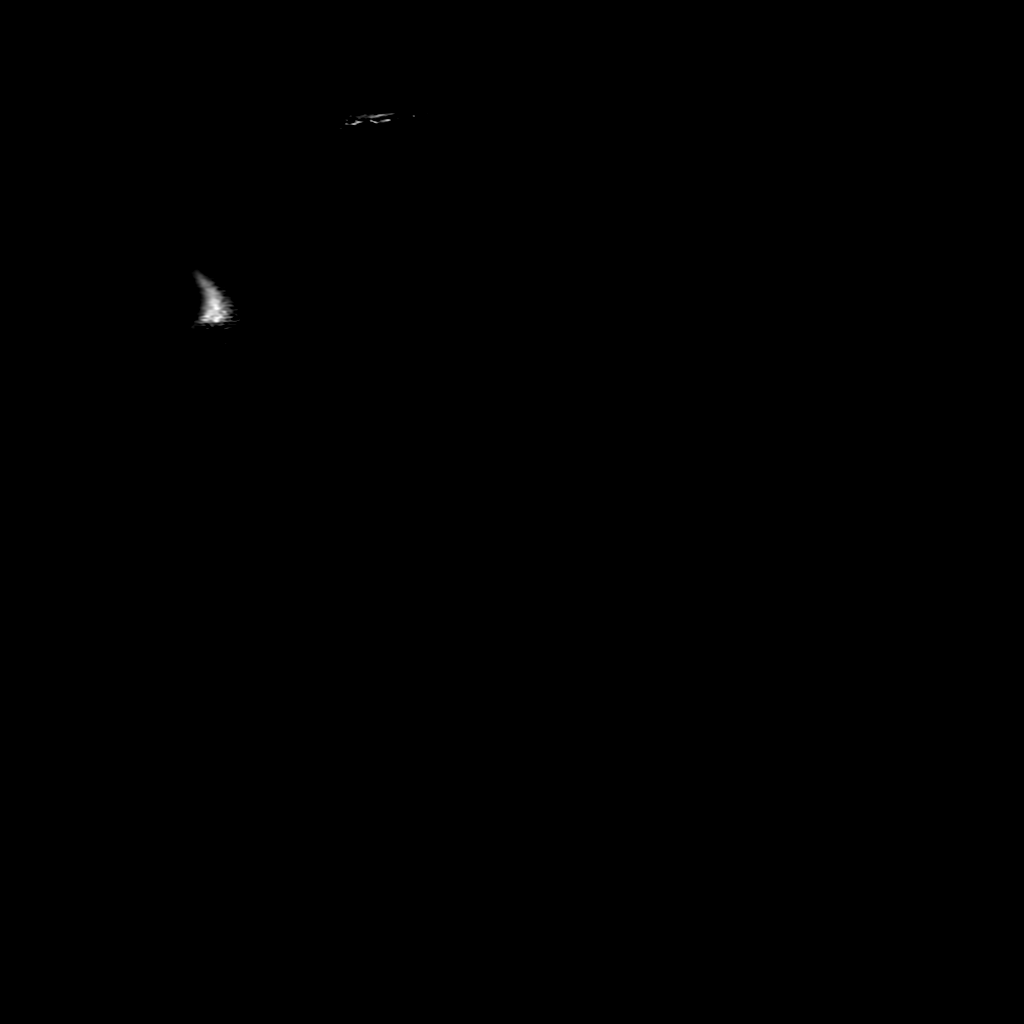

[Series 6: T1 · axial · 7.0mm · 0.51mm/px · z∈[-177,+230]mm · 3 of 64 slices shown (2 of 2)]
[im 13/64]
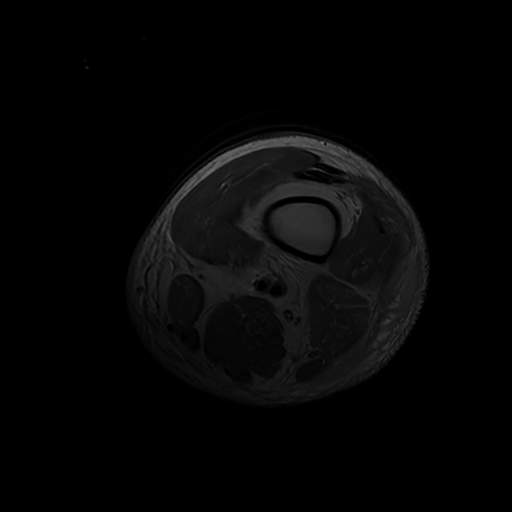
[im 38/64]
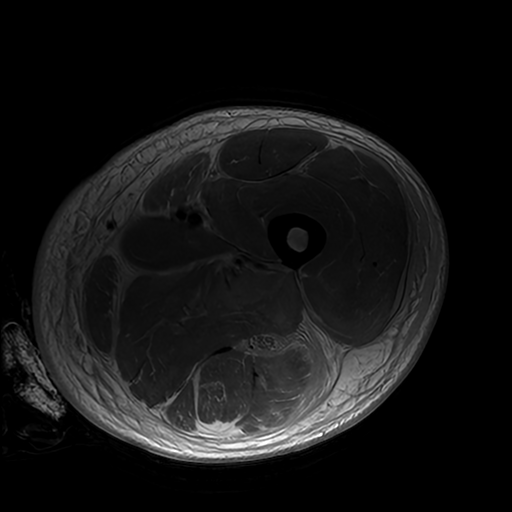
[im 64/64]
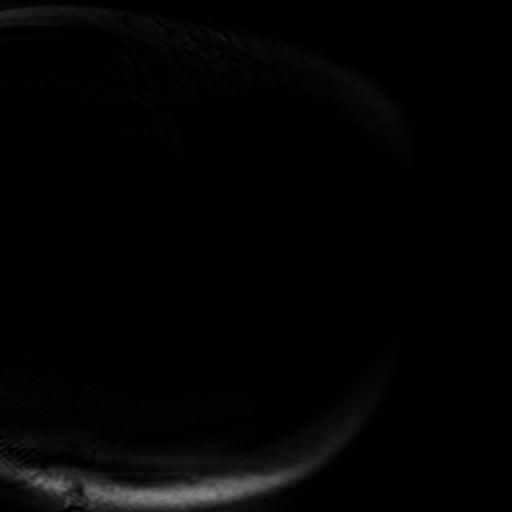

[Series 7: T2 fat-sat · axial · 7.0mm · 0.51mm/px · z∈[-273,+230]mm · 5 of 64 slices shown]
[im 1/64]
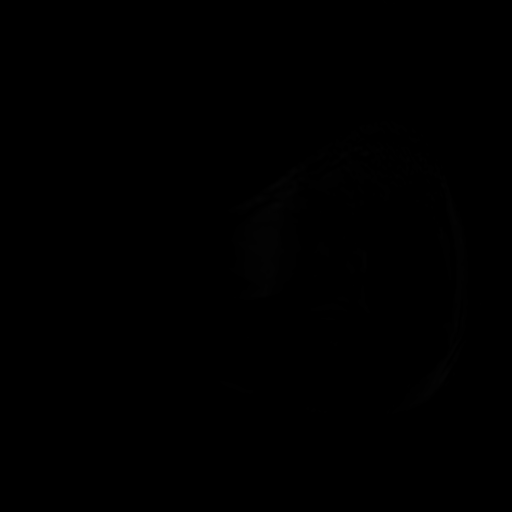
[im 13/64]
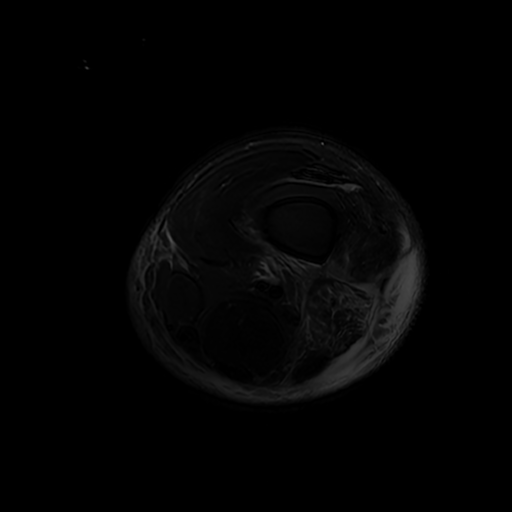
[im 26/64]
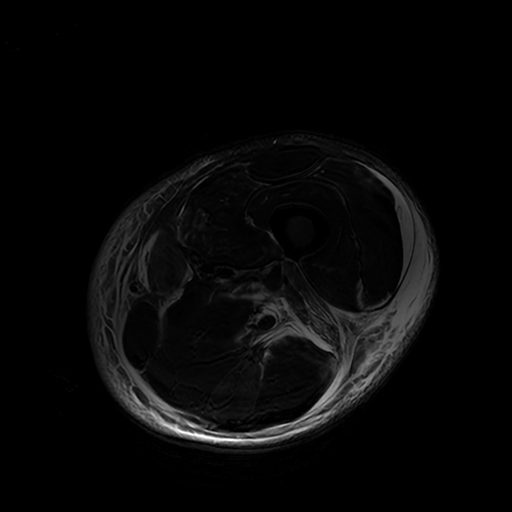
[im 38/64]
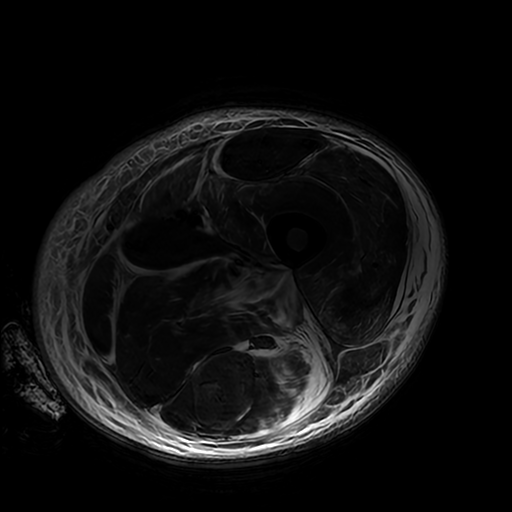
[im 64/64]
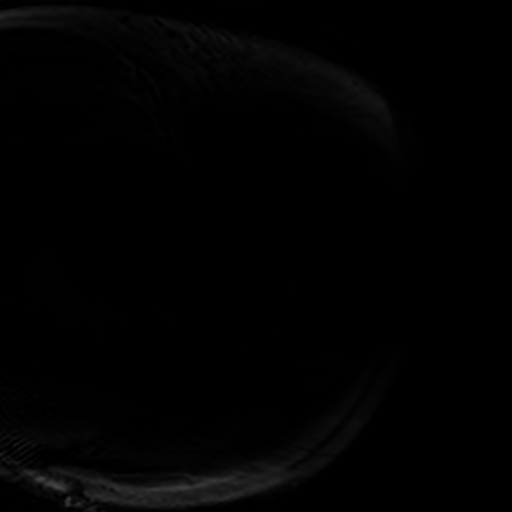

[Series 9: T1 post-contrast · axial · 7.0mm · 0.51mm/px · z∈[-177,+230]mm · 3 of 64 slices shown]
[im 13/64]
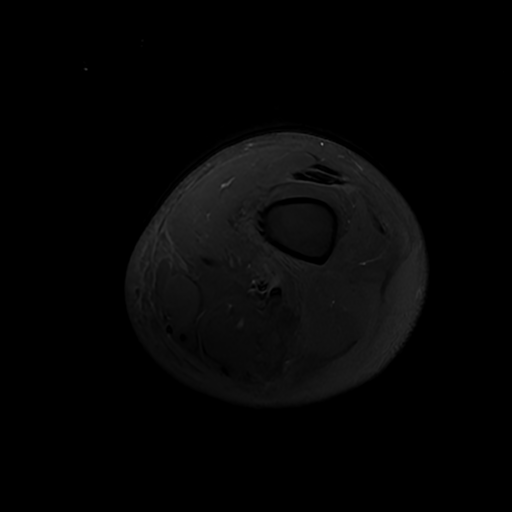
[im 38/64]
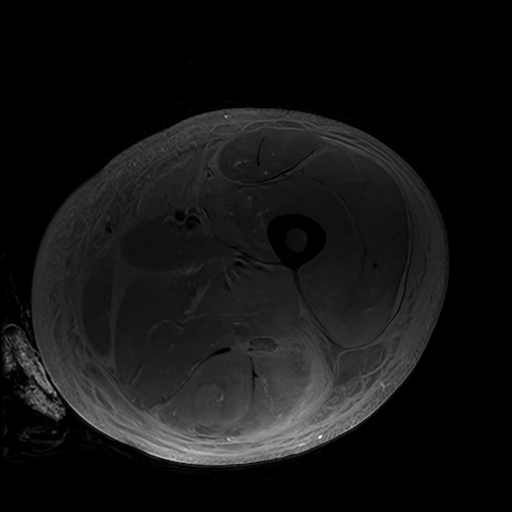
[im 64/64]
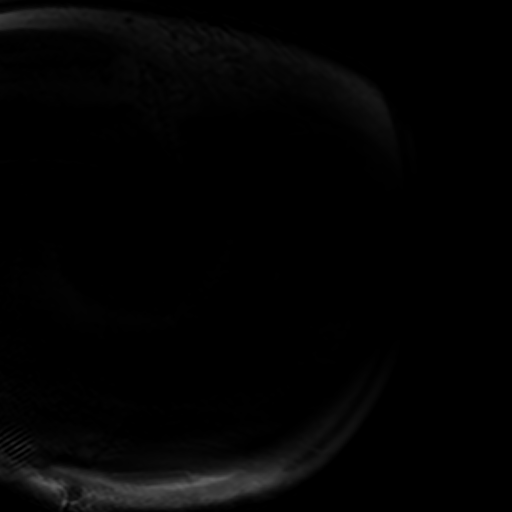

[13 of 40 positions shown; findings below may reference images not displayed]

FINDINGS: Bones/Joint/Cartilage

No evidence of acute fracture, dislocation or bone destruction.
There is no abnormal osseous signal or enhancement. There is no
significant hip joint effusion. The knee joint is incompletely
visualized.

Ligaments

Not relevant for exam/indication.

Muscles and Tendons

There is generalized T2 hyperintensity throughout the left thigh
musculature which is greatest inferiorly in the left gluteus maximus
muscle. The muscle is enlarged with heterogeneous T2 signal and
heterogeneous enhancement following contrast. There are irregular
areas of decreased enhancement within the muscle which may reflect
necrosis. No well-defined abscess demonstrated. Similar
heterogeneous enhancement is present within the quadratus femoris
muscle. Lesser enhancement is seen throughout the additional left
thigh musculature. There is also some edema and enhancement within
the visualized right thigh musculature.

Soft tissues

Moderate generalized subcutaneous edema throughout the left thigh
without focal fluid collection or foreign body. No evidence of soft
tissue emphysema. There is heterogeneous T2 hyperintensity and
enhancement surrounding the sciatic nerve which appears normal in
caliber and signal. No vascular abnormalities are apparent. Soft
tissue edema is noted in the lower pelvis.
IMPRESSION: 1. Diffuse muscular abnormalities throughout the visualized portions
of both thighs with heterogeneous T2 signal and enhancement,
especially within the left gluteus maximus and quadratus femoris
muscles. Findings are compatible with the diagnosis of
rhabdomyolysis, with the differential including myositis related to
connective tissue disease and polymyositis. No drainable abscess
identified.
2. Associated diffuse subcutaneous edema which is nonspecific and
may reflect cellulitis.
3. These findings could contribute to compartment syndrome in the
appropriate clinical context. This requires clinical evaluation to
confirm/exclude.
4. No evidence of osteomyelitis or septic arthritis.

## 2021-06-03 MED ORDER — GERHARDT'S BUTT CREAM
TOPICAL_CREAM | Freq: Three times a day (TID) | CUTANEOUS | Status: DC
  Administered 2021-06-09 – 2021-06-20 (×4): 1 via TOPICAL
  Filled 2021-06-03 (×4): qty 1

## 2021-06-03 MED ORDER — RENA-VITE PO TABS
1.0000 | ORAL_TABLET | Freq: Every day | ORAL | Status: DC
  Administered 2021-06-03 – 2021-07-06 (×33): 1 via ORAL
  Filled 2021-06-03 (×34): qty 1

## 2021-06-03 MED ORDER — ENSURE ENLIVE PO LIQD
237.0000 mL | Freq: Three times a day (TID) | ORAL | Status: DC
  Administered 2021-06-03 – 2021-06-08 (×11): 237 mL via ORAL

## 2021-06-03 MED ORDER — GADOBUTROL 1 MMOL/ML IV SOLN
8.0000 mL | Freq: Once | INTRAVENOUS | Status: AC | PRN
  Administered 2021-06-03: 09:00:00 8 mL via INTRAVENOUS

## 2021-06-03 MED ORDER — GUAIFENESIN-DM 100-10 MG/5ML PO SYRP: 5.0000 mL | ORAL_SOLUTION | ORAL | Status: DC | PRN

## 2021-06-03 MED ORDER — CHLORHEXIDINE GLUCONATE CLOTH 2 % EX PADS
6.0000 | MEDICATED_PAD | Freq: Every day | CUTANEOUS | Status: DC
  Administered 2021-06-04 – 2021-06-05 (×2): 6 via TOPICAL

## 2021-06-03 NOTE — Progress Notes (Signed)
Physical Therapy Treatment Patient Details Name: Andres Braun MRN: [id number] DOB: 1976/04/09 Today's Date: 06/03/2021   History of Present Illness Pt is a 46 y.o. male admitted 05/24/21 after cardiac arrest at home; pt with agonal breathing, lost pulse, required 20-min CPR and 5 rounds epi. Workup for acute hypoxic respiratory failure, septic shock due to undertermined organism, AKI, acute metabolic encephalopathy, aspiration PNA of bilateral lower lobes due to vomit. ETT 1/15-1/19. CRRT 1/15-1/20. Brain MRI 1/22 negative. Cervical MRI 1/22 with small R-side disc protrusion C5-6. Abdominal CT showed muscular swelling, particularly iliopsoas and gluteal muscles consistent with rhabdomyolysis; hemorrhage noted within R iliopsoas. S/p diagnostic laparoscopy 1/24. MRI L femur 1/25 showed diffuse muscular abnormalities bilateral thighs with heterogenous T2 signal, especially within L glut max and gradratus femoris; findings compatible with dx of rhabdomyolysis, no drainable abscess identified; these findings could contribute to compartment syndrome. Unknown PMH.   PT Comments    Pt progressing with mobility, still with no LLE muscle activation noted this session; pt endorses burning sensation throughout LLE. Today's session focused on standing trials in [place], pt tolerated well; pt pleasant and motivated to participate. Pt remains limited by BLE weakness, poor balance strategies/postural reactions and impaired cognition. Continue to recommend intensive CIR-level therapies to maximize functional mobility and independence prior to return home.  Session performed with Spanish ipad video interpreter; would benefit from in-person interpreter next session    Recommendations for follow up therapy are one component of a multi-disciplinary discharge planning process, led by the attending physician.  Recommendations may be updated based on patient status, additional functional criteria and insurance  authorization.  Follow Up Recommendations  Acute inpatient rehab (3hours/day)     Assistance Recommended at Discharge Frequent or constant Supervision/Assistance  Patient can return home with the following Two people to help with walking and/or transfers;Help with stairs or ramp for entrance;A lot of help with bathing/dressing/bathroom;Assistance with cooking/housework;Assist for transportation   Equipment Recommendations  Wheelchair (measurements PT);Wheelchair cushion (measurements PT) - TBD    Recommendations for Other Services  Rehab consult     Precautions / Restrictions Precautions Precautions: Fall Restrictions Weight Bearing Restrictions: No     Mobility  Bed Mobility Overal bed mobility: Needs Assistance Bed Mobility: Rolling, Supine to Sit, Sit to Supine Rolling: Mod assist   Supine to sit: Mod assist, +2 for physical assistance Sit to supine: Mod assist, +2 for physical assistance, Total assist   General bed mobility comments: pt initiates the task of rolling but unable to control momentum. pt requires modA+2 for supine to sit as pt nearly rolls himself off EOB. pt was using force of rolling to force L side of the body to follow for the transfer. pt with no LLE activation during the task; pt returning to supine with no control of descent to pillow and totalA for BLE management    Transfers Overall transfer level: Needs assistance Equipment used:  (stedy) Transfers: Sit to/from Stand Sit to Stand: Mod assist, +2 physical assistance, +2 safety/equipment           General transfer comment: pt following steps to sit<>Stand from bed surface with counting to 3 in spanish. pt initially unable to sustain static standing. pt with increased time compensating with R lateral lean and all weight on R LE for >30 seconds. pt with uncontrolled descent to sitting. pt talking and attempting to motion with RUE and immediate LOB to sitting and only after buttock touched bed surface  reacting with BUE to attempt to  catch himself. pt sit<>stand >4 times during session    Ambulation/Gait                   Stairs             Wheelchair Mobility    Modified Rankin (Stroke Patients Only)       Balance Overall balance assessment: Needs assistance Sitting-balance support: Bilateral upper extremity supported, Feet supported Sitting balance-Leahy Scale: Poor Sitting balance - Comments: heavy reliance on UE support to maintain static sitting EOB, pt reluctant to pick R foot off floor due to fear of LOB ("I can't, I can't"), intermittent min-modA to prevent LOB Postural control: Right lateral lean Standing balance support: Bilateral upper extremity supported, Reliant on assistive device for balance, During functional activity Standing balance-Leahy Scale: Zero Standing balance comment: Requires BUE support, external assist and stedy frame to maintain static standing; poor awareness of weight shift onto L side and inability to correct                            Cognition Arousal/Alertness: Awake/alert Behavior During Therapy: WFL for tasks assessed/performed Overall Cognitive Status: No family/caregiver present to determine baseline cognitive functioning Area of Impairment: Orientation, Following commands, Attention, Safety/judgement, Awareness, Problem solving, Memory                 Orientation Level: Disoriented to, Place, Time, Situation Current Attention Level: Sustained Memory: Decreased recall of precautions, Decreased short-term memory Following Commands: Follows one step commands inconsistently Safety/Judgement: Decreased awareness of safety, Decreased awareness of deficits Awareness: Intellectual Problem Solving: Slow processing, Difficulty sequencing General Comments: Pt reports being in hospital in [place] and that he came here because he drank 4 beers. pt aware of LB weakness but poorly describing deficits. pt spanish  speaking only and needs questions redirected several times for translation. pt at one point answers a question in sounds and mumbles and no translation to be provided by interpreter. Pt agreeable and following 1 step commands. pt unaware of body in space therefore movements appear to be impulsive when pt is trying to recruit all movement possible. pt nearly rolling himself off the bed surface attempt the command sit EOB on the right. pt with very delayed reactions to LOB        Exercises Other Exercises Other Exercises: attempting LLE muscle activation supine and standing with multiple task - none noted    General Comments General comments (skin integrity, edema, etc.): Session performed via ipad Spanish interpreter      Pertinent Vitals/Pain Pain Assessment Pain Assessment: Faces Faces Pain Scale: Hurts even more Pain Location: BLEs (L>R) Pain Descriptors / Indicators: Burning Pain Intervention(s): Monitored during session, Limited activity within patient's tolerance    Home Living                          Prior Function            PT Goals (current goals can now be found in the care plan section) Acute Rehab PT Goals Patient Stated Goal: get moving more PT Goal Formulation: With patient Progress towards PT goals: Progressing toward goals    Frequency    Min 3X/week      PT Plan Current plan remains appropriate    Co-evaluation PT/OT/SLP Co-Evaluation/Treatment: Yes Reason for Co-Treatment: Complexity of the patient's impairments (multi-system involvement);Necessary to address cognition/behavior during functional activity;For patient/therapist safety;To address functional/ADL transfers  PT goals addressed during session: Mobility/safety with mobility;Balance;Proper use of DME OT goals addressed during session: ADL's and self-care;Proper use of Adaptive equipment and DME;Strengthening/ROM      AM-PAC PT "6 Clicks" Mobility   Outcome Measure  Help needed  turning from your back to your side while in a flat bed without using bedrails?: Total Help needed moving from lying on your back to sitting on the side of a flat bed without using bedrails?: Total Help needed moving to and from a bed to a chair (including a wheelchair)?: Total Help needed standing up from a chair using your arms (e.g., wheelchair or bedside chair)?: Total Help needed to walk in hospital room?: Total Help needed climbing 3-5 steps with a railing? : Total 6 Click Score: 6    End of Session Equipment Utilized During Treatment: Gait belt Activity Tolerance: Patient tolerated treatment well Patient left: in bed;with call bell/phone within reach;with bed alarm set Nurse Communication: Mobility status PT Visit Diagnosis: Other abnormalities of gait and mobility (R26.89);Muscle weakness (generalized) (M62.81);Difficulty in walking, not elsewhere classified (R26.2)     Time: [id number] PT Time Calculation (min) (ACUTE ONLY): 30 min  Charges:  $Therapeutic Activity: 8-22 mins                     Ina HomesJaclyn Adrionna Delcid, PT, DPT Acute Rehabilitation Services  Pager [phone] Office [phone]  Malachy ChamberJaclyn L Carmela Piechowski 06/03/2021, 4:52 PM

## 2021-06-03 NOTE — Progress Notes (Addendum)
Occupational Therapy Treatment Patient Details Name: Andres Braun MRN: [id number] DOB: 1976-01-29 Today's Date: 06/03/2021   History of present illness 46 y.o. male admitted 1/15 after cardiac arrest at home. Pt with agonal he breathing, lost pulse, 20 minutes of CPR, 5 rounds of epinephrine,acute respiratory failure with hypoxia and hypercapnia, septic shock due to undetermined organism, AKI,   acute metabolic encephalopathy,  aspiration pneumonia of both lower lobes due to vomit. MRI brain: negative 1/22, MRI c-cpine: Small right-sided disc protrusion at C5-6 1/22. CT abdomen/pelvis: negative for bowel obstruction; postive for muscular swelling, particularly of the ileo psoas and gluteal  muscles consistent with the clinical history of rhabdomyolysis. Some hemorrhage noted within the right iliopsoas muscle MRI1/25/23 LLE Diffuse muscular abnormalities both thighs with heterogeneous T2 signal and enhancement,  especially within the left gluteus maximus and quadratus femoris  muscles. Findings are compatible with the diagnosis of  rhabdomyolysis, No drainable abscess  identified.  2. These findings could contribute to compartment syndrome . PMHx - unknown.   OT comments  Pt tolerated sit<>Stand in stedy this session >4 attempts with progressive improvement with each attempt. Pt lacks activation of muscles to the LLE at this time. Pt motivated and very cooperative throughout session. Pt personally motivated to attempt transfers to be able to to return to toilet level use for voiding. Recommendation remains CIR due to bil LE deficits cognitive and balance deficits. Pt correctly used the call bell when asked how to call for help showing some carry over of information from staff prior to this session.   Goes by Andres Braun Recommend calling in person staff to interpret if possible.    Recommendations for follow up therapy are one component of a multi-disciplinary discharge planning process, led by the  attending physician.  Recommendations may be updated based on patient status, additional functional criteria and insurance authorization.    Follow Up Recommendations  Acute inpatient rehab (3hours/day)    Assistance Recommended at Discharge Frequent or constant Supervision/Assistance  Patient can return home with the following  Two people to help with walking and/or transfers;Two people to help with bathing/dressing/bathroom;Assistance with cooking/housework;Assist for transportation;Direct supervision/assist for medications management;Help with stairs or ramp for entrance   Equipment Recommendations  BSC/3in1;Wheelchair (measurements OT);Wheelchair cushion (measurements OT)    Recommendations for Other Services Rehab consult    Precautions / Restrictions Precautions Precautions: Fall       Mobility Bed Mobility Overal bed mobility: Needs Assistance Bed Mobility: Rolling, Supine to Sit, Sit to Supine Rolling: Mod assist   Supine to sit: +2 for physical assistance, Mod assist Sit to supine: +2 for physical assistance, Mod assist   General bed mobility comments: pt initiates the task of rolling but unable to control momentum. pt requires total +2 for supine to sit as pt nearly rolls himself off the eob. pt was using force of rolling to force L side of the body to follow for the transfer. pt with no LLE activation during the task. pt static sitting with R lateral lean and requires L UE on foot board to sustain static sitting. with increased time and cues able to static sit with L UE on knee and R UE on bed for balance. pt with R bias. pt returning to supine with no control of descend to pillow and total (A) for bil LE.    Transfers Overall transfer level: Needs assistance Equipment used:  (stedy) Transfers: Sit to/from Stand Sit to Stand: +2 physical assistance, Mod assist  General transfer comment: pt following steps to sit<>Stand from bed surface with counting to 3  in spanish. pt initially unable to sustain static standing. pt with increased time compensating with R lateral lean and all weight on R LE for >30 seconds. pt with uncontrolled descend to sitting. pt attempting to talk with R UE and immediate LOB to sitting and only after buttock touched bed surface reacting with bil UE to attempt to catch himself. pt sit<>stand >4 times during session     Balance Overall balance assessment: Needs assistance Sitting-balance support: Bilateral upper extremity supported, Feet supported Sitting balance-Leahy Scale: Poor   Postural control: Right lateral lean Standing balance support: Bilateral upper extremity supported, Reliant on assistive device for balance, During functional activity Standing balance-Leahy Scale: Zero                             ADL either performed or assessed with clinical judgement   ADL Overall ADL's : Needs assistance/impaired                     Lower Body Dressing: Total assistance Lower Body Dressing Details (indicate cue type and reason): don socks. pt reports "i can't reach there" pt giving reason for "belly surgery" so showing some awareness to his barriers               General ADL Comments: pt completed sit<>Stand in stedy for patient safety and LLE blocking    Extremity/Trunk Assessment Upper Extremity Assessment Upper Extremity Assessment: RUE deficits/detail;LUE deficits/detail RUE Deficits / Details: edema noted and preferred hand during sesssoin LUE Deficits / Details: pt with slower reaction time with this UE. pt able to reach outside base of support in all quadrants   Lower Extremity Assessment Lower Extremity Assessment: RLE deficits/detail RLE Deficits / Details: pt demonstrates decreased ability to complete internal rotation muscule recruitement and decreased cognition with knee extension. pt does demonstrate knee extension with mutliple attempts. pt is unable to adduct knees together. pt  with hip abducted doing knee flexion / extension. LLE Deficits / Details: no activation noted from hip down. Pt using body momentum to move L side toward EOB. pt putting all weight on R side in static standing with no quad firing noted despite multiple actions of therapist in session to involunatry fire muscle recruitment        Vision   Additional Comments: able to scan in all quadrants with LUE reaching task   Perception Perception Perception: Impaired   Praxis      Cognition Arousal/Alertness: Awake/alert Behavior During Therapy: WFL for tasks assessed/performed Overall Cognitive Status: Impaired/Different from baseline Area of Impairment: Orientation, Following commands, Attention, Safety/judgement, Awareness, Problem solving, Memory                 Orientation Level: Disoriented to, Place, Time, Situation Current Attention Level: Sustained Memory: Decreased recall of precautions, Decreased short-term memory Following Commands: Follows one step commands inconsistently Safety/Judgement: Decreased awareness of safety, Decreased awareness of deficits Awareness: Intellectual Problem Solving: Slow processing, Difficulty sequencing General Comments: Pt reports being in hospital in [facility] and that he came here because he drank 4 beers. pt aware of LB weakness but poorly describing deficits. pt spanish speaking only and needs questions redirected several times for translation. pt at one point answers a question in sounds and mumbles and no translation to be provided by interpreter. Pt agreeable and following 1 step commands. pt  unaware of body in space therefore movements appear to be impulsive when pt is trying to recruit all movement possible. pt nearly rolling himself off the bed surface attempt the command sit EOB on the Right. pt with very delayed reactions to lob.        Exercises Exercises: Other exercises Other Exercises Other Exercises: attempting LB activation  supine and standing with multiple task Other Exercises: pt demonstrates cognitive deficits and noted to have extended time down (20 minutes of CPR anoxic event)    Shoulder Instructions       General Comments VSS RA pain in LLE affecting some of patient progression.    Pertinent Vitals/ Pain       Pain Assessment Pain Assessment: Faces Faces Pain Scale: Hurts even more Pain Location: bil legs L> R pt reports L foot burns with static sitting Pain Descriptors / Indicators: Grimacing, Guarding Pain Intervention(s): Limited activity within patient's tolerance, Monitored during session, Repositioned  Home Living                                          Prior Functioning/Environment              Frequency  Min 2X/week        Progress Toward Goals  OT Goals(current goals can now be found in the care plan section)  Progress towards OT goals: Progressing toward goals  Acute Rehab OT Goals Patient Stated Goal: to be able to get up to the toilet when needed OT Goal Formulation: With patient Time For Goal Achievement: 06/12/21 Potential to Achieve Goals: Good ADL Goals Pt Will Perform Grooming: with set-up;with supervision;sitting Pt Will Perform Upper Body Bathing: with set-up;with supervision;sitting Pt Will Perform Lower Body Bathing: with min assist;sit to/from stand Pt Will Transfer to Toilet: with min assist;stand pivot transfer;bedside commode Pt Will Perform Toileting - Clothing Manipulation and hygiene:  (pt will complete sit<Stand min (A) for all these tasks) Additional ADL Goal #1: Pt will be Min A in and OOB for basic ADLs Additional ADL Goal #2: continue to assess vision Additional ADL Goal #3: Pt will follow 75% of one step commands on first try (met)  Plan Discharge plan remains appropriate    Co-evaluation    PT/OT/SLP Co-Evaluation/Treatment: Yes Reason for Co-Treatment: Complexity of the patient's impairments (multi-system  involvement);Necessary to address cognition/behavior during functional activity;For patient/therapist safety   OT goals addressed during session: ADL's and self-care;Proper use of Adaptive equipment and DME;Strengthening/ROM      AM-PAC OT "6 Clicks" Daily Activity     Outcome Measure   Help from another person eating meals?: A Lot Help from another person taking care of personal grooming?: A Lot Help from another person toileting, which includes using toliet, bedpan, or urinal?: A Lot Help from another person bathing (including washing, rinsing, drying)?: A Lot Help from another person to put on and taking off regular upper body clothing?: A Lot Help from another person to put on and taking off regular lower body clothing?: Total 6 Click Score: 11    End of Session Equipment Utilized During Treatment: Gait belt  OT Visit Diagnosis: Unsteadiness on feet (R26.81);Other abnormalities of gait and mobility (R26.89);Muscle weakness (generalized) (M62.81);Other symptoms and signs involving cognitive function;Pain Pain - part of body: Leg   Activity Tolerance Patient tolerated treatment well   Patient Left in bed;with call bell/phone within reach;with bed  alarm set;with nursing/sitter in room   Nurse Communication Mobility status;Precautions        Time: 1413-1450 OT Time Calculation (min): 37 min  Charges: OT General Charges $OT Visit: 1 Visit OT Treatments $Therapeutic Activity: 8-22 mins   Brynn, OTR/L  Acute Rehabilitation Services Pager: [phone] Office: [phone] .   Jeri Modena 06/03/2021, 3:15 PM

## 2021-06-03 NOTE — Progress Notes (Signed)
Nutrition Follow-up  DOCUMENTATION CODES:   Severe malnutrition in context of acute illness/injury  INTERVENTION:   - If PO intake does not improve with diet advancement, recommend placement of Cortrak NG tube and initiation of enteral nutrition as pt has been without adequate nutrition x 10 days and now with severe acute malnutrition  - Ensure Enlive po TID, each supplement provides 350 kcal and 20 grams of protein  - Vital Cuisine Shake TID with meals, each supplement provides 520 kcal and 22 grams of protein  - Renal MVI daily  - Encourage PO intake  NUTRITION DIAGNOSIS:   Severe Malnutrition related to acute illness (cardiac arrest, AKI, ileus) as evidenced by energy intake < or equal to 50% for > or equal to 5 days, percent weight loss (7.4% weight loss in less than 2 weeks).  Progressing, pt now on full liquid diet  GOAL:   Patient will meet greater than or equal to 90% of their needs  Progressing  MONITOR:   PO intake, Supplement acceptance, Diet advancement, Labs, Weight trends  REASON FOR ASSESSMENT:   Ventilator, Consult Enteral/tube feeding initiation and management  ASSESSMENT:   46 yo male admitted post OOH cardiac arrest, acute respiratory failure post arrest, possible aspiration-intubated, AKI requiring CRRT. No PMH on file. Pt is spanish speaking  01/15 - admitted, intubated, CRRT initiated 01/16 - TTM initiated 01/19 - extubated, CRRT held 01/20 - diet advanced to Regular 01/21 - NPO due to concern for bowel ischemia 01/22 - full liquid diet 01/23 - NPO 01/24 - s/p diagnostic laparoscopy revealing viable but thickened bowel without obstruction 01/25 - clear liquid diet  Pt continues on iHD. Last iHD was yesterday with 1300 ml net UF. Per Nephrology, plan is for iHD again tomorrow.  Spoke with pt at bedside utilizing iPad interpreting service. Pt somewhat confused at time of visit. Noted pt had consumed 100% of clear liquid breakfast meal tray.  Explained that pt's diet had been advanced to full liquids and that he would receive more options on meal trays. Pt states that he is starting "slowly" as to not upset his stomach. Pt reports that his abdominal pain is much improved compared to yesterday. Pt states that he feels hungry.  RD will monitor PO intake and assess for potential Cortrak need on Friday. Pt has now been without adequate nutrition x 10 days.  Admit weight: 93.5 kg Current weight: 86.6 kg  Pt with a total weight loss of 6.9 kg since admission. This is a 7.4% weight loss in less than 2 weeks which is severe and significant for timeframe. Based on weight loss and inadequate nutrition since admission, pt meets criteria for severe acute malnutrition.  Pt with mild pitting generalized edema and moderate pitting edema to BLE.  Meal Completion: 25-75% since diet advanced this AM  Medications reviewed and include: B-complex with vitamin C, aranesp weekly, Ensure Enlive BID, folic acid, IV protonix, IV abx  Labs reviewed: sodium 131, BUN 130, creatinine 8.78, phosphorus 11.6, magnesium 2.8, elevated LFTs, hemoglobin 8.0, WBC 23.3  I/O's: +3.4 L since admit  Diet Order:   Diet Order             Diet full liquid Room service appropriate? Yes; Fluid consistency: Thin  Diet effective now                   EDUCATION NEEDS:   Not appropriate for education at this time  Skin:  Skin Assessment: Skin Integrity Issues: Incisions: abdomen  Other: puncture to R knee  Last BM:  06/02/21  Height:   Ht Readings from Last 1 Encounters:  06/02/21 6' (1.829 m)    Weight:   Wt Readings from Last 1 Encounters:  06/03/21 86.6 kg    BMI:  Body mass index is 25.89 kg/m.  Estimated Nutritional Needs:   Kcal:  2100-2300 kcals  Protein:  120-140 grams  Fluid:  >/= 2 L    Mertie Clause, MS, RD, LDN Inpatient Clinical Dietitian Please see [facility] for contact information.

## 2021-06-03 NOTE — Progress Notes (Signed)
Inpatient Rehab Admissions Coordinator:   Pt. Is not yet medically ready for CIR at this time, as work up remains ongoing and Pt. Is not stable for transfer. I spoke with Pt.'s friend, Tamala Julian, regarding pt.'s dispo  over the phone with the assistance of Language Line interpreter.He states that he and his son live with pt. And can provide some help but they work during the day. He states that he needs a few days to call other friends and family to see if they could stay with the patient during the day while he works. I also left a message for Pt.'s friend Lianne Cure and have not yet received a response. I will continue to work towards identifying a safe discharge plan following CIR, as we are not able to accept patient unless we can can clarify that he will have 24/7 support at discharge.   Megan Salon, MS, CCC-SLP Rehab Admissions Coordinator  [phone] (celll) [phone] (office)

## 2021-06-03 NOTE — Progress Notes (Signed)
PROGRESS NOTE  Andres Braun [id number] DOB: 1975/09/23 DOA: 05/24/2021 PCP: Pcp, No  Brief History   46 year old male admitted to ICU 1/15 under PCCM care after cardiac arrest at home.  PEA arrest, s/p CPR x20 minutes and epi x5.  Patient reportedly out drinking with his friends the night prior to admission.  Found by friends with agonal breathing.  Intubated on arrival, needed vasopressors, noted to have renal failure, rhabdomyolysis with CK in the 14 K range, treated for aspiration pneumonia, nephrology consulted and started on CRRT.  Extubated 1/19 and off pressors.  Mental status slowly improved.  Transferred to telemetry.  Care transferred to [facility] on 1/24.  Nephrology following for HD needs.  General surgery on board for questionable small bowel ischemia and taken emergently to surgery 1/24. The bowel in question was found to be thickened, but viable.   The patient is tolerating a full liquid diet today. No new complaints.   Consultants  PCCM General surgery Nephrology Orthopedic surgery Neurology  Procedures  Exploratory laparotomy Mechanical ventilation CRRT Hemodialysis  Antibiotics   Anti-infectives (From admission, onward)    Start     Dose/Rate Route Frequency Ordered Stop   06/02/21 1045  piperacillin-tazobactam (ZOSYN) IVPB 2.25 g        2.25 g 100 mL/hr over 30 Minutes Intravenous Every 8 hours 06/02/21 0959     05/30/21 0800  cefTRIAXone (ROCEPHIN) 2 g in sodium chloride 0.9 % 100 mL IVPB  Status:  Discontinued        2 g 200 mL/hr over 30 Minutes Intravenous Every 24 hours 05/30/21 0747 06/02/21 0959   05/30/21 0800  metroNIDAZOLE (FLAGYL) IVPB 500 mg  Status:  Discontinued        500 mg 100 mL/hr over 60 Minutes Intravenous Every 12 hours 05/30/21 0747 06/02/21 0959   05/26/21 1000  cefTRIAXone (ROCEPHIN) 2 g in sodium chloride 0.9 % 100 mL IVPB        2 g 200 mL/hr over 30 Minutes Intravenous Every 24 hours 05/25/21 1618 05/28/21 1008   05/24/21 1130   cefTRIAXone (ROCEPHIN) 1 g in sodium chloride 0.9 % 100 mL IVPB  Status:  Discontinued        1 g 200 mL/hr over 30 Minutes Intravenous Every 24 hours 05/24/21 1119 05/25/21 1618      Subjective  The patient is resting comfortably. No new complaints.  Objective   Vitals:  Vitals:   06/03/21 0751 06/03/21 1328  BP: (!) 144/84 132/77  Pulse: 90 90  Resp: 17 18  Temp: 98.2 F (36.8 C) 98.5 F (36.9 C)  SpO2:      Exam:  Constitutional:  Appears calm and comfortable Eyes:  pupils and irises appear normal Normal lids and conjunctivae ENMT:  grossly normal hearing  Lips appear normal external ears, nose appear normal Oropharynx: mucosa, tongue,posterior pharynx appear normal Neck:  neck appears normal, no masses, normal ROM, supple no thyromegaly Respiratory:  CTA bilaterally, no w/r/r.  Respiratory effort normal. No retractions or accessory muscle use Cardiovascular:  RRR, no m/r/g No LE extremity edema   Normal pedal pulses Abdomen:  Abdomen mildly distended and tender. No hernias No HSM Normoactive bowel sounds. Musculoskeletal:  Digits/nails BUE: no clubbing, cyanosis, petechiae, infection exam of joints, bones, muscles of at least one of following: head/neck, RUE, LUE, RLE, LLE   strength and tone normal, no atrophy, no abnormal movements No tenderness, masses Normal ROM, no contractures  gait and station Skin:  No rashes,  lesions, ulcers palpation of skin: no induration or nodules Neurologic:  CN 2-12 intact Sensation all 4 extremities intact Psychiatric:  Mental status Mood, affect appropriate Orientation to person, place, time  judgment and insight appear intact     I have personally reviewed the following:   Today's Data  Vitals  Lab Data  BMP Albumin CK GFR CBC  Micro Data  MRSA by PCR not detected.  Imaging  CT abdomen and pelvis  Cardiology Data  EKG  Other Data    Scheduled Meds:  Chlorhexidine Gluconate Cloth  6  each Topical Q0600   Chlorhexidine Gluconate Cloth  6 each Topical Q0600   [START ON 06/04/2021] Chlorhexidine Gluconate Cloth  6 each Topical Q0600   darbepoetin (ARANESP) injection - NON-DIALYSIS  100 mcg Subcutaneous Q Mon-1800   feeding supplement  237 mL Oral TID BM   folic acid  1 mg Per Tube Daily   heparin injection (subcutaneous)  5,000 Units Subcutaneous Q8H   mouth rinse  15 mL Mouth Rinse BID   multivitamin  1 tablet Oral QHS   pantoprazole (PROTONIX) IV  40 mg Intravenous Q12H   Continuous Infusions:  sodium chloride Stopped (05/29/21 1602)   dexmedetomidine (PRECEDEX) IV infusion Stopped (05/31/21 1601)   piperacillin-tazobactam (ZOSYN)  IV 2.25 g (06/03/21 1453)    Principal Problem:   Cardiac arrest with pulseless electrical activity ([place]) Active Problems:   Acute respiratory failure with hypoxia and hypercapnia (HCC)   Septic shock due to undetermined organism ([place])   AKI (acute kidney injury) ([place])   Acute metabolic encephalopathy   Aspiration pneumonia of both lower lobes due to vomit ([place])   Protein-calorie malnutrition, severe   LOS: 10 days   A & P  Cardiac arrest/PEA arrest -Unclear etiology.?  In the setting of aspiration pneumonia and EtOH. -2D echo: LVEF 55-60%, no regional wall motion abnormalities.  Grade 1 diastolic dysfunction.   Acute hypoxemic respiratory failure Pneumomediastinum Nondisplaced fractures involving costochondral cartilage of anterior right fifth and sixth ribs Aspiration pneumonia Leukocytosis Intubated 1/15, extubated 1/19. -Currently on room air.  Acute respiratory failure resolved. -Continue pulmonary toileting -Follow chest x-ray 1/21: Interval decrease in pulmonary vascular congestion and pulmonary edema.  No pneumothorax.  Tip of right IJ central venous catheter in SVC close to the right atrium.  Will order chest x-ray for 1/25. -Completed ceftriaxone 1/15-1/19 -CT abdomen 1/23 showed patchy bilateral peripheral lower  lobe airspace densities, pneumonia versus pulmonary infarctions.  Although completed above ceftriaxone for pneumonia, still remains on it for abdominal indication. -CT abdomen 1/23 also redemonstrated pneumomediastinum and soft tissue air within the anterior chest.  No pneumothorax. - LEV dopplers negative for DVT   Acute metabolic encephalopathy -CT negative initially -Downtime in excess of 20 minutes -Minimize sedating medications -Although mental status has improved compared to admission, remains altered and encephalopathic.  May have an element of anoxic brain injury.   Ischemic enteritis versus colitis ileus -Appreciate surgery involvement -Continue antibiotics-currently on IV ceftriaxone and metronidazole -Lactate not elevated -Discussed in detail with [facility] team today, at bedside and subsequently via phone.  Patient with significant pain and tenderness in his LLQ, leukocytosis is worsened without alternate cause for same.  They are concerned about an ischemic bowel. -CCS has taken him to the OR for diagnostic laparoscopy and possible small bowel resection, possible colectomy and ostomy. -Do not believe patient clearly understands the severity and urgency of this, he does not wish for [place] to contact his ex-spouse or kids, his  brother reportedly is in [place] making it difficult to reach in a timely fashion.  Patient did provide me permission to call his friend [name] listed on his contacts.  I spoke with Mr. Jerilee Hoh who confirms that he is patient's friend and was willing to speak to the surgeon's regarding need for emergent surgery and consenting for same.  I relayed the same message to the surgeons. The surgery revealed only thickened bowel that was viable. No further intervention is required. The patient is on a full liquid diet and tolerating it well today.   Acute renal failure Severe metabolic acidosis Rhabdomyolysis -Nephrology following -Off CRRT as of 1/19 -last HD  1/22 with another HD planned 1/24. Has been placed. -Oliguric to anuric. -Avoid nephrotoxic agents -Maintain renal perfusion -CK is down to 1166 today. -CT abdomen 1/23: Muscular swelling, particularly of the iliopsoas and gluteal muscles consistent with rhabdomyolysis.  Some hemorrhage within the right iliopsoas muscle-not characterized as frank or significant hematoma at this time.   Lower extremity weakness -CT imaging with no concerning findings for nerve involvement versus compartment syndrome -CK trending down -Really unclear etiology.  Left lower extremity weaker than right. -As per neurology follow-up 1/23: MRI of the neuraxis was unremarkable for acute process.  MRI of the left femur pending. - As per MRI L spine a few days ago: Multiple fluid collections in the right ileo psoas muscle. The largest collection is 3.2 cm. There is surrounding enhancement and enlargement of the muscles. Findings most likely due to hematoma or abscess. No evidence of discitis osteomyelitis in the lumbar spine - Discussed in detail with Neuro hospitalist, no clear etiology based on extensive imaging.  Also discussed with Dr. Trenton Gammon, neurosurgery on 1/24, he reviewed images and did not think that there was a neurosurgical cause for his lower extremity weakness.  He did suggest getting IR to aspirate the above collection to rule out abscess.  Consulted IR.  He indicated that a psoas abscess or hematoma could cause lumbar plexus compression/irritation leading to some secondary weakness. IR reviewed images and suggested that the collection was likely a hematoma and would like predispose to infection if aspiration attempted and did not advise aspiration.   Anemia/Hemoccult positive stool In the setting of colitis -Twice daily PPI -Stable in the 8 g range.   Elevated liver enzymes -Likely secondary to shock liver, EtOH and rhabdomyolysis -Appears to be improving -Continue to trend.  Added LFTs to this morning  labs.   R anterior knee wound - Rt prior IO placement - Appreciate WOC recs   Physical deconditioning - PT/OT/SLP as appropriate - Limited at present in the setting of acute interventions and encephalopathy   Mechanical fall 1/24 and hit his head -As per nursing report, prior to going to the OR, patient attempted to get out of the bed, fell and hit his head without any obvious external injuries or LOC.  Patient's friend was in the room. -Advised nursing regarding strict fall precautions, placement of safety sitter if needed and monitor closely.   Body mass index is 26.52 kg/m.   Severe protein calorie malnutrition Nutrition Problem: Inadequate oral intake Etiology: acute illness Albumin 1.9.  Prealbumin pending.  Signs/Symptoms: NPO status Interventions: MVI, nutritional supplements.  I have seen and examined this patient myself. I have spent 35 minutes in his evaluation and care.  DVT Prophylaxis: Heparin CODE STATUS: Full Code Family Communication: None available Disposition: tbd  Kolbee Stallman, DO [facility] Direct contact: see [email or web address]  7PM-7AM contact night coverage as above 06/03/2021, 4:54 PM  LOS: 10 days

## 2021-06-03 NOTE — Plan of Care (Signed)
°  Problem: Clinical Measurements: Goal: Ability to maintain clinical measurements within normal limits will improve Outcome: Progressing   Problem: Elimination: Goal: Will not experience complications related to urinary retention Outcome: Progressing   Problem: Nutrition: Goal: Adequate nutrition will be maintained Outcome: Not Progressing   Problem: Safety: Goal: Ability to remain free from injury will improve Outcome: Not Progressing

## 2021-06-03 NOTE — Progress Notes (Signed)
Neurology Progress Note Spanish interpretor used for exam  HPI: Events since last NP note: MR's of brain, spine unremarkable for explanation of leg weakness. MR left femur is pending. He has had HD, last on 06/02/21. Had laparoscopy for questionable ischemic bowel vs. Ileus. No further planned surgery in surgery's note. Vascular US negative for DVT to BLEs.  Per neurosurgery:  46 year old male status post cardiac arrest now with unexplained left lower extremity weakness.  Work-up of his spine and brain reveal no evidence of compressive pathology.  Patient with left psoas fluid collection worrisome for abscess versus hematoma.  Certainly psoas abscess or hematoma could cause lumbar plexus compression/irritation and lead to some secondary weakness.  I think would be reasonable to have radiology aspirate the psoas fluid collection for diagnosis.  No role for neurosurgical intervention.        S: Says he hurts in extremities but not as bad as before. Most pain is at abdominal surgery incision site. He is aware of the muscle/enzyme issues associated with his injury and knows it will take time to heal. He has poor effort for exam.   O: Current vital signs: BP (!) 144/84 (BP Location: Right Arm)    Pulse 90    Temp 98.2 F (36.8 C) (Oral)    Resp 17    Ht 6' (1.829 m)    Wt 86.6 kg    SpO2 99%    BMI 25.89 kg/m  Vital signs in last 24 hours: Temp:  [97.7 F (36.5 C)-98.7 F (37.1 C)] 98.2 F (36.8 C) (01/25 0751) Pulse Rate:  [88-113] 90 (01/25 0751) Resp:  [14-31] 17 (01/25 0751) BP: (105-160)/(69-99) 144/84 (01/25 0751) SpO2:  [94 %-99 %] 99 % (01/25 0400) Weight:  [86.6 kg-89.5 kg] 86.6 kg (01/25 0400)  GENERAL: Well appearing male. Awake, alert in NAD. HEENT: Normocephalic and atraumatic. LUNGS: Normal respiratory effort.  Ext: warm.  NEURO:  Mental Status: Alert and oriented to self, age, state, place, and month. Disoriented to day or date.  Speech/Language: speech is without aphasia  or dysarthria.  Naming, repetition, fluency, and comprehension intact.  Cranial Nerves:  II: PERRL. Visual fields full.  III, IV, VI: EOMI. Eyelids elevate symmetrically.  V: Sensation is intact to light touch and symmetrical to face.  VII: Smile is symmetrical.  VIII: hearing intact to voice. IX, X: Palate elevates symmetrically. Phonation is normal.  [id number] shrug 5/5. XII: tongue is midline without fasciculations. Motor:  BUE: grip  5     bicep   5    tricep  5 [id number] to move foot and wiggle toes. Will not make an effort to move other than that.  LLE: No effort for exam. Does not move at all.  Tone: is normal and bulk is normal. Sensation- Intact to light touch bilaterally.    Coordination: FTN intact bilaterally.   DTRs:  RUE:  brachioradialis   1    biceps  1 RLE:  patella    0  LUE:  brachioradialis  1    biceps  1 LLE:  patella  0 Gait- deferred.  Medications  Current Facility-Administered Medications:    0.9 %  sodium chloride infusion, 250 mL, Intravenous, Continuous, Rolm Bookbinder, MD, Stopped at 05/29/21 1602   acetaminophen (TYLENOL) tablet 1,000 mg, 1,000 mg, Oral, Q6H PRN, Jillyn Ledger, PA-C   albuterol (PROVENTIL) (2.5 MG/3ML) 0.083% nebulizer solution 2.5 mg, 2.5 mg, Nebulization, Q2H PRN, Rolm Bookbinder, MD, 2.5 mg at 05/24/21 1143   B-complex  with vitamin C tablet 1 tablet, 1 tablet, Per Tube, Daily, Rolm Bookbinder, MD, 1 tablet at 05/31/21 0944   Chlorhexidine Gluconate Cloth 2 % PADS 6 each, 6 each, Topical, Q0600, Rolm Bookbinder, MD, 6 each at 06/03/21 0509   Chlorhexidine Gluconate Cloth 2 % PADS 6 each, 6 each, Topical, Q0600, Rolm Bookbinder, MD, 6 each at 06/03/21 0509   Darbepoetin Alfa (ARANESP) injection 100 mcg, 100 mcg, Subcutaneous, Q Mon-1800, Rolm Bookbinder, MD, 100 mcg at 06/01/21 2223   dexmedetomidine (PRECEDEX) 400 MCG/100ML (4 mcg/mL) infusion, 0.4-1.2 mcg/kg/hr, Intravenous, Titrated, Rolm Bookbinder, MD,  Stopped at 05/31/21 1601   feeding supplement (ENSURE ENLIVE / ENSURE PLUS) liquid 237 mL, 237 mL, Oral, BID BM, Rolm Bookbinder, MD, 237 mL at [id number]   folic acid (FOLVITE) tablet 1 mg, 1 mg, Per Tube, Daily, Rolm Bookbinder, MD, 1 mg at 06/01/21 1226   heparin injection 5,000 Units, 5,000 Units, Subcutaneous, Q8H, Rolm Bookbinder, MD, 5,000 Units at 06/03/21 [id number]   hydrALAZINE (APRESOLINE) injection 10 mg, 10 mg, Intravenous, Q6H PRN, Rolm Bookbinder, MD   MEDLINE mouth rinse, 15 mL, Mouth Rinse, BID, Rolm Bookbinder, MD, 15 mL at 06/01/21 2214   morphine 2 MG/ML injection 2 mg, 2 mg, Intravenous, Q2H PRN, Maczis, Barth Kirks, PA-C   oxyCODONE (Oxy IR/ROXICODONE) immediate release tablet 5-10 mg, 5-10 mg, Oral, Q4H PRN, Jillyn Ledger, PA-C, 10 mg at 06/02/21 2213   pantoprazole (PROTONIX) injection 40 mg, 40 mg, Intravenous, Q12H, Rolm Bookbinder, MD, 40 mg at 06/02/21 2212   piperacillin-tazobactam (ZOSYN) IVPB 2.25 g, 2.25 g, Intravenous, Q8H, Rolm Bookbinder, MD, Stopped at 06/03/21 0539  Pertinent Labs CK over 50000, down to 1166.   Imaging MD has reviewed images in epic and the results pertinent to this consultation are:  CT Abdomen and pelvis 06/01/21. Only recording muscular portion:  Muscular swelling, particularly of the ileo psoas and gluteal muscles consistent with the clinical history of rhabdomyolysis. Some hemorrhage noted within the right iliopsoas muscle, which I would not categorize as a frank or significant hematoma at this time.  CT Lspine 05/31/21:  Multiple fluid collections in the right ileo psoas muscle. The largest collection is 3.2 cm. There is surrounding enhancement and enlargement of the muscles. Findings most likely due to hematoma or abscess. The patient has had a right femoral vascular catheter.   MRI Femur, left:  Ordered.   Assessment: 46 yo male who presented 9 days ago after cardiac arrest at home. 20 mins CPR and 5 amps  of Epi before ROSC. Hospitalization complicated by rhabdomyolysis, encephalopathy, aspiration PNA, ileus/ischemic bowel, acidosis, renal failure, hypotension, and liver shock. Neurology was consulted on 05/30/21 for finding of left LE weakness. His MRI brain showed no etiology for his encephalopathy which has cleared some since NPs last exam.  At this point, unless MR femur is positive for issue, would attribute his c/o LE weakness to rhabdomyolysis. Although his WBCC is up, this could be reactive to his acute illness, intraabdominal infection,  and surgery. He is on Zosyn. Low suspicion for brain or spine infection. Very poor effort on exam. Not distracted like he was on NPs last exam.    Recommendations/Plan:  -We will f/up left femur MR.  -Continue to treat metabolic derangements as you are doing.  -Doubt CNS infection.  -Neurology will be available for questions.   Pt seen by Clance Boll, MSN, APN-BC/Nurse Practitioner/Neuro and later by MD. Note and plan to be edited as needed by MD.  Pager:  [phone]   Attending addendum MRI left femur completed. See radiology detailed report. Likely weakness due to rhabdomyolysis versus uremic myopathy.   Very less likely that a polymyositis or myositis would involve just the lower extremity muscles and spare the others and happen in the context of another clear cause (metabolic).  MRI of the neuraxis remains unremarkable.  Local abscess/hematoma can be evaluated with aspiration as recommended by neurosurgery.  There is concern for compartment syndrome in the correct medical settings but I was told that orthopedics had already been evaluated and do not think that he has compartment syndrome. No further inpatient recommendations by neurology at this time. Please call with questions.   -- Amie Portland, MD Neurologist [facility] Pager: [phone]

## 2021-06-03 NOTE — TOC Initial Note (Signed)
Transition of Care [facility]) - Initial/Assessment Note    Patient Details  Name: Andres Braun MRN: [id number] Date of Birth: [birth year]  [facility] CM/SW Contact:    Angelita Ingles, RN Phone Number:[phone]  06/03/2021, 9:01 AM  Clinical Narrative:                 Disposition remains unclear. CIR continues to follow but currently does not have support plan post CIR if accepted. TOC will follow for any disposition needs.         Patient Goals and CMS Choice        Expected Discharge Plan and Services                                                Prior Living Arrangements/Services                       Activities of Daily Living Home Assistive Devices/Equipment: None ADL Screening (condition at time of admission) Patient's cognitive ability adequate to safely complete daily activities?: Yes Is the patient deaf or have difficulty hearing?: No Does the patient have difficulty seeing, even when wearing glasses/contacts?: No Does the patient have difficulty concentrating, remembering, or making decisions?: No Patient able to express need for assistance with ADLs?: Yes Does the patient have difficulty dressing or bathing?: No Independently performs ADLs?: Yes (appropriate for developmental age) Does the patient have difficulty walking or climbing stairs?: No Weakness of Legs: None Weakness of Arms/Hands: None  Permission Sought/Granted                  Emotional Assessment              Admission diagnosis:  Cardiac arrest ([place]) [I46.9] Shock ([place]) [R57.9] Hypothermia, initial encounter [T68.XXXA] Acute respiratory failure with hypoxia and hypercapnia (HCC) [J96.01, J96.02] Patient Active Problem List   Diagnosis Date Noted   Aspiration pneumonia of both lower lobes due to vomit ([place]) 05/27/2021   Acute respiratory failure with hypoxia and hypercapnia (HCC)    Septic shock due to undetermined organism ([place])    AKI  (acute kidney injury) ([place])    Acute metabolic encephalopathy    Cardiac arrest with pulseless electrical activity ([place]) 05/24/2021   PCP:  Pcp, No Pharmacy:  No Pharmacies Listed    Social Determinants of Health (SDOH) Interventions    Readmission Risk Interventions No flowsheet data found.

## 2021-06-03 NOTE — PMR Pre-admission (Shared)
PMR Admission Coordinator Pre-Admission Assessment  Patient: Andres Braun is an 46 y.o., male MRN: [id number] DOB: Mar 22, 1976 Height: 6' (182.9 cm) Weight: 86.6 kg  Insurance Information HMO:     PPO:      PCP:      IPA:      80/20:      OTHER:  PRIMARY: Uninsured      Policy#:       Subscriber:   SECONDARY:       Policy#:      Phone#:   Financial Counselor: ***      Phone#:   The [employer] for patients in Inpatient Rehabilitation Facilities with attached Privacy Act [place] Records was provided and verbally reviewed with: Patient  Emergency Contact Information Contact Information     Name Relation Home Work Mobile   [place] Spouse   [phone]   Edilia Bo Other   [phone]   Jethro Poling   [phone]       Current Medical History  Patient Admitting Diagnosis: Cardiac Arrest  History of Present Illness: 46 year old male admitted to ICU 1/15  after cardiac arrest at home.  PEA arrest, s/p CPR x20 minutes and epi x5.  Patient reportedly out drinking with his friends the night prior to admission.  Found by friends with agonal breathing.  Intubated on arrival, needed vasopressors, noted to have renal failure, rhabdomyolysis with CK in the 14 K range, treated for aspiration pneumonia, nephrology consulted and started on CRRT.  Extubated 1/19 and off pressors.  Mental status slowly improved.  Transferred to telemetry.  Care transferred to [facility] on 1/24.  Nephrology following for HD needs.  General surgery on board for questionable small bowel ischemia and taken emergently to surgery 1/24 for diagnostic laparoscopy which showed that the area that appeared abnormal on CT and concerning for ischemia was noted to be thickened bowel intra-op but viable. The cecum, appendix and remainder of the right colon was viable as well. Pt. Was advanced to clear liquids  and no further surgery was recommended. PT/OT consulted and  recommended CIR to assist return to PLOF.   Complete NIHSS TOTAL: 0  Patient's medical record from [facility] has been reviewed by the rehabilitation admission coordinator and physician.  Past Medical History  History reviewed. No pertinent past medical history.  Has the patient had major surgery during 100 days prior to admission? Yes  Family History   family history is not on file.  Current Medications  Current Facility-Administered Medications:    0.9 %  sodium chloride infusion, 250 mL, Intravenous, Continuous, Rolm Bookbinder, MD, Stopped at 05/29/21 1602   acetaminophen (TYLENOL) tablet 1,000 mg, 1,000 mg, Oral, Q6H PRN, Jillyn Ledger, PA-C   albuterol (PROVENTIL) (2.5 MG/3ML) 0.083% nebulizer solution 2.5 mg, 2.5 mg, Nebulization, Q2H PRN, Rolm Bookbinder, MD, 2.5 mg at 05/24/21 1143   B-complex with vitamin C tablet 1 tablet, 1 tablet, Per Tube, Daily, Rolm Bookbinder, MD, 1 tablet at 05/31/21 0944   Chlorhexidine Gluconate Cloth 2 % PADS 6 each, 6 each, Topical, Q0600, Rolm Bookbinder, MD, 6 each at 06/03/21 0509   Chlorhexidine Gluconate Cloth 2 % PADS 6 each, 6 each, Topical, Q0600, Rolm Bookbinder, MD, 6 each at 06/03/21 0509   Darbepoetin Alfa (ARANESP) injection 100 mcg, 100 mcg, Subcutaneous, Q Mon-1800, Rolm Bookbinder, MD, 100 mcg at 06/01/21 2223   dexmedetomidine (PRECEDEX) 400 MCG/100ML (4 mcg/mL) infusion, 0.4-1.2 mcg/kg/hr, Intravenous, Titrated, Rolm Bookbinder, MD, Stopped at 05/31/21 1601  feeding supplement (ENSURE ENLIVE / ENSURE PLUS) liquid 237 mL, 237 mL, Oral, BID BM, Rolm Bookbinder, MD, 237 mL at 09/98/33 8250   folic acid (FOLVITE) tablet 1 mg, 1 mg, Per Tube, Daily, Rolm Bookbinder, MD, 1 mg at 06/01/21 1226   heparin injection 5,000 Units, 5,000 Units, Subcutaneous, Q8H, Rolm Bookbinder, MD, 5,000 Units at 06/03/21 5397   hydrALAZINE (APRESOLINE) injection 10 mg, 10 mg, Intravenous, Q6H PRN,  Rolm Bookbinder, MD   MEDLINE mouth rinse, 15 mL, Mouth Rinse, BID, Rolm Bookbinder, MD, 15 mL at 06/01/21 2214   morphine 2 MG/ML injection 2 mg, 2 mg, Intravenous, Q2H PRN, Maczis, Barth Kirks, PA-C   oxyCODONE (Oxy IR/ROXICODONE) immediate release tablet 5-10 mg, 5-10 mg, Oral, Q4H PRN, Jillyn Ledger, PA-C, 10 mg at 06/02/21 2213   pantoprazole (PROTONIX) injection 40 mg, 40 mg, Intravenous, Q12H, Rolm Bookbinder, MD, 40 mg at 06/02/21 2212   piperacillin-tazobactam (ZOSYN) IVPB 2.25 g, 2.25 g, Intravenous, Q8H, Rolm Bookbinder, MD, Stopped at 06/03/21 6734  Patients Current Diet:  Diet Order             Diet clear liquid Room service appropriate? Yes; Fluid consistency: Thin  Diet effective now                   Precautions / Restrictions Precautions Precautions: Fall Restrictions Weight Bearing Restrictions: No   Has the patient had 2 or more falls or a fall with injury in the past year? No  Prior Activity Level Community (5-7x/wk): Pt. was active in the community, working PTA  Prior Functional Level Self Care: Did the patient need help bathing, dressing, using the toilet or eating? Independent  Indoor Mobility: Did the patient need assistance with walking from room to room (with or without device)? Independent  Stairs: Did the patient need assistance with internal or external stairs (with or without device)? Independent  Functional Cognition: Did the patient need help planning regular tasks such as shopping or remembering to take medications? Independent  Patient Information Are you of Hispanic, Latino/a,or Spanish origin?: E. Yes, another Hispanic, Latino, or Spanish origin What is your race?: Z. None of the above Do you need or want an interpreter to communicate with a doctor or health care staff?: 1. Yes  Patient's Response To:  Health Literacy and Transportation Is the patient able to respond to health literacy and transportation needs?:  Yes Health Literacy - How often do you need to have someone help you when you read instructions, pamphlets, or other written material from your doctor or pharmacy?: Always In the past 12 months, has lack of transportation kept you from medical appointments or from getting medications?: No In the past 12 months, has lack of transportation kept you from meetings, work, or from getting things needed for daily living?: No  Development worker, international aid / [place] Devices/Equipment: None  Prior Device Use: Indicate devices/aids used by the patient prior to current illness, exacerbation or injury? None of the above  Current Functional Level Cognition  Overall Cognitive Status: Impaired/Different from baseline Current Attention Level: Sustained Orientation Level: Oriented X4 Following Commands: Follows one step commands inconsistently, Follows one step commands with increased time Safety/Judgement: Decreased awareness of safety, Decreased awareness of deficits General Comments: perservating on wanting water/eat    Extremity Assessment (includes Sensation/Coordination)  Upper Extremity Assessment: Defer to OT evaluation RUE Deficits / Details: edematous, slow to move, decreased coordination, AROM diminshed throughout--at times you ask him to move it and he  moves more normally and other times he sayd "I can't" RUE Coordination: decreased fine motor, decreased gross motor LUE Deficits / Details: Still moving more normlly compared to RUE, but not full AROM  Lower Extremity Assessment: LLE deficits/detail, Generalized weakness LLE Deficits / Details: Unable to detect in active movement or muscle contraction on knee and ankle. Some active hip movement noted but <2+/5 LLE Sensation: decreased light touch    ADLs  Overall ADL's : Needs assistance/impaired Eating/Feeding: NPO Grooming: Maximal assistance, Sitting Grooming Details (indicate cue type and reason): EOB Upper Body Bathing:  Maximal assistance, Sitting Upper Body Bathing Details (indicate cue type and reason): EOB Lower Body Bathing: Total assistance Lower Body Bathing Details (indicate cue type and reason): Max A +2 sit<>stand Upper Body Dressing : Total assistance, Sitting Upper Body Dressing Details (indicate cue type and reason): EOB Lower Body Dressing: Total assistance Lower Body Dressing Details (indicate cue type and reason): Max A +2 sit<>stand    Mobility  Overal bed mobility: Needs Assistance Bed Mobility: Supine to Sit, Sit to Supine Supine to sit: Total assist, +2 for physical assistance, HOB elevated Sit to supine: Total assist, +2 for physical assistance General bed mobility comments: Assist for almost all aspects coming to sitting (did attempt to move his RLE some and reach for rail with LUE). Assist to bring both legs up into bed and to control descent of trunk    Transfers  Overall transfer level: Needs assistance Equipment used: 2 person hand held assist Transfers: Sit to/from Stand Sit to Stand: Max assist, +2 physical assistance General transfer comment: deferred due to increased swelling and weakness in BLE at this time.    Ambulation / Gait / Stairs / Office manager / Balance Dynamic Sitting Balance Sitting balance - Comments: pt unable to maintain with either single UE or BUE support alone, max-totalA to maintain static sitting EOB Balance Overall balance assessment: Needs assistance Sitting-balance support: Single extremity supported, Feet supported Sitting balance-Leahy Scale: Poor Sitting balance - Comments: pt unable to maintain with either single UE or BUE support alone, max-totalA to maintain static sitting EOB Postural control: Posterior lean, Right lateral lean Standing balance support: Bilateral upper extremity supported Standing balance-Leahy Scale: Zero Standing balance comment: +2 mod to max assist to maintain standing with lt knee blocked     Special needs/care consideration Dialysis: Hemodialysis {:22385} and Skin ***   Previous Home Environment (from acute therapy documentation) Living Arrangements: Non-relatives/Friends  Lives With: Friend(s) Available Help at Discharge: Friend(s), Available PRN/intermittently Type of Home: House Home Layout: One level Home Access: Stairs to enter Technical brewer of Steps: 3 Bathroom Shower/Tub: Chiropodist: Standard Bathroom Accessibility: Yes How Accessible: Accessible via walker Home Care Services: No  Discharge Living Setting Plans for Discharge Living Setting: Patient's home, Lives with (comment) Type of Home at Discharge: House Discharge Home Layout: One level Discharge Home Access: Stairs to enter Entrance Stairs-Rails: Right Entrance Stairs-Number of Steps: 3 Discharge Bathroom Shower/Tub: Tub/shower unit Discharge Bathroom Toilet: Standard Discharge Bathroom Accessibility: Yes How Accessible: Accessible via walker Does the patient have any problems obtaining your medications?: Yes (Describe)  Social/Family/Support Systems Patient Roles: Other (Comment) Contact Information: [phone] Anticipated Caregiver: [name] (friend, roommate) Anticipated Caregiver's Contact Information: [phone] Ability/Limitations of Caregiver: Min-mod A Caregiver Availability: 24/7 Discharge Plan Discussed with Primary Caregiver: Yes Is Caregiver In Agreement with Plan?: Yes Does Caregiver/Family have Issues with Lodging/Transportation while Pt is in Rehab?: No  Goals  Patient/Family Goal for Rehab: PT/OT mod A Expected length of stay: 16-18 days Pt/Family Agrees to Admission and willing to participate: Yes Program Orientation Provided & Reviewed with Pt/Caregiver Including Roles  & Responsibilities: Yes  Decrease burden of Care through IP rehab admission: Specialzed equipment needs, Decrease number of caregivers, Bowel and bladder program, and  Patient/family education  Possible need for SNF placement upon discharge: not anticipated  Patient Condition: I have reviewed medical records from [facility], spoken with CM, and patient. I met with patient at the bedside for inpatient rehabilitation assessment.  Patient will benefit from ongoing PT and OT, can actively participate in 3 hours of therapy a day 5 days of the week, and can make measurable gains during the admission.  Patient will also benefit from the coordinated team approach during an Inpatient Acute Rehabilitation admission.  The patient will receive intensive therapy as well as Rehabilitation physician, nursing, social worker, and care management interventions.  Due to bladder management, bowel management, safety, skin/wound care, disease management, medication administration, pain management, and patient education the patient requires 24 hour a day rehabilitation nursing.  The patient is currently *** with mobility and basic ADLs.  Discharge setting and therapy post discharge at home with home health is anticipated.  Patient has agreed to participate in the Acute Inpatient Rehabilitation Program and will admit {Time; today/tomorrow:10263}.  Preadmission Screen Completed By:  Genella Mech, 06/03/2021 8:47 AM ______________________________________________________________________   Discussed status with Dr. Marland Kitchen on *** at *** and received approval for admission today.  Admission Coordinator:  Genella Mech, CCC-SLP, time Marland KitchenSudie Grumbling ***   Assessment/Plan: Diagnosis: Does the need for close, 24 hr/day Medical supervision in concert with the patient's rehab needs make it unreasonable for this patient to be served in a less intensive setting? {yes_no_potentially:3041433} Co-Morbidities requiring supervision/potential complications: *** Due to {due LU:7276184}, does the patient require 24 hr/day rehab nursing? {yes_no_potentially:3041433} Does the patient require  coordinated care of a physician, rehab nurse, PT, OT, and SLP to address physical and functional deficits in the context of the above medical diagnosis(es)? {yes_no_potentially:3041433} Addressing deficits in the following areas: {deficits:3041436} Can the patient actively participate in an intensive therapy program of at least 3 hrs of therapy 5 days a week? {yes_no_potentially:3041433} The potential for patient to make measurable gains while on inpatient rehab is {potential:3041437} Anticipated functional outcomes upon discharge from inpatient rehab: {functional outcomes:304600100} PT, {functional outcomes:304600100} OT, {functional outcomes:304600100} SLP Estimated rehab length of stay to reach the above functional goals is: *** Anticipated discharge destination: {anticipated dc setting:21604} 10. Overall Rehab/Functional Prognosis: {potential:3041437}   MD Signature: ***

## 2021-06-03 NOTE — Progress Notes (Signed)
NAME:  Andres Braun, MRN:  [id number], DOB:  09-Nov-1975, LOS: 15 ADMISSION DATE:  05/24/2021, CONSULTATION DATE:  06/03/21 REFERRING MD:  EDP, CHIEF COMPLAINT:  Cardiac arrest   History of Present Illness:  46 year old man admitted after cardiac arrest at home. PEA arrest, s/p CPR x 20 minutes and Epi x 5. Patient reportedly out drinking with his friends the night prior to admission.  Found by friends with agonal breathing.  Lost pulse. Improved neurologic status, extubated 1/19.   Pertinent Medical History:  Unknown  Significant Hospital Events: Including procedures, antibiotic start and stop dates in addition to other pertinent events   1/15:  20 minutes of CPR, 5 rounds of epinephrine.  ROSC was obtained.  Rhythm PEA per report.  Emesis noted all around him. On arrival to ED, severely acidemic pH 6.7.  Refractory acidemia.  Intubated. Difficult to ventilate.  Eventually requiring dose of paralytic after adequate sedation.  Labs notable for renal failure, elevated LFTs.  CK in the 14,000 range. Started on ceftriaxone for aspiration PNA, Nephro consulted. Started on CRRT.  CVL and HD cath placed.  1/16: hypotensive overnight. Having Anoxic myoclonus during evening hrs. Treated w/ Versed. Having difficulty w/ HD cath clotting due to Curved ports affecting flow.  EEG suggested diffuse cerebral dysfunction.  There is no epileptiform activity or seizures.  Right IJ dialysis catheter removed due to malfunction, right femoral dialysis catheter placed.  Social work working on family contacts.  Temperature management initiated to avoid fever 1/17: Hemodynamics a little better.  Sedation minimized.  Exam a little improved compared to 1/16 spontaneously moving left upper extremity to stimulus, more forceful cough, opens eyes.  Lower extremity pulses no longer palpable or audible via Doppler. IV heparin stared. Dopplers ordered  1/18: Hemodynamics continue to improve, decreasing pressor requirements.  Improving neuro exam, will weakly follow commands when asked in Spanish. Readily opens eyes to voice. BLE pedal pulses now +doppler. Cortrak placed. 1/19: Improved residuals s/p Cortrak, though emesis on placement. Emesis overnight. Minimal secretions. Mild agitation/gagging, waking up/intermittently following commands. Off of pressors. Extubated to [place]. 1/20: Awake, stable respiratory status on 4L [place]. Remains encephalopathic, oriented only to self/general location. Hypertensive, off of pressors. CRRT stopped, plan for HD. Transferred to telemetry Pulmonary critical care remain on the case due to possible trip to the OR 1/24 tolerated laparoscopic surgery well  Interim History / Subjective:   Some pain at surgical site Leg feels better Complaining of cough  Objective:  Blood pressure (!) 144/84, pulse 90, temperature 98.2 F (36.8 C), temperature source Oral, resp. rate 17, height 6' (1.829 m), weight 86.6 kg, SpO2 99 %.        Intake/Output Summary (Last 24 hours) at 06/03/2021 1115 Last data filed at 06/03/2021 [id number] Gross per 24 hour  Intake 1280 ml  Output 1303 ml  Net -23 ml    Filed Weights   06/02/21 1302 06/02/21 1648 06/03/21 0400  Weight: 88.7 kg 89.5 kg 86.6 kg   Physical Examination: General: Awake and alert no acute distress at rest HEENT: Moist oral mucosa Neuro: Moving all extremities CV: S1-S2 appreciated PULM: Decreased air entry bilaterally GI: Slightly distended tender Extremities: warm/dry,  edema right knee with wound Skin: no rashes or lesions   Resolved Hospital Problem List:   Cold lower extremities with no palpable pulses Suspect at least to some extent due to shock state. Improved 1/18 with bilateral DP/PT pulses present with doppler. Mixed metabolic and respiratory acidosis Circulatory shock  Assessment & Plan:  Postcardiac arrest -We will continue to monitor closely -Has been hemodynamically stable  Acute hypoxemic respiratory  failure -Currently on room air Aspiration pneumonia -Was on ceftriaxone -Metronidazole -Currently Zosyn -Antibiotics been continued because of concern about colitis  Metabolic encephalopathy -This continues to improve  Acute kidney injury -Dialysis per nephrology  Lower extremity weakness -This appears to be improving  On PPI  Monitor elevated liver enzymes Monitor  R anterior knee wound Continue to monitor  Physical deconditioning PT and OT as tolerated    Best Practice (right click and "Reselect all SmartList Selections" daily)   Diet/type: Diet as tolerated DVT prophylaxis: prophylactic heparin  GI prophylaxis: PPI Lines: Dialysis Catheter Foley:  N/A Code Status:  full code   Sherrilyn Rist, MD [facility] Pager: See Shea Evans

## 2021-06-03 NOTE — Progress Notes (Signed)
1 Day Post-Op  Subjective: CC: Interpreter used. Patient reports he has some burning pain at incision sites as well as some "hunger pain" but otherwise his abdominal pain has resolved. No n/v. Passing flatus. He has had a bm since surgery. No further bloody bm's. S/p HD yesterday where 1.3L were removed.   Objective: Vital signs in last 24 hours: Temp:  [97.7 F (36.5 C)-98.7 F (37.1 C)] 98.2 F (36.8 C) (01/25 0400) Pulse Rate:  [88-113] 100 (01/24 2035) Resp:  [14-31] 17 (01/25 0400) BP: (105-160)/(69-99) 131/73 (01/25 0400) SpO2:  [94 %-99 %] 99 % (01/25 0400) Weight:  [86.6 kg-89.5 kg] 86.6 kg (01/25 0400) Last BM Date: 06/02/21  Intake/Output from previous day: 01/24 0701 - 01/25 0700 In: 800 [I.V.:350; IV Piggyback:450] Out: 1303 [Blood:3] Intake/Output this shift: No intake/output data recorded.  PE: Gen:  Alert, NAD, pleasant Pulm:  Normal rate and effort  Abd: Much less distended today and very soft. Minimally tender around laparoscopic incisions sites that appears appropriate - otherwise no abdominal ttp. No peritonitis. +bs. Laparoscopic incisions with steri-strips in place, c/d/I.   Lab Results:  Recent Labs    06/02/21 1610 06/03/21 0408  WBC 28.0* 23.3*  HGB 8.7* 8.0*  HCT 26.2* 24.4*  PLT 467* 457*   BMET Recent Labs    06/02/21 1430 06/03/21 0408  NA 132* 131*  K 5.0 4.6  CL 96* 96*  CO2 12* 16*  GLUCOSE 111* 103*  BUN 191* 130*  CREATININE 11.37* 8.78*  CALCIUM 8.5* 7.7*   PT/INR No results for input(s): LABPROT, INR in the last 72 hours. CMP     Component Value Date/Time   NA 131 (L) 06/03/2021 0408   K 4.6 06/03/2021 0408   CL 96 (L) 06/03/2021 0408   CO2 16 (L) 06/03/2021 0408   GLUCOSE 103 (H) 06/03/2021 0408   BUN 130 (H) 06/03/2021 0408   CREATININE 8.78 (H) 06/03/2021 0408   CALCIUM 7.7 (L) 06/03/2021 0408   PROT 5.0 (L) 06/02/2021 1610   ALBUMIN 1.9 (L) 06/03/2021 0408   AST 77 (H) 06/02/2021 1610   ALT 173 (H)  06/02/2021 1610   ALKPHOS 158 (H) 06/02/2021 1610   BILITOT 0.6 06/02/2021 1610   GFRNONAA 7 (L) 06/03/2021 0408   Lipase  No results found for: LIPASE  Studies/Results: CT ABDOMEN PELVIS WO CONTRAST  Result Date: 06/01/2021 CLINICAL DATA:  Abdominal pain, acute, nonlocalized. Cardiac arrest 8 days ago. Renal failure secondary to rhabdomyolysis. EXAM: CT ABDOMEN AND PELVIS WITHOUT CONTRAST TECHNIQUE: Multidetector CT imaging of the abdomen and pelvis was performed following the standard protocol without IV contrast. RADIATION DOSE REDUCTION: This exam was performed according to the departmental dose-optimization program which includes automated exposure control, adjustment of the mA and/or kV according to patient size and/or use of iterative reconstruction technique. COMPARISON:  CT 2 days ago. FINDINGS: Lower chest: Small right effusion layering dependently. Patchy peripheral areas of parenchymal density in both lower lobes appear similar to the previous study and could represent infectious pneumonia or pulmonary infarctions. Calcified subcarinal lymph nodes as seen previously. Pneumomediastinum and chest wall air as seen previously, possibly subsequent to previous resuscitation. No progressive or worsening finding. Hepatobiliary: Liver parenchyma appears normal without contrast. No calcified gallstones. Pancreas: Normal Spleen: Normal Adrenals/Urinary Tract: Adrenal glands are normal. The kidneys appear enlarged consistent with acute nephritis. No evidence of obstruction or focal lesion. Bladder appears unremarkable, nearly empty. Small amount of air in the bladder as seen previously. Stomach/Bowel:  Stomach appears normal. Question wall thickening in a segmental region of the ileum, possibly ischemic insult with this clinical history. No evidence of frank necrosis. No dilated small bowel. Appendix is normal. There may be mild edema of the right colon as well. Mild diverticulosis of the left colon.  Vascular/Lymphatic: Aorta and IVC are normal. No adenopathy. Mild nonspecific retroperitoneal edema, often seen in critically ill patients lying supine. Reproductive: Normal Other: Tiny amount of free intraperitoneal fluid. Apparent muscular swelling of the iliopsoas and gluteal muscles that could go along with the clinical history of rhabdomyolysis. There may be some subtle hyperdensity within the right ileo psoas musculature consistent with some intramuscular hemorrhage. No large hematoma. IMPRESSION: Redemonstration of patchy bilateral peripheral lower lobe airspace density which could be due to pneumonia or pulmonary infarctions. No progressive change. Redemonstration of pneumomediastinum and soft tissue air within the anterior chest, presumably subsequent to resuscitation. No visible pneumothorax in the region studied. Redemonstration of bowel wall edema the distal ileum and possibly the proximal ascending colon, possibly ischemic in this setting. No progressive change or evidence of frank bowel necrosis. Enlarged kidneys consistent with the history of acute renal failure. No sign of obstruction. Muscular swelling, particularly of the ileo psoas and gluteal muscles consistent with the clinical history of rhabdomyolysis. Some hemorrhage noted within the right iliopsoas muscle, which I would not categorize as a frank or significant hematoma at this time. Electronically Signed   By: Paulina Fusi M.D.   On: 06/01/2021 11:33   DG CHEST PORT 1 VIEW  Result Date: 06/01/2021 CLINICAL DATA:  Placement of central venous catheter EXAM: PORTABLE CHEST 1 VIEW COMPARISON:  05/30/2021 FINDINGS: Transverse diameter of heart is increased. There is interval decrease in pulmonary vascular congestion and pulmonary edema. There is poor inspiration. There are no new focal infiltrates. There is interval placement of large caliber right IJ central venous catheter with its tip in the superior vena cava close to the right atrium.  There is no significant pleural effusion or pneumothorax. IMPRESSION: There is interval decrease in pulmonary vascular congestion and pulmonary edema. There is no pneumothorax. Tip of right IJ central venous catheter is seen in the superior vena cava close to the right atrium. Electronically Signed   By: Ernie Avena M.D.   On: 06/01/2021 16:22   VAS [place] LOWER EXTREMITY VENOUS (DVT)  Result Date: 06/02/2021  Lower Venous DVT Study Patient Name:  Andres Braun [facility]  Date of Exam:   06/02/2021 Medical Rec #: [id number]          Accession #:    [id number] Date of Birth: 1975/10/06           Patient Gender: M Patient Age:   81 years Exam Location:  [facility] Procedure:      VAS [place] LOWER EXTREMITY VENOUS (DVT) Referring Phys: ANAND HONGALGI --------------------------------------------------------------------------------  Indications: Leukocytosis unclear etiol.  Comparison Study: no prior Performing Technologist: [place] Ponder RVS  Examination Guidelines: A complete evaluation includes B-mode imaging, spectral Doppler, color Doppler, and power Doppler as needed of all accessible portions of each vessel. Bilateral testing is considered an integral part of a complete examination. Limited examinations for reoccurring indications may be performed as noted. The reflux portion of the exam is performed with the patient in reverse Trendelenburg.  +---------+---------------+---------+-----------+----------+--------------+  RIGHT     Compressibility Phasicity Spontaneity Properties Thrombus Aging  +---------+---------------+---------+-----------+----------+--------------+  CFV       Full  Yes       Yes                                    +---------+---------------+---------+-----------+----------+--------------+  SFJ       Full                                                             +---------+---------------+---------+-----------+----------+--------------+  FV Prox   Full                                                              +---------+---------------+---------+-----------+----------+--------------+  FV Mid    Full                                                             +---------+---------------+---------+-----------+----------+--------------+  FV Distal Full                                                             +---------+---------------+---------+-----------+----------+--------------+  PFV       Full                                                             +---------+---------------+---------+-----------+----------+--------------+  POP       Full            Yes       Yes                                    +---------+---------------+---------+-----------+----------+--------------+  PTV       Full                                                             +---------+---------------+---------+-----------+----------+--------------+  PERO      Full                                                             +---------+---------------+---------+-----------+----------+--------------+   +---------+---------------+---------+-----------+----------+--------------+  LEFT      Compressibility Phasicity  Spontaneity Properties Thrombus Aging  +---------+---------------+---------+-----------+----------+--------------+  CFV       Full            Yes       Yes                                    +---------+---------------+---------+-----------+----------+--------------+  SFJ       Full                                                             +---------+---------------+---------+-----------+----------+--------------+  FV Prox   Full                                                             +---------+---------------+---------+-----------+----------+--------------+  FV Mid    Full                                                             +---------+---------------+---------+-----------+----------+--------------+  FV Distal Full                                                              +---------+---------------+---------+-----------+----------+--------------+  PFV       Full                                                             +---------+---------------+---------+-----------+----------+--------------+  POP       Full            Yes       Yes                                    +---------+---------------+---------+-----------+----------+--------------+  PTV       Full                                                             +---------+---------------+---------+-----------+----------+--------------+  PERO      Full                                                             +---------+---------------+---------+-----------+----------+--------------+  Summary: BILATERAL: - No evidence of deep vein thrombosis seen in the lower extremities, bilaterally. -No evidence of popliteal cyst, bilaterally.   *See table(s) above for measurements and observations. Electronically signed by Waverly Ferrari MD on 06/02/2021 at 9:07:31 PM.    Final     Anti-infectives: Anti-infectives (From admission, onward)    Start     Dose/Rate Route Frequency Ordered Stop   06/02/21 1045  piperacillin-tazobactam (ZOSYN) IVPB 2.25 g        2.25 g 100 mL/hr over 30 Minutes Intravenous Every 8 hours 06/02/21 0959     05/30/21 0800  cefTRIAXone (ROCEPHIN) 2 g in sodium chloride 0.9 % 100 mL IVPB  Status:  Discontinued        2 g 200 mL/hr over 30 Minutes Intravenous Every 24 hours 05/30/21 0747 06/02/21 0959   05/30/21 0800  metroNIDAZOLE (FLAGYL) IVPB 500 mg  Status:  Discontinued        500 mg 100 mL/hr over 60 Minutes Intravenous Every 12 hours 05/30/21 0747 06/02/21 0959   05/26/21 1000  cefTRIAXone (ROCEPHIN) 2 g in sodium chloride 0.9 % 100 mL IVPB        2 g 200 mL/hr over 30 Minutes Intravenous Every 24 hours 05/25/21 1618 05/28/21 1008   05/24/21 1130  cefTRIAXone (ROCEPHIN) 1 g in sodium chloride 0.9 % 100 mL IVPB  Status:  Discontinued        1 g 200 mL/hr over 30 Minutes Intravenous  Every 24 hours 05/24/21 1119 05/25/21 1618        Assessment/Plan POD 1 s/p Diagnostic Laparoscopy - Dr. Dwain Sarna - 06/02/2021 - The area that appeared abnormal on CT and concerning for ischemia was noted to be thickened bowel intra-op but viable. The cecum, appendix and remainder of the right colon was viable as well.  - Doing well today. Pain and exam improved. Having bowel function. Adv to CLD. If tolerates, may be able to advance to [facility]LD later today.   FEN - CLD, IVF per [facility] VTE - SCDs, okay for chemical prophylaxis from our standpoint ID - Rocephin 1/15 - 1/24. Flagyl 1/21 -1/24. Zosyn 1/24 >>  Cardiac arrest s/p CPR Asp PNA Acute metabolic encephalopathy Acute renal failure secondary to rhabdomyolysis with oliguria  Rhabdo  Etoh use R ileo psoas fluid collection  Moderate Medical Decision Making   LOS: 10 days    Jacinto Halim , [facility] 06/03/2021, 7:48 AM Please see Amion for pager number during day hours 7:00am-4:30pm

## 2021-06-03 NOTE — Plan of Care (Signed)

## 2021-06-03 NOTE — Progress Notes (Signed)
[facility] Progress Note   Subjective:   no UOP recorded but he says is making?    Had HD last night -  removed 1300-  seemed to tolerate well.  Exlap showed  thickened but viable bowel so none was resected-  there has been conversation about LE weakness-  found to have psoas muscle fluid collections appearing to be hematomas-  aspiration is not happening-  at MRI again this AM  Objective Vitals:   06/02/21 2035 06/02/21 2300 06/03/21 0400 06/03/21 0751  BP: 126/81 (!) 147/85 131/73 (!) 144/84  Pulse: 100   90  Resp: 18 14 17 17   Temp: 97.7 F (36.5 C) 98.4 F (36.9 C) 98.2 F (36.8 C) 98.2 F (36.8 C)  TempSrc: Oral Oral Oral Oral  SpO2: 98% 97% 99%   Weight:   86.6 kg   Height:       Physical Exam General: awake and alert-  spoke to him with interp  Heart: RRR, no rub Lungs: coarse BL Abdomen: soft, mildly distended Extremities: 1+ edema, feet less mottled, calves warm, R knee dressing in place Neuro:  awake and alert, oriented to self and [place], cannot wiggle L toes or dorsiflex, R can move toes Dialysis Access:  R IJ  temp HD catheter -  placed 1/23  Additional Objective Labs: Basic Metabolic Panel: Recent Labs  Lab 06/02/21 0440 06/02/21 1430 06/03/21 0408  NA 134* 132* 131*  K 4.3 5.0 4.6  CL 99 96* 96*  CO2 15* 12* 16*  GLUCOSE 98 111* 103*  BUN 149* 191* 130*  CREATININE 10.51* 11.37* 8.78*  CALCIUM 7.6* 8.5* 7.7*  PHOS 9.9* >30.0* 11.6*   Liver Function Tests: Recent Labs  Lab 05/28/21 0347 05/28/21 1615 06/02/21 1430 06/02/21 1610 06/03/21 0408  AST 1,193*  --   --  77*  --   ALT 896*  --   --  173*  --   ALKPHOS 110  --   --  158*  --   BILITOT 0.6  --   --  0.6  --   PROT 5.6*  --   --  5.0*  --   ALBUMIN 2.0*   < > 2.0* 1.9* 1.9*   < > = values in this interval not displayed.   No results for input(s): LIPASE, AMYLASE in the last 168 hours. CBC: Recent Labs  Lab 05/31/21 1544 06/01/21 0125 06/02/21 0440 06/02/21 1610  06/03/21 0408  WBC 18.3* 18.8* 24.9* 28.0* 23.3*  HGB 8.2* 8.8* 8.6* 8.7* 8.0*  HCT 24.2* 25.7* 25.2* 26.2* 24.4*  MCV 82.6 81.1 81.3 83.2 83.6  PLT 248 293 412* 467* 457*   Blood Culture No results found for: SDES, SPECREQUEST, CULT, REPTSTATUS  Cardiac Enzymes: Recent Labs  Lab 05/30/21 0340 06/02/21 1610 06/03/21 0408  CKTOTAL 13,372* 2,119* 1,166*   CBG: Recent Labs  Lab 05/30/21 2326 05/31/21 0353 05/31/21 0734 05/31/21 1547 05/31/21 2332  GLUCAP 103* 193* 137* 103* 105*   Iron Studies:  Recent Labs    06/02/21 0440  IRON 25*  TIBC 195*  FERRITIN 265   @[email or web address] @ Studies/Results: CT ABDOMEN PELVIS WO CONTRAST  Result Date: 06/01/2021 CLINICAL DATA:  Abdominal pain, acute, nonlocalized. Cardiac arrest 8 days ago. Renal failure secondary to rhabdomyolysis. EXAM: CT ABDOMEN AND PELVIS WITHOUT CONTRAST TECHNIQUE: Multidetector CT imaging of the abdomen and pelvis was performed following the standard protocol without IV contrast. RADIATION DOSE REDUCTION: This exam was performed according to the departmental dose-optimization program which  includes automated exposure control, adjustment of the mA and/or kV according to patient size and/or use of iterative reconstruction technique. COMPARISON:  CT 2 days ago. FINDINGS: Lower chest: Small right effusion layering dependently. Patchy peripheral areas of parenchymal density in both lower lobes appear similar to the previous study and could represent infectious pneumonia or pulmonary infarctions. Calcified subcarinal lymph nodes as seen previously. Pneumomediastinum and chest wall air as seen previously, possibly subsequent to previous resuscitation. No progressive or worsening finding. Hepatobiliary: Liver parenchyma appears normal without contrast. No calcified gallstones. Pancreas: Normal Spleen: Normal Adrenals/Urinary Tract: Adrenal glands are normal. The kidneys appear enlarged consistent with acute nephritis. No evidence  of obstruction or focal lesion. Bladder appears unremarkable, nearly empty. Small amount of air in the bladder as seen previously. Stomach/Bowel: Stomach appears normal. Question wall thickening in a segmental region of the ileum, possibly ischemic insult with this clinical history. No evidence of frank necrosis. No dilated small bowel. Appendix is normal. There may be mild edema of the right colon as well. Mild diverticulosis of the left colon. Vascular/Lymphatic: Aorta and IVC are normal. No adenopathy. Mild nonspecific retroperitoneal edema, often seen in critically ill patients lying supine. Reproductive: Normal Other: Tiny amount of free intraperitoneal fluid. Apparent muscular swelling of the iliopsoas and gluteal muscles that could go along with the clinical history of rhabdomyolysis. There may be some subtle hyperdensity within the right ileo psoas musculature consistent with some intramuscular hemorrhage. No large hematoma. IMPRESSION: Redemonstration of patchy bilateral peripheral lower lobe airspace density which could be due to pneumonia or pulmonary infarctions. No progressive change. Redemonstration of pneumomediastinum and soft tissue air within the anterior chest, presumably subsequent to resuscitation. No visible pneumothorax in the region studied. Redemonstration of bowel wall edema the distal ileum and possibly the proximal ascending colon, possibly ischemic in this setting. No progressive change or evidence of frank bowel necrosis. Enlarged kidneys consistent with the history of acute renal failure. No sign of obstruction. Muscular swelling, particularly of the ileo psoas and gluteal muscles consistent with the clinical history of rhabdomyolysis. Some hemorrhage noted within the right iliopsoas muscle, which I would not categorize as a frank or significant hematoma at this time. Electronically Signed   By: Nelson Chimes M.D.   On: 06/01/2021 11:33   DG CHEST PORT 1 VIEW  Result Date:  06/01/2021 CLINICAL DATA:  Placement of central venous catheter EXAM: PORTABLE CHEST 1 VIEW COMPARISON:  05/30/2021 FINDINGS: Transverse diameter of heart is increased. There is interval decrease in pulmonary vascular congestion and pulmonary edema. There is poor inspiration. There are no new focal infiltrates. There is interval placement of large caliber right IJ central venous catheter with its tip in the superior vena cava close to the right atrium. There is no significant pleural effusion or pneumothorax. IMPRESSION: There is interval decrease in pulmonary vascular congestion and pulmonary edema. There is no pneumothorax. Tip of right IJ central venous catheter is seen in the superior vena cava close to the right atrium. Electronically Signed   By: Elmer Picker M.D.   On: 06/01/2021 16:22   VAS [place] LOWER EXTREMITY VENOUS (DVT)  Result Date: 06/02/2021  Lower Venous DVT Study Patient Name:  Andres Braun [facility]  Date of Exam:   06/02/2021 Medical Rec #: [id number]          Accession #:    [id number] Date of Birth: 10-09-1975           Patient Gender: M Patient Age:   46  years Exam Location:  [facility] Procedure:      VAS [place] LOWER EXTREMITY VENOUS (DVT) Referring Phys: ANAND HONGALGI --------------------------------------------------------------------------------  Indications: Leukocytosis unclear etiol.  Comparison Study: no prior Performing Technologist: Archie Patten RVS  Examination Guidelines: A complete evaluation includes B-mode imaging, spectral Doppler, color Doppler, and power Doppler as needed of all accessible portions of each vessel. Bilateral testing is considered an integral part of a complete examination. Limited examinations for reoccurring indications may be performed as noted. The reflux portion of the exam is performed with the patient in reverse Trendelenburg.  +---------+---------------+---------+-----------+----------+--------------+  RIGHT      Compressibility Phasicity Spontaneity Properties Thrombus Aging  +---------+---------------+---------+-----------+----------+--------------+  CFV       Full            Yes       Yes                                    +---------+---------------+---------+-----------+----------+--------------+  SFJ       Full                                                             +---------+---------------+---------+-----------+----------+--------------+  FV Prox   Full                                                             +---------+---------------+---------+-----------+----------+--------------+  FV Mid    Full                                                             +---------+---------------+---------+-----------+----------+--------------+  FV Distal Full                                                             +---------+---------------+---------+-----------+----------+--------------+  PFV       Full                                                             +---------+---------------+---------+-----------+----------+--------------+  POP       Full            Yes       Yes                                    +---------+---------------+---------+-----------+----------+--------------+  PTV       Full                                                             +---------+---------------+---------+-----------+----------+--------------+  PERO      Full                                                             +---------+---------------+---------+-----------+----------+--------------+   +---------+---------------+---------+-----------+----------+--------------+  LEFT      Compressibility Phasicity Spontaneity Properties Thrombus Aging  +---------+---------------+---------+-----------+----------+--------------+  CFV       Full            Yes       Yes                                    +---------+---------------+---------+-----------+----------+--------------+  SFJ       Full                                                              +---------+---------------+---------+-----------+----------+--------------+  FV Prox   Full                                                             +---------+---------------+---------+-----------+----------+--------------+  FV Mid    Full                                                             +---------+---------------+---------+-----------+----------+--------------+  FV Distal Full                                                             +---------+---------------+---------+-----------+----------+--------------+  PFV       Full                                                             +---------+---------------+---------+-----------+----------+--------------+  POP       Full            Yes       Yes                                    +---------+---------------+---------+-----------+----------+--------------+  PTV       Full                                                             +---------+---------------+---------+-----------+----------+--------------+  PERO      Full                                                             +---------+---------------+---------+-----------+----------+--------------+     Summary: BILATERAL: - No evidence of deep vein thrombosis seen in the lower extremities, bilaterally. -No evidence of popliteal cyst, bilaterally.   *See table(s) above for measurements and observations. Electronically signed by Deitra Mayo MD on 06/02/2021 at 9:07:31 PM.    Final    Medications:  sodium chloride Stopped (05/29/21 1602)   dexmedetomidine (PRECEDEX) IV infusion Stopped (05/31/21 1601)   piperacillin-tazobactam (ZOSYN)  IV Stopped (06/03/21 0539)    B-complex with vitamin C  1 tablet Per Tube Daily   Chlorhexidine Gluconate Cloth  6 each Topical Q0600   Chlorhexidine Gluconate Cloth  6 each Topical Q0600   darbepoetin (ARANESP) injection - NON-DIALYSIS  100 mcg Subcutaneous Q Mon-1800   feeding supplement  237 mL Oral BID BM   folic acid  1 mg  Per Tube Daily   heparin injection (subcutaneous)  5,000 Units Subcutaneous Q8H   mouth rinse  15 mL Mouth Rinse BID   pantoprazole (PROTONIX) IV  40 mg Intravenous Q12H    Assessment/ Plan: AKI severe: in setting of cardiac arrest, rhabdo.  Oliguric to anuric.  Required CRRT 1/15 - 1/19.  Anuric despite high dose lasix 1/20.  HD 1/22  via femoral HD catheter-  also on 1/24 via new IJ line-  BUN still very high-  catabolic-  no UOP recorded -  very high BUN so no signs of renal recovery-  plan for another HD treatment tomorrow  SP cardiac arrest - asystolic arrest in setting of prob asp PNA/ ARDS in setting of EtOH Severe metabolic/ resp acidosis -   improved but now worsening again-  HD should help-  concern about ischemic bowel as well  AHRF - suspected aspiration pneumonitis w/ ARDS, on antibiotics per primary.  Now on  Hepatitis - shock, EtOH, rhabdo contributors; per primary AMS - improving but remains encephalopathic.   LE neurologic deficits - ortho and neuro as well as NSG seeing-  thought is fluid in psoas muscle could be causing some compression-   repeat imaging today  Possible ischemic bowel - surgery consulting-  ex lap did not show ischemic bowel needing resection but with some edema in bowel still   Anemia- hgb in the 8's but slowly drifting down with events -    iron stores low but defer iron right now given high WBC- giving ESA -  transfuse PRN    Andres Braun  06/03/2021, 8:49 AM  [facility] Pager: [phone]

## 2021-06-04 DIAGNOSIS — E43 Unspecified severe protein-calorie malnutrition: Secondary | ICD-10-CM

## 2021-06-04 LAB — CBC WITH DIFFERENTIAL/PLATELET
Abs Immature Granulocytes: 0.89 10*3/uL — ABNORMAL HIGH (ref 0.00–0.07)
Basophils Absolute: 0.1 10*3/uL (ref 0.0–0.1)
Basophils Relative: 0 %
Eosinophils Absolute: 0.3 10*3/uL (ref 0.0–0.5)
Eosinophils Relative: 1 %
HCT: 23.4 % — ABNORMAL LOW (ref 39.0–52.0)
Hemoglobin: 8.2 g/dL — ABNORMAL LOW (ref 13.0–17.0)
Immature Granulocytes: 4 %
Lymphocytes Relative: 10 %
Lymphs Abs: 2.1 10*3/uL (ref 0.7–4.0)
MCH: 28.5 pg (ref 26.0–34.0)
MCHC: 35 g/dL (ref 30.0–36.0)
MCV: 81.3 fL (ref 80.0–100.0)
Monocytes Absolute: 1.4 10*3/uL — ABNORMAL HIGH (ref 0.1–1.0)
Monocytes Relative: 6 %
Neutro Abs: 17.2 10*3/uL — ABNORMAL HIGH (ref 1.7–7.7)
Neutrophils Relative %: 79 %
Platelets: 542 10*3/uL — ABNORMAL HIGH (ref 150–400)
RBC: 2.88 MIL/uL — ABNORMAL LOW (ref 4.22–5.81)
RDW: 13.7 % (ref 11.5–15.5)
WBC: 21.9 10*3/uL — ABNORMAL HIGH (ref 4.0–10.5)
nRBC: 0 % (ref 0.0–0.2)

## 2021-06-04 LAB — COMPREHENSIVE METABOLIC PANEL
ALT: 110 U/L — ABNORMAL HIGH (ref 0–44)
AST: 42 U/L — ABNORMAL HIGH (ref 15–41)
Albumin: 1.8 g/dL — ABNORMAL LOW (ref 3.5–5.0)
Alkaline Phosphatase: 136 U/L — ABNORMAL HIGH (ref 38–126)
Anion gap: 18 — ABNORMAL HIGH (ref 5–15)
BUN: 156 mg/dL — ABNORMAL HIGH (ref 6–20)
CO2: 18 mmol/L — ABNORMAL LOW (ref 22–32)
Calcium: 7.5 mg/dL — ABNORMAL LOW (ref 8.9–10.3)
Chloride: 95 mmol/L — ABNORMAL LOW (ref 98–111)
Creatinine, Ser: 10.31 mg/dL — ABNORMAL HIGH (ref 0.61–1.24)
GFR, Estimated: 6 mL/min — ABNORMAL LOW (ref >60)
Glucose, Bld: 102 mg/dL — ABNORMAL HIGH (ref 70–99)
Potassium: 4.6 mmol/L (ref 3.5–5.1)
Sodium: 131 mmol/L — ABNORMAL LOW (ref 135–145)
Total Bilirubin: 0.4 mg/dL (ref 0.3–1.2)
Total Protein: 4.8 g/dL — ABNORMAL LOW (ref 6.5–8.1)

## 2021-06-04 LAB — CK: Total CK: 783 U/L — ABNORMAL HIGH (ref 49–397)

## 2021-06-04 LAB — PHOSPHORUS: Phosphorus: >30 mg/dL — ABNORMAL HIGH (ref 2.5–4.6)

## 2021-06-04 MED ORDER — HEPARIN SODIUM (PORCINE) 1000 UNIT/ML IJ SOLN
INTRAMUSCULAR | Status: AC
  Filled 2021-06-04: qty 3

## 2021-06-04 MED ORDER — DIPHENHYDRAMINE HCL 50 MG/ML IJ SOLN
50.0000 mg | Freq: Once | INTRAMUSCULAR | Status: AC
  Administered 2021-06-04: 50 mg via INTRAVENOUS
  Filled 2021-06-04: qty 1

## 2021-06-04 NOTE — Progress Notes (Signed)
2 Days Post-Op  Subjective: CC: Spanish interpreter used. RN at bedside. Patient reports no abdominal pain. Tolerating fld and shakes without n/v. Having bowel movements. No further bloody bm's.   Objective: Vital signs in last 24 hours: Temp:  [97.4 F (36.3 C)-98.5 F (36.9 C)] 98 F (36.7 C) (01/26 0300) Pulse Rate:  [90] 90 (01/25 1328) Resp:  [15-19] 15 (01/26 0300) BP: (132-158)/(77-87) 158/81 (01/26 0300) SpO2:  [94 %-96 %] 94 % (01/26 0300) Weight:  [87.8 kg] 87.8 kg (01/26 0430) Last BM Date: 06/04/21  Intake/Output from previous day: 01/25 0701 - 01/26 0700 In: 2074.1 [P.O.:1914; IV Piggyback:160.1] Out: 150 [Urine:150] Intake/Output this shift: No intake/output data recorded.  PE: Gen:  Alert, NAD, pleasant Pulm:  Normal rate and effort  Abd: ND and very soft. Minimally tender around laparoscopic incisions sites that appears appropriate - otherwise no abdominal ttp. No peritonitis. +bs. Laparoscopic incisions with steri-strips in place, c/d/I.   Lab Results:  Recent Labs    06/03/21 0408 06/04/21 0248  WBC 23.3* 21.9*  HGB 8.0* 8.2*  HCT 24.4* 23.4*  PLT 457* 542*   BMET Recent Labs    06/03/21 0408 06/04/21 0248  NA 131* 131*  K 4.6 4.6  CL 96* 95*  CO2 16* 18*  GLUCOSE 103* 102*  BUN 130* 156*  CREATININE 8.78* 10.31*  CALCIUM 7.7* 7.5*   PT/INR No results for input(s): LABPROT, INR in the last 72 hours. CMP     Component Value Date/Time   NA 131 (L) 06/04/2021 0248   K 4.6 06/04/2021 0248   CL 95 (L) 06/04/2021 0248   CO2 18 (L) 06/04/2021 0248   GLUCOSE 102 (H) 06/04/2021 0248   BUN 156 (H) 06/04/2021 0248   CREATININE 10.31 (H) 06/04/2021 0248   CALCIUM 7.5 (L) 06/04/2021 0248   PROT 4.8 (L) 06/04/2021 0248   ALBUMIN 1.8 (L) 06/04/2021 0248   AST 42 (H) 06/04/2021 0248   ALT 110 (H) 06/04/2021 0248   ALKPHOS 136 (H) 06/04/2021 0248   BILITOT 0.4 06/04/2021 0248   GFRNONAA 6 (L) 06/04/2021 0248   Lipase  No results found  for: LIPASE  Studies/Results: MR FEMUR LEFT W WO CONTRAST  Result Date: 06/03/2021 CLINICAL DATA:  Left thigh swelling with concern for potential compartment syndrome causing femoral nerve deficit. Muscular swelling on CT suspicious for rhabdomyolysis. Soft tissue infection suspected. EXAM: MR OF THE LEFT LOWER EXTREMITY WITHOUT AND WITH CONTRAST TECHNIQUE: Multiplanar, multisequence MR imaging of the left thigh was performed both before and after administration of intravenous contrast. CONTRAST:  32mL GADAVIST GADOBUTROL 1 MMOL/ML IV SOLN COMPARISON:  Abdominopelvic CT 06/01/2021 and 05/30/2021. FINDINGS: Bones/Joint/Cartilage No evidence of acute fracture, dislocation or bone destruction. There is no abnormal osseous signal or enhancement. There is no significant hip joint effusion. The knee joint is incompletely visualized. Ligaments Not relevant for exam/indication. Muscles and Tendons There is generalized T2 hyperintensity throughout the left thigh musculature which is greatest inferiorly in the left gluteus maximus muscle. The muscle is enlarged with heterogeneous T2 signal and heterogeneous enhancement following contrast. There are irregular areas of decreased enhancement within the muscle which may reflect necrosis. No well-defined abscess demonstrated. Similar heterogeneous enhancement is present within the quadratus femoris muscle. Lesser enhancement is seen throughout the additional left thigh musculature. There is also some edema and enhancement within the visualized right thigh musculature. Soft tissues Moderate generalized subcutaneous edema throughout the left thigh without focal fluid collection or foreign body. No evidence  of soft tissue emphysema. There is heterogeneous T2 hyperintensity and enhancement surrounding the sciatic nerve which appears normal in caliber and signal. No vascular abnormalities are apparent. Soft tissue edema is noted in the lower pelvis. IMPRESSION: 1. Diffuse muscular  abnormalities throughout the visualized portions of both thighs with heterogeneous T2 signal and enhancement, especially within the left gluteus maximus and quadratus femoris muscles. Findings are compatible with the diagnosis of rhabdomyolysis, with the differential including myositis related to connective tissue disease and polymyositis. No drainable abscess identified. 2. Associated diffuse subcutaneous edema which is nonspecific and may reflect cellulitis. 3. These findings could contribute to compartment syndrome in the appropriate clinical context. This requires clinical evaluation to confirm/exclude. 4. No evidence of osteomyelitis or septic arthritis. Electronically Signed   By: Richardean Sale M.D.   On: 06/03/2021 09:52   DG CHEST PORT 1 VIEW  Result Date: 06/03/2021 CLINICAL DATA:  Cardiac arrest, chest soreness, pneumomediastinum. EXAM: PORTABLE CHEST 1 VIEW COMPARISON:  06/01/2021. FINDINGS: Right IJ central line tip is in the SVC. Heart size stable. Lungs are low in volume with platelike density in the right perihilar region. There may be additional subsegmental airspace opacification in the left lower lobe. No appreciable residual pneumomediastinum. IMPRESSION: 1. Low lung volumes with subsegmental atelectasis in the right perihilar region and left lower lobe. 2. No definite residual pneumomediastinum. Electronically Signed   By: Lorin Picket M.D.   On: 06/03/2021 08:47   VAS [place] LOWER EXTREMITY VENOUS (DVT)  Result Date: 06/02/2021  Lower Venous DVT Study Patient Name:  Andres Braun [facility]  Date of Exam:   06/02/2021 Medical Rec #: [id number]          Accession #:    [id number] Date of Birth: 06-Mar-1976           Patient Gender: M Patient Age:   46 years Exam Location:  [facility] [place] Procedure:      VAS [place] LOWER EXTREMITY VENOUS (DVT) Referring Phys: ANAND HONGALGI --------------------------------------------------------------------------------  Indications: Leukocytosis unclear etiol.   Comparison Study: no prior Performing Technologist: Archie Patten RVS  Examination Guidelines: A complete evaluation includes B-mode imaging, spectral Doppler, color Doppler, and power Doppler as needed of all accessible portions of each vessel. Bilateral testing is considered an integral part of a complete examination. Limited examinations for reoccurring indications may be performed as noted. The reflux portion of the exam is performed with the patient in reverse Trendelenburg.  +---------+---------------+---------+-----------+----------+--------------+  RIGHT     Compressibility Phasicity Spontaneity Properties Thrombus Aging  +---------+---------------+---------+-----------+----------+--------------+  CFV       Full            Yes       Yes                                    +---------+---------------+---------+-----------+----------+--------------+  SFJ       Full                                                             +---------+---------------+---------+-----------+----------+--------------+  FV Prox   Full                                                             +---------+---------------+---------+-----------+----------+--------------+  FV Mid    Full                                                             +---------+---------------+---------+-----------+----------+--------------+  FV Distal Full                                                             +---------+---------------+---------+-----------+----------+--------------+  PFV       Full                                                             +---------+---------------+---------+-----------+----------+--------------+  POP       Full            Yes       Yes                                    +---------+---------------+---------+-----------+----------+--------------+  PTV       Full                                                             +---------+---------------+---------+-----------+----------+--------------+  PERO      Full                                                              +---------+---------------+---------+-----------+----------+--------------+   +---------+---------------+---------+-----------+----------+--------------+  LEFT      Compressibility Phasicity Spontaneity Properties Thrombus Aging  +---------+---------------+---------+-----------+----------+--------------+  CFV       Full            Yes       Yes                                    +---------+---------------+---------+-----------+----------+--------------+  SFJ       Full                                                             +---------+---------------+---------+-----------+----------+--------------+  FV Prox   Full                                                             +---------+---------------+---------+-----------+----------+--------------+  FV Mid    Full                                                             +---------+---------------+---------+-----------+----------+--------------+  FV Distal Full                                                             +---------+---------------+---------+-----------+----------+--------------+  PFV       Full                                                             +---------+---------------+---------+-----------+----------+--------------+  POP       Full            Yes       Yes                                    +---------+---------------+---------+-----------+----------+--------------+  PTV       Full                                                             +---------+---------------+---------+-----------+----------+--------------+  PERO      Full                                                             +---------+---------------+---------+-----------+----------+--------------+     Summary: BILATERAL: - No evidence of deep vein thrombosis seen in the lower extremities, bilaterally. -No evidence of popliteal cyst, bilaterally.   *See table(s) above for measurements and observations.  Electronically signed by Deitra Mayo MD on 06/02/2021 at 9:07:31 PM.    Final     Anti-infectives: Anti-infectives (From admission, onward)    Start     Dose/Rate Route Frequency Ordered Stop   06/02/21 1045  piperacillin-tazobactam (ZOSYN) IVPB 2.25 g        2.25 g 100 mL/hr over 30 Minutes Intravenous Every 8 hours 06/02/21 0959     05/30/21 0800  cefTRIAXone (ROCEPHIN) 2 g in sodium chloride 0.9 % 100 mL IVPB  Status:  Discontinued        2 g 200 mL/hr over 30 Minutes Intravenous Every 24 hours 05/30/21 0747 06/02/21 0959   05/30/21 0800  metroNIDAZOLE (FLAGYL) IVPB 500 mg  Status:  Discontinued        500 mg 100 mL/hr over 60 Minutes Intravenous Every 12 hours 05/30/21 0747 06/02/21 0959   05/26/21 1000  cefTRIAXone (ROCEPHIN) 2 g in sodium chloride  0.9 % 100 mL IVPB        2 g 200 mL/hr over 30 Minutes Intravenous Every 24 hours 05/25/21 1618 05/28/21 1008   05/24/21 1130  cefTRIAXone (ROCEPHIN) 1 g in sodium chloride 0.9 % 100 mL IVPB  Status:  Discontinued        1 g 200 mL/hr over 30 Minutes Intravenous Every 24 hours 05/24/21 1119 05/25/21 1618        Assessment/Plan POD 2 s/p Diagnostic Laparoscopy - Dr. Donne Hazel - 06/02/2021 - The area that appeared abnormal on CT and concerning for ischemia was noted to be thickened bowel intra-op but viable. The cecum, appendix and remainder of the right colon was viable as well. There was no evidence of obstruction.  - Tolerating FLD without n/v, abdominal pain has resolved, appropriately tender around laparoscopic incisions on exam - otherwise NT and having bowel function. WBC downtrending. We will advance to soft diet and follow peripherally. Please call back with any questions or concerns.    FEN - Soft, IVF per TRH VTE - SCDs, subq heparin ID - Rocephin 1/15 - 1/24. Flagyl 1/21 -1/24. Zosyn 1/24 >>    Cardiac arrest s/p CPR Asp PNA Acute metabolic encephalopathy Acute renal failure secondary to rhabdomyolysis with  oliguria  Rhabdo  Etoh use R ileo psoas fluid collection  Moderate Medical Decision Making   LOS: 11 days    Jillyn Ledger , [facility] 06/04/2021, 7:51 AM Please see Amion for pager number during day hours 7:00am-4:30pm

## 2021-06-04 NOTE — Progress Notes (Signed)
[facility] Progress Note   Subjective:   150 UOP recorded.   BUN and crt rising-  planning for HD today -  looks better-   joking with interpreter  Objective Vitals:   06/03/21 1946 06/03/21 2331 06/04/21 0300 06/04/21 0430  BP: (!) 152/83 (!) 158/87 (!) 158/81   Pulse:      Resp: 18 19 15    Temp: 97.6 F (36.4 C) (!) 97.4 F (36.3 C) 98 F (36.7 C)   TempSrc: Oral Oral Oral   SpO2:  96% 94%   Weight:    87.8 kg  Height:       Physical Exam General: awake and alert-  spoke to him with interp  Heart: RRR, no rub Lungs: coarse BL Abdomen: soft, mildly distended Extremities: 1+ edema, feet less mottled, calves warm, R knee dressing in place Neuro:  awake and alert, oriented to self and [place], cannot wiggle L toes or dorsiflex, R can move toes Dialysis Access:  R IJ  temp HD catheter -  placed 1/23  Additional Objective Labs: Basic Metabolic Panel: Recent Labs  Lab 06/02/21 1430 06/03/21 0408 06/04/21 0248  NA 132* 131* 131*  K 5.0 4.6 4.6  CL 96* 96* 95*  CO2 12* 16* 18*  GLUCOSE 111* 103* 102*  BUN 191* 130* 156*  CREATININE 11.37* 8.78* 10.31*  CALCIUM 8.5* 7.7* 7.5*  PHOS >30.0* 11.6* >30.0*   Liver Function Tests: Recent Labs  Lab 06/02/21 1610 06/03/21 0408 06/04/21 0248  AST 77*  --  42*  ALT 173*  --  110*  ALKPHOS 158*  --  136*  BILITOT 0.6  --  0.4  PROT 5.0*  --  4.8*  ALBUMIN 1.9* 1.9* 1.8*   No results for input(s): LIPASE, AMYLASE in the last 168 hours. CBC: Recent Labs  Lab 06/01/21 0125 06/02/21 0440 06/02/21 1610 06/03/21 0408 06/04/21 0248  WBC 18.8* 24.9* 28.0* 23.3* 21.9*  NEUTROABS  --   --   --   --  17.2*  HGB 8.8* 8.6* 8.7* 8.0* 8.2*  HCT 25.7* 25.2* 26.2* 24.4* 23.4*  MCV 81.1 81.3 83.2 83.6 81.3  PLT 293 412* 467* 457* 542*   Blood Culture No results found for: Venida Jarvis, REPTSTATUS  Cardiac Enzymes: Recent Labs  Lab 05/30/21 0340 06/02/21 1610 06/03/21 0408 06/04/21 0248   CKTOTAL 13,372* 2,119* 1,166* 783*   CBG: Recent Labs  Lab 05/30/21 2326 05/31/21 0353 05/31/21 0734 05/31/21 1547 05/31/21 2332  GLUCAP 103* 193* 137* 103* 105*   Iron Studies:  Recent Labs    06/02/21 0440  IRON 25*  TIBC 195*  FERRITIN 265   @[email or web address] @ Studies/Results: MR FEMUR LEFT W WO CONTRAST  Result Date: 06/03/2021 CLINICAL DATA:  Left thigh swelling with concern for potential compartment syndrome causing femoral nerve deficit. Muscular swelling on CT suspicious for rhabdomyolysis. Soft tissue infection suspected. EXAM: MR OF THE LEFT LOWER EXTREMITY WITHOUT AND WITH CONTRAST TECHNIQUE: Multiplanar, multisequence MR imaging of the left thigh was performed both before and after administration of intravenous contrast. CONTRAST:  27mL GADAVIST GADOBUTROL 1 MMOL/ML IV SOLN COMPARISON:  Abdominopelvic CT 06/01/2021 and 05/30/2021. FINDINGS: Bones/Joint/Cartilage No evidence of acute fracture, dislocation or bone destruction. There is no abnormal osseous signal or enhancement. There is no significant hip joint effusion. The knee joint is incompletely visualized. Ligaments Not relevant for exam/indication. Muscles and Tendons There is generalized T2 hyperintensity throughout the left thigh musculature which is greatest inferiorly in the left gluteus maximus  muscle. The muscle is enlarged with heterogeneous T2 signal and heterogeneous enhancement following contrast. There are irregular areas of decreased enhancement within the muscle which may reflect necrosis. No well-defined abscess demonstrated. Similar heterogeneous enhancement is present within the quadratus femoris muscle. Lesser enhancement is seen throughout the additional left thigh musculature. There is also some edema and enhancement within the visualized right thigh musculature. Soft tissues Moderate generalized subcutaneous edema throughout the left thigh without focal fluid collection or foreign body. No evidence of soft  tissue emphysema. There is heterogeneous T2 hyperintensity and enhancement surrounding the sciatic nerve which appears normal in caliber and signal. No vascular abnormalities are apparent. Soft tissue edema is noted in the lower pelvis. IMPRESSION: 1. Diffuse muscular abnormalities throughout the visualized portions of both thighs with heterogeneous T2 signal and enhancement, especially within the left gluteus maximus and quadratus femoris muscles. Findings are compatible with the diagnosis of rhabdomyolysis, with the differential including myositis related to connective tissue disease and polymyositis. No drainable abscess identified. 2. Associated diffuse subcutaneous edema which is nonspecific and may reflect cellulitis. 3. These findings could contribute to compartment syndrome in the appropriate clinical context. This requires clinical evaluation to confirm/exclude. 4. No evidence of osteomyelitis or septic arthritis. Electronically Signed   By: Richardean Sale M.D.   On: 06/03/2021 09:52   DG CHEST PORT 1 VIEW  Result Date: 06/03/2021 CLINICAL DATA:  Cardiac arrest, chest soreness, pneumomediastinum. EXAM: PORTABLE CHEST 1 VIEW COMPARISON:  06/01/2021. FINDINGS: Right IJ central line tip is in the SVC. Heart size stable. Lungs are low in volume with platelike density in the right perihilar region. There may be additional subsegmental airspace opacification in the left lower lobe. No appreciable residual pneumomediastinum. IMPRESSION: 1. Low lung volumes with subsegmental atelectasis in the right perihilar region and left lower lobe. 2. No definite residual pneumomediastinum. Electronically Signed   By: Lorin Picket M.D.   On: 06/03/2021 08:47   VAS [place] LOWER EXTREMITY VENOUS (DVT)  Result Date: 06/02/2021  Lower Venous DVT Study Patient Name:  Andres Braun [facility]  Date of Exam:   06/02/2021 Medical Rec #: [id number]          Accession #:    [id number] Date of Birth: July 26, 1975           Patient Gender: M  Patient Age:   46 years Exam Location:  [facility] Procedure:      VAS [place] LOWER EXTREMITY VENOUS (DVT) Referring Phys: ANAND HONGALGI --------------------------------------------------------------------------------  Indications: Leukocytosis unclear etiol.  Comparison Study: no prior Performing Technologist: Archie Patten RVS  Examination Guidelines: A complete evaluation includes B-mode imaging, spectral Doppler, color Doppler, and power Doppler as needed of all accessible portions of each vessel. Bilateral testing is considered an integral part of a complete examination. Limited examinations for reoccurring indications may be performed as noted. The reflux portion of the exam is performed with the patient in reverse Trendelenburg.  +---------+---------------+---------+-----------+----------+--------------+  RIGHT     Compressibility Phasicity Spontaneity Properties Thrombus Aging  +---------+---------------+---------+-----------+----------+--------------+  CFV       Full            Yes       Yes                                    +---------+---------------+---------+-----------+----------+--------------+  SFJ       Full                                                             +---------+---------------+---------+-----------+----------+--------------+  FV Prox   Full                                                             +---------+---------------+---------+-----------+----------+--------------+  FV Mid    Full                                                             +---------+---------------+---------+-----------+----------+--------------+  FV Distal Full                                                             +---------+---------------+---------+-----------+----------+--------------+  PFV       Full                                                             +---------+---------------+---------+-----------+----------+--------------+  POP       Full            Yes       Yes                                     +---------+---------------+---------+-----------+----------+--------------+  PTV       Full                                                             +---------+---------------+---------+-----------+----------+--------------+  PERO      Full                                                             +---------+---------------+---------+-----------+----------+--------------+   +---------+---------------+---------+-----------+----------+--------------+  LEFT      Compressibility Phasicity Spontaneity Properties Thrombus Aging  +---------+---------------+---------+-----------+----------+--------------+  CFV       Full            Yes       Yes                                    +---------+---------------+---------+-----------+----------+--------------+  SFJ       Full                                                             +---------+---------------+---------+-----------+----------+--------------+  FV Prox   Full                                                             +---------+---------------+---------+-----------+----------+--------------+  FV Mid    Full                                                             +---------+---------------+---------+-----------+----------+--------------+  FV Distal Full                                                             +---------+---------------+---------+-----------+----------+--------------+  PFV       Full                                                             +---------+---------------+---------+-----------+----------+--------------+  POP       Full            Yes       Yes                                    +---------+---------------+---------+-----------+----------+--------------+  PTV       Full                                                             +---------+---------------+---------+-----------+----------+--------------+  PERO      Full                                                              +---------+---------------+---------+-----------+----------+--------------+     Summary: BILATERAL: - No evidence of deep vein thrombosis seen in the lower extremities, bilaterally. -No evidence of popliteal cyst, bilaterally.   *See table(s) above for measurements and observations. Electronically signed by Waverly Ferrari MD on 06/02/2021 at 9:07:31 PM.    Final    Medications:  sodium chloride Stopped (05/29/21 1602)   piperacillin-tazobactam (ZOSYN)  IV Stopped (06/04/21 0601)    Chlorhexidine Gluconate Cloth  6 each Topical Q0600   Chlorhexidine Gluconate Cloth  6 each Topical Q0600   darbepoetin (ARANESP) injection - NON-DIALYSIS  100 mcg Subcutaneous Q Mon-1800   feeding supplement  237 mL Oral TID BM   folic acid  1 mg Per Tube Daily   Gerhardt's butt cream  Topical TID   heparin injection (subcutaneous)  5,000 Units Subcutaneous Q8H   multivitamin  1 tablet Oral QHS   pantoprazole (PROTONIX) IV  40 mg Intravenous Q12H    Assessment/ Plan: AKI severe: in setting of cardiac arrest, rhabdo.  Oliguric to anuric.  Required CRRT 1/15 - 1/19.   HD 1/22  via femoral HD catheter-  also on 1/24 via new IJ line-  BUN still very high-  catabolic-  some UOP recorded -  very high BUN so no significant signs of renal recovery-  plan for another HD treatment today-  then follow for signs of recovery SP cardiac arrest - asystolic arrest in setting of prob asp PNA/ ARDS in setting of EtOH Severe metabolic/ resp acidosis -   improved but now worsening again-  HD should help-  concern about ischemic bowel as well -  overall imroving  AHRF - suspected aspiration pneumonitis w/ ARDS, on antibiotics per primary.  Now on [place] Hepatitis - shock, EtOH, rhabdo contributors; per primary AMS - improving but remains encephalopathic.   LE neurologic deficits - ortho and neuro as well as NSG seeing-  thought is fluid in psoas muscle could be causing some compression-   repeat imaging today  Possible ischemic bowel  - surgery consulting-  ex lap did not show ischemic bowel needing resection but with some edema in bowel still   Anemia- hgb in the 8's but slowly drifting down with events -    iron stores low but defer iron right now given high WBC- giving ESA -  transfuse PRN    Louis Meckel  06/04/2021, 7:49 AM  [facility] Pager: [phone]

## 2021-06-04 NOTE — Progress Notes (Signed)
Patient brief changed, patient via bed to dialysis. Patient with c/o a lot of pain when moved to change brief below the nipple line, declined need for pain medicine

## 2021-06-04 NOTE — Discharge Instructions (Signed)
CIRUGIA LAPAROSCOPICA: INSTRUCCIONES DE POST OPERATORIO.  Revise siempre los documentos que le entreguen en el lugar donde se ha hecho la cirugia.  SI USTED NECESITA DOCUMENTOS DE INCAPACIDAD (DISABLE) O DE PERMISO FAMILAR (FAMILY LEAVE) NECESITA TRAERLOS A LA OFICINA PARA QUE SEAN PROCESADOS. NO  SE LOS DE A SU DOCTOR. A su alta del hospital se le dara una receta para controlar el dolor. Tomela como ha sido recetada, si la necesita. Si no la necesita puede tomar, Acetaminofen (Tylenol) o Ibuprofen (Advil) para aliviar dolor moderado. Continue tomando el resto de sus medicinas. Si necesita rellenar la receta, llame a la farmacia. ellos contactan a nuestra oficina pidiendo autorizacion. Este tipo de receta no pueden ser rellenadas despues de las  5pm o durante los fines de semana. Con relacion a la dieta: debe ser ligera los primeros dias despues que llege a la casa. Ejemplo: sopas y galleticas. Tome bastante liquido esos dias. La mayoria de los pacientes padecen de inflamacion y cambio de coloracion de la piel alrededor de las incisiones. esto toma dias en resolver.  pnerse una bolsa de hielo en el area affectada ayuda..  Es comun tambien tener un poco de estrenimiento si esta tomado medicinas para el dolor. incremente la cantidad de liquidos a tomar y puede tomar (Colace) esto previene el problema. Si ya tiene estrenimiento, es decir no ha defecado en 48 horas, puede tomar un laxativo (Milk of Magnesia or Miralax) uselo como el paquete le explica.  A menos que se le diga algo diferente. Remueva el bendaje a las 24-48 horas despues dela cirugia. y puede banarse en la ducha sin ningun problema. usted puede tener steri-strips (pequenas curitas transparentes en la piel puesta encima de la incision)  Estas banditas strips should be left on the skin for 7-10 days.   Si su cirujano puso pegamento encima de la incision usted puede banarse bajo la ducha en 24 horas. Este pegamento empezara a caerse en las  proximas 2-3 semanas. Si le pusieron suturas o presillas (grapos) estos seran quitados en su proxima cita en la oficina. . ACTIVIDADES:  Puede hacer actividad ligera.  Como caminar , subir escaleras y poco a poco irlas incrementando tanto como las tolere. Puede tener relaciones sexuales cuando sea comfortable. No carge objetos pesados o haga esfuerzos que no sean aprovados por su doctor. Puede manejar en cuanto no esta tomando medicamentos fuertes (narcoticos) para el dolor, pueda abrochar confortablemente el cinturon de seguridad, y pueda maniobrar y usar los pedales de su vehiculo con seguridad. PUEDE REGRESAR A TRABAJAR  Debe ver a su doctor para una cita de seguimiento en 2-3 semanas despues de la cirugia.  OTRAS ISNSTRUCCIONES:___________________________________________________________________________________ CUANDO LLAMAR A SU MEDICO: FIEBRE mayor de  101.0 No produccion de orina. Sangramiento continue de la herida Incremento de dolor, enrojecimientio o drenaje de la herida (incision) Incremento de dolor abdominal.  The clinic staff is available to answer your questions during regular business hours.  Please don't hesitate to call and ask to speak to one of the nurses for clinical concerns.  If you have a medical emergency, go to the nearest emergency room or call 911.  A surgeon from [facility] is always on call at the hospital. [address], [address], [place], [place]  [address] ? [address] [address], [place], [place]   [address] [phone] ? [phone] ? FAX [phone] Web site: [email or web address]  

## 2021-06-04 NOTE — Progress Notes (Signed)
Called by RN and informed that pt complains of burning sensation in left leg for days. RN speaks spanish and pt has limited english comprehension.  He is A&O x3 per RN report. He states his leg has been feeling this way and he cannot sleep. Reviewed his chart and MRI report of left leg.  Given Benadryl 50 mg IV tonight.  Will need further assessment in am with interpretor if daytime attending does not speak fluent spanish

## 2021-06-04 NOTE — Progress Notes (Signed)
PROGRESS NOTE    Andres Braun  [id number] DOB: 01/15/1976 DOA: 05/24/2021 PCP: Merryl Hacker, No   Chief Complaint  Patient presents with   Cardiac Arrest    Brief Narrative:   46 year old male admitted to ICU 1/15 under PCCM care after cardiac arrest at home.  PEA arrest, s/p CPR x20 minutes and epi x5.  Patient reportedly out drinking with his friends the night prior to admission.  Found by friends with agonal breathing.  Intubated on arrival, needed vasopressors, noted to have renal failure, rhabdomyolysis with CK in the 14 K range, treated for aspiration pneumonia, nephrology consulted and started on CRRT.  Extubated 1/19 and off pressors.  Mental status slowly improved.  Transferred to telemetry.  Care transferred to [facility] on 1/24.  Nephrology following for HD needs.  General surgery on board for questionable small bowel ischemia and taken emergently to surgery 1/24. The bowel in question was found to be thickened, but viable.    Assessment & Plan:   Principal Problem:   Cardiac arrest with pulseless electrical activity ([place]) Active Problems:   Acute respiratory failure with hypoxia and hypercapnia (HCC)   Septic shock due to undetermined organism ([place])   AKI (acute kidney injury) ([place])   Acute metabolic encephalopathy   Aspiration pneumonia of both lower lobes due to vomit (HCC)   Protein-calorie malnutrition, severe   Acute hypoxic respiratory failure/aspiration pneumonia Patient intubated on 05/24/2021 and extubated on 05/28/2021 patient currently on room air. Patient completed the course of antibiotics by 20/19/23.    Ischemic enteritis versus colitis with ileus General surgery consulted, there was concerned about an ischemic bowel.  Patient was taken to the OR for diagnostic laparoscopy, surgery revealed bowel in question is [place] no ischemia was seen hence no further intervention at this time.  Advance diet as tolerated without any issues at this time.   Lower  extremity weakness: -MRI of the neuraxis  was unremarkable for acute process .  MRI of the femur shows Diffuse muscular abnormalities throughout the visualized portions of both thighs with heterogeneous T2 signal and enhancement, especially within the left gluteus maximus and quadratus femoris muscles. Findings are compatible with the diagnosis of rhabdomyolysis, with the differential including myositis related to connective tissue disease and polymyositis. No drainable abscess identified. pt unable to move the left lower extremity from severe pain. Pt has good pulse in DP, no sensory deficits.  Therapy evaluations recommending CIR.     Acute metabolic encephalopathy Improved. He is alert and answering questions appropriately.    Right anterior knee wound:  -wound care consult.    Acute kidney injury with metabolic acidosis/rhabdomyolysis Patient has been off CRRT as of 05/28/2021. Nephrology on board and appreciate recommendations.  Improving CK levels.   Hyponatremia -Secondary to CKD.   Severe protein calorie malnutrition. Supplementation added.  Mechanical fall 1/24 and hit his head -As per nursing report, prior to going to the OR, patient attempted to get out of the bed, fell and hit his head without any obvious external injuries or LOC.  Patient's friend was in the room. Advised nursing regarding strict fall precautions, placement of safety sitter if needed and monitor closely.    Anemia of chronic disease in the setting of colitis Transfuse  to keep hemoglobin greater than 7.      DVT prophylaxis: Heparin.  Code Status: (Full Code) Family Communication: none at bedside.  Disposition:   Status is: Inpatient  Remains inpatient appropriate because: unsafe d/c plan.  Consultants:  Nephrology Surgery.   Procedures: none.    Antimicrobials: Antibiotics Given (last 72 hours)     Date/Time Action Medication Dose Rate   06/01/21 1151 New Bag/Given    metroNIDAZOLE (FLAGYL) IVPB 500 mg 500 mg 100 mL/hr   06/01/21 1607 New Bag/Given   cefTRIAXone (ROCEPHIN) 2 g in sodium chloride 0.9 % 100 mL IVPB 2 g 200 mL/hr   06/01/21 2213 New Bag/Given   metroNIDAZOLE (FLAGYL) IVPB 500 mg 500 mg 100 mL/hr   06/02/21 1339 Given   piperacillin-tazobactam (ZOSYN) IVPB 2.25 g 2.25 g    06/02/21 2220 New Bag/Given   piperacillin-tazobactam (ZOSYN) IVPB 2.25 g 2.25 g 100 mL/hr   06/03/21 0509 New Bag/Given   piperacillin-tazobactam (ZOSYN) IVPB 2.25 g 2.25 g 100 mL/hr   06/03/21 1453 New Bag/Given   piperacillin-tazobactam (ZOSYN) IVPB 2.25 g 2.25 g 100 mL/hr   06/03/21 2127 New Bag/Given   piperacillin-tazobactam (ZOSYN) IVPB 2.25 g 2.25 g 100 mL/hr   06/04/21 0525 New Bag/Given   piperacillin-tazobactam (ZOSYN) IVPB 2.25 g 2.25 g 100 mL/hr         Subjective: No chest pain or sob. Reports still has a lot of pain in th e left leg, unable to move it.   Objective: Vitals:   06/03/21 2331 06/04/21 0300 06/04/21 0430 06/04/21 0806  BP: (!) 158/87 (!) 158/81  (!) 170/91  Pulse:    (!) 102  Resp: 19 15  19   Temp: (!) 97.4 F (36.3 C) 98 F (36.7 C)  98 F (36.7 C)  TempSrc: Oral Oral  Oral  SpO2: 96% 94%    Weight:   87.8 kg   Height:        Intake/Output Summary (Last 24 hours) at 06/04/2021 0852 Last data filed at 06/04/2021 0658 Gross per 24 hour  Intake 2074.07 ml  Output 150 ml  Net 1924.07 ml   Filed Weights   06/02/21 1648 06/03/21 0400 06/04/21 0430  Weight: 89.5 kg 86.6 kg 87.8 kg    Examination:  General exam: Appears calm and comfortable  Respiratory system: Clear to auscultation. Respiratory effort normal. Cardiovascular system: S1 & S2 heard, RRR. No JVD, No pedal edema. Gastrointestinal system: Abdomen is nondistended, soft , tender in the lower quadrant.  Normal bowel sounds heard. Central nervous system: Alert and oriented. No focal neurological deficits. Extremities: bilateral lower extremity edema left  >right. Severe tenderness in the left leg. Pulses palpable.  Skin: No rashes, lesions or ulcers Psychiatry: Mood & affect appropriate.     Data Reviewed: I have personally reviewed following labs and imaging studies  CBC: Recent Labs  Lab 06/01/21 0125 06/02/21 0440 06/02/21 1610 06/03/21 0408 06/04/21 0248  WBC 18.8* 24.9* 28.0* 23.3* 21.9*  NEUTROABS  --   --   --   --  17.2*  HGB 8.8* 8.6* 8.7* 8.0* 8.2*  HCT 25.7* 25.2* 26.2* 24.4* 23.4*  MCV 81.1 81.3 83.2 83.6 81.3  PLT 293 412* 467* 457* 542*    Basic Metabolic Panel: Recent Labs  Lab 05/29/21 0253 05/30/21 0340 05/31/21 0402 06/01/21 0125 06/02/21 0440 06/02/21 1430 06/03/21 0408 06/04/21 0248  NA 137 132* 134* 133* 134* 132* 131* 131*  K 3.8 3.7 4.5 3.8 4.3 5.0 4.6 4.6  CL 103 99 98 99 99 96* 96* 95*  CO2 25 20* 16* 18* 15* 12* 16* 18*  GLUCOSE 115* 111* 113* 99 98 111* 103* 102*  BUN 55* 108* 163* 136* 149* 191* 130* 156*  CREATININE 4.23* 6.54* 8.59* 7.31* 10.51* 11.37* 8.78* 10.31*  CALCIUM 8.0* 7.7* 7.7* 7.7* 7.6* 8.5* 7.7* 7.5*  MG 2.9* 3.3* 3.4* 2.8*  --   --   --   --   PHOS 2.3* 4.0 6.1* 6.1* 9.9* >30.0* 11.6* >30.0*    GFR: Estimated Creatinine Clearance: 9.9 mL/min (A) (by C-G formula based on SCr of 10.31 mg/dL (H)).  Liver Function Tests: Recent Labs  Lab 06/02/21 0440 06/02/21 1430 06/02/21 1610 06/03/21 0408 06/04/21 0248  AST  --   --  77*  --  42*  ALT  --   --  173*  --  110*  ALKPHOS  --   --  158*  --  136*  BILITOT  --   --  0.6  --  0.4  PROT  --   --  5.0*  --  4.8*  ALBUMIN 1.9* 2.0* 1.9* 1.9* 1.8*    CBG: Recent Labs  Lab 05/30/21 2326 05/31/21 0353 05/31/21 0734 05/31/21 1547 05/31/21 2332  GLUCAP 103* 193* 137* 103* 105*     Recent Results (from the past 240 hour(s))  Gastrointestinal Panel by PCR , Stool     Status: None   Collection Time: 06/01/21  5:27 AM   Specimen: Stool  Result Value Ref Range Status   Campylobacter species NOT DETECTED NOT  DETECTED Final   Plesimonas shigelloides NOT DETECTED NOT DETECTED Final   Salmonella species NOT DETECTED NOT DETECTED Final   Yersinia enterocolitica NOT DETECTED NOT DETECTED Final   Vibrio species NOT DETECTED NOT DETECTED Final   Vibrio cholerae NOT DETECTED NOT DETECTED Final   Enteroaggregative E coli (EAEC) NOT DETECTED NOT DETECTED Final   Enteropathogenic E coli (EPEC) NOT DETECTED NOT DETECTED Final   Enterotoxigenic E coli (ETEC) NOT DETECTED NOT DETECTED Final   Shiga like toxin producing E coli (STEC) NOT DETECTED NOT DETECTED Final   Shigella/Enteroinvasive E coli (EIEC) NOT DETECTED NOT DETECTED Final   Cryptosporidium NOT DETECTED NOT DETECTED Final   Cyclospora cayetanensis NOT DETECTED NOT DETECTED Final   Entamoeba histolytica NOT DETECTED NOT DETECTED Final   Giardia lamblia NOT DETECTED NOT DETECTED Final   Adenovirus F40/41 NOT DETECTED NOT DETECTED Final   Astrovirus NOT DETECTED NOT DETECTED Final   Norovirus GI/GII NOT DETECTED NOT DETECTED Final   Rotavirus A NOT DETECTED NOT DETECTED Final   Sapovirus (I, II, IV, and V) NOT DETECTED NOT DETECTED Final    Comment: Performed at [facility], [address], [place], [place] [address]         Radiology Studies: MR FEMUR LEFT W WO CONTRAST  Result Date: 06/03/2021 CLINICAL DATA:  Left thigh swelling with concern for potential compartment syndrome causing femoral nerve deficit. Muscular swelling on CT suspicious for rhabdomyolysis. Soft tissue infection suspected. EXAM: MR OF THE LEFT LOWER EXTREMITY WITHOUT AND WITH CONTRAST TECHNIQUE: Multiplanar, multisequence MR imaging of the left thigh was performed both before and after administration of intravenous contrast. CONTRAST:  73mL GADAVIST GADOBUTROL 1 MMOL/ML IV SOLN COMPARISON:  Abdominopelvic CT 06/01/2021 and 05/30/2021. FINDINGS: Bones/Joint/Cartilage No evidence of acute fracture, dislocation or bone destruction. There is no abnormal osseous signal  or enhancement. There is no significant hip joint effusion. The knee joint is incompletely visualized. Ligaments Not relevant for exam/indication. Muscles and Tendons There is generalized T2 hyperintensity throughout the left thigh musculature which is greatest inferiorly in the left gluteus maximus muscle. The muscle is enlarged with heterogeneous T2 signal and heterogeneous  enhancement following contrast. There are irregular areas of decreased enhancement within the muscle which may reflect necrosis. No well-defined abscess demonstrated. Similar heterogeneous enhancement is present within the quadratus femoris muscle. Lesser enhancement is seen throughout the additional left thigh musculature. There is also some edema and enhancement within the visualized right thigh musculature. Soft tissues Moderate generalized subcutaneous edema throughout the left thigh without focal fluid collection or foreign body. No evidence of soft tissue emphysema. There is heterogeneous T2 hyperintensity and enhancement surrounding the sciatic nerve which appears normal in caliber and signal. No vascular abnormalities are apparent. Soft tissue edema is noted in the lower pelvis. IMPRESSION: 1. Diffuse muscular abnormalities throughout the visualized portions of both thighs with heterogeneous T2 signal and enhancement, especially within the left gluteus maximus and quadratus femoris muscles. Findings are compatible with the diagnosis of rhabdomyolysis, with the differential including myositis related to connective tissue disease and polymyositis. No drainable abscess identified. 2. Associated diffuse subcutaneous edema which is nonspecific and may reflect cellulitis. 3. These findings could contribute to compartment syndrome in the appropriate clinical context. This requires clinical evaluation to confirm/exclude. 4. No evidence of osteomyelitis or septic arthritis. Electronically Signed   By: Richardean Sale M.D.   On: 06/03/2021 09:52    DG CHEST PORT 1 VIEW  Result Date: 06/03/2021 CLINICAL DATA:  Cardiac arrest, chest soreness, pneumomediastinum. EXAM: PORTABLE CHEST 1 VIEW COMPARISON:  06/01/2021. FINDINGS: Right IJ central line tip is in the SVC. Heart size stable. Lungs are low in volume with platelike density in the right perihilar region. There may be additional subsegmental airspace opacification in the left lower lobe. No appreciable residual pneumomediastinum. IMPRESSION: 1. Low lung volumes with subsegmental atelectasis in the right perihilar region and left lower lobe. 2. No definite residual pneumomediastinum. Electronically Signed   By: Lorin Picket M.D.   On: 06/03/2021 08:47   VAS [place] LOWER EXTREMITY VENOUS (DVT)  Result Date: 06/02/2021  Lower Venous DVT Study Patient Name:  JAVEL CARRANCO [facility]  Date of Exam:   06/02/2021 Medical Rec #: [id number]          Accession #:    [id number] Date of Birth: Dec 30, 1975           Patient Gender: M Patient Age:   82 years Exam Location:  [facility] Procedure:      VAS [place] LOWER EXTREMITY VENOUS (DVT) Referring Phys: ANAND HONGALGI --------------------------------------------------------------------------------  Indications: Leukocytosis unclear etiol.  Comparison Study: no prior Performing Technologist: Archie Patten RVS  Examination Guidelines: A complete evaluation includes B-mode imaging, spectral Doppler, color Doppler, and power Doppler as needed of all accessible portions of each vessel. Bilateral testing is considered an integral part of a complete examination. Limited examinations for reoccurring indications may be performed as noted. The reflux portion of the exam is performed with the patient in reverse Trendelenburg.  +---------+---------------+---------+-----------+----------+--------------+  RIGHT     Compressibility Phasicity Spontaneity Properties Thrombus Aging  +---------+---------------+---------+-----------+----------+--------------+  CFV       Full             Yes       Yes                                    +---------+---------------+---------+-----------+----------+--------------+  SFJ       Full                                                             +---------+---------------+---------+-----------+----------+--------------+  FV Prox   Full                                                             +---------+---------------+---------+-----------+----------+--------------+  FV Mid    Full                                                             +---------+---------------+---------+-----------+----------+--------------+  FV Distal Full                                                             +---------+---------------+---------+-----------+----------+--------------+  PFV       Full                                                             +---------+---------------+---------+-----------+----------+--------------+  POP       Full            Yes       Yes                                    +---------+---------------+---------+-----------+----------+--------------+  PTV       Full                                                             +---------+---------------+---------+-----------+----------+--------------+  PERO      Full                                                             +---------+---------------+---------+-----------+----------+--------------+   +---------+---------------+---------+-----------+----------+--------------+  LEFT      Compressibility Phasicity Spontaneity Properties Thrombus Aging  +---------+---------------+---------+-----------+----------+--------------+  CFV       Full            Yes       Yes                                    +---------+---------------+---------+-----------+----------+--------------+  SFJ       Full                                                             +---------+---------------+---------+-----------+----------+--------------+  FV Prox   Full                                                              +---------+---------------+---------+-----------+----------+--------------+  FV Mid    Full                                                             +---------+---------------+---------+-----------+----------+--------------+  FV Distal Full                                                             +---------+---------------+---------+-----------+----------+--------------+  PFV       Full                                                             +---------+---------------+---------+-----------+----------+--------------+  POP       Full            Yes       Yes                                    +---------+---------------+---------+-----------+----------+--------------+  PTV       Full                                                             +---------+---------------+---------+-----------+----------+--------------+  PERO      Full                                                             +---------+---------------+---------+-----------+----------+--------------+     Summary: BILATERAL: - No evidence of deep vein thrombosis seen in the lower extremities, bilaterally. -No evidence of popliteal cyst, bilaterally.   *See table(s) above for measurements and observations. Electronically signed by Deitra Mayo MD on 06/02/2021 at 9:07:31 PM.    Final         Scheduled Meds:  Chlorhexidine Gluconate Cloth  6 each Topical Q0600   Chlorhexidine Gluconate Cloth  6 each Topical Q0600   darbepoetin (ARANESP) injection - NON-DIALYSIS  100 mcg Subcutaneous Q Mon-1800   feeding supplement  237 mL Oral TID BM   folic acid  1 mg Per Tube Daily   Gerhardt's butt cream   Topical TID   heparin injection (subcutaneous)  5,000  Units Subcutaneous Q8H   multivitamin  1 tablet Oral QHS   pantoprazole (PROTONIX) IV  40 mg Intravenous Q12H   Continuous Infusions:  sodium chloride Stopped (05/29/21 1602)   piperacillin-tazobactam (ZOSYN)  IV Stopped (06/04/21 0601)     LOS: 11 days       Hosie Poisson, MD [facility]   To contact the attending provider between 7A-7P or the covering provider during after hours 7P-7A, please log into the web site [email or web address] and access using universal [place] password for that web site. If you do not have the password, please call the hospital operator.  06/04/2021, 8:52 AM

## 2021-06-04 NOTE — Plan of Care (Signed)
  Problem: Education: Goal: Knowledge of General Education information will improve Description Including pain rating scale, medication(s)/side effects and non-pharmacologic comfort measures Outcome: Progressing   Problem: Health Behavior/Discharge Planning: Goal: Ability to manage health-related needs will improve Outcome: Progressing   

## 2021-06-05 ENCOUNTER — Telehealth: Payer: Self-pay | Admitting: Orthopaedic Surgery

## 2021-06-05 LAB — IRON AND TIBC
Iron: 23 ug/dL — ABNORMAL LOW (ref 45–182)
Saturation Ratios: 12 % — ABNORMAL LOW (ref 17.9–39.5)
TIBC: 197 ug/dL — ABNORMAL LOW (ref 250–450)
UIBC: 174 ug/dL

## 2021-06-05 LAB — RENAL FUNCTION PANEL
Albumin: 1.8 g/dL — ABNORMAL LOW (ref 3.5–5.0)
Anion gap: 14 (ref 5–15)
BUN: 75 mg/dL — ABNORMAL HIGH (ref 6–20)
CO2: 23 mmol/L (ref 22–32)
Calcium: 7.4 mg/dL — ABNORMAL LOW (ref 8.9–10.3)
Chloride: 95 mmol/L — ABNORMAL LOW (ref 98–111)
Creatinine, Ser: 6.67 mg/dL — ABNORMAL HIGH (ref 0.61–1.24)
GFR, Estimated: 10 mL/min — ABNORMAL LOW (ref >60)
Glucose, Bld: 105 mg/dL — ABNORMAL HIGH (ref 70–99)
Phosphorus: 9.3 mg/dL — ABNORMAL HIGH (ref 2.5–4.6)
Potassium: 4 mmol/L (ref 3.5–5.1)
Sodium: 132 mmol/L — ABNORMAL LOW (ref 135–145)

## 2021-06-05 LAB — TSH: TSH: 2.042 u[IU]/mL (ref 0.350–4.500)

## 2021-06-05 LAB — RETICULOCYTES
Immature Retic Fract: 36.1 % — ABNORMAL HIGH (ref 2.3–15.9)
RBC.: 2.89 MIL/uL — ABNORMAL LOW (ref 4.22–5.81)
Retic Count, Absolute: 83.5 10*3/uL (ref 19.0–186.0)
Retic Ct Pct: 2.9 % (ref 0.4–3.1)

## 2021-06-05 LAB — CK: Total CK: 462 U/L — ABNORMAL HIGH (ref 49–397)

## 2021-06-05 LAB — FOLATE: Folate: 23.1 ng/mL (ref >5.9)

## 2021-06-05 LAB — VITAMIN B12: Vitamin B-12: 2341 pg/mL — ABNORMAL HIGH (ref 180–914)

## 2021-06-05 LAB — FERRITIN: Ferritin: 276 ng/mL (ref 24–336)

## 2021-06-05 MED ORDER — PANTOPRAZOLE SODIUM 40 MG PO TBEC
40.0000 mg | DELAYED_RELEASE_TABLET | Freq: Two times a day (BID) | ORAL | Status: DC
  Administered 2021-06-05 – 2021-07-07 (×65): 40 mg via ORAL
  Filled 2021-06-05 (×65): qty 1

## 2021-06-05 MED ORDER — CEFAZOLIN SODIUM-DEXTROSE 1-4 GM/50ML-% IV SOLN
1.0000 g | INTRAVENOUS | Status: AC
  Administered 2021-06-05 – 2021-06-07 (×3): 1 g via INTRAVENOUS
  Filled 2021-06-05 (×3): qty 50

## 2021-06-05 MED ORDER — CHLORHEXIDINE GLUCONATE CLOTH 2 % EX PADS
6.0000 | MEDICATED_PAD | Freq: Every day | CUTANEOUS | Status: DC
  Administered 2021-06-06 – 2021-06-10 (×4): 6 via TOPICAL

## 2021-06-05 MED ORDER — GABAPENTIN 100 MG PO CAPS
100.0000 mg | ORAL_CAPSULE | Freq: Two times a day (BID) | ORAL | Status: DC
  Administered 2021-06-05 – 2021-06-15 (×20): 100 mg via ORAL
  Filled 2021-06-05 (×20): qty 1

## 2021-06-05 MED ORDER — LOPERAMIDE HCL 2 MG PO CAPS
2.0000 mg | ORAL_CAPSULE | Freq: Once | ORAL | Status: AC
  Administered 2021-06-05: 2 mg via ORAL
  Filled 2021-06-05: qty 1

## 2021-06-05 NOTE — Telephone Encounter (Signed)
Dr. Blake Divine at [facility] called on pt for a consult f/u. Having LLE severe pain and weakness, left foot drop and random dialysis. She said that Dr. Steward Drone saw this pt on 05/30/21 at hospital. I sent him a text to see if this was something he could f/u on. He replied "nothing at this standpoint, not a compartment syndrome." I texted Dr. Magnus Ivan whom is on call at hospital and he asked for [place] to contact Dr. Steward Drone to see if he will f/u with this again since Dr. Magnus Ivan is scrubbed in for surgery. I did text Dr. Steward Drone this info. FYI.

## 2021-06-05 NOTE — Progress Notes (Signed)
[facility] Progress Note   Subjective:   NO UOP recorded.   HD yest-   removed 2500 -  BP did drop a little -  he complained about the extra time and had some vomiting after-  he reports that he is making urine-  ? Incontinent ?   Objective Vitals:   06/04/21 1704 06/04/21 1715 06/04/21 2311 06/05/21 0248  BP: 110/86 115/80 125/85 132/80  Pulse: (!) 108 (!) 111 99 97  Resp: 20 18 18 20   Temp: (!) 97.2 F (36.2 C) 98.2 F (36.8 C) 98.8 F (37.1 C) 98.5 F (36.9 C)  TempSrc: Temporal Oral Oral Oral  SpO2: 99% 99%    Weight: 83.6 kg   86.7 kg  Height:       Physical Exam General: awake and alert-  spoke to him with interp  Heart: RRR, no rub Lungs: coarse BL Abdomen: soft, mildly distended Extremities: 1+ edema,  Neuro:  awake and alert, oriented to self and [place], cannot wiggle L toes or dorsiflex, R can move toes Dialysis Access:  R IJ  temp HD catheter -  placed 1/23  Additional Objective Labs: Basic Metabolic Panel: Recent Labs  Lab 06/03/21 0408 06/04/21 0248 06/05/21 0442  NA 131* 131* 132*  K 4.6 4.6 4.0  CL 96* 95* 95*  CO2 16* 18* 23  GLUCOSE 103* 102* 105*  BUN 130* 156* 75*  CREATININE 8.78* 10.31* 6.67*  CALCIUM 7.7* 7.5* 7.4*  PHOS 11.6* >30.0* 9.3*   Liver Function Tests: Recent Labs  Lab 06/02/21 1610 06/03/21 0408 06/04/21 0248 06/05/21 0442  AST 77*  --  42*  --   ALT 173*  --  110*  --   ALKPHOS 158*  --  136*  --   BILITOT 0.6  --  0.4  --   PROT 5.0*  --  4.8*  --   ALBUMIN 1.9* 1.9* 1.8* 1.8*   No results for input(s): LIPASE, AMYLASE in the last 168 hours. CBC: Recent Labs  Lab 06/01/21 0125 06/02/21 0440 06/02/21 1610 06/03/21 0408 06/04/21 0248  WBC 18.8* 24.9* 28.0* 23.3* 21.9*  NEUTROABS  --   --   --   --  17.2*  HGB 8.8* 8.6* 8.7* 8.0* 8.2*  HCT 25.7* 25.2* 26.2* 24.4* 23.4*  MCV 81.1 81.3 83.2 83.6 81.3  PLT 293 412* 467* 457* 542*   Blood Culture No results found for: SDES, SPECREQUEST, CULT,  REPTSTATUS  Cardiac Enzymes: Recent Labs  Lab 05/30/21 0340 06/02/21 1610 06/03/21 0408 06/04/21 0248  CKTOTAL 13,372* 2,119* 1,166* 783*   CBG: Recent Labs  Lab 05/30/21 2326 05/31/21 0353 05/31/21 0734 05/31/21 1547 05/31/21 2332  GLUCAP 103* 193* 137* 103* 105*   Iron Studies:  No results for input(s): IRON, TIBC, TRANSFERRIN, FERRITIN in the last 72 hours.  @lablastinr3 @ Studies/Results: MR FEMUR LEFT W WO CONTRAST  Result Date: 06/03/2021 CLINICAL DATA:  Left thigh swelling with concern for potential compartment syndrome causing femoral nerve deficit. Muscular swelling on CT suspicious for rhabdomyolysis. Soft tissue infection suspected. EXAM: MR OF THE LEFT LOWER EXTREMITY WITHOUT AND WITH CONTRAST TECHNIQUE: Multiplanar, multisequence MR imaging of the left thigh was performed both before and after administration of intravenous contrast. CONTRAST:  82mL GADAVIST GADOBUTROL 1 MMOL/ML IV SOLN COMPARISON:  Abdominopelvic CT 06/01/2021 and 05/30/2021. FINDINGS: Bones/Joint/Cartilage No evidence of acute fracture, dislocation or bone destruction. There is no abnormal osseous signal or enhancement. There is no significant hip joint effusion. The knee joint is  incompletely visualized. Ligaments Not relevant for exam/indication. Muscles and Tendons There is generalized T2 hyperintensity throughout the left thigh musculature which is greatest inferiorly in the left gluteus maximus muscle. The muscle is enlarged with heterogeneous T2 signal and heterogeneous enhancement following contrast. There are irregular areas of decreased enhancement within the muscle which may reflect necrosis. No well-defined abscess demonstrated. Similar heterogeneous enhancement is present within the quadratus femoris muscle. Lesser enhancement is seen throughout the additional left thigh musculature. There is also some edema and enhancement within the visualized right thigh musculature. Soft tissues Moderate  generalized subcutaneous edema throughout the left thigh without focal fluid collection or foreign body. No evidence of soft tissue emphysema. There is heterogeneous T2 hyperintensity and enhancement surrounding the sciatic nerve which appears normal in caliber and signal. No vascular abnormalities are apparent. Soft tissue edema is noted in the lower pelvis. IMPRESSION: 1. Diffuse muscular abnormalities throughout the visualized portions of both thighs with heterogeneous T2 signal and enhancement, especially within the left gluteus maximus and quadratus femoris muscles. Findings are compatible with the diagnosis of rhabdomyolysis, with the differential including myositis related to connective tissue disease and polymyositis. No drainable abscess identified. 2. Associated diffuse subcutaneous edema which is nonspecific and may reflect cellulitis. 3. These findings could contribute to compartment syndrome in the appropriate clinical context. This requires clinical evaluation to confirm/exclude. 4. No evidence of osteomyelitis or septic arthritis. Electronically Signed   By: Richardean Sale M.D.   On: 06/03/2021 09:52   Medications:  sodium chloride Stopped (05/29/21 1602)    ceFAZolin (ANCEF) IV      Chlorhexidine Gluconate Cloth  6 each Topical Q0600   Chlorhexidine Gluconate Cloth  6 each Topical Q0600   darbepoetin (ARANESP) injection - NON-DIALYSIS  100 mcg Subcutaneous Q Mon-1800   feeding supplement  237 mL Oral TID BM   folic acid  1 mg Per Tube Daily   Gerhardt's butt cream   Topical TID   heparin injection (subcutaneous)  5,000 Units Subcutaneous Q8H   multivitamin  1 tablet Oral QHS   pantoprazole (PROTONIX) IV  40 mg Intravenous Q12H    Assessment/ Plan: AKI severe: in setting of cardiac arrest, rhabdo.  Oliguric to anuric.  Required CRRT 1/15 - 1/19.   HD 1/22 ,  1/24  and 1/26 - has new IJ line-  BUN still very high-  catabolic-  some UOP recorded -  very high BUN and no significant  signs of renal recovery-  plan for likely another HD treatment tomorrow but will check labs first since he tells me that he is making urine-  then follow for signs of recovery-  still relatively hopeful for that  SP cardiac arrest - asystolic arrest in setting of prob asp PNA/ ARDS in setting of EtOH Severe metabolic/ resp acidosis -     overall imroving with HD AHRF - suspected aspiration pneumonitis w/ ARDS, on antibiotics per primary.  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

## 2021-06-05 NOTE — Progress Notes (Signed)
Speech Language Pathology Treatment: Dysphagia  Patient Details Name: Andres Braun MRN: [id number] DOB: 22-Aug-1975 Today's Date: 06/05/2021 Time: 4315-4008 SLP Time Calculation (min) (ACUTE ONLY): 8 min  Assessment / Plan / Recommendation Clinical Impression  Pt is doing well with his current soft diet, thin liquids.  There are no further concerns for dysphagia, no s/s of aspiration per observation this am.  Last FEES showed reliable airway protection. Diet is being advanced per surgery.  NO SLP f/u is needed - dysphagia is resolved. Our service will sign off.   HPI HPI: 46 year old man admitted after cardiac arrest at home. PEA arrest, s/p CPR x 20 minutes and Epi x 5. Found by friends with agonal breathing.  Lost pulse. ETT 1/15-19. Remains encephalopathic. Head CT 1/15 with no acute findings.  CXT 1/19: "New patchy right lower lung peribronchial opacity, favor atelectasis" Ex lap on 1/24 did not show ischemic bowel needing resection but with some edema in bowel still.  Pt's diet slowly advanced from clears to soft.      SLP Plan  All goals met      Recommendations for follow up therapy are one component of a multi-disciplinary discharge planning process, led by the attending physician.  Recommendations may be updated based on patient status, additional functional criteria and insurance authorization.    Recommendations  Diet recommendations: Dysphagia 3 (mechanical soft);Thin liquid Liquids provided via: Cup;Straw Medication Administration: Whole meds with liquid Supervision: Patient able to self feed                Oral Care Recommendations: Oral care BID Follow Up Recommendations: No SLP follow up SLP Visit Diagnosis: Dysphagia, unspecified (R13.10) Plan: All goals met          Andres Braun Andres Braun, [place] Office number [phone] Pager [phone]  Andres Braun  06/05/2021, 10:14 AM

## 2021-06-05 NOTE — Progress Notes (Signed)
Mobility Specialist Progress Note    06/05/21 1339  Mobility  Activity Transferred from chair to bed  Level of Assistance +2 (takes two people)  Assistive Device Stedy  Activity Response Tolerated fair  $Mobility charge 1 Mobility   Pt received in chair and ready to go back to bed. Loudly groaned in pain. Left with NT present.   [facility] Mobility Specialist  M.S. 2C and 6E: [phone] M.S. 4E: [phone]

## 2021-06-05 NOTE — Progress Notes (Signed)
Occupational Therapy Treatment Patient Details Name: Andres Braun MRN: [id number] DOB: November 20, 1975 Today's Date: 06/05/2021   History of present illness Pt is a 46 y.o. male admitted 05/24/21 after cardiac arrest at home; pt with agonal breathing, lost pulse, required 20-min CPR and 5 rounds epi. Workup for acute hypoxic respiratory failure, septic shock due to undertermined organism, AKI, acute metabolic encephalopathy, aspiration PNA of bilateral lower lobes due to vomit. ETT 1/15-1/19. CRRT 1/15-1/20. Brain MRI 1/22 negative. Cervical MRI 1/22 with small R-side disc protrusion C5-6. Abdominal CT showed muscular swelling, particularly iliopsoas and gluteal muscles consistent with rhabdomyolysis; hemorrhage noted within R iliopsoas. S/p diagnostic laparoscopy 1/24. MRI L femur 1/25 showed diffuse muscular abnormalities bilateral thighs with heterogenous T2 signal, especially within L glut max and gradratus femoris; findings compatible with dx of rhabdomyolysis, no drainable abscess identified; these findings could contribute to compartment syndrome. Unknown PMH.   OT comments  Pt progressed with supine BIL LE activation with max multi modal cueing. Pt reports "burning" pains in LLE with tactile input and weight bearing. Pt with increased cueing to initiate and can activate. Pt will verbalize "I can't" due to pain. Pt progressing with therapy and continues to benefit from CIR .   Recommendations for follow up therapy are one component of a multi-disciplinary discharge planning process, led by the attending physician.  Recommendations may be updated based on patient status, additional functional criteria and insurance authorization.    Follow Up Recommendations  Acute inpatient rehab (3hours/day)    Assistance Recommended at Discharge Frequent or constant Supervision/Assistance  Patient can return home with the following  Two people to help with walking and/or transfers;Two people to help with  bathing/dressing/bathroom;Assistance with cooking/housework;Assist for transportation;Direct supervision/assist for medications management;Help with stairs or ramp for entrance   Equipment Recommendations  BSC/3in1;Wheelchair (measurements OT);Wheelchair cushion (measurements OT)    Recommendations for Other Services Rehab consult    Precautions / Restrictions Precautions Precautions: Fall Restrictions Weight Bearing Restrictions: No       Mobility Bed Mobility Overal bed mobility: Needs Assistance Bed Mobility: Rolling, Sidelying to Sit Rolling: Mod assist, +2 for safety/equipment Sidelying to sit: Mod assist, +2 for safety/equipment, HOB elevated       General bed mobility comments: ModA to roll to R, + 2 for safety as he can be quick and impulsive with his movements, but better control today as was cued to do so slower. ModA to manage legs off EOB and push up on elbow > hand to ascend trunk R EOB, +2 for safety as pt was impulsive to scoot to L and anteriorly, needing blocking to prevent from scooting off bed.    Transfers Overall transfer level: Needs assistance Equipment used: Ambulation equipment used Transfers: Bed to chair/wheelchair/BSC, Sit to/from Stand Sit to Stand: +2 physical assistance, +2 safety/equipment, Min assist, From elevated surface           General transfer comment: Pt able to scoot R foot laterally for improved placement on stedy platform, but no initiation by L leg. Sit to stand 1x from elevated EOB and from stedy flaps > 4x with minAx2 to power up and steady. Verbal, tactile, and visual cues provided to extend hips, bring chest superior, and bring knees off knee support of stedy. Improved command following with subsequent reps, but pt still avoiding weight on L leg, displaying a relaxed L flexed knee with stance, even when being pulled over onto L leg physically. No quads activation noted with palpation when standing on  L leg, despite cues. Transfer  via Lift Equipment: Stedy   Balance Overall balance assessment: Needs assistance Sitting-balance support: Bilateral upper extremity supported, Feet supported, Single extremity supported Sitting balance-Leahy Scale: Poor Sitting balance - Comments: heavy reliance on UE support to maintain static sitting EOB, cued pt to place L hand on end of bed rail to try to facilitate weight shift to L hip as was avoiding and leaning to R but no success. Min guard-minA to sit EOB statically Postural control: Right lateral lean Standing balance support: Bilateral upper extremity supported, Reliant on assistive device for balance, During functional activity Standing balance-Leahy Scale: Poor Standing balance comment: Requires Bil UE support and min-modAx2 to stand >5x in stedy. Pt able to correct posture with max cues and bring legs off stedy knee rests, but continued to avoid L leg weight bearing or L quads activation even when pulled towards his L.                           ADL either performed or assessed with clinical judgement   ADL Overall ADL's : Needs assistance/impaired Eating/Feeding: Supervision/ safety Eating/Feeding Details (indicate cue type and reason): initially reports he doesnt want to eat but with redirection attempting rice and engaged in self feeding. pt requesting broth and given chicken broth off the unit Grooming: Minimal assistance   Upper Body Bathing: Moderate assistance   Lower Body Bathing: Total assistance       Lower Body Dressing: Total assistance                 General ADL Comments: pt progressed from supine to chair with chair alarm posey present for fall prevention    Extremity/Trunk Assessment     Lower Extremity Assessment RLE Deficits / Details: dressing over knee, activation of internal hip rotators LLE Deficits / Details: demonstrates activation of ankle knee and hip. Pt able to activate internal rotators at the hip though very weak         Vision       Perception     Praxis      Cognition Arousal/Alertness: Awake/alert Behavior During Therapy: Restless Overall Cognitive Status: No family/caregiver present to determine baseline cognitive functioning Area of Impairment: Orientation, Following commands, Memory, Safety/judgement, Awareness                 Orientation Level: Disoriented to, Time, Situation Current Attention Level: Sustained Memory: Decreased recall of precautions, Decreased short-term memory   Safety/Judgement: Decreased awareness of safety, Decreased awareness of deficits Awareness: Intellectual Problem Solving: Slow processing General Comments: Pt with poor safety awareness and demonstrates this by attempting to clap and letting go of the stedy with immediate LOB. Pt reports "i can't " and when pushed further by therapist demonstrates advancements        Exercises Exercises: Other exercises Other Exercises Other Exercises: pt requires visual auditory and tactile input for muscle activation of hip abduction/ adduction/ internal rotation hip flexion knee flexion/ extension and ankle inversion supine Other Exercises: AAROM bil hip activation in all planes supine x20 reps in 10 set reps Other Exercises: AROM bip internal rotation >15 reps supine Other Exercises: pt sit<>Stand >5 attempts    Shoulder Instructions       General Comments Graciela present    Pertinent Vitals/ Pain       Pain Assessment Pain Assessment: 0-10 Pain Score: 10-Worst pain ever Pain Location: BLEs (L>R) Pain Descriptors / Indicators: Burning, Numbness,  Moaning Pain Intervention(s): Limited activity within patient's tolerance  Home Living                                          Prior Functioning/Environment              Frequency  Min 2X/week        Progress Toward Goals  OT Goals(current goals can now be found in the care plan section)  Progress towards OT goals: Progressing  toward goals  Acute Rehab OT Goals Patient Stated Goal: reports burning L LE OT Goal Formulation: With patient Time For Goal Achievement: 06/12/21 Potential to Achieve Goals: Good ADL Goals Pt Will Perform Grooming: with set-up;with supervision;sitting Pt Will Perform Upper Body Bathing: with set-up;with supervision;sitting Pt Will Perform Lower Body Bathing: with min assist;sit to/from stand Pt Will Transfer to Toilet: with min assist;stand pivot transfer;bedside commode Pt Will Perform Toileting - Clothing Manipulation and hygiene:  (will be able to stand with min (A) and (A) with these tasks) Additional ADL Goal #1: Pt will be Min A in and OOB for basic ADLs Additional ADL Goal #2: continue to assess vision Additional ADL Goal #3: Pt will follow 75% of one step commands on first try  Plan Discharge plan remains appropriate    Co-evaluation    PT/OT/SLP Co-Evaluation/Treatment: Yes Reason for Co-Treatment: Complexity of the patient's impairments (multi-system involvement);Necessary to address cognition/behavior during functional activity;For patient/therapist safety;To address functional/ADL transfers PT goals addressed during session: Mobility/safety with mobility;Balance;Proper use of DME;Strengthening/ROM OT goals addressed during session: ADL's and self-care;Proper use of Adaptive equipment and DME;Strengthening/ROM      AM-PAC OT "6 Clicks" Daily Activity     Outcome Measure   Help from another person eating meals?: A Little Help from another person taking care of personal grooming?: A Little Help from another person toileting, which includes using toliet, bedpan, or urinal?: A Lot Help from another person bathing (including washing, rinsing, drying)?: A Lot Help from another person to put on and taking off regular upper body clothing?: A Little Help from another person to put on and taking off regular lower body clothing?: Total 6 Click Score: 14    End of Session  Equipment Utilized During Treatment: Gait belt  OT Visit Diagnosis: Unsteadiness on feet (R26.81);Other abnormalities of gait and mobility (R26.89);Muscle weakness (generalized) (M62.81);Other symptoms and signs involving cognitive function;Pain Pain - Right/Left: Left Pain - part of body: Leg   Activity Tolerance Patient tolerated treatment well   Patient Left in bed;with call bell/phone within reach;with bed alarm set;with nursing/sitter in room   Nurse Communication Mobility status;Precautions        Time: 1100-1140 OT Time Calculation (min): 40 min  Charges: OT General Charges $OT Visit: 1 Visit OT Treatments $Therapeutic Activity: 8-22 mins   Brynn, OTR/L  Acute Rehabilitation Services Pager: [phone] Office: [phone] .   Mateo Flow 06/05/2021, 3:07 PM

## 2021-06-05 NOTE — Progress Notes (Signed)
ORTHOPAEDIC CONSULTATION  REQUESTING PHYSICIAN: Hosie Poisson, MD  Chief Complaint: continues left leg weakness  Interval history: Patient taken to OR with general surgery for ischemic bowel. Continues to have diffuse LLE weakness. MRI consistent for rhabdo involving the left gluteus.   HPI: Andres Braun is a 46 y.o. male who presents with status post being found down who underwent multiple cycles of CPR.  He now is postextubation and is experiencing encephalopathy although mental status is clear today.  He is stating that both of his legs feel numb from the thighs down and he feels like there is a loss of blood to them.  He is endorsing weakness particularly about the left foot although the right foot remains strong.  He is currently being worked up in the ICU for multiple sources of ischemia.   History reviewed. No pertinent past medical history. History reviewed. No pertinent surgical history. Social History    History reviewed. No pertinent past medical history. Past Surgical History:  Procedure Laterality Date   INSERTION OF DIALYSIS CATHETER Right 06/01/2021   Procedure: INSERTION OF DIALYSIS CATHETER;  Surgeon: Candee Furbish, MD;  Location: [facility] ENDOSCOPY;  Service: Pulmonary;  Laterality: Right;   LAPAROSCOPY N/A 06/02/2021   Procedure: LAPAROSCOPY DIAGNOSTIC;  Surgeon: Rolm Bookbinder, MD;  Location: [facility];  Service: General;  Laterality: N/A;   Social History   Socioeconomic History   Marital status: Unknown    Spouse name: Not on file   Number of children: Not on file   Years of education: Not on file   Highest education level: Not on file  Occupational History   Not on file  Tobacco Use   Smoking status: Unknown   Smokeless tobacco: Not on file   Tobacco comments:    This is an Urgent case - to OR  Vaping Use   Vaping Use: Not on file  Substance and Sexual Activity   Alcohol use: Yes   Drug use: Not Currently    Comment: pt denies   Sexual activity:  Not on file  Other Topics Concern   Not on file  Social History Narrative   Pt separated from significant other 5-6 years ago ([place]). She says she is the only contact that he has here though   Social Determinants of Health   Financial Resource Strain: Not on file  Food Insecurity: Not on file  Transportation Needs: Not on file  Physical Activity: Not on file  Stress: Not on file  Social Connections: Not on file   History reviewed. No pertinent family history. - negative except otherwise stated in the family history section No Known Allergies Prior to Admission medications   Not on File   No results found.   Positive ROS: All other systems have been reviewed and were otherwise negative with the exception of those mentioned in the HPI and as above.  Physical Exam: On sensory exam patient states that from the thigh down decreased sensation on the left leg.  All of his compartments of the buttocks thigh and lower legs are compressible bilaterally without concern for compartment syndrome.  There is no concern for any type of compartments in bilateral lower extremities.  He does have a pressure wound involving the anterior aspect of the right knee.  This overall appears to be relatively low-grade and is well dressed.  There is edema surround the gluteus maximum.  He lacks knee extension and flexion on left side.  He lacks dorsal and plantar flexion of  the left foot.   Palpable 2+ dorsalis pedis pulse bilaterally   Assessment: 46 year old male with rhabdomyolysis of the gluteus compartment.  There is no concern for compartment syndrome at this time involving the left gluteus as well as the left thigh or lower leg.  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  Would recommend neurology consult for further management of this.  There is no indication for orthopedic intervention  at this time.  Thank you for the consult and the opportunity to see Mr. Exander Waldera  Vanetta Mulders, MD [facility] 3:28 PM

## 2021-06-05 NOTE — Progress Notes (Signed)
Physical Therapy Treatment Patient Details Name: Lexie Morini MRN: [id number] DOB: June 13, 1975 Today's Date: 06/05/2021   History of Present Illness Pt is a 46 y.o. male admitted 05/24/21 after cardiac arrest at home; pt with agonal breathing, lost pulse, required 20-min CPR and 5 rounds epi. Workup for acute hypoxic respiratory failure, septic shock due to undertermined organism, AKI, acute metabolic encephalopathy, aspiration PNA of bilateral lower lobes due to vomit. ETT 1/15-1/19. CRRT 1/15-1/20. Brain MRI 1/22 negative. Cervical MRI 1/22 with small R-side disc protrusion C5-6. Abdominal CT showed muscular swelling, particularly iliopsoas and gluteal muscles consistent with rhabdomyolysis; hemorrhage noted within R iliopsoas. S/p diagnostic laparoscopy 1/24. MRI L femur 1/25 showed diffuse muscular abnormalities bilateral thighs with heterogenous T2 signal, especially within L glut max and gradratus femoris; findings compatible with dx of rhabdomyolysis, no drainable abscess identified; these findings could contribute to compartment syndrome. Unknown PMH.    PT Comments    Treated pt in conjunction with OT to maximize quality and safety of session. Pt was able to display muscle activation in bil lower extremities today. He is displaying improved AROM of R hip into adduction and internal rotation and AAROM of L hip into abduction/adduction and internal rotation supine in bed. He is also displaying R quads and glut activation when cued supine in bed and with weight bearing standing in stedy. However, he continues to display poor anterior and posterior musculature activation in his L leg, only displaying x2 brief moments of quads activating supine in bed. Did not note muscle activation in weight bearing with pt avoiding weight on his L leg due to numb/burning pain. Pt still with cognitive deficits and impulsive tendencies placing him at high risk for falls, needing modA for bed mobility and minAx2 for  transfers to stand using the stedy, +2 at all times for safety. Will continue to follow acutely. Current recommendations remain appropriate.    Recommendations for follow up therapy are one component of a multi-disciplinary discharge planning process, led by the attending physician.  Recommendations may be updated based on patient status, additional functional criteria and insurance authorization.  Follow Up Recommendations  Acute inpatient rehab (3hours/day)     Assistance Recommended at Discharge Frequent or constant Supervision/Assistance  Patient can return home with the following Two people to help with walking and/or transfers;Help with stairs or ramp for entrance;A lot of help with bathing/dressing/bathroom;Assistance with cooking/housework;Assist for transportation;Direct supervision/assist for financial management;Direct supervision/assist for medications management   Equipment Recommendations  Wheelchair (measurements PT);Wheelchair cushion (measurements PT);Other (comment);Hospital bed;BSC/3in1 (stedy)    Recommendations for Other Services       Precautions / Restrictions Precautions Precautions: Fall Restrictions Weight Bearing Restrictions: No     Mobility  Bed Mobility Overal bed mobility: Needs Assistance Bed Mobility: Rolling, Sidelying to Sit Rolling: Mod assist, +2 for safety/equipment Sidelying to sit: Mod assist, +2 for safety/equipment, HOB elevated       General bed mobility comments: ModA to roll to R, + 2 for safety as he can be quick and impulsive with his movements, but better control today as was cued to do so slower. ModA to manage legs off EOB and push up on elbow > hand to ascend trunk R EOB, +2 for safety as pt was impulsive to scoot to L and anteriorly, needing blocking to prevent from scooting off bed.    Transfers Overall transfer level: Needs assistance Equipment used: Ambulation equipment used Transfers: Bed to chair/wheelchair/BSC, Sit  to/from Stand Sit to Stand: +2 physical  assistance, +2 safety/equipment, Min assist, From elevated surface           General transfer comment: Pt able to scoot R foot laterally for improved placement on stedy platform, but no initiation by L leg. Sit to stand 1x from elevated EOB and from stedy flaps > 4x with minAx2 to power up and steady. Verbal, tactile, and visual cues provided to extend hips, bring chest superior, and bring knees off knee support of stedy. Improved command following with subsequent reps, but pt still avoiding weight on L leg, displaying a relaxed L flexed knee with stance, even when being pulled over onto L leg physically. No quads activation noted with palpation when standing on L leg, despite cues. Transfer via Lift Equipment: Stedy  Ambulation/Gait               General Gait Details: Company secretary Rankin (Stroke Patients Only) Modified Rankin (Stroke Patients Only) Pre-Morbid Rankin Score: No symptoms Modified Rankin: Severe disability     Balance Overall balance assessment: Needs assistance Sitting-balance support: Bilateral upper extremity supported, Feet supported, Single extremity supported Sitting balance-Leahy Scale: Poor Sitting balance - Comments: heavy reliance on UE support to maintain static sitting EOB, cued pt to place L hand on end of bed rail to try to facilitate weight shift to L hip as was avoiding and leaning to R but no success. Min guard-minA to sit EOB statically Postural control: Right lateral lean Standing balance support: Bilateral upper extremity supported, Reliant on assistive device for balance, During functional activity Standing balance-Leahy Scale: Poor Standing balance comment: Requires Bil UE support and min-modAx2 to stand >5x in stedy. Pt able to correct posture with max cues and bring legs off stedy knee rests, but continued to avoid L leg weight bearing or L quads  activation even when pulled towards his L.                            Cognition Arousal/Alertness: Awake/alert Behavior During Therapy: Impulsive, Agitated Overall Cognitive Status: No family/caregiver present to determine baseline cognitive functioning Area of Impairment: Following commands, Attention, Safety/judgement, Awareness, Problem solving, Memory                   Current Attention Level: Sustained Memory: Decreased recall of precautions, Decreased short-term memory Following Commands: Follows one step commands inconsistently, Follows one step commands with increased time Safety/Judgement: Decreased awareness of safety, Decreased awareness of deficits Awareness: Intellectual Problem Solving: Slow processing, Difficulty sequencing, Decreased initiation, Requires verbal cues, Requires tactile cues General Comments: Pt qith quick, impulsive movements and poor safety awareness, releasing stedy to clap his hands together when he was havign difficulty balancing even with +2 assistance. Needs cues to pause or slow down when scooting at EOB as he kept scooting closer to the edge with poor control. Pt repeating that he is unable to move his legs, even though he had moved them > 10x each in particular directions, poor awareness of his capabilities. Needs max cues and re-wording of cues to get pt to activate his lower extremity musculature, benefiting from visual targets to direct his legs to. Almost have to agitate pt through repeatedly asking him to perform more exercises for him to actually activate his legs.        Exercises Other Exercises Other Exercises: Tapping of L musculature  when cuing pt to activate with hip ab/adduction, hip flexion, knee flexion and knee extension; noted quads activation 2x briefly while supine in bed Other Exercises: AAROM bil hip abduction/adduction supine in bed, >20x each, less assistance at R leg with moments of AROM on R Other Exercises:  AROM for bil hip internal rotation >15 x each, supine in bed Other Exercises: R leg quad sets with cues    General Comments General comments (skin integrity, edema, etc.): Graciela present for in-person interpretation throughout session      Pertinent Vitals/Pain Pain Assessment Pain Assessment: 0-10 Pain Score: 10-Worst pain ever Pain Location: BLEs (L>R) Pain Descriptors / Indicators: Burning, Numbness, Moaning Pain Intervention(s): Limited activity within patient's tolerance, Monitored during session, Repositioned    Home Living                          Prior Function            PT Goals (current goals can now be found in the care plan section) Acute Rehab PT Goals Patient Stated Goal: for his L leg not to hurt PT Goal Formulation: With patient Time For Goal Achievement: 06/12/21 Potential to Achieve Goals: Fair Progress towards PT goals: Progressing toward goals    Frequency    Min 3X/week      PT Plan Equipment recommendations need to be updated    Co-evaluation PT/OT/SLP Co-Evaluation/Treatment: Yes Reason for Co-Treatment: Complexity of the patient's impairments (multi-system involvement);Necessary to address cognition/behavior during functional activity;For patient/therapist safety;To address functional/ADL transfers PT goals addressed during session: Mobility/safety with mobility;Balance;Proper use of DME;Strengthening/ROM        AM-PAC PT "6 Clicks" Mobility   Outcome Measure  Help needed turning from your back to your side while in a flat bed without using bedrails?: A Lot Help needed moving from lying on your back to sitting on the side of a flat bed without using bedrails?: A Lot Help needed moving to and from a bed to a chair (including a wheelchair)?: Total Help needed standing up from a chair using your arms (e.g., wheelchair or bedside chair)?: Total Help needed to walk in hospital room?: Total Help needed climbing 3-5 steps with a  railing? : Total 6 Click Score: 8    End of Session Equipment Utilized During Treatment: Gait belt Activity Tolerance: Patient tolerated treatment well Patient left: in chair;with call bell/phone within reach;Other (comment) (posey alarm belt in place and on) Nurse Communication: Mobility status;Other (comment) (posey alarm belt on and in place) PT Visit Diagnosis: Other abnormalities of gait and mobility (R26.89);Muscle weakness (generalized) (M62.81);Difficulty in walking, not elsewhere classified (R26.2);Unsteadiness on feet (R26.81)     Time: [id number] PT Time Calculation (min) (ACUTE ONLY): 38 min  Charges:  $Therapeutic Activity: 8-22 mins $Neuromuscular Re-education: 8-22 mins                     Raymond GurneyAnessa Pettis, PT, DPT Acute Rehabilitation Services  Pager: [phone] Office: [phone]    Jewel Baizenessa M Pettis 06/05/2021, 1:49 PM

## 2021-06-05 NOTE — Progress Notes (Signed)
PROGRESS NOTE    Andres Braun  [id number] DOB: Dec 29, 1975 DOA: 05/24/2021 PCP: Merryl Hacker, No   Chief Complaint  Patient presents with   Cardiac Arrest    Brief Narrative:   46 year old male admitted to ICU 1/15 under PCCM care after cardiac arrest at home.  PEA arrest, s/p CPR x20 minutes and epi x5.  Patient reportedly out drinking with his friends the night prior to admission.  Found by friends with agonal breathing.  Intubated on arrival, needed vasopressors, noted to have renal failure, rhabdomyolysis with CK in the 14 K range, treated for aspiration pneumonia, nephrology consulted and started on CRRT.  Extubated 1/19 and off pressors.  Mental status slowly improved.  Transferred to telemetry.  Care transferred to [facility] on 1/24.  Nephrology following for HD needs.  General surgery on board for questionable small bowel ischemia and taken emergently to surgery 1/24. The bowel in question was found to be thickened, but viable.    Assessment & Plan:   Principal Problem:   Cardiac arrest with pulseless electrical activity ([place]) Active Problems:   Acute respiratory failure with hypoxia and hypercapnia ([facility])   Septic shock due to undetermined organism ([place])   AKI (acute kidney injury) ([place])   Acute metabolic encephalopathy   Aspiration pneumonia of both lower lobes due to vomit ([facility])   Protein-calorie malnutrition, severe   Acute hypoxic respiratory failure/aspiration pneumonia Patient intubated on 05/24/2021 and extubated on 05/28/2021 patient currently on room air. Patient completed the course of antibiotics by 20/19/23.    Ischemic enteritis versus colitis with ileus General surgery consulted, there was concerned about an ischemic bowel.  Patient was taken to the OR for diagnostic laparoscopy, surgery revealed bowel in question is thickened,  no ischemia was seen.  hence no further intervention at this time.  Advance diet as tolerated without any issues at this time. Pt continues  to have some lower abd pain and watery diarrhea, . Continue to monitor.     Lower extremity weakness wit left foot drop:  -MRI of the neuraxis  was unremarkable for acute process .  MRI of the femur shows Diffuse muscular abnormalities throughout the visualized portions of both thighs with heterogeneous T2 signal and enhancement, especially within the left gluteus maximus and quadratus femoris muscles. Findings are compatible with the diagnosis of rhabdomyolysis, with the differential including myositis related to connective tissue disease and polymyositis. No drainable abscess identified. pt unable to move the left lower extremity from severe pain. CK levels are improving.   Pt has good pulse in DP, no sensory deficits. He reports more pain in the left thigh compared to yesterday. No signs of compartment syndrome. Will request orthopedics to see if pt has muscle necrosis, to see if he needs surgical exploration.  Therapy evaluations recommending CIR.     Acute metabolic encephalopathy Improved. He is alert and answering questions appropriately.    Right anterior knee wound:  -wound care consult.    Acute kidney injury with metabolic acidosis/rhabdomyolysis Patient has been off CRRT as of 05/28/2021. Nephrology on board and appreciate recommendations.  Improving CK levels.   Hyponatremia -Secondary to CKD.   Severe protein calorie malnutrition. Supplementation added. Albumin around .1.8  Mechanical fall 1/24 and hit his head -As per nursing report, prior to going to the OR, patient attempted to get out of the bed, fell and hit his head without any obvious external injuries or LOC.  Patient's friend was in the room. Advised nursing regarding strict fall  precautions, placement of safety sitter if needed and monitor closely.    Anemia of chronic disease in the setting of colitis Transfuse  to keep hemoglobin greater than 7. Hemoglobin stable around 8.   Leukocytosis:   Suspect from   DVT prophylaxis: Heparin.  Code Status: (Full Code) Family Communication: none at bedside.  Disposition:   Status is: Inpatient  Remains inpatient appropriate because: unsafe d/c plan.        Consultants:  Nephrology Surgery.   Procedures: none.    Antimicrobials: Antibiotics Given (last 72 hours)     Date/Time Action Medication Dose Rate   06/02/21 1339 Given   piperacillin-tazobactam (ZOSYN) IVPB 2.25 g 2.25 g    06/02/21 2220 New Bag/Given   piperacillin-tazobactam (ZOSYN) IVPB 2.25 g 2.25 g 100 mL/hr   06/03/21 0509 New Bag/Given   piperacillin-tazobactam (ZOSYN) IVPB 2.25 g 2.25 g 100 mL/hr   06/03/21 1453 New Bag/Given   piperacillin-tazobactam (ZOSYN) IVPB 2.25 g 2.25 g 100 mL/hr   06/03/21 2127 New Bag/Given   piperacillin-tazobactam (ZOSYN) IVPB 2.25 g 2.25 g 100 mL/hr   06/04/21 0525 New Bag/Given   piperacillin-tazobactam (ZOSYN) IVPB 2.25 g 2.25 g 100 mL/hr   06/04/21 1842 New Bag/Given   piperacillin-tazobactam (ZOSYN) IVPB 2.25 g 2.25 g 100 mL/hr   06/04/21 2323 New Bag/Given   piperacillin-tazobactam (ZOSYN) IVPB 2.25 g 2.25 g 100 mL/hr   06/05/21 0636 New Bag/Given   piperacillin-tazobactam (ZOSYN) IVPB 2.25 g 2.25 g 100 mL/hr         Subjective: No chest pain or sob. Worsening pain in the left thigh.   Objective: Vitals:   06/04/21 1704 06/04/21 1715 06/04/21 2311 06/05/21 0248  BP: 110/86 115/80 125/85 132/80  Pulse: (!) 108 (!) 111 99 97  Resp: 20 18 18 20   Temp: (!) 97.2 F (36.2 C) 98.2 F (36.8 C) 98.8 F (37.1 C) 98.5 F (36.9 C)  TempSrc: Temporal Oral Oral Oral  SpO2: 99% 99%    Weight: 83.6 kg   86.7 kg  Height:        Intake/Output Summary (Last 24 hours) at 06/05/2021 0942 Last data filed at 06/04/2021 2323 Gross per 24 hour  Intake 50 ml  Output 2516 ml  Net -2466 ml    Filed Weights   06/04/21 1250 06/04/21 1704 06/05/21 0248  Weight: 86.1 kg 83.6 kg 86.7 kg    Examination:  General exam:  Appears calm and comfortable  Respiratory system: Clear to auscultation. Respiratory effort normal. Cardiovascular system: S1 & S2 heard, RRR. No JVD,  pedal edema present.  Gastrointestinal system: Abdomen is soft mildly tenderness in the lower quadrant.  Central nervous system: Alert and oriented. No focal neurological deficits. Extremities: left lower extremity tenderness present, more in the thigh than leg. Bilateral leg edema left > right.  Skin: No rashes, Psychiatry: anxious and angry.      Data Reviewed: I have personally reviewed following labs and imaging studies  CBC: Recent Labs  Lab 06/01/21 0125 06/02/21 0440 06/02/21 1610 06/03/21 0408 06/04/21 0248  WBC 18.8* 24.9* 28.0* 23.3* 21.9*  NEUTROABS  --   --   --   --  17.2*  HGB 8.8* 8.6* 8.7* 8.0* 8.2*  HCT 25.7* 25.2* 26.2* 24.4* 23.4*  MCV 81.1 81.3 83.2 83.6 81.3  PLT 293 412* 467* 457* 542*     Basic Metabolic Panel: Recent Labs  Lab 05/30/21 0340 05/31/21 0402 06/01/21 0125 06/02/21 0440 06/02/21 1430 06/03/21 0408 06/04/21 0248 06/05/21 [id number]  NA 132* 134* 133* 134* 132* 131* 131* 132*  K 3.7 4.5 3.8 4.3 5.0 4.6 4.6 4.0  CL 99 98 99 99 96* 96* 95* 95*  CO2 20* 16* 18* 15* 12* 16* 18* 23  GLUCOSE 111* 113* 99 98 111* 103* 102* 105*  BUN 108* 163* 136* 149* 191* 130* 156* 75*  CREATININE 6.54* 8.59* 7.31* 10.51* 11.37* 8.78* 10.31* 6.67*  CALCIUM 7.7* 7.7* 7.7* 7.6* 8.5* 7.7* 7.5* 7.4*  MG 3.3* 3.4* 2.8*  --   --   --   --   --   PHOS 4.0 6.1* 6.1* 9.9* >30.0* 11.6* >30.0* 9.3*     GFR: Estimated Creatinine Clearance: 15.4 mL/min (A) (by C-G formula based on SCr of 6.67 mg/dL (H)).  Liver Function Tests: Recent Labs  Lab 06/02/21 1430 06/02/21 1610 06/03/21 0408 06/04/21 0248 06/05/21 0442  AST  --  77*  --  42*  --   ALT  --  173*  --  110*  --   ALKPHOS  --  158*  --  136*  --   BILITOT  --  0.6  --  0.4  --   PROT  --  5.0*  --  4.8*  --   ALBUMIN 2.0* 1.9* 1.9* 1.8* 1.8*      CBG: Recent Labs  Lab 05/30/21 2326 05/31/21 0353 05/31/21 0734 05/31/21 1547 05/31/21 2332  GLUCAP 103* 193* 137* 103* 105*      Recent Results (from the past 240 hour(s))  Gastrointestinal Panel by PCR , Stool     Status: None   Collection Time: 06/01/21  5:27 AM   Specimen: Stool  Result Value Ref Range Status   Campylobacter species NOT DETECTED NOT DETECTED Final   Plesimonas shigelloides NOT DETECTED NOT DETECTED Final   Salmonella species NOT DETECTED NOT DETECTED Final   Yersinia enterocolitica NOT DETECTED NOT DETECTED Final   Vibrio species NOT DETECTED NOT DETECTED Final   Vibrio cholerae NOT DETECTED NOT DETECTED Final   Enteroaggregative E coli (EAEC) NOT DETECTED NOT DETECTED Final   Enteropathogenic E coli (EPEC) NOT DETECTED NOT DETECTED Final   Enterotoxigenic E coli (ETEC) NOT DETECTED NOT DETECTED Final   Shiga like toxin producing E coli (STEC) NOT DETECTED NOT DETECTED Final   Shigella/Enteroinvasive E coli (EIEC) NOT DETECTED NOT DETECTED Final   Cryptosporidium NOT DETECTED NOT DETECTED Final   Cyclospora cayetanensis NOT DETECTED NOT DETECTED Final   Entamoeba histolytica NOT DETECTED NOT DETECTED Final   Giardia lamblia NOT DETECTED NOT DETECTED Final   Adenovirus F40/41 NOT DETECTED NOT DETECTED Final   Astrovirus NOT DETECTED NOT DETECTED Final   Norovirus GI/GII NOT DETECTED NOT DETECTED Final   Rotavirus A NOT DETECTED NOT DETECTED Final   Sapovirus (I, II, IV, and V) NOT DETECTED NOT DETECTED Final    Comment: Performed at [facility], [address], [place], [place] [address]          Radiology Studies: No results found.      Scheduled Meds:  Chlorhexidine Gluconate Cloth  6 each Topical Q0600   Chlorhexidine Gluconate Cloth  6 each Topical Q0600   darbepoetin (ARANESP) injection - NON-DIALYSIS  100 mcg Subcutaneous Q Mon-1800   feeding supplement  237 mL Oral TID BM   folic acid  1 mg Per Tube Daily    Gerhardt's butt cream   Topical TID   heparin injection (subcutaneous)  5,000 Units Subcutaneous Q8H   multivitamin  1 tablet  Oral QHS   pantoprazole  40 mg Oral BID   Continuous Infusions:  sodium chloride Stopped (05/29/21 1602)    ceFAZolin (ANCEF) IV       LOS: 12 days       Hosie Poisson, MD [facility]   To contact the attending provider between 7A-7P or the covering provider during after hours 7P-7A, please log into the web site [email or web address] and access using universal [place] password for that web site. If you do not have the password, please call the hospital operator.  06/05/2021, 9:42 AM

## 2021-06-05 NOTE — Progress Notes (Signed)
Spoke with Dr. Kathrene Bongo who gave verbal ok in secure chat to use HD pigtail to draw labs

## 2021-06-05 NOTE — Plan of Care (Signed)
°  Problem: Education: Goal: Knowledge of General Education information will improve Description: Including pain rating scale, medication(s)/side effects and non-pharmacologic comfort measures Outcome: Progressing   Problem: Health Behavior/Discharge Planning: Goal: Ability to manage health-related needs will improve Outcome: Progressing   Problem: Clinical Measurements: Goal: Ability to maintain clinical measurements within normal limits will improve Outcome: Progressing Goal: Will remain free from infection Outcome: Progressing Goal: Diagnostic test results will improve Outcome: Progressing Goal: Respiratory complications will improve Outcome: Progressing Goal: Cardiovascular complication will be avoided Outcome: Progressing   Problem: Coping: Goal: Level of anxiety will decrease Outcome: Progressing   Problem: Pain Managment: Goal: General experience of comfort will improve Outcome: Progressing   Problem: Safety: Goal: Ability to remain free from injury will improve Outcome: Progressing   Problem: Skin Integrity: Goal: Risk for impaired skin integrity will decrease Outcome: Progressing   Problem: Activity: Goal: Risk for activity intolerance will decrease Outcome: Not Progressing   Problem: Nutrition: Goal: Adequate nutrition will be maintained Outcome: Not Progressing   Problem: Elimination: Goal: Will not experience complications related to bowel motility Outcome: Not Progressing Goal: Will not experience complications related to urinary retention Outcome: Not Progressing

## 2021-06-05 NOTE — Progress Notes (Signed)
Pccm will sign off Not much to add

## 2021-06-06 LAB — RENAL FUNCTION PANEL
Albumin: 1.8 g/dL — ABNORMAL LOW (ref 3.5–5.0)
Anion gap: 15 (ref 5–15)
BUN: 95 mg/dL — ABNORMAL HIGH (ref 6–20)
CO2: 22 mmol/L (ref 22–32)
Calcium: 7.4 mg/dL — ABNORMAL LOW (ref 8.9–10.3)
Chloride: 93 mmol/L — ABNORMAL LOW (ref 98–111)
Creatinine, Ser: 9.03 mg/dL — ABNORMAL HIGH (ref 0.61–1.24)
GFR, Estimated: 7 mL/min — ABNORMAL LOW (ref >60)
Glucose, Bld: 108 mg/dL — ABNORMAL HIGH (ref 70–99)
Phosphorus: 11 mg/dL — ABNORMAL HIGH (ref 2.5–4.6)
Potassium: 4.4 mmol/L (ref 3.5–5.1)
Sodium: 130 mmol/L — ABNORMAL LOW (ref 135–145)

## 2021-06-06 LAB — CBC
HCT: 21.7 % — ABNORMAL LOW (ref 39.0–52.0)
Hemoglobin: 7.4 g/dL — ABNORMAL LOW (ref 13.0–17.0)
MCH: 28.1 pg (ref 26.0–34.0)
MCHC: 34.1 g/dL (ref 30.0–36.0)
MCV: 82.5 fL (ref 80.0–100.0)
Platelets: 678 10*3/uL — ABNORMAL HIGH (ref 150–400)
RBC: 2.63 MIL/uL — ABNORMAL LOW (ref 4.22–5.81)
RDW: 13.9 % (ref 11.5–15.5)
WBC: 24.6 10*3/uL — ABNORMAL HIGH (ref 4.0–10.5)
nRBC: 0 % (ref 0.0–0.2)

## 2021-06-06 LAB — SEDIMENTATION RATE: Sed Rate: 85 mm/hr — ABNORMAL HIGH (ref 0–16)

## 2021-06-06 LAB — C-REACTIVE PROTEIN: CRP: 5.4 mg/dL — ABNORMAL HIGH (ref <1.0)

## 2021-06-06 LAB — PROCALCITONIN: Procalcitonin: 0.86 ng/mL

## 2021-06-06 LAB — CK: Total CK: 432 U/L — ABNORMAL HIGH (ref 49–397)

## 2021-06-06 MED ORDER — CALCIUM ACETATE (PHOS BINDER) 667 MG PO CAPS
1334.0000 mg | ORAL_CAPSULE | Freq: Three times a day (TID) | ORAL | Status: DC
  Administered 2021-06-06 – 2021-06-25 (×56): 1334 mg via ORAL
  Filled 2021-06-06 (×57): qty 2

## 2021-06-06 MED ORDER — HEPARIN SODIUM (PORCINE) 1000 UNIT/ML IJ SOLN
INTRAMUSCULAR | Status: AC
  Administered 2021-06-06: 3000 [IU]
  Filled 2021-06-06: qty 3

## 2021-06-06 NOTE — Progress Notes (Signed)
PROGRESS NOTE    Andres Braun  [id number] DOB: 1976-01-08 DOA: 05/24/2021 PCP: Merryl Hacker, No   Chief Complaint  Patient presents with   Cardiac Arrest    Brief Narrative:   46 year old male admitted to ICU 1/15 under PCCM care after cardiac arrest at home.  PEA arrest, s/p CPR x20 minutes and epi x5.  Patient reportedly out drinking with his friends the night prior to admission.  Found by friends with agonal breathing.  Intubated on arrival, needed vasopressors, noted to have renal failure, rhabdomyolysis with CK in the 14 K range, treated for aspiration pneumonia, nephrology consulted and started on CRRT.  Extubated 1/19 and off pressors.  Mental status slowly improved.  Transferred to telemetry.  Care transferred to [facility] on 1/24.  Nephrology following for HD needs.  General surgery on board for questionable small bowel ischemia and taken emergently to surgery 1/24. The bowel in question was found to be thickened, but viable.    Assessment & Plan:   Principal Problem:   Cardiac arrest with pulseless electrical activity ([place]) Active Problems:   Acute respiratory failure with hypoxia and hypercapnia (HCC)   Septic shock due to undetermined organism ([place])   AKI (acute kidney injury) ([employer])   Acute metabolic encephalopathy   Aspiration pneumonia of both lower lobes due to vomit (HCC)   Protein-calorie malnutrition, severe   Acute hypoxic respiratory failure/aspiration pneumonia Patient intubated on 05/24/2021 and extubated on 05/28/2021 patient currently on room air. Patient completed the course of antibiotics.    Ischemic enteritis versus colitis with ileus General surgery consulted, there was concerned about an ischemic bowel.  Patient was taken to the OR for diagnostic laparoscopy, surgery revealed bowel in question is thickened,  no ischemia was seen.  hence no further intervention at this time.  Advance diet as tolerated without any issues at this time. Diarrhea improved with  imodium.  Continue to monitor.     Lower extremity weakness wit left foot drop:  -MRI of the neuraxis  was unremarkable for acute process .  MRI of the femur shows Diffuse muscular abnormalities throughout the visualized portions of both thighs with heterogeneous T2 signal and enhancement, especially within the left gluteus maximus and quadratus femoris muscles. Findings are compatible with the diagnosis of rhabdomyolysis, with the differential including myositis related to connective tissue disease and polymyositis. No drainable abscess identified. pt unable to move the left lower extremity from severe pain. CK levels are improving. Vitamin b12 and folate levels wnl.   Pt has good pulse in DP, no sensory deficits. No signs of compartment syndrome. Pt reports his pain and burning sensation has improved.  Therapy evaluations recommending CIR.     Acute metabolic encephalopathy: Resolved. He is alert and answering questions appropriately.    Right anterior knee wound:  -wound care consult.    Acute kidney injury with metabolic acidosis/rhabdomyolysis Patient has been off CRRT as of 05/28/2021. Nephrology on board and appreciate recommendations.  Hoping for renal recovery, if no recovery will need a perm cath next week.   Improving CK levels.   Hyponatremia -Secondary to CKD.   Severe protein calorie malnutrition. Supplementation added. Albumin around .1.8  Mechanical fall 1/24 and hit his head -As per nursing report, prior to going to the OR, patient attempted to get out of the bed, fell and hit his head without any obvious external injuries or LOC.  Patient's friend was in the room. Advised nursing regarding strict fall precautions, placement of safety sitter if  needed and monitor closely.    Anemia of chronic disease in the setting of colitis Transfuse  to keep hemoglobin greater than 7. Hemoglobin stable around 8.   Leukocytosis:  Reactive ?   DVT prophylaxis:  Heparin.  Code Status: (Full Code) Family Communication: none at bedside.  Disposition:   Status is: Inpatient  Remains inpatient appropriate because: unsafe d/c plan.        Consultants:  Nephrology Surgery.   Procedures: none.    Antimicrobials: Antibiotics Given (last 72 hours)     Date/Time Action Medication Dose Rate   06/03/21 1453 New Bag/Given   piperacillin-tazobactam (ZOSYN) IVPB 2.25 g 2.25 g 100 mL/hr   06/03/21 2127 New Bag/Given   piperacillin-tazobactam (ZOSYN) IVPB 2.25 g 2.25 g 100 mL/hr   06/04/21 0525 New Bag/Given   piperacillin-tazobactam (ZOSYN) IVPB 2.25 g 2.25 g 100 mL/hr   06/04/21 1842 New Bag/Given   piperacillin-tazobactam (ZOSYN) IVPB 2.25 g 2.25 g 100 mL/hr   06/04/21 2323 New Bag/Given   piperacillin-tazobactam (ZOSYN) IVPB 2.25 g 2.25 g 100 mL/hr   06/05/21 0636 New Bag/Given   piperacillin-tazobactam (ZOSYN) IVPB 2.25 g 2.25 g 100 mL/hr   06/05/21 2147 New Bag/Given   ceFAZolin (ANCEF) IVPB 1 g/50 mL premix 1 g 100 mL/hr         Subjective:  No chest pain or sob, improving leg pain.   Objective: Vitals:   06/06/21 1130 06/06/21 1200 06/06/21 1228 06/06/21 1244  BP: (!) 147/93 (!) 157/89 (!) 140/91 (!) 138/98  Pulse:    (!) 106  Resp: 17 14 16 14   Temp:   98 F (36.7 C) 98 F (36.7 C)  TempSrc:   Oral Oral  SpO2:    95%  Weight:   85.6 kg   Height:        Intake/Output Summary (Last 24 hours) at 06/06/2021 1442 Last data filed at 06/06/2021 1300 Gross per 24 hour  Intake 770 ml  Output 1500 ml  Net -730 ml    Filed Weights   06/06/21 0411 06/06/21 0855 06/06/21 1228  Weight: 87 kg 87 kg 85.6 kg    Examination:  General exam: Appears calm and comfortable  Respiratory system: Clear to auscultation. Respiratory effort normal. Cardiovascular system: S1 & S2 heard, RRR. No JVD,  No pedal edema. Gastrointestinal system: Abdomen is nondistended, soft and nontender. Normal bowel sounds heard. Central nervous  system: Alert and oriented. No focal neurological deficits. Extremities: left lower extremity tenderness  and swollen.  Skin: No rashes seen.  Psychiatry:  Mood & affect appropriate.       Data Reviewed: I have personally reviewed following labs and imaging studies  CBC: Recent Labs  Lab 06/02/21 0440 06/02/21 1610 06/03/21 0408 06/04/21 0248 06/06/21 0333  WBC 24.9* 28.0* 23.3* 21.9* 24.6*  NEUTROABS  --   --   --  17.2*  --   HGB 8.6* 8.7* 8.0* 8.2* 7.4*  HCT 25.2* 26.2* 24.4* 23.4* 21.7*  MCV 81.3 83.2 83.6 81.3 82.5  PLT 412* 467* 457* 542* 678*     Basic Metabolic Panel: Recent Labs  Lab 05/31/21 0402 06/01/21 0125 06/02/21 0440 06/02/21 1430 06/03/21 0408 06/04/21 0248 06/05/21 0442 06/06/21 0333  NA 134* 133*   < > 132* 131* 131* 132* 130*  K 4.5 3.8   < > 5.0 4.6 4.6 4.0 4.4  CL 98 99   < > 96* 96* 95* 95* 93*  CO2 16* 18*   < > 12* 16*  18* 23 22  GLUCOSE 113* 99   < > 111* 103* 102* 105* 108*  BUN 163* 136*   < > 191* 130* 156* 75* 95*  CREATININE 8.59* 7.31*   < > 11.37* 8.78* 10.31* 6.67* 9.03*  CALCIUM 7.7* 7.7*   < > 8.5* 7.7* 7.5* 7.4* 7.4*  MG 3.4* 2.8*  --   --   --   --   --   --   PHOS 6.1* 6.1*   < > >30.0* 11.6* >30.0* 9.3* 11.0*   < > = values in this interval not displayed.     GFR: Estimated Creatinine Clearance: 11.3 mL/min (A) (by C-G formula based on SCr of 9.03 mg/dL (H)).  Liver Function Tests: Recent Labs  Lab 06/02/21 1610 06/03/21 0408 06/04/21 0248 06/05/21 0442 06/06/21 0333  AST 77*  --  42*  --   --   ALT 173*  --  110*  --   --   ALKPHOS 158*  --  136*  --   --   BILITOT 0.6  --  0.4  --   --   PROT 5.0*  --  4.8*  --   --   ALBUMIN 1.9* 1.9* 1.8* 1.8* 1.8*     CBG: Recent Labs  Lab 05/30/21 2326 05/31/21 0353 05/31/21 0734 05/31/21 1547 05/31/21 2332  GLUCAP 103* 193* 137* 103* 105*      Recent Results (from the past 240 hour(s))  Gastrointestinal Panel by PCR , Stool     Status: None    Collection Time: 06/01/21  5:27 AM   Specimen: Stool  Result Value Ref Range Status   Campylobacter species NOT DETECTED NOT DETECTED Final   Plesimonas shigelloides NOT DETECTED NOT DETECTED Final   Salmonella species NOT DETECTED NOT DETECTED Final   Yersinia enterocolitica NOT DETECTED NOT DETECTED Final   Vibrio species NOT DETECTED NOT DETECTED Final   Vibrio cholerae NOT DETECTED NOT DETECTED Final   Enteroaggregative E coli (EAEC) NOT DETECTED NOT DETECTED Final   Enteropathogenic E coli (EPEC) NOT DETECTED NOT DETECTED Final   Enterotoxigenic E coli (ETEC) NOT DETECTED NOT DETECTED Final   Shiga like toxin producing E coli (STEC) NOT DETECTED NOT DETECTED Final   Shigella/Enteroinvasive E coli (EIEC) NOT DETECTED NOT DETECTED Final   Cryptosporidium NOT DETECTED NOT DETECTED Final   Cyclospora cayetanensis NOT DETECTED NOT DETECTED Final   Entamoeba histolytica NOT DETECTED NOT DETECTED Final   Giardia lamblia NOT DETECTED NOT DETECTED Final   Adenovirus F40/41 NOT DETECTED NOT DETECTED Final   Astrovirus NOT DETECTED NOT DETECTED Final   Norovirus GI/GII NOT DETECTED NOT DETECTED Final   Rotavirus A NOT DETECTED NOT DETECTED Final   Sapovirus (I, II, IV, and V) NOT DETECTED NOT DETECTED Final    Comment: Performed at [facility], [address], [place], [place] [address]          Radiology Studies: No results found.      Scheduled Meds:  calcium acetate  1,334 mg Oral TID WC   Chlorhexidine Gluconate Cloth  6 each Topical Q0600   Chlorhexidine Gluconate Cloth  6 each Topical Q0600   darbepoetin (ARANESP) injection - NON-DIALYSIS  100 mcg Subcutaneous Q Mon-1800   feeding supplement  237 mL Oral TID BM   folic acid  1 mg Per Tube Daily   gabapentin  100 mg Oral BID   Gerhardt's butt cream   Topical TID   heparin injection (subcutaneous)  5,000  Units Subcutaneous Q8H   multivitamin  1 tablet Oral QHS   pantoprazole  40 mg Oral BID   Continuous  Infusions:  sodium chloride Stopped (05/29/21 1602)    ceFAZolin (ANCEF) IV 1 g (06/05/21 2147)     LOS: 13 days       Hosie Poisson, MD [facility]   To contact the attending provider between 7A-7P or the covering provider during after hours 7P-7A, please log into the web site [email or web address] and access using universal [place] password for that web site. If you do not have the password, please call the hospital operator.  06/06/2021, 2:42 PM

## 2021-06-06 NOTE — Progress Notes (Signed)
[facility] Progress Note   Subjective:   NO UOP recorded.     he reports that he is making urine but nurse says no-   Incontinent ? BUN and crt rising in between HD treatments  Objective Vitals:   06/05/21 1931 06/05/21 2354 06/06/21 0411 06/06/21 0722  BP: (!) 147/84 (!) 143/79 (!) 163/87 (!) 149/83  Pulse: 100 97 100   Resp: 19 15 20 19   Temp: 98.7 F (37.1 C) 98.9 F (37.2 C) 98.6 F (37 C) 98.7 F (37.1 C)  TempSrc: Oral Oral Oral Oral  SpO2:  96% 97%   Weight:   87 kg   Height:       Physical Exam General: awake and alert-  spoke to him with interp  Heart: RRR, no rub Lungs: coarse BL Abdomen: soft, mildly distended Extremities: 1+ edema,  Neuro:  awake and alert, oriented to self and [place], cannot wiggle L toes or dorsiflex, R can move toes Dialysis Access:  R IJ  temp HD catheter -  placed 1/23  Additional Objective Labs: Basic Metabolic Panel: Recent Labs  Lab 06/04/21 0248 06/05/21 0442 06/06/21 0333  NA 131* 132* 130*  K 4.6 4.0 4.4  CL 95* 95* 93*  CO2 18* 23 22  GLUCOSE 102* 105* 108*  BUN 156* 75* 95*  CREATININE 10.31* 6.67* 9.03*  CALCIUM 7.5* 7.4* 7.4*  PHOS >30.0* 9.3* 11.0*   Liver Function Tests: Recent Labs  Lab 06/02/21 1610 06/03/21 0408 06/04/21 0248 06/05/21 0442 06/06/21 0333  AST 77*  --  42*  --   --   ALT 173*  --  110*  --   --   ALKPHOS 158*  --  136*  --   --   BILITOT 0.6  --  0.4  --   --   PROT 5.0*  --  4.8*  --   --   ALBUMIN 1.9*   < > 1.8* 1.8* 1.8*   < > = values in this interval not displayed.   No results for input(s): LIPASE, AMYLASE in the last 168 hours. CBC: Recent Labs  Lab 06/02/21 0440 06/02/21 1610 06/03/21 0408 06/04/21 0248 06/06/21 0333  WBC 24.9* 28.0* 23.3* 21.9* 24.6*  NEUTROABS  --   --   --  17.2*  --   HGB 8.6* 8.7* 8.0* 8.2* 7.4*  HCT 25.2* 26.2* 24.4* 23.4* 21.7*  MCV 81.3 83.2 83.6 81.3 82.5  PLT 412* 467* 457* 542* 678*   Blood Culture No results found for:  SDES, SPECREQUEST, CULT, REPTSTATUS  Cardiac Enzymes: Recent Labs  Lab 06/02/21 1610 06/03/21 0408 06/04/21 0248 06/05/21 0442 06/06/21 0333  CKTOTAL 2,119* 1,166* 783* 462* 432*   CBG: Recent Labs  Lab 05/30/21 2326 05/31/21 0353 05/31/21 0734 05/31/21 1547 05/31/21 2332  GLUCAP 103* 193* 137* 103* 105*   Iron Studies:  Recent Labs    06/05/21 1512  IRON 23*  TIBC 197*  FERRITIN 276    @lablastinr3 @ Studies/Results: No results found. Medications:  sodium chloride Stopped (05/29/21 1602)    ceFAZolin (ANCEF) IV 1 g (06/05/21 2147)    Chlorhexidine Gluconate Cloth  6 each Topical Q0600   Chlorhexidine Gluconate Cloth  6 each Topical Q0600   darbepoetin (ARANESP) injection - NON-DIALYSIS  100 mcg Subcutaneous Q Mon-1800   feeding supplement  237 mL Oral TID BM   folic acid  1 mg Per Tube Daily   gabapentin  100 mg Oral BID   Gerhardt's butt cream  Topical TID   heparin injection (subcutaneous)  5,000 Units Subcutaneous Q8H   multivitamin  1 tablet Oral QHS   pantoprazole  40 mg Oral BID    Assessment/ Plan: AKI severe: in setting of cardiac arrest, rhabdo.  Oliguric to anuric.  Required CRRT 1/15 - 1/19.   HD 1/22 ,  1/24  and 1/26 - has new IJ line-  BUN still very high-  catabolic-  some UOP recorded -  very high BUN and no significant signs of renal recovery-  plan for another HD treatment today -  then follow for signs of recovery-  still relatively hopeful for that -  temp cath in for 5 days now-  will likely need perm line if still req HD next week SP cardiac arrest - asystolic arrest in setting of prob asp PNA/ ARDS in setting of EtOH Severe metabolic/ resp acidosis -     overall imroving with HD   LE neurologic deficits - ortho and neuro as well as NSG seeing-  thought is fluid in psoas muscle could be causing some compression-    Possible ischemic bowel - surgery consulting-  ex lap this past week did not show ischemic bowel needing resection but with  some edema in bowel still   Anemia- hgb in the 8's but slowly drifting down with events -    iron stores low but defer iron right now given high WBC- giving ESA -  transfuse PRN  Bones-  phos very high-  will start binder - phoslo    Cecille Aver  06/06/2021, 7:59 AM  [facility] Pager: [phone]

## 2021-06-06 NOTE — Plan of Care (Signed)
  Problem: Clinical Measurements: Goal: Ability to maintain clinical measurements within normal limits will improve Outcome: Progressing Goal: Will remain free from infection Outcome: Progressing Goal: Respiratory complications will improve Outcome: Progressing Goal: Cardiovascular complication will be avoided Outcome: Progressing   Problem: Coping: Goal: Level of anxiety will decrease Outcome: Progressing   

## 2021-06-07 LAB — CBC
HCT: 22.1 % — ABNORMAL LOW (ref 39.0–52.0)
Hemoglobin: 7.5 g/dL — ABNORMAL LOW (ref 13.0–17.0)
MCH: 28.6 pg (ref 26.0–34.0)
MCHC: 33.9 g/dL (ref 30.0–36.0)
MCV: 84.4 fL (ref 80.0–100.0)
Platelets: 762 10*3/uL — ABNORMAL HIGH (ref 150–400)
RBC: 2.62 MIL/uL — ABNORMAL LOW (ref 4.22–5.81)
RDW: 14.2 % (ref 11.5–15.5)
WBC: 24.1 10*3/uL — ABNORMAL HIGH (ref 4.0–10.5)
nRBC: 0 % (ref 0.0–0.2)

## 2021-06-07 LAB — C DIFFICILE (CDIFF) QUICK SCRN (NO PCR REFLEX)
C Diff antigen: NEGATIVE
C Diff interpretation: NOT DETECTED
C Diff toxin: NEGATIVE

## 2021-06-07 LAB — RENAL FUNCTION PANEL
Albumin: 1.8 g/dL — ABNORMAL LOW (ref 3.5–5.0)
Anion gap: 12 (ref 5–15)
BUN: 53 mg/dL — ABNORMAL HIGH (ref 6–20)
CO2: 24 mmol/L (ref 22–32)
Calcium: 7.6 mg/dL — ABNORMAL LOW (ref 8.9–10.3)
Chloride: 93 mmol/L — ABNORMAL LOW (ref 98–111)
Creatinine, Ser: 6.6 mg/dL — ABNORMAL HIGH (ref 0.61–1.24)
GFR, Estimated: 10 mL/min — ABNORMAL LOW (ref >60)
Glucose, Bld: 107 mg/dL — ABNORMAL HIGH (ref 70–99)
Phosphorus: 7.9 mg/dL — ABNORMAL HIGH (ref 2.5–4.6)
Potassium: 4.4 mmol/L (ref 3.5–5.1)
Sodium: 129 mmol/L — ABNORMAL LOW (ref 135–145)

## 2021-06-07 LAB — PROCALCITONIN: Procalcitonin: 0.93 ng/mL

## 2021-06-07 MED ORDER — CHLORHEXIDINE GLUCONATE CLOTH 2 % EX PADS
6.0000 | MEDICATED_PAD | Freq: Every day | CUTANEOUS | Status: DC
  Administered 2021-06-08 – 2021-06-21 (×14): 6 via TOPICAL

## 2021-06-07 NOTE — Progress Notes (Signed)
[facility] Progress Note   Subjective:   NO UOP recorded.    HD yest-  removed 1500-  labs reflective of HD   Objective Vitals:   06/06/21 1508 06/06/21 1944 06/06/21 2242 06/07/21 0418  BP: (!) 148/93 (!) 155/90 (!) 153/89 (!) 149/93  Pulse: (!) 103 99 (!) 106 (!) 110  Resp: 14 16 20 18   Temp: 99.7 F (37.6 C) 98.5 F (36.9 C) 98.5 F (36.9 C) 98.5 F (36.9 C)  TempSrc: Tympanic Oral Oral Oral  SpO2: 95% 98% 95% 98%  Weight:    86.9 kg  Height:       Physical Exam General: awake and alert-  spoke to him with interp  Heart: RRR, no rub Lungs: coarse BL Abdomen: soft, mildly distended Extremities: 1+ edema,  Neuro:  awake and alert, oriented to self and [place], cannot wiggle L toes or dorsiflex, R can move toes Dialysis Access:  R IJ  temp HD catheter -  placed 1/23  Additional Objective Labs: Basic Metabolic Panel: Recent Labs  Lab 06/05/21 0442 06/06/21 0333 06/07/21 0222  NA 132* 130* 129*  K 4.0 4.4 4.4  CL 95* 93* 93*  CO2 23 22 24   GLUCOSE 105* 108* 107*  BUN 75* 95* 53*  CREATININE 6.67* 9.03* 6.60*  CALCIUM 7.4* 7.4* 7.6*  PHOS 9.3* 11.0* 7.9*   Liver Function Tests: Recent Labs  Lab 06/02/21 1610 06/03/21 0408 06/04/21 0248 06/05/21 0442 06/06/21 0333 06/07/21 0222  AST 77*  --  42*  --   --   --   ALT 173*  --  110*  --   --   --   ALKPHOS 158*  --  136*  --   --   --   BILITOT 0.6  --  0.4  --   --   --   PROT 5.0*  --  4.8*  --   --   --   ALBUMIN 1.9*   < > 1.8* 1.8* 1.8* 1.8*   < > = values in this interval not displayed.   No results for input(s): LIPASE, AMYLASE in the last 168 hours. CBC: Recent Labs  Lab 06/02/21 1610 06/03/21 0408 06/04/21 0248 06/06/21 0333 06/07/21 0222  WBC 28.0* 23.3* 21.9* 24.6* 24.1*  NEUTROABS  --   --  17.2*  --   --   HGB 8.7* 8.0* 8.2* 7.4* 7.5*  HCT 26.2* 24.4* 23.4* 21.7* 22.1*  MCV 83.2 83.6 81.3 82.5 84.4  PLT 467* 457* 542* 678* 762*   Blood Culture No results found for:  SDES, SPECREQUEST, CULT, REPTSTATUS  Cardiac Enzymes: Recent Labs  Lab 06/02/21 1610 06/03/21 0408 06/04/21 0248 06/05/21 0442 06/06/21 0333  CKTOTAL 2,119* 1,166* 783* 462* 432*   CBG: Recent Labs  Lab 05/31/21 1547 05/31/21 2332  GLUCAP 103* 105*   Iron Studies:  Recent Labs    06/05/21 1512  IRON 23*  TIBC 197*  FERRITIN 276    @lablastinr3 @ Studies/Results: No results found. Medications:  sodium chloride Stopped (05/29/21 1602)    ceFAZolin (ANCEF) IV 1 g (06/06/21 2137)    calcium acetate  1,334 mg Oral TID WC   Chlorhexidine Gluconate Cloth  6 each Topical Q0600   Chlorhexidine Gluconate Cloth  6 each Topical Q0600   darbepoetin (ARANESP) injection - NON-DIALYSIS  100 mcg Subcutaneous Q Mon-1800   feeding supplement  237 mL Oral TID BM   folic acid  1 mg Per Tube Daily   gabapentin  100 mg Oral BID   Gerhardt's butt cream   Topical TID   heparin injection (subcutaneous)  5,000 Units Subcutaneous Q8H   multivitamin  1 tablet Oral QHS   pantoprazole  40 mg Oral BID    Assessment/ Plan: AKI severe: in setting of cardiac arrest, rhabdo.  Oliguric to anuric.  Required CRRT 1/15 - 1/19.   HD 1/22 ,  1/24  and 1/26 and 1/28- has new IJ temp line placed 1/23-  BUN still very high-  catabolic-  some UOP recorded -  very high BUN and no significant signs of renal recovery-  plan for another HD likely Monday but will check labs prior-  then follow for signs of recovery-  still relatively hopeful for that -  temp cath in for 6 days now-  will  need perm line if still req HD next week SP cardiac arrest - asystolic arrest in setting of prob asp PNA/ ARDS in setting of EtOH Severe metabolic/ resp acidosis -     overall imroving with HD   LE neurologic deficits - ortho and neuro as well as NSG seeing-  thought is fluid in psoas muscle could be causing some compression ?  No improvement   Possible ischemic bowel - surgery consulting-  ex lap this past week did not show  ischemic bowel needing resection but with some edema in bowel still   Anemia- hgb in the 8's but slowly drifting down with events -    iron stores low but defer iron right now given high WBC- giving ESA -  transfuse PRN  Bones-  phos very high-  now on phoslo    Cecille Aver  06/07/2021, 7:49 AM  [facility] Pager: [phone]

## 2021-06-07 NOTE — Progress Notes (Signed)
PROGRESS NOTE    Andres Braun  ZOX:[id number] DOB: 1975/10/25 DOA: 05/24/2021 PCP: Merryl Hacker, No   Chief Complaint  Patient presents with   Cardiac Arrest    Brief Narrative:   46 year old male admitted to ICU 1/15 under PCCM care after cardiac arrest at home.  PEA arrest, s/p CPR x20 minutes and epi x5.  Patient reportedly out drinking with his friends the night prior to admission.  Found by friends with agonal breathing.  Intubated on arrival, needed vasopressors, noted to have renal failure, rhabdomyolysis with CK in the 14 K range, treated for aspiration pneumonia, nephrology consulted and started on CRRT.  Extubated 1/19 and off pressors.  Mental status slowly improved.  Transferred to telemetry.  Care transferred to [facility] on 1/24.  Nephrology following for HD needs.  General surgery on board for questionable small bowel ischemia and taken emergently to surgery 1/24. The bowel in question was found to be thickened, but viable.  Pt seen and examined. He reports leg pain is improving, swelling is improving. But his leukocytosis is persistent with minimal pro calcitonin. Will request ID to see if we need to continue antibiotics for muscle necrosis.    Assessment & Plan:   Principal Problem:   Cardiac arrest with pulseless electrical activity ([place]) Active Problems:   Acute respiratory failure with hypoxia and hypercapnia (HCC)   Septic shock due to undetermined organism ([place])   AKI (acute kidney injury) ([place])   Acute metabolic encephalopathy   Aspiration pneumonia of both lower lobes due to vomit (HCC)   Protein-calorie malnutrition, severe   Acute hypoxic respiratory failure/aspiration pneumonia Patient intubated on 05/24/2021 and extubated on 05/28/2021 . Patient completed the course of antibiotics for aspiration pneumonia. Patient on RA.     Ischemic enteritis versus colitis with ileus General surgery consulted, there was concerned about an ischemic bowel.  Patient was taken to the  OR for diagnostic laparoscopy, surgery revealed bowel in question is thickened,  no ischemia was seen.  hence no further intervention at this time.  Advance diet as tolerated without any issues at this time. Diarrhea improved with imodium.  Continue to monitor.     Lower extremity weakness with left foot drop:  -MRI of the neuraxis  was unremarkable for acute process .  As per the MRI of the lumbar spine, multiple fluid collections in the right ileo psoas muscle.  Dr Radene Gunning spoke with neurology and neuro surgery on 1/24, no surgical cause for his lower extremity weakness. He suggested that a psoas abscess or hematoma could cause lumbar plexus compression /irritation leading to secondary weakness.   IR consulted for aspiration, suggested any aspiration would predispose to infection. Did not advise aspiration.  MRI of the femur shows Diffuse muscular abnormalities throughout the visualized portions of both thighs with heterogeneous T2 signal and enhancement, especially within the left gluteus maximus and quadratus femoris muscles. Findings are compatible with the diagnosis of rhabdomyolysis, with the differential including myositis related to connective tissue disease and polymyositis. No drainable abscess identified. pt unable to move the left lower extremity from severe pain. Orthopedics consulted, suggested its a neurological/ lumbar plexus ischemic injury.  CK levels are improving. Vitamin b12 and folate levels wnl. ESR is 85, CRP is 5.4   Pt has good pulse in DP, no sensory deficits. No signs of compartment syndrome.  Pt reports his pain and burning sensation has improved.  Therapy evaluations recommending CIR.     Acute metabolic encephalopathy: Resolved. He is alert and  answering questions appropriately.    Right anterior knee wound:  -wound care consult.    Acute kidney injury with metabolic acidosis/rhabdomyolysis Patient has been off CRRT as of 05/28/2021. Nephrology on board and  appreciate recommendations.  Hoping for renal recovery, if no recovery will need a perm cath next week.   Improving CK levels.   Hyponatremia -Secondary to AKI   Severe protein calorie malnutrition. Supplementation added. Albumin around .1.8  Mechanical fall 1/24 and hit his head -As per nursing report, prior to going to the OR, patient attempted to get out of the bed, fell and hit his head without any obvious external injuries or LOC.  Patient's friend was in the room. Advised nursing regarding strict fall precautions, placement of safety sitter if needed and monitor closely.   Anemia of acute illness/  acute anemia of blood loss from hematoma in the psoas muscle  Hemoglobin on admission is around 15.9 to  12.6 to 11.8 to 10.5 to 9.9 to 8.7 to 8.2 to 7.5 Transfuse  to keep hemoglobin greater than 7. Iron deficiency on anemia panel. Will need supplementation on discharge.  Stool for occult blood is positive on 05/30/21. Suspect from colitis.  If hemoglobin continues to drop. Will scan his leg to evaluate for worsening bleeding.   Elevated liver enzymes:  Probably from ETOH abuse, shock liver and rhabdomyolysis.  Improving . Recheck CMP in am.    Leukocytosis:  Reactive or from muscle necrosis.  Requested ID to see if we need to continue antibiotics.    Thrombocytosis:  Reactive or from infection.   DVT prophylaxis: Heparin.  Code Status: (Full Code) Family Communication: none at bedside.  Disposition:   Status is: Inpatient  Remains inpatient appropriate because: unsafe d/c plan.        Consultants:  Nephrology Surgery.  Orthopedics Dr Dr Sammuel Hines. ID  PCCM Neurology.   Procedures:   Exploratory laparotomy Mechanical ventilation CRRT Hemodialysis   Antimicrobials: Antibiotics Given (last 72 hours)     Date/Time Action Medication Dose Rate   06/04/21 1842 New Bag/Given   piperacillin-tazobactam (ZOSYN) IVPB 2.25 g 2.25 g 100 mL/hr   06/04/21 2323  New Bag/Given   piperacillin-tazobactam (ZOSYN) IVPB 2.25 g 2.25 g 100 mL/hr   06/05/21 0636 New Bag/Given   piperacillin-tazobactam (ZOSYN) IVPB 2.25 g 2.25 g 100 mL/hr   06/05/21 2147 New Bag/Given   ceFAZolin (ANCEF) IVPB 1 g/50 mL premix 1 g 100 mL/hr   06/06/21 2137 New Bag/Given   ceFAZolin (ANCEF) IVPB 1 g/50 mL premix 1 g 100 mL/hr         Subjective:  Pt reports leg pain is improving, but still unable to move the leg.   Objective: Vitals:   06/06/21 2242 06/07/21 0418 06/07/21 0800 06/07/21 1115  BP: (!) 153/89 (!) 149/93 (!) 142/77 (!) 146/91  Pulse: (!) 106 (!) 110 (!) 103 98  Resp: 20 18 20 20   Temp: 98.5 F (36.9 C) 98.5 F (36.9 C) 98.6 F (37 C) 98.5 F (36.9 C)  TempSrc: Oral Oral Oral Oral  SpO2: 95% 98% 98% 98%  Weight:  86.9 kg    Height:        Intake/Output Summary (Last 24 hours) at 06/07/2021 1314 Last data filed at 06/07/2021 1118 Gross per 24 hour  Intake 720 ml  Output 0 ml  Net 720 ml    Filed Weights   06/06/21 0855 06/06/21 1228 06/07/21 0418  Weight: 87 kg 85.6 kg 86.9 kg  Examination:  General exam: well developed gentleman, not in distress.  Respiratory system: Clear to auscultation. Respiratory effort normal. Cardiovascular system: S1 & S2 heard, tachycardic.  No JVD, murmurs, Gastrointestinal system: Abdomen is soft, mildy tender in the lower quadrant. Normal bowel sounds heard. Central nervous system: Alert and oriented. Left lower extremity weakness 0/5.  Extremities: bilateral leg edema, left more than right. Tenderness present.  Skin: right knee wound bandaged.  Psychiatry:  Mood & affect appropriate.        Data Reviewed: I have personally reviewed following labs and imaging studies  CBC: Recent Labs  Lab 06/02/21 1610 06/03/21 0408 06/04/21 0248 06/06/21 0333 06/07/21 0222  WBC 28.0* 23.3* 21.9* 24.6* 24.1*  NEUTROABS  --   --  17.2*  --   --   HGB 8.7* 8.0* 8.2* 7.4* 7.5*  HCT 26.2* 24.4* 23.4* 21.7*  22.1*  MCV 83.2 83.6 81.3 82.5 84.4  PLT 467* 457* 542* 678* 762*     Basic Metabolic Panel: Recent Labs  Lab 06/01/21 0125 06/02/21 0440 06/03/21 0408 06/04/21 0248 06/05/21 0442 06/06/21 0333 06/07/21 0222  NA 133*   < > 131* 131* 132* 130* 129*  K 3.8   < > 4.6 4.6 4.0 4.4 4.4  CL 99   < > 96* 95* 95* 93* 93*  CO2 18*   < > 16* 18* 23 22 24   GLUCOSE 99   < > 103* 102* 105* 108* 107*  BUN 136*   < > 130* 156* 75* 95* 53*  CREATININE 7.31*   < > 8.78* 10.31* 6.67* 9.03* 6.60*  CALCIUM 7.7*   < > 7.7* 7.5* 7.4* 7.4* 7.6*  MG 2.8*  --   --   --   --   --   --   PHOS 6.1*   < > 11.6* >30.0* 9.3* 11.0* 7.9*   < > = values in this interval not displayed.     GFR: Estimated Creatinine Clearance: 15.5 mL/min (A) (by C-G formula based on SCr of 6.6 mg/dL (H)).  Liver Function Tests: Recent Labs  Lab 06/02/21 1610 06/03/21 0408 06/04/21 0248 06/05/21 0442 06/06/21 0333 06/07/21 0222  AST 77*  --  42*  --   --   --   ALT 173*  --  110*  --   --   --   ALKPHOS 158*  --  136*  --   --   --   BILITOT 0.6  --  0.4  --   --   --   PROT 5.0*  --  4.8*  --   --   --   ALBUMIN 1.9* 1.9* 1.8* 1.8* 1.8* 1.8*     CBG: Recent Labs  Lab 05/31/21 1547 05/31/21 2332  GLUCAP 103* 105*      Recent Results (from the past 240 hour(s))  Gastrointestinal Panel by PCR , Stool     Status: None   Collection Time: 06/01/21  5:27 AM   Specimen: Stool  Result Value Ref Range Status   Campylobacter species NOT DETECTED NOT DETECTED Final   Plesimonas shigelloides NOT DETECTED NOT DETECTED Final   Salmonella species NOT DETECTED NOT DETECTED Final   Yersinia enterocolitica NOT DETECTED NOT DETECTED Final   Vibrio species NOT DETECTED NOT DETECTED Final   Vibrio cholerae NOT DETECTED NOT DETECTED Final   Enteroaggregative E coli (EAEC) NOT DETECTED NOT DETECTED Final   Enteropathogenic E coli (EPEC) NOT DETECTED NOT DETECTED Final   Enterotoxigenic E coli (  ETEC) NOT DETECTED NOT  DETECTED Final   Shiga like toxin producing E coli (STEC) NOT DETECTED NOT DETECTED Final   Shigella/Enteroinvasive E coli (EIEC) NOT DETECTED NOT DETECTED Final   Cryptosporidium NOT DETECTED NOT DETECTED Final   Cyclospora cayetanensis NOT DETECTED NOT DETECTED Final   Entamoeba histolytica NOT DETECTED NOT DETECTED Final   Giardia lamblia NOT DETECTED NOT DETECTED Final   Adenovirus F40/41 NOT DETECTED NOT DETECTED Final   Astrovirus NOT DETECTED NOT DETECTED Final   Norovirus GI/GII NOT DETECTED NOT DETECTED Final   Rotavirus A NOT DETECTED NOT DETECTED Final   Sapovirus (I, II, IV, and V) NOT DETECTED NOT DETECTED Final    Comment: Performed at [facility], [address] [place], [place] [address]          Radiology Studies: No results found.      Scheduled Meds:  calcium acetate  1,334 mg Oral TID WC   Chlorhexidine Gluconate Cloth  6 each Topical Q0600   Chlorhexidine Gluconate Cloth  6 each Topical Q0600   darbepoetin (ARANESP) injection - NON-DIALYSIS  100 mcg Subcutaneous Q Mon-1800   feeding supplement  237 mL Oral TID BM   folic acid  1 mg Per Tube Daily   gabapentin  100 mg Oral BID   Gerhardt's butt cream   Topical TID   heparin injection (subcutaneous)  5,000 Units Subcutaneous Q8H   multivitamin  1 tablet Oral QHS   pantoprazole  40 mg Oral BID   Continuous Infusions:  sodium chloride Stopped (05/29/21 1602)    ceFAZolin (ANCEF) IV 1 g (06/06/21 2137)     LOS: 14 days       Hosie Poisson, MD [facility]   To contact the attending provider between 7A-7P or the covering provider during after hours 7P-7A, please log into the web site [email or web address] and access using universal [place] password for that web site. If you do not have the password, please call the hospital operator.  06/07/2021, 1:14 PM

## 2021-06-07 NOTE — Plan of Care (Signed)
°  Problem: Clinical Measurements: Goal: Ability to maintain clinical measurements within normal limits will improve Outcome: Progressing Goal: Will remain free from infection Outcome: Progressing Goal: Respiratory complications will improve Outcome: Progressing Goal: Cardiovascular complication will be avoided Outcome: Progressing   Problem: Safety: Goal: Ability to remain free from injury will improve Outcome: Progressing   Problem: Education: Goal: Knowledge of General Education information will improve Description: Including pain rating scale, medication(s)/side effects and non-pharmacologic comfort measures Outcome: Not Progressing   Problem: Health Behavior/Discharge Planning: Goal: Ability to manage health-related needs will improve Outcome: Not Progressing   Problem: Clinical Measurements: Goal: Diagnostic test results will improve Outcome: Not Progressing   Problem: Activity: Goal: Risk for activity intolerance will decrease Outcome: Not Progressing   Problem: Nutrition: Goal: Adequate nutrition will be maintained Outcome: Not Progressing   Problem: Coping: Goal: Level of anxiety will decrease Outcome: Not Progressing   Problem: Elimination: Goal: Will not experience complications related to bowel motility Outcome: Not Progressing Goal: Will not experience complications related to urinary retention Outcome: Not Progressing   Problem: Pain Managment: Goal: General experience of comfort will improve Outcome: Not Progressing   Problem: Skin Integrity: Goal: Risk for impaired skin integrity will decrease Outcome: Not Progressing

## 2021-06-07 NOTE — Consult Note (Addendum)
[name] for Infectious Disease    Date of Admission:  05/24/2021   Total days of inpatient antibiotics 12        Reason for Consult: Leukocytosis    Principal Problem:   Cardiac arrest with pulseless electrical activity ([place]) Active Problems:   Acute respiratory failure with hypoxia and hypercapnia (HCC)   Septic shock due to undetermined organism ([place])   AKI (acute kidney injury) ([place])   Acute metabolic encephalopathy   Aspiration pneumonia of both lower lobes due to vomit (HCC)   Protein-calorie malnutrition, severe   Assessment: 46 year old spanish speaking male found down after drinking with friends. Intubated on arrival , extubated on 1/19. CK on admission 14K. He was started on ceftriaxone for aspiration pneumonia, hospital course complicated by colitis and LLE weakness. Taken to OR by general surgery and found to have bowel thickening without concern for infection. His LLE weakness was initially thought to be a psoas abscess compressing lumbar plexus. MR left femur did not show a fluid collection. ID engaged as pt continues to have leukocytosis.   #Persistent Leukocytosis #Aspiration Pneumonia-treated with ceftriaxone followed by pip-tazo #Ischemic enteritis vs colitis with ileus -On 1/25, MRI femur showed left gluteus maximus and quadratus femoris muscle enhancement consistent with rhabdomyolysis although myositis/polymyositis in the differential.  Etiology of leukocytosis: -On exam there was more LLE warmth and tenderness compared to the right, unclear if this is infectious in nature. Pt reports LLE tenderness is improving as such would continue cefazolin for now.  -Pt does have about 4 loose stool a day, ordered stool testing. -Lastly, leukocytosis could be reactive, as such will need to continue to monitor with cbc.  Recommendations:  -Obtain blood Cx -Obtain cdiff, GIP -Monitor leukocytosis -Continue cefazolin for now as rest of work up is pending.  Can transition to keflex to complete 3 weeks of antibiotics for myositis EOT 06/19/21  closer to discharge.  Microbiology:   Antibiotics: Ceftriaxone 1/15-19, 1/21-23 Metronidazole 1/21-23 Pip-tazo 1/24-26 Cefazolin 1/27-28     HPI: Andres Braun is a 46 y.o. male admitted  to ICU on 1/15 under PCCM care after cardiac arrest at home. He has PEA arrest SP CPR x 20 minutes. Noted that pt had been drinking with friends prior to admission and was found down with agonal breathing. Intubated on arrival, CK was 14K. Extubated  on 1/19. Pt requiring CRRT due to AKI. Pt developed abdominal pain.  CT on 1/23  showed signs concerning for small bowel ischemia.  Pt was on ceftriaxone and metronidazole for  possible aspiration pneumonia which was transitioned to piptazo in setting of colitis. Taken to OR with general surgery SP laparoscopy found to have bowel thickening without evidence of necrosis.  In the setting of LLE weakness: CT  on 1/23 showed left  posas/gluteal muscular swelling. Concern for possible psoas abscess leading to lumbar plexus compression. Neruosergy was consulted, no plans for intervention. Dedicated MR femur showed findings consistent with rhabdomyolysis with possible overlying cellulitis. Pt is now on cefazolin. ID engaged for persistent leukocytosis.  Today, he reports about 4 loose stools a day. He noted that his LLE pain has improved.  Review of Systems: Review of Systems  All other systems reviewed and are negative.  History reviewed. No pertinent past medical history.  Social History   Tobacco Use   Smoking status: Unknown   Tobacco comments:    This is an Urgent case - to OR  Substance  Use Topics   Alcohol use: Yes   Drug use: Not Currently    Comment: pt denies    History reviewed. No pertinent family history. Scheduled Meds:  calcium acetate  1,334 mg Oral TID WC   Chlorhexidine Gluconate Cloth  6 each Topical Q0600   Chlorhexidine Gluconate Cloth  6 each  Topical Q0600   [START ON 06/08/2021] Chlorhexidine Gluconate Cloth  6 each Topical Q0600   darbepoetin (ARANESP) injection - NON-DIALYSIS  100 mcg Subcutaneous Q Mon-1800   feeding supplement  237 mL Oral TID BM   folic acid  1 mg Per Tube Daily   gabapentin  100 mg Oral BID   Gerhardt's butt cream   Topical TID   heparin injection (subcutaneous)  5,000 Units Subcutaneous Q8H   multivitamin  1 tablet Oral QHS   pantoprazole  40 mg Oral BID   Continuous Infusions:  sodium chloride Stopped (05/29/21 1602)    ceFAZolin (ANCEF) IV 1 g (06/06/21 2137)   PRN Meds:.acetaminophen, albuterol, guaiFENesin-dextromethorphan, hydrALAZINE, morphine injection, oxyCODONE No Known Allergies  OBJECTIVE: Blood pressure (!) 146/91, pulse 98, temperature 98.5 F (36.9 C), temperature source Oral, resp. rate 20, height 6' (1.829 m), weight 86.9 kg, SpO2 98 %.  Physical Exam Constitutional:      General: He is not in acute distress.    Appearance: He is normal weight. He is not toxic-appearing.  HENT:     Head: Normocephalic and atraumatic.     Right Ear: External ear normal.     Left Ear: External ear normal.     Nose: No congestion or rhinorrhea.     Mouth/Throat:     Mouth: Mucous membranes are moist.     Pharynx: Oropharynx is clear.  Eyes:     Extraocular Movements: Extraocular movements intact.     Conjunctiva/sclera: Conjunctivae normal.     Pupils: Pupils are equal, round, and reactive to light.  Cardiovascular:     Rate and Rhythm: Normal rate and regular rhythm.     Heart sounds: No murmur heard.   No friction rub. No gallop.  Pulmonary:     Effort: Pulmonary effort is normal.     Breath sounds: Normal breath sounds.  Abdominal:     General: Abdomen is flat. Bowel sounds are normal.     Palpations: Abdomen is soft.  Musculoskeletal:        General: No swelling.     Cervical back: Normal range of motion and neck supple.     Comments: Unable to lift b/l LE LLE warmth and  tenderness>RLE No erythema b/l LE noted  Skin:    General: Skin is warm and dry.  Neurological:     General: No focal deficit present.     Mental Status: He is oriented to person, place, and time.  Psychiatric:        Mood and Affect: Mood normal.    Lab Results Lab Results  Component Value Date   WBC 24.1 (H) 06/07/2021   HGB 7.5 (L) 06/07/2021   HCT 22.1 (L) 06/07/2021   MCV 84.4 06/07/2021   PLT 762 (H) 06/07/2021    Lab Results  Component Value Date   CREATININE 6.60 (H) 06/07/2021   BUN 53 (H) 06/07/2021   NA 129 (L) 06/07/2021   K 4.4 06/07/2021   CL 93 (L) 06/07/2021   CO2 24 06/07/2021    Lab Results  Component Value Date   ALT 110 (H) 06/04/2021   AST 42 (H) 06/04/2021  ALKPHOS 136 (H) 06/04/2021   BILITOT 0.4 06/04/2021       Laurice Record, [facility]ious [facility] 06/07/2021, 2:12 PM

## 2021-06-07 NOTE — Progress Notes (Signed)
Inpatient Rehab Admissions Coordinator:  Attempted to contact [place] [facility] using interpretor Nevin Bloodgood [phone]. Was trying to follow up regarding dispo.  Isaias did not answer. Nevin Bloodgood left a message. Awaiting return call.  Will continue to follow.  Gayland Curry, [place], [place] Admissions Coordinator [phone]

## 2021-06-08 LAB — GASTROINTESTINAL PANEL BY PCR, STOOL (REPLACES STOOL CULTURE)

## 2021-06-08 LAB — HEPATIC FUNCTION PANEL
ALT: 16 U/L (ref 0–44)
AST: 25 U/L (ref 15–41)
Albumin: 1.8 g/dL — ABNORMAL LOW (ref 3.5–5.0)
Alkaline Phosphatase: 148 U/L — ABNORMAL HIGH (ref 38–126)
Bilirubin, Direct: <0.1 mg/dL (ref 0.0–0.2)
Total Bilirubin: 0.2 mg/dL — ABNORMAL LOW (ref 0.3–1.2)
Total Protein: 5.3 g/dL — ABNORMAL LOW (ref 6.5–8.1)

## 2021-06-08 LAB — RENAL FUNCTION PANEL
Albumin: 1.6 g/dL — ABNORMAL LOW (ref 3.5–5.0)
Anion gap: 11 (ref 5–15)
BUN: 65 mg/dL — ABNORMAL HIGH (ref 6–20)
CO2: 20 mmol/L — ABNORMAL LOW (ref 22–32)
Calcium: 6.8 mg/dL — ABNORMAL LOW (ref 8.9–10.3)
Chloride: 100 mmol/L (ref 98–111)
Creatinine, Ser: 8.3 mg/dL — ABNORMAL HIGH (ref 0.61–1.24)
GFR, Estimated: 7 mL/min — ABNORMAL LOW (ref >60)
Glucose, Bld: 109 mg/dL — ABNORMAL HIGH (ref 70–99)
Phosphorus: 9.2 mg/dL — ABNORMAL HIGH (ref 2.5–4.6)
Potassium: 4.2 mmol/L (ref 3.5–5.1)
Sodium: 131 mmol/L — ABNORMAL LOW (ref 135–145)

## 2021-06-08 LAB — CBC WITH DIFFERENTIAL/PLATELET
Abs Immature Granulocytes: 0.56 10*3/uL — ABNORMAL HIGH (ref 0.00–0.07)
Basophils Absolute: 0.1 10*3/uL (ref 0.0–0.1)
Basophils Relative: 0 %
Eosinophils Absolute: 0.3 10*3/uL (ref 0.0–0.5)
Eosinophils Relative: 1 %
HCT: 22 % — ABNORMAL LOW (ref 39.0–52.0)
Hemoglobin: 7.3 g/dL — ABNORMAL LOW (ref 13.0–17.0)
Immature Granulocytes: 3 %
Lymphocytes Relative: 6 %
Lymphs Abs: 1.2 10*3/uL (ref 0.7–4.0)
MCH: 28.3 pg (ref 26.0–34.0)
MCHC: 33.2 g/dL (ref 30.0–36.0)
MCV: 85.3 fL (ref 80.0–100.0)
Monocytes Absolute: 1.2 10*3/uL — ABNORMAL HIGH (ref 0.1–1.0)
Monocytes Relative: 6 %
Neutro Abs: 18.5 10*3/uL — ABNORMAL HIGH (ref 1.7–7.7)
Neutrophils Relative %: 84 %
Platelets: 784 10*3/uL — ABNORMAL HIGH (ref 150–400)
RBC: 2.58 MIL/uL — ABNORMAL LOW (ref 4.22–5.81)
RDW: 14.3 % (ref 11.5–15.5)
WBC: 21.8 10*3/uL — ABNORMAL HIGH (ref 4.0–10.5)
nRBC: 0 % (ref 0.0–0.2)

## 2021-06-08 LAB — PROCALCITONIN: Procalcitonin: 0.8 ng/mL

## 2021-06-08 MED ORDER — CEFAZOLIN SODIUM-DEXTROSE 1-4 GM/50ML-% IV SOLN
1.0000 g | INTRAVENOUS | Status: DC
  Administered 2021-06-09: 1 g via INTRAVENOUS
  Filled 2021-06-08 (×2): qty 50

## 2021-06-08 MED ORDER — HEPARIN SODIUM (PORCINE) 1000 UNIT/ML IJ SOLN
INTRAMUSCULAR | Status: AC
  Filled 2021-06-08: qty 3

## 2021-06-08 NOTE — Progress Notes (Signed)
Inpatient Rehab Admissions Coordinator:   Pt. Is not yet medically ready for CIR as he continues to receive intermittent HD.  I have also  not yet been able to confirm a disposition for this Pt. I will call his friend to see what support he was able to identify later today.   Clemens Catholic, [place], [place] Admissions Coordinator  [phone] ([place]) [phone] (office)

## 2021-06-08 NOTE — Progress Notes (Signed)
PROGRESS NOTE    Andres Braun  HQI:[id number] DOB: 12-Feb-1976 DOA: 05/24/2021 PCP: Merryl Hacker, No   Chief Complaint  Patient presents with   Cardiac Arrest    Brief Narrative:   46 year old male admitted to ICU 1/15 under PCCM care after cardiac arrest at home.  PEA arrest, s/p CPR x20 minutes and epi x5.  Patient reportedly out drinking with his friends the night prior to admission.  Found by friends with agonal breathing.  Intubated on arrival, needed vasopressors, noted to have renal failure, rhabdomyolysis with CK in the 14 K range, treated for aspiration pneumonia, nephrology consulted and started on CRRT.  Extubated 1/19 and off pressors.  Mental status slowly improved.  Transferred to telemetry.  Care transferred to [facility] on 1/24.  Nephrology following for HD needs.  General surgery on board for questionable small bowel ischemia and taken emergently to surgery 1/24. The bowel in question was found to be thickened, but viable.  Pt seen and examined at bedside.he is working with PT.    Assessment & Plan:   Principal Problem:   Cardiac arrest with pulseless electrical activity ([place]) Active Problems:   Acute respiratory failure with hypoxia and hypercapnia (HCC)   Septic shock due to undetermined organism ([place])   AKI (acute kidney injury) ([place])   Acute metabolic encephalopathy   Aspiration pneumonia of both lower lobes due to vomit (HCC)   Protein-calorie malnutrition, severe   Acute hypoxic respiratory failure/aspiration pneumonia Patient intubated on 05/24/2021 and extubated on 05/28/2021 . Patient completed the course of antibiotics for aspiration pneumonia. Patient on RA.     Ischemic enteritis versus colitis with ileus General surgery consulted, there was concerned about an ischemic bowel.  Patient was taken to the OR for diagnostic laparoscopy, surgery revealed bowel in question is thickened,  no ischemia was seen.  hence no further intervention at this time.  Advance diet as  tolerated without any issues at this time. Diarrhea improved with imodium.  C DIFF PCR is negative.  GI panel is pending. Continue to monitor.     Lower extremity weakness with left foot drop:  -MRI of the neuraxis  was unremarkable for acute process .  As per the MRI of the lumbar spine, multiple fluid collections in the right ileo psoas muscle.  Dr Radene Gunning spoke with neurology and neuro surgery on 1/24, no surgical cause for his lower extremity weakness. He suggested that a psoas abscess or hematoma could cause lumbar plexus compression /irritation leading to secondary weakness.   IR consulted for aspiration, suggested any aspiration would predispose to infection. Did not advise aspiration.  MRI of the femur shows Diffuse muscular abnormalities throughout the visualized portions of both thighs with heterogeneous T2 signal and enhancement, especially within the left gluteus maximus and quadratus femoris muscles. Findings are compatible with the diagnosis of rhabdomyolysis, with the differential including myositis related to connective tissue disease and polymyositis. No drainable abscess identified. pt unable to move the left lower extremity from severe pain and weakness.  Orthopedics consulted, suggested its a neurological/ lumbar plexus ischemic injury. Hoping for recovery with therapy evaluations.  CK levels are improving. Vitamin b12 and folate levels wnl. ESR is 85, CRP is 5.4   Pt has good pulse in DP, no sensory deficits. No signs of compartment syndrome.  Pt reports his pain and burning sensation has improved with gabapentin.  Therapy evaluations recommending CIR.     Acute metabolic encephalopathy: Resolved. He is alert and answering questions appropriately.  Right anterior knee wound:  -wound care consulted.    Acute kidney injury with metabolic acidosis/rhabdomyolysis Patient has been off CRRT as of 05/28/2021. Nephrology on board and appreciate recommendations.  Hoping for  renal recovery, if no recovery will need a perm cath next week.   Improving CK levels.   Hyponatremia -Secondary to AKI   Severe protein calorie malnutrition. Supplementation added. Albumin around .1.8  Mechanical fall 1/24 and hit his head -As per nursing report, prior to going to the OR, patient attempted to get out of the bed, fell and hit his head without any obvious external injuries or LOC.  Patient's friend was in the room. Advised nursing regarding strict fall precautions, placement of safety sitter if needed and monitor closely.   Anemia of acute illness/  acute anemia of blood loss from hematoma in the psoas muscle  Hemoglobin on admission is around 15.9 to  12.6 to 11.8 to 10.5 to 9.9 to 8.7 to 8.2 to 7.5 Transfuse  to keep hemoglobin greater than 7. Iron deficiency on anemia panel. Will need supplementation on discharge.  Stool for occult blood is positive on 05/30/21. Suspect from colitis.  Hemoglobin around 7.3 today.    Elevated liver enzymes:  Probably from ETOH abuse, shock liver and rhabdomyolysis.  Much improved.    Leukocytosis:  Reactive or from muscle necrosis. Blood cultures are negative.  Procalcitonin is 0.8. on cefazolin for muscle necrosis. C diff pcr is negative. Gi panel is pending. Unable to get UA as pt is incontinent.  Requested ID to see if we need to continue antibiotics.    Thrombocytosis:  Reactive or from infection.   DVT prophylaxis: Heparin.  Code Status: (Full Code) Family Communication: none at bedside.  Disposition:   Status is: Inpatient  Remains inpatient appropriate because: unsafe d/c plan.        Consultants:  Nephrology Surgery.  Orthopedics Dr Dr Sammuel Hines. ID  PCCM Neurology.   Procedures:   Exploratory laparotomy Mechanical ventilation CRRT Hemodialysis   Antimicrobials: Antibiotics Given (last 72 hours)     Date/Time Action Medication Dose Rate   06/05/21 2147 New Bag/Given   ceFAZolin (ANCEF)  IVPB 1 g/50 mL premix 1 g 100 mL/hr   06/06/21 2137 New Bag/Given   ceFAZolin (ANCEF) IVPB 1 g/50 mL premix 1 g 100 mL/hr   06/07/21 2109 New Bag/Given   ceFAZolin (ANCEF) IVPB 1 g/50 mL premix 1 g 100 mL/hr         Subjective:  Burning pain is improving in the left leg.  No change in the strength in the left leg.  Right knee wound bandaged.   Objective: Vitals:   06/07/21 2150 06/08/21 0000 06/08/21 0200 06/08/21 0400  BP: (!) 161/87 128/64 (!) 141/84 (!) 150/89  Pulse: (!) 103 (!) 108 (!) 105 99  Resp: 15 19 15 12   Temp: 99.4 F (37.4 C) (!) 97.5 F (36.4 C)  (!) 97.5 F (36.4 C)  TempSrc: Oral Axillary  Axillary  SpO2: 97% 98% 100% 100%  Weight:    86.9 kg  Height:        Intake/Output Summary (Last 24 hours) at 06/08/2021 1152 Last data filed at 06/07/2021 1600 Gross per 24 hour  Intake 240 ml  Output 0 ml  Net 240 ml    Filed Weights   06/06/21 1228 06/07/21 0418 06/08/21 0400  Weight: 85.6 kg 86.9 kg 86.9 kg    Examination:  General exam: spanish speaking gentlemen well developed, not in distress.  Respiratory system: Clear to auscultation. Respiratory effort normal. Cardiovascular system: S1 & S2 heard, RRR. No JVD,   Gastrointestinal system: Abdomen is nondistended, soft and nontender. Normal bowel sounds heard. Central nervous system: Alert and oriented. Left lower extremity strength is 0/5. No sensory deficits. Burning sensation improving in the left leg.  Extremities: pedal edema improving.  Skin: No rashes,  Psychiatry: Mood & affect appropriate.         Data Reviewed: I have personally reviewed following labs and imaging studies  CBC: Recent Labs  Lab 06/03/21 0408 06/04/21 0248 06/06/21 0333 06/07/21 0222 06/08/21 1022  WBC 23.3* 21.9* 24.6* 24.1* 21.8*  NEUTROABS  --  17.2*  --   --  18.5*  HGB 8.0* 8.2* 7.4* 7.5* 7.3*  HCT 24.4* 23.4* 21.7* 22.1* 22.0*  MCV 83.6 81.3 82.5 84.4 85.3  PLT 457* 542* 678* 762* 784*     Basic  Metabolic Panel: Recent Labs  Lab 06/04/21 0248 06/05/21 0442 06/06/21 0333 06/07/21 0222 06/08/21 1022  NA 131* 132* 130* 129* 131*  K 4.6 4.0 4.4 4.4 4.2  CL 95* 95* 93* 93* 100  CO2 18* 23 22 24  20*  GLUCOSE 102* 105* 108* 107* 109*  BUN 156* 75* 95* 53* 65*  CREATININE 10.31* 6.67* 9.03* 6.60* 8.30*  CALCIUM 7.5* 7.4* 7.4* 7.6* 6.8*  PHOS >30.0* 9.3* 11.0* 7.9* 9.2*     GFR: Estimated Creatinine Clearance: 12.3 mL/min (A) (by C-G formula based on SCr of 8.3 mg/dL (H)).  Liver Function Tests: Recent Labs  Lab 06/02/21 1610 06/03/21 0408 06/04/21 0248 06/05/21 0442 06/06/21 0333 06/07/21 0222 06/08/21 1022  AST 77*  --  42*  --   --   --  25  ALT 173*  --  110*  --   --   --  16  ALKPHOS 158*  --  136*  --   --   --  148*  BILITOT 0.6  --  0.4  --   --   --  0.2*  PROT 5.0*  --  4.8*  --   --   --  5.3*  ALBUMIN 1.9*   < > 1.8* 1.8* 1.8* 1.8* 1.8*   1.6*   < > = values in this interval not displayed.     CBG: No results for input(s): GLUCAP in the last 168 hours.    Recent Results (from the past 240 hour(s))  Gastrointestinal Panel by PCR , Stool     Status: None   Collection Time: 06/01/21  5:27 AM   Specimen: Stool  Result Value Ref Range Status   Campylobacter species NOT DETECTED NOT DETECTED Final   Plesimonas shigelloides NOT DETECTED NOT DETECTED Final   Salmonella species NOT DETECTED NOT DETECTED Final   Yersinia enterocolitica NOT DETECTED NOT DETECTED Final   Vibrio species NOT DETECTED NOT DETECTED Final   Vibrio cholerae NOT DETECTED NOT DETECTED Final   Enteroaggregative E coli (EAEC) NOT DETECTED NOT DETECTED Final   Enteropathogenic E coli (EPEC) NOT DETECTED NOT DETECTED Final   Enterotoxigenic E coli (ETEC) NOT DETECTED NOT DETECTED Final   Shiga like toxin producing E coli (STEC) NOT DETECTED NOT DETECTED Final   Shigella/Enteroinvasive E coli (EIEC) NOT DETECTED NOT DETECTED Final   Cryptosporidium NOT DETECTED NOT DETECTED Final    Cyclospora cayetanensis NOT DETECTED NOT DETECTED Final   Entamoeba histolytica NOT DETECTED NOT DETECTED Final   Giardia lamblia NOT DETECTED NOT DETECTED Final   Adenovirus F40/41 NOT DETECTED  NOT DETECTED Final   Astrovirus NOT DETECTED NOT DETECTED Final   Norovirus GI/GII NOT DETECTED NOT DETECTED Final   Rotavirus A NOT DETECTED NOT DETECTED Final   Sapovirus (I, II, IV, and V) NOT DETECTED NOT DETECTED Final    Comment: Performed at [facility], [address], [place], [place] [address]  C Difficile Quick Screen (NO PCR Reflex)     Status: None   Collection Time: 06/07/21  8:38 PM   Specimen: STOOL  Result Value Ref Range Status   C Diff antigen NEGATIVE NEGATIVE Final   C Diff toxin NEGATIVE NEGATIVE Final   C Diff interpretation No C. difficile detected.  Final    Comment: Performed at [facility], [place] [address], [place],  [address]          Radiology Studies: No results found.      Scheduled Meds:  calcium acetate  1,334 mg Oral TID WC   Chlorhexidine Gluconate Cloth  6 each Topical Q0600   Chlorhexidine Gluconate Cloth  6 each Topical Q0600   Chlorhexidine Gluconate Cloth  6 each Topical Q0600   darbepoetin (ARANESP) injection - NON-DIALYSIS  100 mcg Subcutaneous Q Mon-1800   feeding supplement  237 mL Oral TID BM   folic acid  1 mg Per Tube Daily   gabapentin  100 mg Oral BID   Gerhardt's butt cream   Topical TID   heparin injection (subcutaneous)  5,000 Units Subcutaneous Q8H   multivitamin  1 tablet Oral QHS   pantoprazole  40 mg Oral BID   Continuous Infusions:  sodium chloride Stopped (05/29/21 1602)    ceFAZolin (ANCEF) IV       LOS: 15 days       Hosie Poisson, MD [facility]   To contact the attending provider between 7A-7P or the covering provider during after hours 7P-7A, please log into the web site [email or web address] and access using universal [place] password for that web site. If you do not have  the password, please call the hospital operator.  06/08/2021, 11:52 AM

## 2021-06-08 NOTE — Plan of Care (Signed)
°  Problem: Clinical Measurements: Goal: Will remain free from infection Outcome: Progressing   Problem: Safety: Goal: Ability to remain free from injury will improve Outcome: Progressing   Problem: Pain Managment: Goal: General experience of comfort will improve Outcome: Progressing

## 2021-06-08 NOTE — Progress Notes (Signed)
Physical Therapy Treatment Patient Details Name: Andres Braun MRN: [id number] DOB: [birth year] Today's Date: 06/08/2021   History of Present Illness Pt is a 46 y.o. male admitted 05/24/21 after cardiac arrest at home; pt with agonal breathing, lost pulse, required 20-min CPR and 5 rounds epi. Workup for acute hypoxic respiratory failure, septic shock due to undertermined organism, AKI, acute metabolic encephalopathy, aspiration PNA of bilateral lower lobes due to vomit. ETT 1/15-1/19. CRRT 1/15-1/20. Brain MRI 1/22 negative. Cervical MRI 1/22 with small R-side disc protrusion C5-6. Abdominal CT showed muscular swelling, particularly iliopsoas and gluteal muscles consistent with rhabdomyolysis; hemorrhage noted within R iliopsoas. S/p diagnostic laparoscopy 1/24. MRI L femur 1/25 showed diffuse muscular abnormalities bilateral thighs with heterogenous T2 signal, especially within L glut max and gradratus femoris; findings compatible with dx of rhabdomyolysis, no drainable abscess identified; these findings could contribute to compartment syndrome. Unknown PMH.    PT Comments    Patient progressing slowly towards PT goals. Pain seems slightly improved overall as well as movement of LLE however noted to have worsened pain in right knee.  Requires constant stimulus and tactile cues to active musculature in LLE. Able to active quads, hip IR/ER and hip abd/add against resistance or with tactile assist. Reports burning pain in bil thighs. Continues to have cognitive deficits impacting safety/awareness. Requires Mod A of 2 for standing using stedy; deferred transfer to chair today as pt not able to tolerate standing due to weakness in core/glutes and pain in right knee. Continue to recommend CIR. Will follow.   Recommendations for follow up therapy are one component of a multi-disciplinary discharge planning process, led by the attending physician.  Recommendations may be updated based on patient status,  additional functional criteria and insurance authorization.  Follow Up Recommendations  Acute inpatient rehab (3hours/day)     Assistance Recommended at Discharge Frequent or constant Supervision/Assistance  Patient can return home with the following Two people to help with walking and/or transfers;Help with stairs or ramp for entrance;A lot of help with bathing/dressing/bathroom;Assistance with cooking/housework;Assist for transportation;Direct supervision/assist for financial management;Direct supervision/assist for medications management;A lot of help with walking and/or transfers   Equipment Recommendations  Wheelchair (measurements PT);Wheelchair cushion (measurements PT);Other (comment);Hospital bed;BSC/3in1    Recommendations for Other Services       Precautions / Restrictions Precautions Precautions: Fall Restrictions Weight Bearing Restrictions: No     Mobility  Bed Mobility Overal bed mobility: Needs Assistance Bed Mobility: Rolling, Supine to Sit, Sit to Supine Rolling: Max assist Sidelying to sit: Mod assist, +2 for safety/equipment, HOB elevated   Sit to supine: +2 for physical assistance, Mod assist   General bed mobility comments: pt requires (A) to lift R LE to roll toward L side. Pt with pain with tactile input. Pt requires (A) to elevate trunk and bring bil LE off bed. pt maintaining hip extension as much as possible when staff attempting hip flexion for transfers. pt static sitting with bil UE suppport lateral lean toward R. pt requires 1 step commands    Transfers Overall transfer level: Needs assistance Equipment used: Ambulation equipment used Transfers: Sit to/from Stand Sit to Stand: +2 physical assistance, +2 safety/equipment, From elevated surface, Mod assist           General transfer comment: pt requires (A) to come to EOB static sitting. pt with (A) to place LLE on machine and initiates R LE. pt reports pain immediately. pt unable to sustain  multiple sit <>Stand this session due ot R  LE pain. pt with pillow at R shin and still with decreased tolerance. Falls back on stedy seat immediately, poor glute strength to hold self upright. Stood from [employer], from stedy seat x2 with cues for technique. Transfer via Lift Equipment: Stedy  Ambulation/Gait               General Gait Details: Company secretary Rank[employer] (Stroke Patients Only)       Balance Overall balance assessment: Needs assistance Sitting-balance support: Feet supported, Bilateral upper extremity supported Sitting balance-Leahy Scale: Fair Sitting balance - Comments: Posterior lean sitting EOB with limited hip flexion BLEs. Postural control: Posterior lean Standing balance support: During functional activity Standing balance-Leahy Scale: Zero Standing balance comment: Not able to stand upright in stedy due to pain in right knee/weakness bil hips/glutes.                            Cognition Arousal/Alertness: Awake/alert Behavior During Therapy: Flat affect Overall Cognitive Status: Impaired/Different from baseline Area of Impairment: Orientation, Attention, Memory, Following commands, Safety/judgement, Awareness, Problem solving                 Orientation Level: Disoriented to, Time, Situation Current Attention Level: Sustained Memory: Decreased recall of precautions, Decreased short-term memory Following Commands: Follows one step commands with increased time Safety/Judgement: Decreased awareness of safety, Decreased awareness of deficits Awareness: Intellectual Problem Solving: Slow processing, Difficulty sequencing General Comments: pt observed calling for help and wanting water. Pt showing poor awareness to safety due to deficits.        Exercises General Exercises - Lower Extremity Ankle Circles/Pumps: AROM, PROM, Both, 10 reps, Supine (unable to active left ankle) Quad Sets:  AROM, Both, 10 reps, Supine Heel Slides: AAROM, Both, 10 reps, Supine Hip ABduction/ADduction: AROM, Both, 10 reps, Supine (squeezing pillow between knees and pushing against therapist's hands for abduction) Other Exercises Other Exercises: passive knee flexion very painful on R today. Worked on hip IR/ER with cues to "touch toes together"    General Comments General comments (skin integrity, edema, etc.): HR up to 125 bpm during session. Graciela present.      Pertinent Vitals/Pain Pain Assessment Pain Assessment: Faces Faces Pain Scale: Hurts whole lot Pain Location: R knee area - noted to have drainage at this time. it appears a blister could have burst prior to session Pain Descriptors / Indicators: Burning, Numbness, Moaning Pain Intervention(s): Premedicated before session, Monitored during session, Limited activity within patient's tolerance, Repositioned    Home Living                          Prior Function            PT Goals (current goals can now be found in the care plan section) Progress towards PT goals: Progressing toward goals (slowly)    Frequency    Min 3X/week      PT Plan Current plan remains appropriate    Co-evaluation PT/OT/SLP Co-Evaluation/Treatment: Yes Reason for Co-Treatment: For patient/therapist safety;To address functional/ADL transfers;Complexity of the patient's impairments (multi-system involvement);Necessary to address cognition/behavior during functional activity PT goals addressed during session: Mobility/safety with mobility;Balance;Strengthening/ROM OT goals addressed during session: ADL's and self-care;Proper use of Adaptive equipment and DME;Strengthening/ROM      AM-PAC PT "6 Clicks" Mobility   Outcome Measure  Help needed turning from your back to your side while in a flat bed without using bedrails?: A Lot Help needed moving from lying on your back to sitting on the side of a flat bed without using bedrails?: A  Lot Help needed moving to and from a bed to a chair (including a wheelchair)?: Total Help needed standing up from a chair using your arms (e.g., wheelchair or bedside chair)?: Total Help needed to walk in hospital room?: Total Help needed climbing 3-5 steps with a railing? : Total 6 Click Score: 8    End of Session Equipment Utilized During Treatment: Gait belt Activity Tolerance: Patient tolerated treatment well;Patient limited by pain Patient left: in bed;with call bell/phone within reach;with bed alarm set Nurse Communication: Mobility status PT Visit Diagnosis: Other abnormalities of gait and mobility (R26.89);Muscle weakness (generalized) (M62.81);Difficulty in walking, not elsewhere classified (R26.2);Unsteadiness on feet (R26.81)     Time: [id number] PT Time Calculation (min) (ACUTE ONLY): 38 min  Charges:  $Therapeutic Exercise: 8-22 mins $Therapeutic Activity: 8-22 mins                     Vale HavenShauna H, PT, DPT Acute Rehabilitation Services Pager [phone] Office [phone]      Blake DivineShauna A Theona Muhs 06/08/2021, 2:00 PM

## 2021-06-08 NOTE — Progress Notes (Signed)
[facility]   Reason for visit: Follow up on myositis, leukocytosis   Interval History: no acute events; remains afebrile, WBC 21.8.  no associated rash or diarrhea.   Physical Exam: Constitutional:  Vitals:   06/08/21 0200 06/08/21 0400  BP: (!) 141/84 (!) 150/89  Pulse: (!) 105 99  Resp: 15 12  Temp:  (!) 97.5 F (36.4 C)  SpO2: 100% 100%   patient appears in NAD Respiratory: Normal respiratory effort; CTA B Cardiovascular: RRR GI: soft, nt, nd  Review of Systems: Constitutional: negative for fevers and chills Gastrointestinal: negative for nausea and diarrhea  Lab Results  Component Value Date   WBC 21.8 (H) 06/08/2021   HGB 7.3 (L) 06/08/2021   HCT 22.0 (L) 06/08/2021   MCV 85.3 06/08/2021   PLT 784 (H) 06/08/2021    Lab Results  Component Value Date   CREATININE 8.30 (H) 06/08/2021   BUN 65 (H) 06/08/2021   NA 131 (L) 06/08/2021   K 4.2 06/08/2021   CL 100 06/08/2021   CO2 20 (L) 06/08/2021    Lab Results  Component Value Date   ALT 16 06/08/2021   AST 25 06/08/2021   ALKPHOS 148 (H) 06/08/2021     Microbiology: Recent Results (from the past 240 hour(s))  Gastrointestinal Panel by PCR , Stool     Status: None   Collection Time: 06/01/21  5:27 AM   Specimen: Stool  Result Value Ref Range Status   Campylobacter species NOT DETECTED NOT DETECTED Final   Plesimonas shigelloides NOT DETECTED NOT DETECTED Final   Salmonella species NOT DETECTED NOT DETECTED Final   Yersinia enterocolitica NOT DETECTED NOT DETECTED Final   Vibrio species NOT DETECTED NOT DETECTED Final   Vibrio cholerae NOT DETECTED NOT DETECTED Final   Enteroaggregative E coli (EAEC) NOT DETECTED NOT DETECTED Final   Enteropathogenic E coli (EPEC) NOT DETECTED NOT DETECTED Final   Enterotoxigenic E coli (ETEC) NOT DETECTED NOT DETECTED Final   Shiga like toxin producing E coli (STEC) NOT DETECTED NOT DETECTED Final   Shigella/Enteroinvasive E coli (EIEC)  NOT DETECTED NOT DETECTED Final   Cryptosporidium NOT DETECTED NOT DETECTED Final   Cyclospora cayetanensis NOT DETECTED NOT DETECTED Final   Entamoeba histolytica NOT DETECTED NOT DETECTED Final   Giardia lamblia NOT DETECTED NOT DETECTED Final   Adenovirus F40/41 NOT DETECTED NOT DETECTED Final   Astrovirus NOT DETECTED NOT DETECTED Final   Norovirus GI/GII NOT DETECTED NOT DETECTED Final   Rotavirus A NOT DETECTED NOT DETECTED Final   Sapovirus (I, II, IV, and V) NOT DETECTED NOT DETECTED Final    Comment: Performed at [facility], [address] [place], [place] [address]  C Difficile Quick Screen (NO PCR Reflex)     Status: None   Collection Time: 06/07/21  8:38 PM   Specimen: STOOL  Result Value Ref Range Status   C Diff antigen NEGATIVE NEGATIVE Final   C Diff toxin NEGATIVE NEGATIVE Final   C Diff interpretation No C. difficile detected.  Final    Comment: Performed at [facility], [place] [address], [place], [place] [address]    Impression/Plan:  1. Left leg paralysis - he is unable to move his leg at all down his leg.  No movement in his upper, lower leg or toes.  Does move his right leg and toes ok.  I am concerned of an underlying process and I will repeat the MRI lumbar spine.  Previous lumbar MRI with motion degradation.  2.  Leukocytosis - some improvement today but still with concern of ongoing source control with findings of #1 and paralysis of the leg.    3.  Rhabdomyolysis - CK down to 432 most recently from a peak of over 50,000.

## 2021-06-08 NOTE — Progress Notes (Signed)
Occupational Therapy Treatment Patient Details Name: Andres Braun MRN: [id number] DOB: 1975/08/27 Today's Date: 06/08/2021   History of present illness Pt is a 46 y.o. male admitted 05/24/21 after cardiac arrest at home; pt with agonal breathing, lost pulse, required 20-min CPR and 5 rounds epi. Workup for acute hypoxic respiratory failure, septic shock due to undertermined organism, AKI, acute metabolic encephalopathy, aspiration PNA of bilateral lower lobes due to vomit. ETT 1/15-1/19. CRRT 1/15-1/20. Brain MRI 1/22 negative. Cervical MRI 1/22 with small R-side disc protrusion C5-6. Abdominal CT showed muscular swelling, particularly iliopsoas and gluteal muscles consistent with rhabdomyolysis; hemorrhage noted within R iliopsoas. S/p diagnostic laparoscopy 1/24. MRI L femur 1/25 showed diffuse muscular abnormalities bilateral thighs with heterogenous T2 signal, especially within L glut max and gradratus femoris; findings compatible with dx of rhabdomyolysis, no drainable abscess identified; these findings could contribute to compartment syndrome. Unknown PMH.   OT comments  Pt progressed from supine to sit into the stedy today but with increased pain in R LE limiting progression to chair static sitting. Pt remained in chair position in the bed for safety of the patient. Pt unable to tolerate static standing and very impulsively sitting in stedy. Recommendation for frequent skin checks due to incontinence of bowel and bladder without awareness. Recommendation for CIR at this time.     Recommendations for follow up therapy are one component of a multi-disciplinary discharge planning process, led by the attending physician.  Recommendations may be updated based on patient status, additional functional criteria and insurance authorization.    Follow Up Recommendations  Acute inpatient rehab (3hours/day)    Assistance Recommended at Discharge Frequent or constant Supervision/Assistance  Patient can  return home with the following  Two people to help with walking and/or transfers;Two people to help with bathing/dressing/bathroom;Assistance with cooking/housework;Assist for transportation;Direct supervision/assist for medications management;Help with stairs or ramp for entrance   Equipment Recommendations  BSC/3in1;Wheelchair (measurements OT);Wheelchair cushion (measurements OT)    Recommendations for Other Services Rehab consult    Precautions / Restrictions Precautions Precautions: Fall       Mobility Bed Mobility Overal bed mobility: Needs Assistance Bed Mobility: Rolling, Supine to Sit, Sit to Supine Rolling: Max assist   Supine to sit: +2 for physical assistance, Mod assist Sit to supine: +2 for physical assistance, Mod assist   General bed mobility comments: pt requires (A) to lift R LE to roll toward L side. Pt with pain with tactile input. Pt requires (A) to elevate trunk and bring bil LE off bed. pt maintaining hip extension as much as possible when staff attempting hip flexion for transfers. pt static sitting with bil UE suppport lateral lean toward R. pt requires 1 step coomands    Transfers Overall transfer level: Needs assistance   Transfers: Sit to/from Stand Sit to Stand: +2 physical assistance, +2 safety/equipment, From elevated surface, Mod assist           General transfer comment: pt requires (A) to come to EOB static sitting. pt with (A) to place LLE on machine and initiates R LE. pt reports pain immediately. pt unable to sustain multiple sit <>Stand this session due ot R LE pain. pt with pillow at R shin and still with decreased tolerance. Transfer via Lift Equipment: Stedy   Balance Overall balance assessment: Needs assistance Sitting-balance support: Bilateral upper extremity supported Sitting balance-Leahy Scale: Fair     Standing balance support: Bilateral upper extremity supported Standing balance-Leahy Scale: Zero Standing balance comment:  pushing back  off the stedy machine showing decreased safety with transfer. requires return to supine                           ADL either performed or assessed with clinical judgement   ADL Overall ADL's : Needs assistance/impaired Eating/Feeding: Supervision/ safety;Bed level Eating/Feeding Details (indicate cue type and reason): HOB increased to chair position . pt declines food but provided chicken broth                           Toileting - Clothing Manipulation Details (indicate cue type and reason): incontinence of bowel and bladder- pt not asking for help on staff arrival. pt does not alert staff but rather staff starting discussion with patient. CNA staff report that patient only calls them after its occurred not prior.            Extremity/Trunk Assessment Upper Extremity Assessment LUE Deficits / Details: showing activation of quad at this time with max tacitle/ visual cues   Lower Extremity Assessment RLE Deficits / Details: reports increased pain at the knee more than LLE this session        Vision       Perception Perception Perception: Impaired   Praxis      Cognition Arousal/Alertness: Awake/alert Behavior During Therapy: Flat affect Overall Cognitive Status: Impaired/Different from baseline Area of Impairment: Orientation, Attention, Memory, Following commands, Safety/judgement, Awareness, Problem solving                 Orientation Level: Disoriented to, Time, Situation (reports 12 days which was the education provided on friday so showing recall but when educated two days have passed reports 24) Current Attention Level: Sustained Memory: Decreased recall of precautions, Decreased short-term memory Following Commands: Follows one step commands with increased time Safety/Judgement: Decreased awareness of safety, Decreased awareness of deficits Awareness: Intellectual Problem Solving: Slow processing, Difficulty  sequencing General Comments: pt observed calling for help and wanting water. Pt showing poor awareness to safety due to deficits.        Exercises Exercises: Other exercises Other Exercises Other Exercises: visual cues requires for activation of all exercises multiomodal cues supine Other Exercises: knee flexion very painful on R today. worked on Therapist, occupationalinternal rotators, ankle flexion,abduction of hip Other Exercises: AROM bip internal rotation >15 reps supine    Shoulder Instructions       General Comments HR 125 during session. RA , Graciela present.    Pertinent Vitals/ Pain       Pain Assessment Pain Assessment: Faces Faces Pain Scale: Hurts whole lot Pain Location: R knee area - noted to have drainage at this time. it appears a blister could have burst prior to session Pain Descriptors / Indicators: Burning, Numbness, Moaning Pain Intervention(s): Monitored during session, Premedicated before session, Repositioned  Home Living                                          Prior Functioning/Environment              Frequency  Min 2X/week        Progress Toward Goals  OT Goals(current goals can now be found in the care plan section)  Progress towards OT goals: Progressing toward goals  Acute Rehab OT Goals Patient Stated Goal: reporting pain at R  knee. OT Goal Formulation: With patient Time For Goal Achievement: 06/12/21 Potential to Achieve Goals: Good ADL Goals Pt Will Perform Grooming: with set-up;with supervision;sitting Pt Will Perform Upper Body Bathing: with set-up;with supervision;sitting Pt Will Perform Lower Body Bathing: with min assist;sit to/from stand Pt Will Transfer to Toilet: with min assist;stand pivot transfer;bedside commode Additional ADL Goal #1: Pt will be Min A in and OOB for basic ADLs Additional ADL Goal #2: continue to assess vision Additional ADL Goal #3: Pt will follow 75% of one step commands on first try  Plan  Discharge plan remains appropriate    Co-evaluation    PT/OT/SLP Co-Evaluation/Treatment: Yes Reason for Co-Treatment: Complexity of the patient's impairments (multi-system involvement);Necessary to address cognition/behavior during functional activity;For patient/therapist safety;To address functional/ADL transfers   OT goals addressed during session: ADL's and self-care;Proper use of Adaptive equipment and DME;Strengthening/ROM      AM-PAC OT "6 Clicks" Daily Activity     Outcome Measure   Help from another person eating meals?: A Little Help from another person taking care of personal grooming?: A Little Help from another person toileting, which includes using toliet, bedpan, or urinal?: A Lot Help from another person bathing (including washing, rinsing, drying)?: A Lot Help from another person to put on and taking off regular upper body clothing?: A Little Help from another person to put on and taking off regular lower body clothing?: Total 6 Click Score: 14    End of Session Equipment Utilized During Treatment: Gait belt  OT Visit Diagnosis: Unsteadiness on feet (R26.81);Other abnormalities of gait and mobility (R26.89);Muscle weakness (generalized) (M62.81);Other symptoms and signs involving cognitive function;Pain Pain - Right/Left: Left Pain - part of body: Leg   Activity Tolerance Patient tolerated treatment well   Patient Left in bed;with call bell/phone within reach;with bed alarm set;with nursing/sitter in room   Nurse Communication Mobility status;Precautions        Time: 1100-1139 OT Time Calculation (min): 39 min  Charges: OT General Charges $OT Visit: 1 Visit OT Treatments $Self Care/Home Management : 23-37 mins   Brynn, OTR/L  Acute Rehabilitation Services Pager: [phone] Office: [phone] .   Mateo Flow 06/08/2021, 12:17 PM

## 2021-06-08 NOTE — Plan of Care (Signed)
  Problem: Clinical Measurements: Goal: Will remain free from infection Outcome: Progressing   Problem: Nutrition: Goal: Adequate nutrition will be maintained Outcome: Progressing   Problem: Safety: Goal: Ability to remain free from injury will improve Outcome: Progressing   Problem: Skin Integrity: Goal: Risk for impaired skin integrity will decrease Outcome: Progressing   

## 2021-06-08 NOTE — Progress Notes (Signed)
[facility] Progress Note   Subjective:   Planning for dialysis today.  No labs back yet.  We will follow-up labs before dialysis.  Patient states he continues to urinate but is incontinent.  None documented.  Patient thinks he urinated about 6 times yesterday.  Objective Vitals:   06/07/21 2150 06/08/21 0000 06/08/21 0200 06/08/21 0400  BP: (!) 161/87 128/64 (!) 141/84 (!) 150/89  Pulse: (!) 103 (!) 108 (!) 105 99  Resp: 15 19 15 12   Temp: 99.4 F (37.4 C) (!) 97.5 F (36.4 C)  (!) 97.5 F (36.4 C)  TempSrc: Oral Axillary  Axillary  SpO2: 97% 98% 100% 100%  Weight:    86.9 kg  Height:       Physical Exam General: Sitting in bed, no distress Heart: Tachycardic, 1+ pitting edema in the bilateral lower extremities Lungs: Bilateral chest rise with no increased work of breathing Abdomen: soft, mildly distended Extremities: Warm and well perfused Neuro:  awake and alert, oriented to person and place Dialysis Access:  R IJ  temp HD catheter -  placed 1/23  Additional Objective Labs: Basic Metabolic Panel: Recent Labs  Lab 06/05/21 0442 06/06/21 0333 06/07/21 0222  NA 132* 130* 129*  K 4.0 4.4 4.4  CL 95* 93* 93*  CO2 23 22 24   GLUCOSE 105* 108* 107*  BUN 75* 95* 53*  CREATININE 6.67* 9.03* 6.60*  CALCIUM 7.4* 7.4* 7.6*  PHOS 9.3* 11.0* 7.9*   Liver Function Tests: Recent Labs  Lab 06/02/21 1610 06/03/21 0408 06/04/21 0248 06/05/21 0442 06/06/21 0333 06/07/21 0222  AST 77*  --  42*  --   --   --   ALT 173*  --  110*  --   --   --   ALKPHOS 158*  --  136*  --   --   --   BILITOT 0.6  --  0.4  --   --   --   PROT 5.0*  --  4.8*  --   --   --   ALBUMIN 1.9*   < > 1.8* 1.8* 1.8* 1.8*   < > = values in this interval not displayed.   No results for input(s): LIPASE, AMYLASE in the last 168 hours. CBC: Recent Labs  Lab 06/02/21 1610 06/03/21 0408 06/04/21 0248 06/06/21 0333 06/07/21 0222  WBC 28.0* 23.3* 21.9* 24.6* 24.1*  NEUTROABS  --    --  17.2*  --   --   HGB 8.7* 8.0* 8.2* 7.4* 7.5*  HCT 26.2* 24.4* 23.4* 21.7* 22.1*  MCV 83.2 83.6 81.3 82.5 84.4  PLT 467* 457* 542* 678* 762*   Blood Culture No results found for: SDES, SPECREQUEST, CULT, REPTSTATUS  Cardiac Enzymes: Recent Labs  Lab 06/02/21 1610 06/03/21 0408 06/04/21 0248 06/05/21 0442 06/06/21 0333  CKTOTAL 2,119* 1,166* 783* 462* 432*   CBG: No results for input(s): GLUCAP in the last 168 hours.  Iron Studies:  Recent Labs    06/05/21 1512  IRON 23*  TIBC 197*  FERRITIN 276    @lablastinr3 @ Studies/Results: No results found. Medications:  sodium chloride Stopped (05/29/21 1602)    calcium acetate  1,334 mg Oral TID WC   Chlorhexidine Gluconate Cloth  6 each Topical Q0600   Chlorhexidine Gluconate Cloth  6 each Topical Q0600   Chlorhexidine Gluconate Cloth  6 each Topical Q0600   darbepoetin (ARANESP) injection - NON-DIALYSIS  100 mcg Subcutaneous Q Mon-1800   feeding supplement  237 mL Oral TID  BM   folic acid  1 mg Per Tube Daily   gabapentin  100 mg Oral BID   Gerhardt's butt cream   Topical TID   heparin injection (subcutaneous)  5,000 Units Subcutaneous Q8H   multivitamin  1 tablet Oral QHS   pantoprazole  40 mg Oral BID    Assessment/ Plan: AKI severe: Baseline creatinine possibly around 2.7.  In setting of cardiac arrest, rhabdo.  Oliguric to anuric.  Required CRRT 1/15 - 1/19.   And intermittent hemodialysis most recently on 1/28.  Plan for HD today and f/u labs prior to HD given he feels like he is urinating more. Plan for [facility] and possibly AVF creation based on progress. SP cardiac arrest - asystolic arrest in setting of prob asp PNA/ ARDS in setting of EtOH Severe metabolic/ resp acidosis -   now resolved LE neurologic deficits - ortho and neuro as well as NSG seeing-  thought is fluid in psoas muscle could be causing some compression-    Possible ischemic bowel - surgery consulting-  ex lap this past week did not show ischemic  bowel needing resection but with some edema in bowel Anemia- hgb in the 8's but slowly drifting down with events -    iron stores low but defer iron right now given high WBC- giving ESA -  transfuse PRN  Bones-  phos very high- now on phoslo. F/u repeat   Darnell Level  06/08/2021, 9:58 AM  [employer]

## 2021-06-09 ENCOUNTER — Inpatient Hospital Stay (HOSPITAL_COMMUNITY): Payer: Self-pay

## 2021-06-09 LAB — CBC WITH DIFFERENTIAL/PLATELET
Abs Immature Granulocytes: 0.37 10*3/uL — ABNORMAL HIGH (ref 0.00–0.07)
Basophils Absolute: 0.1 10*3/uL (ref 0.0–0.1)
Basophils Relative: 0 %
Eosinophils Absolute: 0.2 10*3/uL (ref 0.0–0.5)
Eosinophils Relative: 1 %
HCT: 20.4 % — ABNORMAL LOW (ref 39.0–52.0)
Hemoglobin: 6.8 g/dL — CL (ref 13.0–17.0)
Immature Granulocytes: 2 %
Lymphocytes Relative: 8 %
Lymphs Abs: 1.6 10*3/uL (ref 0.7–4.0)
MCH: 28.2 pg (ref 26.0–34.0)
MCHC: 33.3 g/dL (ref 30.0–36.0)
MCV: 84.6 fL (ref 80.0–100.0)
Monocytes Absolute: 1.4 10*3/uL — ABNORMAL HIGH (ref 0.1–1.0)
Monocytes Relative: 7 %
Neutro Abs: 15.9 10*3/uL — ABNORMAL HIGH (ref 1.7–7.7)
Neutrophils Relative %: 82 %
Platelets: 700 10*3/uL — ABNORMAL HIGH (ref 150–400)
RBC: 2.41 MIL/uL — ABNORMAL LOW (ref 4.22–5.81)
RDW: 14.3 % (ref 11.5–15.5)
WBC: 19.5 10*3/uL — ABNORMAL HIGH (ref 4.0–10.5)
nRBC: 0 % (ref 0.0–0.2)

## 2021-06-09 LAB — HEMOGLOBIN AND HEMATOCRIT, BLOOD
HCT: 23.6 % — ABNORMAL LOW (ref 39.0–52.0)
Hemoglobin: 7.7 g/dL — ABNORMAL LOW (ref 13.0–17.0)

## 2021-06-09 LAB — RENAL FUNCTION PANEL
Albumin: 1.8 g/dL — ABNORMAL LOW (ref 3.5–5.0)
Anion gap: 15 (ref 5–15)
BUN: 46 mg/dL — ABNORMAL HIGH (ref 6–20)
CO2: 20 mmol/L — ABNORMAL LOW (ref 22–32)
Calcium: 7.6 mg/dL — ABNORMAL LOW (ref 8.9–10.3)
Chloride: 93 mmol/L — ABNORMAL LOW (ref 98–111)
Creatinine, Ser: 7.08 mg/dL — ABNORMAL HIGH (ref 0.61–1.24)
GFR, Estimated: 9 mL/min — ABNORMAL LOW (ref >60)
Glucose, Bld: 123 mg/dL — ABNORMAL HIGH (ref 70–99)
Phosphorus: 7.7 mg/dL — ABNORMAL HIGH (ref 2.5–4.6)
Potassium: 4.9 mmol/L (ref 3.5–5.1)
Sodium: 128 mmol/L — ABNORMAL LOW (ref 135–145)

## 2021-06-09 LAB — VITAMIN B1: Vitamin B1 (Thiamine): 232.4 nmol/L — ABNORMAL HIGH (ref 66.5–200.0)

## 2021-06-09 LAB — PREPARE RBC (CROSSMATCH)

## 2021-06-09 IMAGING — MR MR LUMBAR SPINE W/O CM
4 of 5 series · 26 of 48 positions shown · non-contrast
Comparison: [DATE]

CLINICAL DATA: Left leg complete paralysis

EXAM:
MRI LUMBAR SPINE WITHOUT CONTRAST
TECHNIQUE: Multiplanar, multisequence MR imaging of the lumbar spine was
performed. No intravenous contrast was administered.

[Series 5: T2 · sagittal · 4.0mm · 0.73mm/px · 6 of 16 slices shown (1 of 2)]
[im 1/16]
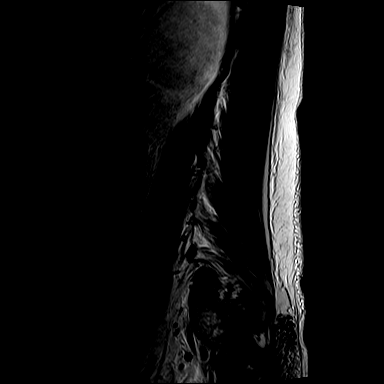
[im 4/16]
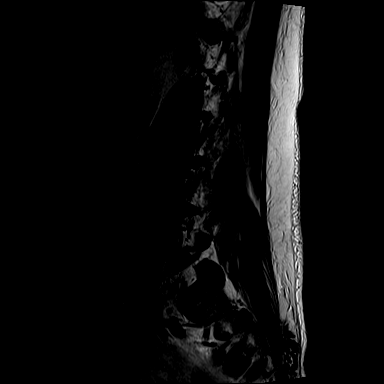
[im 7/16]
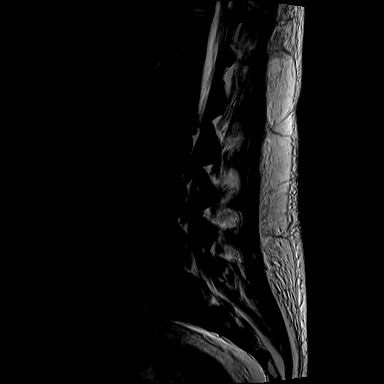
[im 10/16]
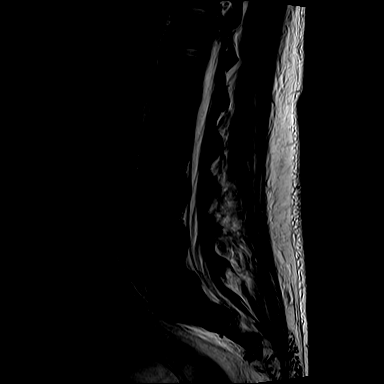
[im 13/16]
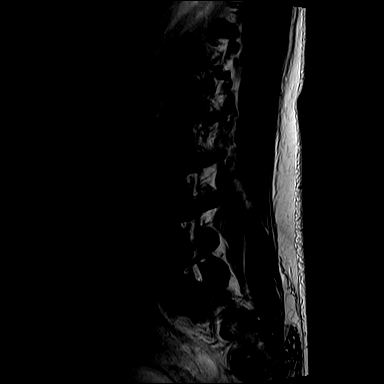
[im 16/16]
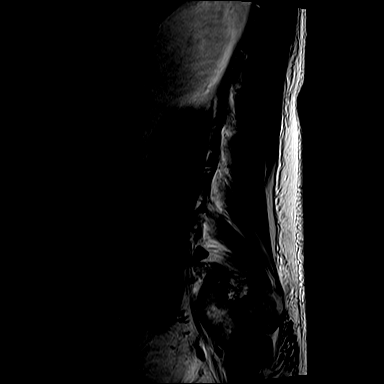

[Series 7: T1 · sagittal · 4.0mm · 0.88mm/px · 6 of 16 slices shown (1 of 2)]
[im 1/16]
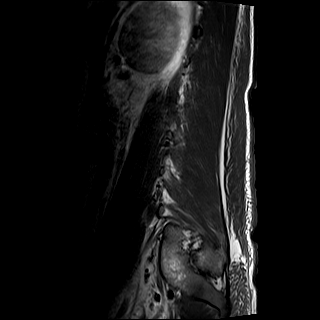
[im 4/16]
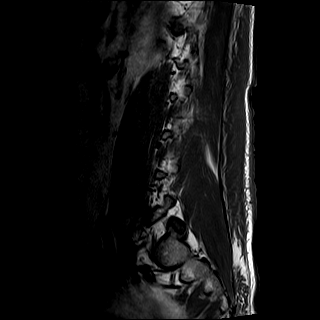
[im 7/16]
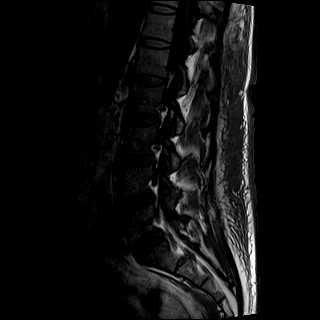
[im 10/16]
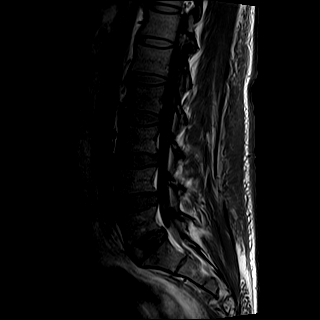
[im 13/16]
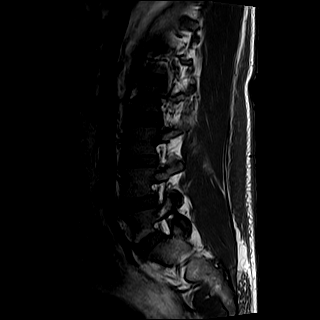
[im 16/16]
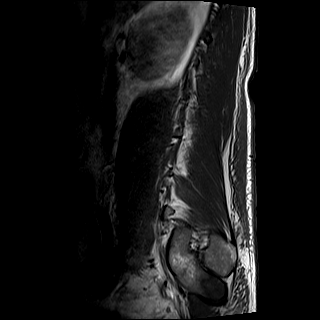

[Series 8: T2 · axial · 4.0mm · 0.57mm/px · z∈[-109,+106]mm · 9 of 37 slices shown (2 of 2)]
[im 1/37]
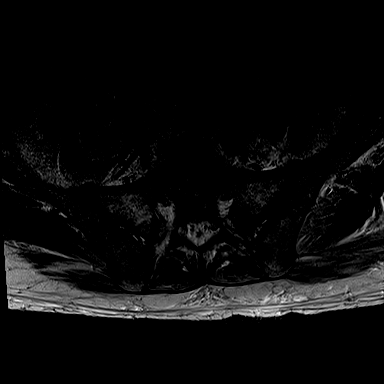
[im 6/37]
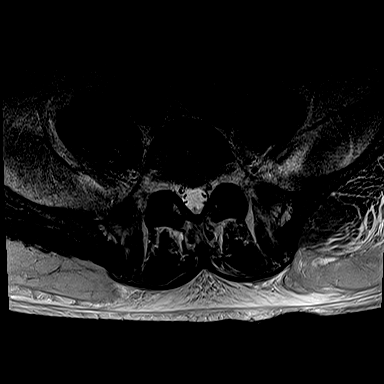
[im 11/37]
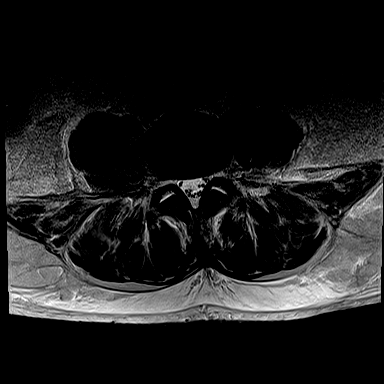
[im 16/37]
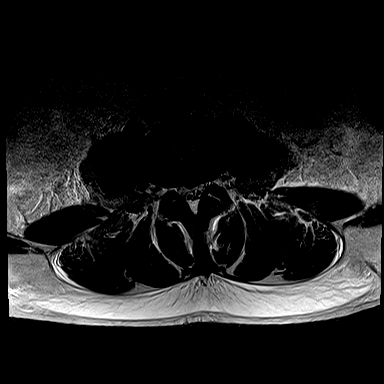
[im 19/37]
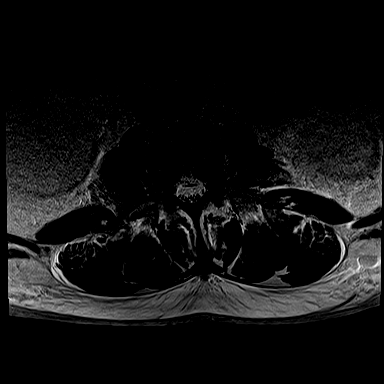
[im 21/37]
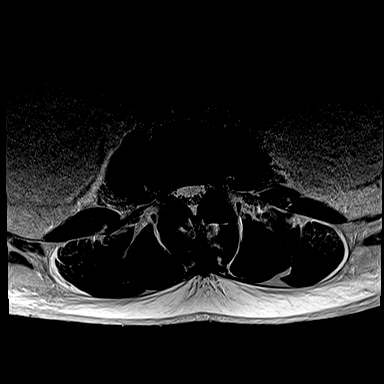
[im 26/37]
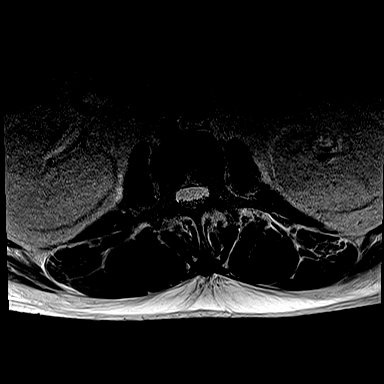
[im 31/37]
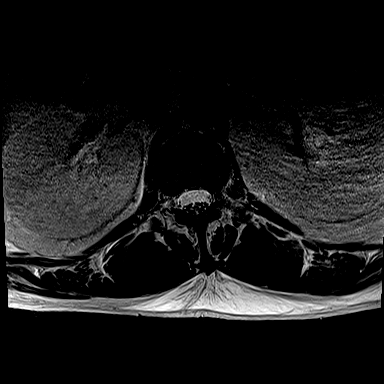
[im 37/37]
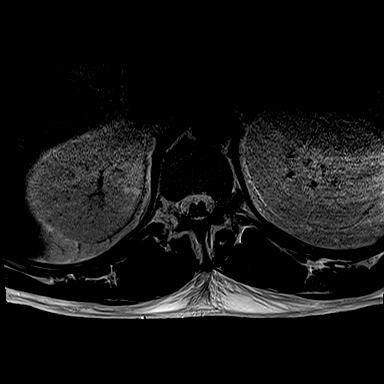

[Series 9: T1 · axial · 4.0mm · 0.34mm/px · z∈[-109,+76]mm · 5 of 37 slices shown (2 of 2)]
[im 1/37]
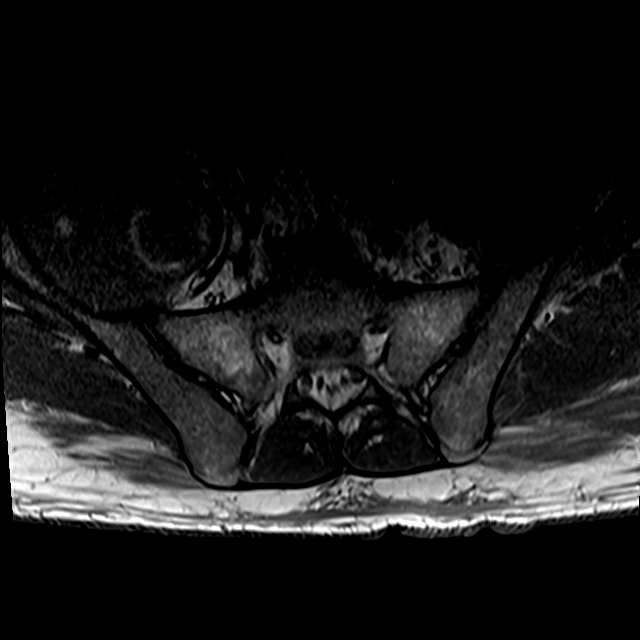
[im 6/37]
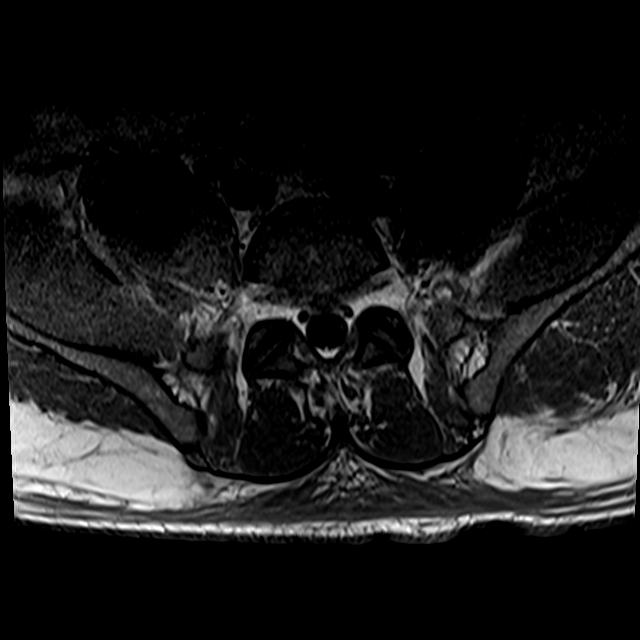
[im 11/37]
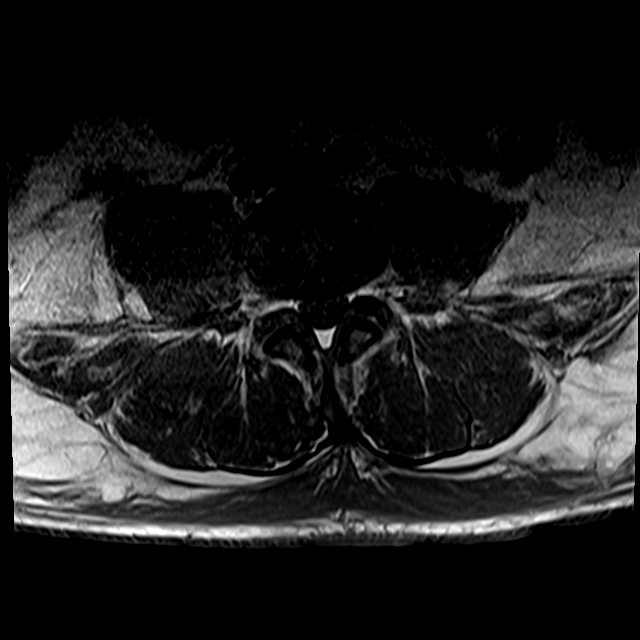
[im 19/37]
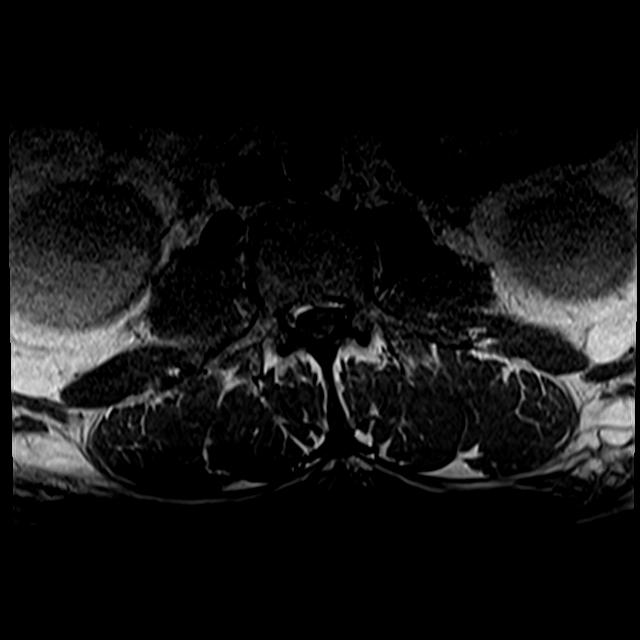
[im 31/37]
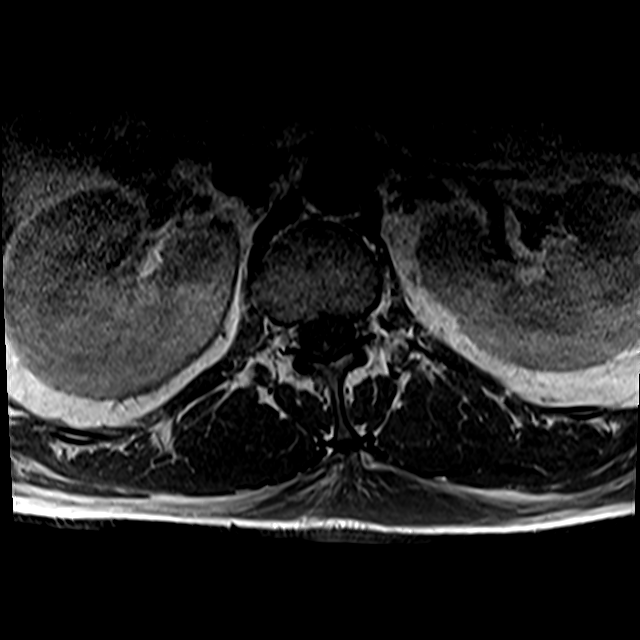

[26 of 48 positions shown; findings below may reference images not displayed]

FINDINGS: Segmentation:  Standard.

Alignment:  Physiologic.

Vertebrae: No acute fracture or suspicious osseous lesion. No
evidence of discitis.

Conus medullaris and cauda equina: Conus extends to the L1-L2 level.
Conus and cauda equina appear normal.

Paraspinal and other soft tissues: Redemonstrated enlargement of the
right iliopsoas muscle with a complex fluid collection, which is
incompletely imaged but measures up to 3.3 x 2.6 cm in the axial
plane, previously 3.4 x 2.6 cm when remeasured similarly. This fluid
collection is now T1 isointense with a T1 hyperintense rim and T2
hyperintense with a T2 hypointense rim, previously T1 and T2
hypointense. Multiple smaller collections are also noted, the most
prominent of which measures up to 0.7 cm (series 8, image 37),
likely unchanged in size, with signal characteristics similar to the
larger collection.

Disc levels:

T12-L1: No significant disc bulge. No spinal canal stenosis or
neural foraminal narrowing.

L1-L2: No significant disc bulge. No spinal canal stenosis or neural
foraminal narrowing.

L2-L3: No significant disc bulge. No spinal canal stenosis or neural
foraminal narrowing.

L3-L4: Small central disc protrusion. Mild facet arthropathy.
Narrowing of the lateral recesses. No spinal canal stenosis or
neural foraminal narrowing.

L4-L5: Mild disc bulge. Mild facet arthropathy. No spinal canal
stenosis or neural foraminal narrowing.

L5-S1: Mild disc bulge with central annular fissure. No spinal canal
stenosis or neural foraminal narrowing.
IMPRESSION: 1. Redemonstrated collections in the right iliopsoas muscle, the
largest of which measures up to 3.3 cm, unchanged from the prior
exam when remeasured similarly. These may represent evolving
hematomas, given change in signal characteristics.
2. No spinal canal stenosis or significant neural foraminal
narrowing.

## 2021-06-09 MED ORDER — SODIUM CHLORIDE 0.9% IV SOLUTION: Freq: Once | INTRAVENOUS | Status: AC

## 2021-06-09 MED ORDER — CEFAZOLIN SODIUM-DEXTROSE 1-4 GM/50ML-% IV SOLN
1.0000 g | INTRAVENOUS | Status: DC
  Administered 2021-06-09: 1 g via INTRAVENOUS
  Filled 2021-06-09 (×3): qty 50

## 2021-06-09 MED ORDER — PROSOURCE PLUS PO LIQD
30.0000 mL | Freq: Two times a day (BID) | ORAL | Status: DC
  Administered 2021-06-09 – 2021-06-23 (×27): 30 mL via ORAL
  Filled 2021-06-09 (×26): qty 30

## 2021-06-09 MED ORDER — IBUPROFEN 200 MG PO TABS: 200.0000 mg | ORAL_TABLET | Freq: Once | ORAL | Status: DC

## 2021-06-09 MED ORDER — ACETAMINOPHEN 325 MG PO TABS
650.0000 mg | ORAL_TABLET | Freq: Once | ORAL | Status: AC
  Administered 2021-06-09: 650 mg via ORAL
  Filled 2021-06-09 (×2): qty 2

## 2021-06-09 MED ORDER — NEPRO/CARBSTEADY PO LIQD
237.0000 mL | Freq: Every day | ORAL | Status: DC
  Administered 2021-06-12 – 2021-06-22 (×2): 237 mL via ORAL

## 2021-06-09 MED ORDER — DIPHENHYDRAMINE HCL 25 MG PO CAPS
25.0000 mg | ORAL_CAPSULE | Freq: Once | ORAL | Status: AC
  Administered 2021-06-09: 25 mg via ORAL
  Filled 2021-06-09 (×2): qty 1

## 2021-06-09 NOTE — Progress Notes (Signed)
Nutrition Follow-up  DOCUMENTATION CODES:   Severe malnutrition in context of acute illness/injury  INTERVENTION:   -Double protein portions with meals -Nepro Shake po daily, each supplement provides 425 kcal and 19 grams protein  -30 ml Prosource Plus BID, each supplement provides 100 kcals and 15 grams protein -Renal MVI daily  NUTRITION DIAGNOSIS:   Severe Malnutrition related to acute illness (cardiac arrest, AKI, ileus) as evidenced by energy intake < or equal to 50% for > or equal to 5 days, percent weight loss (7.4% weight loss in less than 2 weeks).  Ongoing  GOAL:   Patient will meet greater than or equal to 90% of their needs  Progressing   MONITOR:   PO intake, Supplement acceptance, Diet advancement, Labs, Weight trends  REASON FOR ASSESSMENT:   Ventilator, Consult Enteral/tube feeding initiation and management  ASSESSMENT:   46 yo male admitted post OOH cardiac arrest, acute respiratory failure post arrest, possible aspiration-intubated, AKI requiring CRRT. No PMH on file. Pt is spanish speaking  01/15 - admitted, intubated, CRRT initiated 01/16 - TTM initiated 01/19 - extubated, CRRT held 01/20 - diet advanced to Regular 01/21 - NPO due to concern for bowel ischemia 01/22 - full liquid diet, first HD treatment 01/23 - NPO 01/24 - s/p diagnostic laparoscopy revealing viable but thickened bowel without obstruction, second HD treatment 01/25 - clear liquid diet 1/26- advanced to soft diet, third HD treatment   Reviewed I/O's: -2.2 L x 24 hours and -9.2 L since 05/26/21  UOP: 400 ml x 24 hours   Per general surgery notes, bowel was found to be thickened, but viable.   Per nephrology notes, last HD on 06/08/21. Pt with inconsistent urine output. Pt is hesitant to undergo AVF creation, but amenable to [facility] placement.   Pt unavailable at time of visit. Attempted to speak with pt via call to hospital room phone, however, unable to reach.   Intake has  improved since last visit. Noted meal completions 25-100% (averaging 50-80%). Pt is refusing Ensure Enlive supplements.   Medications reviewed and include phoslo, aranesp, and folic acid.   Labs reviewed: Na: 128, Phos: 7.7.    Diet Order:   Diet Order             DIET SOFT Room service appropriate? Yes; Fluid consistency: Thin  Diet effective now                   EDUCATION NEEDS:   Not appropriate for education at this time  Skin:  Skin Assessment: Skin Integrity Issues: Skin Integrity Issues:: Incisions, Other (Comment) Incisions: abdomen Other: rt knee puncture, sternal burn due to defibrilation pads  Last BM:  06/08/21  Height:   Ht Readings from Last 1 Encounters:  06/02/21 6' (1.829 m)    Weight:   Wt Readings from Last 1 Encounters:  06/09/21 87.4 kg   BMI:  Body mass index is 26.13 kg/m.  Estimated Nutritional Needs:   Kcal:  2200-2400  Protein:  120-135 grams  Fluid:  1000 ml + UOP    Loistine Chance, RD, LDN, [place] Registered Dietitian II Certified Diabetes Care and Education Specialist Please refer to [facility] for RD and/or RD on-call/weekend/after hours pager

## 2021-06-09 NOTE — Progress Notes (Incomplete)
NEW ADMISSION NOTE    Arrival Method: ED stretcher Mental Orientation: AAOx4 Telemetry: 5M01 Assessment: Completed Skin: See flowsheet IV: LFA and RIJ Pain: 0/10 Tubes: n/a Safety Measures: Safety Fall Prevention Plan has been given, discussed and signed Admission: Completed [facility] Orientation: Patient has been orientated to the room, unit and staff.  Family: none at bedside   Orders have been reviewed and implemented. Will continue to monitor the patient. Call light has been placed within reach and bed alarm has been activated.

## 2021-06-09 NOTE — TOC Initial Note (Signed)
Transition of Care [facility]) - Initial/Assessment Note    Patient Details  Name: Andres Braun MRN: [id number] Date of Birth: 1975-06-30  [facility] CM/SW Contact:    Tom-Johnson, Hershal Coria, RN Phone Number: 06/09/2021, 3:48 PM  Clinical Narrative:                  CM spoke with patient at bedside about needs for post hospital transition. Graciela, interpreted. Patient states he lives alone in a room at his boss's home. Employed with a [employer] but could not remember the name. Has two young children. Patient is undocumented and therefore does not have Aeronautical engineer and PCP. Followup appointment scheduled at [facility] and information on AVS. MATCH done for medication assistance. CIR recommended by PT/OT and following for 24 support after rehab. Lovena Le, calling family and friends for support. No CIR bed available today. CM will continue to follow with needs.  Expected Discharge Plan: IP Rehab Facility Barriers to Discharge: Continued Medical Work up   Patient Goals and CMS Choice Patient states their goals for this hospitalization and ongoing recovery are:: To return home CMS Medicare.gov Compare Post Acute Care list provided to:: Patient Choice offered to / list presented to : Patient  Expected Discharge Plan and Services Expected Discharge Plan: IP Rehab Facility In-house Referral: Interpreting Services, PCP / [facility] Discharge Planning Services: CM Consult, Follow-up appt scheduled, MATCH Program Post Acute Care Choice: IP Rehab Living arrangements for the past 2 months: Single Family Home                                      Prior Living Arrangements/Services Living arrangements for the past 2 months: Single Family Home Lives with:: Self Patient language and need for interpreter reviewed:: Yes Do you feel safe going back to the place where you live?: Yes      Need for Family Participation in Patient Care: Yes (Comment) Care  giver support system in place?: Yes (comment)   Criminal Activity/Legal Involvement Pertinent to Current Situation/Hospitalization: No - Comment as needed  Activities of Daily Living Home Assistive Devices/Equipment: None ADL Screening (condition at time of admission) Patient's cognitive ability adequate to safely complete daily activities?: Yes Is the patient deaf or have difficulty hearing?: No Does the patient have difficulty seeing, even when wearing glasses/contacts?: No Does the patient have difficulty concentrating, remembering, or making decisions?: No Patient able to express need for assistance with ADLs?: Yes Does the patient have difficulty dressing or bathing?: No Independently performs ADLs?: Yes (appropriate for developmental age) Does the patient have difficulty walking or climbing stairs?: No Weakness of Legs: None Weakness of Arms/Hands: None  Permission Sought/Granted Permission sought to share information with : Case Manager, Magazine features editor, Family Supports Permission granted to share information with : Yes, Verbal Permission Granted              Emotional Assessment Appearance:: Appears stated age Attitude/Demeanor/Rapport: Engaged, Gracious Affect (typically observed): Accepting, Appropriate, Calm, Hopeful Orientation: : Oriented to Self, Oriented to Place, Oriented to  Time, Oriented to Situation Alcohol / Substance Use: Not Applicable Psych Involvement: No (comment)  Admission diagnosis:  Cardiac arrest (HCC) [I46.9] Shock (HCC) [R57.9] Hypothermia, initial encounter [T68.XXXA] Acute respiratory failure with hypoxia and hypercapnia (HCC) [J96.01, J96.02] Patient Active Problem List   Diagnosis Date Noted   Protein-calorie malnutrition, severe 06/03/2021   Aspiration pneumonia of  both lower lobes due to vomit (HCC) 05/27/2021   Acute respiratory failure with hypoxia and hypercapnia (HCC)    Septic shock due to undetermined organism  (HCC)    AKI (acute kidney injury) (HCC)    Acute metabolic encephalopathy    Cardiac arrest with pulseless electrical activity (HCC) 05/24/2021   PCP:  Pcp, No Pharmacy:  No Pharmacies Listed    Social Determinants of Health (SDOH) Interventions    Readmission Risk Interventions No flowsheet data found.

## 2021-06-09 NOTE — Progress Notes (Signed)
PROGRESS NOTE    Andres Braun  HTD:[id number] DOB: 06-03-1975 DOA: 05/24/2021 PCP: Merryl Hacker, No   Chief Complaint  Patient presents with   Cardiac Arrest    Brief Narrative:   46 year old male admitted to ICU 1/15 under PCCM care after cardiac arrest at home.  PEA arrest, s/p CPR x20 minutes and epi x5.  Patient reportedly out drinking with his friends the night prior to admission.  Found by friends with agonal breathing.  Intubated on arrival, needed vasopressors, noted to have renal failure, rhabdomyolysis with CK in the 14 K range, treated for aspiration pneumonia, nephrology consulted and started on CRRT.  Extubated 1/19 and off pressors.  Mental status slowly improved.  Transferred to telemetry.  Care transferred to [facility] on 1/24.  Nephrology following for HD needs.  General surgery on board for questionable small bowel ischemia and taken emergently to surgery 1/24. The bowel in question was found to be thickened, but viable. Hospital course complicated by unexplained left lower extremity weakness. Extensive work up of the neuraxis is negative for compressive pathology. Differential is probably a psoas hematoma causing lumbar plexus compression/ irritation leading to secondary weakness. IR suggested that it is not amenable for aspiration or drain placement. Neurology consulted, suggested not a neurological injury as the neuraxis is negative.    Assessment & Plan:   Principal Problem:   Cardiac arrest with pulseless electrical activity ([place]) Active Problems:   Acute respiratory failure with hypoxia and hypercapnia (HCC)   Septic shock due to undetermined organism ([place])   AKI (acute kidney injury) ([place])   Acute metabolic encephalopathy   Aspiration pneumonia of both lower lobes due to vomit (HCC)   Protein-calorie malnutrition, severe   Acute hypoxic respiratory failure/aspiration pneumonia Patient intubated on 05/24/2021 and extubated on 05/28/2021 . Patient completed the course of  antibiotics for aspiration pneumonia. Patient on RA.     Ischemic enteritis versus colitis with ileus General surgery consulted, there was concerned about an ischemic bowel.  Patient was taken to the OR for diagnostic laparoscopy, surgery revealed bowel in question is thickened,  no ischemia was seen.  hence no further intervention at this time.  Advance diet as tolerated without any issues at this time. Diarrhea improved with imodium.  C DIFF PCR and Gi panel is negative.  Continue to monitor.     Lower extremity weakness with left foot drop:  -MRI of the neuraxis  was unremarkable for acute process .  As per the MRI of the lumbar spine, multiple fluid collections in the right ileo psoas muscle.  Dr Radene Gunning spoke with neurology and neuro surgery on 1/24, no surgical cause for his lower extremity weakness. He suggested that a psoas abscess or hematoma could cause lumbar plexus compression /irritation leading to secondary weakness.   IR consulted for aspiration, suggested any aspiration would predispose to infection/ not amenable for aspiration or drain placement. Did not advise aspiration.  MRI of the femur shows Diffuse muscular abnormalities throughout the visualized portions of both thighs with heterogeneous T2 signal and enhancement, especially within the left gluteus maximus and quadratus femoris muscles. Findings are compatible with the diagnosis of rhabdomyolysis, with the differential including myositis related to connective tissue disease and polymyositis. No drainable abscess identified. pt unable to move the left lower extremity from severe pain and weakness.  Orthopedics consulted, suggested its a neurological/ lumbar plexus ischemic injury.  Hoping for recovery with therapy evaluations.  CK levels are improving. Vitamin b12 and folate levels wnl. ESR  is 85, CRP is 5.4   Pt has good pulse in DP, no sensory deficits. No signs of compartment syndrome.  Pt reports his pain and burning  sensation has improved with gabapentin.  Therapy evaluations recommending CIR.     Acute metabolic encephalopathy: Resolved. He is alert and answering questions appropriately.    Right anterior knee wound:  -wound care consulted.    Acute kidney injury with metabolic acidosis/rhabdomyolysis Patient has been off CRRT as of 05/28/2021. Nephrology on board and appreciate recommendations.  Hoping for renal recovery, if no recovery will need a perm cath next week.   Improving CK levels.   Hyponatremia -Secondary to AKI   Severe protein calorie malnutrition. Supplementation added. Albumin around .1.8  Mechanical fall 1/24 and hit his head -As per nursing report, prior to going to the OR, patient attempted to get out of the bed, fell and hit his head without any obvious external injuries or LOC.  Patient's friend was in the room. Advised nursing regarding strict fall precautions, placement of safety sitter if needed and monitor closely.   Anemia of acute illness/  acute anemia of blood loss from hematoma in the psoas muscle  Hemoglobin on admission is around 15.9 to  12.6 to 11.8 to 10.5 to 9.9 to 8.7 to 8.2 to 7.5 to 7.3 to 6.8 today.  Transfuse  to keep hemoglobin greater than 7. 1 unit of prbc transfusion ordered, recheck H&H tonight.  Iron deficiency on anemia panel. Will need supplementation on discharge.  Stool for occult blood is positive on 05/30/21. Suspect from colitis.      Elevated liver enzymes:  Probably from ETOH abuse, shock liver and rhabdomyolysis.  Much improved.    Fever today, with improving Leukocytosis:  Reactive or from muscle necrosis. Blood cultures are negative so far.  Procalcitonin is 0.8. on cefazolin for muscle necrosis. C diff pcr is negative and GI panel is negative  Unable to get UA as pt is incontinent.  ID on board and following. Ordered repeat MRI of the lumbar spine.    Thrombocytosis:  Reactive or from infection.   DVT  prophylaxis: Heparin.  Code Status: (Full Code) Family Communication: none at bedside.  Disposition:   Status is: Inpatient  Remains inpatient appropriate because: unsafe d/c plan.        Consultants:  Nephrology Surgery.  Orthopedics Dr Dr Sammuel Hines. ID  PCCM Neurology.   Procedures:   Exploratory laparotomy Mechanical ventilation CRRT Hemodialysis   Antimicrobials: Antibiotics Given (last 72 hours)     Date/Time Action Medication Dose Rate   06/06/21 2137 New Bag/Given   ceFAZolin (ANCEF) IVPB 1 g/50 mL premix 1 g 100 mL/hr   06/07/21 2109 New Bag/Given   ceFAZolin (ANCEF) IVPB 1 g/50 mL premix 1 g 100 mL/hr   06/09/21 0043 New Bag/Given   ceFAZolin (ANCEF) IVPB 1 g/50 mL premix 1 g 100 mL/hr         Subjective:  No change in the weakness in the left lower extremity.   Objective: Vitals:   06/09/21 0833 06/09/21 0940 06/09/21 1011 06/09/21 1258  BP: (!) 143/87 119/73 123/78 (!) 143/79  Pulse: (!) 119 (!) 118 (!) 113 (!) 118  Resp: _0 Temp: 98.7 F (37.1 C) 100.2 F (37.9 C) 98.9 F (37.2 C) 99.6 F (37.6 C)  TempSrc: Oral Oral Oral Oral  SpO2: 98% 98% 98% 98%  Weight:      Height:  Intake/Output Summary (Last 24 hours) at 06/09/2021 1347 Last data filed at 06/09/2021 1258 Gross per 24 hour  Intake 834 ml  Output 2400 ml  Net -1566 ml    Filed Weights   06/08/21 0400 06/08/21 1339 06/09/21 0500  Weight: 86.9 kg 87.4 kg 87.4 kg    Examination:  General exam: spanish speaking gentleman, not in distress.  Respiratory system: Air entry fair, on RA.  Cardiovascular system: S1 & S2 heard, tachycardic, no JVD, leg edema present.  Gastrointestinal system: Abdomen is soft, mildly tender, no distention. Bowel sounds wnl.  Central nervous system: alert and oriented, strength is 0/5 in  left lower extremity burning sensation in the left leg improving.  Extremities: right knee wound is wrapped.  Skin: No rashes,  Psychiatry: mood  is appropriate.         Data Reviewed: I have personally reviewed following labs and imaging studies  CBC: Recent Labs  Lab 06/04/21 0248 06/06/21 0333 06/07/21 0222 06/08/21 1022 06/09/21 0359  WBC 21.9* 24.6* 24.1* 21.8* 19.5*  NEUTROABS 17.2*  --   --  18.5* 15.9*  HGB 8.2* 7.4* 7.5* 7.3* 6.8*  HCT 23.4* 21.7* 22.1* 22.0* 20.4*  MCV 81.3 82.5 84.4 85.3 84.6  PLT 542* 678* 762* 784* 700*     Basic Metabolic Panel: Recent Labs  Lab 06/05/21 0442 06/06/21 0333 06/07/21 0222 06/08/21 1022 06/09/21 0359  NA 132* 130* 129* 131* 128*  K 4.0 4.4 4.4 4.2 4.9  CL 95* 93* 93* 100 93*  CO2 _0 20* 20*  GLUCOSE 105* 108* 107* 109* 123*  BUN 75* 95* 53* 65* 46*  CREATININE 6.67* 9.03* 6.60* 8.30* 7.08*  CALCIUM 7.4* 7.4* 7.6* 6.8* 7.6*  PHOS 9.3* 11.0* 7.9* 9.2* 7.7*     GFR: Estimated Creatinine Clearance: 14.5 mL/min (A) (by C-G formula based on SCr of 7.08 mg/dL (H)).  Liver Function Tests: Recent Labs  Lab 06/02/21 1610 06/03/21 0408 06/04/21 0248 06/05/21 0442 06/06/21 0333 06/07/21 0222 06/08/21 1022 06/09/21 0359  AST 77*  --  42*  --   --   --  25  --   ALT 173*  --  110*  --   --   --  16  --   ALKPHOS 158*  --  136*  --   --   --  148*  --   BILITOT 0.6  --  0.4  --   --   --  0.2*  --   PROT 5.0*  --  4.8*  --   --   --  5.3*  --   ALBUMIN 1.9*   < > 1.8* 1.8* 1.8* 1.8* 1.8*   1.6* 1.8*   < > = values in this interval not displayed.     CBG: No results for input(s): GLUCAP in the last 168 hours.    Recent Results (from the past 240 hour(s))  Gastrointestinal Panel by PCR , Stool     Status: None   Collection Time: 06/01/21  5:27 AM   Specimen: Stool  Result Value Ref Range Status   Campylobacter species NOT DETECTED NOT DETECTED Final   Plesimonas shigelloides NOT DETECTED NOT DETECTED Final   Salmonella species NOT DETECTED NOT DETECTED Final   Yersinia enterocolitica NOT DETECTED NOT DETECTED Final   Vibrio species NOT  DETECTED NOT DETECTED Final   Vibrio cholerae NOT DETECTED NOT DETECTED Final   Enteroaggregative E coli (EAEC) NOT DETECTED NOT DETECTED Final   Enteropathogenic E  coli (EPEC) NOT DETECTED NOT DETECTED Final   Enterotoxigenic E coli (ETEC) NOT DETECTED NOT DETECTED Final   Shiga like toxin producing E coli (STEC) NOT DETECTED NOT DETECTED Final   Shigella/Enteroinvasive E coli (EIEC) NOT DETECTED NOT DETECTED Final   Cryptosporidium NOT DETECTED NOT DETECTED Final   Cyclospora cayetanensis NOT DETECTED NOT DETECTED Final   Entamoeba histolytica NOT DETECTED NOT DETECTED Final   Giardia lamblia NOT DETECTED NOT DETECTED Final   Adenovirus F40/41 NOT DETECTED NOT DETECTED Final   Astrovirus NOT DETECTED NOT DETECTED Final   Norovirus GI/GII NOT DETECTED NOT DETECTED Final   Rotavirus A NOT DETECTED NOT DETECTED Final   Sapovirus (I, II, IV, and V) NOT DETECTED NOT DETECTED Final    Comment: Performed at [facility], [address] [place], [place] [address]  Culture, blood (routine x 2)     Status: None (Preliminary result)   Collection Time: 06/07/21 10:14 AM   Specimen: BLOOD  Result Value Ref Range Status   Specimen Description BLOOD RIGHT ANTECUBITAL  Final   Special Requests   Final    BOTTLES DRAWN AEROBIC ONLY Blood Culture adequate volume   Culture   Final    NO GROWTH 2 DAYS Performed at [facility], [place] [address], [place], [place] [address]    Report Status PENDING  Incomplete  Culture, blood (routine x 2)     Status: None (Preliminary result)   Collection Time: 06/07/21 10:17 AM   Specimen: BLOOD RIGHT HAND  Result Value Ref Range Status   Specimen Description BLOOD RIGHT HAND  Final   Special Requests   Final    BOTTLES DRAWN AEROBIC ONLY Blood Culture adequate volume   Culture   Final    NO GROWTH 2 DAYS Performed at [facility], [place] [address] [place], [place] [address]    Report Status PENDING  Incomplete  Gastrointestinal Panel by  PCR , Stool     Status: None   Collection Time: 06/07/21  8:38 PM   Specimen: STOOL  Result Value Ref Range Status   Campylobacter species NOT DETECTED NOT DETECTED Final   Plesimonas shigelloides NOT DETECTED NOT DETECTED Final   Salmonella species NOT DETECTED NOT DETECTED Final   Yersinia enterocolitica NOT DETECTED NOT DETECTED Final   Vibrio species NOT DETECTED NOT DETECTED Final   Vibrio cholerae NOT DETECTED NOT DETECTED Final   Enteroaggregative E coli (EAEC) NOT DETECTED NOT DETECTED Final   Enteropathogenic E coli (EPEC) NOT DETECTED NOT DETECTED Final   Enterotoxigenic E coli (ETEC) NOT DETECTED NOT DETECTED Final   Shiga like toxin producing E coli (STEC) NOT DETECTED NOT DETECTED Final   Shigella/Enteroinvasive E coli (EIEC) NOT DETECTED NOT DETECTED Final   Cryptosporidium NOT DETECTED NOT DETECTED Final   Cyclospora cayetanensis NOT DETECTED NOT DETECTED Final   Entamoeba histolytica NOT DETECTED NOT DETECTED Final   Giardia lamblia NOT DETECTED NOT DETECTED Final   Adenovirus F40/41 NOT DETECTED NOT DETECTED Final   Astrovirus NOT DETECTED NOT DETECTED Final   Norovirus GI/GII NOT DETECTED NOT DETECTED Final   Rotavirus A NOT DETECTED NOT DETECTED Final   Sapovirus (I, II, IV, and V) NOT DETECTED NOT DETECTED Final    Comment: Performed at [facility], [address] [place], [place] [address]  C Difficile Quick Screen (NO PCR Reflex)     Status: None   Collection Time: 06/07/21  8:38 PM   Specimen: STOOL  Result Value  Ref Range Status   C Diff antigen NEGATIVE NEGATIVE Final   C Diff toxin NEGATIVE NEGATIVE Final   C Diff interpretation No C. difficile detected.  Final    Comment: Performed at [facility], [place] [address] [place], [place] [address]          Radiology Studies: No results found.      Scheduled Meds:  calcium acetate  1,334 mg Oral TID WC   Chlorhexidine Gluconate Cloth  6 each Topical Q0600   Chlorhexidine Gluconate  Cloth  6 each Topical Q0600   Chlorhexidine Gluconate Cloth  6 each Topical Q0600   darbepoetin (ARANESP) injection - NON-DIALYSIS  100 mcg Subcutaneous Q Mon-1800   feeding supplement  237 mL Oral TID BM   folic acid  1 mg Per Tube Daily   gabapentin  100 mg Oral BID   Gerhardt's butt cream   Topical TID   heparin injection (subcutaneous)  5,000 Units Subcutaneous Q8H   multivitamin  1 tablet Oral QHS   pantoprazole  40 mg Oral BID   Continuous Infusions:  sodium chloride Stopped (05/29/21 1602)    ceFAZolin (ANCEF) IV       LOS: 16 days       Hosie Poisson, MD [facility]   To contact the attending provider between 7A-7P or the covering provider during after hours 7P-7A, please log into the web site [email or web address] and access using universal [place] password for that web site. If you do not have the password, please call the hospital operator.  06/09/2021, 1:47 PM

## 2021-06-09 NOTE — Progress Notes (Signed)
Consent obtained for blood transfusion.  

## 2021-06-09 NOTE — Progress Notes (Signed)
Inpatient Rehab Admissions Coordinator:   I do not have a CIR bed for this Pt. Today. I spoke with pt.'s friend Laverle Hobby regarding Pt.'s disposition after CIR. He states that while he is not able to provide 24/7 support at discharge, he is reaching out to Pt.'s other friends and family as well as some church members to see what they are able to provide. He states that he plans to call pt.'s brother today to see if he is able to assist but states that he does not currently have the brother's number to share. I will continue to follow and work towards identifying a safe disposition plan for this Pt.   Megan Salon, MS, CCC-SLP Rehab Admissions Coordinator  [phone] (celll) [phone] (office)

## 2021-06-09 NOTE — Progress Notes (Signed)
°   06/09/21 0833  Assess: MEWS Score  Temp 98.7 F (37.1 C)  BP (!) 143/87  Pulse Rate (!) 119  Resp 18  SpO2 98 %  Assess: MEWS Score  MEWS Temp 0  MEWS Systolic 0  MEWS Pulse 2  MEWS RR 0  MEWS LOC 0  MEWS Score 2  MEWS Score Color Yellow  Assess: if the MEWS score is Yellow or Red  Were vital signs taken at a resting state? Yes  Focused Assessment No change from prior assessment  Early Detection of Sepsis Score *See Row Information* High  MEWS guidelines implemented *See Row Information* No, previously yellow, continue vital signs every 4 hours  Take Vital Signs  Increase Vital Sign Frequency  Yellow: Q 2hr X 2 then Q 4hr X 2, if remains yellow, continue Q 4hrs   Heart rate remains in 110-125

## 2021-06-09 NOTE — Progress Notes (Signed)
Requested to see pt for out-pt HD arrangements. Pt is currently AKI and uninsured. Navigator is unable to CLIP pt to out-pt clinic with AKI diagnosis with pt being uninsured. Nephrologist made aware of this info. Pt would need to be deemed ESRD or obtain insurance coverage in order for pt to be clipped. Will assist as needed.   Olivia Canter Renal Navigator [phone]

## 2021-06-09 NOTE — Progress Notes (Signed)
[facility] Progress Note   Subjective:   Patient resting in bed with no complaints. Discussed that despite his reported increased UOP kidneys have not showed significant signs of recovery.  It is still possible that his kidneys will recover and he is very upset to hear that he may have to leave the hospital on dialysis.  He is agreeable to tunneled dialysis catheter placement  Objective Vitals:   06/09/21 0531 06/09/21 0622 06/09/21 0833 06/09/21 0940  BP: 132/85 139/80 (!) 143/87 119/73  Pulse: (!) 118 (!) 124 (!) 119 (!) 118  Resp: 17 20 18 18   Temp: 98.8 F (37.1 C) 99.4 F (37.4 C) 98.7 F (37.1 C) 100.2 F (37.9 C)  TempSrc: Oral Oral Oral Oral  SpO2: 98% 99% 98% 98%  Weight:      Height:       Physical Exam General: Sitting in bed, no distress Heart: Tachycardic, 1+ pitting edema in the bilateral lower extremities Lungs: Bilateral chest rise with no increased work of breathing Abdomen: soft, mildly distended Extremities: Warm and well perfused Neuro:  awake and alert, oriented to person and place Dialysis Access:  R IJ  temp HD catheter -  placed 1/23  Additional Objective Labs: Basic Metabolic Panel: Recent Labs  Lab 06/07/21 0222 06/08/21 1022 06/09/21 0359  NA 129* 131* 128*  K 4.4 4.2 4.9  CL 93* 100 93*  CO2 24 20* 20*  GLUCOSE 107* 109* 123*  BUN 53* 65* 46*  CREATININE 6.60* 8.30* 7.08*  CALCIUM 7.6* 6.8* 7.6*  PHOS 7.9* 9.2* 7.7*   Liver Function Tests: Recent Labs  Lab 06/02/21 1610 06/03/21 0408 06/04/21 0248 06/05/21 0442 06/07/21 0222 06/08/21 1022 06/09/21 0359  AST 77*  --  42*  --   --  25  --   ALT 173*  --  110*  --   --  16  --   ALKPHOS 158*  --  136*  --   --  148*  --   BILITOT 0.6  --  0.4  --   --  0.2*  --   PROT 5.0*  --  4.8*  --   --  5.3*  --   ALBUMIN 1.9*   < > 1.8*   < > 1.8* 1.8*   1.6* 1.8*   < > = values in this interval not displayed.   No results for input(s): LIPASE, AMYLASE in the last 168  hours. CBC: Recent Labs  Lab 06/04/21 0248 06/06/21 0333 06/07/21 0222 06/08/21 1022 06/09/21 0359  WBC 21.9* 24.6* 24.1* 21.8* 19.5*  NEUTROABS 17.2*  --   --  18.5* 15.9*  HGB 8.2* 7.4* 7.5* 7.3* 6.8*  HCT 23.4* 21.7* 22.1* 22.0* 20.4*  MCV 81.3 82.5 84.4 85.3 84.6  PLT 542* 678* 762* 784* 700*   Blood Culture    Component Value Date/Time   SDES BLOOD RIGHT HAND 06/07/2021 1017   SPECREQUEST  06/07/2021 1017    BOTTLES DRAWN AEROBIC ONLY Blood Culture adequate volume   CULT  06/07/2021 1017    NO GROWTH 1 DAY Performed at [facility], [place] [address] [place], [place] [address]    REPTSTATUS PENDING 06/07/2021 1017    Cardiac Enzymes: Recent Labs  Lab 06/02/21 1610 06/03/21 0408 06/04/21 0248 06/05/21 0442 06/06/21 0333  CKTOTAL 2,119* 1,166* 783* 462* 432*   CBG: No results for input(s): GLUCAP in the last 168 hours.  Iron Studies:  No results for input(s): IRON, TIBC, TRANSFERRIN,  FERRITIN in the last 72 hours.   @lablastinr3 @ Studies/Results: No results found. Medications:  sodium chloride Stopped (05/29/21 1602)    ceFAZolin (ANCEF) IV      calcium acetate  1,334 mg Oral TID WC   Chlorhexidine Gluconate Cloth  6 each Topical Q0600   Chlorhexidine Gluconate Cloth  6 each Topical Q0600   Chlorhexidine Gluconate Cloth  6 each Topical Q0600   darbepoetin (ARANESP) injection - NON-DIALYSIS  100 mcg Subcutaneous Q Mon-1800   feeding supplement  237 mL Oral TID BM   folic acid  1 mg Per Tube Daily   gabapentin  100 mg Oral BID   Gerhardt's butt cream   Topical TID   heparin injection (subcutaneous)  5,000 Units Subcutaneous Q8H   ibuprofen  200 mg Oral Once   multivitamin  1 tablet Oral QHS   pantoprazole  40 mg Oral BID    Assessment/ Plan: AKI severe: Baseline creatinine possibly around 2.7.  In setting of cardiac arrest, rhabdo.  Oliguric to anuric.  Required CRRT 1/15 - 1/19.   And intermittent hemodialysis now on MWF schedule.  Patient is  pretty upset to hear that he may have to leave the hospital on dialysis.  His baseline creatinine is not known but did likely have significant CKD.  He has made some urine but creatinine continues to rise in between dialysis.  Patient is hesitant to undergo AVF creation at this time but is willing to have [facility] placement.  We will consult IR for [facility] placement and undergo CLIP for AKI as of now. SP cardiac arrest - asystolic arrest in setting of prob asp PNA/ ARDS in setting of EtOH Severe metabolic/ resp acidosis -   now resolved LE neurologic deficits - ortho and neuro as well as NSG seeing-  thought is fluid in psoas muscle could be causing some compression-    Possible ischemic bowel - surgery consulting-  ex lap this past week did not show ischemic bowel needing resection but with some edema in bowel Anemia- hgb 6.8 today receiving transfusion. Continue ESA. Bones-  phos very high- now on phoslo.Is improving.   Reesa Chew  06/09/2021, 10:04 AM  [facility]

## 2021-06-10 ENCOUNTER — Inpatient Hospital Stay (HOSPITAL_COMMUNITY): Payer: Self-pay

## 2021-06-10 DIAGNOSIS — R509 Fever, unspecified: Secondary | ICD-10-CM

## 2021-06-10 DIAGNOSIS — K6812 Psoas muscle abscess: Secondary | ICD-10-CM

## 2021-06-10 LAB — CBC WITH DIFFERENTIAL/PLATELET
Abs Immature Granulocytes: 0.22 10*3/uL — ABNORMAL HIGH (ref 0.00–0.07)
Basophils Absolute: 0.1 10*3/uL (ref 0.0–0.1)
Basophils Relative: 1 %
Eosinophils Absolute: 0.2 10*3/uL (ref 0.0–0.5)
Eosinophils Relative: 1 %
HCT: 22.6 % — ABNORMAL LOW (ref 39.0–52.0)
Hemoglobin: 7.4 g/dL — ABNORMAL LOW (ref 13.0–17.0)
Immature Granulocytes: 1 %
Lymphocytes Relative: 7 %
Lymphs Abs: 1.2 10*3/uL (ref 0.7–4.0)
MCH: 27.9 pg (ref 26.0–34.0)
MCHC: 32.7 g/dL (ref 30.0–36.0)
MCV: 85.3 fL (ref 80.0–100.0)
Monocytes Absolute: 1.6 10*3/uL — ABNORMAL HIGH (ref 0.1–1.0)
Monocytes Relative: 9 %
Neutro Abs: 14 10*3/uL — ABNORMAL HIGH (ref 1.7–7.7)
Neutrophils Relative %: 81 %
Platelets: 667 10*3/uL — ABNORMAL HIGH (ref 150–400)
RBC: 2.65 MIL/uL — ABNORMAL LOW (ref 4.22–5.81)
RDW: 14.1 % (ref 11.5–15.5)
WBC: 17.3 10*3/uL — ABNORMAL HIGH (ref 4.0–10.5)
nRBC: 0 % (ref 0.0–0.2)

## 2021-06-10 LAB — TYPE AND SCREEN
ABO/RH(D): O POS
Antibody Screen: NEGATIVE
Unit division: 0

## 2021-06-10 LAB — RENAL FUNCTION PANEL
Albumin: 1.6 g/dL — ABNORMAL LOW (ref 3.5–5.0)
Anion gap: 14 (ref 5–15)
BUN: 61 mg/dL — ABNORMAL HIGH (ref 6–20)
CO2: 22 mmol/L (ref 22–32)
Calcium: 7.9 mg/dL — ABNORMAL LOW (ref 8.9–10.3)
Chloride: 93 mmol/L — ABNORMAL LOW (ref 98–111)
Creatinine, Ser: 8.82 mg/dL — ABNORMAL HIGH (ref 0.61–1.24)
GFR, Estimated: 7 mL/min — ABNORMAL LOW (ref >60)
Glucose, Bld: 106 mg/dL — ABNORMAL HIGH (ref 70–99)
Phosphorus: 8.3 mg/dL — ABNORMAL HIGH (ref 2.5–4.6)
Potassium: 4.9 mmol/L (ref 3.5–5.1)
Sodium: 129 mmol/L — ABNORMAL LOW (ref 135–145)

## 2021-06-10 LAB — BPAM RBC
Blood Product Expiration Date: 202302162359
ISSUE DATE / TIME: 202301310953
Unit Type and Rh: 5100

## 2021-06-10 IMAGING — DX DG CHEST 1V PORT
1 series · 1 of 1 positions shown · non-contrast
Comparison: [DATE].

CLINICAL DATA: Fever, cardiac arrest at home.

EXAM:
PORTABLE CHEST 1 VIEW

[chest ap]
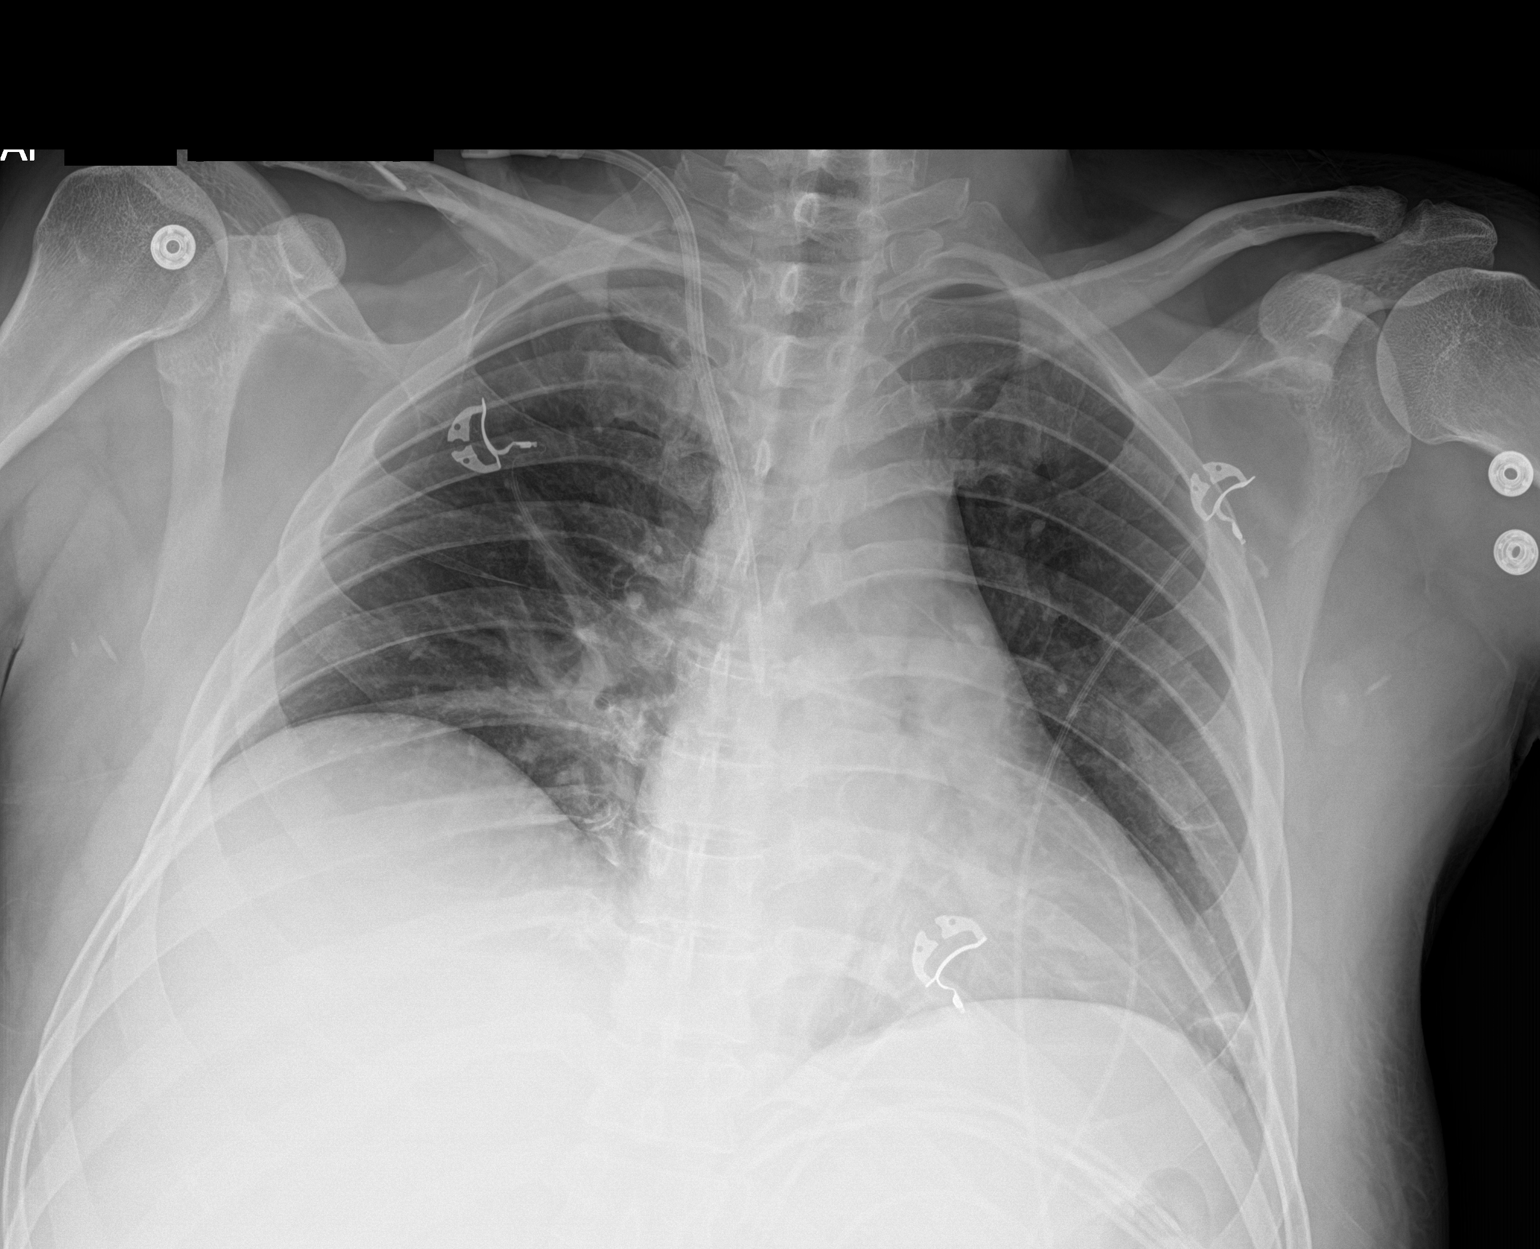

[1 of 1 positions shown; findings below may reference images not displayed]

FINDINGS: The heart size and mediastinal contours are within normal limits.
Lung volumes are low. Strandy opacities are noted in the perihilar
region on the right and left lung base, similar in appearance to the
prior exam. No effusion or pneumothorax. A right internal jugular
central venous catheter terminates over the superior vena cava. No
acute osseous abnormality.
IMPRESSION: Strandy opacities in the lungs bilaterally, possible subsegmental
atelectasis in not significantly changed from the prior exam.

## 2021-06-10 MED ORDER — HEPARIN SODIUM (PORCINE) 1000 UNIT/ML IJ SOLN
INTRAMUSCULAR | Status: AC
  Administered 2021-06-10: 1000 [IU]
  Filled 2021-06-10: qty 3

## 2021-06-10 MED ORDER — VANCOMYCIN HCL IN DEXTROSE 1-5 GM/200ML-% IV SOLN
1000.0000 mg | Freq: Once | INTRAVENOUS | Status: AC
  Administered 2021-06-10: 1000 mg via INTRAVENOUS
  Filled 2021-06-10: qty 200

## 2021-06-10 NOTE — Progress Notes (Addendum)
[facility]   Reason for visit: Follow up on psoas abscess  Interval History: fever again overnight; WBC 17.3.  still no movement of his left leg.  MRI of lumbar spine with little change.   Day 17 total antibiotics  Physical Exam: Constitutional: seen on hemodialysis Vitals:   06/10/21 1030 06/10/21 1100  BP: (!) 158/88 (!) 150/93  Pulse:  (!) 119  Resp: 15 17  Temp:    SpO2:     patient appears in NAD Respiratory: Normal respiratory effort; CTA B Cardiovascular: RRR GI: soft, nt, nd Neuro: left leg with no movement  Review of Systems: Constitutional: positive for fever and chills Gastrointestinal: negative for diarrhea Integument/breast: negative for rash  Lab Results  Component Value Date   WBC 17.3 (H) 06/10/2021   HGB 7.4 (L) 06/10/2021   HCT 22.6 (L) 06/10/2021   MCV 85.3 06/10/2021   PLT 667 (H) 06/10/2021    Lab Results  Component Value Date   CREATININE 8.82 (H) 06/10/2021   BUN 61 (H) 06/10/2021   NA 129 (L) 06/10/2021   K 4.9 06/10/2021   CL 93 (L) 06/10/2021   CO2 22 06/10/2021    Lab Results  Component Value Date   ALT 16 06/08/2021   AST 25 06/08/2021   ALKPHOS 148 (H) 06/08/2021     Microbiology: Recent Results (from the past 240 hour(s))  Gastrointestinal Panel by PCR , Stool     Status: None   Collection Time: 06/01/21  5:27 AM   Specimen: Stool  Result Value Ref Range Status   Campylobacter species NOT DETECTED NOT DETECTED Final   Plesimonas shigelloides NOT DETECTED NOT DETECTED Final   Salmonella species NOT DETECTED NOT DETECTED Final   Yersinia enterocolitica NOT DETECTED NOT DETECTED Final   Vibrio species NOT DETECTED NOT DETECTED Final   Vibrio cholerae NOT DETECTED NOT DETECTED Final   Enteroaggregative E coli (EAEC) NOT DETECTED NOT DETECTED Final   Enteropathogenic E coli (EPEC) NOT DETECTED NOT DETECTED Final   Enterotoxigenic E coli (ETEC) NOT DETECTED NOT DETECTED Final   Shiga like toxin  producing E coli (STEC) NOT DETECTED NOT DETECTED Final   Shigella/Enteroinvasive E coli (EIEC) NOT DETECTED NOT DETECTED Final   Cryptosporidium NOT DETECTED NOT DETECTED Final   Cyclospora cayetanensis NOT DETECTED NOT DETECTED Final   Entamoeba histolytica NOT DETECTED NOT DETECTED Final   Giardia lamblia NOT DETECTED NOT DETECTED Final   Adenovirus F40/41 NOT DETECTED NOT DETECTED Final   Astrovirus NOT DETECTED NOT DETECTED Final   Norovirus GI/GII NOT DETECTED NOT DETECTED Final   Rotavirus A NOT DETECTED NOT DETECTED Final   Sapovirus (I, II, IV, and V) NOT DETECTED NOT DETECTED Final    Comment: Performed at [facility], [address] [place], [place] [address]  Culture, blood (routine x 2)     Status: None (Preliminary result)   Collection Time: 06/07/21 10:14 AM   Specimen: BLOOD  Result Value Ref Range Status   Specimen Description BLOOD RIGHT ANTECUBITAL  Final   Special Requests   Final    BOTTLES DRAWN AEROBIC ONLY Blood Culture adequate volume   Culture   Final    NO GROWTH 2 DAYS Performed at [facility], [address], [place], [place] [address]    Report Status PENDING  Incomplete  Culture, blood (routine x 2)     Status: None (Preliminary result)   Collection Time: 06/07/21 10:17 AM  Specimen: BLOOD RIGHT HAND  Result Value Ref Range Status   Specimen Description BLOOD RIGHT HAND  Final   Special Requests   Final    BOTTLES DRAWN AEROBIC ONLY Blood Culture adequate volume   Culture   Final    NO GROWTH 2 DAYS Performed at [facility], [address], [place], [place] [address]    Report Status PENDING  Incomplete  Gastrointestinal Panel by PCR , Stool     Status: None   Collection Time: 06/07/21  8:38 PM   Specimen: STOOL  Result Value Ref Range Status   Campylobacter species NOT DETECTED NOT DETECTED Final   Plesimonas shigelloides NOT DETECTED NOT DETECTED Final   Salmonella species NOT DETECTED NOT DETECTED Final   Yersinia  enterocolitica NOT DETECTED NOT DETECTED Final   Vibrio species NOT DETECTED NOT DETECTED Final   Vibrio cholerae NOT DETECTED NOT DETECTED Final   Enteroaggregative E coli (EAEC) NOT DETECTED NOT DETECTED Final   Enteropathogenic E coli (EPEC) NOT DETECTED NOT DETECTED Final   Enterotoxigenic E coli (ETEC) NOT DETECTED NOT DETECTED Final   Shiga like toxin producing E coli (STEC) NOT DETECTED NOT DETECTED Final   Shigella/Enteroinvasive E coli (EIEC) NOT DETECTED NOT DETECTED Final   Cryptosporidium NOT DETECTED NOT DETECTED Final   Cyclospora cayetanensis NOT DETECTED NOT DETECTED Final   Entamoeba histolytica NOT DETECTED NOT DETECTED Final   Giardia lamblia NOT DETECTED NOT DETECTED Final   Adenovirus F40/41 NOT DETECTED NOT DETECTED Final   Astrovirus NOT DETECTED NOT DETECTED Final   Norovirus GI/GII NOT DETECTED NOT DETECTED Final   Rotavirus A NOT DETECTED NOT DETECTED Final   Sapovirus (I, II, IV, and V) NOT DETECTED NOT DETECTED Final    Comment: Performed at [facility], [address] [place], [place] [address]  C Difficile Quick Screen (NO PCR Reflex)     Status: None   Collection Time: 06/07/21  8:38 PM   Specimen: STOOL  Result Value Ref Range Status   C Diff antigen NEGATIVE NEGATIVE Final   C Diff toxin NEGATIVE NEGATIVE Final   C Diff interpretation No C. difficile detected.  Final    Comment: Performed at [facility], [address], [place], [place] [address]    Impression/Plan:  1. Psoas abscess - MRI lumbar spine yesterday with continued fluid/abscess, no change in size. He continues to be febrile, still with leukocytosis.   With little change in the size, continued issues with fever, leukocytosis and no movement of his left leg, I recommend reevaluation by IR for consideration of drainage for source control.   2.  Acute renal failure - now on dialysis and temporary catheter.  With abscess above, fever, leukocytosis, it is ideal to postpone  placement of a tunneled catheter.    3.  fever - from #1, up to 101.8.  concern for the need for source control as above.    ADDENDUM: with the ongoing fever and concern for abscess, will change to vancomycin with dialysis and concern for psoas abscess vs hematoma.

## 2021-06-10 NOTE — Progress Notes (Signed)
[facility] Progress Note   Subjective:   Patient with another fever overnight.  Involving IR for possible drainage of psoas abscess.  Patient was seen on dialysis.  Felt well with no complaints.  Significant increase in urine output today with 1.5 L made  Objective Vitals:   06/10/21 1000 06/10/21 1030 06/10/21 1100 06/10/21 1124  BP: 134/79 (!) 158/88 (!) 150/93 (!) 149/92  Pulse:   (!) 119 (!) 118  Resp: 17 15 17 17   Temp:    (!) 97.1 F (36.2 C)  TempSrc:      SpO2:    96%  Weight:    85.5 kg  Height:       Physical Exam General: Lying in bed, no distress Heart: Tachycardic, 1+ pitting edema in the bilateral lower extremities Lungs: Bilateral chest rise with no increased work of breathing Abdomen: soft, mildly distended Extremities: Warm and well perfused Neuro:  awake and alert, oriented to person and place Dialysis Access:  R IJ  temp HD catheter -  placed 1/23  Additional Objective Labs: Basic Metabolic Panel: Recent Labs  Lab 06/08/21 1022 06/09/21 0359 06/10/21 0405  NA 131* 128* 129*  K 4.2 4.9 4.9  CL 100 93* 93*  CO2 20* 20* 22  GLUCOSE 109* 123* 106*  BUN 65* 46* 61*  CREATININE 8.30* 7.08* 8.82*  CALCIUM 6.8* 7.6* 7.9*  PHOS 9.2* 7.7* 8.3*   Liver Function Tests: Recent Labs  Lab 06/04/21 0248 06/05/21 0442 06/08/21 1022 06/09/21 0359 06/10/21 0405  AST 42*  --  25  --   --   ALT 110*  --  16  --   --   ALKPHOS 136*  --  148*  --   --   BILITOT 0.4  --  0.2*  --   --   PROT 4.8*  --  5.3*  --   --   ALBUMIN 1.8*   < > 1.8*   1.6* 1.8* 1.6*   < > = values in this interval not displayed.   No results for input(s): LIPASE, AMYLASE in the last 168 hours. CBC: Recent Labs  Lab 06/06/21 0333 06/07/21 0222 06/08/21 1022 06/09/21 0359 06/09/21 1950 06/10/21 0405  WBC 24.6* 24.1* 21.8* 19.5*  --  17.3*  NEUTROABS  --   --  18.5* 15.9*  --  14.0*  HGB 7.4* 7.5* 7.3* 6.8* 7.7* 7.4*  HCT 21.7* 22.1* 22.0* 20.4* 23.6* 22.6*   MCV 82.5 84.4 85.3 84.6  --  85.3  PLT 678* 762* 784* 700*  --  667*   Blood Culture    Component Value Date/Time   SDES BLOOD RIGHT HAND 06/07/2021 1017   SPECREQUEST  06/07/2021 1017    BOTTLES DRAWN AEROBIC ONLY Blood Culture adequate volume   CULT  06/07/2021 1017    NO GROWTH 2 DAYS Performed at [facility], [address] [place], [place]    REPTSTATUS PENDING 06/07/2021 1017    Cardiac Enzymes: Recent Labs  Lab 06/04/21 0248 06/05/21 0442 06/06/21 0333  CKTOTAL 783* 462* 432*   CBG: No results for input(s): GLUCAP in the last 168 hours.  Iron Studies:  No results for input(s): IRON, TIBC, TRANSFERRIN, FERRITIN in the last 72 hours.   @lablastinr3 @ Studies/Results: MR LUMBAR SPINE WO CONTRAST  Result Date: 06/09/2021 CLINICAL DATA:  Left leg complete paralysis EXAM: MRI LUMBAR SPINE WITHOUT CONTRAST TECHNIQUE: Multiplanar, multisequence MR imaging of the lumbar spine was performed. No intravenous contrast was  administered. COMPARISON:  05/31/2021 FINDINGS: Segmentation:  Standard. Alignment:  Physiologic. Vertebrae: No acute fracture or suspicious osseous lesion. No evidence of discitis. Conus medullaris and cauda equina: Conus extends to the L1-L2 level. Conus and cauda equina appear normal. Paraspinal and other soft tissues: Redemonstrated enlargement of the right iliopsoas muscle with a complex fluid collection, which is incompletely imaged but measures up to 3.3 x 2.6 cm in the axial plane, previously 3.4 x 2.6 cm when remeasured similarly. This fluid collection is now T1 isointense with a T1 hyperintense rim and T2 hyperintense with a T2 hypointense rim, previously T1 and T2 hypointense. Multiple smaller collections are also noted, the most prominent of which measures up to 0.7 cm (series 8, image 37), likely unchanged in size, with signal characteristics similar to the larger collection. Disc levels: T12-L1: No significant disc bulge. No spinal canal  stenosis or neural foraminal narrowing. L1-L2: No significant disc bulge. No spinal canal stenosis or neural foraminal narrowing. L2-L3: No significant disc bulge. No spinal canal stenosis or neural foraminal narrowing. L3-L4: Small central disc protrusion. Mild facet arthropathy. Narrowing of the lateral recesses. No spinal canal stenosis or neural foraminal narrowing. L4-L5: Mild disc bulge. Mild facet arthropathy. No spinal canal stenosis or neural foraminal narrowing. L5-S1: Mild disc bulge with central annular fissure. No spinal canal stenosis or neural foraminal narrowing. IMPRESSION: 1. Redemonstrated collections in the right iliopsoas muscle, the largest of which measures up to 3.3 cm, unchanged from the prior exam when remeasured similarly. These may represent evolving hematomas, given change in signal characteristics. 2. No spinal canal stenosis or significant neural foraminal narrowing. Electronically Signed   By: Wiliam KeAlison  Vasan M.D.   On: 06/09/2021 20:11   Medications:  sodium chloride Stopped (05/29/21 1602)    ceFAZolin (ANCEF) IV 1 g (06/09/21 2259)    (feeding supplement) PROSource Plus  30 mL Oral BID BM   calcium acetate  1,334 mg Oral TID WC   Chlorhexidine Gluconate Cloth  6 each Topical Q0600   Chlorhexidine Gluconate Cloth  6 each Topical Q0600   Chlorhexidine Gluconate Cloth  6 each Topical Q0600   darbepoetin (ARANESP) injection - NON-DIALYSIS  100 mcg Subcutaneous Q Mon-1800   feeding supplement (NEPRO CARB STEADY)  237 mL Oral QHS   folic acid  1 mg Per Tube Daily   gabapentin  100 mg Oral BID   Gerhardt's butt cream   Topical TID   heparin injection (subcutaneous)  5,000 Units Subcutaneous Q8H   multivitamin  1 tablet Oral QHS   pantoprazole  40 mg Oral BID    Assessment/ Plan: AKI severe: Baseline creatinine possibly around 2.7.  In setting of cardiac arrest, rhabdo.  Oliguric to anuric.  Required CRRT 1/15 - 1/19.   intermittent hemodialysis now on MWF schedule.   Definitive baseline for his creatinine is unknown but likely had some CKD prior to all of this.  We discussed placing a TDC but this is on hold due to ongoing fevers.  Patient is hesitant to undergo AVF creation at this time.  Urine output did pick up today.  Creatinine still going up between dialysis.  We will continue to monitor intradialytic creatinine rise and urine output. Unable to CLIP as AKI due to undocumented status. Could consider deeming ESRD in the next week or so if he remains HD dependent. SP cardiac arrest - asystolic arrest in setting of prob asp PNA/ ARDS in setting of EtOH Severe metabolic/ resp acidosis -   now resolved  LE neurologic deficits - ortho and neuro as well as NSG seeing-  thought is fluid in psoas muscle could be causing some compression-    Possible ischemic bowel - surgery consulting-  ex lap this past week did not show ischemic bowel needing resection but with some edema in bowel Psoas abscess: ID following. IR for draining Anemia- s/p transfusion on 1/31. No iron for now. Continue ESA. Bones-  phos very high- now on phoslo.Is improving.   Darnell Level  06/10/2021, 12:13 PM  [facility]

## 2021-06-10 NOTE — Progress Notes (Signed)
Physical Therapy Treatment Patient Details Name: Andres Braun MRN: [id number] DOB: [birth year] Today's Date: 06/10/2021   History of Present Illness 46 y.o. male admitted 05/24/21 after cardiac arrest at home; pt with agonal breathing, lost pulse, required 20-min CPR and 5 rounds epi. Workup for acute hypoxic respiratory failure, septic shock due to undertermined organism, AKI, acute metabolic encephalopathy, aspiration PNA of bilateral lower lobes due to vomit. ETT 1/15-1/19. CRRT 1/15-1/20. Brain MRI 1/22 negative. Cervical MRI 1/22 with small R-side disc protrusion C5-6. Abdominal CT showed muscular swelling, particularly iliopsoas and gluteal muscles consistent with rhabdomyolysis; hemorrhage noted within R iliopsoas. S/p diagnostic laparoscopy 1/24. MRI L femur 1/25 showed diffuse muscular abnormalities bilateral thighs with heterogenous T2 signal, especially within L glut max and gradratus femoris; findings compatible with dx of rhabdomyolysis, no drainable abscess identified; these findings could contribute to compartment syndrome. Unknown PMH.    PT Comments    Unable to progress to EOB this date due to increased burning and throbbing pain in bilateral LE. Patient with increased swelling noted to R LE compared to L with R knee warm to touch and R foot cool to touch. Patient with increased pain with PROM to ankles. Patient with difficulty moving R LE this date but able to activate internal rotators with hip adduction pillow squeeze. Continue to recommend CIR level therapies to assist with maximizing functional mobility and safety.    Recommendations for follow up therapy are one component of a multi-disciplinary discharge planning process, led by the attending physician.  Recommendations may be updated based on patient status, additional functional criteria and insurance authorization.  Follow Up Recommendations  Acute inpatient rehab (3hours/day)     Assistance Recommended at Discharge  Frequent or constant Supervision/Assistance  Patient can return home with the following Two people to help with walking and/or transfers;Help with stairs or ramp for entrance;A lot of help with bathing/dressing/bathroom;Assistance with cooking/housework;Assist for transportation;Direct supervision/assist for financial management;Direct supervision/assist for medications management;A lot of help with walking and/or transfers   Equipment Recommendations  Wheelchair (measurements PT);Wheelchair cushion (measurements PT);Other (comment);Hospital bed;BSC/3in1    Recommendations for Other Services       Precautions / Restrictions Precautions Precautions: Fall Restrictions Weight Bearing Restrictions: No     Mobility  Bed Mobility               General bed mobility comments: deferred mobility due to increased pain and discomfort with minor movements of LEs    Transfers                        Ambulation/Gait                   Stairs             Wheelchair Mobility    Modified Rankin (Stroke Patients Only)       Balance                                            Cognition Arousal/Alertness: Awake/alert Behavior During Therapy: Flat affect Overall Cognitive Status: Impaired/Different from baseline Area of Impairment: Orientation, Attention, Memory, Following commands, Safety/judgement, Awareness, Problem solving                 Orientation Level: Disoriented to, Time Current Attention Level: Sustained Memory: Decreased recall of precautions, Decreased short-term memory Following Commands:  Follows one step commands with increased time Safety/Judgement: Decreased awareness of safety, Decreased awareness of deficits Awareness: Intellectual Problem Solving: Slow processing, Difficulty sequencing          Exercises General Exercises - Lower Extremity Ankle Circles/Pumps: PROM, 5 reps, Both, Supine Other  Exercises Other Exercises: repositioned LEs into neutral position as patient was resting in externally rotated position. Discussed with RN about prevalon boots    General Comments General comments (skin integrity, edema, etc.): Increased swelling to R LE compared to L. Warm to the touch of R knee and R foot was cool to the touch compared to L LE      Pertinent Vitals/Pain Pain Assessment Pain Assessment: Faces Faces Pain Scale: Hurts whole lot Pain Location: bilateral LE Pain Descriptors / Indicators: Burning, Numbness, Moaning Pain Intervention(s): Limited activity within patient's tolerance, Monitored during session, Repositioned    Home Living                          Prior Function            PT Goals (current goals can now be found in the care plan section) Acute Rehab PT Goals Patient Stated Goal: to be able to stand PT Goal Formulation: With patient Time For Goal Achievement: 06/12/21 Potential to Achieve Goals: Fair Progress towards PT goals: Progressing toward goals    Frequency    Min 3X/week      PT Plan Current plan remains appropriate    Co-evaluation PT/OT/SLP Co-Evaluation/Treatment: Yes Reason for Co-Treatment: For patient/therapist safety PT goals addressed during session: Strengthening/ROM        AM-PAC PT "6 Clicks" Mobility   Outcome Measure  Help needed turning from your back to your side while in a flat bed without using bedrails?: A Lot Help needed moving from lying on your back to sitting on the side of a flat bed without using bedrails?: A Lot Help needed moving to and from a bed to a chair (including a wheelchair)?: Total Help needed standing up from a chair using your arms (e.g., wheelchair or bedside chair)?: Total Help needed to walk in hospital room?: Total Help needed climbing 3-5 steps with a railing? : Total 6 Click Score: 8    End of Session   Activity Tolerance: Patient limited by pain Patient left: in bed;with  call bell/phone within reach;with bed alarm set Nurse Communication: Mobility status PT Visit Diagnosis: Other abnormalities of gait and mobility (R26.89);Muscle weakness (generalized) (M62.81);Difficulty in walking, not elsewhere classified (R26.2);Unsteadiness on feet (R26.81)     Time: 0814-4818 PT Time Calculation (min) (ACUTE ONLY): 31 min  Charges:  $Therapeutic Exercise: 8-22 mins                     Tishawn Friedhoff A. Dan Humphreys PT, DPT Acute Rehabilitation Services Pager [phone] Office [phone]     Viviann Spare 06/10/2021, 2:45 PM

## 2021-06-10 NOTE — Progress Notes (Signed)
PROGRESS NOTE    Andres Braun  IOM:[id number] DOB: Sep 11, 1975 DOA: 05/24/2021 PCP: Merryl Hacker, No    Brief Narrative:  46 year old male admitted to ICU 1/15 under PCCM care after cardiac arrest at home.  PEA arrest, s/p CPR x20 minutes and epi x5.  Patient reportedly out drinking with his friends the night prior to admission.  Found by friends with agonal breathing.  Intubated on arrival, needed vasopressors, noted to have renal failure, rhabdomyolysis with CK in the 14 K range, treated for aspiration pneumonia, nephrology consulted and started on CRRT.  Extubated 1/19 and off pressors.  Mental status slowly improved.  Transferred to telemetry.  [facility] transferred to [facility] on 1/24.  Nephrology following for HD needs.  General surgery on board for questionable small bowel ischemia and taken emergently to surgery 1/24. The bowel in question was found to be thickened, but viable. Hospital course complicated by unexplained left lower extremity weakness. Extensive work up of the neuraxis is negative for compressive pathology. Differential is probably a psoas hematoma causing lumbar plexus compression/ irritation leading to secondary weakness. IR suggested that it is not amenable for aspiration or drain placement. Neurology consulted, suggested not a neurological injury as the neuraxis is negative.     Consultants:  Nephrology Surgery.  Orthopedics Dr Dr Sammuel Hines. ID  PCCM NeurologyD, nephrology  Procedures:  Exploratory laparotomy Mechanical ventilation CRRT Hemodialysis Antimicrobials:  Vancomycin Cefazolin   Subjective: C/o lower extremity up to waiste pain /bl. No sob, cp  Objective: Vitals:   06/10/21 1100 06/10/21 1124 06/10/21 1213 06/10/21 1637  BP: (!) 150/93 (!) 149/92 (!) 153/84 139/77  Pulse: (!) 119 (!) 118 (!) 121 (!) 122  Resp: 17 17 18 18   Temp:  (!) 97.1 F (36.2 C) 99.6 F (37.6 C) 100.3 F (37.9 C)  TempSrc:   Oral Oral  SpO2:  96% 99% 98%  Weight:  85.5 kg     Height:        Intake/Output Summary (Last 24 hours) at 06/10/2021 1659 Last data filed at 06/10/2021 1646 Gross per 24 hour  Intake 1027 ml  Output 1600 ml  Net -573 ml   Filed Weights   06/10/21 0500 06/10/21 0739 06/10/21 1124  Weight: 86.9 kg 87 kg 85.5 kg    Examination:  General exam: Appears calm and comfortable  Respiratory system: Clear to auscultation. Respiratory effort normal. Cardiovascular system: S1 & S2 heard, RRR. No JVD, murmurs, rubs, gallops or clicks.  Gastrointestinal system: Abdomen is nondistended, soft and nontender.  Normal bowel sounds heard. Central nervous system: Alert and  awakeGrossly intact Extremities: no edema.  Psychiatry: . Mood & affect appropriate.     Data Reviewed: I have personally reviewed following labs and imaging studies  CBC: Recent Labs  Lab 06/04/21 0248 06/06/21 0333 06/07/21 0222 06/08/21 1022 06/09/21 0359 06/09/21 1950 06/10/21 0405  WBC 21.9* 24.6* 24.1* 21.8* 19.5*  --  17.3*  NEUTROABS 17.2*  --   --  18.5* 15.9*  --  14.0*  HGB 8.2* 7.4* 7.5* 7.3* 6.8* 7.7* 7.4*  HCT 23.4* 21.7* 22.1* 22.0* 20.4* 23.6* 22.6*  MCV 81.3 82.5 84.4 85.3 84.6  --  85.3  PLT 542* 678* 762* 784* 700*  --  845*   Basic Metabolic Panel: Recent Labs  Lab 06/06/21 0333 06/07/21 0222 06/08/21 1022 06/09/21 0359 06/10/21 0405  NA 130* 129* 131* 128* 129*  K 4.4 4.4 4.2 4.9 4.9  CL 93* 93* 100 93* 93*  CO2 22 24 20*  20* 22  GLUCOSE 108* 107* 109* 123* 106*  BUN 95* 53* 65* 46* 61*  CREATININE 9.03* 6.60* 8.30* 7.08* 8.82*  CALCIUM 7.4* 7.6* 6.8* 7.6* 7.9*  PHOS 11.0* 7.9* 9.2* 7.7* 8.3*   GFR: Estimated Creatinine Clearance: 11.6 mL/min (A) (by C-G formula based on SCr of 8.82 mg/dL (H)). Liver Function Tests: Recent Labs  Lab 06/04/21 0248 06/05/21 0442 06/06/21 0333 06/07/21 0222 06/08/21 1022 06/09/21 0359 06/10/21 0405  AST 42*  --   --   --  25  --   --   ALT 110*  --   --   --  16  --   --   ALKPHOS 136*  --    --   --  148*  --   --   BILITOT 0.4  --   --   --  0.2*  --   --   PROT 4.8*  --   --   --  5.3*  --   --   ALBUMIN 1.8*   < > 1.8* 1.8* 1.8*   1.6* 1.8* 1.6*   < > = values in this interval not displayed.   No results for input(s): LIPASE, AMYLASE in the last 168 hours. No results for input(s): AMMONIA in the last 168 hours. Coagulation Profile: No results for input(s): INR, PROTIME in the last 168 hours. Cardiac Enzymes: Recent Labs  Lab 06/04/21 0248 06/05/21 0442 06/06/21 0333  CKTOTAL 783* 462* 432*   BNP (last 3 results) No results for input(s): PROBNP in the last 8760 hours. HbA1C: No results for input(s): HGBA1C in the last 72 hours. CBG: No results for input(s): GLUCAP in the last 168 hours. Lipid Profile: No results for input(s): CHOL, HDL, LDLCALC, TRIG, CHOLHDL, LDLDIRECT in the last 72 hours. Thyroid Function Tests: No results for input(s): TSH, T4TOTAL, FREET4, T3FREE, THYROIDAB in the last 72 hours. Anemia Panel: No results for input(s): VITAMINB12, FOLATE, FERRITIN, TIBC, IRON, RETICCTPCT in the last 72 hours. Sepsis Labs: Recent Labs  Lab 06/06/21 2008 06/07/21 0222 06/08/21 1022  PROCALCITON 0.86 0.93 0.80    Recent Results (from the past 240 hour(s))  Gastrointestinal Panel by PCR , Stool     Status: None   Collection Time: 06/01/21  5:27 AM   Specimen: Stool  Result Value Ref Range Status   Campylobacter species NOT DETECTED NOT DETECTED Final   Plesimonas shigelloides NOT DETECTED NOT DETECTED Final   Salmonella species NOT DETECTED NOT DETECTED Final   Yersinia enterocolitica NOT DETECTED NOT DETECTED Final   Vibrio species NOT DETECTED NOT DETECTED Final   Vibrio cholerae NOT DETECTED NOT DETECTED Final   Enteroaggregative E coli (EAEC) NOT DETECTED NOT DETECTED Final   Enteropathogenic E coli (EPEC) NOT DETECTED NOT DETECTED Final   Enterotoxigenic E coli (ETEC) NOT DETECTED NOT DETECTED Final   Shiga like toxin producing E coli (STEC) NOT  DETECTED NOT DETECTED Final   Shigella/Enteroinvasive E coli (EIEC) NOT DETECTED NOT DETECTED Final   Cryptosporidium NOT DETECTED NOT DETECTED Final   Cyclospora cayetanensis NOT DETECTED NOT DETECTED Final   Entamoeba histolytica NOT DETECTED NOT DETECTED Final   Giardia lamblia NOT DETECTED NOT DETECTED Final   Adenovirus F40/41 NOT DETECTED NOT DETECTED Final   Astrovirus NOT DETECTED NOT DETECTED Final   Norovirus GI/GII NOT DETECTED NOT DETECTED Final   Rotavirus A NOT DETECTED NOT DETECTED Final   Sapovirus (I, II, IV, and V) NOT DETECTED NOT DETECTED Final    Comment: Performed  at [facility], [address] [place], [place] [address]  Culture, blood (routine x 2)     Status: None (Preliminary result)   Collection Time: 06/07/21 10:14 AM   Specimen: BLOOD  Result Value Ref Range Status   Specimen Description BLOOD RIGHT ANTECUBITAL  Final   Special Requests   Final    BOTTLES DRAWN AEROBIC ONLY Blood Culture adequate volume   Culture   Final    NO GROWTH 3 DAYS Performed at [facility] [address], [place], [place] [address]    Report Status PENDING  Incomplete  Culture, blood (routine x 2)     Status: None (Preliminary result)   Collection Time: 06/07/21 10:17 AM   Specimen: BLOOD RIGHT HAND  Result Value Ref Range Status   Specimen Description BLOOD RIGHT HAND  Final   Special Requests   Final    BOTTLES DRAWN AEROBIC ONLY Blood Culture adequate volume   Culture   Final    NO GROWTH 3 DAYS Performed at [facility], [place] [address] [place], [place] [address]    Report Status PENDING  Incomplete  Gastrointestinal Panel by PCR , Stool     Status: None   Collection Time: 06/07/21  8:38 PM   Specimen: STOOL  Result Value Ref Range Status   Campylobacter species NOT DETECTED NOT DETECTED Final   Plesimonas shigelloides NOT DETECTED NOT DETECTED Final   Salmonella species NOT DETECTED NOT DETECTED Final   Yersinia enterocolitica NOT DETECTED  NOT DETECTED Final   Vibrio species NOT DETECTED NOT DETECTED Final   Vibrio cholerae NOT DETECTED NOT DETECTED Final   Enteroaggregative E coli (EAEC) NOT DETECTED NOT DETECTED Final   Enteropathogenic E coli (EPEC) NOT DETECTED NOT DETECTED Final   Enterotoxigenic E coli (ETEC) NOT DETECTED NOT DETECTED Final   Shiga like toxin producing E coli (STEC) NOT DETECTED NOT DETECTED Final   Shigella/Enteroinvasive E coli (EIEC) NOT DETECTED NOT DETECTED Final   Cryptosporidium NOT DETECTED NOT DETECTED Final   Cyclospora cayetanensis NOT DETECTED NOT DETECTED Final   Entamoeba histolytica NOT DETECTED NOT DETECTED Final   Giardia lamblia NOT DETECTED NOT DETECTED Final   Adenovirus F40/41 NOT DETECTED NOT DETECTED Final   Astrovirus NOT DETECTED NOT DETECTED Final   Norovirus GI/GII NOT DETECTED NOT DETECTED Final   Rotavirus A NOT DETECTED NOT DETECTED Final   Sapovirus (I, II, IV, and V) NOT DETECTED NOT DETECTED Final    Comment: Performed at [facility], [address] [place], [place] [address]  C Difficile Quick Screen (NO PCR Reflex)     Status: None   Collection Time: 06/07/21  8:38 PM   Specimen: STOOL  Result Value Ref Range Status   C Diff antigen NEGATIVE NEGATIVE Final   C Diff toxin NEGATIVE NEGATIVE Final   C Diff interpretation No C. difficile detected.  Final    Comment: Performed at [facility], [place] [address], [place], [place] [address]         Radiology Studies: MR LUMBAR SPINE WO CONTRAST  Result Date: 06/09/2021 CLINICAL DATA:  Left leg complete paralysis EXAM: MRI LUMBAR SPINE WITHOUT CONTRAST TECHNIQUE: Multiplanar, multisequence MR imaging of the lumbar spine was performed. No intravenous contrast was administered. COMPARISON:  05/31/2021 FINDINGS: Segmentation:  Standard. Alignment:  Physiologic. Vertebrae: No acute fracture or suspicious osseous lesion. No evidence of discitis. Conus medullaris and cauda equina: Conus extends to the L1-L2  level. Conus and  cauda equina appear normal. Paraspinal and other soft tissues: Redemonstrated enlargement of the right iliopsoas muscle with a complex fluid collection, which is incompletely imaged but measures up to 3.3 x 2.6 cm in the axial plane, previously 3.4 x 2.6 cm when remeasured similarly. This fluid collection is now T1 isointense with a T1 hyperintense rim and T2 hyperintense with a T2 hypointense rim, previously T1 and T2 hypointense. Multiple smaller collections are also noted, the most prominent of which measures up to 0.7 cm (series 8, image 37), likely unchanged in size, with signal characteristics similar to the larger collection. Disc levels: T12-L1: No significant disc bulge. No spinal canal stenosis or neural foraminal narrowing. L1-L2: No significant disc bulge. No spinal canal stenosis or neural foraminal narrowing. L2-L3: No significant disc bulge. No spinal canal stenosis or neural foraminal narrowing. L3-L4: Small central disc protrusion. Mild facet arthropathy. Narrowing of the lateral recesses. No spinal canal stenosis or neural foraminal narrowing. L4-L5: Mild disc bulge. Mild facet arthropathy. No spinal canal stenosis or neural foraminal narrowing. L5-S1: Mild disc bulge with central annular fissure. No spinal canal stenosis or neural foraminal narrowing. IMPRESSION: 1. Redemonstrated collections in the right iliopsoas muscle, the largest of which measures up to 3.3 cm, unchanged from the prior exam when remeasured similarly. These may represent evolving hematomas, given change in signal characteristics. 2. No spinal canal stenosis or significant neural foraminal narrowing. Electronically Signed   By: Merilyn Baba M.D.   On: 06/09/2021 20:11        Scheduled Meds:  (feeding supplement) PROSource Plus  30 mL Oral BID BM   calcium acetate  1,334 mg Oral TID WC   Chlorhexidine Gluconate Cloth  6 each Topical Q0600   Chlorhexidine Gluconate Cloth  6 each Topical Q0600    Chlorhexidine Gluconate Cloth  6 each Topical Q0600   darbepoetin (ARANESP) injection - NON-DIALYSIS  100 mcg Subcutaneous Q Mon-1800   feeding supplement (NEPRO CARB STEADY)  237 mL Oral QHS   folic acid  1 mg Per Tube Daily   gabapentin  100 mg Oral BID   Gerhardt's butt cream   Topical TID   heparin injection (subcutaneous)  5,000 Units Subcutaneous Q8H   multivitamin  1 tablet Oral QHS   pantoprazole  40 mg Oral BID   Continuous Infusions:  sodium chloride Stopped (05/29/21 1602)   vancomycin 1,000 mg (06/10/21 1646)    Assessment & Plan:   Principal Problem:   Cardiac arrest with pulseless electrical activity ([place]) Active Problems:   Acute respiratory failure with hypoxia and hypercapnia (HCC)   Septic shock due to undetermined organism ([place])   AKI (acute kidney injury) ([place])   Acute metabolic encephalopathy   Aspiration pneumonia of both lower lobes due to vomit ([place])   Protein-calorie malnutrition, severe   Acute hypoxic respiratory failure/aspiration pneumonia Patient intubated on 05/24/2021 and extubated on 05/28/2021 . 2/1 patient completed course of antibiotics for aspiration pneumonia  Currently on room air       Ischemic enteritis versus colitis with ileus General surgery consulted, there was concerned about an ischemic bowel.  Patient was taken to the OR for diagnostic laparoscopy, surgery revealed bowel in question is thickened,  no ischemia was seen.  hence no further intervention at this time.  Advance diet as tolerated without any issues at this time. Diarrhea improved with imodium.  2/1 C. difficile PCR and GI panel negative  Continue to monitor         Lower extremity  weakness with left foot drop:  -MRI of the neuraxis  was unremarkable for acute process .  As per the MRI of the lumbar spine, multiple fluid collections in the right ileo psoas muscle.  Dr Radene Gunning spoke with neurology and neuro surgery on 1/24, no surgical cause for his lower extremity  weakness. He suggested that a psoas abscess or hematoma could cause lumbar plexus compression /irritation leading to secondary weakness.   IR consulted for aspiration, suggested any aspiration would predispose to infection/ not amenable for aspiration or drain placement. Did not advise aspiration.  MRI of the femur shows Diffuse muscular abnormalities throughout the visualized portions of both thighs with heterogeneous T2 signal and enhancement, especially within the left gluteus maximus and quadratus femoris muscles. Findings are compatible with the diagnosis of rhabdomyolysis, with the differential including myositis related to connective tissue disease and polymyositis. No drainable abscess identified. pt unable to move the left lower extremity from severe pain and weakness.  Orthopedics consulted, suggested its a neurological/ lumbar plexus ischemic injury.  Hoping for recovery with therapy evaluations.  CK levels are improving. Vitamin b12 and folate levels wnl. ESR is 85, CRP is 5.4   Pt has good pulse in DP, no sensory deficits. No signs of compartment syndrome.  2/1 continue gabapentin teen  Therapy recommended CIR         Acute metabolic encephalopathy: Resolved  Answers questions appropriately        Right anterior knee wound:  -wound care consulted.      Acute kidney injury with metabolic acidosis/rhabdomyolysis Patient has been off CRRT as of 05/28/2021. Nephrology on board and appreciate recommendations.  Hoping for renal recovery, if no recovery will need a perm cath next week.   Improving CK levels.     Hyponatremia -Secondary to AKI     Severe protein calorie malnutrition. Supplementation added. Albumin around .1.8   Mechanical fall 1/24 and hit his head -As per nursing report, prior to going to the OR, patient attempted to get out of the bed, fell and hit his head without any obvious external injuries or LOC.  Patient's friend was in the room. Advised  nursing regarding strict fall precautions, placement of safety sitter if needed and monitor closely.     Anemia of acute illness/  acute anemia of blood loss from hematoma in the psoas muscle  Hemoglobin on admission is around 15.9 to  12.6 to 11.8 to 10.5 to 9.9 to 8.7 to 8.2 to 7.5 to 7.3 to 6.8 today.  Transfuse  to keep hemoglobin greater than 7. 1 unit of prbc transfusion ordered, recheck H&H tonight.  Iron deficiency on anemia panel. Will need supplementation on discharge.  Stool for occult blood is positive on 05/30/21. Suspect from colitis.          Elevated liver enzymes:  Probably from ETOH abuse, shock liver and rhabdomyolysis.  Much improved.      Fever today, with improving Leukocytosis:  Reactive or from muscle necrosis. Blood cultures are negative so far.  Procalcitonin is 0.8. on cefazolin for muscle necrosis. C diff pcr is negative and GI panel is negative  Unable to get UA as pt is incontinent.  ID on board and following. Ordered repeat MRI of the lumbar spine.      Thrombocytosis:  Reactive or from infection   DVT prophylaxis: Heparin Code Status: Full Family Communication: None at bedside Disposition Plan:  Status is: Inpatient Remains inpatient appropriate because: Unsafe discharge plan  LOS: 17 days   Time spent: 35 minutes with more than 50% on [place], MD [facility] Pager [phone]  If 7PM-7AM, please contact night-coverage 06/10/2021, 4:59 PM

## 2021-06-10 NOTE — Procedures (Signed)
I was present at this dialysis session. I have reviewed the session itself and made appropriate changes.   Filed Weights   06/10/21 0500 06/10/21 0739 06/10/21 1124  Weight: 86.9 kg 87 kg 85.5 kg    Recent Labs  Lab 06/10/21 0405  NA 129*  K 4.9  CL 93*  CO2 22  GLUCOSE 106*  BUN 61*  CREATININE 8.82*  CALCIUM 7.9*  PHOS 8.3*    Recent Labs  Lab 06/08/21 1022 06/09/21 0359 06/09/21 1950 06/10/21 0405  WBC 21.8* 19.5*  --  17.3*  NEUTROABS 18.5* 15.9*  --  14.0*  HGB 7.3* 6.8* 7.7* 7.4*  HCT 22.0* 20.4* 23.6* 22.6*  MCV 85.3 84.6  --  85.3  PLT 784* 700*  --  667*    Scheduled Meds:  (feeding supplement) PROSource Plus  30 mL Oral BID BM   calcium acetate  1,334 mg Oral TID WC   Chlorhexidine Gluconate Cloth  6 each Topical Q0600   Chlorhexidine Gluconate Cloth  6 each Topical Q0600   Chlorhexidine Gluconate Cloth  6 each Topical Q0600   darbepoetin (ARANESP) injection - NON-DIALYSIS  100 mcg Subcutaneous Q Mon-1800   feeding supplement (NEPRO CARB STEADY)  237 mL Oral QHS   folic acid  1 mg Per Tube Daily   gabapentin  100 mg Oral BID   Gerhardt's butt cream   Topical TID   heparin injection (subcutaneous)  5,000 Units Subcutaneous Q8H   multivitamin  1 tablet Oral QHS   pantoprazole  40 mg Oral BID   Continuous Infusions:  sodium chloride Stopped (05/29/21 1602)    ceFAZolin (ANCEF) IV 1 g (06/09/21 2259)   PRN Meds:.acetaminophen, albuterol, guaiFENesin-dextromethorphan, hydrALAZINE, morphine injection, oxyCODONE   Louie Bun,  MD 06/10/2021, 12:17 PM

## 2021-06-10 NOTE — Plan of Care (Signed)
  Problem: Activity: Goal: Risk for activity intolerance will decrease Outcome: Not Progressing   

## 2021-06-10 NOTE — Progress Notes (Signed)
°   06/10/21 0322  Assess: MEWS Score  Temp (!) 101.8 F (38.8 C)  BP (!) 150/82  Pulse Rate (!) 126  Resp 20  Level of Consciousness Alert  SpO2 97 %  O2 Device Room Air  Assess: MEWS Score  MEWS Temp 2  MEWS Systolic 0  MEWS Pulse 2  MEWS RR 0  MEWS LOC 0  MEWS Score 4  MEWS Score Color Red  Assess: if the MEWS score is Yellow or Red  Were vital signs taken at a resting state? Yes  Focused Assessment Change from prior assessment (see assessment flowsheet)  Early Detection of Sepsis Score *See Row Information* High  MEWS guidelines implemented *See Row Information* Yes  Treat  MEWS Interventions Administered scheduled meds/treatments;Administered prn meds/treatments;Escalated (See documentation below)  Take Vital Signs  Increase Vital Sign Frequency  Red: Q 1hr X 4 then Q 4hr X 4, if remains red, continue Q 4hrs  Escalate  MEWS: Escalate Red: discuss with charge nurse/RN and provider, consider discussing with RRT  Notify: Charge Nurse/RN  Name of Charge Nurse/RN Notified Emman, RN  Date Charge Nurse/RN Notified 06/10/21  Time Charge Nurse/RN Notified [phone]  Notify: Provider  Provider Name/Title Chotiner, MD  Date Provider Notified 06/10/21  Time Provider Notified [phone]  Notification Type Page  Notification Reason Change in status  Date of Provider Response 06/10/21  Time of Provider Response 0335  Document  Patient Outcome Not stable and remains on department  Progress note created (see row info) Yes

## 2021-06-10 NOTE — Progress Notes (Signed)
Pharmacy Antibiotic Note  Andres Braun is a 46 y.o. male admitted on 05/24/2021 and now with concern for psoas abscess. Given fevers - plan is to broaden from Cefazolin to also cover for MRSA. AKI so best to avoid Vancomycin however noted elevated CK earlier this admission. Will give a 1-time dose of Vancomycin this afternoon to cover and recheck CK in the morning - if trended down, will switch over to Daptomycin.   Plan: - Vancomycin 1g IV x 1 dose now - Will order CK with AM labs - and if acceptable, transition over to Daptomycin to avoid Vanc with recovering AKI - Will follow along with ID plans for abx/LOT, renal changes for dose adjustments  Height: 6' (182.9 cm) Weight: 85.5 kg (188 lb 7.9 oz) IBW/kg (Calculated) : 77.6  Temp (24hrs), Avg:99.5 F (37.5 C), Min:97.1 F (36.2 C), Max:101.8 F (38.8 C)  Recent Labs  Lab 06/06/21 0333 06/07/21 0222 06/08/21 1022 06/09/21 0359 06/10/21 0405  WBC 24.6* 24.1* 21.8* 19.5* 17.3*  CREATININE 9.03* 6.60* 8.30* 7.08* 8.82*    Estimated Creatinine Clearance: 11.6 mL/min (A) (by C-G formula based on SCr of 8.82 mg/dL (H)).    No Known Allergies  Antimicrobials this admission: Ceftriaxone 1/15 >> 1/19; 1/21 >> 1/23 Metronidazole 1/21 >> 1/23 Zosyn 1/24 >> 1/27 Cefazolin 1/27 >> 1/31 Vancomycin 2/1 x 1  Dose adjustments this admission:   Microbiology results: 1/15 COVID/flu >> neg 1/15 MRSA PCR >> neg 1/23 GI panel >> neg 1/29 CDiff >> neg 1/29 BCx >> [id number]  Thank you for allowing pharmacy to be a part of this patients care.  Georgina Pillion, PharmD, BCPS Clinical Pharmacist 06/10/2021 4:25 PM   **Pharmacist phone directory can now be found on [email or web address] ([email or web address]).  Listed under [facility].

## 2021-06-10 NOTE — Progress Notes (Signed)
Occupational Therapy Treatment Patient Details Name: Andres Braun MRN: [id number] DOB: [birth year] Today's Date: 06/10/2021   History of present illness 46 y.o. male admitted 05/24/21 after cardiac arrest at home; pt with agonal breathing, lost pulse, required 20-min CPR and 5 rounds epi. Workup for acute hypoxic respiratory failure, septic shock due to undertermined organism, AKI, acute metabolic encephalopathy, aspiration PNA of bilateral lower lobes due to vomit. ETT 1/15-1/19. CRRT 1/15-1/20. Brain MRI 1/22 negative. Cervical MRI 1/22 with small R-side disc protrusion C5-6. Abdominal CT showed muscular swelling, particularly iliopsoas and gluteal muscles consistent with rhabdomyolysis; hemorrhage noted within R iliopsoas. S/p diagnostic laparoscopy 1/24. MRI L femur 1/25 showed diffuse muscular abnormalities bilateral thighs with heterogenous T2 signal, especially within L glut max and gradratus femoris; findings compatible with dx of rhabdomyolysis, no drainable abscess identified; these findings could contribute to compartment syndrome. Unknown PMH.   OT comments  Pt very limited by pain this session in bil LE with throbbing pain from hips to feet. Pt in prior session has been standing in the stedy. Pt unable to even dangle EOB this session. Pt with PAIN being the barrier. Pt's R knee has increased edema and cool skin feel to the foot. Pt reports today has been very bad pain and being a very bad day. RN made aware of barriers. Recommendation remains CIR as pt with pain control could benefit from intense rehab.    Recommendations for follow up therapy are one component of a multi-disciplinary discharge planning process, led by the attending physician.  Recommendations may be updated based on patient status, additional functional criteria and insurance authorization.    Follow Up Recommendations  Acute inpatient rehab (3hours/day)    Assistance Recommended at Discharge Frequent or constant  Supervision/Assistance  Patient can return home with the following  Two people to help with walking and/or transfers;Two people to help with bathing/dressing/bathroom;Assistance with cooking/housework;Assist for transportation;Direct supervision/assist for medications management;Help with stairs or ramp for entrance   Equipment Recommendations  BSC/3in1;Wheelchair (measurements OT);Wheelchair cushion (measurements OT)    Recommendations for Other Services Rehab consult    Precautions / Restrictions Precautions Precautions: Fall Restrictions Weight Bearing Restrictions: No       Mobility Bed Mobility               General bed mobility comments: deferred mobility due to increased pain and discomfort with minor movements of LEs    Transfers                         Balance                                           ADL either performed or assessed with clinical judgement   ADL Overall ADL's : Needs assistance/impaired Eating/Feeding: Modified independent Eating/Feeding Details (indicate cue type and reason): eating spaghetti Grooming: Wash/dry face;Modified independent               Lower Body Dressing: Total assistance Lower Body Dressing Details (indicate cue type and reason): don socks. removed at end of session due to edema               General ADL Comments: pt very limted this session. pt with UB movement due to pain caused by LE input. pt has been progressing to stedy in prior sessions and very limited by pain.  Extremity/Trunk Assessment     Lower Extremity Assessment Lower Extremity Assessment: Defer to PT evaluation RLE Deficits / Details: increased pain, knee warm, foot is cold to touch compared to L foot increased edema        Vision       Perception     Praxis      Cognition Arousal/Alertness: Awake/alert Behavior During Therapy: Flat affect Overall Cognitive Status: Impaired/Different from  baseline Area of Impairment: Orientation, Attention, Memory, Following commands, Safety/judgement, Awareness, Problem solving                 Orientation Level: Disoriented to, Time Current Attention Level: Sustained Memory: Decreased recall of precautions, Decreased short-term memory Following Commands: Follows one step commands with increased time Safety/Judgement: Decreased awareness of safety, Decreased awareness of deficits Awareness: Intellectual Problem Solving: Slow processing, Difficulty sequencing General Comments: pt reports nausea after eating . pt able to verbalize information consistent with his days sequences of HD , prima fit leaking into the bed. pt with poor awareness overall for times as pt will say yesterday adn it might be a few days ago        Exercises Exercises: Other exercises General Exercises - Lower Extremity Ankle Circles/Pumps: PROM, 5 reps, Both, Supine Other Exercises Other Exercises: repositioned LEs into neutral position as patient was resting in externally rotated position. Discussed with RN about prevalon boots Other Exercises: pt unabel to tolerate BIL Knee fleioxn this session- pt calling out from discomfort.    Shoulder Instructions       General Comments Increased swelling to R LE compared to L. Warm to the touch of R knee and R foot was cool to the touch compared to L LE    Pertinent Vitals/ Pain       Pain Assessment Pain Assessment: Faces Faces Pain Scale: Hurts whole lot Pain Location: bilateral LE Pain Descriptors / Indicators: Burning, Numbness, Moaning Pain Intervention(s): Monitored during session, Premedicated before session, Repositioned  Home Living                                          Prior Functioning/Environment              Frequency  Min 2X/week        Progress Toward Goals  OT Goals(current goals can now be found in the care plan section)  Progress towards OT goals: Not  progressing toward goals - comment  Acute Rehab OT Goals Patient Stated Goal: pain "i can't it hurts" OT Goal Formulation: With patient Time For Goal Achievement: 06/12/21 Potential to Achieve Goals: Good ADL Goals Pt Will Perform Grooming: with set-up;with supervision;sitting Pt Will Perform Upper Body Bathing: with set-up;with supervision;sitting Pt Will Perform Lower Body Bathing: with min assist;sit to/from stand Pt Will Transfer to Toilet: with min assist;stand pivot transfer;bedside commode Additional ADL Goal #1: Pt will be Min A in and OOB for basic ADLs Additional ADL Goal #2: continue to assess vision Additional ADL Goal #3: Pt will follow 75% of one step commands on first try  Plan Discharge plan remains appropriate    Co-evaluation    PT/OT/SLP Co-Evaluation/Treatment: Yes Reason for Co-Treatment: Necessary to address cognition/behavior during functional activity;For patient/therapist safety;To address functional/ADL transfers PT goals addressed during session: Strengthening/ROM OT goals addressed during session: ADL's and self-care;Proper use of Adaptive equipment and DME;Strengthening/ROM      AM-PAC OT "  6 Clicks" Daily Activity     Outcome Measure   Help from another person eating meals?: A Little Help from another person taking care of personal grooming?: A Little Help from another person toileting, which includes using toliet, bedpan, or urinal?: A Lot Help from another person bathing (including washing, rinsing, drying)?: A Lot Help from another person to put on and taking off regular upper body clothing?: A Little Help from another person to put on and taking off regular lower body clothing?: Total 6 Click Score: 14    End of Session    OT Visit Diagnosis: Unsteadiness on feet (R26.81);Other abnormalities of gait and mobility (R26.89);Muscle weakness (generalized) (M62.81);Other symptoms and signs involving cognitive function;Pain Pain - Right/Left:  Left Pain - part of body: Leg   Activity Tolerance Patient limited by pain   Patient Left in bed;with call bell/phone within reach;with bed alarm set;with nursing/sitter in room   Nurse Communication Mobility status;Precautions        Time: 1400 [id number] OT Time Calculation (min): 28 min  Charges: OT General Charges $OT Visit: 1 Visit OT Treatments $Self Care/Home Management : 8-22 mins   Brynn, OTR/L  Acute Rehabilitation Services Pager: [phone] Office: [phone] .   Jeri Modena 06/10/2021, 4:08 PM

## 2021-06-11 LAB — CBC
HCT: 23.5 % — ABNORMAL LOW (ref 39.0–52.0)
Hemoglobin: 7.4 g/dL — ABNORMAL LOW (ref 13.0–17.0)
MCH: 27.8 pg (ref 26.0–34.0)
MCHC: 31.5 g/dL (ref 30.0–36.0)
MCV: 88.3 fL (ref 80.0–100.0)
Platelets: 693 10*3/uL — ABNORMAL HIGH (ref 150–400)
RBC: 2.66 MIL/uL — ABNORMAL LOW (ref 4.22–5.81)
RDW: 14.4 % (ref 11.5–15.5)
WBC: 13.1 10*3/uL — ABNORMAL HIGH (ref 4.0–10.5)
nRBC: 0 % (ref 0.0–0.2)

## 2021-06-11 LAB — RENAL FUNCTION PANEL
Albumin: 1.5 g/dL — ABNORMAL LOW (ref 3.5–5.0)
Anion gap: 11 (ref 5–15)
BUN: 37 mg/dL — ABNORMAL HIGH (ref 6–20)
CO2: 26 mmol/L (ref 22–32)
Calcium: 7.8 mg/dL — ABNORMAL LOW (ref 8.9–10.3)
Chloride: 95 mmol/L — ABNORMAL LOW (ref 98–111)
Creatinine, Ser: 6.01 mg/dL — ABNORMAL HIGH (ref 0.61–1.24)
GFR, Estimated: 11 mL/min — ABNORMAL LOW (ref >60)
Glucose, Bld: 118 mg/dL — ABNORMAL HIGH (ref 70–99)
Phosphorus: 5.9 mg/dL — ABNORMAL HIGH (ref 2.5–4.6)
Potassium: 4.1 mmol/L (ref 3.5–5.1)
Sodium: 132 mmol/L — ABNORMAL LOW (ref 135–145)

## 2021-06-11 LAB — CK: Total CK: 131 U/L (ref 49–397)

## 2021-06-11 MED ORDER — SODIUM CHLORIDE 0.9 % IV SOLN
8.0000 mg/kg | INTRAVENOUS | Status: DC
  Filled 2021-06-11: qty 14

## 2021-06-11 MED ORDER — SODIUM CHLORIDE 0.9 % IV SOLN
8.0000 mg/kg | Freq: Every day | INTRAVENOUS | Status: DC
  Filled 2021-06-11: qty 14

## 2021-06-11 NOTE — Progress Notes (Signed)
[facility] Progress Note   Subjective:   Patient states he feels a lot better today.  Urine output has been fairly high the last 2 days with greater than 1.6 L made.  Objective Vitals:   06/10/21 2046 06/11/21 0033 06/11/21 0524 06/11/21 0906  BP: 127/74 123/80 (!) 155/81 (!) 152/85  Pulse: (!) 121 (!) 129 (!) 118 (!) 112  Resp: 18 18 17 17   Temp: 99.6 F (37.6 C) (!) 101.2 F (38.4 C) 99.9 F (37.7 C) 98.3 F (36.8 C)  TempSrc: Oral Oral Oral   SpO2: 99% 100% 99% 99%  Weight:      Height:       Physical Exam General: Lying in bed, no distress Heart: Tachycardic, 1+ pitting edema in the bilateral lower extremities Lungs: Bilateral chest rise with no increased work of breathing Abdomen: soft, mildly distended Extremities: Warm and well perfused Neuro:  awake and alert, oriented to person and place Dialysis Access:  R IJ  temp HD catheter -  placed 1/23  Additional Objective Labs: Basic Metabolic Panel: Recent Labs  Lab 06/09/21 0359 06/10/21 0405 06/11/21 0145  NA 128* 129* 132*  K 4.9 4.9 4.1  CL 93* 93* 95*  CO2 20* 22 26  GLUCOSE 123* 106* 118*  BUN 46* 61* 37*  CREATININE 7.08* 8.82* 6.01*  CALCIUM 7.6* 7.9* 7.8*  PHOS 7.7* 8.3* 5.9*   Liver Function Tests: Recent Labs  Lab 06/08/21 1022 06/09/21 0359 06/10/21 0405 06/11/21 0145  AST 25  --   --   --   ALT 16  --   --   --   ALKPHOS 148*  --   --   --   BILITOT 0.2*  --   --   --   PROT 5.3*  --   --   --   ALBUMIN 1.8*   1.6* 1.8* 1.6* 1.5*   No results for input(s): LIPASE, AMYLASE in the last 168 hours. CBC: Recent Labs  Lab 06/06/21 0333 06/07/21 0222 06/08/21 1022 06/09/21 0359 06/09/21 1950 06/10/21 0405  WBC 24.6* 24.1* 21.8* 19.5*  --  17.3*  NEUTROABS  --   --  18.5* 15.9*  --  14.0*  HGB 7.4* 7.5* 7.3* 6.8* 7.7* 7.4*  HCT 21.7* 22.1* 22.0* 20.4* 23.6* 22.6*  MCV 82.5 84.4 85.3 84.6  --  85.3  PLT 678* 762* 784* 700*  --  667*   Blood Culture    Component  Value Date/Time   SDES BLOOD RIGHT HAND 06/07/2021 1017   SPECREQUEST  06/07/2021 1017    BOTTLES DRAWN AEROBIC ONLY Blood Culture adequate volume   CULT  06/07/2021 1017    NO GROWTH 3 DAYS Performed at [facility], [address], [place], [place]    REPTSTATUS PENDING 06/07/2021 1017    Cardiac Enzymes: Recent Labs  Lab 06/05/21 0442 06/06/21 0333 06/11/21 0145  CKTOTAL 462* 432* 131   CBG: No results for input(s): GLUCAP in the last 168 hours.  Iron Studies:  No results for input(s): IRON, TIBC, TRANSFERRIN, FERRITIN in the last 72 hours.   @lablastinr3 @ Studies/Results: MR LUMBAR SPINE WO CONTRAST  Result Date: 06/09/2021 CLINICAL DATA:  Left leg complete paralysis EXAM: MRI LUMBAR SPINE WITHOUT CONTRAST TECHNIQUE: Multiplanar, multisequence MR imaging of the lumbar spine was performed. No intravenous contrast was administered. COMPARISON:  05/31/2021 FINDINGS: Segmentation:  Standard. Alignment:  Physiologic. Vertebrae: No acute fracture or suspicious osseous lesion. No evidence of discitis. Conus medullaris  and cauda equina: Conus extends to the L1-L2 level. Conus and cauda equina appear normal. Paraspinal and other soft tissues: Redemonstrated enlargement of the right iliopsoas muscle with a complex fluid collection, which is incompletely imaged but measures up to 3.3 x 2.6 cm in the axial plane, previously 3.4 x 2.6 cm when remeasured similarly. This fluid collection is now T1 isointense with a T1 hyperintense rim and T2 hyperintense with a T2 hypointense rim, previously T1 and T2 hypointense. Multiple smaller collections are also noted, the most prominent of which measures up to 0.7 cm (series 8, image 37), likely unchanged in size, with signal characteristics similar to the larger collection. Disc levels: T12-L1: No significant disc bulge. No spinal canal stenosis or neural foraminal narrowing. L1-L2: No significant disc bulge. No spinal canal stenosis or  neural foraminal narrowing. L2-L3: No significant disc bulge. No spinal canal stenosis or neural foraminal narrowing. L3-L4: Small central disc protrusion. Mild facet arthropathy. Narrowing of the lateral recesses. No spinal canal stenosis or neural foraminal narrowing. L4-L5: Mild disc bulge. Mild facet arthropathy. No spinal canal stenosis or neural foraminal narrowing. L5-S1: Mild disc bulge with central annular fissure. No spinal canal stenosis or neural foraminal narrowing. IMPRESSION: 1. Redemonstrated collections in the right iliopsoas muscle, the largest of which measures up to 3.3 cm, unchanged from the prior exam when remeasured similarly. These may represent evolving hematomas, given change in signal characteristics. 2. No spinal canal stenosis or significant neural foraminal narrowing. Electronically Signed   By: Wiliam Ke M.D.   On: 06/09/2021 20:11   DG Chest Port 1 View  Result Date: 06/10/2021 CLINICAL DATA:  Fever, cardiac arrest at home. EXAM: PORTABLE CHEST 1 VIEW COMPARISON:  06/03/2021. FINDINGS: The heart size and mediastinal contours are within normal limits. Lung volumes are low. Strandy opacities are noted in the perihilar region on the right and left lung base, similar in appearance to the prior exam. No effusion or pneumothorax. A right internal jugular central venous catheter terminates over the superior vena cava. No acute osseous abnormality. IMPRESSION: Strandy opacities in the lungs bilaterally, possible subsegmental atelectasis in not significantly changed from the prior exam. Electronically Signed   By: Thornell Sartorius M.D.   On: 06/10/2021 20:05   Medications:  sodium chloride Stopped (05/29/21 1602)   [START ON 06/12/2021] DAPTOmycin (CUBICIN)  IV      (feeding supplement) PROSource Plus  30 mL Oral BID BM   calcium acetate  1,334 mg Oral TID WC   Chlorhexidine Gluconate Cloth  6 each Topical Q0600   darbepoetin (ARANESP) injection - NON-DIALYSIS  100 mcg Subcutaneous Q  Mon-1800   feeding supplement (NEPRO CARB STEADY)  237 mL Oral QHS   folic acid  1 mg Per Tube Daily   gabapentin  100 mg Oral BID   Gerhardt's butt cream   Topical TID   heparin injection (subcutaneous)  5,000 Units Subcutaneous Q8H   multivitamin  1 tablet Oral QHS   pantoprazole  40 mg Oral BID    Assessment/ Plan: AKI severe: Baseline creatinine possibly around 2.7.  In setting of cardiac arrest, rhabdo.  Oliguric to anuric.  Required CRRT 1/15 - 1/19.   intermittent hemodialysis now on MWF schedule.  Definitive baseline for his creatinine is unknown but likely had some CKD prior to all of this.  We started discussions regarding TDC and permanent access creation but the patient was having fevers so TDC was held off and the patient is hesitant to undergo AVF  Over the last couple days the patient's urine output is picked up.  We will hold off on dialysis tomorrow and see if the patient's intradialytic creatinine improves. Unable to CLIP as AKI due to undocumented status. Could consider deeming ESRD in the next week or so if he remains HD dependent.  SP cardiac arrest - asystolic arrest in setting of prob asp PNA/ ARDS in setting of EtOH Severe metabolic/ resp acidosis -   now resolved LE neurologic deficits - ortho and neuro as well as NSG seeing-  thought is fluid in psoas muscle could be causing some compression-    Possible ischemic bowel - surgery consulting-  ex lap this past week did not show ischemic bowel needing resection but with some edema in bowel Psoas abscess: ID following. IR for draining Anemia- s/p transfusion on 1/31. No iron for now. Continue ESA. Bones-  phos has improved with binders   Darnell LevelSamuel J Crit Obremski  06/11/2021, 9:38 AM  [facility]

## 2021-06-11 NOTE — Progress Notes (Signed)
Request received to look again at right psoas collection seen on MR. Imaging reviewed with and by Dr. Bryn Gulling. Still feel this is likely hematoma and on the CT from the 23rd, there really wasn't a safe window to get to it. Pelvic bones posteriorly and bowel anteriorly prevent safe access. Recommend new CT pelvis with IV contrast to reassess if there is now a safe percutaneous window. Will follow up after CT completed.  Brayton El PA-C Interventional Radiology 06/11/2021 4:18 PM

## 2021-06-11 NOTE — Progress Notes (Signed)
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  He feels much better today all over, he feels he has more sensation in his left leg and a little more movement of his left leg from the hip.    Day 18 total antibiotics  Physical Exam: Constitutional:  Vitals:   06/11/21 0524 06/11/21 0906  BP: (!) 155/81 (!) 152/85  Pulse: (!) 118 (!) 112  Resp: 17 17  Temp: 99.9 F (37.7 C) 98.3 F (36.8 C)  SpO2: 99% 99%   patient appears in NAD Respiratory: Normal respiratory effort; CTA B Cardiovascular: RRR MS: no movement of his left toes, foot; improved movement of his left hip  Review of Systems: Constitutional: positive for fevers and chills Respiratory: negative for cough or sputum Gastrointestinal: negative for nausea and diarrhea  Lab Results  Component Value Date   WBC 17.3 (H) 06/10/2021   HGB 7.4 (L) 06/10/2021   HCT 22.6 (L) 06/10/2021   MCV 85.3 06/10/2021   PLT 667 (H) 06/10/2021    Lab Results  Component Value Date   CREATININE 6.01 (H) 06/11/2021   BUN 37 (H) 06/11/2021   NA 132 (L) 06/11/2021   K 4.1 06/11/2021   CL 95 (L) 06/11/2021   CO2 26 06/11/2021    Lab Results  Component Value Date   ALT 16 06/08/2021   AST 25 06/08/2021   ALKPHOS 148 (H) 06/08/2021     Microbiology: Recent Results (from the past 240 hour(s))  Culture, blood (routine x 2)     Status: None (Preliminary result)   Collection Time: 06/07/21 10:14 AM   Specimen: BLOOD  Result Value Ref Range Status   Specimen Description BLOOD RIGHT ANTECUBITAL  Final   Special Requests   Final    BOTTLES DRAWN AEROBIC ONLY Blood Culture adequate volume   Culture   Final    NO GROWTH 4 DAYS Performed at [facility], [address], [place], [place] [address]    Report Status PENDING  Incomplete  Culture, blood (routine x 2)     Status: None (Preliminary  result)   Collection Time: 06/07/21 10:17 AM   Specimen: BLOOD RIGHT HAND  Result Value Ref Range Status   Specimen Description BLOOD RIGHT HAND  Final   Special Requests   Final    BOTTLES DRAWN AEROBIC ONLY Blood Culture adequate volume   Culture   Final    NO GROWTH 4 DAYS Performed at [facility], [address], [place], [place] [address]    Report Status PENDING  Incomplete  Gastrointestinal Panel by PCR , Stool     Status: None   Collection Time: 06/07/21  8:38 PM   Specimen: STOOL  Result Value Ref Range Status   Campylobacter species NOT DETECTED NOT DETECTED Final   Plesimonas shigelloides NOT DETECTED NOT DETECTED Final   Salmonella species NOT DETECTED NOT DETECTED Final   Yersinia enterocolitica NOT DETECTED NOT DETECTED Final   Vibrio species NOT DETECTED NOT DETECTED Final   Vibrio cholerae NOT DETECTED NOT DETECTED Final   Enteroaggregative E coli (EAEC) NOT DETECTED NOT DETECTED Final   Enteropathogenic E coli (EPEC) NOT DETECTED NOT DETECTED Final   Enterotoxigenic E coli (ETEC) NOT DETECTED NOT DETECTED Final   Shiga like toxin producing E coli (STEC) NOT DETECTED NOT DETECTED Final   Shigella/Enteroinvasive E coli (EIEC)  NOT DETECTED NOT DETECTED Final   Cryptosporidium NOT DETECTED NOT DETECTED Final   Cyclospora cayetanensis NOT DETECTED NOT DETECTED Final   Entamoeba histolytica NOT DETECTED NOT DETECTED Final   Giardia lamblia NOT DETECTED NOT DETECTED Final   Adenovirus F40/41 NOT DETECTED NOT DETECTED Final   Astrovirus NOT DETECTED NOT DETECTED Final   Norovirus GI/GII NOT DETECTED NOT DETECTED Final   Rotavirus A NOT DETECTED NOT DETECTED Final   Sapovirus (I, II, IV, and V) NOT DETECTED NOT DETECTED Final    Comment: Performed at [facility], [address], [place], [place] [address]  C Difficile Quick Screen (NO PCR Reflex)     Status: None   Collection Time: 06/07/21  8:38 PM   Specimen: STOOL  Result Value Ref Range Status    C Diff antigen NEGATIVE NEGATIVE Final   C Diff toxin NEGATIVE NEGATIVE Final   C Diff interpretation No C. difficile detected.  Final    Comment: Performed at [facility], [place] [address] [place], [place] [address]  Culture, blood (routine x 2)     Status: None (Preliminary result)   Collection Time: 06/10/21  9:02 PM   Specimen: BLOOD  Result Value Ref Range Status   Specimen Description BLOOD LEFT ANTECUBITAL  Final   Special Requests   Final    BOTTLES DRAWN AEROBIC AND ANAEROBIC Blood Culture adequate volume   Culture   Final    NO GROWTH < 12 HOURS Performed at [facility], [place] [address], [place], [place] [address]    Report Status PENDING  Incomplete  Culture, blood (routine x 2)     Status: None (Preliminary result)   Collection Time: 06/10/21  9:02 PM   Specimen: BLOOD  Result Value Ref Range Status   Specimen Description BLOOD RIGHT ANTECUBITAL  Final   Special Requests   Final    BOTTLES DRAWN AEROBIC AND ANAEROBIC Blood Culture results may not be optimal due to an inadequate volume of blood received in culture bottles   Culture   Final    NO GROWTH < 12 HOURS Performed at [facility], [place] [address] [place], [place] [address]    Report Status PENDING  Incomplete    Impression/Plan:  1. Myositis/psoas abscess - no previous positive culture and now on vancomycin after switching yesterday with fever and ongoing lower extremity symptoms.  Hopefully will continue to have some improvement.   2.  Leukocytosis - this is trendign down and will continue to monitor.   3.  Left leg weakness - some movement in his left upper leg today, some improvement and will continue to monitor.

## 2021-06-11 NOTE — Progress Notes (Signed)
PROGRESS NOTE    Andres Braun  XMI:[id number] DOB: Feb 27, 1976 DOA: 05/24/2021 PCP: Merryl Hacker, No    Brief Narrative:  46 year old male admitted to ICU 1/15 under PCCM care after cardiac arrest at home.  PEA arrest, s/p CPR x20 minutes and epi x5.  Patient reportedly out drinking with his friends the night prior to admission.  Found by friends with agonal breathing.  Intubated on arrival, needed vasopressors, noted to have renal failure, rhabdomyolysis with CK in the 14 K range, treated for aspiration pneumonia, nephrology consulted and started on CRRT.  Extubated 1/19 and off pressors.  Mental status slowly improved.  Transferred to telemetry.  Care transferred to [facility] on 1/24.  Nephrology following for HD needs.  General surgery on board for questionable small bowel ischemia and taken emergently to surgery 1/24. The bowel in question was found to be thickened, but viable. Hospital course complicated by unexplained left lower extremity weakness. Extensive work up of the neuraxis is negative for compressive pathology. Differential is probably a psoas hematoma causing lumbar plexus compression/ irritation leading to secondary weakness. IR suggested that it is not amenable for aspiration or drain placement. Neurology consulted, suggested not a neurological injury as the neuraxis is negative.   2/2 feels little better this am. No new complaints. No overnight issues. Tmax  101.7.   Consultants:  Nephrology Surgery.  Orthopedics Dr Dr Sammuel Hines. ID  PCCM NeurologyD, nephrology  Procedures:  Exploratory laparotomy Mechanical ventilation CRRT Hemodialysis Antimicrobials:  Vancomycin Cefazolin   Subjective: Still with LE b/l pain, feels better. No other complaints  Objective: Vitals:   06/10/21 2046 06/11/21 0033 06/11/21 0524 06/11/21 0906  BP: 127/74 123/80 (!) 155/81 (!) 152/85  Pulse: (!) 121 (!) 129 (!) 118 (!) 112  Resp: 18 18 17 17   Temp: 99.6 F (37.6 C) (!) 101.2 F (38.4 C) 99.9  F (37.7 C) 98.3 F (36.8 C)  TempSrc: Oral Oral Oral   SpO2: 99% 100% 99% 99%  Weight:      Height:        Intake/Output Summary (Last 24 hours) at 06/11/2021 1457 Last data filed at 06/11/2021 1300 Gross per 24 hour  Intake 1183.26 ml  Output 2350 ml  Net -1166.74 ml   Filed Weights   06/10/21 0500 06/10/21 0739 06/10/21 1124  Weight: 86.9 kg 87 kg 85.5 kg    Examination: Calm, NAD CTA no wheezing Regular S1-S2 no gallops Soft benign positive bowel sounds No edema Awake and alert Mood and affect appropriate in current setting   Data Reviewed: I have personally reviewed following labs and imaging studies  CBC: Recent Labs  Lab 06/07/21 0222 06/08/21 1022 06/09/21 0359 06/09/21 1950 06/10/21 0405 06/11/21 0145  WBC 24.1* 21.8* 19.5*  --  17.3* 13.1*  NEUTROABS  --  18.5* 15.9*  --  14.0*  --   HGB 7.5* 7.3* 6.8* 7.7* 7.4* 7.4*  HCT 22.1* 22.0* 20.4* 23.6* 22.6* 23.5*  MCV 84.4 85.3 84.6  --  85.3 88.3  PLT 762* 784* 700*  --  667* 825*   Basic Metabolic Panel: Recent Labs  Lab 06/07/21 0222 06/08/21 1022 06/09/21 0359 06/10/21 0405 06/11/21 0145  NA 129* 131* 128* 129* 132*  K 4.4 4.2 4.9 4.9 4.1  CL 93* 100 93* 93* 95*  CO2 24 20* 20* 22 26  GLUCOSE 107* 109* 123* 106* 118*  BUN 53* 65* 46* 61* 37*  CREATININE 6.60* 8.30* 7.08* 8.82* 6.01*  CALCIUM 7.6* 6.8* 7.6* 7.9* 7.8*  PHOS 7.9* 9.2* 7.7* 8.3* 5.9*   GFR: Estimated Creatinine Clearance: 17 mL/min (A) (by C-G formula based on SCr of 6.01 mg/dL (H)). Liver Function Tests: Recent Labs  Lab 06/07/21 0222 06/08/21 1022 06/09/21 0359 06/10/21 0405 06/11/21 0145  AST  --  25  --   --   --   ALT  --  16  --   --   --   ALKPHOS  --  148*  --   --   --   BILITOT  --  0.2*  --   --   --   PROT  --  5.3*  --   --   --   ALBUMIN 1.8* 1.8*   1.6* 1.8* 1.6* 1.5*   No results for input(s): LIPASE, AMYLASE in the last 168 hours. No results for input(s): AMMONIA in the last 168 hours. Coagulation  Profile: No results for input(s): INR, PROTIME in the last 168 hours. Cardiac Enzymes: Recent Labs  Lab 06/05/21 0442 06/06/21 0333 06/11/21 0145  CKTOTAL 462* 432* 131   BNP (last 3 results) No results for input(s): PROBNP in the last 8760 hours. HbA1C: No results for input(s): HGBA1C in the last 72 hours. CBG: No results for input(s): GLUCAP in the last 168 hours. Lipid Profile: No results for input(s): CHOL, HDL, LDLCALC, TRIG, CHOLHDL, LDLDIRECT in the last 72 hours. Thyroid Function Tests: No results for input(s): TSH, T4TOTAL, FREET4, T3FREE, THYROIDAB in the last 72 hours. Anemia Panel: No results for input(s): VITAMINB12, FOLATE, FERRITIN, TIBC, IRON, RETICCTPCT in the last 72 hours. Sepsis Labs: Recent Labs  Lab 06/06/21 2008 06/07/21 0222 06/08/21 1022  PROCALCITON 0.86 0.93 0.80    Recent Results (from the past 240 hour(s))  Culture, blood (routine x 2)     Status: None (Preliminary result)   Collection Time: 06/07/21 10:14 AM   Specimen: BLOOD  Result Value Ref Range Status   Specimen Description BLOOD RIGHT ANTECUBITAL  Final   Special Requests   Final    BOTTLES DRAWN AEROBIC ONLY Blood Culture adequate volume   Culture   Final    NO GROWTH 4 DAYS Performed at [facility], [place] [address], [place], [place] [address]    Report Status PENDING  Incomplete  Culture, blood (routine x 2)     Status: None (Preliminary result)   Collection Time: 06/07/21 10:17 AM   Specimen: BLOOD RIGHT HAND  Result Value Ref Range Status   Specimen Description BLOOD RIGHT HAND  Final   Special Requests   Final    BOTTLES DRAWN AEROBIC ONLY Blood Culture adequate volume   Culture   Final    NO GROWTH 4 DAYS Performed at [facility], [place] [address] [place], [place] [address]    Report Status PENDING  Incomplete  Gastrointestinal Panel by PCR , Stool     Status: None   Collection Time: 06/07/21  8:38 PM   Specimen: STOOL  Result Value Ref Range Status    Campylobacter species NOT DETECTED NOT DETECTED Final   Plesimonas shigelloides NOT DETECTED NOT DETECTED Final   Salmonella species NOT DETECTED NOT DETECTED Final   Yersinia enterocolitica NOT DETECTED NOT DETECTED Final   Vibrio species NOT DETECTED NOT DETECTED Final   Vibrio cholerae NOT DETECTED NOT DETECTED Final   Enteroaggregative E coli (EAEC) NOT DETECTED NOT DETECTED Final   Enteropathogenic E coli (EPEC) NOT DETECTED NOT DETECTED Final   Enterotoxigenic E coli (ETEC) NOT DETECTED NOT  DETECTED Final   Shiga like toxin producing E coli (STEC) NOT DETECTED NOT DETECTED Final   Shigella/Enteroinvasive E coli (EIEC) NOT DETECTED NOT DETECTED Final   Cryptosporidium NOT DETECTED NOT DETECTED Final   Cyclospora cayetanensis NOT DETECTED NOT DETECTED Final   Entamoeba histolytica NOT DETECTED NOT DETECTED Final   Giardia lamblia NOT DETECTED NOT DETECTED Final   Adenovirus F40/41 NOT DETECTED NOT DETECTED Final   Astrovirus NOT DETECTED NOT DETECTED Final   Norovirus GI/GII NOT DETECTED NOT DETECTED Final   Rotavirus A NOT DETECTED NOT DETECTED Final   Sapovirus (I, II, IV, and V) NOT DETECTED NOT DETECTED Final    Comment: Performed at [facility], [address] [place], [place] [address]  C Difficile Quick Screen (NO PCR Reflex)     Status: None   Collection Time: 06/07/21  8:38 PM   Specimen: STOOL  Result Value Ref Range Status   C Diff antigen NEGATIVE NEGATIVE Final   C Diff toxin NEGATIVE NEGATIVE Final   C Diff interpretation No C. difficile detected.  Final    Comment: Performed at [facility], [place] [address], [place], [place] [address]  Culture, blood (routine x 2)     Status: None (Preliminary result)   Collection Time: 06/10/21  9:02 PM   Specimen: BLOOD  Result Value Ref Range Status   Specimen Description BLOOD LEFT ANTECUBITAL  Final   Special Requests   Final    BOTTLES DRAWN AEROBIC AND ANAEROBIC Blood Culture adequate volume   Culture    Final    NO GROWTH < 12 HOURS Performed at [facility], [place] [address], [place], [place] [address]    Report Status PENDING  Incomplete  Culture, blood (routine x 2)     Status: None (Preliminary result)   Collection Time: 06/10/21  9:02 PM   Specimen: BLOOD  Result Value Ref Range Status   Specimen Description BLOOD RIGHT ANTECUBITAL  Final   Special Requests   Final    BOTTLES DRAWN AEROBIC AND ANAEROBIC Blood Culture results may not be optimal due to an inadequate volume of blood received in culture bottles   Culture   Final    NO GROWTH < 12 HOURS Performed at [facility], [place] [address] [place], [place] [address]    Report Status PENDING  Incomplete         Radiology Studies: MR LUMBAR SPINE WO CONTRAST  Result Date: 06/09/2021 CLINICAL DATA:  Left leg complete paralysis EXAM: MRI LUMBAR SPINE WITHOUT CONTRAST TECHNIQUE: Multiplanar, multisequence MR imaging of the lumbar spine was performed. No intravenous contrast was administered. COMPARISON:  05/31/2021 FINDINGS: Segmentation:  Standard. Alignment:  Physiologic. Vertebrae: No acute fracture or suspicious osseous lesion. No evidence of discitis. Conus medullaris and cauda equina: Conus extends to the L1-L2 level. Conus and cauda equina appear normal. Paraspinal and other soft tissues: Redemonstrated enlargement of the right iliopsoas muscle with a complex fluid collection, which is incompletely imaged but measures up to 3.3 x 2.6 cm in the axial plane, previously 3.4 x 2.6 cm when remeasured similarly. This fluid collection is now T1 isointense with a T1 hyperintense rim and T2 hyperintense with a T2 hypointense rim, previously T1 and T2 hypointense. Multiple smaller collections are also noted, the most prominent of which measures up to 0.7 cm (series 8, image 37), likely unchanged in size, with signal characteristics similar to the larger collection. Disc levels: T12-L1: No significant disc bulge. No  spinal canal  stenosis or neural foraminal narrowing. L1-L2: No significant disc bulge. No spinal canal stenosis or neural foraminal narrowing. L2-L3: No significant disc bulge. No spinal canal stenosis or neural foraminal narrowing. L3-L4: Small central disc protrusion. Mild facet arthropathy. Narrowing of the lateral recesses. No spinal canal stenosis or neural foraminal narrowing. L4-L5: Mild disc bulge. Mild facet arthropathy. No spinal canal stenosis or neural foraminal narrowing. L5-S1: Mild disc bulge with central annular fissure. No spinal canal stenosis or neural foraminal narrowing. IMPRESSION: 1. Redemonstrated collections in the right iliopsoas muscle, the largest of which measures up to 3.3 cm, unchanged from the prior exam when remeasured similarly. These may represent evolving hematomas, given change in signal characteristics. 2. No spinal canal stenosis or significant neural foraminal narrowing. Electronically Signed   By: Merilyn Baba M.D.   On: 06/09/2021 20:11   DG Chest Port 1 View  Result Date: 06/10/2021 CLINICAL DATA:  Fever, cardiac arrest at home. EXAM: PORTABLE CHEST 1 VIEW COMPARISON:  06/03/2021. FINDINGS: The heart size and mediastinal contours are within normal limits. Lung volumes are low. Strandy opacities are noted in the perihilar region on the right and left lung base, similar in appearance to the prior exam. No effusion or pneumothorax. A right internal jugular central venous catheter terminates over the superior vena cava. No acute osseous abnormality. IMPRESSION: Strandy opacities in the lungs bilaterally, possible subsegmental atelectasis in not significantly changed from the prior exam. Electronically Signed   By: Brett Fairy M.D.   On: 06/10/2021 20:05        Scheduled Meds:  (feeding supplement) PROSource Plus  30 mL Oral BID BM   calcium acetate  1,334 mg Oral TID WC   Chlorhexidine Gluconate Cloth  6 each Topical Q0600   darbepoetin (ARANESP) injection - NON-DIALYSIS   100 mcg Subcutaneous Q Mon-1800   feeding supplement (NEPRO CARB STEADY)  237 mL Oral QHS   folic acid  1 mg Per Tube Daily   gabapentin  100 mg Oral BID   Gerhardt's butt cream   Topical TID   heparin injection (subcutaneous)  5,000 Units Subcutaneous Q8H   multivitamin  1 tablet Oral QHS   pantoprazole  40 mg Oral BID   Continuous Infusions:  sodium chloride Stopped (05/29/21 1602)   [START ON 06/12/2021] DAPTOmycin (CUBICIN)  IV      Assessment & Plan:   Principal Problem:   Cardiac arrest with pulseless electrical activity (HCC) Active Problems:   Acute respiratory failure with hypoxia and hypercapnia (HCC)   Septic shock due to undetermined organism ([place])   AKI (acute kidney injury) ([place])   Acute metabolic encephalopathy   Aspiration pneumonia of both lower lobes due to vomit ([place])   Protein-calorie malnutrition, severe   Acute hypoxic respiratory failure/aspiration pneumonia Patient intubated on 05/24/2021 and extubated on 05/28/2021 . 2/2 patient completed course of antibiotics for aspiration pneumonia  Currently on room air         Ischemic enteritis versus colitis with ileus General surgery consulted, there was concerned about an ischemic bowel.  Patient was taken to the OR for diagnostic laparoscopy, surgery revealed bowel in question is thickened,  no ischemia was seen.  hence no further intervention at this time.  Advance diet as tolerated without any issues at this time. Diarrhea improved with imodium.  2/1 C. difficile PCR and GI panel negative  Continue to monitor     Fever Myositis/psoas abscess 2/2Overnight night had fever, now trending down Bcx obtained  Cxr possible atelectasis Continue vancomycin ID following, spoke to dr. Linus Salmons, will consult IR for drainage since no improvement.   Lower extremity weakness with left foot drop:  -MRI of the neuraxis  was unremarkable for acute process .  As per the MRI of the lumbar spine, multiple fluid  collections in the right ileo psoas muscle.  Dr Radene Gunning spoke with neurology and neuro surgery on 1/24, no surgical cause for his lower extremity weakness. He suggested that a psoas abscess or hematoma could cause lumbar plexus compression /irritation leading to secondary weakness.   IR consulted for aspiration, suggested any aspiration would predispose to infection/ not amenable for aspiration or drain placement. Did not advise aspiration.  MRI of the femur shows Diffuse muscular abnormalities throughout the visualized portions of both thighs with heterogeneous T2 signal and enhancement, especially within the left gluteus maximus and quadratus femoris muscles. Findings are compatible with the diagnosis of rhabdomyolysis, with the differential including myositis related to connective tissue disease and polymyositis. No drainable abscess identified. pt unable to move the left lower extremity from severe pain and weakness.  Orthopedics consulted, suggested its a neurological/ lumbar plexus ischemic injury.  Hoping for recovery with therapy evaluations.  CK levels are improving. Vitamin b12 and folate levels wnl. ESR is 85, CRP is 5.4   Pt has good pulse in DP, no sensory deficits. No signs of compartment syndrome.  2/2 continue gabapentin CIR pending        Acute metabolic encephalopathy: Resolved, MS at baseline       Right anterior knee wound:  -wound care consulted.      Acute kidney injury with metabolic acidosis/rhabdomyolysis Patient has been off CRRT 1/15-1/19/2023. Nephrology on board and appreciate recommendations.  Hoping for renal recovery, if no recovery will need a perm cath next week.   Improving CK levels. 2/2 intermittent HD now on MWF schedule. Hold off on permanent access creation since febrile     Hyponatremia -Secondary to AKI     Severe protein calorie malnutrition. Supplementation added. Albumin around .1.8   Mechanical fall 1/24 and hit his head -As per  nursing report, prior to going to the OR, patient attempted to get out of the bed, fell and hit his head without any obvious external injuries or LOC.  Patient's friend was in the room. Advised nursing regarding strict fall precautions, placement of safety sitter if needed and monitor closely.     Anemia of acute illness/  acute anemia of blood loss from hematoma in the psoas muscle  Hemoglobin on admission is around 15.9 to  12.6 to 11.8 to 10.5 to 9.9 to 8.7 to 8.2 to 7.5 to 7.3 to 6.8 today.  Transfuse  to keep hemoglobin greater than 7. 1 unit of prbc transfusion ordered, recheck H&H tonight.  Iron deficiency on anemia panel. Will need supplementation on discharge.  Stool for occult blood is positive on 05/30/21. Suspect from colitis.          Elevated liver enzymes:  Probably from ETOH abuse, shock liver and rhabdomyolysis.  Much improved.      Fever today, with improving Leukocytosis:  Reactive or from muscle necrosis. Blood cultures are negative so far.  Procalcitonin is 0.8. on cefazolin for muscle necrosis. C diff pcr is negative and GI panel is negative  Unable to get UA as pt is incontinent.  ID on board and following. Ordered repeat MRI of the lumbar spine.      Thrombocytosis:  Reactive or from  infection   DVT prophylaxis: Heparin Code Status: Full Family Communication: None at bedside Disposition Plan:  Status is: Inpatient Remains inpatient appropriate because:IV treatment. Had fever, w/u pending. Needs IR for abscess drainage.                LOS: 18 days   Time spent: 35 minutes with more than 50% on COC    Nolberto Hanlon, MD [facility] Pager [phone]  If 7PM-7AM, please contact night-coverage 06/11/2021, 2:57 PM

## 2021-06-12 ENCOUNTER — Other Ambulatory Visit (HOSPITAL_COMMUNITY): Payer: Self-pay

## 2021-06-12 DIAGNOSIS — E871 Hypo-osmolality and hyponatremia: Secondary | ICD-10-CM | POA: Insufficient documentation

## 2021-06-12 DIAGNOSIS — K529 Noninfective gastroenteritis and colitis, unspecified: Secondary | ICD-10-CM

## 2021-06-12 DIAGNOSIS — D62 Acute posthemorrhagic anemia: Secondary | ICD-10-CM

## 2021-06-12 DIAGNOSIS — R748 Abnormal levels of other serum enzymes: Secondary | ICD-10-CM

## 2021-06-12 DIAGNOSIS — L0390 Cellulitis, unspecified: Secondary | ICD-10-CM

## 2021-06-12 DIAGNOSIS — I739 Peripheral vascular disease, unspecified: Secondary | ICD-10-CM | POA: Insufficient documentation

## 2021-06-12 DIAGNOSIS — M21379 Foot drop, unspecified foot: Secondary | ICD-10-CM

## 2021-06-12 DIAGNOSIS — K6812 Psoas muscle abscess: Secondary | ICD-10-CM

## 2021-06-12 DIAGNOSIS — W19XXXA Unspecified fall, initial encounter: Secondary | ICD-10-CM

## 2021-06-12 DIAGNOSIS — D75839 Thrombocytosis, unspecified: Secondary | ICD-10-CM | POA: Insufficient documentation

## 2021-06-12 LAB — CULTURE, BLOOD (ROUTINE X 2)
Culture: NO GROWTH
Culture: NO GROWTH
Special Requests: ADEQUATE
Special Requests: ADEQUATE

## 2021-06-12 LAB — RENAL FUNCTION PANEL
Albumin: 1.6 g/dL — ABNORMAL LOW (ref 3.5–5.0)
Anion gap: 11 (ref 5–15)
BUN: 55 mg/dL — ABNORMAL HIGH (ref 6–20)
CO2: 23 mmol/L (ref 22–32)
Calcium: 8 mg/dL — ABNORMAL LOW (ref 8.9–10.3)
Chloride: 97 mmol/L — ABNORMAL LOW (ref 98–111)
Creatinine, Ser: 7.86 mg/dL — ABNORMAL HIGH (ref 0.61–1.24)
GFR, Estimated: 8 mL/min — ABNORMAL LOW (ref >60)
Glucose, Bld: 105 mg/dL — ABNORMAL HIGH (ref 70–99)
Phosphorus: 7 mg/dL — ABNORMAL HIGH (ref 2.5–4.6)
Potassium: 4.7 mmol/L (ref 3.5–5.1)
Sodium: 131 mmol/L — ABNORMAL LOW (ref 135–145)

## 2021-06-12 MED ORDER — SODIUM CHLORIDE 0.9 % IV SOLN
8.0000 mg/kg | Freq: Once | INTRAVENOUS | Status: AC
  Administered 2021-06-12: 700 mg via INTRAVENOUS
  Filled 2021-06-12: qty 14

## 2021-06-12 MED ORDER — SODIUM CHLORIDE 0.9 % IV SOLN
8.0000 mg/kg | INTRAVENOUS | Status: DC
  Administered 2021-06-14 – 2021-06-16 (×2): 700 mg via INTRAVENOUS
  Filled 2021-06-12 (×4): qty 14

## 2021-06-12 NOTE — Progress Notes (Signed)
°   06/12/21 0906  °Assess: MEWS Score  °Temp 98.4 °F (36.9 °C)  °BP (!) 144/88  °Pulse Rate (!) 121  °Resp 17  °SpO2 94 %  °O2 Device Room Air  °Assess: MEWS Score  °MEWS Temp 0  °MEWS Systolic 0  °MEWS Pulse 2  °MEWS RR 0  °MEWS LOC 0  °MEWS Score 2  °MEWS Score Color Yellow  °Assess: if the MEWS score is Yellow or Red  °Were vital signs taken at a resting state? Yes  °Focused Assessment No change from prior assessment  °Early Detection of Sepsis Score *See Row Information* Medium  °MEWS guidelines implemented *See Row Information* No, previously yellow, continue vital signs every 4 hours  °Treat  °MEWS Interventions Administered scheduled meds/treatments;Other (Comment) °(MD awar.)  °Pain Scale 0-10  °Pain Score 4  °Faces Pain Scale 2  °Pain Type Acute pain  °Pain Location Leg  °Pain Orientation Right;Left  °Pain Descriptors / Indicators Heaviness  °Pain Frequency Intermittent  °Pain Onset On-going  °Patients Stated Pain Goal 0  °Pain Intervention(s) Repositioned  °Multiple Pain Sites No  °Take Vital Signs  °Increase Vital Sign Frequency  Yellow: Q 2hr X 2 then Q 4hr X 2, if remains yellow, continue Q 4hrs  °Escalate  °MEWS: Escalate Yellow: discuss with charge nurse/RN and consider discussing with provider and RRT  °Notify: Charge Nurse/RN  °Name of Charge Nurse/RN Notified Candice R.N.  °Date Charge Nurse/RN Notified 06/12/21  °Time Charge Nurse/RN Notified 0900  °Notify: Provider  °Provider Name/Title [email or web address]  °Date Provider Notified 06/12/21  °Time Provider Notified 0920  °Notification Type Rounds  °Notification Reason Other (Comment)  °Provider response In department  °Date of Provider Response 06/12/21  °Time of Provider Response 0920  ° ° °

## 2021-06-12 NOTE — Progress Notes (Signed)
Pharmacy Antibiotic Note  Andres Braun is a 46 y.o. male admitted on 05/24/2021 and how with concern for myositis/psoas abscess. Pharmacy has been consulted for Daptomycin dosing.   The patient received Vancomycin 1g on 2/1 @ 1646 . Last HD on 2/1 AM, UOP  has improved with 2.1L charted in the past 24h. Assuming some Vanc cleared so will start Daptomycin earlier. Using Daptomycin to avoid nephrotoxic agents with AKI.   Plan: - Daptomycin 700 mg (~8 mg/kg) every 48h - start first dose now - Will continue to follow renal function, culture results, LOT, and antibiotic de-escalation plans    Height: 6' (182.9 cm) Weight: 85.5 kg (188 lb 7.9 oz) IBW/kg (Calculated) : 77.6  Temp (24hrs), Avg:99.6 F (37.6 C), Min:98.1 F (36.7 C), Max:101.8 F (38.8 C)  Recent Labs  Lab 06/07/21 0222 06/08/21 1022 06/09/21 0359 06/10/21 0405 06/11/21 0145 06/12/21 0450  WBC 24.1* 21.8* 19.5* 17.3* 13.1*  --   CREATININE 6.60* 8.30* 7.08* 8.82* 6.01* 7.86*    Estimated Creatinine Clearance: 13 mL/min (A) (by C-G formula based on SCr of 7.86 mg/dL (H)).    No Known Allergies  Antimicrobials this admission: CTX 1/15 >> 1/23 Flagyl 1/21 >> 1/23 Zosyn 1/24 >> 1/27 Cefazolin 1/27 >> 1/31 Vancomycin 2/1 x 1 Daptomycin 2/3 >>  Dose adjustments this admission: N/a  Microbiology results: 1/15 COVID/flu >> neg 1/15 MRSA PCR >> neg 1/23 GI panel << neg 1/29 CDiff >> neg 1/29 GI panel >> neg 1/29 BCx >> NGf 2/1 BCx >> ngx2d 2/1 UCx >> 20k PSA  Thank you for allowing pharmacy to be a part of this patients care.  Alycia Rossetti, PharmD, BCPS Infectious Diseases Clinical Pharmacist 06/12/2021 3:30 PM   **Pharmacist phone directory can now be found on [email or web address] ([email or web address]).  Listed under [place].

## 2021-06-12 NOTE — Assessment & Plan Note (Addendum)
Sepsis physiology resolved

## 2021-06-12 NOTE — Assessment & Plan Note (Signed)
Patient intubated on 05/24/2021 and extubated on 05/28/2021 .  patient completed course of antibiotics for aspiration pneumonia  2/3 currently on room air

## 2021-06-12 NOTE — Assessment & Plan Note (Addendum)
General surgery consulted, diagnostic laparoscopy unremarkable  C. difficile PCR and GI panel negative

## 2021-06-12 NOTE — Progress Notes (Signed)
[facility] Progress Note   Subjective:   Patient continues to feel well today.  Urine output very robust with 2.1 L made.  Creatinine continue to rise in between dialysis.  Objective Vitals:   06/11/21 2057 06/12/21 0110 06/12/21 0508 06/12/21 0906  BP: (!) 146/84 138/77 (!) 146/80 (!) 144/88  Pulse: (!) 126 (!) 119 (!) 125 (!) 121  Resp: 18 18 18 17   Temp: (!) 100.9 F (38.3 C) 99.1 F (37.3 C) (!) 101.8 F (38.8 C) 98.4 F (36.9 C)  TempSrc: Oral  Oral Oral  SpO2: 100% 98% 99% 94%  Weight:      Height:       Physical Exam General: Lying in bed, no distress Heart: Tachycardic, 1+ pitting edema in the bilateral lower extremities Lungs: Bilateral chest rise with no increased work of breathing Abdomen: soft, mildly distended Extremities: Warm and well perfused Neuro:  awake and alert, oriented to person and place Dialysis Access:  R IJ  temp HD catheter -  placed 1/23  Additional Objective Labs: Basic Metabolic Panel: Recent Labs  Lab 06/10/21 0405 06/11/21 0145 06/12/21 0450  NA 129* 132* 131*  K 4.9 4.1 4.7  CL 93* 95* 97*  CO2 22 26 23   GLUCOSE 106* 118* 105*  BUN 61* 37* 55*  CREATININE 8.82* 6.01* 7.86*  CALCIUM 7.9* 7.8* 8.0*  PHOS 8.3* 5.9* 7.0*   Liver Function Tests: Recent Labs  Lab 06/08/21 1022 06/09/21 0359 06/10/21 0405 06/11/21 0145 06/12/21 0450  AST 25  --   --   --   --   ALT 16  --   --   --   --   ALKPHOS 148*  --   --   --   --   BILITOT 0.2*  --   --   --   --   PROT 5.3*  --   --   --   --   ALBUMIN 1.8*   1.6*   < > 1.6* 1.5* 1.6*   < > = values in this interval not displayed.   No results for input(s): LIPASE, AMYLASE in the last 168 hours. CBC: Recent Labs  Lab 06/07/21 0222 06/08/21 1022 06/09/21 0359 06/09/21 1950 06/10/21 0405 06/11/21 0145  WBC 24.1* 21.8* 19.5*  --  17.3* 13.1*  NEUTROABS  --  18.5* 15.9*  --  14.0*  --   HGB 7.5* 7.3* 6.8* 7.7* 7.4* 7.4*  HCT 22.1* 22.0* 20.4* 23.6* 22.6*  23.5*  MCV 84.4 85.3 84.6  --  85.3 88.3  PLT 762* 784* 700*  --  667* 693*   Blood Culture    Component Value Date/Time   SDES BLOOD LEFT ANTECUBITAL 06/10/2021 2102   SDES BLOOD RIGHT ANTECUBITAL 06/10/2021 2102   SPECREQUEST  06/10/2021 2102    BOTTLES DRAWN AEROBIC AND ANAEROBIC Blood Culture adequate volume   SPECREQUEST  06/10/2021 2102    BOTTLES DRAWN AEROBIC AND ANAEROBIC Blood Culture results may not be optimal due to an inadequate volume of blood received in culture bottles   CULT  06/10/2021 2102    NO GROWTH 2 DAYS Performed at [facility], [address] [place], [place] [address]    CULT  06/10/2021 2102    NO GROWTH 2 DAYS Performed at [facility], [address] [place], [place] [address]    REPTSTATUS PENDING 06/10/2021 2102   REPTSTATUS PENDING 06/10/2021 2102    Cardiac Enzymes: Recent Labs  Lab 06/06/21  1740 06/11/21 0145  CKTOTAL 432* 131   CBG: No results for input(s): GLUCAP in the last 168 hours.  Iron Studies:  No results for input(s): IRON, TIBC, TRANSFERRIN, FERRITIN in the last 72 hours.   @lablastinr3 @ Studies/Results: DG Chest Port 1 View  Result Date: 06/10/2021 CLINICAL DATA:  Fever, cardiac arrest at home. EXAM: PORTABLE CHEST 1 VIEW COMPARISON:  06/03/2021. FINDINGS: The heart size and mediastinal contours are within normal limits. Lung volumes are low. Strandy opacities are noted in the perihilar region on the right and left lung base, similar in appearance to the prior exam. No effusion or pneumothorax. A right internal jugular central venous catheter terminates over the superior vena cava. No acute osseous abnormality. IMPRESSION: Strandy opacities in the lungs bilaterally, possible subsegmental atelectasis in not significantly changed from the prior exam. Electronically Signed   By: 06/05/2021 M.D.   On: 06/10/2021 20:05   Medications:  sodium chloride Stopped (05/29/21 1602)   DAPTOmycin (CUBICIN)  IV     [START  ON 06/14/2021] DAPTOmycin (CUBICIN)  IV      (feeding supplement) PROSource Plus  30 mL Oral BID BM   calcium acetate  1,334 mg Oral TID WC   Chlorhexidine Gluconate Cloth  6 each Topical Q0600   darbepoetin (ARANESP) injection - NON-DIALYSIS  100 mcg Subcutaneous Q Mon-1800   feeding supplement (NEPRO CARB STEADY)  237 mL Oral QHS   folic acid  1 mg Per Tube Daily   gabapentin  100 mg Oral BID   Gerhardt's butt cream   Topical TID   heparin injection (subcutaneous)  5,000 Units Subcutaneous Q8H   multivitamin  1 tablet Oral QHS   pantoprazole  40 mg Oral BID    Assessment/ Plan: AKI severe: Baseline creatinine possibly around 2.7.  In setting of cardiac arrest, rhabdo.  Oliguric to anuric.  Required CRRT 1/15 - 1/19.   intermittent hemodialysis now on MWF schedule.  Definitive baseline for his creatinine is unknown but likely had some CKD prior to all of this.  We started discussions regarding TDC and permanent access creation but the patient was having fevers so TDC was held off and the patient is hesitant to undergo AVF  Over the last couple days the patient's urine output is picked up.  Creatinine continues to rise between dialysis.  I am hopeful he has some chance of renal recovery given his urine output is picked up so much.  However, he is going to have to receive IV contrast which could hinder his improvement.  Unable to CLIP as AKI due to no insurance.  We will continue to reevaluate his qualifications of his ESRD routinely.  SP cardiac arrest - asystolic arrest in setting of prob asp PNA/ ARDS in setting of EtOH LE neurologic deficits - ortho and neuro as well as NSG seeing-  thought is fluid in psoas muscle could be causing some compression but hard to say Possible ischemic bowel - surgery consulting-  ex lap this past week did not show ischemic bowel needing resection but with some edema in bowel Psoas abscess: ID following. IR for draining but requires CT scan with contrast first.   We did discuss the benefits and risks of proceeding with IV contrast at this time.  It is felt that the benefits outweigh the risks.  We will monitor kidney function closely after contrast Anemia- s/p transfusion on 1/31. No iron for now. Continue ESA. Bones-  phos has improved with binders   2/31  Lisbeth Renshaw  06/12/2021, 10:54 AM  [facility]

## 2021-06-12 NOTE — Progress Notes (Signed)
Occupational Therapy Treatment Patient Details Name: Andres EdelsonOscar Diaz Braun MRN: [id number] DOB: 07/24/1975 Today's Date: 06/12/2021   History of present illness 46 y.o. male admitted 05/24/21 after cardiac arrest at home; pt with agonal breathing, lost pulse, required 20-min CPR and 5 rounds epi. Workup for acute hypoxic respiratory failure, septic shock due to undertermined organism, AKI, acute metabolic encephalopathy, aspiration PNA of bilateral lower lobes due to vomit. ETT 1/15-1/19. CRRT 1/15-1/20. Brain MRI 1/22 negative. Cervical MRI 1/22 with small R-side disc protrusion C5-6. Abdominal CT showed muscular swelling, particularly iliopsoas and gluteal muscles consistent with rhabdomyolysis; hemorrhage noted within R iliopsoas. S/p diagnostic laparoscopy 1/24. MRI L femur 1/25 showed diffuse muscular abnormalities bilateral thighs with heterogenous T2 signal, especially within L glut max and gradratus femoris; findings compatible with dx of rhabdomyolysis, no drainable abscess identified; these findings could contribute to compartment syndrome. Unknown PMH.   OT comments  This 46 yo male admitted with above seen today with PT to see if we could progress mobility. Pt did better with moving LLE but worse with moving RLE (knee painful), did better with overall bed mobility for up to EOB, stood in sara stedy x3 briefly (<10 seconds each time) and c/o severe pain in right knee when he sat down from sara stedy to bed (per interpreter he said "it felt like by knee was being pulled away from my body)--called RN again to ask for pain meds and applied ice as well. We will continue to follow--pt continues to be limited by pain and decreased ability to move his Bil LEs. We will continue to follow. Goals updated this session.   Recommendations for follow up therapy are one component of a multi-disciplinary discharge planning process, led by the attending physician.  Recommendations may be updated based on patient  status, additional functional criteria and insurance authorization.    Follow Up Recommendations  Acute inpatient rehab (3hours/day)    Assistance Recommended at Discharge Frequent or constant Supervision/Assistance  Patient can return home with the following  Two people to help with walking and/or transfers;Two people to help with bathing/dressing/bathroom;Assistance with cooking/housework;Assist for transportation;Direct supervision/assist for medications management;Help with stairs or ramp for entrance   Equipment Recommendations  BSC/3in1;Wheelchair (measurements OT);Wheelchair cushion (measurements OT)       Precautions / Restrictions Precautions Precautions: Fall Precaution Comments: really painful right knee now (orginally issue was with LLE--still is too) Restrictions Weight Bearing Restrictions: No       Mobility Bed Mobility Overal bed mobility: Needs Assistance Bed Mobility: Supine to Sit, Sit to Supine     Supine to sit: Mod assist, +2 for safety/equipment Sit to supine: Mod assist, +2 for physical assistance   General bed mobility comments: heavy use of bed rails for trunk elevation. Assist for bringing LEs off bed and trunk elevation. Able to adduct R LE to bring to EOB and slight abduction of L LE this date    Transfers Overall transfer level: Needs assistance Equipment used: Ambulation equipment used Transfers: Sit to/from Stand Sit to Stand: Max assist, +2 physical assistance, +2 safety/equipment           General transfer comment: requires assistance to place bilateral LE on stedy frame due to weakness. Patient with immediate complaints of pain in legs. Patient able to come to standing from bed surface and Stedy frame with maxA+2. Patient with severe trunk flexion and heavy use of UEs to push up into standing. Patient with severe R knee pain and deferred further standing and returned  to supine Transfer via Lift Equipment: Stedy   Balance Overall balance  assessment: Needs assistance Sitting-balance support: Feet supported, Bilateral upper extremity supported Sitting balance-Leahy Scale: Fair     Standing balance support: Bilateral upper extremity supported Standing balance-Leahy Scale: Zero Standing balance comment: When he stands with sara stedy he starts to come up and then hunches over propping on hand rest of sara stedy                           ADL either performed or assessed with clinical judgement   ADL Overall ADL's : Needs assistance/impaired                         Toilet Transfer: Maximal assistance;+2 for physical assistance Toilet Transfer Details (indicate cue type and reason): sit<>stand with use of sara stedy                Extremity/Trunk Assessment Upper Extremity Assessment Upper Extremity Assessment: Overall WFL for tasks assessed            Vision Ability to See in Adequate Light: 0 Adequate            Cognition Arousal/Alertness: Awake/alert Behavior During Therapy: Flat affect Overall Cognitive Status: Impaired/Different from baseline Area of Impairment: Attention, Memory, Following commands, Safety/judgement, Awareness, Problem solving                     Memory: Decreased recall of precautions, Decreased short-term memory Following Commands: Follows one step commands with increased time Safety/Judgement: Decreased awareness of safety, Decreased awareness of deficits Awareness: Intellectual Problem Solving: Slow processing, Difficulty sequencing                     Pertinent Vitals/ Pain       Pain Assessment Pain Assessment: Faces Faces Pain Scale: Hurts worst (initally 3; 10 after sat down on EOB from sara stedy) Pain Location: bilateral LE (R>L) Pain Descriptors / Indicators: Grimacing, Sharp, Guarding Pain Intervention(s): Monitored during session, Premedicated before session, Ice applied         Frequency  Min 2X/week        Progress  Toward Goals  OT Goals(current goals can now be found in the care plan section)  Progress towards OT goals: Progressing toward goals  Acute Rehab OT Goals OT Goal Formulation: With patient Time For Goal Achievement: 06/26/21 Potential to Achieve Goals: Good  Plan Discharge plan remains appropriate    Co-evaluation    PT/OT/SLP Co-Evaluation/Treatment: Yes Reason for Co-Treatment: Complexity of the patient's impairments (multi-system involvement);For patient/therapist safety;To address functional/ADL transfers PT goals addressed during session: Mobility/safety with mobility;Balance;Strengthening/ROM OT goals addressed during session: Strengthening/ROM      AM-PAC OT "6 Clicks" Daily Activity     Outcome Measure   Help from another person eating meals?: None Help from another person taking care of personal grooming?: A Little Help from another person toileting, which includes using toliet, bedpan, or urinal?: A Lot Help from another person bathing (including washing, rinsing, drying)?: A Lot Help from another person to put on and taking off regular upper body clothing?: A Little Help from another person to put on and taking off regular lower body clothing?: Total 6 Click Score: 15    End of Session Equipment Utilized During Treatment: Gait belt  OT Visit Diagnosis: Unsteadiness on feet (R26.81);Other abnormalities of gait and mobility (R26.89);Muscle weakness (generalized) (M62.81);Other  symptoms and signs involving cognitive function;Pain Pain - Right/Left: Right Pain - part of body: Knee   Activity Tolerance Patient limited by pain   Patient Left in bed;with call bell/phone within reach;with bed alarm set;with nursing/sitter in room   Nurse Communication Patient requests pain meds        Time: 9024-0973 OT Time Calculation (min): 26 min  Charges: OT General Charges $OT Visit: 1 Visit OT Treatments $Therapeutic Activity: 8-22 mins  Ignacia Palma, OTR/L Acute  [employer] Pager [phone] Office [phone]    Evette Georges 06/12/2021, 2:42 PM

## 2021-06-12 NOTE — Assessment & Plan Note (Addendum)
No acute injuries neurological function remained stable

## 2021-06-12 NOTE — Assessment & Plan Note (Addendum)
S/p PEA arrest in the context of significant metabolic acidosis, s/p CPR x 20 minutes and Epi x 5. Echocardiogram 1/15 with preserved LV function Not on a statin

## 2021-06-12 NOTE — Assessment & Plan Note (Addendum)
with metabolic acidosis/rhabdomyolysis Acquired CRRT 1/15-1/19/2023. Nephrology had been following but renal function has recovered Redeveloped mild AKI and worsening thrombocytosis-evaluated by hematology AKI has resolved with hydration and correction of mild rhabdomyolosis

## 2021-06-12 NOTE — Assessment & Plan Note (Addendum)
°  2/14                              2/21        2/24  2/27 Continue wound care daily.  Patient will apply a nickel thick layer of Santyl over the wound cover with a moistened saline gauze then cover with dry gauze and secure with a foam dressing. Abn ABIs/arterial flow contributing to slow healing

## 2021-06-12 NOTE — Assessment & Plan Note (Addendum)
Resolved but patient appears very depressed Able to communicate with simple responses to translators I do not suspect brain injury Appears to be depressed -we will add SSRI Patient separated from wife and unclear timing of this in relation to events preceding hospitalization

## 2021-06-12 NOTE — Assessment & Plan Note (Addendum)
Resolved. Stable on room air.  

## 2021-06-12 NOTE — Progress Notes (Signed)
°   06/12/21 0906  Assess: MEWS Score  Temp 98.4 F (36.9 C)  BP (!) 144/88  Pulse Rate (!) 121  Resp 17  SpO2 94 %  O2 Device Room Air  Assess: MEWS Score  MEWS Temp 0  MEWS Systolic 0  MEWS Pulse 2  MEWS RR 0  MEWS LOC 0  MEWS Score 2  MEWS Score Color Yellow  Assess: if the MEWS score is Yellow or Red  Were vital signs taken at a resting state? Yes  Focused Assessment No change from prior assessment  Early Detection of Sepsis Score *See Row Information* Medium  MEWS guidelines implemented *See Row Information* No, previously yellow, continue vital signs every 4 hours  Treat  MEWS Interventions Administered scheduled meds/treatments;Other (Comment) (MD awar.)  Pain Scale 0-10  Pain Score 4  Faces Pain Scale 2  Pain Type Acute pain  Pain Location Leg  Pain Orientation Right;Left  Pain Descriptors / Indicators Heaviness  Pain Frequency Intermittent  Pain Onset On-going  Patients Stated Pain Goal 0  Pain Intervention(s) Repositioned  Multiple Pain Sites No  Take Vital Signs  Increase Vital Sign Frequency  Yellow: Q 2hr X 2 then Q 4hr X 2, if remains yellow, continue Q 4hrs  Escalate  MEWS: Escalate Yellow: discuss with charge nurse/RN and consider discussing with provider and RRT  Notify: Charge Nurse/RN  Name of Charge Nurse/RN Notified Candice R.N.  Date Charge Nurse/RN Notified 06/12/21  Time Charge Nurse/RN Notified 0900  Notify: Provider  Provider Name/Title [email or web address]  Date Provider Notified 06/12/21  Time Provider Notified [phone]  Notification Type Rounds  Notification Reason Other (Comment)  Provider response In department  Date of Provider Response 06/12/21  Time of Provider Response [phone]

## 2021-06-12 NOTE — Progress Notes (Signed)
IP rehab admissions - patient has no confirmed DC plan and has no confirmed caregiver support.  Likely will not be able to offer a bed on CIR d/t lack of caregiver support.  Call for questions.  [phone]

## 2021-06-12 NOTE — Plan of Care (Signed)
  Problem: Activity: Goal: Risk for activity intolerance will decrease Outcome: Not Progressing   

## 2021-06-12 NOTE — Assessment & Plan Note (Signed)
Improving.  Likely 2/2 aki

## 2021-06-12 NOTE — Progress Notes (Signed)
Progress Note   Patient: Andres Braun XTA:[id number] DOB: [birth year] DOA: 05/24/2021     19 DOS: the patient was seen and examined on 06/12/2021   Brief hospital course: 46 year old male admitted to ICU 1/15 under PCCM care after cardiac arrest at home.  PEA arrest, s/p CPR x20 minutes and epi x5.  Patient reportedly out drinking with his friends the night prior to admission.  Found by friends with agonal breathing.  Intubated on arrival, needed vasopressors, noted to have renal failure, rhabdomyolysis with CK in the 14 K range, treated for aspiration pneumonia, nephrology consulted and started on CRRT.  Extubated 1/19 and off pressors.  Mental status slowly improved.  Transferred to telemetry.  Care transferred to [facility] on 1/24.  Nephrology following for HD needs.  General surgery on board for questionable small bowel ischemia and taken emergently to surgery 1/24. The bowel in question was found to be thickened, but viable. Hospital course complicated by unexplained left lower extremity weakness. Extensive work up of the neuraxis is negative for compressive pathology. Differential is probably a psoas hematoma causing lumbar plexus compression/ irritation leading to secondary weakness. IR suggested that it is not amenable for aspiration or drain placement. Neurology consulted, suggested not a neurological injury as the neuraxis is negative.    2/2 feels little better this am. No new complaints. No overnight issues. Tmax  101.7.  2/3 still with b/lLE pain. Spoke to IR and nephrology as pt needs IV ct contrast for IR drainage.   Assessment and Plan: * Cardiac arrest with pulseless electrical activity (HCC) S/p PEA arrest, s/p CPR x 20 minutes and Epi x 5.   AKI (acute kidney injury) (Andres Braun)- (present on admission) with metabolic acidosis/rhabdomyolysis Patient has been off CRRT 1/15-1/19/2023. Nephrology on board and appreciate recommendations.  Hoping for renal recovery, if no recovery will need a  perm cath next week.   Improving CK levels. 2/2 intermittent HD now on MWF schedule. Hold off on permanent access creation since febrile 2/3 will receive IV ct contrast for IR procedure, needs close monitoring. Nephrology aware  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Andres Braun) Had fever on 2/2 Bcx obtained Cxr possible atelectasis Continue vancomycin 2/3 will go for IR drainage  Thrombocytosis Reactive or from infection  Lower extremity weakness with left foot drop MRI of the neuraxis  was unremarkable for acute process .  As per the MRI of the lumbar spine, multiple fluid collections in the right ileo psoas muscle.  Dr Radene Gunning spoke with neurology and neuro surgery on 1/24, no surgical cause for his lower extremity weakness. He suggested that a psoas abscess or hematoma could cause lumbar plexus compression /irritation leading to secondary weakness.   IR consulted for aspiration, suggested any aspiration would predispose to infection/ not amenable for aspiration or drain placement. Did not advise aspiration.  MRI of the femur shows Diffuse muscular abnormalities throughout the visualized portions of both thighs with heterogeneous T2 signal and enhancement, especially within the left gluteus maximus and quadratus femoris muscles. Findings are compatible with the diagnosis of rhabdomyolysis, with the differential including myositis related to connective tissue disease and polymyositis. No drainable abscess identified. pt unable to move the left lower extremity from severe pain and weakness.  Orthopedics consulted, suggested its a neurological/ lumbar plexus ischemic injury.  Hoping for recovery with therapy evaluations.  CK levels are improving. Vitamin b12 and folate levels wnl. ESR is 85, CRP is 5.4   Pt has good pulse in DP, no sensory deficits. No signs of  compartment syndrome.  2/2 continue gabapentin 2/3 CIR pending    Hyponatremia Improving.  Likely 2/2 aki  Acute posthemorrhagic anemia possiblly from  ?psoas hematoma Stool fob + on 1/21 , suspected from colitis  Elevated liver enzymes Probably from ETOH abuse, shock liver and rhabdomyolysis.  Much improved  Mechanical fall 1/24 and hit his head -As per nursing report, prior to going to the OR, patient attempted to get out of the bed, fell and hit his head without any obvious external injuries or LOC.  Patient's friend was in the room. Advised nursing regarding strict fall precautions, placement of safety sitter if needed and monitor closely.  right anterior knee wound wound care consulted  Ischemic enteritis versus colitis with ileus General surgery consulted, there was concerned about an ischemic bowel.  Patient was taken to the OR for diagnostic laparoscopy, surgery revealed bowel in question is thickened,  no ischemia was seen.  hence no further intervention at this time.  Advance diet as tolerated without any issues at this time. Diarrhea improved with imodium.   C. difficile PCR and GI panel negative  2/3 continue to monitor  Protein-calorie malnutrition, severe Supplement was added  Aspiration pneumonia of both lower lobes due to vomit (Andres Braun)- (present on admission) Completed course of abx  Acute metabolic encephalopathy- (present on admission) Resolved, at baseline  Septic shock due to undetermined organism Andres Braun)- (present on admission) Vital stable  Acute respiratory failure with hypoxia and hypercapnia (Andres Braun)- (present on admission) Patient intubated on 05/24/2021 and extubated on 05/28/2021 .  patient completed course of antibiotics for aspiration pneumonia  2/3 currently on room air        Subjective: no new complaints. Still with LE weakness. Tmax 101.8. denies sob, cp, abd pain  Physical Exam: Vitals:   06/11/21 2057 06/12/21 0110 06/12/21 0508 06/12/21 0906  BP: (!) 146/84 138/77 (!) 146/80 (!) 144/88  Pulse: (!) 126 (!) 119 (!) 125 (!) 121  Resp: 18 18 18 17   Temp: (!) 100.9 F (38.3 C) 99.1 F (37.3  C) (!) 101.8 F (38.8 C) 98.4 F (36.9 C)  TempSrc: Oral  Oral Oral  SpO2: 100% 98% 99% 94%  Weight:      Height:       Calm, NAD Cta no w/r Reg s1/s2 no gallop Soft benign +bs No edema. Able to wiggle Rt foot. Unable to move both foot  Aaoxox3  Mood and affect appropriate in current setting   Data Reviewed:  Bmp reviewed,micro reviewed. Cxr reviewd  Family Communication: none at bedside  Disposition: Status is: Inpatient Remains inpatient appropriate because: iv treatment, needs HD, needs IR procedure, still febrile          Planned Discharge Destination:  TBD     Time spent: 35 minutes  Author: Nolberto Hanlon, MD 06/12/2021 1:31 PM  For on call review [email or web address].

## 2021-06-12 NOTE — Assessment & Plan Note (Addendum)
Bcx negative Cxr possible atelectasis See additional documentation under PVD and recent rhabdomyolysis ID has changed anbxs to PO doxy/augmentin for 16 days with first dose on 2/15. LD anbxs 07/10/21

## 2021-06-12 NOTE — Assessment & Plan Note (Addendum)
Reactive secondary to low iron and acute critical illness anemia Was given IV iron by hematology team who has subsequently signed off

## 2021-06-12 NOTE — Assessment & Plan Note (Addendum)
Mild transaminitis has context of reemergence of mild rhabdomyollosis Of note CT this admission shows no evidence of cirrhosis

## 2021-06-12 NOTE — Progress Notes (Signed)
Patient has no visible veins as per assessed by two nurses.

## 2021-06-12 NOTE — Assessment & Plan Note (Addendum)
Nutrition Status: Nutrition Problem: Severe Malnutrition Etiology: acute illness (cardiac arrest, AKI, ileus) Signs/Symptoms: energy intake < or equal to 50% for > or equal to 5 days, percent weight loss (7.4% weight loss in less than 2 weeks) Percent weight loss: 7.4 % Interventions: Ensure Enlive (each supplement provides 350kcal and 20 grams of protein), Hormel Shake, MVI BMI 20.72 As of 2/22 albumin is 2.5

## 2021-06-12 NOTE — Progress Notes (Signed)
°   06/12/21 1418  Assess: MEWS Score  Temp 100.1 F (37.8 C)  BP (!) 133/91  Pulse Rate (!) 125  Resp 18  SpO2 97 %  O2 Device Room Air  Assess: MEWS Score  MEWS Temp 0  MEWS Systolic 0  MEWS Pulse 2  MEWS RR 0  MEWS LOC 0  MEWS Score 2  MEWS Score Color Yellow  Assess: if the MEWS score is Yellow or Red  Were vital signs taken at a resting state? Yes  Focused Assessment No change from prior assessment  Early Detection of Sepsis Score *See Row Information* Medium  MEWS guidelines implemented *See Row Information* No, previously yellow, continue vital signs every 4 hours  Treat  MEWS Interventions Administered prn meds/treatments  Pain Score 6  Faces Pain Scale 2  Pain Type Acute pain  Pain Location Leg  Pain Orientation Right;Left  Pain Descriptors / Indicators Heaviness  Pain Frequency Intermittent  Pain Onset On-going  Patients Stated Pain Goal 0  Pain Intervention(s) Medication (See eMAR)  Multiple Pain Sites No  Take Vital Signs  Increase Vital Sign Frequency  Yellow: Q 2hr X 2 then Q 4hr X 2, if remains yellow, continue Q 4hrs  Notify: Charge Nurse/RN  Name of Charge Nurse/RN Notified Candice R.N  Date Charge Nurse/RN Notified 06/12/21  Time Charge Nurse/RN Notified 1418

## 2021-06-12 NOTE — Assessment & Plan Note (Addendum)
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

## 2021-06-12 NOTE — Hospital Course (Addendum)
46 year old male admitted to ICU 1/15 under PCCM care after cardiac arrest at home.  PEA arrest, s/p CPR x20 minutes and epi x5.  Patient reportedly out drinking with his friends the night prior to admission.  Found by friends with agonal breathing.  Intubated on arrival, needed vasopressors, noted to have renal failure, rhabdomyolysis with CK in the 14 K range, treated for aspiration pneumonia, nephrology consulted and started on CRRT.  Extubated 1/19 and off pressors.  Mental status slowly improved.  Transferred to telemetry.  Care transferred to [facility] on 1/24.  Nephrology following for HD needs.  General surgery on board for questionable small bowel ischemia and taken emergently to surgery 1/24. The bowel in question was found to be thickened, but viable. Hospital course complicated by unexplained left lower extremity weakness. Extensive work up of the neuraxis is negative for compressive pathology. Differential is probably a psoas hematoma causing lumbar plexus compression/ irritation leading to secondary weakness. IR suggested that it is not amenable for aspiration or drain placement. Neurology consulted, suggested not a neurological injury as the neuraxis is negative.

## 2021-06-12 NOTE — Progress Notes (Signed)
Physical Therapy Treatment Patient Details Name: Andres EdelsonOscar Diaz Braun MRN: [id number] DOB: 03/25/1976 Today's Date: 06/12/2021   History of Present Illness 46 y.o. male admitted 05/24/21 after cardiac arrest at home; pt with agonal breathing, lost pulse, required 20-min CPR and 5 rounds epi. Workup for acute hypoxic respiratory failure, septic shock due to undertermined organism, AKI, acute metabolic encephalopathy, aspiration PNA of bilateral lower lobes due to vomit. ETT 1/15-1/19. CRRT 1/15-1/20. Brain MRI 1/22 negative. Cervical MRI 1/22 with small R-side disc protrusion C5-6. Abdominal CT showed muscular swelling, particularly iliopsoas and gluteal muscles consistent with rhabdomyolysis; hemorrhage noted within R iliopsoas. S/p diagnostic laparoscopy 1/24. MRI L femur 1/25 showed diffuse muscular abnormalities bilateral thighs with heterogenous T2 signal, especially within L glut max and gradratus femoris; findings compatible with dx of rhabdomyolysis, no drainable abscess identified; these findings could contribute to compartment syndrome. Unknown PMH.    PT Comments    Patient continues to be limited by pain in bilateral Les, however this date R LE is more painful than L. Patient with improved muscle activation of L LE this date with ability to adduct and trace muscle activation for hip flexion and hip abduction. Patient performs better when pain is more controlled. Patient requires maxA+2 to stand with Stedy and uses compensatory strategies of trunk flexion and heavy use of UE to come into standing. Patient reports severe R LE pain at knee following standing, applied ice to see if pain improves. RN present to provide pain medication at end of session. Continue to recommend CIR level therapies to assist with maximizing functional mobility and safety.     Recommendations for follow up therapy are one component of a multi-disciplinary discharge planning process, led by the attending physician.   Recommendations may be updated based on patient status, additional functional criteria and insurance authorization.  Follow Up Recommendations  Acute inpatient rehab (3hours/day)     Assistance Recommended at Discharge Frequent or constant Supervision/Assistance  Patient can return home with the following Two people to help with walking and/or transfers;Help with stairs or ramp for entrance;A lot of help with bathing/dressing/bathroom;Assistance with cooking/housework;Assist for transportation;Direct supervision/assist for financial management;Direct supervision/assist for medications management;A lot of help with walking and/or transfers   Equipment Recommendations  Wheelchair (measurements PT);Wheelchair cushion (measurements PT);Other (comment);Hospital bed;BSC/3in1    Recommendations for Other Services       Precautions / Restrictions Precautions Precautions: Fall Restrictions Weight Bearing Restrictions: No     Mobility  Bed Mobility Overal bed mobility: Needs Assistance Bed Mobility: Supine to Sit, Sit to Supine     Supine to sit: Mod assist, +2 for safety/equipment Sit to supine: Mod assist, +2 for physical assistance   General bed mobility comments: heavy use of bed rails for trunk elevation. Assist for bringing LEs off bed and trunk elevation. Able to adduct R LE to bring to EOB and slight abduction of L LE this date    Transfers Overall transfer level: Needs assistance Equipment used: Ambulation equipment used Transfers: Sit to/from Stand Sit to Stand: Max assist, +2 physical assistance, +2 safety/equipment           General transfer comment: requires assistance to place bilateral LE on stedy frame due to weakness. Patient with immediate complaints of pain in legs. Patient able to come to standing from bed surface and Stedy frame with maxA+2. Patient with severe trunk flexion and heavy use of UEs to push up into standing. Patient with severe R knee pain and  deferred further standing  and returned to supine Transfer via Lift Equipment: Stedy  Ambulation/Gait                   Stairs             Wheelchair Mobility    Modified Rankin (Stroke Patients Only)       Balance Overall balance assessment: Needs assistance Sitting-balance support: Feet supported, Bilateral upper extremity supported Sitting balance-Leahy Scale: Fair     Standing balance support: Bilateral upper extremity supported Standing balance-Leahy Scale: Zero                              Cognition Arousal/Alertness: Awake/alert Behavior During Therapy: Flat affect Overall Cognitive Status: Impaired/Different from baseline Area of Impairment: Attention, Memory, Following commands, Safety/judgement, Awareness, Problem solving                   Current Attention Level: Sustained Memory: Decreased recall of precautions, Decreased short-term memory Following Commands: Follows one step commands with increased time Safety/Judgement: Decreased awareness of safety, Decreased awareness of deficits Awareness: Intellectual Problem Solving: Slow processing, Difficulty sequencing          Exercises General Exercises - Lower Extremity Hip ABduction/ADduction: AAROM, Both, 5 reps, Supine Hip Flexion/Marching: AAROM, Both, 5 reps, Supine    General Comments        Pertinent Vitals/Pain Pain Assessment Pain Assessment: Faces Faces Pain Scale: Hurts worst (initially 3) Pain Location: bilateral LE (R>L) Pain Descriptors / Indicators: Grimacing, Sharp, Guarding Pain Intervention(s): Monitored during session, Repositioned, Ice applied    Home Living                          Prior Function            PT Goals (current goals can now be found in the care plan section) Acute Rehab PT Goals Patient Stated Goal: to do more PT Goal Formulation: With patient Time For Goal Achievement: 06/12/21 Potential to Achieve Goals:  Fair Progress towards PT goals: Progressing toward goals    Frequency    Min 3X/week      PT Plan Current plan remains appropriate    Co-evaluation PT/OT/SLP Co-Evaluation/Treatment: Yes Reason for Co-Treatment: Complexity of the patient's impairments (multi-system involvement);For patient/therapist safety;To address functional/ADL transfers PT goals addressed during session: Mobility/safety with mobility;Balance;Strengthening/ROM        AM-PAC PT "6 Clicks" Mobility   Outcome Measure  Help needed turning from your back to your side while in a flat bed without using bedrails?: A Lot Help needed moving from lying on your back to sitting on the side of a flat bed without using bedrails?: A Lot Help needed moving to and from a bed to a chair (including a wheelchair)?: Total Help needed standing up from a chair using your arms (e.g., wheelchair or bedside chair)?: Total Help needed to walk in hospital room?: Total Help needed climbing 3-5 steps with a railing? : Total 6 Click Score: 8    End of Session Equipment Utilized During Treatment: Gait belt Activity Tolerance: Patient limited by pain Patient left: in bed;with call bell/phone within reach;with bed alarm set Nurse Communication: Mobility status PT Visit Diagnosis: Other abnormalities of gait and mobility (R26.89);Muscle weakness (generalized) (M62.81);Difficulty in walking, not elsewhere classified (R26.2);Unsteadiness on feet (R26.81)     Time: 1101-1131 PT Time Calculation (min) (ACUTE ONLY): 30 min  Charges:  $Therapeutic Activity:  8-22 mins                     Charlene Detter A. Dan Humphreys PT, DPT Acute Rehabilitation Services Pager [phone] Office [phone]    Viviann Spare 06/12/2021, 12:47 PM

## 2021-06-12 NOTE — Assessment & Plan Note (Addendum)
MRI of the neuraxis was unremarkable for acute process . As per the MRI of the lumbar spine, multiple fluid collections in the right ileo psoas muscle.  Dr Benny Lennert spoke with neurology and neuro surgery on 1/24, no surgical cause for his lower extremity weakness. He suggested that a psoas abscess or hematoma could cause lumbar plexus compression /irritation leading to secondary weakness.  IR consulted for aspiration, suggested any aspiration would predispose to infection/ not amenable for aspiration or drain placement. Did not advise aspiration.  MRI of the femur c/w rhabdomyolysis, with the differential including myositis related to connective tissue disease and polymyositis. No drainable abscess identified. pt unable to move the left lower extremity from severe pain and weakness. Orthopedics consulted, suggested its a neurological/ lumbar plexus ischemic injury.  Hoping for recovery with therapy evaluations.  Vitamin b12 and folate levels wnl. 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 The left toe-brachial index is abnormal.  Vascular evaluated on 2/15 and no acute vascular needs related to abnormal ABIs

## 2021-06-13 ENCOUNTER — Inpatient Hospital Stay (HOSPITAL_COMMUNITY): Payer: Self-pay

## 2021-06-13 LAB — CBC
HCT: 22.7 % — ABNORMAL LOW (ref 39.0–52.0)
Hemoglobin: 7.2 g/dL — ABNORMAL LOW (ref 13.0–17.0)
MCH: 27.4 pg (ref 26.0–34.0)
MCHC: 31.7 g/dL (ref 30.0–36.0)
MCV: 86.3 fL (ref 80.0–100.0)
Platelets: 696 10*3/uL — ABNORMAL HIGH (ref 150–400)
RBC: 2.63 MIL/uL — ABNORMAL LOW (ref 4.22–5.81)
RDW: 14.8 % (ref 11.5–15.5)
WBC: 12 10*3/uL — ABNORMAL HIGH (ref 4.0–10.5)
nRBC: 0 % (ref 0.0–0.2)

## 2021-06-13 LAB — URINE CULTURE: Culture: 20000 — AB

## 2021-06-13 LAB — RENAL FUNCTION PANEL
Albumin: 1.5 g/dL — ABNORMAL LOW (ref 3.5–5.0)
Anion gap: 14 (ref 5–15)
BUN: 70 mg/dL — ABNORMAL HIGH (ref 6–20)
CO2: 22 mmol/L (ref 22–32)
Calcium: 8.1 mg/dL — ABNORMAL LOW (ref 8.9–10.3)
Chloride: 95 mmol/L — ABNORMAL LOW (ref 98–111)
Creatinine, Ser: 8.34 mg/dL — ABNORMAL HIGH (ref 0.61–1.24)
GFR, Estimated: 7 mL/min — ABNORMAL LOW (ref >60)
Glucose, Bld: 99 mg/dL (ref 70–99)
Phosphorus: 7.3 mg/dL — ABNORMAL HIGH (ref 2.5–4.6)
Potassium: 3.9 mmol/L (ref 3.5–5.1)
Sodium: 131 mmol/L — ABNORMAL LOW (ref 135–145)

## 2021-06-13 IMAGING — CT CT PELVIS W/ CM
2 of 4 series · 15 of 46 positions shown, 17 images · IV contrast (APPLIED)
Comparison: CT [DATE], MRI [DATE]

CLINICAL DATA: Psoas abscess

EXAM:
CT PELVIS WITH CONTRAST
TECHNIQUE: Multidetector CT imaging of the pelvis was performed using the
standard protocol following the bolus administration of intravenous
contrast.

[Series 8: coronal st · coronal · 0.68mm/px · 3 of 144 slices shown]
[im 48/144  soft-tissue]
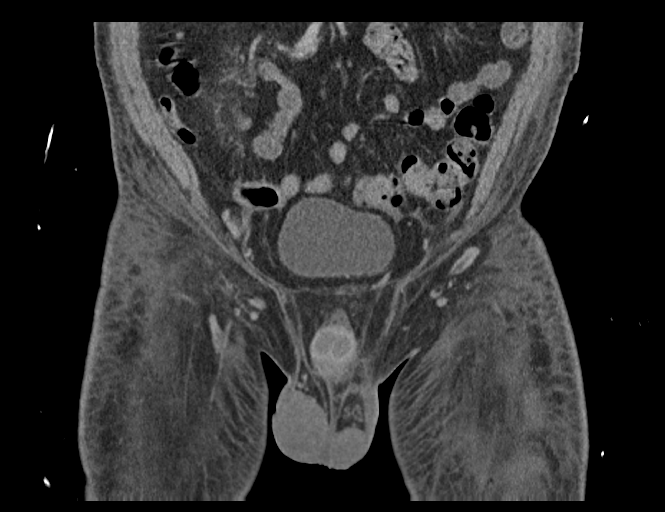
[im 64/144  soft-tissue]
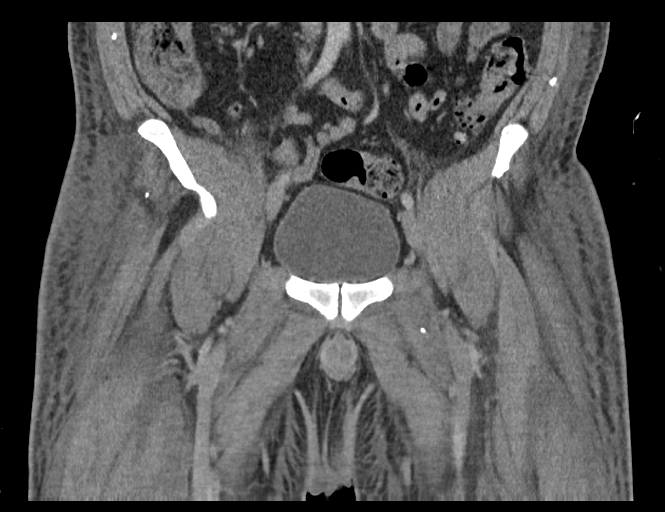
[im 80/144  soft-tissue]
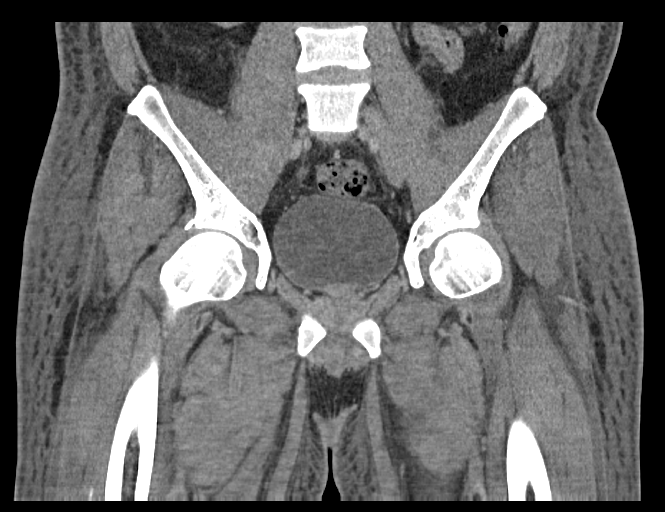

[Series 10: pelvis thin · axial · 0.90mm/px · z∈[+849,+1141]mm · 12 of 533 slices shown, 14 images]
[im 23/533  soft-tissue]
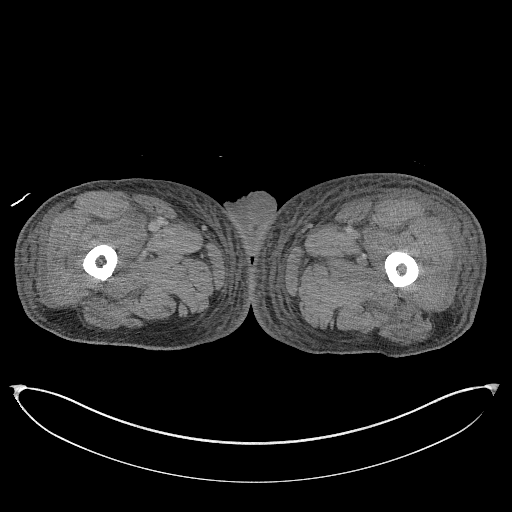
[im 23/533  bone]
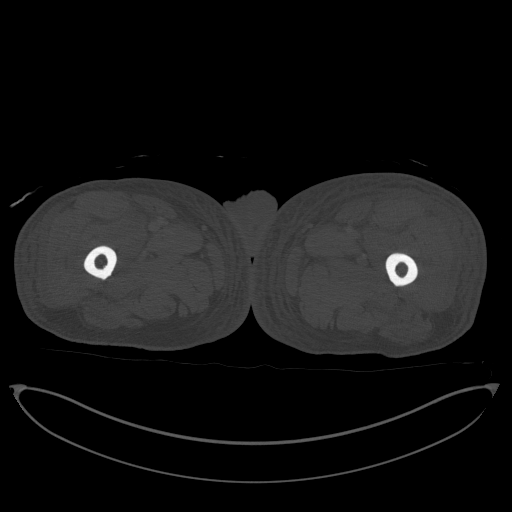
[im 67/533  soft-tissue]
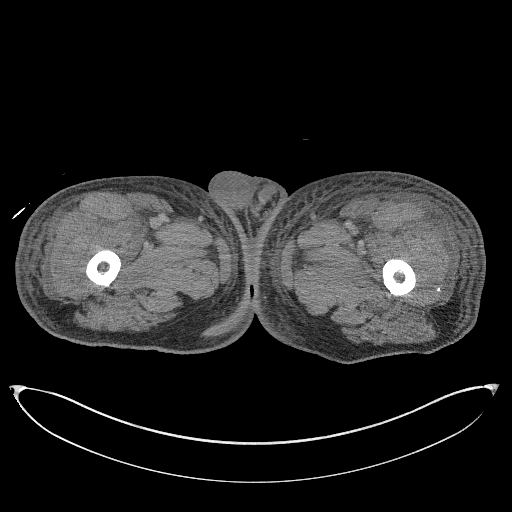
[im 111/533  soft-tissue]
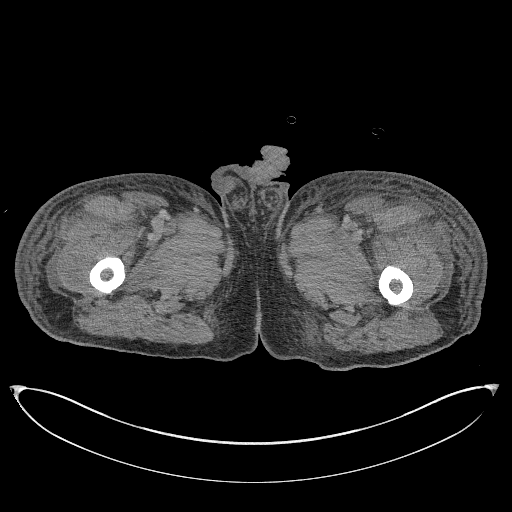
[im 156/533  soft-tissue]
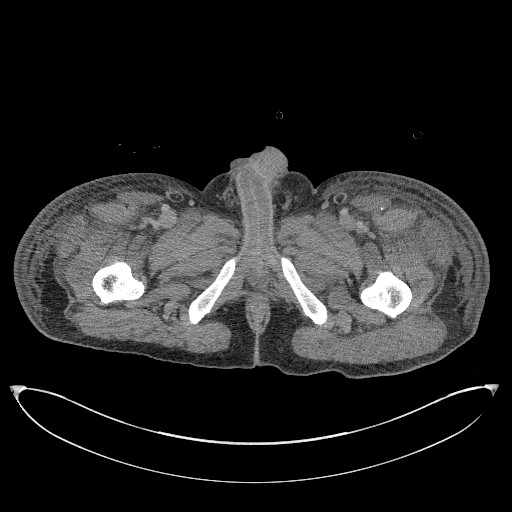
[im 200/533  soft-tissue]
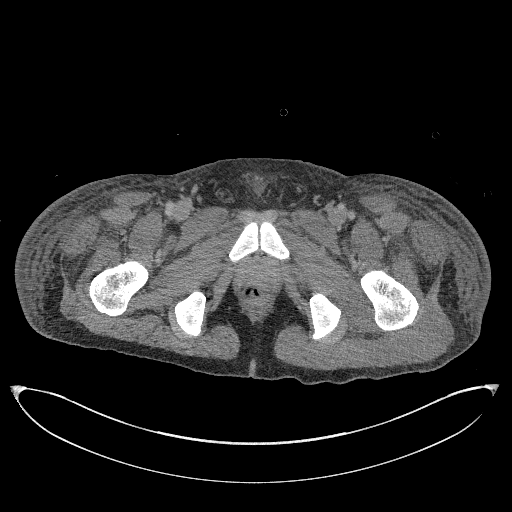
[im 244/533  soft-tissue]
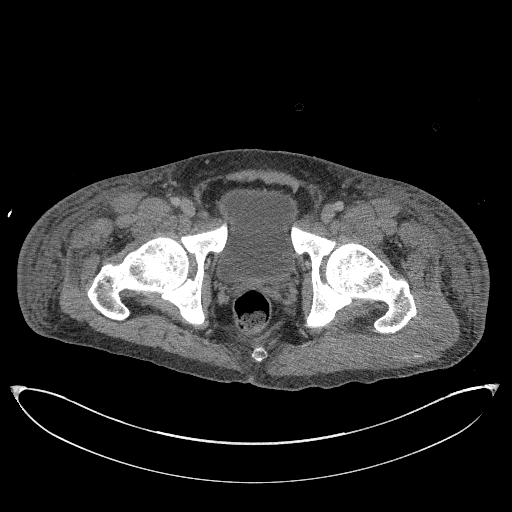
[im 289/533  soft-tissue]
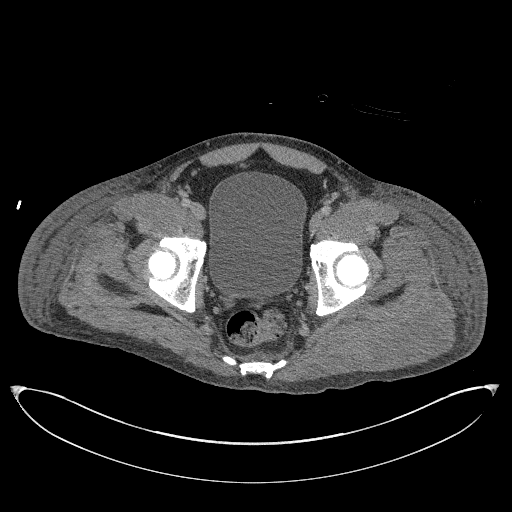
[im 333/533  soft-tissue]
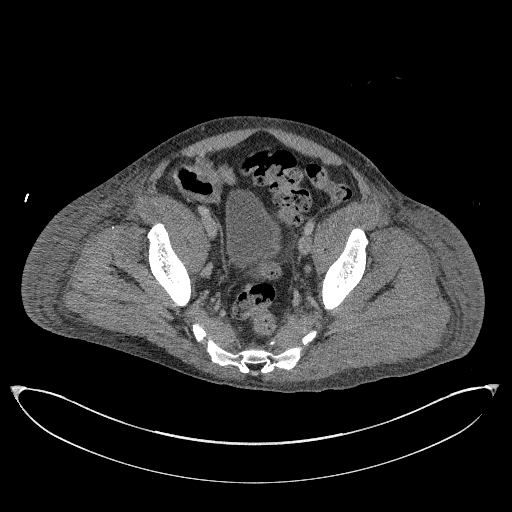
[im 377/533  soft-tissue]
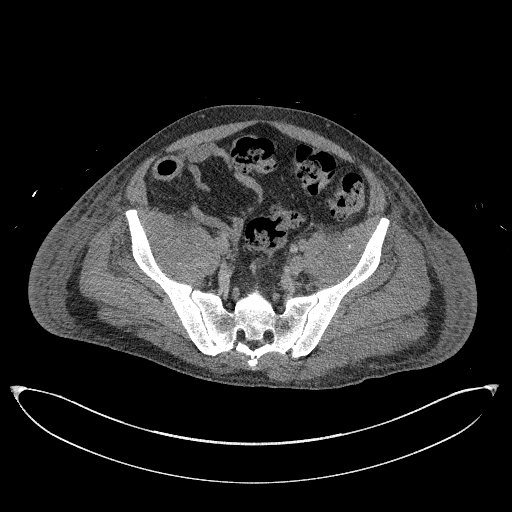
[im 377/533  bone]
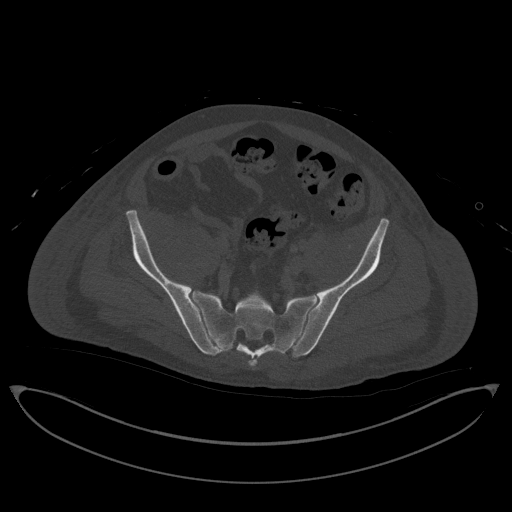
[im 422/533  soft-tissue]
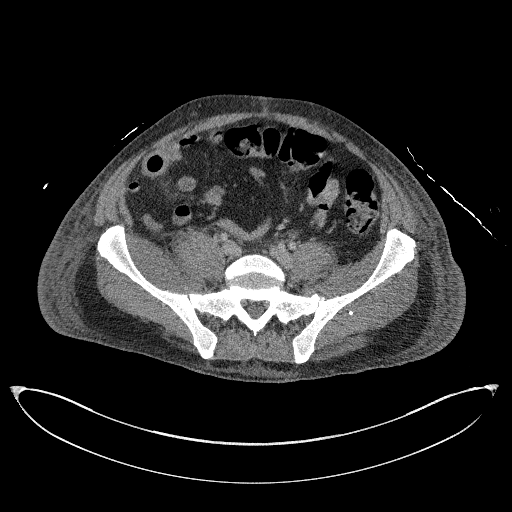
[im 466/533  soft-tissue]
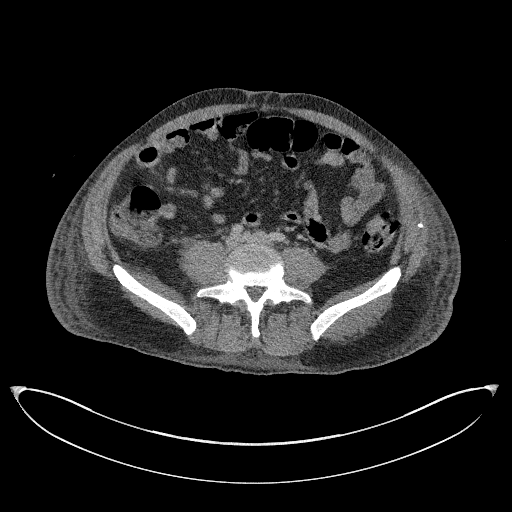
[im 510/533  soft-tissue]
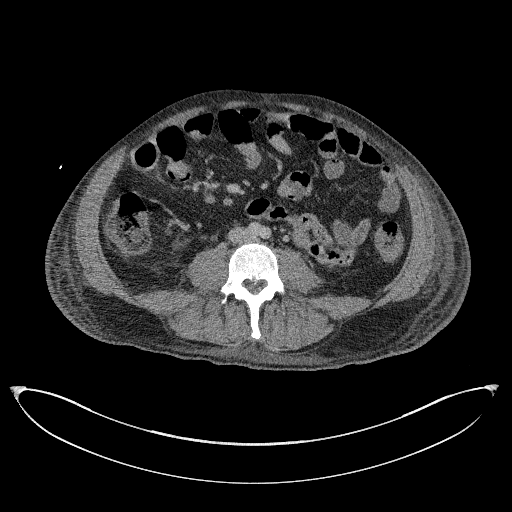

[15 of 46 positions shown; findings below may reference images not displayed]

RADIATION DOSE REDUCTION: This exam was performed according to the
departmental dose-optimization program which includes automated
exposure control, adjustment of the mA and/or kV according to
patient size and/or use of iterative reconstruction technique.

CONTRAST:  80mL OMNIPAQUE IOHEXOL 300 MG/ML  SOLN
FINDINGS: Urinary Tract:  No abnormality visualized.

Bowel: Mild diverticulosis of the distal descending and proximal
sigmoid colon. Moderate stool within the distal colon without
evidence of obstruction. Previously noted circumferential thickening
of the terminal ileum appears improved on the current examination.
No evidence of focal inflammation. Appendix normal. No ascites.

Vascular/Lymphatic: No pathologically enlarged lymph nodes. No
significant vascular abnormality seen.

Reproductive:  The prostate gland is unremarkable.

Other: Diffuse subcutaneous body wall edema extending into the
visualized upper extremities bilaterally has progressed slightly in
the interval since prior examination. Trace retroperitoneal edema
has also developed within the retroperitoneum surrounding the psoas
bilaterally, within the space of Retzius, and within the presacral
space. Heterogeneous attenuation and subtle thickening of the right
iliopsoas musculature is again identified and appears stable since
prior examination. 3 cm targetoid lesion within the right psoas at
axial image # 43/3 is in compatible with the previously identified
intramuscular hematoma and appears decreased in size since prior
examination. No loculated retroperitoneal fluid collection
identified.

Musculoskeletal: No suspicious bone lesions identified.
IMPRESSION: Interval decrease in size in intramuscular hematoma within the right
psoas with stable associated minimal asymmetric muscular thickening.
No loculated retroperitoneal fluid collections identified.

Progressive anasarca.

Improved circumferential bowel wall thickening involving the
terminal ileum and cecum.

Mild distal colonic diverticulosis without superimposed focal
inflammatory change.

## 2021-06-13 MED ORDER — HEPARIN SODIUM (PORCINE) 1000 UNIT/ML IJ SOLN: 2400.0000 [IU] | Freq: Once | INTRAMUSCULAR | Status: AC

## 2021-06-13 MED ORDER — SODIUM CHLORIDE 0.9 % IV SOLN
1.0000 g | INTRAVENOUS | Status: DC
  Administered 2021-06-13 – 2021-06-17 (×5): 1 g via INTRAVENOUS
  Filled 2021-06-13 (×6): qty 1

## 2021-06-13 MED ORDER — ACETAMINOPHEN 325 MG PO TABS
650.0000 mg | ORAL_TABLET | Freq: Four times a day (QID) | ORAL | Status: DC | PRN
  Administered 2021-06-13 – 2021-07-06 (×11): 650 mg via ORAL
  Filled 2021-06-13 (×12): qty 2

## 2021-06-13 MED ORDER — SODIUM CHLORIDE 0.9 % IV SOLN: 1.0000 g | INTRAVENOUS | Status: DC

## 2021-06-13 MED ORDER — IOHEXOL 300 MG/ML  SOLN
80.0000 mL | Freq: Once | INTRAMUSCULAR | Status: AC | PRN
  Administered 2021-06-13: 80 mL via INTRAVENOUS

## 2021-06-13 MED ORDER — ACETAMINOPHEN 325 MG PO TABS
650.0000 mg | ORAL_TABLET | Freq: Once | ORAL | Status: AC
  Administered 2021-06-13: 650 mg via ORAL
  Filled 2021-06-13: qty 2

## 2021-06-13 MED ORDER — HEPARIN SODIUM (PORCINE) 1000 UNIT/ML IJ SOLN
INTRAMUSCULAR | Status: AC
  Administered 2021-06-13: 2400 [IU]
  Filled 2021-06-13: qty 3

## 2021-06-13 NOTE — Progress Notes (Signed)
PROGRESS NOTE    Andres Braun  OBS:[id number] DOB: Jun 07, 1975 DOA: 05/24/2021 PCP: Merryl Hacker, No    Brief Narrative:  46 year old male admitted to ICU 1/15 under PCCM care after cardiac arrest at home.  PEA arrest, s/p CPR x20 minutes and epi x5.  Patient reportedly out drinking with his friends the night prior to admission.  Found by friends with agonal breathing.  Intubated on arrival, needed vasopressors, noted to have renal failure, rhabdomyolysis with CK in the 14 K range, treated for aspiration pneumonia, nephrology consulted and started on CRRT.  Extubated 1/19 and off pressors.  Mental status slowly improved.  Transferred to telemetry.  Care transferred to [facility] on 1/24.  Nephrology following for HD needs.  General surgery on board for questionable small bowel ischemia and taken emergently to surgery 1/24. The bowel in question was found to be thickened, but viable. Hospital course complicated by unexplained left lower extremity weakness. Extensive work up of the neuraxis is negative for compressive pathology. Differential is probably a psoas hematoma causing lumbar plexus compression/ irritation leading to secondary weakness. IR suggested that it is not amenable for aspiration or drain placement. Neurology consulted, suggested not a neurological injury as the neuraxis is negative.    2/2 feels little better this am. No new complaints. No overnight issues. Tmax  101.7.  2/3 still with b/lLE pain. Spoke to IR and nephrology as pt needs IV ct contrast for IR drainage.  2/4 had HD today      Consultants:  Nephrology , ID  Procedures:   Antimicrobials:     Subjective: Has no new complaints. No sob, cp  Objective: Vitals:   06/13/21 1240 06/13/21 1247 06/13/21 1730 06/13/21 1734  BP: 126/85 128/83 122/87 121/86  Pulse: (!) 115 (!) 114 (!) 114 (!) 116  Resp:  17 18 18   Temp:  99.2 F (37.3 C) 100.1 F (37.8 C)   TempSrc:  Oral Oral   SpO2:  98% 100% 99%  Weight:      Height:         Intake/Output Summary (Last 24 hours) at 06/13/2021 1841 Last data filed at 06/13/2021 1700 Gross per 24 hour  Intake 220 ml  Output 1725 ml  Net -1505 ml   Filed Weights   06/10/21 0739 06/10/21 1124 06/13/21 0506  Weight: 87 kg 85.5 kg 86.8 kg    Examination:  General exam: Appears calm and comfortable  Respiratory system: Clear to auscultation. Respiratory effort normal. Cardiovascular system: S1 & S2 heard, RRR. No gallop Gastrointestinal system: Abdomen is nondistended, soft and nontender. Normal bowel sounds heard. Central nervous system: Alert and oriented.  Extremities: no edema Psychiatry: Mood & affect appropriate.     Data Reviewed: I have personally reviewed following labs and imaging studies  CBC: Recent Labs  Lab 06/08/21 1022 06/09/21 0359 06/09/21 1950 06/10/21 0405 06/11/21 0145 06/13/21 0830  WBC 21.8* 19.5*  --  17.3* 13.1* 12.0*  NEUTROABS 18.5* 15.9*  --  14.0*  --   --   HGB 7.3* 6.8* 7.7* 7.4* 7.4* 7.2*  HCT 22.0* 20.4* 23.6* 22.6* 23.5* 22.7*  MCV 85.3 84.6  --  85.3 88.3 86.3  PLT 784* 700*  --  667* 693* 476*   Basic Metabolic Panel: Recent Labs  Lab 06/09/21 0359 06/10/21 0405 06/11/21 0145 06/12/21 0450 06/13/21 0401  NA 128* 129* 132* 131* 131*  K 4.9 4.9 4.1 4.7 3.9  CL 93* 93* 95* 97* 95*  CO2 20* 22 26 23  22  GLUCOSE 123* 106* 118* 105* 99  BUN 46* 61* 37* 55* 70*  CREATININE 7.08* 8.82* 6.01* 7.86* 8.34*  CALCIUM 7.6* 7.9* 7.8* 8.0* 8.1*  PHOS 7.7* 8.3* 5.9* 7.0* 7.3*   GFR: Estimated Creatinine Clearance: 12.3 mL/min (A) (by C-G formula based on SCr of 8.34 mg/dL (H)). Liver Function Tests: Recent Labs  Lab 06/08/21 1022 06/09/21 0359 06/10/21 0405 06/11/21 0145 06/12/21 0450 06/13/21 0401  AST 25  --   --   --   --   --   ALT 16  --   --   --   --   --   ALKPHOS 148*  --   --   --   --   --   BILITOT 0.2*  --   --   --   --   --   PROT 5.3*  --   --   --   --   --   ALBUMIN 1.8*   1.6* 1.8* 1.6* 1.5*  1.6* 1.5*   No results for input(s): LIPASE, AMYLASE in the last 168 hours. No results for input(s): AMMONIA in the last 168 hours. Coagulation Profile: No results for input(s): INR, PROTIME in the last 168 hours. Cardiac Enzymes: Recent Labs  Lab 06/11/21 0145  CKTOTAL 131   BNP (last 3 results) No results for input(s): PROBNP in the last 8760 hours. HbA1C: No results for input(s): HGBA1C in the last 72 hours. CBG: No results for input(s): GLUCAP in the last 168 hours. Lipid Profile: No results for input(s): CHOL, HDL, LDLCALC, TRIG, CHOLHDL, LDLDIRECT in the last 72 hours. Thyroid Function Tests: No results for input(s): TSH, T4TOTAL, FREET4, T3FREE, THYROIDAB in the last 72 hours. Anemia Panel: No results for input(s): VITAMINB12, FOLATE, FERRITIN, TIBC, IRON, RETICCTPCT in the last 72 hours. Sepsis Labs: Recent Labs  Lab 06/06/21 2008 06/07/21 0222 06/08/21 1022  PROCALCITON 0.86 0.93 0.80    Recent Results (from the past 240 hour(s))  Culture, blood (routine x 2)     Status: None   Collection Time: 06/07/21 10:14 AM   Specimen: BLOOD  Result Value Ref Range Status   Specimen Description BLOOD RIGHT ANTECUBITAL  Final   Special Requests   Final    BOTTLES DRAWN AEROBIC ONLY Blood Culture adequate volume   Culture   Final    NO GROWTH 5 DAYS Performed at [facility], [address] [place], [place] [address]    Report Status 06/12/2021 FINAL  Final  Culture, blood (routine x 2)     Status: None   Collection Time: 06/07/21 10:17 AM   Specimen: BLOOD RIGHT HAND  Result Value Ref Range Status   Specimen Description BLOOD RIGHT HAND  Final   Special Requests   Final    BOTTLES DRAWN AEROBIC ONLY Blood Culture adequate volume   Culture   Final    NO GROWTH 5 DAYS Performed at [facility], [place] [address], [place], [place] [address]    Report Status 06/12/2021 FINAL  Final  Gastrointestinal Panel by PCR , Stool     Status: None   Collection Time:  06/07/21  8:38 PM   Specimen: STOOL  Result Value Ref Range Status   Campylobacter species NOT DETECTED NOT DETECTED Final   Plesimonas shigelloides NOT DETECTED NOT DETECTED Final   Salmonella species NOT DETECTED NOT DETECTED Final   Yersinia enterocolitica NOT DETECTED NOT DETECTED Final   Vibrio species NOT DETECTED NOT DETECTED Final  Vibrio cholerae NOT DETECTED NOT DETECTED Final   Enteroaggregative E coli (EAEC) NOT DETECTED NOT DETECTED Final   Enteropathogenic E coli (EPEC) NOT DETECTED NOT DETECTED Final   Enterotoxigenic E coli (ETEC) NOT DETECTED NOT DETECTED Final   Shiga like toxin producing E coli (STEC) NOT DETECTED NOT DETECTED Final   Shigella/Enteroinvasive E coli (EIEC) NOT DETECTED NOT DETECTED Final   Cryptosporidium NOT DETECTED NOT DETECTED Final   Cyclospora cayetanensis NOT DETECTED NOT DETECTED Final   Entamoeba histolytica NOT DETECTED NOT DETECTED Final   Giardia lamblia NOT DETECTED NOT DETECTED Final   Adenovirus F40/41 NOT DETECTED NOT DETECTED Final   Astrovirus NOT DETECTED NOT DETECTED Final   Norovirus GI/GII NOT DETECTED NOT DETECTED Final   Rotavirus A NOT DETECTED NOT DETECTED Final   Sapovirus (I, II, IV, and V) NOT DETECTED NOT DETECTED Final    Comment: Performed at [facility], [address] [place], [place] [address]  C Difficile Quick Screen (NO PCR Reflex)     Status: None   Collection Time: 06/07/21  8:38 PM   Specimen: STOOL  Result Value Ref Range Status   C Diff antigen NEGATIVE NEGATIVE Final   C Diff toxin NEGATIVE NEGATIVE Final   C Diff interpretation No C. difficile detected.  Final    Comment: Performed at [facility], [place] [address], [place], [place] [address]  Urine Culture     Status: Abnormal   Collection Time: 06/10/21  6:37 PM   Specimen: Urine, Clean Catch  Result Value Ref Range Status   Specimen Description URINE, CLEAN CATCH  Final   Special Requests   Final    NONE Performed at [facility], [place] [address] [place], [place] [address]    Culture 20,000 COLONIES/mL PSEUDOMONAS AERUGINOSA (A)  Final   Report Status 06/13/2021 FINAL  Final   Organism ID, Bacteria PSEUDOMONAS AERUGINOSA (A)  Final      Susceptibility   Pseudomonas aeruginosa - MIC*    CEFTAZIDIME 4 SENSITIVE Sensitive     CIPROFLOXACIN <=0.25 SENSITIVE Sensitive     GENTAMICIN <=1 SENSITIVE Sensitive     IMIPENEM 1 SENSITIVE Sensitive     PIP/TAZO 8 SENSITIVE Sensitive     CEFEPIME 2 SENSITIVE Sensitive     * 20,000 COLONIES/mL PSEUDOMONAS AERUGINOSA  Culture, blood (routine x 2)     Status: None (Preliminary result)   Collection Time: 06/10/21  9:02 PM   Specimen: BLOOD  Result Value Ref Range Status   Specimen Description BLOOD LEFT ANTECUBITAL  Final   Special Requests   Final    BOTTLES DRAWN AEROBIC AND ANAEROBIC Blood Culture adequate volume   Culture   Final    NO GROWTH 3 DAYS Performed at [facility], [place] [address], [place], [place] [address]    Report Status PENDING  Incomplete  Culture, blood (routine x 2)     Status: None (Preliminary result)   Collection Time: 06/10/21  9:02 PM   Specimen: BLOOD  Result Value Ref Range Status   Specimen Description BLOOD RIGHT ANTECUBITAL  Final   Special Requests   Final    BOTTLES DRAWN AEROBIC AND ANAEROBIC Blood Culture results may not be optimal due to an inadequate volume of blood received in culture bottles   Culture   Final    NO GROWTH 3 DAYS Performed at [facility], [place] [address], [place], [place] [address]    Report Status PENDING  Incomplete         Radiology Studies: No results found.      Scheduled Meds:  (feeding supplement) PROSource Plus  30 mL Oral BID BM   calcium acetate  1,334 mg Oral TID WC   Chlorhexidine Gluconate Cloth  6 each Topical Q0600   darbepoetin (ARANESP) injection - NON-DIALYSIS  100 mcg Subcutaneous Q Mon-1800   feeding supplement (NEPRO CARB STEADY)  237 mL Oral QHS   folic  acid  1 mg Per Tube Daily   gabapentin  100 mg Oral BID   Gerhardt's butt cream   Topical TID   heparin injection (subcutaneous)  5,000 Units Subcutaneous Q8H   multivitamin  1 tablet Oral QHS   pantoprazole  40 mg Oral BID   Continuous Infusions:  sodium chloride Stopped (05/29/21 1602)   ceFEPime (MAXIPIME) IV 1 g (06/13/21 1553)   [START ON 06/14/2021] DAPTOmycin (CUBICIN)  IV      Assessment & Plan:   Principal Problem:   Cardiac arrest with pulseless electrical activity ([place]) Active Problems:   AKI (acute kidney injury) ([place])   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HCC)   Lower extremity weakness with left foot drop   Thrombocytosis   Hyponatremia   Acute posthemorrhagic anemia   Elevated liver enzymes   Acute respiratory failure with hypoxia and hypercapnia (HCC)   Septic shock due to undetermined organism ([place])   Acute metabolic encephalopathy   Aspiration pneumonia of both lower lobes due to vomit ([place])   Protein-calorie malnutrition, severe   Ischemic enteritis versus colitis with ileus   right anterior knee wound   Mechanical fall 1/24 and hit his head   Cardiac arrest with pulseless electrical activity ([place]) S/p PEA arrest, s/p CPR x 20 minutes and Epi x 5. 2/4 hemodynamics stable now     AKI (acute kidney injury) ([place])- (present on admission) with metabolic acidosis/rhabdomyolysis Patient has been off CRRT 1/15-1/19/2023. Nephrology on board and appreciate recommendations.  Hoping for renal recovery, if no recovery will need a perm cath next week.   Improving CK levels. 2/2 intermittent HD now on MWF schedule. Hold off on permanent access creation since febrile 2/3 will receive IV ct contrast for IR procedure, needs close monitoring. Nephrology aware 2/4 had HD today.   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HCC) Had fever on 2/2 Bcx obtained Cxr possible atelectasis Continue vancomycin 2/4 IR unable to do much drainage     Thrombocytosis Reactive or from infection   Lower extremity weakness  with left foot drop MRI of the neuraxis  was unremarkable for acute process .  As per the MRI of the lumbar spine, multiple fluid collections in the right ileo psoas muscle.  Dr Radene Gunning spoke with neurology and neuro surgery on 1/24, no surgical cause for his lower extremity weakness. He suggested that a psoas abscess or hematoma could cause lumbar plexus compression /irritation leading to secondary weakness.   IR consulted for aspiration, suggested any aspiration would predispose to infection/ not amenable for aspiration or drain placement. Did not advise aspiration.  MRI of the femur shows Diffuse muscular abnormalities throughout the visualized portions of both thighs with heterogeneous T2 signal and enhancement, especially within the left gluteus maximus and quadratus femoris muscles. Findings are compatible with the diagnosis of rhabdomyolysis, with the differential including myositis related to connective tissue disease and polymyositis. No drainable abscess identified. pt unable to move the left lower extremity from severe pain and weakness.  Orthopedics consulted, suggested its a neurological/ lumbar plexus ischemic injury.  Hoping for recovery with therapy evaluations.  CK levels are improving. Vitamin b12 and folate levels wnl. ESR is 85, CRP is 5.4   Pt has good pulse in DP, no sensory deficits. No signs of compartment syndrome.  2/2 continue gabapentin 2/3 CIR pending     Hyponatremia Improving.  Likely 2/2 aki   Acute posthemorrhagic anemia possiblly from ?psoas hematoma Stool fob + on 1/21 , suspected from colitis   Elevated liver enzymes Probably from ETOH abuse, shock liver and rhabdomyolysis.  Much improved   Mechanical fall 1/24 and hit his head -As per nursing report, prior to going to the OR, patient attempted to get out of the bed, fell and hit his head without any obvious external injuries or LOC.  Patient's friend was in the room. Advised nursing regarding strict fall  precautions, placement of safety sitter if needed and monitor closely.   right anterior knee wound wound care consulted   Ischemic enteritis versus colitis with ileus General surgery consulted, there was concerned about an ischemic bowel.  Patient was taken to the OR for diagnostic laparoscopy, surgery revealed bowel in question is thickened,  no ischemia was seen.  hence no further intervention at this time.  Advance diet as tolerated without any issues at this time. Diarrhea improved with imodium.   C. difficile PCR and GI panel negative  2/3 continue to monitor   Protein-calorie malnutrition, severe Supplement was added   Aspiration pneumonia of both lower lobes due to vomit ([place])- (present on admission) Completed course of abx   Acute metabolic encephalopathy- (present on admission) Resolved, at baseline   Septic shock due to undetermined organism [facility])- (present on admission) Vital stable   Acute respiratory failure with hypoxia and hypercapnia ([place])- (present on admission) Patient intubated on 05/24/2021 and extubated on 05/28/2021 .  patient completed course of antibiotics for aspiration pneumonia  2/3 currently on room air       DVT prophylaxis: heparin Code Status:full Family Communication: none Disposition Plan:  Status is: Inpatient Remains inpatient appropriate because: IV treatment. Still febrile, w/u pending                LOS: 20 days   Time spent: 35 min    Nolberto Hanlon, MD [facility] Pager [phone]  If 7PM-7AM, please contact night-coverage 06/13/2021, 6:41 PM

## 2021-06-13 NOTE — Progress Notes (Signed)
[facility]   Reason for visit: Follow up on psoas abscess/hematoma  Interval History: no CT abdomen yet done.  Reportedly not felt to be drainable regardless.  WBC 12; he reports continued feeling of sensitivity of his skin and pain from his pelvis down to his feet.  No dysuria, no pyuria Cefepime added today due to urine culture growth   Physical Exam: Constitutional:  Vitals:   06/13/21 1240 06/13/21 1247  BP: 126/85 128/83  Pulse: (!) 115 (!) 114  Resp:  17  Temp:  99.2 F (37.3 C)  SpO2:  98%   patient appears in NAD Respiratory: Normal respiratory effort; CTA B Cardiovascular: RRR GI: soft MS; no movement of his left leg  Review of Systems: Gastrointestinal: negative for nausea and diarrhea Integument/breast: negative for rash  Lab Results  Component Value Date   WBC 12.0 (H) 06/13/2021   HGB 7.2 (L) 06/13/2021   HCT 22.7 (L) 06/13/2021   MCV 86.3 06/13/2021   PLT 696 (H) 06/13/2021    Lab Results  Component Value Date   CREATININE 8.34 (H) 06/13/2021   BUN 70 (H) 06/13/2021   NA 131 (L) 06/13/2021   K 3.9 06/13/2021   CL 95 (L) 06/13/2021   CO2 22 06/13/2021    Lab Results  Component Value Date   ALT 16 06/08/2021   AST 25 06/08/2021   ALKPHOS 148 (H) 06/08/2021     Microbiology: Recent Results (from the past 240 hour(s))  Culture, blood (routine x 2)     Status: None   Collection Time: 06/07/21 10:14 AM   Specimen: BLOOD  Result Value Ref Range Status   Specimen Description BLOOD RIGHT ANTECUBITAL  Final   Special Requests   Final    BOTTLES DRAWN AEROBIC ONLY Blood Culture adequate volume   Culture   Final    NO GROWTH 5 DAYS Performed at [facility], [address], [place], [place] [address]    Report Status 06/12/2021 FINAL  Final  Culture, blood (routine x 2)     Status: None   Collection Time: 06/07/21 10:17 AM   Specimen: BLOOD RIGHT HAND  Result Value Ref Range Status   Specimen Description  BLOOD RIGHT HAND  Final   Special Requests   Final    BOTTLES DRAWN AEROBIC ONLY Blood Culture adequate volume   Culture   Final    NO GROWTH 5 DAYS Performed at [facility], [place] [address] [place], [place] [address]    Report Status 06/12/2021 FINAL  Final  Gastrointestinal Panel by PCR , Stool     Status: None   Collection Time: 06/07/21  8:38 PM   Specimen: STOOL  Result Value Ref Range Status   Campylobacter species NOT DETECTED NOT DETECTED Final   Plesimonas shigelloides NOT DETECTED NOT DETECTED Final   Salmonella species NOT DETECTED NOT DETECTED Final   Yersinia enterocolitica NOT DETECTED NOT DETECTED Final   Vibrio species NOT DETECTED NOT DETECTED Final   Vibrio cholerae NOT DETECTED NOT DETECTED Final   Enteroaggregative E coli (EAEC) NOT DETECTED NOT DETECTED Final   Enteropathogenic E coli (EPEC) NOT DETECTED NOT DETECTED Final   Enterotoxigenic E coli (ETEC) NOT DETECTED NOT DETECTED Final   Shiga like toxin producing E coli (STEC) NOT DETECTED NOT DETECTED Final   Shigella/Enteroinvasive E coli (EIEC) NOT DETECTED NOT DETECTED Final   Cryptosporidium NOT DETECTED NOT DETECTED Final   Cyclospora cayetanensis NOT DETECTED  NOT DETECTED Final   Entamoeba histolytica NOT DETECTED NOT DETECTED Final   Giardia lamblia NOT DETECTED NOT DETECTED Final   Adenovirus F40/41 NOT DETECTED NOT DETECTED Final   Astrovirus NOT DETECTED NOT DETECTED Final   Norovirus GI/GII NOT DETECTED NOT DETECTED Final   Rotavirus A NOT DETECTED NOT DETECTED Final   Sapovirus (I, II, IV, and V) NOT DETECTED NOT DETECTED Final    Comment: Performed at [facility], [address] [place], [place] [address]  C Difficile Quick Screen (NO PCR Reflex)     Status: None   Collection Time: 06/07/21  8:38 PM   Specimen: STOOL  Result Value Ref Range Status   C Diff antigen NEGATIVE NEGATIVE Final   C Diff toxin NEGATIVE NEGATIVE Final   C Diff interpretation No C. difficile  detected.  Final    Comment: Performed at [facility], [place] [address] [place], [place] [address]  Urine Culture     Status: Abnormal   Collection Time: 06/10/21  6:37 PM   Specimen: Urine, Clean Catch  Result Value Ref Range Status   Specimen Description URINE, CLEAN CATCH  Final   Special Requests   Final    NONE Performed at [facility], [place] [address], [place], [place] [address]    Culture 20,000 COLONIES/mL PSEUDOMONAS AERUGINOSA (A)  Final   Report Status 06/13/2021 FINAL  Final   Organism ID, Bacteria PSEUDOMONAS AERUGINOSA (A)  Final      Susceptibility   Pseudomonas aeruginosa - MIC*    CEFTAZIDIME 4 SENSITIVE Sensitive     CIPROFLOXACIN <=0.25 SENSITIVE Sensitive     GENTAMICIN <=1 SENSITIVE Sensitive     IMIPENEM 1 SENSITIVE Sensitive     PIP/TAZO 8 SENSITIVE Sensitive     CEFEPIME 2 SENSITIVE Sensitive     * 20,000 COLONIES/mL PSEUDOMONAS AERUGINOSA  Culture, blood (routine x 2)     Status: None (Preliminary result)   Collection Time: 06/10/21  9:02 PM   Specimen: BLOOD  Result Value Ref Range Status   Specimen Description BLOOD LEFT ANTECUBITAL  Final   Special Requests   Final    BOTTLES DRAWN AEROBIC AND ANAEROBIC Blood Culture adequate volume   Culture   Final    NO GROWTH 3 DAYS Performed at [facility], [place] [address], [place], [place] [address]    Report Status PENDING  Incomplete  Culture, blood (routine x 2)     Status: None (Preliminary result)   Collection Time: 06/10/21  9:02 PM   Specimen: BLOOD  Result Value Ref Range Status   Specimen Description BLOOD RIGHT ANTECUBITAL  Final   Special Requests   Final    BOTTLES DRAWN AEROBIC AND ANAEROBIC Blood Culture results may not be optimal due to an inadequate volume of blood received in culture bottles   Culture   Final    NO GROWTH 3 DAYS Performed at [facility], [place] [address] [place],  [address]    Report Status PENDING  Incomplete    Impression/Plan:  1.  Psoas abscess - No drainage yet attempted.  On daptomycin for broad gram positive coverage.   At this point I agree with the addition of cefepime to broaden coverage with ongoing probable Psoas abscess and continued fever.    2.  Positive urine culture - he is asymptomatic from a urinary standpoint and no indication for treatment of his minimally positive urine culture.    3.  Medication monitoring - CD trended down and 20most recently.  Will continue to monitor on daptomycin.

## 2021-06-13 NOTE — Progress Notes (Signed)
Notified on call MD that patient temp was elevated, MEWS score turned to red. Patient was previously Red on MEWS. MD placed order for Tylenol.

## 2021-06-13 NOTE — Progress Notes (Incomplete)
Progress Note   Patient: Andres Braun WJX:[id number] DOB: 1975/05/25 DOA: 05/24/2021     20 DOS: the patient was seen and examined on 06/13/2021   Brief hospital course: 46 year old male admitted to ICU 1/15 under PCCM care after cardiac arrest at home.  PEA arrest, s/p CPR x20 minutes and epi x5.  Patient reportedly out drinking with his friends the night prior to admission.  Found by friends with agonal breathing.  Intubated on arrival, needed vasopressors, noted to have renal failure, rhabdomyolysis with CK in the 14 K range, treated for aspiration pneumonia, nephrology consulted and started on CRRT.  Extubated 1/19 and off pressors.  Mental status slowly improved.  Transferred to telemetry.  Care transferred to [facility] on 1/24.  Nephrology following for HD needs.  General surgery on board for questionable small bowel ischemia and taken emergently to surgery 1/24. The bowel in question was found to be thickened, but viable. Hospital course complicated by unexplained left lower extremity weakness. Extensive work up of the neuraxis is negative for compressive pathology. Differential is probably a psoas hematoma causing lumbar plexus compression/ irritation leading to secondary weakness. IR suggested that it is not amenable for aspiration or drain placement. Neurology consulted, suggested not a neurological injury as the neuraxis is negative.    2/2 feels little better this am. No new complaints. No overnight issues. Tmax  101.7.  2/3 still with b/lLE pain. Spoke to IR and nephrology as pt needs IV ct contrast for IR drainage.   Assessment and Plan: * Cardiac arrest with pulseless electrical activity (HCC) S/p PEA arrest, s/p CPR x 20 minutes and Epi x 5.   AKI (acute kidney injury) ([place])- (present on admission) with metabolic acidosis/rhabdomyolysis Patient has been off CRRT 1/15-1/19/2023. Nephrology on board and appreciate recommendations.  Hoping for renal recovery, if no recovery will need a  perm cath next week.   Improving CK levels. 2/2 intermittent HD now on MWF schedule. Hold off on permanent access creation since febrile 2/3 will receive IV ct contrast for IR procedure, needs close monitoring. Nephrology aware  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[place]) Had fever on 2/2 Bcx obtained Cxr possible atelectasis Continue vancomycin 2/3 will go for IR drainage  Thrombocytosis Reactive or from infection  Lower extremity weakness with left foot drop MRI of the neuraxis  was unremarkable for acute process .  As per the MRI of the lumbar spine, multiple fluid collections in the right ileo psoas muscle.  Dr Radene Gunning spoke with neurology and neuro surgery on 1/24, no surgical cause for his lower extremity weakness. He suggested that a psoas abscess or hematoma could cause lumbar plexus compression /irritation leading to secondary weakness.   IR consulted for aspiration, suggested any aspiration would predispose to infection/ not amenable for aspiration or drain placement. Did not advise aspiration.  MRI of the femur shows Diffuse muscular abnormalities throughout the visualized portions of both thighs with heterogeneous T2 signal and enhancement, especially within the left gluteus maximus and quadratus femoris muscles. Findings are compatible with the diagnosis of rhabdomyolysis, with the differential including myositis related to connective tissue disease and polymyositis. No drainable abscess identified. pt unable to move the left lower extremity from severe pain and weakness.  Orthopedics consulted, suggested its a neurological/ lumbar plexus ischemic injury.  Hoping for recovery with therapy evaluations.  CK levels are improving. Vitamin b12 and folate levels wnl. ESR is 85, CRP is 5.4   Pt has good pulse in DP, no sensory deficits. No signs of  compartment syndrome.  2/2 continue gabapentin 2/3 CIR pending    Hyponatremia Improving.  Likely 2/2 aki  Acute posthemorrhagic anemia possiblly from  ?psoas hematoma Stool fob + on 1/21 , suspected from colitis  Elevated liver enzymes Probably from ETOH abuse, shock liver and rhabdomyolysis.  Much improved  Mechanical fall 1/24 and hit his head -As per nursing report, prior to going to the OR, patient attempted to get out of the bed, fell and hit his head without any obvious external injuries or LOC.  Patient's friend was in the room. Advised nursing regarding strict fall precautions, placement of safety sitter if needed and monitor closely.  right anterior knee wound wound care consulted  Ischemic enteritis versus colitis with ileus General surgery consulted, there was concerned about an ischemic bowel.  Patient was taken to the OR for diagnostic laparoscopy, surgery revealed bowel in question is thickened,  no ischemia was seen.  hence no further intervention at this time.  Advance diet as tolerated without any issues at this time. Diarrhea improved with imodium.   C. difficile PCR and GI panel negative  2/3 continue to monitor  Protein-calorie malnutrition, severe Supplement was added  Aspiration pneumonia of both lower lobes due to vomit ([place])- (present on admission) Completed course of abx  Acute metabolic encephalopathy- (present on admission) Resolved, at baseline  Septic shock due to undetermined organism [facility])- (present on admission) Vital stable  Acute respiratory failure with hypoxia and hypercapnia ([place])- (present on admission) Patient intubated on 05/24/2021 and extubated on 05/28/2021 .  patient completed course of antibiotics for aspiration pneumonia  2/3 currently on room air      {Tip this will not be part of the note when signed Body mass index is 25.95 kg/m. ,  Nutrition Documentation    Flowsheet Row ED to Hosp-Admission (Current) from 05/24/2021 in [facility]  Nutrition Problem Severe Malnutrition  Etiology acute illness  [cardiac arrest, AKI, ileus]  Nutrition Goal Patient  will meet greater than or equal to 90% of their needs  Interventions Ensure Enlive (each supplement provides 350kcal and 20 grams of protein), Hormel Shake, MVI     ,  (Optional):26781}  Subjective: ***  Physical Exam: Vitals:   06/12/21 2100 06/13/21 0131 06/13/21 0355 06/13/21 0506  BP: (!) 160/97 (!) 154/92  124/73  Pulse: (!) 120 (!) 124  (!) 118  Resp: 18 18  18   Temp: 99.8 F (37.7 C) (!) 102.1 F (38.9 C) 98.9 F (37.2 C) 99 F (37.2 C)  TempSrc: Oral Oral Oral Oral  SpO2:  97%  99%  Weight:    86.8 kg  Height:       ***  Data Reviewed: {Tip this will not be part of the note when signed- Document your independent interpretation of telemetry tracing, EKG, lab, Radiology test or any other diagnostic tests. Add any new diagnostic test ordered today. (Optional):26781} {Results:26384}  Family Communication: ***  Disposition: Status is: Inpatient {Inpatient:23812}          Planned Discharge Destination: {DISCHARGE DESTINATION_TRH:27031}  {Tip this will not be part of the note when signed  DVT Prophylaxis  ., Heparin injection 5,000 units       (Optional):26781}   Time spent: *** minutes  Author: Nolberto Hanlon, MD 06/13/2021 8:15 AM  For on call review [email or web address].

## 2021-06-13 NOTE — Progress Notes (Signed)
°   06/13/21 1247  Vitals  Temp 99.2 F (37.3 C)  Temp Source Oral  BP 128/83  BP Location Right Arm  BP Method Automatic  Patient Position (if appropriate) Sitting  Pulse Rate (!) 114  Pulse Rate Source Monitor  Resp 17  Oxygen Therapy  SpO2 98 %  O2 Device Room Air  Post-Hemodialysis Assessment  Rinseback Volume (mL) 250 mL  KECN 281 V  Dialyzer Clearance Lightly streaked  Duration of HD Treatment -hour(s) 3.5 hour(s)  Hemodialysis Intake (mL) 500 mL  UF Total -Machine (mL) 500 mL  Net UF (mL) 0 mL  Tolerated HD Treatment Yes  Post-Hemodialysis Comments tx complete, pt stable  Hemodialysis Catheter Right Internal jugular Double lumen Temporary (Non-Tunneled)  Placement Date/Time: 06/01/21 1514   Placed prior to admission: No  Time Out: Correct patient;Correct site;Correct procedure  Maximum sterile barrier precautions: Hand hygiene;Cap;Mask;Sterile gown;Sterile gloves;Large sterile sheet  Site Prep: Chlorh...  Site Condition No complications  Blue Lumen Status Flushed;Heparin locked;Dead end cap in place  Red Lumen Status Flushed;Dead end cap in place;Heparin locked  Catheter fill solution Heparin 1000 units/ml  Catheter fill volume (Arterial) 1.2 cc  Catheter fill volume (Venous) 1.2  Dressing Type Transparent  Dressing Status Clean;Dry;Intact  Antimicrobial disc in place? Yes  Drainage Description None  Dressing Change Due 06/15/21  Post treatment catheter status Capped and Clamped   HD tx complete, no problems noted throughout tx. Pt given oxycodone 10 mg x1, to be reassessed on unit.

## 2021-06-13 NOTE — Progress Notes (Signed)
[facility] Progress Note   Subjective:   Patient fairly febrile overnight.  Urine culture positive for Pseudomonas.  Urine output excellent but creatinine continues to rise slightly.  Patient denies specific complaints today.  Objective Vitals:   06/13/21 0902 06/13/21 0909 06/13/21 0930 06/13/21 1000  BP: 120/80 118/79 120/82 121/85  Pulse: (!) 108 (!) 108 (!) 115 (!) 115  Resp:      Temp:      TempSrc:      SpO2:      Weight:      Height:       Physical Exam General: Lying in bed, no distress Heart: Tachycardic, 1+ pitting edema in the bilateral lower extremities Lungs: Bilateral chest rise with no increased work of breathing Abdomen: soft, mildly distended Extremities: Warm and well perfused Neuro:  awake and alert, oriented to person and place Dialysis Access:  R IJ  temp HD catheter -  placed 1/23  Additional Objective Labs: Basic Metabolic Panel: Recent Labs  Lab 06/11/21 0145 06/12/21 0450 06/13/21 0401  NA 132* 131* 131*  K 4.1 4.7 3.9  CL 95* 97* 95*  CO2 26 23 22   GLUCOSE 118* 105* 99  BUN 37* 55* 70*  CREATININE 6.01* 7.86* 8.34*  CALCIUM 7.8* 8.0* 8.1*  PHOS 5.9* 7.0* 7.3*   Liver Function Tests: Recent Labs  Lab 06/08/21 1022 06/09/21 0359 06/11/21 0145 06/12/21 0450 06/13/21 0401  AST 25  --   --   --   --   ALT 16  --   --   --   --   ALKPHOS 148*  --   --   --   --   BILITOT 0.2*  --   --   --   --   PROT 5.3*  --   --   --   --   ALBUMIN 1.8*   1.6*   < > 1.5* 1.6* 1.5*   < > = values in this interval not displayed.   No results for input(s): LIPASE, AMYLASE in the last 168 hours. CBC: Recent Labs  Lab 06/08/21 1022 06/09/21 0359 06/09/21 1950 06/10/21 0405 06/11/21 0145 06/13/21 0830  WBC 21.8* 19.5*  --  17.3* 13.1* 12.0*  NEUTROABS 18.5* 15.9*  --  14.0*  --   --   HGB 7.3* 6.8*   < > 7.4* 7.4* 7.2*  HCT 22.0* 20.4*   < > 22.6* 23.5* 22.7*  MCV 85.3 84.6  --  85.3 88.3 86.3  PLT 784* 700*  --  667* 693*  696*   < > = values in this interval not displayed.   Blood Culture    Component Value Date/Time   SDES BLOOD LEFT ANTECUBITAL 06/10/2021 2102   SDES BLOOD RIGHT ANTECUBITAL 06/10/2021 2102   SPECREQUEST  06/10/2021 2102    BOTTLES DRAWN AEROBIC AND ANAEROBIC Blood Culture adequate volume   SPECREQUEST  06/10/2021 2102    BOTTLES DRAWN AEROBIC AND ANAEROBIC Blood Culture results may not be optimal due to an inadequate volume of blood received in culture bottles   CULT  06/10/2021 2102    NO GROWTH 2 DAYS Performed at [facility], [address] [place], [place]    CULT  06/10/2021 2102    NO GROWTH 2 DAYS Performed at [facility], [address], [place], [place]    REPTSTATUS PENDING 06/10/2021 2102   REPTSTATUS PENDING 06/10/2021 2102    Cardiac Enzymes: Recent Labs  Lab 06/11/21 0145  CKTOTAL 131   CBG: No results for input(s): GLUCAP in the last 168 hours.  Iron Studies:  No results for input(s): IRON, TIBC, TRANSFERRIN, FERRITIN in the last 72 hours.   @lablastinr3 @ Studies/Results: No results found. Medications:  sodium chloride Stopped (05/29/21 1602)   ceFEPime (MAXIPIME) IV     [START ON 06/14/2021] DAPTOmycin (CUBICIN)  IV      (feeding supplement) PROSource Plus  30 mL Oral BID BM   calcium acetate  1,334 mg Oral TID WC   Chlorhexidine Gluconate Cloth  6 each Topical Q0600   darbepoetin (ARANESP) injection - NON-DIALYSIS  100 mcg Subcutaneous Q Mon-1800   feeding supplement (NEPRO CARB STEADY)  237 mL Oral QHS   folic acid  1 mg Per Tube Daily   gabapentin  100 mg Oral BID   Gerhardt's butt cream   Topical TID   heparin injection (subcutaneous)  5,000 Units Subcutaneous Q8H   multivitamin  1 tablet Oral QHS   pantoprazole  40 mg Oral BID    Assessment/ Plan: AKI severe: Baseline creatinine possibly around 2.7.  In setting of cardiac arrest, rhabdo.  Oliguric to anuric.  Required CRRT 1/15 - 1/19.   intermittent  hemodialysis now on MWF schedule.  Definitive baseline for his creatinine is unknown but likely had some CKD prior to all of this.  We started discussions regarding TDC and permanent access creation but because of ongoing fevers the [facility] has been delayed and the patient is hesitant to undergo AVF  Urine output has significantly increased over the past several days.  Creatinine continues to rise between dialysis.  I am hopeful he has some chance of renal recovery given his urine output is picked up so much.  However, he may have to receive IV contrast which could hinder his improvement.  Unable to CLIP as AKI due to no insurance.  We will continue to reevaluate his qualifications of his ESRD routinely.  Plan for dialysis today and then watch for signs of renal recovery  SP cardiac arrest - asystolic arrest in setting of prob asp PNA/ ARDS in setting of EtOH LE neurologic deficits - ortho and neuro as well as NSG seeing-  thought is fluid in psoas muscle could be causing some compression but hard to say Possible ischemic bowel - surgery consulting-  ex lap this past week did not show ischemic bowel needing resection but with some edema in bowel Psoas abscess/fevers: ID following.  Possibly abscesses not amenable to drainage.  Ongoing fevers at this time.  Urine culture positive for Pseudomonas.  Will add cefepime daily renally dosed.  Defer further management and work-up per primary and infectious disease Anemia- s/p transfusion on 1/31. No iron for now. Continue ESA. Bones-  phos has improved with binders   2/31  06/13/2021, 10:20 AM  08/11/2021

## 2021-06-14 LAB — RENAL FUNCTION PANEL
Albumin: <1.5 g/dL — ABNORMAL LOW (ref 3.5–5.0)
Anion gap: 12 (ref 5–15)
BUN: 40 mg/dL — ABNORMAL HIGH (ref 6–20)
CO2: 25 mmol/L (ref 22–32)
Calcium: 7.9 mg/dL — ABNORMAL LOW (ref 8.9–10.3)
Chloride: 99 mmol/L (ref 98–111)
Creatinine, Ser: 4.94 mg/dL — ABNORMAL HIGH (ref 0.61–1.24)
GFR, Estimated: 14 mL/min — ABNORMAL LOW (ref >60)
Glucose, Bld: 134 mg/dL — ABNORMAL HIGH (ref 70–99)
Phosphorus: 4.1 mg/dL (ref 2.5–4.6)
Potassium: 3.5 mmol/L (ref 3.5–5.1)
Sodium: 136 mmol/L (ref 135–145)

## 2021-06-14 LAB — CBC
HCT: 22.2 % — ABNORMAL LOW (ref 39.0–52.0)
Hemoglobin: 6.7 g/dL — CL (ref 13.0–17.0)
MCH: 26.7 pg (ref 26.0–34.0)
MCHC: 30.2 g/dL (ref 30.0–36.0)
MCV: 88.4 fL (ref 80.0–100.0)
Platelets: 637 10*3/uL — ABNORMAL HIGH (ref 150–400)
RBC: 2.51 MIL/uL — ABNORMAL LOW (ref 4.22–5.81)
RDW: 15 % (ref 11.5–15.5)
WBC: 10 10*3/uL (ref 4.0–10.5)
nRBC: 0 % (ref 0.0–0.2)

## 2021-06-14 LAB — RESP PANEL BY RT-PCR (FLU A&B, COVID) ARPGX2
Influenza A by PCR: NEGATIVE
Influenza B by PCR: NEGATIVE
SARS Coronavirus 2 by RT PCR: NEGATIVE

## 2021-06-14 LAB — PREPARE RBC (CROSSMATCH)

## 2021-06-14 MED ORDER — SODIUM CHLORIDE 0.9% IV SOLUTION: Freq: Once | INTRAVENOUS | Status: AC

## 2021-06-14 MED ORDER — ACETAMINOPHEN 325 MG PO TABS
650.0000 mg | ORAL_TABLET | Freq: Once | ORAL | Status: AC
  Administered 2021-06-14: 650 mg via ORAL
  Filled 2021-06-14: qty 2

## 2021-06-14 MED ORDER — ACETAMINOPHEN 325 MG PO TABS: 650.0000 mg | ORAL_TABLET | Freq: Once | ORAL | Status: AC

## 2021-06-14 NOTE — Progress Notes (Signed)
Inpatient Rehab Admissions Coordinator:   I have been working for several weeks to identify a disposition for this pt. And have not been able to identify a disposition as the friend he lives with and all of his contacts work. I cannot offer a CIR bed due to lack of safe disposition. Pt. Will likely need SNF. I will sign off and notify TOC; however, if a disposition is identified, TOC or MD may re-consult CIR.   Megan Salon, MS, CCC-SLP Rehab Admissions Coordinator  [phone] (celll) [phone] (office)

## 2021-06-14 NOTE — Progress Notes (Signed)
The patient is injury-free, afebrile, alert, and oriented X 3. He continued to have tachycardia during shift (MD was notified). The rest of vital signs were within the baseline during this shift. He complained of continuous LLE pain, which was controlled with current pain regimen. He denies chest pain, SOB, nausea, vomiting, dizziness, signs or symptoms of bleeding or infection, or acute changes during this shift. We will continue to monitor and work toward achieving the care plan goals.

## 2021-06-14 NOTE — Progress Notes (Signed)
[facility] Progress Note   Subjective:   Fever again improving.  Patient feels well today only bothered by lower extremity edema.  Tolerated dialysis yesterday.  1.4 L of urine made yesterday.  Did receive contrast on 2/4.  Psoas hematoma not amenable to aspiration  Objective Vitals:   06/13/21 2148 06/14/21 0246 06/14/21 0536 06/14/21 1006  BP: 138/87 133/84 (!) 142/82 126/79  Pulse: (!) 113 (!) 124 (!) 120 (!) 112  Resp: 19 18 18 18   Temp: 99.7 F (37.6 C) 97.9 F (36.6 C) 98 F (36.7 C) 98.6 F (37 C)  TempSrc: Oral Oral Oral   SpO2: 100% 99% 100% 98%  Weight:   83.2 kg   Height:       Physical Exam General: Lying in bed, no distress Heart: Tachycardic, 1+ pitting edema in the bilateral lower extremities Lungs: Bilateral chest rise with no increased work of breathing Abdomen: soft, no distention Extremities: Warm and well perfused Neuro:  awake and alert, oriented to person and place Dialysis Access:  R IJ  temp HD catheter -  placed 1/23  Additional Objective Labs: Basic Metabolic Panel: Recent Labs  Lab 06/12/21 0450 06/13/21 0401 06/14/21 0048  NA 131* 131* 136  K 4.7 3.9 3.5  CL 97* 95* 99  CO2 23 22 25   GLUCOSE 105* 99 134*  BUN 55* 70* 40*  CREATININE 7.86* 8.34* 4.94*  CALCIUM 8.0* 8.1* 7.9*  PHOS 7.0* 7.3* 4.1   Liver Function Tests: Recent Labs  Lab 06/08/21 1022 06/09/21 0359 06/12/21 0450 06/13/21 0401 06/14/21 0048  AST 25  --   --   --   --   ALT 16  --   --   --   --   ALKPHOS 148*  --   --   --   --   BILITOT 0.2*  --   --   --   --   PROT 5.3*  --   --   --   --   ALBUMIN 1.8*   1.6*   < > 1.6* 1.5* <1.5*   < > = values in this interval not displayed.   No results for input(s): LIPASE, AMYLASE in the last 168 hours. CBC: Recent Labs  Lab 06/08/21 1022 06/09/21 0359 06/09/21 1950 06/10/21 0405 06/11/21 0145 06/13/21 0830  WBC 21.8* 19.5*  --  17.3* 13.1* 12.0*  NEUTROABS 18.5* 15.9*  --  14.0*  --   --    HGB 7.3* 6.8*   < > 7.4* 7.4* 7.2*  HCT 22.0* 20.4*   < > 22.6* 23.5* 22.7*  MCV 85.3 84.6  --  85.3 88.3 86.3  PLT 784* 700*  --  667* 693* 696*   < > = values in this interval not displayed.   Blood Culture    Component Value Date/Time   SDES BLOOD LEFT ANTECUBITAL 06/10/2021 2102   SDES BLOOD RIGHT ANTECUBITAL 06/10/2021 2102   SPECREQUEST  06/10/2021 2102    BOTTLES DRAWN AEROBIC AND ANAEROBIC Blood Culture adequate volume   SPECREQUEST  06/10/2021 2102    BOTTLES DRAWN AEROBIC AND ANAEROBIC Blood Culture results may not be optimal due to an inadequate volume of blood received in culture bottles   CULT  06/10/2021 2102    NO GROWTH 3 DAYS Performed at [facility], [address] [place], [place] [address]    CULT  06/10/2021 2102    NO GROWTH 3 DAYS Performed at [facility], [address] [place], [place] [address]    REPTSTATUS PENDING 06/10/2021 2102   REPTSTATUS PENDING 06/10/2021 2102    Cardiac Enzymes: Recent Labs  Lab 06/11/21 0145  CKTOTAL 131   CBG: No results for input(s): GLUCAP in the last 168 hours.  Iron Studies:  No results for input(s): IRON, TIBC, TRANSFERRIN, FERRITIN in the last 72 hours.   @lablastinr3 @ Studies/Results: CT PELVIS W CONTRAST  Result Date: 06/13/2021 CLINICAL DATA:  Psoas abscess EXAM: CT PELVIS WITH CONTRAST TECHNIQUE: Multidetector CT imaging of the pelvis was performed using the standard protocol following the bolus administration of intravenous contrast. RADIATION DOSE REDUCTION: This exam was performed according to the departmental dose-optimization program which includes automated exposure control, adjustment of the mA and/or kV according to patient size and/or use of iterative reconstruction technique. CONTRAST:  98mL OMNIPAQUE IOHEXOL 300 MG/ML  SOLN COMPARISON:  CT 06/01/2021, MRI 05/31/2021 FINDINGS: Urinary Tract:  No abnormality visualized. Bowel: Mild diverticulosis of the distal descending and proximal  sigmoid colon. Moderate stool within the distal colon without evidence of obstruction. Previously noted circumferential thickening of the terminal ileum appears improved on the current examination. No evidence of focal inflammation. Appendix normal. No ascites. Vascular/Lymphatic: No pathologically enlarged lymph nodes. No significant vascular abnormality seen. Reproductive:  The prostate gland is unremarkable. Other: Diffuse subcutaneous body wall edema extending into the visualized upper extremities bilaterally has progressed slightly in the interval since prior examination. Trace retroperitoneal edema has also developed within the retroperitoneum surrounding the psoas bilaterally, within the space of Retzius, and within the presacral space. Heterogeneous attenuation and subtle thickening of the right iliopsoas musculature is again identified and appears stable since prior examination. 3 cm targetoid lesion within the right psoas at axial image # 43/3 is in compatible with the previously identified intramuscular hematoma and appears decreased in size since prior examination. No loculated retroperitoneal fluid collection identified. Musculoskeletal: No suspicious bone lesions identified. IMPRESSION: Interval decrease in size in intramuscular hematoma within the right psoas with stable associated minimal asymmetric muscular thickening. No loculated retroperitoneal fluid collections identified. Progressive anasarca. Improved circumferential bowel wall thickening involving the terminal ileum and cecum. Mild distal colonic diverticulosis without superimposed focal inflammatory change. Electronically Signed   By: Fidela Salisbury M.D.   On: 06/13/2021 19:39   Medications:  sodium chloride Stopped (05/29/21 1602)   ceFEPime (MAXIPIME) IV 1 g (06/14/21 1000)   DAPTOmycin (CUBICIN)  IV      (feeding supplement) PROSource Plus  30 mL Oral BID BM   calcium acetate  1,334 mg Oral TID WC   Chlorhexidine Gluconate Cloth   6 each Topical Q0600   darbepoetin (ARANESP) injection - NON-DIALYSIS  100 mcg Subcutaneous Q Mon-1800   feeding supplement (NEPRO CARB STEADY)  237 mL Oral QHS   folic acid  1 mg Per Tube Daily   gabapentin  100 mg Oral BID   Gerhardt's butt cream   Topical TID   heparin injection (subcutaneous)  5,000 Units Subcutaneous Q8H   multivitamin  1 tablet Oral QHS   pantoprazole  40 mg Oral BID    Assessment/ Plan: AKI severe: Baseline creatinine possibly around 2.7.  In setting of cardiac arrest, rhabdo.  Oliguric to anuric.  Required CRRT 1/15 - 1/19.   intermittent hemodialysis now on MWF schedule.  Definitive baseline for his creatinine is unknown but likely had some CKD prior to all of this.  We started discussions regarding TDC and permanent access creation but because of ongoing fevers the  TDC has been delayed and the patient is hesitant to undergo AVF  Urine output has significantly increased over the past week.  Creatinine continues to rise between dialysis.  I am hopeful he has some chance of renal recovery given his urine output is picked up so much.  However, contrast received on 2/4 may hinder improvement  Unable to CLIP as AKI due to no insurance.  We will continue to reevaluate his qualifications of his ESRD routinely.  Last HD on 2/4. Monitor for signs of renal recovery for next couple days off HD.  SP cardiac arrest - asystolic arrest in setting of prob asp PNA/ ARDS in setting of EtOH LE neurologic deficits - ortho and neuro as well as NSG seeing-  thought is fluid in psoas muscle could be causing some compression but hard to say Possible ischemic bowel - surgery consulting-  ex lap this past week did not show ischemic bowel needing resection but with some edema in bowel Psoas hematoma/fevers: ID following.  Possible abscess not amenable to drainage.  Ongoing fevers at this time.  Urine culture positive for Pseudomonas so added cefepime.  Defer further management and work-up per  primary and infectious disease Anemia- s/p transfusion on 1/31. No iron for now. Continue ESA. Bones-  phos has improved with binders   Reesa Chew  06/14/2021, 10:36 AM  [facility]

## 2021-06-14 NOTE — Progress Notes (Signed)
Pt continues to be in the yellow MEWS due to tachycardia. The hospitalist Dr. Toniann Fail was notified of HR 124/min.

## 2021-06-14 NOTE — Plan of Care (Signed)
°  Problem: Education: Goal: Knowledge of General Education information will improve Description: Including pain rating scale, medication(s)/side effects and non-pharmacologic comfort measures Outcome: Progressing   Problem: Health Behavior/Discharge Planning: Goal: Ability to manage health-related needs will improve Outcome: Progressing   Problem: Clinical Measurements: Goal: Respiratory complications will improve Outcome: Progressing   Problem: Clinical Measurements: Goal: Cardiovascular complication will be avoided Outcome: Progressing   Problem: Nutrition: Goal: Adequate nutrition will be maintained Outcome: Progressing   Problem: Elimination: Goal: Will not experience complications related to bowel motility Outcome: Progressing Goal: Will not experience complications related to urinary retention Outcome: Progressing   Problem: Pain Managment: Goal: General experience of comfort will improve Outcome: Progressing   Problem: Safety: Goal: Ability to remain free from injury will improve Outcome: Progressing   Problem: Skin Integrity: Goal: Risk for impaired skin integrity will decrease Outcome: Progressing

## 2021-06-14 NOTE — Progress Notes (Signed)
PROGRESS NOTE    Andres Braun  MBT:[id number] DOB: [birth year] DOA: 05/24/2021 PCP: Merryl Hacker, No    Brief Narrative:  46 year old male admitted to ICU 1/15 under PCCM care after cardiac arrest at home.  PEA arrest, s/p CPR x20 minutes and epi x5.  Patient reportedly out drinking with his friends the night prior to admission.  Found by friends with agonal breathing.  Intubated on arrival, needed vasopressors, noted to have renal failure, rhabdomyolysis with CK in the 14 K range, treated for aspiration pneumonia, nephrology consulted and started on CRRT.  Extubated 1/19 and off pressors.  Mental status slowly improved.  Transferred to telemetry.  Care transferred to [facility] on 1/24.  Nephrology following for HD needs.  General surgery on board for questionable small bowel ischemia and taken emergently to surgery 1/24. The bowel in question was found to be thickened, but viable. Hospital course complicated by unexplained left lower extremity weakness. Extensive work up of the neuraxis is negative for compressive pathology. Differential is probably a psoas hematoma causing lumbar plexus compression/ irritation leading to secondary weakness. IR suggested that it is not amenable for aspiration or drain placement. Neurology consulted, suggested not a neurological injury as the neuraxis is negative.    2/2 feels little better this am. No new complaints. No overnight issues. Tmax  101.7.  2/3 still with b/lLE pain. Spoke to IR and nephrology as pt needs IV ct contrast for IR drainage.  2/4 had HD today 2/5 complaining of left leg pain again.  Able to move his right foot more today per patient as he is showing me.  Left foot still difficulty moving it.ovennight sinus tachycardic.      Consultants:  Nephrology , ID  Procedures:   Antimicrobials:     Subjective: No shortness of breath, chest pain, or abdominal pain.  Objective: Vitals:   06/13/21 1734 06/13/21 2148 06/14/21 0246 06/14/21 0536  BP:  121/86 138/87 133/84 (!) 142/82  Pulse: (!) 116 (!) 113 (!) 124 (!) 120  Resp: 18 19 18 18   Temp:  99.7 F (37.6 C) 97.9 F (36.6 C) 98 F (36.7 C)  TempSrc:  Oral Oral Oral  SpO2: 99% 100% 99% 100%  Weight:    83.2 kg  Height:        Intake/Output Summary (Last 24 hours) at 06/14/2021 1001 Last data filed at 06/14/2021 0537 Gross per 24 hour  Intake 220 ml  Output 1400 ml  Net -1180 ml   Filed Weights   06/10/21 1124 06/13/21 0506 06/14/21 0536  Weight: 85.5 kg 86.8 kg 83.2 kg    Examination:  Calm, NAD Cta no w/r Reg s1/s2 no gallop Soft benign +bs Mild edema . Rt inner shin dressing in place Aaoxox3  Mood and affect appropriate in current setting    Data Reviewed: I have personally reviewed following labs and imaging studies  CBC: Recent Labs  Lab 06/08/21 1022 06/09/21 0359 06/09/21 1950 06/10/21 0405 06/11/21 0145 06/13/21 0830  WBC 21.8* 19.5*  --  17.3* 13.1* 12.0*  NEUTROABS 18.5* 15.9*  --  14.0*  --   --   HGB 7.3* 6.8* 7.7* 7.4* 7.4* 7.2*  HCT 22.0* 20.4* 23.6* 22.6* 23.5* 22.7*  MCV 85.3 84.6  --  85.3 88.3 86.3  PLT 784* 700*  --  667* 693* 536*   Basic Metabolic Panel: Recent Labs  Lab 06/10/21 0405 06/11/21 0145 06/12/21 0450 06/13/21 0401 06/14/21 0048  NA 129* 132* 131* 131* 136  K 4.9  4.1 4.7 3.9 3.5  CL 93* 95* 97* 95* 99  CO2 22 26 23 22 25   GLUCOSE 106* 118* 105* 99 134*  BUN 61* 37* 55* 70* 40*  CREATININE 8.82* 6.01* 7.86* 8.34* 4.94*  CALCIUM 7.9* 7.8* 8.0* 8.1* 7.9*  PHOS 8.3* 5.9* 7.0* 7.3* 4.1   GFR: Estimated Creatinine Clearance: 20.7 mL/min (A) (by C-G formula based on SCr of 4.94 mg/dL (H)). Liver Function Tests: Recent Labs  Lab 06/08/21 1022 06/09/21 0359 06/10/21 0405 06/11/21 0145 06/12/21 0450 06/13/21 0401 06/14/21 0048  AST 25  --   --   --   --   --   --   ALT 16  --   --   --   --   --   --   ALKPHOS 148*  --   --   --   --   --   --   BILITOT 0.2*  --   --   --   --   --   --   PROT 5.3*  --    --   --   --   --   --   ALBUMIN 1.8*   1.6*   < > 1.6* 1.5* 1.6* 1.5* <1.5*   < > = values in this interval not displayed.   No results for input(s): LIPASE, AMYLASE in the last 168 hours. No results for input(s): AMMONIA in the last 168 hours. Coagulation Profile: No results for input(s): INR, PROTIME in the last 168 hours. Cardiac Enzymes: Recent Labs  Lab 06/11/21 0145  CKTOTAL 131   BNP (last 3 results) No results for input(s): PROBNP in the last 8760 hours. HbA1C: No results for input(s): HGBA1C in the last 72 hours. CBG: No results for input(s): GLUCAP in the last 168 hours. Lipid Profile: No results for input(s): CHOL, HDL, LDLCALC, TRIG, CHOLHDL, LDLDIRECT in the last 72 hours. Thyroid Function Tests: No results for input(s): TSH, T4TOTAL, FREET4, T3FREE, THYROIDAB in the last 72 hours. Anemia Panel: No results for input(s): VITAMINB12, FOLATE, FERRITIN, TIBC, IRON, RETICCTPCT in the last 72 hours. Sepsis Labs: Recent Labs  Lab 06/08/21 1022  PROCALCITON 0.80    Recent Results (from the past 240 hour(s))  Culture, blood (routine x 2)     Status: None   Collection Time: 06/07/21 10:14 AM   Specimen: BLOOD  Result Value Ref Range Status   Specimen Description BLOOD RIGHT ANTECUBITAL  Final   Special Requests   Final    BOTTLES DRAWN AEROBIC ONLY Blood Culture adequate volume   Culture   Final    NO GROWTH 5 DAYS Performed at [facility], [address] [place], [place] [address]    Report Status 06/12/2021 FINAL  Final  Culture, blood (routine x 2)     Status: None   Collection Time: 06/07/21 10:17 AM   Specimen: BLOOD RIGHT HAND  Result Value Ref Range Status   Specimen Description BLOOD RIGHT HAND  Final   Special Requests   Final    BOTTLES DRAWN AEROBIC ONLY Blood Culture adequate volume   Culture   Final    NO GROWTH 5 DAYS Performed at [facility], [place] [address], [place], [place] [address]    Report Status 06/12/2021 FINAL  Final   Gastrointestinal Panel by PCR , Stool     Status: None   Collection Time: 06/07/21  8:38 PM   Specimen: STOOL  Result Value Ref Range Status  Campylobacter species NOT DETECTED NOT DETECTED Final   Plesimonas shigelloides NOT DETECTED NOT DETECTED Final   Salmonella species NOT DETECTED NOT DETECTED Final   Yersinia enterocolitica NOT DETECTED NOT DETECTED Final   Vibrio species NOT DETECTED NOT DETECTED Final   Vibrio cholerae NOT DETECTED NOT DETECTED Final   Enteroaggregative E coli (EAEC) NOT DETECTED NOT DETECTED Final   Enteropathogenic E coli (EPEC) NOT DETECTED NOT DETECTED Final   Enterotoxigenic E coli (ETEC) NOT DETECTED NOT DETECTED Final   Shiga like toxin producing E coli (STEC) NOT DETECTED NOT DETECTED Final   Shigella/Enteroinvasive E coli (EIEC) NOT DETECTED NOT DETECTED Final   Cryptosporidium NOT DETECTED NOT DETECTED Final   Cyclospora cayetanensis NOT DETECTED NOT DETECTED Final   Entamoeba histolytica NOT DETECTED NOT DETECTED Final   Giardia lamblia NOT DETECTED NOT DETECTED Final   Adenovirus F40/41 NOT DETECTED NOT DETECTED Final   Astrovirus NOT DETECTED NOT DETECTED Final   Norovirus GI/GII NOT DETECTED NOT DETECTED Final   Rotavirus A NOT DETECTED NOT DETECTED Final   Sapovirus (I, II, IV, and V) NOT DETECTED NOT DETECTED Final    Comment: Performed at [facility], [address] [place], [place] [address]  C Difficile Quick Screen (NO PCR Reflex)     Status: None   Collection Time: 06/07/21  8:38 PM   Specimen: STOOL  Result Value Ref Range Status   C Diff antigen NEGATIVE NEGATIVE Final   C Diff toxin NEGATIVE NEGATIVE Final   C Diff interpretation No C. difficile detected.  Final    Comment: Performed at [facility], [place] [address], [place], [place] [address]  Urine Culture     Status: Abnormal   Collection Time: 06/10/21  6:37 PM   Specimen: Urine, Clean Catch  Result Value Ref Range Status   Specimen Description URINE, CLEAN  CATCH  Final   Special Requests   Final    NONE Performed at [facility], [place] [address] [place], [place] [address]    Culture 20,000 COLONIES/mL PSEUDOMONAS AERUGINOSA (A)  Final   Report Status 06/13/2021 FINAL  Final   Organism ID, Bacteria PSEUDOMONAS AERUGINOSA (A)  Final      Susceptibility   Pseudomonas aeruginosa - MIC*    CEFTAZIDIME 4 SENSITIVE Sensitive     CIPROFLOXACIN <=0.25 SENSITIVE Sensitive     GENTAMICIN <=1 SENSITIVE Sensitive     IMIPENEM 1 SENSITIVE Sensitive     PIP/TAZO 8 SENSITIVE Sensitive     CEFEPIME 2 SENSITIVE Sensitive     * 20,000 COLONIES/mL PSEUDOMONAS AERUGINOSA  Culture, blood (routine x 2)     Status: None (Preliminary result)   Collection Time: 06/10/21  9:02 PM   Specimen: BLOOD  Result Value Ref Range Status   Specimen Description BLOOD LEFT ANTECUBITAL  Final   Special Requests   Final    BOTTLES DRAWN AEROBIC AND ANAEROBIC Blood Culture adequate volume   Culture   Final    NO GROWTH 3 DAYS Performed at [facility], [place] [address] [place], [place] [address]    Report Status PENDING  Incomplete  Culture, blood (routine x 2)     Status: None (Preliminary result)   Collection Time: 06/10/21  9:02 PM   Specimen: BLOOD  Result Value Ref Range Status   Specimen Description BLOOD RIGHT ANTECUBITAL  Final   Special Requests   Final    BOTTLES DRAWN AEROBIC AND ANAEROBIC Blood Culture results may not  be optimal due to an inadequate volume of blood received in culture bottles   Culture   Final    NO GROWTH 3 DAYS Performed at [facility], [place] [address], [place], [place] [address]    Report Status PENDING  Incomplete         Radiology Studies: CT PELVIS W CONTRAST  Result Date: 06/13/2021 CLINICAL DATA:  Psoas abscess EXAM: CT PELVIS WITH CONTRAST TECHNIQUE: Multidetector CT imaging of the pelvis was performed using the standard protocol following the bolus administration of intravenous contrast. RADIATION DOSE  REDUCTION: This exam was performed according to the departmental dose-optimization program which includes automated exposure control, adjustment of the mA and/or kV according to patient size and/or use of iterative reconstruction technique. CONTRAST:  76m OMNIPAQUE IOHEXOL 300 MG/ML  SOLN COMPARISON:  CT 06/01/2021, MRI 05/31/2021 FINDINGS: Urinary Tract:  No abnormality visualized. Bowel: Mild diverticulosis of the distal descending and proximal sigmoid colon. Moderate stool within the distal colon without evidence of obstruction. Previously noted circumferential thickening of the terminal ileum appears improved on the current examination. No evidence of focal inflammation. Appendix normal. No ascites. Vascular/Lymphatic: No pathologically enlarged lymph nodes. No significant vascular abnormality seen. Reproductive:  The prostate gland is unremarkable. Other: Diffuse subcutaneous body wall edema extending into the visualized upper extremities bilaterally has progressed slightly in the interval since prior examination. Trace retroperitoneal edema has also developed within the retroperitoneum surrounding the psoas bilaterally, within the space of Retzius, and within the presacral space. Heterogeneous attenuation and subtle thickening of the right iliopsoas musculature is again identified and appears stable since prior examination. 3 cm targetoid lesion within the right psoas at axial image # 43/3 is in compatible with the previously identified intramuscular hematoma and appears decreased in size since prior examination. No loculated retroperitoneal fluid collection identified. Musculoskeletal: No suspicious bone lesions identified. IMPRESSION: Interval decrease in size in intramuscular hematoma within the right psoas with stable associated minimal asymmetric muscular thickening. No loculated retroperitoneal fluid collections identified. Progressive anasarca. Improved circumferential bowel wall thickening involving  the terminal ileum and cecum. Mild distal colonic diverticulosis without superimposed focal inflammatory change. Electronically Signed   By: AFidela SalisburyM.D.   On: 06/13/2021 19:39        Scheduled Meds:  (feeding supplement) PROSource Plus  30 mL Oral BID BM   calcium acetate  1,334 mg Oral TID WC   Chlorhexidine Gluconate Cloth  6 each Topical Q0600   darbepoetin (ARANESP) injection - NON-DIALYSIS  100 mcg Subcutaneous Q Mon-1800   feeding supplement (NEPRO CARB STEADY)  237 mL Oral QHS   folic acid  1 mg Per Tube Daily   gabapentin  100 mg Oral BID   Gerhardt's butt cream   Topical TID   heparin injection (subcutaneous)  5,000 Units Subcutaneous Q8H   multivitamin  1 tablet Oral QHS   pantoprazole  40 mg Oral BID   Continuous Infusions:  sodium chloride Stopped (05/29/21 1602)   ceFEPime (MAXIPIME) IV 1 g (06/14/21 1000)   DAPTOmycin (CUBICIN)  IV      Assessment & Plan:   Principal Problem:   Cardiac arrest with pulseless electrical activity ([place] Active Problems:   AK[place] ([place] kidney injury) ([place]   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HCC)   Lower extremity weakness with left foot drop   Thrombocytosis   Hyponatremia   Acute posthemorrhagic anemia   Elevated liver enzymes   Acute respiratory failure with hypoxia and hypercapnia (HCC)   Septic shock due  to undetermined organism [facility])   Acute metabolic encephalopathy   Aspiration pneumonia of both lower lobes due to vomit ([place])   Protein-calorie malnutrition, severe   Ischemic enteritis versus colitis with ileus   right anterior knee wound   Mechanical fall 1/24 and hit his head   Cardiac arrest with pulseless electrical activity ([place]) S/p PEA arrest, s/p CPR x 20 minutes and Epi x 5. 2/4 hemodynamically stable.  He is mildly sinus tachycardic possibly from pain and fever        AKI (acute kidney injury) ([place])- (present on admission) with metabolic acidosis/rhabdomyolysis Patient has been off CRRT 1/15-1/19/2023. Nephrology  on board and appreciate recommendations.  Hoping for renal recovery, if no recovery will need a perm cath next week.   Improving CK levels. 2/2 intermittent HD now on MWF schedule. Hold off on permanent access creation since febrile 2/3 will receive IV ct contrast for IR procedure, needs close monitoring. Nephrology aware 2/5 had HD yesterday.will f/u with nephrology   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[place]) Fever.  Bcx obtained Cxr possible atelectasis Continue vancomycin 2/5 not much drainage could be done by IR Added cefepime to broaden coverage continued fever Continue daptomycin   +UCX Asx.  Minimally positive, per ID no tx required    Thrombocytosis Reactive or from infection   Lower extremity weakness with left foot drop MRI of the neuraxis  was unremarkable for acute process .  As per the MRI of the lumbar spine, multiple fluid collections in the right ileo psoas muscle.  Dr Radene Gunning spoke with neurology and neuro surgery on 1/24, no surgical cause for his lower extremity weakness. He suggested that a psoas abscess or hematoma could cause lumbar plexus compression /irritation leading to secondary weakness.   IR consulted for aspiration, suggested any aspiration would predispose to infection/ not amenable for aspiration or drain placement. Did not advise aspiration.  MRI of the femur shows Diffuse muscular abnormalities throughout the visualized portions of both thighs with heterogeneous T2 signal and enhancement, especially within the left gluteus maximus and quadratus femoris muscles. Findings are compatible with the diagnosis of rhabdomyolysis, with the differential including myositis related to connective tissue disease and polymyositis. No drainable abscess identified. pt unable to move the left lower extremity from severe pain and weakness.  Orthopedics consulted, suggested its a neurological/ lumbar plexus ischemic injury.  Hoping for recovery with therapy evaluations.  CK levels are  improving. Vitamin b12 and folate levels wnl. ESR is 85, CRP is 5.4   Pt has good pulse in DP, no sensory deficits. No signs of compartment syndrome.  2/2 continue gabapentin 2/5 CIR pending     Hyponatremia Due to volume overload Improving with dialysis    Acute posthemorrhagic anemia possiblly from ?psoas hematoma Stool fob + on 1/21 , suspected from colitis   Elevated liver enzymes Probably from ETOH abuse, shock liver and rhabdomyolysis.  Much improved   Mechanical fall 1/24 and hit his head -As per nursing report, prior to going to the OR, patient attempted to get out of the bed, fell and hit his head without any obvious external injuries or LOC.  Patient's friend was in the room. Advised nursing regarding strict fall precautions, placement of safety sitter if needed and monitor closely.   right anterior knee wound wound care consulted   Ischemic enteritis versus colitis with ileus General surgery consulted, there was concerned about an ischemic bowel.  Patient was taken to the OR for diagnostic laparoscopy, surgery revealed bowel in question  is thickened,  no ischemia was seen.  hence no further intervention at this time.  Advance diet as tolerated without any issues at this time. Diarrhea improved with imodium.   C. difficile PCR and GI panel negative  2/3 continue to monitor   Protein-calorie malnutrition, severe Supplement was added   Aspiration pneumonia of both lower lobes due to vomit ([place])- (present on admission) Completed course of abx   Acute metabolic encephalopathy- (present on admission) Resolved, at baseline   Septic shock due to undetermined organism [facility])- (present on admission) Vital stable   Acute respiratory failure with hypoxia and hypercapnia ([place])- (present on admission) Patient intubated on 05/24/2021 and extubated on 05/28/2021 .  patient completed course of antibiotics for aspiration pneumonia  2/3 currently on room air       DVT  prophylaxis: heparin Code Status:full Family Communication: none Disposition Plan:  Status is: Inpatient Remains inpatient appropriate because: IV treatment. Still febrile, w/u pending                LOS: 21 days   Time spent: 35 min    Nolberto Hanlon, MD [facility] Pager [phone]  If 7PM-7AM, please contact night-coverage 06/14/2021, 10:01 AM

## 2021-06-14 NOTE — Progress Notes (Signed)
Date and time results received: 06/14/21 1206 (use smartphrase ".now" to insert current time)  Test: hemoglobin Critical Value: 6.7  Name of Provider Notified: Lynn Ito  Orders Received? Or Actions Taken?: Orders Received - See Orders for details

## 2021-06-14 NOTE — TOC Progression Note (Signed)
Transition of Care [facility]) - Initial/Assessment Note    Patient Details  Name: Andres Braun MRN: [id number] Date of Birth: January 01, 1976  [facility]) CM/SW Contact:    Ralene Bathe, LCSWA Phone Number: 06/14/2021, 4:28 PM  Clinical Narrative:                 CSW received notification that CIR has signed off.  The patient's immigration status poses a barrier to SNF placement.  CSW will consult with [facility] leadership in reference to d/c planning.    Expected Discharge Plan: IP Rehab Facility Barriers to Discharge: Continued Medical Work up   Patient Goals and CMS Choice Patient states their goals for this hospitalization and ongoing recovery are:: To return home CMS Medicare.gov Compare Post Acute Care list provided to:: Patient Choice offered to / list presented to : Patient  Expected Discharge Plan and Services Expected Discharge Plan: IP Rehab Facility In-house Referral: Interpreting Services, PCP / [facility] Discharge Planning Services: CM Consult, Follow-up appt scheduled, MATCH Program Post Acute Care Choice: IP Rehab Living arrangements for the past 2 months: Single Family Home                                      Prior Living Arrangements/Services Living arrangements for the past 2 months: Single Family Home Lives with:: Self Patient language and need for interpreter reviewed:: Yes Do you feel safe going back to the place where you live?: Yes      Need for Family Participation in Patient Care: Yes (Comment) Care giver support system in place?: Yes (comment)   Criminal Activity/Legal Involvement Pertinent to Current Situation/Hospitalization: No - Comment as needed  Activities of Daily Living Home Assistive Devices/Equipment: None ADL Screening (condition at time of admission) Patient's cognitive ability adequate to safely complete daily activities?: Yes Is the patient deaf or have difficulty hearing?: No Does the patient have difficulty  seeing, even when wearing glasses/contacts?: No Does the patient have difficulty concentrating, remembering, or making decisions?: No Patient able to express need for assistance with ADLs?: Yes Does the patient have difficulty dressing or bathing?: No Independently performs ADLs?: Yes (appropriate for developmental age) Does the patient have difficulty walking or climbing stairs?: No Weakness of Legs: None Weakness of Arms/Hands: None  Permission Sought/Granted Permission sought to share information with : Case Manager, Magazine features editor, Family Supports Permission granted to share information with : Yes, Verbal Permission Granted              Emotional Assessment Appearance:: Appears stated age Attitude/Demeanor/Rapport: Engaged, Gracious Affect (typically observed): Accepting, Appropriate, Calm, Hopeful Orientation: : Oriented to Self, Oriented to Place, Oriented to  Time, Oriented to Situation Alcohol / Substance Use: Not Applicable Psych Involvement: No (comment)  Admission diagnosis:  Cardiac arrest (HCC) [I46.9] Shock (HCC) [R57.9] Hypothermia, initial encounter [T68.XXXA] Acute respiratory failure with hypoxia and hypercapnia (HCC) [J96.01, J96.02] Patient Active Problem List   Diagnosis Date Noted   Ischemic enteritis versus colitis with ileus 06/12/2021   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HCC) 06/12/2021   Lower extremity weakness with left foot drop 06/12/2021   right anterior knee wound 06/12/2021   Hyponatremia 06/12/2021   Mechanical fall 1/24 and hit his head 06/12/2021   Acute posthemorrhagic anemia 06/12/2021   Elevated liver enzymes 06/12/2021   Thrombocytosis 06/12/2021   Protein-calorie malnutrition, severe 06/03/2021   Aspiration pneumonia of both lower lobes due  to vomit ([place]) 05/27/2021   Acute respiratory failure with hypoxia and hypercapnia (HCC)    Septic shock due to undetermined organism ([place])    AKI (acute kidney injury) ([place])    Acute metabolic  encephalopathy    Cardiac arrest with pulseless electrical activity ([place]) 05/24/2021   PCP:  Pcp, No Pharmacy:  No Pharmacies Listed    Social Determinants of Health (SDOH) Interventions    Readmission Risk Interventions No flowsheet data found.

## 2021-06-15 LAB — RENAL FUNCTION PANEL
Albumin: 1.6 g/dL — ABNORMAL LOW (ref 3.5–5.0)
Anion gap: 11 (ref 5–15)
BUN: 50 mg/dL — ABNORMAL HIGH (ref 6–20)
CO2: 24 mmol/L (ref 22–32)
Calcium: 8.2 mg/dL — ABNORMAL LOW (ref 8.9–10.3)
Chloride: 97 mmol/L — ABNORMAL LOW (ref 98–111)
Creatinine, Ser: 5.4 mg/dL — ABNORMAL HIGH (ref 0.61–1.24)
GFR, Estimated: 13 mL/min — ABNORMAL LOW (ref >60)
Glucose, Bld: 95 mg/dL (ref 70–99)
Phosphorus: 5 mg/dL — ABNORMAL HIGH (ref 2.5–4.6)
Potassium: 3.6 mmol/L (ref 3.5–5.1)
Sodium: 132 mmol/L — ABNORMAL LOW (ref 135–145)

## 2021-06-15 LAB — CBC
HCT: 26.7 % — ABNORMAL LOW (ref 39.0–52.0)
Hemoglobin: 8.2 g/dL — ABNORMAL LOW (ref 13.0–17.0)
MCH: 26.2 pg (ref 26.0–34.0)
MCHC: 30.7 g/dL (ref 30.0–36.0)
MCV: 85.3 fL (ref 80.0–100.0)
Platelets: 650 10*3/uL — ABNORMAL HIGH (ref 150–400)
RBC: 3.13 MIL/uL — ABNORMAL LOW (ref 4.22–5.81)
RDW: 15.2 % (ref 11.5–15.5)
WBC: 9.4 10*3/uL (ref 4.0–10.5)
nRBC: 0 % (ref 0.0–0.2)

## 2021-06-15 LAB — BPAM RBC
Blood Product Expiration Date: 202303062359
ISSUE DATE / TIME: 202302051521
Unit Type and Rh: 5100

## 2021-06-15 LAB — CULTURE, BLOOD (ROUTINE X 2)
Culture: NO GROWTH
Culture: NO GROWTH
Special Requests: ADEQUATE

## 2021-06-15 LAB — TYPE AND SCREEN
ABO/RH(D): O POS
Antibody Screen: NEGATIVE
Unit division: 0

## 2021-06-15 MED ORDER — ALBUMIN HUMAN 25 % IV SOLN
25.0000 g | Freq: Once | INTRAVENOUS | Status: AC
  Administered 2021-06-15: 25 g via INTRAVENOUS
  Filled 2021-06-15: qty 100

## 2021-06-15 MED ORDER — DARBEPOETIN ALFA 150 MCG/0.3ML IJ SOSY
150.0000 ug | PREFILLED_SYRINGE | INTRAMUSCULAR | Status: DC
  Administered 2021-06-15: 150 ug via SUBCUTANEOUS
  Filled 2021-06-15: qty 0.3

## 2021-06-15 MED ORDER — SODIUM CHLORIDE 0.9 % IV SOLN: INTRAVENOUS | Status: AC

## 2021-06-15 MED ORDER — PREGABALIN 75 MG PO CAPS
75.0000 mg | ORAL_CAPSULE | Freq: Every day | ORAL | Status: DC
  Administered 2021-06-15 – 2021-06-17 (×3): 75 mg via ORAL
  Filled 2021-06-15 (×3): qty 1

## 2021-06-15 MED ORDER — SODIUM CHLORIDE 0.9 % IV SOLN
250.0000 mg | Freq: Every day | INTRAVENOUS | Status: AC
  Administered 2021-06-15 – 2021-06-16 (×2): 250 mg via INTRAVENOUS
  Filled 2021-06-15 (×2): qty 20

## 2021-06-15 NOTE — Progress Notes (Signed)
Nephrology Follow-Up Consult note   Assessment/Recommendations: Andres Braun is a/an 46 y.o. male with no significant past medical history admitted to ICU 1/15 s/p cardiac arrest. Nephrology consulted 1/15 for acute renal failure and CRRT due to refractory metabolic acidosis, required CRRT 1/15-1/19, now on intermittent HD 3x/weekly. Transferred to hospitalist service 1/24.  Acute renal failure vs severe AKI: In setting of cardiac arrest and rhabdomyolysis.  No documented PMH, but questionable CKD prior to this.  Definitive creatinine baseline difficult to elucidate, no prior measurements before admission.  CT abdomen pelvis 1/23 demonstrated bilateral enlarged kidneys c/w acute nephritis, no evidence of obstruction or lesion. Dr. Valentino Nose started discussions regarding [facility] and permanent access creation but because of ongoing fevers the [facility] has been delayed and the patient is hesitant to undergo AVF. Patient is uninsured. We will continue to reevaluate his qualifications of his ESRD routinely. - UOP last 24 hours (improved from 1.4L Sunday) - Cr rising between HD; 8.34 > Sat HD > 4.94 > 5.40 today - Did receive contrast for imaging after Saturday HD - Plan for HD T/Th/Sa - Last fever 102.1*F 2/04 @ 0131 - Continue to monitor daily Cr, Dose meds for GFR - Monitor Daily I/Os, Daily weight  - Maintain MAP>65 for optimal renal perfusion.  - Avoid nephrotoxic medications including NSAIDs and Vanc/Zosyn combo  Anemia: S/p transfusion 1/31. - Transfuse for Hgb<7 g/dL - Continue ESA  Hyperphosphatemia: Improved with binders.  S/p cardiac arrest: Asystole likely secondary to aspiration pneumonia/ARDS in context of EtOH ingestion  Psoas hematoma and fevers: ID following, last fever 2/04.  Potential abscess amenable to drainage.  Cefepime added by ID for Pseudomonas in urine culture.  Possible ischemic bowel: Ex lap this admission negative for ischemic bowel, only showed edema.  LE neuro  deficits: Ortho, neurology, neurosurgery seen.  Possibly due to psoas hematoma above.  Recommendations conveyed to primary service.   Fayette Pho, MD Family Medicine PGY-2 Nephrology Service [facility] 06/15/2021 8:24 AM  ___________________________________________________________  CC: Pain  Interval History/Subjective: Patient reports leg pain and swelling, pain persistent despite medication administration 1 hour ago.  Last had dialysis Saturday.  Discussed with him that he may undergo dialysis today, he is unsure if he should be getting dialysis today.  Attempted to discuss UOP and leg swelling, but patient most concerned about left leg swelling and pain.  Medications:  Current Facility-Administered Medications  Medication Dose Route Frequency Provider Last Rate Last Admin   (feeding supplement) PROSource Plus liquid 30 mL  30 mL Oral BID BM Blake Divine, Vijaya, MD   30 mL at 06/14/21 1656   0.9 %  sodium chloride infusion  250 mL Intravenous Continuous Emelia Loron, MD   Stopped at 05/29/21 1602   acetaminophen (TYLENOL) tablet 650 mg  650 mg Oral Q6H PRN Eduard Clos, MD   650 mg at 06/14/21 2155   albuterol (PROVENTIL) (2.5 MG/3ML) 0.083% nebulizer solution 2.5 mg  2.5 mg Nebulization Q2H PRN Emelia Loron, MD   2.5 mg at 05/24/21 1143   calcium acetate (PHOSLO) capsule 1,334 mg  1,334 mg Oral TID WC Annie Sable, MD   1,334 mg at 06/14/21 1656   ceFEPIme (MAXIPIME) 1 g in sodium chloride 0.9 % 100 mL IVPB  1 g Intravenous Q24H Darnell Level, MD 200 mL/hr at 06/14/21 1000 1 g at 06/14/21 1000   Chlorhexidine Gluconate Cloth 2 % PADS 6 each  6 each Topical Q0600 Annie Sable, MD   6 each  at 06/15/21 0600   DAPTOmycin (CUBICIN) 700 mg in sodium chloride 0.9 % IVPB  8 mg/kg Intravenous Q48H Khalik Pewitt Ito, MD 128 mL/hr at 06/14/21 2206 700 mg at 06/14/21 2206   Darbepoetin Alfa (ARANESP) injection 100 mcg  100 mcg Subcutaneous Q Mon-1800  Emelia Loron, MD   100 mcg at 06/08/21 1550   feeding supplement (NEPRO CARB STEADY) liquid 237 mL  237 mL Oral QHS Kathlen Mody, MD   237 mL at 06/12/21 2117   folic acid (FOLVITE) tablet 1 mg  1 mg Per Tube Daily Emelia Loron, MD   1 mg at 06/14/21 1001   gabapentin (NEURONTIN) capsule 100 mg  100 mg Oral BID Kathlen Mody, MD   100 mg at 06/14/21 2155   Gerhardt's butt cream   Topical TID Swayze, Ava, DO   Given at 06/14/21 2210   guaiFENesin-dextromethorphan (ROBITUSSIN DM) 100-10 MG/5ML syrup 5 mL  5 mL Oral Q4H PRN Olalere, Adewale A, MD       heparin injection 5,000 Units  5,000 Units Subcutaneous Q8H Emelia Loron, MD   5,000 Units at 06/15/21 0600   hydrALAZINE (APRESOLINE) injection 10 mg  10 mg Intravenous Q6H PRN Emelia Loron, MD       morphine 2 MG/ML injection 2 mg  2 mg Intravenous Q2H PRN Jacinto Halim, PA-C   2 mg at 06/15/21 0346   multivitamin (RENA-VIT) tablet 1 tablet  1 tablet Oral QHS Swayze, Ava, DO   1 tablet at 06/14/21 2155   oxyCODONE (Oxy IR/ROXICODONE) immediate release tablet 5-10 mg  5-10 mg Oral Q4H PRN Jacinto Halim, PA-C   10 mg at 06/14/21 2155   pantoprazole (PROTONIX) EC tablet 40 mg  40 mg Oral BID Kathlen Mody, MD   40 mg at 06/14/21 2155     Review of Systems: 10 systems reviewed and negative except per interval history/subjective  Physical Exam: Vitals:   06/14/21 2104 06/15/21 0510  BP: 128/71 140/90  Pulse: (!) 107 (!) 113  Resp: 18 18  Temp: 98.7 F (37.1 C) 99.1 F (37.3 C)  SpO2: 100% 99%   No intake/output data recorded.  Intake/Output Summary (Last 24 hours) at 06/15/2021 8144 Last data filed at 06/15/2021 8185 Gross per 24 hour  Intake 2765 ml  Output 4150 ml  Net -1385 ml   Constitutional: well-appearing, no acute distress CV: normal rate and rhythm, no murmurs appreciated, RLE with 1+ pitting edema of ankle, LLE with 1+ to knee, trace pitting edema to mid thigh Respiratory: clear to auscultation,  normal work of breathing Psych: alert, judgement/insight appropriate, appropriate mood and affect  Test Results I personally reviewed new and old clinical labs and radiology tests Lab Results  Component Value Date   NA 132 (L) 06/15/2021   K 3.6 06/15/2021   CL 97 (L) 06/15/2021   CO2 24 06/15/2021   BUN 50 (H) 06/15/2021   CREATININE 5.40 (H) 06/15/2021   CALCIUM 8.2 (L) 06/15/2021   ALBUMIN 1.6 (L) 06/15/2021   PHOS 5.0 (H) 06/15/2021

## 2021-06-15 NOTE — Progress Notes (Signed)
Occupational Therapy Treatment Patient Details Name: Andres Braun MRN: [id number] DOB: [birth year] Today's Date: 06/15/2021   History of present illness 46 y.o. male admitted 05/24/21 after cardiac arrest at home; pt with agonal breathing, lost pulse, required 20-min CPR and 5 rounds epi. Workup for acute hypoxic respiratory failure, septic shock due to undertermined organism, AKI, acute metabolic encephalopathy, aspiration PNA of bilateral lower lobes due to vomit. ETT 1/15-1/19. CRRT 1/15-1/20. Brain MRI 1/22 negative. Cervical MRI 1/22 with small R-side disc protrusion C5-6. Abdominal CT showed muscular swelling, particularly iliopsoas and gluteal muscles consistent with rhabdomyolysis; hemorrhage noted within R iliopsoas. S/p diagnostic laparoscopy 1/24. MRI L femur 1/25 showed diffuse muscular abnormalities bilateral thighs with heterogenous T2 signal, especially within L glut max and gradratus femoris; findings compatible with dx of rhabdomyolysis, no drainable abscess identified; these findings could contribute to compartment syndrome. Unknown PMH.   OT comments  This 46 yo male continues to stay he can't move his LLE but he does have some movement in it (not near normal and not what he wants)--but does not seem to understand that working with therapy is a way to help make it stronger (despite having in person interpreter in room each session). He is moving OOB and sit<>stand with sara stedy better but is very limited with LBADLs at current. He will continue to benefit from acute OT with follow up on AIR if A post D/C can be found otherwise SNF.   Recommendations for follow up therapy are one component of a multi-disciplinary discharge planning process, led by the attending physician.  Recommendations may be updated based on patient status, additional functional criteria and insurance authorization.    Follow Up Recommendations  Acute inpatient rehab (3hours/day)    Assistance Recommended at  Discharge Frequent or constant Supervision/Assistance  Patient can return home with the following  Two people to help with walking and/or transfers;Two people to help with bathing/dressing/bathroom;Assistance with cooking/housework;Assist for transportation;Direct supervision/assist for medications management;Help with stairs or ramp for entrance   Equipment Recommendations  BSC/3in1;Wheelchair (measurements OT);Wheelchair cushion (measurements OT)       Precautions / Restrictions Precautions Precautions: Fall Precaution Comments: Now back to c/o pain in LLE at hip Restrictions Weight Bearing Restrictions: No       Mobility Bed Mobility Overal bed mobility: Needs Assistance Bed Mobility: Supine to Sit   Sidelying to sit: Mod assist, HOB elevated, +2 for physical assistance       General bed mobility comments: assist for LE translation to EOB, trunk elevation, and steadying once sitting. Complaining of significant LLE and hip pain during mobility.    Transfers Overall transfer level: Needs assistance Equipment used: Ambulation equipment used Transfers: Sit to/from Stand Sit to Stand: Mod assist, +2 physical assistance, From elevated surface           General transfer comment: mod +2 for power up, hip extension via posterior facilitation, and truncal tactile cuing for upright. Max cues for sequencing, hand placement when rising/sitting, slow eccentric sit. STS x4 from stedy, x1 from recliner.     Balance Overall balance assessment: Needs assistance Sitting-balance support: Feet supported, Bilateral upper extremity supported Sitting balance-Leahy Scale: Fair     Standing balance support: Bilateral upper extremity supported Standing balance-Leahy Scale: Poor Standing balance comment: When he stands with sara stedy he starts to come up and then hunches over propping on hand rest of sara stedy  ADL either performed or assessed with  clinical judgement   ADL Overall ADL's : Needs assistance/impaired                       Lower Body Dressing Details (indicate cue type and reason): Pt cannot bend forward to get to his feet for LBD nor bring legs up to him (weak and pain)--will need to try AE next time focus is on LBD Toilet Transfer: Moderate assistance;+2 for physical assistance;+2 for safety/equipment Toilet Transfer Details (indicate cue type and reason): sara stedy                Extremity/Trunk Assessment Upper Extremity Assessment Upper Extremity Assessment: Overall WFL for tasks assessed            Vision Patient Visual Report: No change from baseline            Cognition Arousal/Alertness: Awake/alert Behavior During Therapy: Flat affect Overall Cognitive Status: Impaired/Different from baseline Area of Impairment: Attention, Memory, Following commands, Safety/judgement, Awareness, Problem solving                   Current Attention Level: Sustained Memory: Decreased recall of precautions, Decreased short-term memory Following Commands: Follows one step commands with increased time Safety/Judgement: Decreased awareness of safety, Decreased awareness of deficits Awareness: Intellectual Problem Solving: Slow processing, Difficulty sequencing General Comments: Pt lacking understanding into deficits and diagnoses, states "my kidneys are fine, I am peeing, I can be off dialysis" and does not understand reason for L hip and flank pain. Pt requires cues to stay on task, at times requires mod encouragement to participate given pain. Ashby Dawes, in-person interpreter, utilized during session.                   Pertinent Vitals/ Pain       Pain Assessment Pain Assessment: 0-10 Faces Pain Scale: Hurts little more Pain Location: L flank and hip Pain Descriptors / Indicators: Grimacing, Sharp, Guarding Pain Intervention(s): Limited activity within patient's tolerance, Monitored  during session, Repositioned         Frequency  Min 2X/week        Progress Toward Goals  OT Goals(current goals can now be found in the care plan section)  Progress towards OT goals: Progressing toward goals  Acute Rehab OT Goals Patient Stated Goal: "for right leg to work" OT Goal Formulation: With patient Time For Goal Achievement: 06/26/21 Potential to Achieve Goals: Good  Plan Discharge plan remains appropriate    Co-evaluation    PT/OT/SLP Co-Evaluation/Treatment: Yes Reason for Co-Treatment: For patient/therapist safety;To address functional/ADL transfers PT goals addressed during session: Mobility/safety with mobility;Balance;Strengthening/ROM OT goals addressed during session: ADL's and self-care;Strengthening/ROM      AM-PAC OT "6 Clicks" Daily Activity     Outcome Measure   Help from another person eating meals?: None Help from another person taking care of personal grooming?: A Little Help from another person toileting, which includes using toliet, bedpan, or urinal?: A Lot Help from another person bathing (including washing, rinsing, drying)?: A Lot Help from another person to put on and taking off regular upper body clothing?: A Little Help from another person to put on and taking off regular lower body clothing?: Total 6 Click Score: 15    End of Session Equipment Utilized During Treatment: Gait belt (sara stedy)  OT Visit Diagnosis: Unsteadiness on feet (R26.81);Other abnormalities of gait and mobility (R26.89);Muscle weakness (generalized) (M62.81);Other symptoms and signs involving cognitive function;Pain  Pain - Right/Left: Left Pain - part of body: Hip   Activity Tolerance Patient tolerated treatment well   Patient Left in chair;with call bell/phone within reach;with chair alarm set           Time: 1165-7903 OT Time Calculation (min): 34 min  Charges: OT General Charges $OT Visit: 1 Visit OT Treatments $Self Care/Home Management :  8-22 mins  Ignacia Palma, OTR/L Acute [employer] Pager [phone] Office [phone]    Evette Georges 06/15/2021, 3:12 PM

## 2021-06-15 NOTE — Progress Notes (Addendum)
PROGRESS NOTE    Andres Braun  YTR:[id number] DOB: 1975-12-10 DOA: 05/24/2021 PCP: Merryl Hacker, No    Brief Narrative:  46 year old male admitted to ICU 1/15 under PCCM care after cardiac arrest at home.  PEA arrest, s/p CPR x20 minutes and epi x5.  Patient reportedly out drinking with his friends the night prior to admission.  Found by friends with agonal breathing.  Intubated on arrival, needed vasopressors, noted to have renal failure, rhabdomyolysis with CK in the 14 K range, treated for aspiration pneumonia, nephrology consulted and started on CRRT.  Extubated 1/19 and off pressors.  Mental status slowly improved.  Transferred to telemetry.  Care transferred to [facility] on 1/24.  Nephrology following for HD needs.  General surgery on board for questionable small bowel ischemia and taken emergently to surgery 1/24. The bowel in question was found to be thickened, but viable. Hospital course complicated by unexplained left lower extremity weakness. Extensive work up of the neuraxis is negative for compressive pathology. Differential is probably a psoas hematoma causing lumbar plexus compression/ irritation leading to secondary weakness. IR suggested that it is not amenable for aspiration or drain placement. Neurology consulted, suggested not a neurological injury as the neuraxis is negative.    2/2 feels little better this am. No new complaints. No overnight issues. Tmax  101.7.  2/3 still with b/lLE pain. Spoke to IR and nephrology as pt needs IV ct contrast for IR drainage.  2/4 had HD today 2/5 complaining of left leg pain again.  Able to move his right foot more today per patient as he is showing me.  Left foot still difficulty moving it.ovennight sinus tachycardic. 2/6 reports able to move Rt leg more. Still with pain/'spasm of Lt leg>Rt      Consultants:  Nephrology , ID,pccm, surgery neurology  Procedures:   Antimicrobials:     Subjective: No new complaints. No sob,  cp  Objective: Vitals:   06/14/21 1540 06/14/21 2005 06/14/21 2104 06/15/21 0510  BP: 130/85 131/88 128/71 140/90  Pulse: (!) 115 (!) 111 (!) 107 (!) 113  Resp: 19 18 18 18   Temp: 98.9 F (37.2 C) 99 F (37.2 C) 98.7 F (37.1 C) 99.1 F (37.3 C)  TempSrc: Oral Oral Oral Oral  SpO2: 100% 100% 100% 99%  Weight:      Height:        Intake/Output Summary (Last 24 hours) at 06/15/2021 0944 Last data filed at 06/15/2021 0506 Gross per 24 hour  Intake 2765 ml  Output 4150 ml  Net -1385 ml   Filed Weights   06/10/21 1124 06/13/21 0506 06/14/21 0536  Weight: 85.5 kg 86.8 kg 83.2 kg    Examination: Calm, NAD Cta no w/r Reg s1/s2 no gallop Soft benign +bs Mild edema b/l. Moves Rt leg, but doesn't move LT .  Aaoxox3  Mood and affect appropriate in current setting    Data Reviewed: I have personally reviewed following labs and imaging studies  CBC: Recent Labs  Lab 06/08/21 1022 06/09/21 0359 06/09/21 1950 06/10/21 0405 06/11/21 0145 06/13/21 0830 06/14/21 1125 06/15/21 0842  WBC 21.8* 19.5*  --  17.3* 13.1* 12.0* 10.0 9.4  NEUTROABS 18.5* 15.9*  --  14.0*  --   --   --   --   HGB 7.3* 6.8*   < > 7.4* 7.4* 7.2* 6.7* 8.2*  HCT 22.0* 20.4*   < > 22.6* 23.5* 22.7* 22.2* 26.7*  MCV 85.3 84.6  --  85.3 88.3 86.3 88.4  85.3  PLT 784* 700*  --  667* 693* 696* 637* 650*   < > = values in this interval not displayed.   Basic Metabolic Panel: Recent Labs  Lab 06/11/21 0145 06/12/21 0450 06/13/21 0401 06/14/21 0048 06/15/21 0337  NA 132* 131* 131* 136 132*  K 4.1 4.7 3.9 3.5 3.6  CL 95* 97* 95* 99 97*  CO2 26 23 22 25 24   GLUCOSE 118* 105* 99 134* 95  BUN 37* 55* 70* 40* 50*  CREATININE 6.01* 7.86* 8.34* 4.94* 5.40*  CALCIUM 7.8* 8.0* 8.1* 7.9* 8.2*  PHOS 5.9* 7.0* 7.3* 4.1 5.0*   GFR: Estimated Creatinine Clearance: 19 mL/min (A) (by C-G formula based on SCr of 5.4 mg/dL (H)). Liver Function Tests: Recent Labs  Lab 06/08/21 1022 06/09/21 0359 06/11/21 0145  06/12/21 0450 06/13/21 0401 06/14/21 0048 06/15/21 0337  AST 25  --   --   --   --   --   --   ALT 16  --   --   --   --   --   --   ALKPHOS 148*  --   --   --   --   --   --   BILITOT 0.2*  --   --   --   --   --   --   PROT 5.3*  --   --   --   --   --   --   ALBUMIN 1.8*   1.6*   < > 1.5* 1.6* 1.5* <1.5* 1.6*   < > = values in this interval not displayed.   No results for input(s): LIPASE, AMYLASE in the last 168 hours. No results for input(s): AMMONIA in the last 168 hours. Coagulation Profile: No results for input(s): INR, PROTIME in the last 168 hours. Cardiac Enzymes: Recent Labs  Lab 06/11/21 0145  CKTOTAL 131   BNP (last 3 results) No results for input(s): PROBNP in the last 8760 hours. HbA1C: No results for input(s): HGBA1C in the last 72 hours. CBG: No results for input(s): GLUCAP in the last 168 hours. Lipid Profile: No results for input(s): CHOL, HDL, LDLCALC, TRIG, CHOLHDL, LDLDIRECT in the last 72 hours. Thyroid Function Tests: No results for input(s): TSH, T4TOTAL, FREET4, T3FREE, THYROIDAB in the last 72 hours. Anemia Panel: No results for input(s): VITAMINB12, FOLATE, FERRITIN, TIBC, IRON, RETICCTPCT in the last 72 hours. Sepsis Labs: Recent Labs  Lab 06/08/21 1022  PROCALCITON 0.80    Recent Results (from the past 240 hour(s))  Culture, blood (routine x 2)     Status: None   Collection Time: 06/07/21 10:14 AM   Specimen: BLOOD  Result Value Ref Range Status   Specimen Description BLOOD RIGHT ANTECUBITAL  Final   Special Requests   Final    BOTTLES DRAWN AEROBIC ONLY Blood Culture adequate volume   Culture   Final    NO GROWTH 5 DAYS Performed at [facility], [address], [place], [place] [address]    Report Status 06/12/2021 FINAL  Final  Culture, blood (routine x 2)     Status: None   Collection Time: 06/07/21 10:17 AM   Specimen: BLOOD RIGHT HAND  Result Value Ref Range Status   Specimen Description BLOOD RIGHT HAND  Final    Special Requests   Final    BOTTLES DRAWN AEROBIC ONLY Blood Culture adequate volume   Culture   Final    NO GROWTH 5 DAYS Performed at  [facility], [place] [address], [place], [place] [address]    Report Status 06/12/2021 FINAL  Final  Gastrointestinal Panel by PCR , Stool     Status: None   Collection Time: 06/07/21  8:38 PM   Specimen: STOOL  Result Value Ref Range Status   Campylobacter species NOT DETECTED NOT DETECTED Final   Plesimonas shigelloides NOT DETECTED NOT DETECTED Final   Salmonella species NOT DETECTED NOT DETECTED Final   Yersinia enterocolitica NOT DETECTED NOT DETECTED Final   Vibrio species NOT DETECTED NOT DETECTED Final   Vibrio cholerae NOT DETECTED NOT DETECTED Final   Enteroaggregative E coli (EAEC) NOT DETECTED NOT DETECTED Final   Enteropathogenic E coli (EPEC) NOT DETECTED NOT DETECTED Final   Enterotoxigenic E coli (ETEC) NOT DETECTED NOT DETECTED Final   Shiga like toxin producing E coli (STEC) NOT DETECTED NOT DETECTED Final   Shigella/Enteroinvasive E coli (EIEC) NOT DETECTED NOT DETECTED Final   Cryptosporidium NOT DETECTED NOT DETECTED Final   Cyclospora cayetanensis NOT DETECTED NOT DETECTED Final   Entamoeba histolytica NOT DETECTED NOT DETECTED Final   Giardia lamblia NOT DETECTED NOT DETECTED Final   Adenovirus F40/41 NOT DETECTED NOT DETECTED Final   Astrovirus NOT DETECTED NOT DETECTED Final   Norovirus GI/GII NOT DETECTED NOT DETECTED Final   Rotavirus A NOT DETECTED NOT DETECTED Final   Sapovirus (I, II, IV, and V) NOT DETECTED NOT DETECTED Final    Comment: Performed at [facility], [address] [place], [place] [address]  C Difficile Quick Screen (NO PCR Reflex)     Status: None   Collection Time: 06/07/21  8:38 PM   Specimen: STOOL  Result Value Ref Range Status   C Diff antigen NEGATIVE NEGATIVE Final   C Diff toxin NEGATIVE NEGATIVE Final   C Diff interpretation No C. difficile detected.  Final    Comment:  Performed at [facility], [place] [address] [place], [place] [address]  Urine Culture     Status: Abnormal   Collection Time: 06/10/21  6:37 PM   Specimen: Urine, Clean Catch  Result Value Ref Range Status   Specimen Description URINE, CLEAN CATCH  Final   Special Requests   Final    NONE Performed at [facility], [place] [address] [place], [place] [address]    Culture 20,000 COLONIES/mL PSEUDOMONAS AERUGINOSA (A)  Final   Report Status 06/13/2021 FINAL  Final   Organism ID, Bacteria PSEUDOMONAS AERUGINOSA (A)  Final      Susceptibility   Pseudomonas aeruginosa - MIC*    CEFTAZIDIME 4 SENSITIVE Sensitive     CIPROFLOXACIN <=0.25 SENSITIVE Sensitive     GENTAMICIN <=1 SENSITIVE Sensitive     IMIPENEM 1 SENSITIVE Sensitive     PIP/TAZO 8 SENSITIVE Sensitive     CEFEPIME 2 SENSITIVE Sensitive     * 20,000 COLONIES/mL PSEUDOMONAS AERUGINOSA  Culture, blood (routine x 2)     Status: None   Collection Time: 06/10/21  9:02 PM   Specimen: BLOOD  Result Value Ref Range Status   Specimen Description BLOOD LEFT ANTECUBITAL  Final   Special Requests   Final    BOTTLES DRAWN AEROBIC AND ANAEROBIC Blood Culture adequate volume   Culture   Final    NO GROWTH 5 DAYS Performed at [facility], [place] [address], [place], [place] [address]    Report Status 06/15/2021 FINAL  Final  Culture, blood (routine x 2)  Status: None   Collection Time: 06/10/21  9:02 PM   Specimen: BLOOD  Result Value Ref Range Status   Specimen Description BLOOD RIGHT ANTECUBITAL  Final   Special Requests   Final    BOTTLES DRAWN AEROBIC AND ANAEROBIC Blood Culture results may not be optimal due to an inadequate volume of blood received in culture bottles   Culture   Final    NO GROWTH 5 DAYS Performed at [facility], [place] [address], [place], [place] [address]    Report Status 06/15/2021 FINAL  Final  Resp Panel by RT-PCR (Flu A&B, Covid) Nasopharyngeal Swab     Status: None   Collection  Time: 06/14/21 10:16 AM   Specimen: Nasopharyngeal Swab; Nasopharyngeal(NP) swabs in vial transport medium  Result Value Ref Range Status   SARS Coronavirus 2 by RT PCR NEGATIVE NEGATIVE Final    Comment: (NOTE) SARS-CoV-2 target nucleic acids are NOT DETECTED.  The SARS-CoV-2 RNA is generally detectable in upper respiratory specimens during the acute phase of infection. The lowest concentration of SARS-CoV-2 viral copies this assay can detect is 138 copies/mL. A negative result does not preclude SARS-Cov-2 infection and should not be used as the sole basis for treatment or other patient management decisions. A negative result may occur with  improper specimen collection/handling, submission of specimen other than nasopharyngeal swab, presence of viral mutation(s) within the areas targeted by this assay, and inadequate number of viral copies(<138 copies/mL). A negative result must be combined with clinical observations, patient history, and epidemiological information. The expected result is Negative.  Fact Sheet for Patients:  EntrepreneurPulse.com.au  Fact Sheet for Healthcare Providers:  IncredibleEmployment.be  This test is no t yet approved or cleared by the [place] FDA and  has been authorized for detection and/or diagnosis of SARS-CoV-2 by FDA under an Emergency Use Authorization (EUA). This EUA will remain  in effect (meaning this test can be used) for the duration of the COVID-19 declaration under Section 564(b)(1) of the Act, 21 U.S.C.section 360bbb-3(b)(1), unless the authorization is terminated  or revoked sooner.       Influenza A by PCR NEGATIVE NEGATIVE Final   Influenza B by PCR NEGATIVE NEGATIVE Final    Comment: (NOTE) The Xpert Xpress SARS-CoV-2/FLU/RSV plus assay is intended as an aid in the diagnosis of influenza from Nasopharyngeal swab specimens and should not be used as a sole basis for treatment. Nasal washings  and aspirates are unacceptable for Xpert Xpress SARS-CoV-2/FLU/RSV testing.  Fact Sheet for Patients: EntrepreneurPulse.com.au  Fact Sheet for Healthcare Providers: IncredibleEmployment.be  This test is not yet approved or cleared by the [place] FDA and has been authorized for detection and/or diagnosis of SARS-CoV-2 by FDA under an Emergency Use Authorization (EUA). This EUA will remain in effect (meaning this test can be used) for the duration of the COVID-19 declaration under Section 564(b)(1) of the Act, 21 U.S.C. section 360bbb-3(b)(1), unless the authorization is terminated or revoked.  Performed at [facility], [place] [address] [place], [place] [address]          Radiology Studies: CT PELVIS W CONTRAST  Result Date: 06/13/2021 CLINICAL DATA:  Psoas abscess EXAM: CT PELVIS WITH CONTRAST TECHNIQUE: Multidetector CT imaging of the pelvis was performed using the standard protocol following the bolus administration of intravenous contrast. RADIATION DOSE REDUCTION: This exam was performed according to the departmental dose-optimization program which includes automated exposure control, adjustment of the mA and/or kV according to patient size and/or  use of iterative reconstruction technique. CONTRAST:  36m OMNIPAQUE IOHEXOL 300 MG/ML  SOLN COMPARISON:  CT 06/01/2021, MRI 05/31/2021 FINDINGS: Urinary Tract:  No abnormality visualized. Bowel: Mild diverticulosis of the distal descending and proximal sigmoid colon. Moderate stool within the distal colon without evidence of obstruction. Previously noted circumferential thickening of the terminal ileum appears improved on the current examination. No evidence of focal inflammation. Appendix normal. No ascites. Vascular/Lymphatic: No pathologically enlarged lymph nodes. No significant vascular abnormality seen. Reproductive:  The prostate gland is unremarkable. Other: Diffuse subcutaneous body  wall edema extending into the visualized upper extremities bilaterally has progressed slightly in the interval since prior examination. Trace retroperitoneal edema has also developed within the retroperitoneum surrounding the psoas bilaterally, within the space of Retzius, and within the presacral space. Heterogeneous attenuation and subtle thickening of the right iliopsoas musculature is again identified and appears stable since prior examination. 3 cm targetoid lesion within the right psoas at axial image # 43/3 is in compatible with the previously identified intramuscular hematoma and appears decreased in size since prior examination. No loculated retroperitoneal fluid collection identified. Musculoskeletal: No suspicious bone lesions identified. IMPRESSION: Interval decrease in size in intramuscular hematoma within the right psoas with stable associated minimal asymmetric muscular thickening. No loculated retroperitoneal fluid collections identified. Progressive anasarca. Improved circumferential bowel wall thickening involving the terminal ileum and cecum. Mild distal colonic diverticulosis without superimposed focal inflammatory change. Electronically Signed   By: AFidela SalisburyM.D.   On: 06/13/2021 19:39        Scheduled Meds:  (feeding supplement) PROSource Plus  30 mL Oral BID BM   calcium acetate  1,334 mg Oral TID WC   Chlorhexidine Gluconate Cloth  6 each Topical Q0600   darbepoetin (ARANESP) injection - NON-DIALYSIS  100 mcg Subcutaneous Q Mon-1800   feeding supplement (NEPRO CARB STEADY)  237 mL Oral QHS   folic acid  1 mg Per Tube Daily   gabapentin  100 mg Oral BID   Gerhardt's butt cream   Topical TID   heparin injection (subcutaneous)  5,000 Units Subcutaneous Q8H   multivitamin  1 tablet Oral QHS   pantoprazole  40 mg Oral BID   Continuous Infusions:  sodium chloride Stopped (05/29/21 1602)   ceFEPime (MAXIPIME) IV 1 g (06/15/21 0854)   DAPTOmycin (CUBICIN)  IV 700 mg  (06/14/21 2206)    Assessment & Plan:   Principal Problem:   Cardiac arrest with pulseless electrical activity ([place] Active Problems:   AKI (acute kidney injury) ([facility]   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[facility]   Lower extremity weakness with left foot drop   Thrombocytosis   Hyponatremia   Acute posthemorrhagic anemia   Elevated liver enzymes   Acute respiratory failure with hypoxia and hypercapnia (HCC)   Septic shock due to undetermined organism ([facility]   Acute metabolic encephalopathy   Aspiration pneumonia of both lower lobes due to vomit ([facility]   Protein-calorie malnutrition, severe   Ischemic enteritis versus colitis with ileus   right anterior knee wound   Mechanical fall 1/24 and hit his head   Cardiac arrest with pulseless electrical activity ([place] S/p PEA arrest, s/p CPR x 20 minutes and Epi x 5. 2/6 now stable.        AKI (acute kidney injury) ([place]- (present on admission) with metabolic acidosis/rhabdomyolysis Patient has been off CRRT 1/15-1/19/2023. Nephrology on board and appreciate recommendations.  Hoping for renal recovery, if no recovery will need a perm cath next week.   Improving CK  levels. 2/2 intermittent HD now on MWF schedule. Hold off on permanent access creation since febrile, waiting for clearance fo rthis to be placed 2/3 will receive IV ct contrast for IR procedure, needs close monitoring. Nephrology aware 2/6 hold off on hemodialysis on 2/7 for now and assess needs daily  Albumin once now  Normal saline at 75 MLS per hour x12 hours  Overall UOP much improved       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HCC) Bcx obtained Cxr possible atelectasis Continue vancomycin 2/5 not much drainage could be done by IR 2/6 ID following Continue daptomycin and cefepime  +UCX Asx.  Minimally positive, per ID no tx required     Thrombocytosis Reactive or from infection   Lower extremity weakness with left foot drop MRI of the neuraxis  was unremarkable for acute process .  As per  the MRI of the lumbar spine, multiple fluid collections in the right ileo psoas muscle.  Dr Radene Gunning spoke with neurology and neuro surgery on 1/24, no surgical cause for his lower extremity weakness. He suggested that a psoas abscess or hematoma could cause lumbar plexus compression /irritation leading to secondary weakness.   IR consulted for aspiration, suggested any aspiration would predispose to infection/ not amenable for aspiration or drain placement. Did not advise aspiration.  MRI of the femur shows Diffuse muscular abnormalities throughout the visualized portions of both thighs with heterogeneous T2 signal and enhancement, especially within the left gluteus maximus and quadratus femoris muscles. Findings are compatible with the diagnosis of rhabdomyolysis, with the differential including myositis related to connective tissue disease and polymyositis. No drainable abscess identified. pt unable to move the left lower extremity from severe pain and weakness.  Orthopedics consulted, suggested its a neurological/ lumbar plexus ischemic injury.  Hoping for recovery with therapy evaluations.  CK levels are improving. Vitamin b12 and folate levels wnl. ESR is 85, CRP is 5.4   Pt has good pulse in DP, no sensory deficits. No signs of compartment syndrome.  2/5 CIR pending 2/6 since still with pain will switch gabapentin to Lyrica if it improves pain Will ask neurology to reevaluate pt since still unable to move LT leg     Hyponatremia Due to volume overload Improved with HD Variable at times    Acute posthemorrhagic anemia possiblly from ?psoas hematoma Stool fob + on 1/21 , suspected from colitis 2/6 s/p 1 unit prbc Hg stable   Elevated liver enzymes Probably from ETOH abuse, shock liver and rhabdomyolysis.  Much improved   Mechanical fall 1/24 and hit his head -As per nursing report, prior to going to the OR, patient attempted to get out of the bed, fell and hit his head without any  obvious external injuries or LOC.  Patient's friend was in the room. Advised nursing regarding strict fall precautions, placement of safety sitter if needed and monitor closely.   right anterior knee wound wound care consulted   Ischemic enteritis versus colitis with ileus General surgery consulted, there was concerned about an ischemic bowel.  Patient was taken to the OR for diagnostic laparoscopy, surgery revealed bowel in question is thickened,  no ischemia was seen.  hence no further intervention at this time.  Advance diet as tolerated without any issues at this time. Diarrhea improved with imodium.   C. difficile PCR and GI panel negative     Protein-calorie malnutrition, severe Supplement was added   Aspiration pneumonia of both lower lobes due to vomit [facility])- (present on admission) Completed course  of abx   Acute metabolic encephalopathy- (present on admission) Resolved, at baseline   Septic shock due to undetermined organism [facility])- (present on admission) Vital stable   Acute respiratory failure with hypoxia and hypercapnia ([place])- (present on admission) Patient intubated on 05/24/2021 and extubated on 05/28/2021 .  patient completed course of antibiotics for aspiration pneumonia  On RA       DVT prophylaxis: heparin Code Status:full Family Communication: none Disposition Plan:  Status is: Inpatient Remains inpatient appropriate because: IV treatment. W/u pending, still unable to move Left leg.monitoring renal function                LOS: 22 days   Time spent: 35 min    Nolberto Hanlon, MD [facility] Pager [phone]  If 7PM-7AM, please contact night-coverage 06/15/2021, 9:44 AM

## 2021-06-15 NOTE — Progress Notes (Signed)
[facility]   Reason for visit: follow up on fever, psoas abscess/hematoma  Interval History: no drainable area from the psoas area.  He continues to complain of pain from his pelvis down bilateral.  He continues to report he is unable to move his left leg at all.  Urine output improving. Fever curve trending down since cefepime was added.  Day 22 total antibiotics  Physical Exam: Constitutional:  Vitals:   06/15/21 0510 06/15/21 0929  BP: 140/90 138/88  Pulse: (!) 113 (!) 116  Resp: 18 18  Temp: 99.1 F (37.3 C) 98.2 F (36.8 C)  SpO2: 99% 98%  He is in nad Respiratory: normal respiratory effort Cardiovascular: RRR GI: soft MS: significant pain to touch of his leg, no movement  Review of Systems: Gastrointestinal: negative for nausea or diarrhea Integument/breast: negative for rash  Lab Results  Component Value Date   WBC 9.4 06/15/2021   HGB 8.2 (L) 06/15/2021   HCT 26.7 (L) 06/15/2021   MCV 85.3 06/15/2021   PLT 650 (H) 06/15/2021    Lab Results  Component Value Date   CREATININE 5.40 (H) 06/15/2021   BUN 50 (H) 06/15/2021   NA 132 (L) 06/15/2021   K 3.6 06/15/2021   CL 97 (L) 06/15/2021   CO2 24 06/15/2021    Lab Results  Component Value Date   ALT 16 06/08/2021   AST 25 06/08/2021   ALKPHOS 148 (H) 06/08/2021     Microbiology: Recent Results (from the past 240 hour(s))  Culture, blood (routine x 2)     Status: None   Collection Time: 06/07/21 10:14 AM   Specimen: BLOOD  Result Value Ref Range Status   Specimen Description BLOOD RIGHT ANTECUBITAL  Final   Special Requests   Final    BOTTLES DRAWN AEROBIC ONLY Blood Culture adequate volume   Culture   Final    NO GROWTH 5 DAYS Performed at [facility], [address] [place], [place] [address]    Report Status 06/12/2021 FINAL  Final  Culture, blood (routine x 2)     Status: None   Collection Time: 06/07/21 10:17 AM   Specimen: BLOOD RIGHT HAND  Result  Value Ref Range Status   Specimen Description BLOOD RIGHT HAND  Final   Special Requests   Final    BOTTLES DRAWN AEROBIC ONLY Blood Culture adequate volume   Culture   Final    NO GROWTH 5 DAYS Performed at [facility], [address] [place], [place] [address]    Report Status 06/12/2021 FINAL  Final  Gastrointestinal Panel by PCR , Stool     Status: None   Collection Time: 06/07/21  8:38 PM   Specimen: STOOL  Result Value Ref Range Status   Campylobacter species NOT DETECTED NOT DETECTED Final   Plesimonas shigelloides NOT DETECTED NOT DETECTED Final   Salmonella species NOT DETECTED NOT DETECTED Final   Yersinia enterocolitica NOT DETECTED NOT DETECTED Final   Vibrio species NOT DETECTED NOT DETECTED Final   Vibrio cholerae NOT DETECTED NOT DETECTED Final   Enteroaggregative E coli (EAEC) NOT DETECTED NOT DETECTED Final   Enteropathogenic E coli (EPEC) NOT DETECTED NOT DETECTED Final   Enterotoxigenic E coli (ETEC) NOT DETECTED NOT DETECTED Final   Shiga like toxin producing E coli (STEC) NOT DETECTED NOT DETECTED Final   Shigella/Enteroinvasive E coli (EIEC) NOT DETECTED NOT DETECTED Final   Cryptosporidium NOT DETECTED NOT DETECTED Final  Cyclospora cayetanensis NOT DETECTED NOT DETECTED Final   Entamoeba histolytica NOT DETECTED NOT DETECTED Final   Giardia lamblia NOT DETECTED NOT DETECTED Final   Adenovirus F40/41 NOT DETECTED NOT DETECTED Final   Astrovirus NOT DETECTED NOT DETECTED Final   Norovirus GI/GII NOT DETECTED NOT DETECTED Final   Rotavirus A NOT DETECTED NOT DETECTED Final   Sapovirus (I, II, IV, and V) NOT DETECTED NOT DETECTED Final    Comment: Performed at [facility], [address], [place], [place] [address]  C Difficile Quick Screen (NO PCR Reflex)     Status: None   Collection Time: 06/07/21  8:38 PM   Specimen: STOOL  Result Value Ref Range Status   C Diff antigen NEGATIVE NEGATIVE Final   C Diff toxin NEGATIVE NEGATIVE Final    C Diff interpretation No C. difficile detected.  Final    Comment: Performed at [facility], [address] [place], [place] [address]  Urine Culture     Status: Abnormal   Collection Time: 06/10/21  6:37 PM   Specimen: Urine, Clean Catch  Result Value Ref Range Status   Specimen Description URINE, CLEAN CATCH  Final   Special Requests   Final    NONE Performed at [facility], [address], [place], [place] [address]    Culture 20,000 COLONIES/mL PSEUDOMONAS AERUGINOSA (A)  Final   Report Status 06/13/2021 FINAL  Final   Organism ID, Bacteria PSEUDOMONAS AERUGINOSA (A)  Final      Susceptibility   Pseudomonas aeruginosa - MIC*    CEFTAZIDIME 4 SENSITIVE Sensitive     CIPROFLOXACIN <=0.25 SENSITIVE Sensitive     GENTAMICIN <=1 SENSITIVE Sensitive     IMIPENEM 1 SENSITIVE Sensitive     PIP/TAZO 8 SENSITIVE Sensitive     CEFEPIME 2 SENSITIVE Sensitive     * 20,000 COLONIES/mL PSEUDOMONAS AERUGINOSA  Culture, blood (routine x 2)     Status: None   Collection Time: 06/10/21  9:02 PM   Specimen: BLOOD  Result Value Ref Range Status   Specimen Description BLOOD LEFT ANTECUBITAL  Final   Special Requests   Final    BOTTLES DRAWN AEROBIC AND ANAEROBIC Blood Culture adequate volume   Culture   Final    NO GROWTH 5 DAYS Performed at [facility], [address] [place], [place] [address]    Report Status 06/15/2021 FINAL  Final  Culture, blood (routine x 2)     Status: None   Collection Time: 06/10/21  9:02 PM   Specimen: BLOOD  Result Value Ref Range Status   Specimen Description BLOOD RIGHT ANTECUBITAL  Final   Special Requests   Final    BOTTLES DRAWN AEROBIC AND ANAEROBIC Blood Culture results may not be optimal due to an inadequate volume of blood received in culture bottles   Culture   Final    NO GROWTH 5 DAYS Performed at [facility], [address], [place], [place] [address]    Report Status 06/15/2021 FINAL  Final  Resp Panel by RT-PCR  (Flu A&B, Covid) Nasopharyngeal Swab     Status: None   Collection Time: 06/14/21 10:16 AM   Specimen: Nasopharyngeal Swab; Nasopharyngeal(NP) swabs in vial transport medium  Result Value Ref Range Status   SARS Coronavirus 2 by RT PCR NEGATIVE NEGATIVE Final    Comment: (NOTE) SARS-CoV-2 target nucleic acids are NOT DETECTED.  The SARS-CoV-2 RNA is generally detectable in upper respiratory specimens  during the acute phase of infection. The lowest concentration of SARS-CoV-2 viral copies this assay can detect is 138 copies/mL. A negative result does not preclude SARS-Cov-2 infection and should not be used as the sole basis for treatment or other patient management decisions. A negative result may occur with  improper specimen collection/handling, submission of specimen other than nasopharyngeal swab, presence of viral mutation(s) within the areas targeted by this assay, and inadequate number of viral copies(<138 copies/mL). A negative result must be combined with clinical observations, patient history, and epidemiological information. The expected result is Negative.  Fact Sheet for Patients:  BloggerCourse.com  Fact Sheet for Healthcare Providers:  SeriousBroker.it  This test is no t yet approved or cleared by the [place] FDA and  has been authorized for detection and/or diagnosis of SARS-CoV-2 by FDA under an Emergency Use Authorization (EUA). This EUA will remain  in effect (meaning this test can be used) for the duration of the COVID-19 declaration under Section 564(b)(1) of the Act, 21 U.S.C.section 360bbb-3(b)(1), unless the authorization is terminated  or revoked sooner.       Influenza A by PCR NEGATIVE NEGATIVE Final   Influenza B by PCR NEGATIVE NEGATIVE Final    Comment: (NOTE) The Xpert Xpress SARS-CoV-2/FLU/RSV plus assay is intended as an aid in the diagnosis of influenza from Nasopharyngeal swab specimens  and should not be used as a sole basis for treatment. Nasal washings and aspirates are unacceptable for Xpert Xpress SARS-CoV-2/FLU/RSV testing.  Fact Sheet for Patients: BloggerCourse.com  Fact Sheet for Healthcare Providers: SeriousBroker.it  This test is not yet approved or cleared by the [place] FDA and has been authorized for detection and/or diagnosis of SARS-CoV-2 by FDA under an Emergency Use Authorization (EUA). This EUA will remain in effect (meaning this test can be used) for the duration of the COVID-19 declaration under Section 564(b)(1) of the Act, 21 U.S.C. section 360bbb-3(b)(1), unless the authorization is terminated or revoked.  Performed at [facility], [address], [place], [place] [address]     Impression/Plan:  1. Psoas hematoma/abscess - nothing drainable and overall his fever curve and leukocytosis is better since cefepime was added.   Will continue with antibiotics  2.  Medication monitoring -  last CK wnl at 131 and will continue to monitor on daptomycin.  3.  Leukocytosis - WBC now wnl.  Will continue to monitor.  4.  Acute renal insufficiency - continued improvement of his creat and urine output.  Nephrology holding on dialysis for now and monitoring for renal recovery.

## 2021-06-15 NOTE — Progress Notes (Signed)
Physical Therapy Treatment Patient Details Name: Andres Braun MRN: [id number] DOB: 12/21/1975 Today's Date: 06/15/2021   History of Present Illness 46 y.o. male admitted 05/24/21 after cardiac arrest at home; pt with agonal breathing, lost pulse, required 20-min CPR and 5 rounds epi. Workup for acute hypoxic respiratory failure, septic shock due to undertermined organism, AKI, acute metabolic encephalopathy, aspiration PNA of bilateral lower lobes due to vomit. ETT 1/15-1/19. CRRT 1/15-1/20. Brain MRI 1/22 negative. Cervical MRI 1/22 with small R-side disc protrusion C5-6. Abdominal CT showed muscular swelling, particularly iliopsoas and gluteal muscles consistent with rhabdomyolysis; hemorrhage noted within R iliopsoas. S/p diagnostic laparoscopy 1/24. MRI L femur 1/25 showed diffuse muscular abnormalities bilateral thighs with heterogenous T2 signal, especially within L glut max and gradratus femoris; findings compatible with dx of rhabdomyolysis, no drainable abscess identified; these findings could contribute to compartment syndrome. Unknown PMH.    PT Comments    Pt initially difficult to motivate to participate in mobility, perseverative on getting "treatment" for his L hip and side pain. Once agreeable to mobility, pt tolerated repeated sit<>stands with mod assist and cuing for form and technique, complains of LLE pain and weakness throughout session. Pt instructed in LLE exercises once up in chair, per pt he cannot contract any musculature LLE, some muscular activation observed during session but limited especially quad. PT to continue to follow and will progress mobility as tolerated.    Recommendations for follow up therapy are one component of a multi-disciplinary discharge planning process, led by the attending physician.  Recommendations may be updated based on patient status, additional functional criteria and insurance authorization.  Follow Up Recommendations  Skilled nursing-short  term rehab (<3 hours/day)     Assistance Recommended at Discharge Frequent or constant Supervision/Assistance  Patient can return home with the following Help with stairs or ramp for entrance;A lot of help with bathing/dressing/bathroom;Assistance with cooking/housework;Assist for transportation;Direct supervision/assist for financial management;Direct supervision/assist for medications management;A lot of help with walking and/or transfers   Equipment Recommendations  Wheelchair (measurements PT);Wheelchair cushion (measurements PT);Other (comment);Hospital bed;BSC/3in1    Recommendations for Other Services       Precautions / Restrictions Precautions Precautions: Fall Restrictions Weight Bearing Restrictions: No     Mobility  Bed Mobility Overal bed mobility: Needs Assistance Bed Mobility: Supine to Sit Rolling: Mod assist Sidelying to sit: Mod assist, HOB elevated, +2 for physical assistance       General bed mobility comments: assist for LE translation to EOB, trunk elevation, and steadying once sitting. Complaining of significant LLE and hip pain during mobility.    Transfers Overall transfer level: Needs assistance Equipment used: Ambulation equipment used Transfers: Sit to/from Stand Sit to Stand: Mod assist, +2 physical assistance, From elevated surface           General transfer comment: mod +2 for power up, hip extension via posterior facilitation, and truncal tactile cuing for upright. Max cues for sequencing, hand placement when rising/sitting, slow eccentric sit. STS x4 from stedy, x1 from recliner.    Ambulation/Gait               General Gait Details: nt   Social research officer, government Rankin (Stroke Patients Only)       Balance Overall balance assessment: Needs assistance Sitting-balance support: Feet supported, Bilateral upper extremity supported Sitting balance-Leahy Scale: Fair     Standing balance  support: Bilateral upper extremity supported  Standing balance-Leahy Scale: Poor                              Cognition Arousal/Alertness: Awake/alert Behavior During Therapy: Flat affect Overall Cognitive Status: Impaired/Different from baseline Area of Impairment: Attention, Memory, Following commands, Safety/judgement, Awareness, Problem solving                   Current Attention Level: Sustained Memory: Decreased recall of precautions, Decreased short-term memory Following Commands: Follows one step commands with increased time Safety/Judgement: Decreased awareness of safety, Decreased awareness of deficits Awareness: Intellectual Problem Solving: Slow processing, Difficulty sequencing General Comments: Pt lacking understanding into deficits and diagnoses, states "my kidneys are fine, I am peeing, I can be off dialysis" and does not understand reason for L hip and flank pain. Pt requires cues to stay on task, at times requires mod encouragement to participate given pain. Ashby Dawes, in-person interpreter, utilized during session.        Exercises General Exercises - Lower Extremity Long Arc Quad: Left, 10 reps, Seated, PROM Hip Flexion/Marching: AAROM, Left, 10 reps, Seated    General Comments        Pertinent Vitals/Pain Pain Assessment Pain Assessment: Faces Faces Pain Scale: Hurts little more Pain Location: bilateral LE (L>R), L flank and hip Pain Descriptors / Indicators: Grimacing, Sharp, Guarding Pain Intervention(s): Limited activity within patient's tolerance, Monitored during session, Repositioned    Home Living                          Prior Function            PT Goals (current goals can now be found in the care plan section) Acute Rehab PT Goals Patient Stated Goal: to do more PT Goal Formulation: With patient Time For Goal Achievement: 06/12/21 Potential to Achieve Goals: Fair Progress towards PT goals: Progressing toward  goals    Frequency    Min 3X/week      PT Plan Current plan remains appropriate    Co-evaluation PT/OT/SLP Co-Evaluation/Treatment: Yes Reason for Co-Treatment: To address functional/ADL transfers;For patient/therapist safety;Complexity of the patient's impairments (multi-system involvement) PT goals addressed during session: Mobility/safety with mobility;Balance;Strengthening/ROM        AM-PAC PT "6 Clicks" Mobility   Outcome Measure  Help needed turning from your back to your side while in a flat bed without using bedrails?: A Lot Help needed moving from lying on your back to sitting on the side of a flat bed without using bedrails?: A Lot Help needed moving to and from a bed to a chair (including a wheelchair)?: A Lot Help needed standing up from a chair using your arms (e.g., wheelchair or bedside chair)?: A Lot Help needed to walk in hospital room?: Total Help needed climbing 3-5 steps with a railing? : Total 6 Click Score: 10    End of Session Equipment Utilized During Treatment: Gait belt Activity Tolerance: Patient limited by pain Patient left: in chair;with chair alarm set;with call bell/phone within reach Nurse Communication: Mobility status PT Visit Diagnosis: Muscle weakness (generalized) (M62.81);Difficulty in walking, not elsewhere classified (R26.2);Unsteadiness on feet (R26.81)     Time: 2542-7062 PT Time Calculation (min) (ACUTE ONLY): 37 min  Charges:  $Therapeutic Activity: 8-22 mins                    Zia Kanner S, PT DPT Acute Rehabilitation Services Pager [phone]  Office [phone]    Truddie Coco 06/15/2021, 2:37 PM

## 2021-06-16 LAB — RENAL FUNCTION PANEL
Albumin: 2.2 g/dL — ABNORMAL LOW (ref 3.5–5.0)
Anion gap: 10 (ref 5–15)
BUN: 57 mg/dL — ABNORMAL HIGH (ref 6–20)
CO2: 24 mmol/L (ref 22–32)
Calcium: 8.4 mg/dL — ABNORMAL LOW (ref 8.9–10.3)
Chloride: 102 mmol/L (ref 98–111)
Creatinine, Ser: 5.13 mg/dL — ABNORMAL HIGH (ref 0.61–1.24)
GFR, Estimated: 13 mL/min — ABNORMAL LOW (ref >60)
Glucose, Bld: 100 mg/dL — ABNORMAL HIGH (ref 70–99)
Phosphorus: 4.6 mg/dL (ref 2.5–4.6)
Potassium: 3.5 mmol/L (ref 3.5–5.1)
Sodium: 136 mmol/L (ref 135–145)

## 2021-06-16 LAB — CK: Total CK: 88 U/L (ref 49–397)

## 2021-06-16 MED ORDER — SODIUM CHLORIDE 0.9 % IV SOLN: INTRAVENOUS | Status: AC

## 2021-06-16 NOTE — Progress Notes (Signed)
Nutrition Follow-up  DOCUMENTATION CODES:   Severe malnutrition in context of acute illness/injury  INTERVENTION:  -continue Double protein portions with meals -continue  Nepro Shake po daily, each supplement provides 425 kcal and 19 grams protein  -continue 30 ml Prosource Plus BID, each supplement provides 100 kcals and 15 grams protein -continue Renal MVI daily  NUTRITION DIAGNOSIS:   Severe Malnutrition related to acute illness (cardiac arrest, AKI, ileus) as evidenced by energy intake < or equal to 50% for > or equal to 5 days, percent weight loss (7.4% weight loss in less than 2 weeks).  ongoing  GOAL:   Patient will meet greater than or equal to 90% of their needs  progressing  MONITOR:   PO intake, Supplement acceptance, Diet advancement, Labs, Weight trends  REASON FOR ASSESSMENT:   Ventilator, Consult Enteral/tube feeding initiation and management  ASSESSMENT:   46 yo male admitted post OOH cardiac arrest, acute respiratory failure post arrest, possible aspiration-intubated, AKI requiring CRRT. No PMH on file. Pt is spanish speaking  01/15 - admitted, intubated, CRRT initiated 01/16 - TTM initiated 01/19 - extubated, CRRT held 01/20 - diet advanced to Regular 01/21 - NPO due to concern for bowel ischemia 01/22 - full liquid diet, first HD treatment 01/23 - NPO 01/24 - s/p diagnostic laparoscopy revealing viable but thickened bowel without obstruction, second HD treatment 01/25 - clear liquid diet 1/26- advanced to soft diet, third HD treatment  Pt's hospital course has been complicated by unexplained LLE weakness. Neurology consulted and feel it is not r/t neurological injury as the neuraxis is negative. IR tried drainage but did not have much luck.  Nephrology continues to follow and assessing pt's need for HD daily. UOP improving. Last iHD was 2/04 with net UF.   PO Intake: 0-75% x 6 recorded meals (39% avg meal intake)  UOP: x24  hours I/O: - since admit  Current weight: 83.2 kg Admit weight: 93.5 kg   Medications: 45ml Prosource Plus BID, Phoslo, Aranesp, Nepro Carb Steady daily, Folvite, Rena-vit, Protonix, IV abx Labs reviewed.    Diet Order:   Diet Order             DIET SOFT Room service appropriate? Yes; Fluid consistency: Thin  Diet effective now                   EDUCATION NEEDS:   Not appropriate for education at this time  Skin:  Skin Assessment: Skin Integrity Issues: Skin Integrity Issues:: Incisions, Other (Comment) Incisions: abdomen Other: rt knee puncture, sternal burn due to defibrilation pads  Last BM:  2/6  Height:   Ht Readings from Last 1 Encounters:  06/02/21 6' (1.829 m)    Weight:   Wt Readings from Last 1 Encounters:  06/14/21 83.2 kg    BMI:  Body mass index is 24.88 kg/m.  Estimated Nutritional Needs:   Kcal:  2200-2400  Protein:  120-135 grams  Fluid:  1000 ml + UOP     Edeline Greening A., MS, RD, LDN (she/her/hers) RD pager number and weekend/on-call pager number located in Amion.

## 2021-06-16 NOTE — Progress Notes (Signed)
PROGRESS NOTE    Andres Braun  WUJ:[id number] DOB: 1975/08/24 DOA: 05/24/2021 PCP: Merryl Hacker, No    Brief Narrative:  46 year old male admitted to ICU 1/15 under PCCM care after cardiac arrest at home.  PEA arrest, s/p CPR x20 minutes and epi x5.  Patient reportedly out drinking with his friends the night prior to admission.  Found by friends with agonal breathing.  Intubated on arrival, needed vasopressors, noted to have renal failure, rhabdomyolysis with CK in the 14 K range, treated for aspiration pneumonia, nephrology consulted and started on CRRT.  Extubated 1/19 and off pressors.  Mental status slowly improved.  Transferred to telemetry.  Care transferred to [facility] on 1/24.  Nephrology following for HD needs.  General surgery on board for questionable small bowel ischemia and taken emergently to surgery 1/24. The bowel in question was found to be thickened, but viable. Hospital course complicated by unexplained left lower extremity weakness. Extensive work up of the neuraxis is negative for compressive pathology. Differential is probably a psoas hematoma causing lumbar plexus compression/ irritation leading to secondary weakness. IR suggested that it is not amenable for aspiration or drain placement. Neurology consulted, suggested not a neurological injury as the neuraxis is negative.  IR tried drainage but not much luck. Urine output improving.           Consultants:  Nephrology , ID,pccm, surgery neurology  Procedures:   Antimicrobials:     Subjective: Sleep this am. No new complaints. No sob, cp  Objective: Vitals:   06/15/21 1820 06/15/21 2044 06/16/21 0355 06/16/21 0800  BP: 134/86 (!) 157/87 (!) 133/95 (!) 142/95  Pulse: (!) 114 (!) 115 (!) 117   Resp: _0 Temp: 98.8 F (37.1 C) 98.4 F (36.9 C) 98.3 F (36.8 C) 98.6 F (37 C)  TempSrc: Oral  Oral Oral  SpO2: 100% 100% 100%   Weight:      Height:        Intake/Output Summary (Last 24 hours) at 06/16/2021  1109 Last data filed at 06/16/2021 9562 Gross per 24 hour  Intake 1189.83 ml  Output 3450 ml  Net -2260.17 ml   Filed Weights   06/10/21 1124 06/13/21 0506 06/14/21 0536  Weight: 85.5 kg 86.8 kg 83.2 kg    Examination: Calm, NAD Cta no w/r Reg s1/s2 no gallop Soft benign +bs Mild edema b/l Aaoxox3  Mood and affect appropriate in current setting    Data Reviewed: I have personally reviewed following labs and imaging studies  CBC: Recent Labs  Lab 06/10/21 0405 06/11/21 0145 06/13/21 0830 06/14/21 1125 06/15/21 0842  WBC 17.3* 13.1* 12.0* 10.0 9.4  NEUTROABS 14.0*  --   --   --   --   HGB 7.4* 7.4* 7.2* 6.7* 8.2*  HCT 22.6* 23.5* 22.7* 22.2* 26.7*  MCV 85.3 88.3 86.3 88.4 85.3  PLT 667* 693* 696* 637* 130*   Basic Metabolic Panel: Recent Labs  Lab 06/12/21 0450 06/13/21 0401 06/14/21 0048 06/15/21 0337 06/16/21 0344  NA 131* 131* 136 132* 136  K 4.7 3.9 3.5 3.6 3.5  CL 97* 95* 99 97* 102  CO2 _1 GLUCOSE 105* 99 134* 95 100*  BUN 55* 70* 40* 50* 57*  CREATININE 7.86* 8.34* 4.94* 5.40* 5.13*  CALCIUM 8.0* 8.1* 7.9* 8.2* 8.4*  PHOS 7.0* 7.3* 4.1 5.0* 4.6   GFR: Estimated Creatinine Clearance: 20 mL/min (A) (by C-G formula based on SCr of 5.13 mg/dL (H)). Liver  Function Tests: Recent Labs  Lab 06/12/21 0450 06/13/21 0401 06/14/21 0048 06/15/21 0337 06/16/21 0344  ALBUMIN 1.6* 1.5* <1.5* 1.6* 2.2*   No results for input(s): LIPASE, AMYLASE in the last 168 hours. No results for input(s): AMMONIA in the last 168 hours. Coagulation Profile: No results for input(s): INR, PROTIME in the last 168 hours. Cardiac Enzymes: Recent Labs  Lab 06/11/21 0145 06/16/21 0344  CKTOTAL 131 88   BNP (last 3 results) No results for input(s): PROBNP in the last 8760 hours. HbA1C: No results for input(s): HGBA1C in the last 72 hours. CBG: No results for input(s): GLUCAP in the last 168 hours. Lipid Profile: No results for input(s): CHOL, HDL,  LDLCALC, TRIG, CHOLHDL, LDLDIRECT in the last 72 hours. Thyroid Function Tests: No results for input(s): TSH, T4TOTAL, FREET4, T3FREE, THYROIDAB in the last 72 hours. Anemia Panel: No results for input(s): VITAMINB12, FOLATE, FERRITIN, TIBC, IRON, RETICCTPCT in the last 72 hours. Sepsis Labs: No results for input(s): PROCALCITON, LATICACIDVEN in the last 168 hours.   Recent Results (from the past 240 hour(s))  Culture, blood (routine x 2)     Status: None   Collection Time: 06/07/21 10:14 AM   Specimen: BLOOD  Result Value Ref Range Status   Specimen Description BLOOD RIGHT ANTECUBITAL  Final   Special Requests   Final    BOTTLES DRAWN AEROBIC ONLY Blood Culture adequate volume   Culture   Final    NO GROWTH 5 DAYS Performed at [facility], [address], [place], [place] [address]    Report Status 06/12/2021 FINAL  Final  Culture, blood (routine x 2)     Status: None   Collection Time: 06/07/21 10:17 AM   Specimen: BLOOD RIGHT HAND  Result Value Ref Range Status   Specimen Description BLOOD RIGHT HAND  Final   Special Requests   Final    BOTTLES DRAWN AEROBIC ONLY Blood Culture adequate volume   Culture   Final    NO GROWTH 5 DAYS Performed at [facility], [place] [address] [place], [place] [address]    Report Status 06/12/2021 FINAL  Final  Gastrointestinal Panel by PCR , Stool     Status: None   Collection Time: 06/07/21  8:38 PM   Specimen: STOOL  Result Value Ref Range Status   Campylobacter species NOT DETECTED NOT DETECTED Final   Plesimonas shigelloides NOT DETECTED NOT DETECTED Final   Salmonella species NOT DETECTED NOT DETECTED Final   Yersinia enterocolitica NOT DETECTED NOT DETECTED Final   Vibrio species NOT DETECTED NOT DETECTED Final   Vibrio cholerae NOT DETECTED NOT DETECTED Final   Enteroaggregative E coli (EAEC) NOT DETECTED NOT DETECTED Final   Enteropathogenic E coli (EPEC) NOT DETECTED NOT DETECTED Final   Enterotoxigenic E coli (ETEC)  NOT DETECTED NOT DETECTED Final   Shiga like toxin producing E coli (STEC) NOT DETECTED NOT DETECTED Final   Shigella/Enteroinvasive E coli (EIEC) NOT DETECTED NOT DETECTED Final   Cryptosporidium NOT DETECTED NOT DETECTED Final   Cyclospora cayetanensis NOT DETECTED NOT DETECTED Final   Entamoeba histolytica NOT DETECTED NOT DETECTED Final   Giardia lamblia NOT DETECTED NOT DETECTED Final   Adenovirus F40/41 NOT DETECTED NOT DETECTED Final   Astrovirus NOT DETECTED NOT DETECTED Final   Norovirus GI/GII NOT DETECTED NOT DETECTED Final   Rotavirus A NOT DETECTED NOT DETECTED Final   Sapovirus (I, II, IV, and V) NOT DETECTED NOT DETECTED Final    Comment:  Performed at [facility], [place], [place] [address]  C Difficile Quick Screen (NO PCR Reflex)     Status: None   Collection Time: 06/07/21  8:38 PM   Specimen: STOOL  Result Value Ref Range Status   C Diff antigen NEGATIVE NEGATIVE Final   C Diff toxin NEGATIVE NEGATIVE Final   C Diff interpretation No C. difficile detected.  Final    Comment: Performed at [facility], [place] [address] [place], [place] [address]  Urine Culture     Status: Abnormal   Collection Time: 06/10/21  6:37 PM   Specimen: Urine, Clean Catch  Result Value Ref Range Status   Specimen Description URINE, CLEAN CATCH  Final   Special Requests   Final    NONE Performed at [facility], [place] [address], [place], [place] [address]    Culture 20,000 COLONIES/mL PSEUDOMONAS AERUGINOSA (A)  Final   Report Status 06/13/2021 FINAL  Final   Organism ID, Bacteria PSEUDOMONAS AERUGINOSA (A)  Final      Susceptibility   Pseudomonas aeruginosa - MIC*    CEFTAZIDIME 4 SENSITIVE Sensitive     CIPROFLOXACIN <=0.25 SENSITIVE Sensitive     GENTAMICIN <=1 SENSITIVE Sensitive     IMIPENEM 1 SENSITIVE Sensitive     PIP/TAZO 8 SENSITIVE Sensitive     CEFEPIME 2 SENSITIVE Sensitive     * 20,000 COLONIES/mL PSEUDOMONAS AERUGINOSA  Culture,  blood (routine x 2)     Status: None   Collection Time: 06/10/21  9:02 PM   Specimen: BLOOD  Result Value Ref Range Status   Specimen Description BLOOD LEFT ANTECUBITAL  Final   Special Requests   Final    BOTTLES DRAWN AEROBIC AND ANAEROBIC Blood Culture adequate volume   Culture   Final    NO GROWTH 5 DAYS Performed at [facility], [place] [address] [place], [place] [address]    Report Status 06/15/2021 FINAL  Final  Culture, blood (routine x 2)     Status: None   Collection Time: 06/10/21  9:02 PM   Specimen: BLOOD  Result Value Ref Range Status   Specimen Description BLOOD RIGHT ANTECUBITAL  Final   Special Requests   Final    BOTTLES DRAWN AEROBIC AND ANAEROBIC Blood Culture results may not be optimal due to an inadequate volume of blood received in culture bottles   Culture   Final    NO GROWTH 5 DAYS Performed at [facility], [place] [address] [place], [place] [address]    Report Status 06/15/2021 FINAL  Final  Resp Panel by RT-PCR (Flu A&B, Covid) Nasopharyngeal Swab     Status: None   Collection Time: 06/14/21 10:16 AM   Specimen: Nasopharyngeal Swab; Nasopharyngeal(NP) swabs in vial transport medium  Result Value Ref Range Status   SARS Coronavirus 2 by RT PCR NEGATIVE NEGATIVE Final    Comment: (NOTE) SARS-CoV-2 target nucleic acids are NOT DETECTED.  The SARS-CoV-2 RNA is generally detectable in upper respiratory specimens during the acute phase of infection. The lowest concentration of SARS-CoV-2 viral copies this assay can detect is 138 copies/mL. A negative result does not preclude SARS-Cov-2 infection and should not be used as the sole basis for treatment or other patient management decisions. A negative result may occur with  improper specimen collection/handling, submission of specimen other than nasopharyngeal swab, presence of viral mutation(s) within the areas targeted by this assay, and inadequate number of viral copies(<138  copies/mL). A  negative result must be combined with clinical observations, patient history, and epidemiological information. The expected result is Negative.  Fact Sheet for Patients:  EntrepreneurPulse.com.au  Fact Sheet for Healthcare Providers:  IncredibleEmployment.be  This test is no t yet approved or cleared by the [place] FDA and  has been authorized for detection and/or diagnosis of SARS-CoV-2 by FDA under an Emergency Use Authorization (EUA). This EUA will remain  in effect (meaning this test can be used) for the duration of the COVID-19 declaration under Section 564(b)(1) of the Act, 21 U.S.C.section 360bbb-3(b)(1), unless the authorization is terminated  or revoked sooner.       Influenza A by PCR NEGATIVE NEGATIVE Final   Influenza B by PCR NEGATIVE NEGATIVE Final    Comment: (NOTE) The Xpert Xpress SARS-CoV-2/FLU/RSV plus assay is intended as an aid in the diagnosis of influenza from Nasopharyngeal swab specimens and should not be used as a sole basis for treatment. Nasal washings and aspirates are unacceptable for Xpert Xpress SARS-CoV-2/FLU/RSV testing.  Fact Sheet for Patients: EntrepreneurPulse.com.au  Fact Sheet for Healthcare Providers: IncredibleEmployment.be  This test is not yet approved or cleared by the [place] FDA and has been authorized for detection and/or diagnosis of SARS-CoV-2 by FDA under an Emergency Use Authorization (EUA). This EUA will remain in effect (meaning this test can be used) for the duration of the COVID-19 declaration under Section 564(b)(1) of the Act, 21 U.S.C. section 360bbb-3(b)(1), unless the authorization is terminated or revoked.  Performed at [facility], [place] [address], [place], [place] [address]          Radiology Studies: No results found.      Scheduled Meds:  (feeding supplement) PROSource Plus  30 mL Oral BID BM   calcium  acetate  1,334 mg Oral TID WC   Chlorhexidine Gluconate Cloth  6 each Topical Q0600   darbepoetin (ARANESP) injection - NON-DIALYSIS  150 mcg Subcutaneous Q Mon-1800   feeding supplement (NEPRO CARB STEADY)  237 mL Oral QHS   folic acid  1 mg Per Tube Daily   Gerhardt's butt cream   Topical TID   heparin injection (subcutaneous)  5,000 Units Subcutaneous Q8H   multivitamin  1 tablet Oral QHS   pantoprazole  40 mg Oral BID   pregabalin  75 mg Oral QHS   Continuous Infusions:  sodium chloride Stopped (05/29/21 1602)   sodium chloride     ceFEPime (MAXIPIME) IV 1 g (06/16/21 0831)   DAPTOmycin (CUBICIN)  IV 700 mg (06/14/21 2206)   ferric gluconate (FERRLECIT) IVPB 250 mg (06/16/21 0958)    Assessment & Plan:   Principal Problem:   Cardiac arrest with pulseless electrical activity ([place]) Active Problems:   AKI (acute kidney injury) ([place])   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[place])   Lower extremity weakness with left foot drop   Thrombocytosis   Hyponatremia   Acute posthemorrhagic anemia   Elevated liver enzymes   Acute respiratory failure with hypoxia and hypercapnia (HCC)   Septic shock due to undetermined organism ([place])   Acute metabolic encephalopathy   Aspiration pneumonia of both lower lobes due to vomit ([place])   Protein-calorie malnutrition, severe   Ischemic enteritis versus colitis with ileus   right anterior knee wound   Mechanical fall 1/24 and hit his head   Cardiac arrest with pulseless electrical activity ([place]) S/p PEA arrest, s/p CPR x 20 minutes and Epi x 5. 2/7 now stable  AKI (acute kidney injury) ([place])- (present on admission) with metabolic acidosis/rhabdomyolysis Patient has been off CRRT 1/15-1/19/2023. Nephrology on board and appreciate recommendations.  Hoping for renal recovery, if no recovery will need a perm cath next week.   Improving CK levels. 2/2 intermittent HD now on MWF schedule. Hold off on permanent access creation since febrile, waiting for  clearance fo rthis to be placed 2/3 will receive IV ct contrast for IR procedure, needs close monitoring. Nephrology aware 2/7 normal saline at 75 MLS per hour x12 hours ordered for today by nephrology  Creatinine improved mildly, no HD today given stable creatinine and good urine output  Continue monitoring labs  Maintain MAP above 65 for optimal renal perfusion  Received albumin x1 2/6 Avoid nephrotoxic meds including NSAIDs and Vanco/Zosyn combo  Last fever 102.2/4      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HCC) versus hematoma Blood cultures negative to date Leukocytosis improving since cefepime was added ID following Continue with current antibiotics cefepime and daptomycin   +UCX Asymptomatic  Minimally positive, per ID no treatment required    Hyponatremia Due to volume overload 2/7 improved with HD  Today has improved  Continue to monitor      Thrombocytosis Reactive or from infection   Lower extremity weakness with left foot drop MRI of the neuraxis  was unremarkable for acute process .  As per the MRI of the lumbar spine, multiple fluid collections in the right ileo psoas muscle.  Dr Radene Gunning spoke with neurology and neuro surgery on 1/24, no surgical cause for his lower extremity weakness. He suggested that a psoas abscess or hematoma could cause lumbar plexus compression /irritation leading to secondary weakness.   IR consulted for aspiration, suggested any aspiration would predispose to infection/ not amenable for aspiration or drain placement. Did not advise aspiration.  MRI of the femur shows Diffuse muscular abnormalities throughout the visualized portions of both thighs with heterogeneous T2 signal and enhancement, especially within the left gluteus maximus and quadratus femoris muscles. Findings are compatible with the diagnosis of rhabdomyolysis, with the differential including myositis related to connective tissue disease and polymyositis. No drainable abscess identified. pt unable  to move the left lower extremity from severe pain and weakness.  Orthopedics consulted, suggested its a neurological/ lumbar plexus ischemic injury.  Hoping for recovery with therapy evaluations.  CK levels are improving. Vitamin b12 and folate levels wnl. ESR is 85, CRP is 5.4   Pt has good pulse in DP, no sensory deficits. No signs of compartment syndrome.  2/5 CIR pending 2/6 since still with pain will switch gabapentin to Lyrica if it improves pain 2/7 spoke to neurology Dr. Reeves Forth, recommended outpatient EMG/NCS.         Acute posthemorrhagic anemia possiblly from ?psoas hematoma Stool fob + on 1/21 , suspected from colitis s/p 1 unit prbc S/p iv iron 2/6 and today On Aranesp   Elevated liver enzymes Probably from ETOH abuse, shock liver and rhabdomyolysis.  Much improved   Mechanical fall 1/24 and hit his head -As per nursing report, prior to going to the OR, patient attempted to get out of the bed, fell and hit his head without any obvious external injuries or LOC.  Patient's friend was in the room. Advised nursing regarding strict fall precautions, placement of safety sitter if needed and monitor closely.   right anterior knee wound wound care consulted   Ischemic enteritis versus colitis with ileus General surgery consulted, there was concerned about an ischemic bowel.  Patient was taken to the OR for diagnostic laparoscopy, surgery revealed bowel in question is thickened,  no ischemia was seen.  hence no further intervention at this time.  Advance diet as tolerated without any issues at this time. Diarrhea improved with imodium.   C. difficile PCR and GI panel negative     Protein-calorie malnutrition, severe Supplement was added   Aspiration pneumonia of both lower lobes due to vomit ([place])- (present on admission) Completed course of abx   Acute metabolic encephalopathy- (present on admission) Resolved, at baseline   Septic shock due to undetermined organism  [facility])- (present on admission) Vital stable   Acute respiratory failure with hypoxia and hypercapnia ([place])- (present on admission) Patient intubated on 05/24/2021 and extubated on 05/28/2021 .  patient completed course of antibiotics for aspiration pneumonia  On RA       DVT prophylaxis: heparin Code Status:full Family Communication: none Disposition Plan:  Status is: Inpatient Remains inpatient appropriate because: IV treatment. still unable to move Left leg.monitoring renal function. Needs snf.                 LOS: 23 days   Time spent: 35 min    Nolberto Hanlon, MD [facility] Pager [phone]  If 7PM-7AM, please contact night-coverage 06/16/2021, 11:09 AM

## 2021-06-16 NOTE — Progress Notes (Addendum)
Nephrology Follow-Up note   Assessment/Recommendations: Andres Braun is a/an 46 y.o. male with no significant past medical history admitted to ICU 1/15 s/p cardiac arrest. Nephrology consulted 1/15 for acute renal failure and CRRT due to refractory metabolic acidosis, required CRRT 1/15-1/19, now on intermittent HD, last Saturday 2/04. Transferred to hospitalist service 1/24.   Acute renal failure vs severe AKI: In setting of cardiac arrest and rhabdomyolysis.  No documented PMH, but questionable CKD prior to this.  Definitive creatinine baseline difficult to elucidate, no prior measurements before admission.  CT abdomen pelvis 1/23 demonstrated bilateral enlarged kidneys c/w acute nephritis, no evidence of obstruction or lesion.  TDC currently on hold because of intermittent fevers (last 2/04).  We will continue to assess need for HD daily. - S/p albumin x1 2/6 - S/p NS at 75 mL/hr x 12 hours 2/6 - NS at 75 mL/hr x 12 hours ordered for today 2/7 - UOP 2650 mL last 24 hours (1.4 L > 3.3 L > 2.6 L today) - Cr stable since HD Saturday, modestly improved s/p NS noted above - Last fever 102.1*F 2/04 @ 0131 - No HD today given stable creatinine and good UOP - Continue to monitor daily Cr, Dose meds for GFR - Monitor Daily I/Os, Daily weight  - Maintain MAP>65 for optimal renal perfusion.  - Avoid nephrotoxic medications including NSAIDs and Vanc/Zosyn combo   Anemia: S/p transfusion 1/31. - Transfuse pRBCs PRN for Hgb<7 g/dL - S/p IV iron 2/6, plan for today as well - Increased Aranesp to 150 mcg weekly   Hyperphosphatemia: Improved with binders.   S/p cardiac arrest: Asystole likely secondary to aspiration pneumonia/ARDS in context of EtOH ingestion  Psoas hematoma and fevers: ID following, last fever 2/04.  Potential abscess amenable to drainage.  Cefepime added by ID for Pseudomonas in urine culture.   Possible ischemic bowel: Ex lap this admission negative for ischemic bowel, only  showed edema.   LE neuro deficits: Ortho, neurology, neurosurgery seen.  Possibly due to psoas hematoma above.   [facility] 06/16/2021 8:51 AM  ___________________________________________________________  CC: pain in legs today  Interval History/Subjective: Patient sleeping soundly upon entering the room. Awoke to tactile stimulation. C/o BLE pain, reports pain meds are not providing relief. Good UOP, feels a little less swollen. Discussed no HD today and need for daily re-evaluation to make HD determination.   Medications:  Current Facility-Administered Medications  Medication Dose Route Frequency Provider Last Rate Last Admin   (feeding supplement) PROSource Plus liquid 30 mL  30 mL Oral BID BM Kathlen Mody, MD   30 mL at 06/16/21 0834   0.9 %  sodium chloride infusion  250 mL Intravenous Continuous Emelia Loron, MD   Stopped at 05/29/21 1602   acetaminophen (TYLENOL) tablet 650 mg  650 mg Oral Q6H PRN Eduard Clos, MD   650 mg at 06/14/21 2155   albuterol (PROVENTIL) (2.5 MG/3ML) 0.083% nebulizer solution 2.5 mg  2.5 mg Nebulization Q2H PRN Emelia Loron, MD   2.5 mg at 05/24/21 1143   calcium acetate (PHOSLO) capsule 1,334 mg  1,334 mg Oral TID WC Annie Sable, MD   1,334 mg at 06/16/21 0823   ceFEPIme (MAXIPIME) 1 g in sodium chloride 0.9 % 100 mL IVPB  1 g Intravenous Q24H Darnell Level, MD 200 mL/hr at 06/16/21 0831 1 g at 06/16/21 0831   Chlorhexidine Gluconate Cloth 2 % PADS 6 each  6 each Topical Q0600 Annie Sable,  MD   6 each at 06/16/21 0539   DAPTOmycin (CUBICIN) 700 mg in sodium chloride 0.9 % IVPB  8 mg/kg Intravenous Q48H Lynn Ito, MD 128 mL/hr at 06/14/21 2206 700 mg at 06/14/21 2206   Darbepoetin Alfa (ARANESP) injection 150 mcg  150 mcg Subcutaneous Q Mon-1800 Estanislado Emms, MD   150 mcg at 06/15/21 1726   feeding supplement (NEPRO CARB STEADY) liquid 237 mL  237 mL Oral QHS Kathlen Mody, MD    237 mL at 06/12/21 2117   ferric gluconate (FERRLECIT) 250 mg in sodium chloride 0.9 % 250 mL IVPB  250 mg Intravenous Daily Estanislado Emms, MD 135 mL/hr at 06/15/21 1714 250 mg at 06/15/21 1714   folic acid (FOLVITE) tablet 1 mg  1 mg Per Tube Daily Emelia Loron, MD   1 mg at 06/16/21 9833   Gerhardt's butt cream   Topical TID Braun, Ava, DO   Given at 06/16/21 0836   guaiFENesin-dextromethorphan (ROBITUSSIN DM) 100-10 MG/5ML syrup 5 mL  5 mL Oral Q4H PRN Virl Diamond A, MD       heparin injection 5,000 Units  5,000 Units Subcutaneous Q8H Emelia Loron, MD   5,000 Units at 06/16/21 8250   hydrALAZINE (APRESOLINE) injection 10 mg  10 mg Intravenous Q6H PRN Emelia Loron, MD       morphine 2 MG/ML injection 2 mg  2 mg Intravenous Q2H PRN Jacinto Halim, PA-C   2 mg at 06/16/21 5397   multivitamin (RENA-VIT) tablet 1 tablet  1 tablet Oral QHS Braun, Ava, DO   1 tablet at 06/15/21 2128   oxyCODONE (Oxy IR/ROXICODONE) immediate release tablet 5-10 mg  5-10 mg Oral Q4H PRN Jacinto Halim, PA-C   10 mg at 06/16/21 0354   pantoprazole (PROTONIX) EC tablet 40 mg  40 mg Oral BID Kathlen Mody, MD   40 mg at 06/16/21 0834   pregabalin (LYRICA) capsule 75 mg  75 mg Oral QHS Lynn Ito, MD   75 mg at 06/15/21 2127      Review of Systems: 10 systems reviewed and negative except per interval history/subjective  Physical Exam: Vitals:   06/15/21 2044 06/16/21 0355  BP: (!) 157/87 (!) 133/95  Pulse: (!) 115 (!) 117  Resp: 18 18  Temp: 98.4 F (36.9 C) 98.3 F (36.8 C)  SpO2: 100% 100%   Total I/O In: -  Out: 1300 [Urine:1300]  Intake/Output Summary (Last 24 hours) at 06/16/2021 0851 Last data filed at 06/16/2021 6734 Gross per 24 hour  Intake 1353.83 ml  Output 4450 ml  Net -3096.17 ml    Constitutional: well-appearing, no acute distress CV: normal rate, no edema Respiratory: clear to auscultation, normal work of breathing Gastrointestinal: soft, non-tender, no  palpable masses or hernias Skin: no visible lesions or rashes Extremities: 1+ pitting edema LLE to mid-calf, 1+ pitting edema of right ankle Psych: alert, judgement/insight appropriate, appropriate mood and affect   Test Results I personally reviewed new and old clinical labs and radiology tests Lab Results  Component Value Date   NA 136 06/16/2021   K 3.5 06/16/2021   CL 102 06/16/2021   CO2 24 06/16/2021   BUN 57 (H) 06/16/2021   CREATININE 5.13 (H) 06/16/2021   CALCIUM 8.4 (L) 06/16/2021   ALBUMIN 2.2 (L) 06/16/2021   PHOS 4.6 06/16/2021    Attending addendum:   Seen and examined independently.  Agree with note and exam as documented above by Dr. Larita Fife and as noted  here.   Spoke with patient via spanish interpreter   General adult male in bed in no acute distress HEENT normocephalic atraumatic extraocular movements intact sclera anicteric Neck supple trachea midline Lungs clear to auscultation bilaterally normal work of breathing at rest  Heart S1S2 no rub Abdomen soft nontender nondistended Extremities 1+ edema LLE and no RLE edema Psych normal mood and affect Neuro - awake and conversant in spanish; oriented to person, place and situation  Access nontunneled RIJ dialysis catheter   AKI  - ATN in setting of arrest and rhabdo; improving - baseline Cr is unknown - uop is much improved - over 3 liters on 2/6 - hopeful for renal recovery  - Hold off on HD on 2/7 for now and assess needs daily - NS at 75 ml/hr x 12 hours - note nontunneled catheter in place - renal had initially held off on tunn catheter with fevers which have now improved   Anemia in setting of renal failure  - PRBC's as needed  - s/p IV iron x 2 doses - on ESA - increased aranesp to 150 mcg weekly every monday   Dispo - continue inpatient monitoring  Estanislado Emms, MD 12:17 PM 06/16/2021

## 2021-06-16 NOTE — Progress Notes (Signed)
Pharmacy Antibiotic Note  Bora Ursini is a 46 y.o. male admitted on 05/24/2021 and how with concern for myositis/psoas abscess. Pharmacy has been consulted for Daptomycin dosing.   The patient's renal function is improving with increasing UOP and trend down in SCr without the use of HD from 5.13 << 5.4. CK this morning was wnl and stable. Will continue with q48h dosing for now but will adjust to q24h as renal function continues to improve. Cefepime added over the weekend to cover for UTI but given noted improvement on this - will plan to continue for now.   Plan: - Continue Daptomycin 700 mg (~8 mg/kg) every 48h - Continue Cefepime 1g IV every 24 hours - Will continue to follow renal function, culture results, LOT, and antibiotic de-escalation plans    Height: 6' (182.9 cm) Weight: 83.2 kg (183 lb 6.8 oz) IBW/kg (Calculated) : 77.6  Temp (24hrs), Avg:98.4 F (36.9 C), Min:98.2 F (36.8 C), Max:98.8 F (37.1 C)  Recent Labs  Lab 06/10/21 0405 06/11/21 0145 06/12/21 0450 06/13/21 0401 06/13/21 0830 06/14/21 0048 06/14/21 1125 06/15/21 0337 06/15/21 0842 06/16/21 0344  WBC 17.3* 13.1*  --   --  12.0*  --  10.0  --  9.4  --   CREATININE 8.82* 6.01* 7.86* 8.34*  --  4.94*  --  5.40*  --  5.13*     Estimated Creatinine Clearance: 20 mL/min (A) (by C-G formula based on SCr of 5.13 mg/dL (H)).    No Known Allergies  Antimicrobials this admission: CTX 1/15 >> 1/23 Flagyl 1/21 >> 1/23 Zosyn 1/24 >> 1/27 Cefazolin 1/27 >> 1/31 Vancomycin 2/1 x 1 Daptomycin 2/3 >> Cefepime 2/4 >>  Dose adjustments this admission: N/a  Microbiology results: 1/15 COVID/flu >> neg 1/15 MRSA PCR >> neg 1/23 GI panel << neg 1/29 CDiff >> neg 1/29 GI panel >> neg 1/29 BCx >> NGf 2/1 BCx >> ngx2d 2/1 UCx >> 20k PSA  Thank you for allowing pharmacy to be a part of this patients care.  Alycia Rossetti, PharmD, BCPS Infectious Diseases Clinical Pharmacist 06/16/2021 7:49 AM    **Pharmacist phone directory can now be found on [email or web address] ([email or web address]).  Listed under [place].

## 2021-06-17 LAB — RENAL FUNCTION PANEL
Albumin: 2 g/dL — ABNORMAL LOW (ref 3.5–5.0)
Anion gap: 11 (ref 5–15)
BUN: 51 mg/dL — ABNORMAL HIGH (ref 6–20)
CO2: 23 mmol/L (ref 22–32)
Calcium: 8.6 mg/dL — ABNORMAL LOW (ref 8.9–10.3)
Chloride: 104 mmol/L (ref 98–111)
Creatinine, Ser: 4.33 mg/dL — ABNORMAL HIGH (ref 0.61–1.24)
GFR, Estimated: 16 mL/min — ABNORMAL LOW (ref >60)
Glucose, Bld: 98 mg/dL (ref 70–99)
Phosphorus: 3.9 mg/dL (ref 2.5–4.6)
Potassium: 3.4 mmol/L — ABNORMAL LOW (ref 3.5–5.1)
Sodium: 138 mmol/L (ref 135–145)

## 2021-06-17 MED ORDER — SODIUM CHLORIDE 0.9 % IV SOLN: INTRAVENOUS | Status: AC

## 2021-06-17 MED ORDER — HYDROMORPHONE HCL 1 MG/ML IJ SOLN
0.5000 mg | INTRAMUSCULAR | Status: AC | PRN
  Administered 2021-06-17 – 2021-06-18 (×3): 0.5 mg via INTRAVENOUS
  Filled 2021-06-17 (×3): qty 1

## 2021-06-17 NOTE — Progress Notes (Signed)
Physical Therapy Treatment Patient Details Name: Andres Braun MRN: [id number] DOB: Oct 05, 1975 Today's Date: 06/17/2021   History of Present Illness 46 y.o. male admitted 05/24/21 after cardiac arrest at home; pt with agonal breathing, lost pulse, required 20-min CPR and 5 rounds epi. Workup for acute hypoxic respiratory failure, septic shock due to undertermined organism, AKI, acute metabolic encephalopathy, aspiration PNA of bilateral lower lobes due to vomit. ETT 1/15-1/19. CRRT 1/15-1/20. Brain MRI 1/22 negative. Cervical MRI 1/22 with small R-side disc protrusion C5-6. Abdominal CT showed muscular swelling, particularly iliopsoas and gluteal muscles consistent with rhabdomyolysis; hemorrhage noted within R iliopsoas. S/p diagnostic laparoscopy 1/24. MRI L femur 1/25 showed diffuse muscular abnormalities bilateral thighs with heterogenous T2 signal, especially within L glut max and gradratus femoris; findings compatible with dx of rhabdomyolysis, no drainable abscess identified; these findings could contribute to compartment syndrome. Unknown PMH.    PT Comments    Patient complaining of L LE burning from hip to foot which is constant. Patient fearful of weightbearing through L LE due to weakness. Patient does have trace activation of quad and glute in standing. Patient reporting that he has never had formal education. D/c recommendation remains appropriate.   Recommendations for follow up therapy are one component of a multi-disciplinary discharge planning process, led by the attending physician.  Recommendations may be updated based on patient status, additional functional criteria and insurance authorization.  Follow Up Recommendations  Skilled nursing-short term rehab (<3 hours/day)     Assistance Recommended at Discharge Frequent or constant Supervision/Assistance  Patient can return home with the following Help with stairs or ramp for entrance;A lot of help with  bathing/dressing/bathroom;Assistance with cooking/housework;Assist for transportation;Direct supervision/assist for financial management;Direct supervision/assist for medications management;A lot of help with walking and/or transfers   Equipment Recommendations  Wheelchair (measurements PT);Wheelchair cushion (measurements PT);Other (comment);Hospital bed;BSC/3in1    Recommendations for Other Services       Precautions / Restrictions Precautions Precautions: Fall Restrictions Weight Bearing Restrictions: No     Mobility  Bed Mobility Overal bed mobility: Needs Assistance Bed Mobility: Supine to Sit     Supine to sit: Min guard     General bed mobility comments: pt able to progress with bed rail used to static sitting    Transfers Overall transfer level: Needs assistance Equipment used: Ambulation equipment used Transfers: Sit to/from Stand Sit to Stand: +2 physical assistance, Min guard, +2 safety/equipment           General transfer comment: pt pulling up on stedy with shifting weight anteriorly onto the stedy. Able to weight shift L/R with trace muscle activiation on L LE Transfer via Lift Equipment: Stedy  Ambulation/Gait                   Stairs             Wheelchair Mobility    Modified Rankin (Stroke Patients Only)       Balance Overall balance assessment: Needs assistance Sitting-balance support: Feet supported, Bilateral upper extremity supported Sitting balance-Leahy Scale: Fair     Standing balance support: Bilateral upper extremity supported, Reliant on assistive device for balance, During functional activity Standing balance-Leahy Scale: Poor                              Cognition Arousal/Alertness: Awake/alert Behavior During Therapy: Flat affect, Impulsive Overall Cognitive Status: Difficult to assess Area of Impairment: Awareness, Problem solving, Memory, Following  commands, Safety/judgement                      Memory: Decreased recall of precautions Following Commands: Follows multi-step commands with increased time Safety/Judgement: Decreased awareness of safety, Decreased awareness of deficits Awareness: Emergent Problem Solving: Slow processing General Comments: pt reports no school and that he has ability to idenify some information but not read. pt reports mother assisting with teaching what pt does know        Exercises Other Exercises Other Exercises: pt with activation of R ankle now. decreased edema noted. pt able to adduct knees together x15 reps with cues to complete 15 stoppped at 10. After further understanding of patient background no formal education. Other Exercises: pt with trace muscle activation with weight shift at quad and glute    General Comments General comments (skin integrity, edema, etc.): pt reports LLE burning. noted change in medication 2 days ago      Pertinent Vitals/Pain Pain Assessment Pain Assessment: PAINAD Breathing: normal Negative Vocalization: none Facial Expression: sad, frightened, frown Body Language: rigid, fists clenched, knees up, pushing/pulling away, strikes out Consolability: distracted or reassured by voice/touch PAINAD Score: 4 Pain Intervention(s): Repositioned    Home Living                          Prior Function            PT Goals (current goals can now be found in the care plan section) Acute Rehab PT Goals PT Goal Formulation: With patient Time For Goal Achievement: 07/01/21 Potential to Achieve Goals: Fair Progress towards PT goals: Progressing toward goals    Frequency    Min 3X/week      PT Plan Current plan remains appropriate    Co-evaluation PT/OT/SLP Co-Evaluation/Treatment: Yes Reason for Co-Treatment: For patient/therapist safety;Necessary to address cognition/behavior during functional activity;To address functional/ADL transfers PT goals addressed during session:  Mobility/safety with mobility;Balance;Strengthening/ROM OT goals addressed during session: Proper use of Adaptive equipment and DME;Strengthening/ROM      AM-PAC PT "6 Clicks" Mobility   Outcome Measure  Help needed turning from your back to your side while in a flat bed without using bedrails?: A Little Help needed moving from lying on your back to sitting on the side of a flat bed without using bedrails?: A Little Help needed moving to and from a bed to a chair (including a wheelchair)?: Total Help needed standing up from a chair using your arms (e.g., wheelchair or bedside chair)?: Total Help needed to walk in hospital room?: Total Help needed climbing 3-5 steps with a railing? : Total 6 Click Score: 10    End of Session Equipment Utilized During Treatment: Gait belt Activity Tolerance: Patient limited by pain Patient left: in chair;with chair alarm set;with call bell/phone within reach Nurse Communication: Mobility status PT Visit Diagnosis: Muscle weakness (generalized) (M62.81);Difficulty in walking, not elsewhere classified (R26.2);Unsteadiness on feet (R26.81)     Time: 3893-7342 PT Time Calculation (min) (ACUTE ONLY): 43 min  Charges:  $Therapeutic Exercise: 8-22 mins                     Nabilah Davoli A. Dan Humphreys PT, DPT Acute Rehabilitation Services Pager [phone] Office [phone]    Viviann Spare 06/17/2021, 4:35 PM

## 2021-06-17 NOTE — Progress Notes (Signed)
Occupational Therapy Treatment Patient Details Name: Andres Braun MRN: [id number] DOB: 01-Jul-1975 Today's Date: 06/17/2021   History of present illness 46 y.o. male admitted 05/24/21 after cardiac arrest at home; pt with agonal breathing, lost pulse, required 20-min CPR and 5 rounds epi. Workup for acute hypoxic respiratory failure, septic shock due to undertermined organism, AKI, acute metabolic encephalopathy, aspiration PNA of bilateral lower lobes due to vomit. ETT 1/15-1/19. CRRT 1/15-1/20. Brain MRI 1/22 negative. Cervical MRI 1/22 with small R-side disc protrusion C5-6. Abdominal CT showed muscular swelling, particularly iliopsoas and gluteal muscles consistent with rhabdomyolysis; hemorrhage noted within R iliopsoas. S/p diagnostic laparoscopy 1/24. MRI L femur 1/25 showed diffuse muscular abnormalities bilateral thighs with heterogenous T2 signal, especially within L glut max and gradratus femoris; findings compatible with dx of rhabdomyolysis, no drainable abscess identified; these findings could contribute to compartment syndrome. Unknown PMH.   OT comments  Pt expressing that he has never had a formal education and that mother provided any education that he has for recognizing information. Pt was able to use phone to state day of week and day of the month. Pt very clearly states "but this does not mean I can read". Pt will need education at a level that patient can return demonstrate. Pt with LLE burning hip to foot and noted with medication changes within 2 days. Pt no longer tolerating weight on LLE. Recommendation for next session prone AAROM and heating joints prior to transfer to help progress patient. Recommendation for CIr at this time.     Recommendations for follow up therapy are one component of a multi-disciplinary discharge planning process, led by the attending physician.  Recommendations may be updated based on patient status, additional functional criteria and insurance  authorization.    Follow Up Recommendations  Acute inpatient rehab (3hours/day)    Assistance Recommended at Discharge Frequent or constant Supervision/Assistance  Patient can return home with the following  Two people to help with walking and/or transfers;Two people to help with bathing/dressing/bathroom;Assistance with cooking/housework;Assist for transportation;Direct supervision/assist for medications management;Help with stairs or ramp for entrance   Equipment Recommendations  BSC/3in1;Wheelchair (measurements OT);Wheelchair cushion (measurements OT)    Recommendations for Other Services Rehab consult    Precautions / Restrictions Precautions Precautions: Fall       Mobility Bed Mobility Overal bed mobility: Needs Assistance Bed Mobility: Supine to Sit Rolling: Min guard   Supine to sit: Min guard     General bed mobility comments: pt able to progress with bed rail used to static standing    Transfers Overall transfer level: Needs assistance Equipment used: Ambulation equipment used   Sit to Stand: +2 physical assistance, Min guard, +2 safety/equipment           General transfer comment: pt pulling up on stedy with anterior weight anteriorly onto the stedy Transfer via Lift Equipment: Stedy   Balance           Standing balance support: Bilateral upper extremity supported, Reliant on assistive device for balance, During functional activity Standing balance-Leahy Scale: Poor                             ADL either performed or assessed with clinical judgement   ADL Overall ADL's : Needs assistance/impaired Eating/Feeding: Modified independent                   Lower Body Dressing: Total assistance  General ADL Comments: pt dependent for LB care    Extremity/Trunk Assessment              Vision       Perception     Praxis      Cognition Arousal/Alertness: Awake/alert Behavior During Therapy:  Flat affect, Impulsive Overall Cognitive Status: Difficult to assess Area of Impairment: Awareness, Problem solving                     Memory: Decreased recall of precautions Following Commands: Follows multi-step commands with increased time Safety/Judgement: Decreased awareness of safety, Decreased awareness of deficits Awareness: Emergent Problem Solving: Slow processing General Comments: pt reports no school and that he has ability to idenify some information but not read. pt reports mother assisting with teaching what pt does know        Exercises Other Exercises Other Exercises: pt with activation of R ankle now. decreased edema noted. pt able to adduct knees together x15 reps with cues to complete 15 stoppped at 10. After further understanding of patient background no formal education. Other Exercises: pt with trace muscle activation with weight shift at quad and glute    Shoulder Instructions       General Comments pt reports LLE burning. noted change in medication 2 days ago    Pertinent Vitals/ Pain       Pain Assessment Pain Assessment: PAINAD Breathing: normal Negative Vocalization: none Facial Expression: sad, frightened, frown Body Language: rigid, fists clenched, knees up, pushing/pulling away, strikes out Consolability: distracted or reassured by voice/touch PAINAD Score: 4 Pain Intervention(s): Repositioned (RESTLESS and due to BI unable to express)  Home Living                                          Prior Functioning/Environment              Frequency  Min 2X/week        Progress Toward Goals  OT Goals(current goals can now be found in the care plan section)  Progress towards OT goals: Progressing toward goals  Acute Rehab OT Goals Patient Stated Goal: to leave hosptial OT Goal Formulation: With patient Time For Goal Achievement: 06/26/21 Potential to Achieve Goals: Good ADL Goals Pt Will Perform Grooming:  with set-up;with supervision;sitting Pt Will Perform Upper Body Bathing: with set-up;with supervision;sitting Pt Will Perform Lower Body Bathing: with min assist;with adaptive equipment;sitting/lateral leans Pt Will Transfer to Toilet: with min assist;squat pivot transfer;bedside commode Pt Will Perform Toileting - Clothing Manipulation and hygiene: with min assist;sitting/lateral leans Additional ADL Goal #1: Pt will be Min A in and OOB for basic ADLs Additional ADL Goal #2:  (met) Additional ADL Goal #3: met  Plan Discharge plan remains appropriate    Co-evaluation    PT/OT/SLP Co-Evaluation/Treatment: Yes Reason for Co-Treatment: For patient/therapist safety;To address functional/ADL transfers;Necessary to address cognition/behavior during functional activity   OT goals addressed during session: Proper use of Adaptive equipment and DME;Strengthening/ROM      AM-PAC OT "6 Clicks" Daily Activity     Outcome Measure   Help from another person eating meals?: None Help from another person taking care of personal grooming?: A Little Help from another person toileting, which includes using toliet, bedpan, or urinal?: A Lot Help from another person bathing (including washing, rinsing, drying)?: A Lot Help from another person to put on  and taking off regular upper body clothing?: A Little Help from another person to put on and taking off regular lower body clothing?: Total 6 Click Score: 15    End of Session Equipment Utilized During Treatment: Gait belt  OT Visit Diagnosis: Unsteadiness on feet (R26.81);Other abnormalities of gait and mobility (R26.89);Muscle weakness (generalized) (M62.81);Other symptoms and signs involving cognitive function;Pain Pain - Right/Left: Left Pain - part of body: Hip   Activity Tolerance Patient tolerated treatment well   Patient Left in chair;with call bell/phone within reach;with chair alarm set   Nurse Communication Patient requests pain meds         Time: 4196-2229 OT Time Calculation (min): 42 min  Charges: OT General Charges $OT Visit: 1 Visit OT Treatments $Therapeutic Activity: 23-37 mins   Brynn, OTR/L  Acute Rehabilitation Services Pager: [phone] Office: [phone] .   Jeri Modena 06/17/2021, 3:57 PM

## 2021-06-17 NOTE — Progress Notes (Signed)
PROGRESS NOTE    Andres EdelsonOscar Diaz Braun  UJW:[id number] DOB: 01/01/1976 DOA: 05/24/2021 PCP: [name], No   Brief Narrative:  46 year old male admitted to ICU 1/15 under PCCM care after cardiac arrest at home.  PEA arrest, s/p CPR x20 minutes and epi x5.  Patient reportedly out drinking with his friends the night prior to admission.  Found by friends with agonal breathing.  Intubated on arrival, requiring vasopressors, noted to have profound renal failure, rhabdomyolysis with CK in the 14 K range.  Patient initially treated for aspiration pneumonia, placed on CRRT with nephrology.  Patient ultimately extubated 05/28/2021 remains off pressors.  [facility]  Hospital stay complicated by questionable small bowel ischemia and lower extremity neuropathy .  Patient was emergently taking to the OR on 24 January with abnormally thickened bowel but viable tissue.  Imaging reveals profound muscular swelling at the iliopsoas and gluteal muscles with questionable right iliopsoas hemorrhage versus hematoma. IR unable to successfully aspirate the space or perform drain placement. Neurology consulted, suggested not a neurological injury as the neuraxis is negative.   Assessment & Plan:    AKI versus overt renal failure in the setting of profound metabolic acidosis and rhabdomyolysis Nephrology following, appreciate insight and recommendations  CRRT 1/15-1/19/2023. Reassuringly urine output continues to improve, labs normalizing Initially on intermittent hemodialysis    Psoas swelling in the setting of severe rhabdo, unlikely abscess (HCC) versus hematoma, POA IR unable to drain or aspirate this area Imaging consistent with swelling Cultures remain negative, leukocytosis Blood cultures negative to date Leukocytosis downtrending appropriately ID following - Continue with current antibiotics cefepime and daptomycin  Cardiac arrest with pulseless electrical activity (HCC) S/p PEA arrest, s/p  CPR x 20 minutes and Epi x 5. 2/7 now stable     Hyponatremia, hypovolemic, improving with dialysis Due to volume overload 2/7 improved with HD    Thrombocytosis Reactive given above   Bilateral lower extremity weakness, improving MRI of the neuraxis  was unremarkable for acute process .  As per the MRI of the lumbar spine, multiple fluid collections in the right ileo psoas muscle.  Sideline previously with with neurology and neuro surgery on 1/24, no surgical cause for his lower extremity weakness. He suggested that a psoas abscess or hematoma could cause lumbar plexus compression /irritation leading to secondary weakness.  Further recommended outpatient EMG/NCS. IR consulted for aspiration -unobtainable Orthopedics consulted, suggested its a neurological/ lumbar plexus ischemic injury.  Continues to improve and build strength and sensation over the past 24 hours, follow-up PT recommendations for placement  Acute posthemorrhagic anemia s/p 1 unit prbc -continue to follow clinically S/p iv iron 2/6 and today On Aranesp   Elevated liver enzymes Probably from ETOH abuse, shock liver and rhabdomyolysis.  Much improved   Protein-calorie malnutrition, severe Supplement was added   Aspiration pneumonia of both lower lobes due to vomit (HCC)- (present on admission) Completed course of abx   Acute metabolic encephalopathy- (present on admission) Resolved, at baseline   Septic shock due to undetermined organism [facility](HCC)- (present on admission) Vital stable   Acute respiratory failure with hypoxia and hypercapnia (HCC)- (present on admission) Patient intubated on 05/24/2021 and extubated on 05/28/2021 .  patient completed course of antibiotics for aspiration pneumonia  On RA   DVT prophylaxis: heparin Code Status:full Family Communication: none  Status is: Inpatient  Dispo: The patient is from: Home              Anticipated d/c is to:  To be determined              Anticipated d/c  date is: 40 to 72 hours              Patient currently not medically stable for discharge  Consultants:  Nephrology, PCCM, neurology, neurosurgery, orthopedic surgery  Procedures:  Exploratory laparoscopy as above  Antimicrobials:  Cefepime, daptomycin ongoing per ID  Subjective: No acute issues or events overnight, patient's main concern at this point is being discharged home which we discussed he is not stable or strong enough to discharge home at this time.  Review of systems otherwise unremarkable other than ongoing leg paresthesias that appear to be improving over the past 48 hours  Objective: Vitals:   06/16/21 1220 06/16/21 1657 06/16/21 2126 06/17/21 0516  BP: 140/89 (!) 143/93 (!) 142/95 (!) 147/78  Pulse: (!) 113 (!) 109 (!) 112 (!) 111  Resp: 18 18 18 18   Temp: 98.4 F (36.9 C) 98.7 F (37.1 C) 99.8 F (37.7 C) 98.7 F (37.1 C)  TempSrc: Oral Oral Oral Oral  SpO2: 100% 100% 100% 100%  Weight:      Height:        Intake/Output Summary (Last 24 hours) at 06/17/2021 08/15/2021 Last data filed at 06/17/2021 0516 Gross per 24 hour  Intake 1782.5 ml  Output 5700 ml  Net -3917.5 ml   Filed Weights   06/10/21 1124 06/13/21 0506 06/14/21 0536  Weight: 85.5 kg 86.8 kg 83.2 kg    Examination:  General exam: Appears calm and comfortable  Respiratory system: Clear to auscultation. Respiratory effort normal. Cardiovascular system: S1 & S2 heard, RRR. No JVD, murmurs, rubs, gallops or clicks. No pedal edema. Gastrointestinal system: Abdomen is nondistended, soft and nontender. No organomegaly or masses felt. Normal bowel sounds heard. Central nervous system: Alert and oriented. No focal neurological deficits. Extremities: Symmetric 5 x 5 power. Skin: No rashes, lesions or ulcers Psychiatry: Judgement and insight appear normal. Mood & affect appropriate.     Data Reviewed: I have personally reviewed following labs and imaging studies  CBC: Recent Labs  Lab 06/11/21 0145  06/13/21 0830 06/14/21 1125 06/15/21 0842  WBC 13.1* 12.0* 10.0 9.4  HGB 7.4* 7.2* 6.7* 8.2*  HCT 23.5* 22.7* 22.2* 26.7*  MCV 88.3 86.3 88.4 85.3  PLT 693* 696* 637* 650*   Basic Metabolic Panel: Recent Labs  Lab 06/13/21 0401 06/14/21 0048 06/15/21 0337 06/16/21 0344 06/17/21 0415  NA 131* 136 132* 136 138  K 3.9 3.5 3.6 3.5 3.4*  CL 95* 99 97* 102 104  CO2 22 25 24 24 23   GLUCOSE 99 134* 95 100* 98  BUN 70* 40* 50* 57* 51*  CREATININE 8.34* 4.94* 5.40* 5.13* 4.33*  CALCIUM 8.1* 7.9* 8.2* 8.4* 8.6*  PHOS 7.3* 4.1 5.0* 4.6 3.9   GFR: Estimated Creatinine Clearance: 23.6 mL/min (A) (by C-G formula based on SCr of 4.33 mg/dL (H)). Liver Function Tests: Recent Labs  Lab 06/13/21 0401 06/14/21 0048 06/15/21 0337 06/16/21 0344 06/17/21 0415  ALBUMIN 1.5* <1.5* 1.6* 2.2* 2.0*   No results for input(s): LIPASE, AMYLASE in the last 168 hours. No results for input(s): AMMONIA in the last 168 hours. Coagulation Profile: No results for input(s): INR, PROTIME in the last 168 hours. Cardiac Enzymes: Recent Labs  Lab 06/11/21 0145 06/16/21 0344  CKTOTAL 131 88   BNP (last 3 results) No results for input(s): PROBNP in the last 8760 hours. HbA1C: No results for input(s): HGBA1C  in the last 72 hours. CBG: No results for input(s): GLUCAP in the last 168 hours. Lipid Profile: No results for input(s): CHOL, HDL, LDLCALC, TRIG, CHOLHDL, LDLDIRECT in the last 72 hours. Thyroid Function Tests: No results for input(s): TSH, T4TOTAL, FREET4, T3FREE, THYROIDAB in the last 72 hours. Anemia Panel: No results for input(s): VITAMINB12, FOLATE, FERRITIN, TIBC, IRON, RETICCTPCT in the last 72 hours. Sepsis Labs: No results for input(s): PROCALCITON, LATICACIDVEN in the last 168 hours.  Recent Results (from the past 240 hour(s))  Culture, blood (routine x 2)     Status: None   Collection Time: 06/07/21 10:14 AM   Specimen: BLOOD  Result Value Ref Range Status   Specimen  Description BLOOD RIGHT ANTECUBITAL  Final   Special Requests   Final    BOTTLES DRAWN AEROBIC ONLY Blood Culture adequate volume   Culture   Final    NO GROWTH 5 DAYS Performed at [facility], [address], [place], [place] [address]    Report Status 06/12/2021 FINAL  Final  Culture, blood (routine x 2)     Status: None   Collection Time: 06/07/21 10:17 AM   Specimen: BLOOD RIGHT HAND  Result Value Ref Range Status   Specimen Description BLOOD RIGHT HAND  Final   Special Requests   Final    BOTTLES DRAWN AEROBIC ONLY Blood Culture adequate volume   Culture   Final    NO GROWTH 5 DAYS Performed at [facility], [address] [place], [place] [address]    Report Status 06/12/2021 FINAL  Final  Gastrointestinal Panel by PCR , Stool     Status: None   Collection Time: 06/07/21  8:38 PM   Specimen: STOOL  Result Value Ref Range Status   Campylobacter species NOT DETECTED NOT DETECTED Final   Plesimonas shigelloides NOT DETECTED NOT DETECTED Final   Salmonella species NOT DETECTED NOT DETECTED Final   Yersinia enterocolitica NOT DETECTED NOT DETECTED Final   Vibrio species NOT DETECTED NOT DETECTED Final   Vibrio cholerae NOT DETECTED NOT DETECTED Final   Enteroaggregative E coli (EAEC) NOT DETECTED NOT DETECTED Final   Enteropathogenic E coli (EPEC) NOT DETECTED NOT DETECTED Final   Enterotoxigenic E coli (ETEC) NOT DETECTED NOT DETECTED Final   Shiga like toxin producing E coli (STEC) NOT DETECTED NOT DETECTED Final   Shigella/Enteroinvasive E coli (EIEC) NOT DETECTED NOT DETECTED Final   Cryptosporidium NOT DETECTED NOT DETECTED Final   Cyclospora cayetanensis NOT DETECTED NOT DETECTED Final   Entamoeba histolytica NOT DETECTED NOT DETECTED Final   Giardia lamblia NOT DETECTED NOT DETECTED Final   Adenovirus F40/41 NOT DETECTED NOT DETECTED Final   Astrovirus NOT DETECTED NOT DETECTED Final   Norovirus GI/GII NOT DETECTED NOT DETECTED Final   Rotavirus A NOT  DETECTED NOT DETECTED Final   Sapovirus (I, II, IV, and V) NOT DETECTED NOT DETECTED Final    Comment: Performed at [facility], [address] [place], [place] [address]  C Difficile Quick Screen (NO PCR Reflex)     Status: None   Collection Time: 06/07/21  8:38 PM   Specimen: STOOL  Result Value Ref Range Status   C Diff antigen NEGATIVE NEGATIVE Final   C Diff toxin NEGATIVE NEGATIVE Final   C Diff interpretation No C. difficile detected.  Final    Comment: Performed at [facility], [address], [place], [place] [address]  Urine Culture     Status:  Abnormal   Collection Time: 06/10/21  6:37 PM   Specimen: Urine, Clean Catch  Result Value Ref Range Status   Specimen Description URINE, CLEAN CATCH  Final   Special Requests   Final    NONE Performed at [facility], [address], [place], [place] [address]    Culture 20,000 COLONIES/mL PSEUDOMONAS AERUGINOSA (A)  Final   Report Status 06/13/2021 FINAL  Final   Organism ID, Bacteria PSEUDOMONAS AERUGINOSA (A)  Final      Susceptibility   Pseudomonas aeruginosa - MIC*    CEFTAZIDIME 4 SENSITIVE Sensitive     CIPROFLOXACIN <=0.25 SENSITIVE Sensitive     GENTAMICIN <=1 SENSITIVE Sensitive     IMIPENEM 1 SENSITIVE Sensitive     PIP/TAZO 8 SENSITIVE Sensitive     CEFEPIME 2 SENSITIVE Sensitive     * 20,000 COLONIES/mL PSEUDOMONAS AERUGINOSA  Culture, blood (routine x 2)     Status: None   Collection Time: 06/10/21  9:02 PM   Specimen: BLOOD  Result Value Ref Range Status   Specimen Description BLOOD LEFT ANTECUBITAL  Final   Special Requests   Final    BOTTLES DRAWN AEROBIC AND ANAEROBIC Blood Culture adequate volume   Culture   Final    NO GROWTH 5 DAYS Performed at [facility], [address] [place], [place] [address]    Report Status 06/15/2021 FINAL  Final  Culture, blood (routine x 2)     Status: None   Collection Time: 06/10/21  9:02 PM   Specimen: BLOOD  Result Value Ref Range  Status   Specimen Description BLOOD RIGHT ANTECUBITAL  Final   Special Requests   Final    BOTTLES DRAWN AEROBIC AND ANAEROBIC Blood Culture results may not be optimal due to an inadequate volume of blood received in culture bottles   Culture   Final    NO GROWTH 5 DAYS Performed at [facility], [address] [place], [place] [address]    Report Status 06/15/2021 FINAL  Final  Resp Panel by RT-PCR (Flu A&B, Covid) Nasopharyngeal Swab     Status: None   Collection Time: 06/14/21 10:16 AM   Specimen: Nasopharyngeal Swab; Nasopharyngeal(NP) swabs in vial transport medium  Result Value Ref Range Status   SARS Coronavirus 2 by RT PCR NEGATIVE NEGATIVE Final    Comment: (NOTE) SARS-CoV-2 target nucleic acids are NOT DETECTED.  The SARS-CoV-2 RNA is generally detectable in upper respiratory specimens during the acute phase of infection. The lowest concentration of SARS-CoV-2 viral copies this assay can detect is 138 copies/mL. A negative result does not preclude SARS-Cov-2 infection and should not be used as the sole basis for treatment or other patient management decisions. A negative result may occur with  improper specimen collection/handling, submission of specimen other than nasopharyngeal swab, presence of viral mutation(s) within the areas targeted by this assay, and inadequate number of viral copies(<138 copies/mL). A negative result must be combined with clinical observations, patient history, and epidemiological information. The expected result is Negative.  Fact Sheet for Patients:  BloggerCourse.com  Fact Sheet for Healthcare Providers:  SeriousBroker.it  This test is no t yet approved or cleared by the [place] FDA and  has been authorized for detection and/or diagnosis of SARS-CoV-2 by FDA under an Emergency Use Authorization (EUA). This EUA will remain  in effect (meaning this test can be used) for the  duration of the COVID-19 declaration under Section 564(b)(1) of the Act,  21 U.S.C.section 360bbb-3(b)(1), unless the authorization is terminated  or revoked sooner.       Influenza A by PCR NEGATIVE NEGATIVE Final   Influenza B by PCR NEGATIVE NEGATIVE Final    Comment: (NOTE) The Xpert Xpress SARS-CoV-2/FLU/RSV plus assay is intended as an aid in the diagnosis of influenza from Nasopharyngeal swab specimens and should not be used as a sole basis for treatment. Nasal washings and aspirates are unacceptable for Xpert Xpress SARS-CoV-2/FLU/RSV testing.  Fact Sheet for Patients: BloggerCourse.comhttps://www.fda.gov/media/152166/download  Fact Sheet for Healthcare Providers: SeriousBroker.ithttps://www.fda.gov/media/152162/download  This test is not yet approved or cleared by the [place] FDA and has been authorized for detection and/or diagnosis of SARS-CoV-2 by FDA under an Emergency Use Authorization (EUA). This EUA will remain in effect (meaning this test can be used) for the duration of the COVID-19 declaration under Section 564(b)(1) of the Act, 21 U.S.C. section 360bbb-3(b)(1), unless the authorization is terminated or revoked.  Performed at [facility], [address] [place], [place] [address]          Radiology Studies: No results found.      Scheduled Meds:  (feeding supplement) PROSource Plus  30 mL Oral BID BM   calcium acetate  1,334 mg Oral TID WC   Chlorhexidine Gluconate Cloth  6 each Topical Q0600   darbepoetin (ARANESP) injection - NON-DIALYSIS  150 mcg Subcutaneous Q Mon-1800   feeding supplement (NEPRO CARB STEADY)  237 mL Oral QHS   folic acid  1 mg Per Tube Daily   Gerhardt's butt cream   Topical TID   heparin injection (subcutaneous)  5,000 Units Subcutaneous Q8H   multivitamin  1 tablet Oral QHS   pantoprazole  40 mg Oral BID   pregabalin  75 mg Oral QHS   Continuous Infusions:  sodium chloride Stopped (05/29/21 1602)   ceFEPime (MAXIPIME) IV Stopped  (06/16/21 1718)   DAPTOmycin (CUBICIN)  IV 700 mg (06/16/21 2002)     LOS: 24 days   Time spent: 40min  Azucena FallenWilliam C Chana Lindstrom, DO [facility]  If 7PM-7AM, please contact night-coverage [email or web address]  06/17/2021, 7:12 AM

## 2021-06-17 NOTE — Progress Notes (Addendum)
Nephrology Follow-Up Consult note   Assessment/Recommendations: Andres Braun is a/an 46 y.o. male with no known past medical history admitted to ICU 1/15 s/p cardiac arrest. Nephrology consulted 1/15 for acute renal failure and CRRT due to refractory metabolic acidosis, required CRRT 1/15-1/19, now on intermittent HD, last Saturday 2/04. Transferred to hospitalist service 1/24.    Acute renal failure vs severe AKI: In setting of cardiac arrest and rhabdomyolysis. Seems to be improving s/p last HD 2/04. Received albumin 2/6, 12 hours of maintenance IVF 2/6, 2/7. Previously waiting on [facility] due to intermittent fevers; last fever 2/04, but given improvement may not need HD with TDC. No HD today given improving creatinine, excellent UOP, and normal mentation. We will continue to monitor progress and potential need for HD daily.  - Consider an alternative pain medication to morphine, as his kidney function is likely resulting in delayed clearance - UOP 5.7 L last 24 hours (1.4 L > 3.3 L > 3.1 L > 5.7 today) - Cr improved since last HD Sat 2/04: 5.40>5.13>4.33 today - Last fever 102.1*F 2/04 @ 0131 - Continue to monitor daily Cr, Dose meds for GFR - Monitor Daily I/Os, Daily weight  - Maintain MAP>65 for optimal renal perfusion.  - Avoid nephrotoxic medications including NSAIDs and Vanc/Zosyn combo   Anemia: S/p transfusion 1/31 and 2/05, IV iron 2/6 and 2/7. - Transfuse pRBCs PRN for Hgb<7 g/dL - Aranesp now 150 mcg weekly - Last Hgb 8.2 on 2/6   Hyperphosphatemia: Improved with binders.   S/p cardiac arrest: Asystole likely secondary to aspiration pneumonia/ARDS in context of EtOH ingestion  Psoas hematoma and fevers: ID following, last fever 2/04.  Potential abscess amenable to drainage.  Cefepime added by ID for Pseudomonas in urine culture.   Possible ischemic bowel: Ex lap this admission negative for ischemic bowel, only showed edema.   LE neuro deficits: Ortho, neurology, neurosurgery  seen.  Possibly due to psoas hematoma above.   Recommendations conveyed to primary service.    Ezequiel Essex, MD Family Medicine PGY-2 Nephrology Service [facility] 06/17/2021 10:29 AM  ___________________________________________________________  CC: None today  Interval History/Subjective: Patient awake in bed with two nurses at bedside. Patient reports feeling well, and that his swelling is improved. No questions. Discussed no HD today given great UOP and improved Cr.    Medications:  Current Facility-Administered Medications  Medication Dose Route Frequency Provider Last Rate Last Admin   (feeding supplement) PROSource Plus liquid 30 mL  30 mL Oral BID BM Karleen Hampshire, Vijaya, MD   30 mL at 06/17/21 1021   0.9 %  sodium chloride infusion  250 mL Intravenous Continuous Rolm Bookbinder, MD   Stopped at 05/29/21 1602   acetaminophen (TYLENOL) tablet 650 mg  650 mg Oral Q6H PRN Rise Patience, MD   650 mg at 06/14/21 2155   albuterol (PROVENTIL) (2.5 MG/3ML) 0.083% nebulizer solution 2.5 mg  2.5 mg Nebulization Q2H PRN Rolm Bookbinder, MD   2.5 mg at 05/24/21 1143   calcium acetate (PHOSLO) capsule 1,334 mg  1,334 mg Oral TID WC Corliss Parish, MD   1,334 mg at 06/17/21 0748   ceFEPIme (MAXIPIME) 1 g in sodium chloride 0.9 % 100 mL IVPB  1 g Intravenous Q24H Reesa Chew, MD 200 mL/hr at 06/17/21 0749 1 g at 06/17/21 0749   Chlorhexidine Gluconate Cloth 2 % PADS 6 each  6 each Topical Q0600 Corliss Parish, MD   6 each at 06/17/21 0753   DAPTOmycin (  CUBICIN) 700 mg in sodium chloride 0.9 % IVPB  8 mg/kg Intravenous Q48H Nolberto Hanlon, MD   Stopped at 06/16/21 2032   Darbepoetin Alfa (ARANESP) injection 150 mcg  150 mcg Subcutaneous Q Mon-1800 Claudia Desanctis, MD   150 mcg at 06/15/21 1726   feeding supplement (NEPRO CARB STEADY) liquid 237 mL  237 mL Oral QHS Hosie Poisson, MD   237 mL at [id number]   folic acid (FOLVITE) tablet 1 mg  1 mg Per Tube  Daily Rolm Bookbinder, MD   1 mg at 06/17/21 1021   Gerhardt's butt cream   Topical TID Swayze, Ava, DO   Given at 06/16/21 0836   guaiFENesin-dextromethorphan (ROBITUSSIN DM) 100-10 MG/5ML syrup 5 mL  5 mL Oral Q4H PRN Sherrilyn Rist A, MD       heparin injection 5,000 Units  5,000 Units Subcutaneous Q8H Rolm Bookbinder, MD   5,000 Units at 06/17/21 0556   hydrALAZINE (APRESOLINE) injection 10 mg  10 mg Intravenous Q6H PRN Rolm Bookbinder, MD       morphine 2 MG/ML injection 2 mg  2 mg Intravenous Q2H PRN Jillyn Ledger, PA-C   2 mg at 06/17/21 [id number]   multivitamin (RENA-VIT) tablet 1 tablet  1 tablet Oral QHS Swayze, Ava, DO   1 tablet at 06/16/21 2129   oxyCODONE (Oxy IR/ROXICODONE) immediate release tablet 5-10 mg  5-10 mg Oral Q4H PRN Jillyn Ledger, PA-C   10 mg at 06/17/21 0831   pantoprazole (PROTONIX) EC tablet 40 mg  40 mg Oral BID Hosie Poisson, MD   40 mg at 06/17/21 1021   pregabalin (LYRICA) capsule 75 mg  75 mg Oral QHS Nolberto Hanlon, MD   75 mg at 06/16/21 2129      Review of Systems: 10 systems reviewed and negative except per interval history/subjective  Physical Exam: Vitals:   06/17/21 0516 06/17/21 0800  BP: (!) 147/78 (!) 139/99  Pulse: (!) 111 (!) 111  Resp: 18 17  Temp: 98.7 F (37.1 C) 98.3 F (36.8 C)  SpO2: 100% 100%   Total I/O In: 120 [P.O.:120] Out: 800 [Urine:800]  Intake/Output Summary (Last 24 hours) at 06/17/2021 1029 Last data filed at 06/17/2021 [id number] Gross per 24 hour  Intake 1662.5 ml  Output 5200 ml  Net -3537.5 ml   Constitutional: well-appearing, no acute distress CV: normal rate, trace edema of BLE Respiratory: clear to auscultation, normal work of breathing Skin: no visible lesions or rashes Psych: alert, judgement/insight appropriate, appropriate mood and affect  Test Results I personally reviewed new and old clinical labs and radiology tests Lab Results  Component Value Date   NA 138 06/17/2021   K 3.4 (L)  06/17/2021   CL 104 06/17/2021   CO2 23 06/17/2021   BUN 51 (H) 06/17/2021   CREATININE 4.33 (H) 06/17/2021   CALCIUM 8.6 (L) 06/17/2021   ALBUMIN 2.0 (L) 06/17/2021   PHOS 3.9 06/17/2021    --------------- Attending addendum:  Seen and examined independently.  Agree with note and exam as documented above by Dr. Jeani Hawking and as noted here.   Spoke with patient via [place] interpreter   General adult male in bed in no acute distress HEENT normocephalic atraumatic extraocular movements intact sclera anicteric Neck supple trachea midline Lungs clear to auscultation bilaterally normal work of breathing at rest  Heart S1S2 no rub Abdomen soft nontender nondistended Extremities trace edema LLE and no RLE edema Psych normal mood and affect Neuro - awake and  conversant in spanish; oriented to person, place and situation  Access nontunneled RIJ dialysis catheter   AKI  - ATN in setting of arrest and rhabdo; improving - baseline Cr is unknown - uop is much improved - over 5 liters on 2/7 if charted accurately - hopeful for renal recovery  - Holding HD for now - he is improving off of HD - NS at 75 ml/hr x 24 hours - note nontunneled catheter in place - renal had initially held off on tunn catheter with fevers which have now improved.  As his AKI is resolving hold off on tunneled catheter and retain nontunneled catheter   Anemia in setting of renal failure  - PRBC's as needed  - s/p IV iron x 2 doses - on ESA - increased aranesp to 150 mcg weekly every Monday - cbc in am   Dispo - continue inpatient monitoring.  Hopefully he will continue to improve off of dialysis.    Claudia Desanctis, MD 12:33 PM 06/17/2021

## 2021-06-18 DIAGNOSIS — D72829 Elevated white blood cell count, unspecified: Secondary | ICD-10-CM

## 2021-06-18 LAB — CBC
HCT: 26.5 % — ABNORMAL LOW (ref 39.0–52.0)
Hemoglobin: 8.3 g/dL — ABNORMAL LOW (ref 13.0–17.0)
MCH: 27.3 pg (ref 26.0–34.0)
MCHC: 31.3 g/dL (ref 30.0–36.0)
MCV: 87.2 fL (ref 80.0–100.0)
Platelets: 797 10*3/uL — ABNORMAL HIGH (ref 150–400)
RBC: 3.04 MIL/uL — ABNORMAL LOW (ref 4.22–5.81)
RDW: 15.2 % (ref 11.5–15.5)
WBC: 15.8 10*3/uL — ABNORMAL HIGH (ref 4.0–10.5)
nRBC: 0.3 % — ABNORMAL HIGH (ref 0.0–0.2)

## 2021-06-18 LAB — RENAL FUNCTION PANEL
Albumin: 2.1 g/dL — ABNORMAL LOW (ref 3.5–5.0)
Anion gap: 10 (ref 5–15)
BUN: 47 mg/dL — ABNORMAL HIGH (ref 6–20)
CO2: 22 mmol/L (ref 22–32)
Calcium: 8.6 mg/dL — ABNORMAL LOW (ref 8.9–10.3)
Chloride: 103 mmol/L (ref 98–111)
Creatinine, Ser: 3.43 mg/dL — ABNORMAL HIGH (ref 0.61–1.24)
GFR, Estimated: 22 mL/min — ABNORMAL LOW (ref >60)
Glucose, Bld: 112 mg/dL — ABNORMAL HIGH (ref 70–99)
Phosphorus: 3.4 mg/dL (ref 2.5–4.6)
Potassium: 4.1 mmol/L (ref 3.5–5.1)
Sodium: 135 mmol/L (ref 135–145)

## 2021-06-18 LAB — CK: Total CK: 103 U/L (ref 49–397)

## 2021-06-18 MED ORDER — GABAPENTIN 100 MG PO CAPS
200.0000 mg | ORAL_CAPSULE | Freq: Three times a day (TID) | ORAL | Status: DC
  Administered 2021-06-18 – 2021-06-23 (×18): 200 mg via ORAL
  Filled 2021-06-18 (×18): qty 2

## 2021-06-18 MED ORDER — SODIUM CHLORIDE 0.9 % IV SOLN
8.0000 mg/kg | Freq: Every day | INTRAVENOUS | Status: DC
  Administered 2021-06-18 – 2021-06-23 (×6): 700 mg via INTRAVENOUS
  Filled 2021-06-18 (×9): qty 14

## 2021-06-18 MED ORDER — SODIUM CHLORIDE 0.9 % IV SOLN
1.0000 g | Freq: Two times a day (BID) | INTRAVENOUS | Status: DC
  Filled 2021-06-18 (×2): qty 1

## 2021-06-18 MED ORDER — SODIUM CHLORIDE 0.9 % IV SOLN
1.0000 g | Freq: Two times a day (BID) | INTRAVENOUS | Status: DC
  Administered 2021-06-18: 1 g via INTRAVENOUS
  Filled 2021-06-18 (×4): qty 1

## 2021-06-18 MED ORDER — AMLODIPINE BESYLATE 5 MG PO TABS
5.0000 mg | ORAL_TABLET | Freq: Every day | ORAL | Status: DC
  Administered 2021-06-18 – 2021-07-07 (×20): 5 mg via ORAL
  Filled 2021-06-18 (×20): qty 1

## 2021-06-18 MED ORDER — SODIUM CHLORIDE 0.9 % IV SOLN
2.0000 g | Freq: Once | INTRAVENOUS | Status: AC
  Administered 2021-06-18: 2 g via INTRAVENOUS
  Filled 2021-06-18: qty 2

## 2021-06-18 NOTE — Anesthesia Postprocedure Evaluation (Signed)
Anesthesia Post Note  Patient: Andres Braun  Procedure(s) Performed: LAPAROSCOPY DIAGNOSTIC (Abdomen)     Patient location during evaluation: PACU Anesthesia Type: General Level of consciousness: awake and sedated Pain management: pain level controlled Vital Signs Assessment: post-procedure vital signs reviewed and stable Respiratory status: spontaneous breathing Cardiovascular status: stable Postop Assessment: no apparent nausea or vomiting Anesthetic complications: no   No notable events documented.  Last Vitals:  Vitals:   06/17/21 1632 06/18/21 0456  BP: (!) 151/107 (!) 152/98  Pulse: (!) 108 (!) 119  Resp: 18 19  Temp: 36.8 C 36.8 C  SpO2: 100% 100%    Last Pain:  Vitals:   06/18/21 5885  TempSrc:   PainSc: Asleep                 Caren Macadam

## 2021-06-18 NOTE — Progress Notes (Signed)
[facility] Progress Note  Name: Andres Braun MRN: [id number] DOB: [birth year]   Subjective:  He had 6.6 liters UOP over 2/8 charted.  Last HD on 2/4 with no UF.  Spoke with pt via spanish interpreter.  All questions answers. He is glad about the nontunneled catheter coming out.    Review of systems:  Denies shortness of breath or chest pain  Denies n/v   Intake/Output Summary (Last 24 hours) at 06/18/2021 1015 Last data filed at 06/18/2021 1002 Gross per 24 hour  Intake 1306.94 ml  Output 5850 ml  Net -4543.06 ml    Vitals:  Vitals:   06/17/21 1632 06/18/21 0456 06/18/21 0500 06/18/21 0919  BP: (!) 151/107 (!) 152/98  (!) 141/98  Pulse: (!) 108 (!) 119  (!) 118  Resp: 18 19  18   Temp: 98.2 F (36.8 C) 98.2 F (36.8 C)  98.8 F (37.1 C)  TempSrc: Oral Oral  Oral  SpO2: 100% 100%  100%  Weight:   84 kg   Height:         Physical Exam:  General adult male in bed in no acute distress   HEENT normocephalic atraumatic extraocular movements intact sclera anicteric Neck supple trachea midline Lungs clear to auscultation bilaterally normal work of breathing at rest  Heart S1S2 no rub Abdomen soft nontender nondistended Extremities trace edema LLE and no RLE edema Psych normal mood and affect Neuro - awake and conversant in spanish; oriented to person, place and situation  Access nontunneled RIJ dialysis catheter  Medications reviewed   Labs:  BMP Latest Ref Rng & Units 06/18/2021 06/17/2021 06/16/2021  Glucose 70 - 99 mg/dL 08/14/2021) 98 465(K)  BUN 6 - 20 mg/dL 354(S) 56(C) 12(X)  Creatinine 0.61 - 1.24 mg/dL 51(Z) 0.01(V) 4.94(W)  Sodium 135 - 145 mmol/L 135 138 136  Potassium 3.5 - 5.1 mmol/L 4.1 3.4(L) 3.5  Chloride 98 - 111 mmol/L 103 104 102  CO2 22 - 32 mmol/L 22 23 24   Calcium 8.9 - 10.3 mg/dL 9.67(R) ) 9.1(M)     Assessment/Plan:   # AKI  - ATN in setting of arrest and rhabdo; improving. Now with post-ATN diuresis - baseline Cr is  unknown - hopeful for renal recovery  - Holding HD for now - he is improving off of HD - remove nontunneled HD catheter - I will place order for IV team to remove - NS at 75 ml/hr x 24 hours   # Anemia in setting of renal failure  - PRBC's as needed  - s/p IV iron x 2 doses - on ESA - increased aranesp to 150 mcg weekly every Monday  # Hypertension  - start amlodipine 5 mg daily    # Hyperphosphatemia  - resolved with resolving AKI  # s/p cardiac arrest  - asystole felt likely 22/ aspiration PNA/ARDS in context of EtOH ingestion per charting   # Psoas hematoma - ID has seen. Per charting potential abscess   - abx per primary/ID   # left lower extremity weakness - primary team has contacted neurology and NSGY previously  - per primary team    Dispo - continue inpatient monitoring.   6.6(Z, MD 06/18/2021 10:34 AM

## 2021-06-18 NOTE — Progress Notes (Signed)
Pharmacy Antibiotic Note  Andres Braun is a 46 y.o. male admitted on 05/24/2021 and how with concern for myositis/psoas abscess. Pharmacy has been consulted for Daptomycin dosing.   The patient's renal function is improving with increasing UOP and trend down in SCr without the use of HD to 3.43 << 4.33. CK on 2/9 was wnl and stable. Will adjust Daptomycin and Cefepime dosing with renal improvement.   Plan: - Adjust Daptomycin to 700 mg (~8 mg/kg) every 24h - Adjust Cefepime 2g IV x 1 dose then, then 1 IV every 12 hours for now - will likely require additional adjustment tomorrow.  - Will continue to follow renal function, culture results, LOT, and antibiotic de-escalation plans    Height: 6' (182.9 cm) Weight: 84 kg (185 lb 3 oz) IBW/kg (Calculated) : 77.6  Temp (24hrs), Avg:98.3 F (36.8 C), Min:98.2 F (36.8 C), Max:98.6 F (37 C)  Recent Labs  Lab 06/13/21 0830 06/14/21 0048 06/14/21 1125 06/15/21 0337 06/15/21 0842 06/16/21 0344 06/17/21 0415 06/18/21 0500  WBC 12.0*  --  10.0  --  9.4  --   --  15.8*  CREATININE  --  4.94*  --  5.40*  --  5.13* 4.33* 3.43*     Estimated Creatinine Clearance: 29.9 mL/min (A) (by C-G formula based on SCr of 3.43 mg/dL (H)).    No Known Allergies  Antimicrobials this admission: CTX 1/15 >> 1/23 Flagyl 1/21 >> 1/23 Zosyn 1/24 >> 1/27 Cefazolin 1/27 >> 1/31 Vancomycin 2/1 x 1 Daptomycin 2/3 >> Cefepime 2/4 >>  Dose adjustments this admission: N/a  Microbiology results: 1/15 COVID/flu >> neg 1/15 MRSA PCR >> neg 1/23 GI panel << neg 1/29 CDiff >> neg 1/29 GI panel >> neg 1/29 BCx >> NGf 2/1 BCx >> ngx2d 2/1 UCx >> 20k PSA  Thank you for allowing pharmacy to be a part of this patients care.  Alycia Rossetti, PharmD, BCPS Infectious Diseases Clinical Pharmacist 06/18/2021 8:33 AM   **Pharmacist phone directory can now be found on [email or web address] ([email or web address]).  Listed under [place].

## 2021-06-18 NOTE — Plan of Care (Signed)
  Problem: Clinical Measurements: Goal: Ability to maintain clinical measurements within normal limits will improve Outcome: Progressing   

## 2021-06-18 NOTE — Progress Notes (Signed)
PROGRESS NOTE    Andres Braun  ZOX:[id number] DOB: Aug 19, 1975 DOA: 05/24/2021 PCP: Oneita Hurt, No   Brief Narrative:  46 year old male admitted to ICU 1/15 under PCCM care after cardiac arrest at home.  PEA arrest, s/p CPR x20 minutes and epi x5.  Patient reportedly out drinking with his friends the night prior to admission.  Found by friends with agonal breathing.  Intubated on arrival, requiring vasopressors, noted to have profound renal failure, rhabdomyolysis with CK in the 14 K range.  Patient initially treated for aspiration pneumonia, placed on CRRT with nephrology.  Patient ultimately extubated 05/28/2021 remains off pressors.  Transferred to [facility] service 06/02/2021.  Hospital stay complicated by questionable small bowel ischemia and lower extremity neuropathy .  Patient was emergently taking to the OR on 24 January with abnormally thickened bowel but viable tissue.  Imaging reveals profound muscular swelling at the iliopsoas and gluteal muscles with questionable right iliopsoas hemorrhage versus hematoma. IR unable to successfully aspirate the space or perform drain placement. Neurology consulted, suggested not a neurological injury as the neuraxis is negative.   Assessment & Plan:   AKI versus overt renal failure in the setting of profound metabolic acidosis and rhabdomyolysis, improving Nephrology following, appreciate insight and recommendations  CRRT 1/15-1/19/2023. Reassuringly urine output continues to improve, labs normalizing Initially on intermittent hemodialysis    Psoas swelling in the setting of severe rhabdo, unlikely abscess (HCC) versus hematoma, POA IR unable to drain or aspirate this area Imaging consistent with swelling Cultures remain negative, leukocytosis Blood cultures negative to date Leukocytosis downtrending appropriately ID following - Continue with current antibiotics cefepime and daptomycin  Cardiac arrest with pulseless electrical activity (HCC) S/p PEA  arrest, s/p CPR x 20 minutes and Epi x 5. 2/7 now stable     Hyponatremia, hypovolemic, improving with dialysis Due to volume overload 2/7 improved with HD    Thrombocytosis Reactive given above   Bilateral lower extremity weakness/pain, improving MRI of the neuraxis  was unremarkable for acute process .  Likely neurological/ lumbar plexus ischemic injury due to previous swelling.  Transition off Dilaudid to gabapentin for neuropathic pain control Continues to improve and build strength and sensation over the past 24 hours, follow-up PT recommendations for placement  Acute posthemorrhagic anemia s/p 2 unit prbc (1/31,2/5) -continue to follow clinically S/p iv iron 2/6 On Aranesp   Elevated liver enzymes Probably from ETOH abuse, shock liver and rhabdomyolysis.  Much improved   Protein-calorie malnutrition, severe Supplement was added   Aspiration pneumonia of both lower lobes due to vomit (HCC)- (present on admission) Completed course of abx   Acute metabolic encephalopathy- (present on admission) Resolved, at baseline   Septic shock due to undetermined organism [facility])- (present on admission) Vital stable   Acute respiratory failure with hypoxia and hypercapnia (HCC)- (present on admission) Patient intubated on 05/24/2021 and extubated on 05/28/2021 .  patient completed course of antibiotics for aspiration pneumonia  On RA   DVT prophylaxis: heparin Code Status:full Family Communication: none  Status is: Inpatient  Dispo: The patient is from: Home              Anticipated d/c is to: To be determined              Anticipated d/c date is: 40 to 72 hours              Patient currently not medically stable for discharge  Consultants:  Nephrology, PCCM, neurology, neurosurgery, orthopedic surgery  Procedures:  Exploratory laparoscopy as above  Antimicrobials:  Cefepime, daptomycin ongoing per ID  Subjective: Leg pain well controlled overnight improved with  Dilaudid, transition to gabapentin today given neuropathic pain  Objective: Vitals:   06/17/21 1200 06/17/21 1632 06/18/21 0456 06/18/21 0500  BP: (!) 145/102 (!) 151/107 (!) 152/98   Pulse: (!) 107 (!) 108 (!) 119   Resp: 18 18 19    Temp: 98.6 F (37 C) 98.2 F (36.8 C) 98.2 F (36.8 C)   TempSrc: Oral Oral Oral   SpO2:  100% 100%   Weight:    84 kg  Height:        Intake/Output Summary (Last 24 hours) at 06/18/2021 0730 Last data filed at 06/18/2021 0600 Gross per 24 hour  Intake 1426.94 ml  Output 6650 ml  Net -5223.06 ml    Filed Weights   06/13/21 0506 06/14/21 0536 06/18/21 0500  Weight: 86.8 kg 83.2 kg 84 kg    Examination:  General:  Pleasantly resting in bed, No acute distress. HEENT:  Normocephalic atraumatic.  Sclerae nonicteric, noninjected.  Extraocular movements intact bilaterally. Neck:  Without mass or deformity.  Trachea is midline. Lungs:  Clear to auscultate bilaterally without rhonchi, wheeze, or rales. Heart:  Regular rate and rhythm.  Without murmurs, rubs, or gallops. Abdomen:  Soft, nontender, nondistended.  Without guarding or rebound. Extremities: Without cyanosis, clubbing, edema, or obvious deformity.  Diminished sensation distally over the lower extremities and plantar surfaces Vascular:  Dorsalis pedis and posterior tibial pulses palpable bilaterally. Skin:  Warm and dry, no erythema, no ulcerations.    Data Reviewed: I have personally reviewed following labs and imaging studies  CBC: Recent Labs  Lab 06/13/21 0830 06/14/21 1125 06/15/21 0842 06/18/21 0500  WBC 12.0* 10.0 9.4 15.8*  HGB 7.2* 6.7* 8.2* 8.3*  HCT 22.7* 22.2* 26.7* 26.5*  MCV 86.3 88.4 85.3 87.2  PLT 696* 637* 650* 797*    Basic Metabolic Panel: Recent Labs  Lab 06/14/21 0048 06/15/21 0337 06/16/21 0344 06/17/21 0415 06/18/21 0500  NA 136 132* 136 138 135  K 3.5 3.6 3.5 3.4* 4.1  CL 99 97* 102 104 103  CO2 25 24 24 23 22   GLUCOSE 134* 95 100* 98 112*   BUN 40* 50* 57* 51* 47*  CREATININE 4.94* 5.40* 5.13* 4.33* 3.43*  CALCIUM 7.9* 8.2* 8.4* 8.6* 8.6*  PHOS 4.1 5.0* 4.6 3.9 3.4    GFR: Estimated Creatinine Clearance: 29.9 mL/min (A) (by C-G formula based on SCr of 3.43 mg/dL (H)). Liver Function Tests: Recent Labs  Lab 06/14/21 0048 06/15/21 0337 06/16/21 0344 06/17/21 0415 06/18/21 0500  ALBUMIN <1.5* 1.6* 2.2* 2.0* 2.1*    No results for input(s): LIPASE, AMYLASE in the last 168 hours. No results for input(s): AMMONIA in the last 168 hours. Coagulation Profile: No results for input(s): INR, PROTIME in the last 168 hours. Cardiac Enzymes: Recent Labs  Lab 06/16/21 0344 06/18/21 0500  CKTOTAL 88 103    BNP (last 3 results) No results for input(s): PROBNP in the last 8760 hours. HbA1C: No results for input(s): HGBA1C in the last 72 hours. CBG: No results for input(s): GLUCAP in the last 168 hours. Lipid Profile: No results for input(s): CHOL, HDL, LDLCALC, TRIG, CHOLHDL, LDLDIRECT in the last 72 hours. Thyroid Function Tests: No results for input(s): TSH, T4TOTAL, FREET4, T3FREE, THYROIDAB in the last 72 hours. Anemia Panel: No results for input(s): VITAMINB12, FOLATE, FERRITIN, TIBC, IRON, RETICCTPCT in the last 72 hours. Sepsis Labs: No results  for input(s): PROCALCITON, LATICACIDVEN in the last 168 hours.  Recent Results (from the past 240 hour(s))  Urine Culture     Status: Abnormal   Collection Time: 06/10/21  6:37 PM   Specimen: Urine, Clean Catch  Result Value Ref Range Status   Specimen Description URINE, CLEAN CATCH  Final   Special Requests   Final    NONE Performed at [facility], [address], [place], [place] [address]    Culture 20,000 COLONIES/mL PSEUDOMONAS AERUGINOSA (A)  Final   Report Status 06/13/2021 FINAL  Final   Organism ID, Bacteria PSEUDOMONAS AERUGINOSA (A)  Final      Susceptibility   Pseudomonas aeruginosa - MIC*    CEFTAZIDIME 4 SENSITIVE Sensitive      CIPROFLOXACIN <=0.25 SENSITIVE Sensitive     GENTAMICIN <=1 SENSITIVE Sensitive     IMIPENEM 1 SENSITIVE Sensitive     PIP/TAZO 8 SENSITIVE Sensitive     CEFEPIME 2 SENSITIVE Sensitive     * 20,000 COLONIES/mL PSEUDOMONAS AERUGINOSA  Culture, blood (routine x 2)     Status: None   Collection Time: 06/10/21  9:02 PM   Specimen: BLOOD  Result Value Ref Range Status   Specimen Description BLOOD LEFT ANTECUBITAL  Final   Special Requests   Final    BOTTLES DRAWN AEROBIC AND ANAEROBIC Blood Culture adequate volume   Culture   Final    NO GROWTH 5 DAYS Performed at [facility], [address] [place], [place] [address]    Report Status 06/15/2021 FINAL  Final  Culture, blood (routine x 2)     Status: None   Collection Time: 06/10/21  9:02 PM   Specimen: BLOOD  Result Value Ref Range Status   Specimen Description BLOOD RIGHT ANTECUBITAL  Final   Special Requests   Final    BOTTLES DRAWN AEROBIC AND ANAEROBIC Blood Culture results may not be optimal due to an inadequate volume of blood received in culture bottles   Culture   Final    NO GROWTH 5 DAYS Performed at [facility], [address], [place], [place] [address]    Report Status 06/15/2021 FINAL  Final  Resp Panel by RT-PCR (Flu A&B, Covid) Nasopharyngeal Swab     Status: None   Collection Time: 06/14/21 10:16 AM   Specimen: Nasopharyngeal Swab; Nasopharyngeal(NP) swabs in vial transport medium  Result Value Ref Range Status   SARS Coronavirus 2 by RT PCR NEGATIVE NEGATIVE Final    Comment: (NOTE) SARS-CoV-2 target nucleic acids are NOT DETECTED.  The SARS-CoV-2 RNA is generally detectable in upper respiratory specimens during the acute phase of infection. The lowest concentration of SARS-CoV-2 viral copies this assay can detect is 138 copies/mL. A negative result does not preclude SARS-Cov-2 infection and should not be used as the sole basis for treatment or other patient management decisions. A negative  result may occur with  improper specimen collection/handling, submission of specimen other than nasopharyngeal swab, presence of viral mutation(s) within the areas targeted by this assay, and inadequate number of viral copies(<138 copies/mL). A negative result must be combined with clinical observations, patient history, and epidemiological information. The expected result is Negative.  Fact Sheet for Patients:  BloggerCourse.com  Fact Sheet for Healthcare Providers:  SeriousBroker.it  This test is no t yet approved or cleared by the [place] FDA and  has been authorized for detection and/or diagnosis of SARS-CoV-2 by FDA under an Emergency Use Authorization (EUA). This  EUA will remain  in effect (meaning this test can be used) for the duration of the COVID-19 declaration under Section 564(b)(1) of the Act, 21 U.S.C.section 360bbb-3(b)(1), unless the authorization is terminated  or revoked sooner.       Influenza A by PCR NEGATIVE NEGATIVE Final   Influenza B by PCR NEGATIVE NEGATIVE Final    Comment: (NOTE) The Xpert Xpress SARS-CoV-2/FLU/RSV plus assay is intended as an aid in the diagnosis of influenza from Nasopharyngeal swab specimens and should not be used as a sole basis for treatment. Nasal washings and aspirates are unacceptable for Xpert Xpress SARS-CoV-2/FLU/RSV testing.  Fact Sheet for Patients: BloggerCourse.comhttps://www.fda.gov/media/152166/download  Fact Sheet for Healthcare Providers: SeriousBroker.ithttps://www.fda.gov/media/152162/download  This test is not yet approved or cleared by the [place] FDA and has been authorized for detection and/or diagnosis of SARS-CoV-2 by FDA under an Emergency Use Authorization (EUA). This EUA will remain in effect (meaning this test can be used) for the duration of the COVID-19 declaration under Section 564(b)(1) of the Act, 21 U.S.C. section 360bbb-3(b)(1), unless the authorization is  terminated or revoked.  Performed at [facility], [address] [place], [place] [id number]           Radiology Studies: No results found.      Scheduled Meds:  (feeding supplement) PROSource Plus  30 mL Oral BID BM   calcium acetate  1,334 mg Oral TID WC   Chlorhexidine Gluconate Cloth  6 each Topical Q0600   darbepoetin (ARANESP) injection - NON-DIALYSIS  150 mcg Subcutaneous Q Mon-1800   feeding supplement (NEPRO CARB STEADY)  237 mL Oral QHS   folic acid  1 mg Per Tube Daily   Gerhardt's butt cream   Topical TID   heparin injection (subcutaneous)  5,000 Units Subcutaneous Q8H   multivitamin  1 tablet Oral QHS   pantoprazole  40 mg Oral BID   pregabalin  75 mg Oral QHS   Continuous Infusions:  sodium chloride Stopped (05/29/21 1602)   sodium chloride 75 mL/hr at 06/17/21 1411   ceFEPime (MAXIPIME) IV Stopped (06/17/21 0819)   DAPTOmycin (CUBICIN)  IV Stopped (06/16/21 2032)     LOS: 25 days   Time spent: 40min  Azucena FallenWilliam C Franchot Pollitt, DO [facility]  If 7PM-7AM, please contact night-coverage [email or web address]  06/18/2021, 7:30 AM

## 2021-06-18 NOTE — Progress Notes (Signed)
[facility]   Reason for visit: Follow up on psoas hematoma/abscess  Interval History: now with more movement of his left leg, less diffuse lower body pain.  Remains afebrile.  WBC up to 15.8.   Day 23 total antibiotics  Physical Exam: Constitutional:  Vitals:   06/18/21 0456 06/18/21 0919  BP: (!) 152/98 (!) 141/98  Pulse: (!) 119 (!) 118  Resp: 19 18  Temp: 98.2 F (36.8 C) 98.8 F (37.1 C)  SpO2: 100% 100%   patient appears in NAD Respiratory: Normal respiratory effort; CTA B Cardiovascular: RRR GI: soft, nt, nd MS: left leg with movement from hip, less pain with touch.  Does not yet move toes.   Review of Systems: Constitutional: negative for fevers and chills Gastrointestinal: negative for nausea and diarrhea  Lab Results  Component Value Date   WBC 15.8 (H) 06/18/2021   HGB 8.3 (L) 06/18/2021   HCT 26.5 (L) 06/18/2021   MCV 87.2 06/18/2021   PLT 797 (H) 06/18/2021    Lab Results  Component Value Date   CREATININE 3.43 (H) 06/18/2021   BUN 47 (H) 06/18/2021   NA 135 06/18/2021   K 4.1 06/18/2021   CL 103 06/18/2021   CO2 22 06/18/2021    Lab Results  Component Value Date   ALT 16 06/08/2021   AST 25 06/08/2021   ALKPHOS 148 (H) 06/08/2021     Microbiology: Recent Results (from the past 240 hour(s))  Urine Culture     Status: Abnormal   Collection Time: 06/10/21  6:37 PM   Specimen: Urine, Clean Catch  Result Value Ref Range Status   Specimen Description URINE, CLEAN CATCH  Final   Special Requests   Final    NONE Performed at [facility], [address], [place], [place] [address]    Culture 20,000 COLONIES/mL PSEUDOMONAS AERUGINOSA (A)  Final   Report Status 06/13/2021 FINAL  Final   Organism ID, Bacteria PSEUDOMONAS AERUGINOSA (A)  Final      Susceptibility   Pseudomonas aeruginosa - MIC*    CEFTAZIDIME 4 SENSITIVE Sensitive     CIPROFLOXACIN <=0.25 SENSITIVE Sensitive     GENTAMICIN <=1 SENSITIVE  Sensitive     IMIPENEM 1 SENSITIVE Sensitive     PIP/TAZO 8 SENSITIVE Sensitive     CEFEPIME 2 SENSITIVE Sensitive     * 20,000 COLONIES/mL PSEUDOMONAS AERUGINOSA  Culture, blood (routine x 2)     Status: None   Collection Time: 06/10/21  9:02 PM   Specimen: BLOOD  Result Value Ref Range Status   Specimen Description BLOOD LEFT ANTECUBITAL  Final   Special Requests   Final    BOTTLES DRAWN AEROBIC AND ANAEROBIC Blood Culture adequate volume   Culture   Final    NO GROWTH 5 DAYS Performed at [facility], [address] [place], [place] [address]    Report Status 06/15/2021 FINAL  Final  Culture, blood (routine x 2)     Status: None   Collection Time: 06/10/21  9:02 PM   Specimen: BLOOD  Result Value Ref Range Status   Specimen Description BLOOD RIGHT ANTECUBITAL  Final   Special Requests   Final    BOTTLES DRAWN AEROBIC AND ANAEROBIC Blood Culture results may not be optimal due to an inadequate volume of blood received in culture bottles   Culture   Final    NO GROWTH 5 DAYS Performed at [facility], [address] [place], [place] [address]    Report Status 06/15/2021 FINAL  Final  Resp Panel by RT-PCR (Flu A&B, Covid) Nasopharyngeal Swab     Status: None   Collection Time: 06/14/21 10:16 AM   Specimen: Nasopharyngeal Swab; Nasopharyngeal(NP) swabs in vial transport medium  Result Value Ref Range Status   SARS Coronavirus 2 by RT PCR NEGATIVE NEGATIVE Final    Comment: (NOTE) SARS-CoV-2 target nucleic acids are NOT DETECTED.  The SARS-CoV-2 RNA is generally detectable in upper respiratory specimens during the acute phase of infection. The lowest concentration of SARS-CoV-2 viral copies this assay can detect is 138 copies/mL. A negative result does not preclude SARS-Cov-2 infection and should not be used as the sole basis for treatment or other patient management decisions. A negative result may occur with  improper specimen collection/handling, submission of  specimen other than nasopharyngeal swab, presence of viral mutation(s) within the areas targeted by this assay, and inadequate number of viral copies(<138 copies/mL). A negative result must be combined with clinical observations, patient history, and epidemiological information. The expected result is Negative.  Fact Sheet for Patients:  BloggerCourse.com  Fact Sheet for Healthcare Providers:  SeriousBroker.it  This test is no t yet approved or cleared by the [place] FDA and  has been authorized for detection and/or diagnosis of SARS-CoV-2 by FDA under an Emergency Use Authorization (EUA). This EUA will remain  in effect (meaning this test can be used) for the duration of the COVID-19 declaration under Section 564(b)(1) of the Act, 21 U.S.C.section 360bbb-3(b)(1), unless the authorization is terminated  or revoked sooner.       Influenza A by PCR NEGATIVE NEGATIVE Final   Influenza B by PCR NEGATIVE NEGATIVE Final    Comment: (NOTE) The Xpert Xpress SARS-CoV-2/FLU/RSV plus assay is intended as an aid in the diagnosis of influenza from Nasopharyngeal swab specimens and should not be used as a sole basis for treatment. Nasal washings and aspirates are unacceptable for Xpert Xpress SARS-CoV-2/FLU/RSV testing.  Fact Sheet for Patients: BloggerCourse.com  Fact Sheet for Healthcare Providers: SeriousBroker.it  This test is not yet approved or cleared by the [place] FDA and has been authorized for detection and/or diagnosis of SARS-CoV-2 by FDA under an Emergency Use Authorization (EUA). This EUA will remain in effect (meaning this test can be used) for the duration of the COVID-19 declaration under Section 564(b)(1) of the Act, 21 U.S.C. section 360bbb-3(b)(1), unless the authorization is terminated or revoked.  Performed at [facility], [address]  [place], [place] [address]     Impression/Plan:  1. Psoas abscess/hematoma - some improvement of pain in his pelvis/extremities and likely improvement of his psoas fluid collection.  He is on antibiotics with concern of infected hematoma with abscess.   Will continue with daptomycin and ceftriaxone.   Plan for a prolonged course of 3 more weeks or so and can use doxycycline and amoxicillin/clavulanate at discharge.  2.  AKI - continued recovery of his renal function. Post ATN diuresis.  Continuing to hold on dialysis.  Removing non-tunneled catheter.    3.  Leukocytosis - increased today but no new concerns of new infection.  He has continued to remain afebrile.  Antibiotics adjusted today and may have been a bit underdosed with renal recovery.   Will continue to monitor.

## 2021-06-19 ENCOUNTER — Encounter (HOSPITAL_COMMUNITY): Payer: Self-pay | Admitting: Pulmonary Disease

## 2021-06-19 LAB — RENAL FUNCTION PANEL
Albumin: 2.2 g/dL — ABNORMAL LOW (ref 3.5–5.0)
Anion gap: 11 (ref 5–15)
BUN: 41 mg/dL — ABNORMAL HIGH (ref 6–20)
CO2: 21 mmol/L — ABNORMAL LOW (ref 22–32)
Calcium: 8.7 mg/dL — ABNORMAL LOW (ref 8.9–10.3)
Chloride: 104 mmol/L (ref 98–111)
Creatinine, Ser: 2.7 mg/dL — ABNORMAL HIGH (ref 0.61–1.24)
GFR, Estimated: 29 mL/min — ABNORMAL LOW (ref >60)
Glucose, Bld: 105 mg/dL — ABNORMAL HIGH (ref 70–99)
Phosphorus: 3.4 mg/dL (ref 2.5–4.6)
Potassium: 3.9 mmol/L (ref 3.5–5.1)
Sodium: 136 mmol/L (ref 135–145)

## 2021-06-19 MED ORDER — SODIUM CHLORIDE 0.9 % IV SOLN
2.0000 g | Freq: Two times a day (BID) | INTRAVENOUS | Status: DC
  Administered 2021-06-19 – 2021-06-24 (×11): 2 g via INTRAVENOUS
  Filled 2021-06-19 (×11): qty 2

## 2021-06-19 NOTE — Progress Notes (Addendum)
[facility] Progress Note  Name: Andres Braun MRN: [id number] DOB: 12-25-1975  Subjective:  Reports doing well. Feels weak, has poor sleep due to many interruptions. Feels like his swelling is gone. Discussed improved creatinine and likelihood that he will not need HD again this hospitalization.   Review of systems:  No nausea or vomiting.  No leg swelling.  Feels weak and fatigued.    Intake/Output Summary (Last 24 hours) at 06/19/2021 1049 Last data filed at 06/19/2021 0543 Gross per 24 hour  Intake 2668.25 ml  Output 4300 ml  Net -1631.75 ml    Vitals:  Vitals:   06/18/21 2034 06/19/21 0032 06/19/21 0434 06/19/21 1001  BP: (!) 129/93 (!) 140/93 (!) 144/86 129/90  Pulse: (!) 125 (!) 115 (!) 124 (!) 122  Resp: 18 17 19 17   Temp: 98.5 F (36.9 C) 98 F (36.7 C) 99.9 F (37.7 C) 99.6 F (37.6 C)  TempSrc: Oral Oral  Oral  SpO2: 100% 100% 100% 99%  Weight:      Height:         Physical Exam:  General: Awake, alert, oriented Cardiovascular: Regular rate and rhythm, S1 and S2 present, no murmurs auscultated Respiratory: Lung fields clear to auscultation bilaterally Extremities: No bilateral lower extremity edema, palpable pedal and pretibial pulses bilaterally   Medications reviewed   Labs:  BMP Latest Ref Rng & Units 06/19/2021 06/18/2021 06/17/2021  Glucose 70 - 99 mg/dL 08/15/2021) 027(X) 98  BUN 6 - 20 mg/dL 412(I) 78(M) 76(H)  Creatinine 0.61 - 1.24 mg/dL 20(N) 4.70(J) 6.28(Z)  Sodium 135 - 145 mmol/L 136 135 138  Potassium 3.5 - 5.1 mmol/L 3.9 4.1 3.4(L)  Chloride 98 - 111 mmol/L 104 103 104  CO2 22 - 32 mmol/L 21(L) 22 23  Calcium 8.9 - 10.3 mg/dL 6.62(H) 4.7(M) 5.4(Y)    Assessment/Plan:   # AKI  - ATN in setting of arrest and rhabdo; improving. Now with post-ATN diuresis - baseline Cr is unknown - hopeful for renal recovery  - nontunneled HD catheter removed 2/09 - continues to improve without HD (last 2/04) - Cr today 2.70, BUN 41, UOP  5.1 L last 24 hours - continue maintenance IVF with NS @ 75 mL/hr, end tomorrow morning - DC aranesp - Nephrology to sign off, please re-consult as needed   # Anemia in setting of renal failure  - PRBC's as needed  - s/p pRBC x 2 - s/p IV iron x 2 doses - on ESA - increased aranesp to 150 mcg weekly every Monday   # Hypertension  - started amlodipine 5 mg 2/09 - BP acceptable for renal perfusion, no documented hypotension - keep MAP >65 for renal perfusion    # s/p cardiac arrest  - asystole felt likely 22/ aspiration PNA/ARDS in context of EtOH ingestion per charting    # Psoas hematoma - ID has seen. Per charting potential abscess   - abx per primary/ID    # left lower extremity weakness - primary team has contacted neurology and NSGY previously  - per primary team     Dispo - continue inpatient monitoring.    4/09, MD Family Medicine PGY-2 Nephrology Service 06/19/2021 10:49 AM ---------------------------  Attending addendum:   Seen and examined independently.  Agree with note and exam as documented above by Dr. 08/17/2021 and as noted here.  Nontunneled catheter was removed. Glad to hear about his kidneys  General adult male in bed in no acute distress  HEENT normocephalic atraumatic extraocular movements intact sclera anicteric Neck supple trachea midline Lungs clear to auscultation bilaterally normal work of breathing at rest  Heart S1S2 no rub Abdomen soft nontender nondistended Extremities no edema lower extremities Psych normal mood and affect Neuro - awake and conversant in spanish; oriented to person, place and situation; left leg strength grossly decreased Access clean bandage over old RIJ dialysis catheter site    # AKI  - ATN in setting of arrest and rhabdo; improving. Now with post-ATN diuresis - baseline Cr is unknown - resolving with supportive care - nontunneled catheter is out  - NS at 75 ml/hr x 24 hours - to end tomorrow   # Anemia  in setting of renal failure  - PRBC's as needed  - s/p IV iron x 2 doses - s/p ESA  - hopeful for continued improvement with improving renal function    # Hypertension  - amlodipine 5 mg daily - this is a new medication   # Hyperphosphatemia  - resolved with resolving AKI   # s/p cardiac arrest  - asystole felt likely 22/ aspiration PNA/ARDS in context of EtOH ingestion per charting    # Psoas hematoma - ID has seen. Per charting potential abscess   - abx per primary/ID    # left lower extremity weakness - primary team has contacted neurology and NSGY previously  - per primary team     Dispo - per primary team.  Nephrology will sign off.  AKI resolving.  Please do not hesitate to contact me with any questions.  I will set up nephrology follow-up in 2-3 weeks  Estanislado Emms, MD 06/19/2021  1:30 PM

## 2021-06-19 NOTE — Progress Notes (Signed)
Occupational Therapy Treatment Patient Details Name: Andres Braun MRN: [id number] DOB: May 13, 1975 Today's Date: 06/19/2021   History of present illness 46 y.o. male admitted 05/24/21 after cardiac arrest at home; pt with agonal breathing, lost pulse, required 20-min CPR and 5 rounds epi. Workup for acute hypoxic respiratory failure, septic shock due to undertermined organism, AKI, acute metabolic encephalopathy, aspiration PNA of bilateral lower lobes due to vomit. ETT 1/15-1/19. CRRT 1/15-1/20. Brain MRI 1/22 negative. Cervical MRI 1/22 with small R-side disc protrusion C5-6. Abdominal CT showed muscular swelling, particularly iliopsoas and gluteal muscles consistent with rhabdomyolysis; hemorrhage noted within R iliopsoas. S/p diagnostic laparoscopy 1/24. MRI L femur 1/25 showed diffuse muscular abnormalities bilateral thighs with heterogenous T2 signal, especially within L glut max and gradratus femoris; findings compatible with dx of rhabdomyolysis, no drainable abscess identified; these findings could contribute to compartment syndrome. Unknown PMH.   OT comments  In person interpreter used- pt motivated this session after allowed time in recliner outside sitting in fresh air. Pt smiling more and asking questions. Pt has social stressors in regards to access to his children that are also weighing heavily on him at this time. Pt frustrated by the pain and lack of LLE movement. Recommendation for CIR. Recommendation for next session to focus on lateral transfers to allow pt to start using w/c level.    Recommendations for follow up therapy are one component of a multi-disciplinary discharge planning process, led by the attending physician.  Recommendations may be updated based on patient status, additional functional criteria and insurance authorization.    Follow Up Recommendations  Acute inpatient rehab (3hours/day)    Assistance Recommended at Discharge Frequent or constant  Supervision/Assistance  Patient can return home with the following  Two people to help with walking and/or transfers;Two people to help with bathing/dressing/bathroom;Assistance with cooking/housework;Assist for transportation;Direct supervision/assist for medications management;Help with stairs or ramp for entrance   Equipment Recommendations  BSC/3in1;Wheelchair (measurements OT);Wheelchair cushion (measurements OT)    Recommendations for Other Services Rehab consult    Precautions / Restrictions Precautions Precautions: Fall Precaution Comments: burning pain in LLE -improved- Noted to have gabapentin in place again since2/9/23 Restrictions Weight Bearing Restrictions: No       Mobility Bed Mobility Overal bed mobility: Needs Assistance Bed Mobility: Supine to Sit     Supine to sit: Min guard     General bed mobility comments: able to progress to EOB with heavy use of bed rail    Transfers Overall transfer level: Needs assistance Equipment used: Ambulation equipment used Transfers: Sit to/from Stand Sit to Stand: Min guard, +2 safety/equipment           General transfer comment: impulsively standing from bed surface into [place]. Able to transfer patient to [facility] in bathroom with Stedy. Patient heavily relying on UEs to pull self up and flexes trunk over bar. Transfer via Lift Equipment: Stedy   Balance Overall balance assessment: Needs assistance Sitting-balance support: Feet supported, Bilateral upper extremity supported Sitting balance-Leahy Scale: Fair     Standing balance support: Bilateral upper extremity supported, Reliant on assistive device for balance, During functional activity Standing balance-Leahy Scale: Poor                             ADL either performed or assessed with clinical judgement   ADL Overall ADL's : Needs assistance/impaired Eating/Feeding: Modified independent Eating/Feeding Details (indicate cue type and reason):  requesting fruit and states not  eating because the food is too salty and concerns for HD needs with too much sodium                     Toilet Transfer: Moderate assistance;BSC/3in1 (stedy) Toilet Transfer Details (indicate cue type and reason): pt requires (A) to control descend to toilet and cues for hand placement to power up   Toileting - Clothing Manipulation Details (indicate cue type and reason): no void at this time. pt expressed needing to void and not having (A) early when he felt he could.       General ADL Comments: pt very flat affect at the beginning of session and discussing social barriers that are stressful for the patient. pt motivated with bathroom transfer and allowed to go outside in chair to sit and have air on his face    Extremity/Trunk Assessment              Vision       Perception     Praxis      Cognition Arousal/Alertness: Awake/alert Behavior During Therapy: Flat affect Overall Cognitive Status: Difficult to assess Area of Impairment: Awareness, Problem solving, Memory, Following commands, Safety/judgement                     Memory: Decreased recall of precautions Following Commands: Follows multi-step commands with increased time Safety/Judgement: Decreased awareness of safety, Decreased awareness of deficits Awareness: Emergent Problem Solving: Slow processing          Exercises      Shoulder Instructions       General Comments VSS    Pertinent Vitals/ Pain       Pain Assessment Pain Assessment: Faces Faces Pain Scale: Hurts little more Pain Location: L LE Pain Descriptors / Indicators: Burning, Grimacing Pain Intervention(s): Monitored during session, Premedicated before session, Repositioned  Home Living                                          Prior Functioning/Environment              Frequency  Min 2X/week        Progress Toward Goals  OT Goals(current goals can now be  found in the care plan section)  Progress towards OT goals: Progressing toward goals  Acute Rehab OT Goals Patient Stated Goal: to be able to be allowed to communicate with his children. ex does not allow him communication time now due to removal of an IPAD and he is very much missing his children. OT Goal Formulation: With patient Time For Goal Achievement: 06/26/21 Potential to Achieve Goals: Good ADL Goals Pt Will Perform Grooming: with set-up;with supervision;sitting Pt Will Perform Upper Body Bathing: with set-up;with supervision;sitting Pt Will Perform Lower Body Bathing: with min assist;with adaptive equipment;sitting/lateral leans Pt Will Transfer to Toilet: with min assist;squat pivot transfer;bedside commode Pt Will Perform Toileting - Clothing Manipulation and hygiene: with min assist;sitting/lateral leans Additional ADL Goal #1: Pt will be Min A in and OOB for basic ADLs  Plan Discharge plan remains appropriate    Co-evaluation    PT/OT/SLP Co-Evaluation/Treatment: Yes Reason for Co-Treatment: Complexity of the patient's impairments (multi-system involvement);Necessary to address cognition/behavior during functional activity;For patient/therapist safety;To address functional/ADL transfers PT goals addressed during session: Mobility/safety with mobility;Strengthening/ROM OT goals addressed during session: ADL's and self-care;Proper use of Adaptive equipment and DME;Strengthening/ROM  AM-PAC OT "6 Clicks" Daily Activity     Outcome Measure   Help from another person eating meals?: None Help from another person taking care of personal grooming?: A Little Help from another person toileting, which includes using toliet, bedpan, or urinal?: A Lot Help from another person bathing (including washing, rinsing, drying)?: A Lot Help from another person to put on and taking off regular upper body clothing?: A Little Help from another person to put on and taking off regular  lower body clothing?: Total 6 Click Score: 15    End of Session    OT Visit Diagnosis: Unsteadiness on feet (R26.81);Other abnormalities of gait and mobility (R26.89);Muscle weakness (generalized) (M62.81);Other symptoms and signs involving cognitive function;Pain Pain - Right/Left: Left Pain - part of body: Hip   Activity Tolerance Patient tolerated treatment well   Patient Left in chair;with call bell/phone within reach;with chair alarm set   Nurse Communication Mobility status;Precautions        Time: 1610-9604 OT Time Calculation (min): 41 min  Charges: OT General Charges $OT Visit: 1 Visit OT Treatments $Self Care/Home Management : 8-22 mins   Brynn, OTR/L  Acute Rehabilitation Services Pager: [phone] Office: [phone] .   Mateo Flow 06/19/2021, 2:07 PM

## 2021-06-19 NOTE — Progress Notes (Signed)
PROGRESS NOTE    Andres EdelsonOscar Diaz Braun  ZOX:[id number] DOB: 12/02/1975 DOA: 05/24/2021 PCP: [name], No   Brief Narrative:  46 year old male admitted to ICU 1/15 under PCCM care after cardiac arrest at home.  PEA arrest, s/p CPR x20 minutes and epi x5.  Patient reportedly out drinking with his friends the night prior to admission.  Found by friends with agonal breathing.  Intubated on arrival, requiring vasopressors, noted to have profound renal failure, rhabdomyolysis with CK in the 14 K range.  Patient initially treated for aspiration pneumonia, placed on CRRT with nephrology.  Patient ultimately extubated 05/28/2021 remains off pressors.  [facility]  Hospital stay complicated by questionable small bowel ischemia and lower extremity neuropathy .  Patient was emergently taking to the OR on 24 January with abnormally thickened bowel but viable tissue.  Imaging reveals profound muscular swelling at the iliopsoas and gluteal muscles with questionable right iliopsoas hemorrhage versus hematoma. IR unable to successfully aspirate the space or perform drain placement. Neurology consulted, unlikely a neurological injury as the neuraxis is negative. Lower extremity paresthesias and weakness continue to slowly improve over the week.  Assessment & Plan:   AKI versus overt renal failure in the setting of profound metabolic acidosis and rhabdomyolysis, improving Nephrology following, appreciate insight and recommendations  CRRT/intermittent dialysis previously required -last session 06/13/2021 Reassuringly urine output continues to improve, labs normalizing   Psoas swelling in the setting of severe rhabdo, unlikely abscess (HCC) versus hematoma, POA IR unable to drain or aspirate this area ICultures remain negative, leukocytosis previously downtrending (currently elevated x1 - follow trend) Blood cultures negative to date ID following - Continue with current antibiotics cefepime and  daptomycin - likely require prolonged antibiotics through the end of the month per ID recs.  Bilateral lower extremity weakness/pain, improving MRI of the neuraxis  was unremarkable for acute process .  Likely neurological/ lumbar plexus ischemic injury due to previous swelling/compression  Transition off Dilaudid to gabapentin for neuropathic pain control Continues to improve and build strength and sensation over the past 24 hours, follow-up PT recommendations for placement  Cardiac arrest with pulseless electrical activity (HCC) S/p PEA arrest, s/p CPR x 20 minutes and Epi x 5. 2/7 now stable     Hyponatremia, hypervolemic, improving with dialysis Resolved post HD - WNL   Thrombocytosis Reactive given above   Acute posthemorrhagic anemia s/p 2 unit prbc (1/31,2/5) -continue to follow clinically S/p iv iron 2/6 On Aranesp   Elevated liver enzymes Probably from ETOH abuse, shock liver and rhabdomyolysis.  Much improved   Protein-calorie malnutrition, severe Supplement was added   Aspiration pneumonia of both lower lobes due to vomit (HCC)- (present on admission) Completed course of abx   Acute metabolic encephalopathy- (present on admission) Resolved, at baseline   Septic shock due to undetermined organism [facility](HCC)- (present on admission) Vital stable   Acute respiratory failure with hypoxia and hypercapnia (HCC)- (present on admission) Patient intubated on 05/24/2021 and extubated on 05/28/2021 .  patient completed course of antibiotics for aspiration pneumonia  On RA   DVT prophylaxis: heparin Code Status:full Family Communication: none  Status is: Inpatient  Dispo: The patient is from: Home              Anticipated d/c is to: To be determined              Anticipated d/c date is: 40 to 72 hours  Patient currently not medically stable for discharge  Consultants:  Nephrology, PCCM, neurology, neurosurgery, orthopedic surgery  Procedures:  Exploratory  laparoscopy as above  Antimicrobials:  Cefepime, daptomycin ongoing per ID  Subjective: Leg pain well controlled overnight improved with Dilaudid, transition to gabapentin today given neuropathic pain  Objective: Vitals:   06/18/21 2034 06/19/21 0032 06/19/21 0434 06/19/21 1001  BP: (!) 129/93 (!) 140/93 (!) 144/86 129/90  Pulse: (!) 125 (!) 115 (!) 124 (!) 122  Resp: 18 17 19 17   Temp: 98.5 F (36.9 C) 98 F (36.7 C) 99.9 F (37.7 C) 99.6 F (37.6 C)  TempSrc: Oral Oral  Oral  SpO2: 100% 100% 100% 99%  Weight:      Height:        Intake/Output Summary (Last 24 hours) at 06/19/2021 1249 Last data filed at 06/19/2021 08/17/2021 Gross per 24 hour  Intake 2688.25 ml  Output 4300 ml  Net -1611.75 ml    Filed Weights   06/13/21 0506 06/14/21 0536 06/18/21 0500  Weight: 86.8 kg 83.2 kg 84 kg    Examination:  General:  Pleasantly resting in bed, No acute distress. HEENT:  Normocephalic atraumatic.  Sclerae nonicteric, noninjected.  Extraocular movements intact bilaterally. Neck:  Without mass or deformity. Trachea is midline. Lungs:  Clear to auscultate bilaterally without rhonchi, wheeze, or rales. Heart:  Regular rate and rhythm.  Without murmurs, rubs, or gallops. Abdomen:  Soft, nontender, nondistended.  Without guarding or rebound. Extremities: Without cyanosis, clubbing, edema, or obvious deformity.  Diminished sensation distally over the lower extremities and plantar surfaces, left foot difficulty dorsiflexion Vascular:  Dorsalis pedis and posterior tibial pulses palpable bilaterally. Skin:  Warm and dry, no erythema, no ulcerations.  Data Reviewed: I have personally reviewed following labs and imaging studies  CBC: Recent Labs  Lab 06/13/21 0830 06/14/21 1125 06/15/21 0842 06/18/21 0500  WBC 12.0* 10.0 9.4 15.8*  HGB 7.2* 6.7* 8.2* 8.3*  HCT 22.7* 22.2* 26.7* 26.5*  MCV 86.3 88.4 85.3 87.2  PLT 696* 637* 650* 797*    Basic Metabolic Panel: Recent Labs  Lab  06/15/21 0337 06/16/21 0344 06/17/21 0415 06/18/21 0500 06/19/21 0408  NA 132* 136 138 135 136  K 3.6 3.5 3.4* 4.1 3.9  CL 97* 102 104 103 104  CO2 24 24 23 22  21*  GLUCOSE 95 100* 98 112* 105*  BUN 50* 57* 51* 47* 41*  CREATININE 5.40* 5.13* 4.33* 3.43* 2.70*  CALCIUM 8.2* 8.4* 8.6* 8.6* 8.7*  PHOS 5.0* 4.6 3.9 3.4 3.4    GFR: Estimated Creatinine Clearance: 37.9 mL/min (A) (by C-G formula based on SCr of 2.7 mg/dL (H)). Liver Function Tests: Recent Labs  Lab 06/15/21 0337 06/16/21 0344 06/17/21 0415 06/18/21 0500 06/19/21 0408  ALBUMIN 1.6* 2.2* 2.0* 2.1* 2.2*    No results for input(s): LIPASE, AMYLASE in the last 168 hours. No results for input(s): AMMONIA in the last 168 hours. Coagulation Profile: No results for input(s): INR, PROTIME in the last 168 hours. Cardiac Enzymes: Recent Labs  Lab 06/16/21 0344 06/18/21 0500  CKTOTAL 88 103    BNP (last 3 results) No results for input(s): PROBNP in the last 8760 hours. HbA1C: No results for input(s): HGBA1C in the last 72 hours. CBG: No results for input(s): GLUCAP in the last 168 hours. Lipid Profile: No results for input(s): CHOL, HDL, LDLCALC, TRIG, CHOLHDL, LDLDIRECT in the last 72 hours. Thyroid Function Tests: No results for input(s): TSH, T4TOTAL, FREET4, T3FREE, THYROIDAB in the last  72 hours. Anemia Panel: No results for input(s): VITAMINB12, FOLATE, FERRITIN, TIBC, IRON, RETICCTPCT in the last 72 hours. Sepsis Labs: No results for input(s): PROCALCITON, LATICACIDVEN in the last 168 hours.  Recent Results (from the past 240 hour(s))  Urine Culture     Status: Abnormal   Collection Time: 06/10/21  6:37 PM   Specimen: Urine, Clean Catch  Result Value Ref Range Status   Specimen Description URINE, CLEAN CATCH  Final   Special Requests   Final    NONE Performed at [facility], [address], [place], [place] [address]    Culture 20,000 COLONIES/mL PSEUDOMONAS AERUGINOSA (A)  Final    Report Status 06/13/2021 FINAL  Final   Organism ID, Bacteria PSEUDOMONAS AERUGINOSA (A)  Final      Susceptibility   Pseudomonas aeruginosa - MIC*    CEFTAZIDIME 4 SENSITIVE Sensitive     CIPROFLOXACIN <=0.25 SENSITIVE Sensitive     GENTAMICIN <=1 SENSITIVE Sensitive     IMIPENEM 1 SENSITIVE Sensitive     PIP/TAZO 8 SENSITIVE Sensitive     CEFEPIME 2 SENSITIVE Sensitive     * 20,000 COLONIES/mL PSEUDOMONAS AERUGINOSA  Culture, blood (routine x 2)     Status: None   Collection Time: 06/10/21  9:02 PM   Specimen: BLOOD  Result Value Ref Range Status   Specimen Description BLOOD LEFT ANTECUBITAL  Final   Special Requests   Final    BOTTLES DRAWN AEROBIC AND ANAEROBIC Blood Culture adequate volume   Culture   Final    NO GROWTH 5 DAYS Performed at [facility], [address] [place], [place] [address]    Report Status 06/15/2021 FINAL  Final  Culture, blood (routine x 2)     Status: None   Collection Time: 06/10/21  9:02 PM   Specimen: BLOOD  Result Value Ref Range Status   Specimen Description BLOOD RIGHT ANTECUBITAL  Final   Special Requests   Final    BOTTLES DRAWN AEROBIC AND ANAEROBIC Blood Culture results may not be optimal due to an inadequate volume of blood received in culture bottles   Culture   Final    NO GROWTH 5 DAYS Performed at [facility], [address], [place], [place] [address]    Report Status 06/15/2021 FINAL  Final  Resp Panel by RT-PCR (Flu A&B, Covid) Nasopharyngeal Swab     Status: None   Collection Time: 06/14/21 10:16 AM   Specimen: Nasopharyngeal Swab; Nasopharyngeal(NP) swabs in vial transport medium  Result Value Ref Range Status   SARS Coronavirus 2 by RT PCR NEGATIVE NEGATIVE Final    Comment: (NOTE) SARS-CoV-2 target nucleic acids are NOT DETECTED.  The SARS-CoV-2 RNA is generally detectable in upper respiratory specimens during the acute phase of infection. The lowest concentration of SARS-CoV-2 viral copies this assay can  detect is 138 copies/mL. A negative result does not preclude SARS-Cov-2 infection and should not be used as the sole basis for treatment or other patient management decisions. A negative result may occur with  improper specimen collection/handling, submission of specimen other than nasopharyngeal swab, presence of viral mutation(s) within the areas targeted by this assay, and inadequate number of viral copies(<138 copies/mL). A negative result must be combined with clinical observations, patient history, and epidemiological information. The expected result is Negative.  Fact Sheet for Patients:  BloggerCourse.com  Fact Sheet for Healthcare Providers:  SeriousBroker.it  This test is no t yet approved or cleared by the  [place] [employer] and  has been authorized for detection and/or diagnosis of SARS-CoV-2 by FDA under an TEFL teacher (EUA). This EUA will remain  in effect (meaning this test can be used) for the duration of the COVID-19 declaration under Section 564(b)(1) of the Act, 21 U.S.C.section 360bbb-3(b)(1), unless the authorization is terminated  or revoked sooner.       Influenza A by PCR NEGATIVE NEGATIVE Final   Influenza B by PCR NEGATIVE NEGATIVE Final    Comment: (NOTE) The Xpert Xpress SARS-CoV-2/FLU/RSV plus assay is intended as an aid in the diagnosis of influenza from Nasopharyngeal swab specimens and should not be used as a sole basis for treatment. Nasal washings and aspirates are unacceptable for Xpert Xpress SARS-CoV-2/FLU/RSV testing.  Fact Sheet for Patients: BloggerCourse.com  Fact Sheet for Healthcare Providers: SeriousBroker.it  This test is not yet approved or cleared by the [place] FDA and has been authorized for detection and/or diagnosis of SARS-CoV-2 by FDA under an Emergency Use Authorization (EUA). This EUA will remain in  effect (meaning this test can be used) for the duration of the COVID-19 declaration under Section 564(b)(1) of the Act, 21 U.S.C. section 360bbb-3(b)(1), unless the authorization is terminated or revoked.  Performed at [facility], [address] [place], [place] [address]           Radiology Studies: No results found.      Scheduled Meds:  (feeding supplement) PROSource Plus  30 mL Oral BID BM   amLODipine  5 mg Oral Daily   calcium acetate  1,334 mg Oral TID WC   Chlorhexidine Gluconate Cloth  6 each Topical Q0600   feeding supplement (NEPRO CARB STEADY)  237 mL Oral QHS   folic acid  1 mg Per Tube Daily   gabapentin  200 mg Oral TID   Gerhardt's butt cream   Topical TID   heparin injection (subcutaneous)  5,000 Units Subcutaneous Q8H   multivitamin  1 tablet Oral QHS   pantoprazole  40 mg Oral BID   Continuous Infusions:  sodium chloride Stopped (05/29/21 1602)   sodium chloride 75 mL/hr at 06/18/21 1813   ceFEPime (MAXIPIME) IV 2 g (06/19/21 1287)   DAPTOmycin (CUBICIN)  IV 700 mg (06/18/21 1132)     LOS: 26 days   Time spent:  Azucena Fallen, DO [facility]  If 7PM-7AM, please contact night-coverage [email or web address]  06/19/2021, 12:49 PM

## 2021-06-19 NOTE — Progress Notes (Signed)
Pharmacy Antibiotic Note  Andres Braun is a 46 y.o. male admitted on 05/24/2021 and how with concern for myositis/psoas abscess. Pharmacy has been consulted for Daptomycin dosing.   The patient's renal function is improving with increasing UOP and trend down in SCr without the use of HD to 2.7 << 3.43. CK on 2/9 was wnl and stable. Will adjust Cefepime dosing again today with renal improvement.   Plan: - Continue Daptomycin to 700 mg (~8 mg/kg) every 24h - Adjust Cefepime to 2g IV every 12 hours - Will continue to follow renal function, culture results, LOT, and antibiotic de-escalation plans    Height: 6' (182.9 cm) Weight: 84 kg (185 lb 3 oz) IBW/kg (Calculated) : 77.6  Temp (24hrs), Avg:99.1 F (37.3 C), Min:98 F (36.7 C), Max:99.9 F (37.7 C)  Recent Labs  Lab 06/13/21 0830 06/14/21 0048 06/14/21 1125 06/15/21 0337 06/15/21 0842 06/16/21 0344 06/17/21 0415 06/18/21 0500 06/19/21 0408  WBC 12.0*  --  10.0  --  9.4  --   --  15.8*  --   CREATININE  --    < >  --  5.40*  --  5.13* 4.33* 3.43* 2.70*   < > = values in this interval not displayed.     Estimated Creatinine Clearance: 37.9 mL/min (A) (by C-G formula based on SCr of 2.7 mg/dL (H)).    No Known Allergies  Antimicrobials this admission: CTX 1/15 >> 1/23 Flagyl 1/21 >> 1/23 Zosyn 1/24 >> 1/27 Cefazolin 1/27 >> 1/31 Vancomycin 2/1 x 1 Daptomycin 2/3 >> Cefepime 2/4 >>  Dose adjustments this admission: N/a  Microbiology results: 1/15 COVID/flu >> neg 1/15 MRSA PCR >> neg 1/23 GI panel << neg 1/29 CDiff >> neg 1/29 GI panel >> neg 1/29 BCx >> NGf 2/1 BCx >> ngx2d 2/1 UCx >> 20k PSA  Thank you for allowing pharmacy to be a part of this patients care.  Alycia Rossetti, PharmD, BCPS Infectious Diseases Clinical Pharmacist 06/19/2021 8:22 AM   **Pharmacist phone directory can now be found on [email or web address] ([email or web address]).  Listed under [place].

## 2021-06-19 NOTE — Progress Notes (Signed)
Physical Therapy Treatment Patient Details Name: Andres Braun MRN: [id number] DOB: 13-Feb-1976 Today's Date: 06/19/2021   History of Present Illness 46 y.o. male admitted 05/24/21 after cardiac arrest at home; pt with agonal breathing, lost pulse, required 20-min CPR and 5 rounds epi. Workup for acute hypoxic respiratory failure, septic shock due to undertermined organism, AKI, acute metabolic encephalopathy, aspiration PNA of bilateral lower lobes due to vomit. ETT 1/15-1/19. CRRT 1/15-1/20. Brain MRI 1/22 negative. Cervical MRI 1/22 with small R-side disc protrusion C5-6. Abdominal CT showed muscular swelling, particularly iliopsoas and gluteal muscles consistent with rhabdomyolysis; hemorrhage noted within R iliopsoas. S/p diagnostic laparoscopy 1/24. MRI L femur 1/25 showed diffuse muscular abnormalities bilateral thighs with heterogenous T2 signal, especially within L glut max and gradratus femoris; findings compatible with dx of rhabdomyolysis, no drainable abscess identified; these findings could contribute to compartment syndrome. Unknown PMH.    PT Comments    Patient with flat affect and depressed mood. Patient expressing frustration about children situation and ex-girlfriend and inability to contact or see them. Patient continues to be limited by LLE weakness and burning sensation. With use of Stedy and heavy use of Ues, patient able to stand with min guard and trunk flexed over bar. Transferred to [facility] in bathroom then to recliner via Stedy. Continue to recommend SNF for ongoing Physical Therapy.  Next session will work on lateral scoot transfer into w/c to work towards progressing patient home at w/c level if able.     Recommendations for follow up therapy are one component of a multi-disciplinary discharge planning process, led by the attending physician.  Recommendations may be updated based on patient status, additional functional criteria and insurance authorization.  Follow Up  Recommendations  Skilled nursing-short term rehab (<3 hours/day)     Assistance Recommended at Discharge Frequent or constant Supervision/Assistance  Patient can return home with the following Help with stairs or ramp for entrance;A lot of help with bathing/dressing/bathroom;Assistance with cooking/housework;Assist for transportation;Direct supervision/assist for financial management;Direct supervision/assist for medications management;A lot of help with walking and/or transfers   Equipment Recommendations  Wheelchair (measurements PT);Wheelchair cushion (measurements PT);Other (comment);Hospital bed;BSC/3in1    Recommendations for Other Services       Precautions / Restrictions Precautions Precautions: Fall Precaution Comments: burning pain in LLE -improved Restrictions Weight Bearing Restrictions: No     Mobility  Bed Mobility Overal bed mobility: Needs Assistance Bed Mobility: Supine to Sit     Supine to sit: Min guard     General bed mobility comments: able to progress to EOB with heavy use of bed rail    Transfers Overall transfer level: Needs assistance Equipment used: Ambulation equipment used Transfers: Sit to/from Stand Sit to Stand: Min guard, +2 safety/equipment           General transfer comment: impulsively standing from bed surface into [place]. Able to transfer patient to [facility] in bathroom with Stedy. Patient heavily relying on UEs to pull self up and flexes trunk over bar. Transfer via Lift Equipment: Stedy  Ambulation/Gait                   Stairs             Wheelchair Mobility    Modified Rankin (Stroke Patients Only)       Balance Overall balance assessment: Needs assistance Sitting-balance support: Feet supported, Bilateral upper extremity supported Sitting balance-Leahy Scale: Fair     Standing balance support: Bilateral upper extremity supported, Reliant on assistive device for  balance, During functional  activity Standing balance-Leahy Scale: Poor                              Cognition Arousal/Alertness: Awake/alert Behavior During Therapy: Flat affect Overall Cognitive Status: Difficult to assess Area of Impairment: Awareness, Problem solving, Memory, Following commands, Safety/judgement                     Memory: Decreased recall of precautions Following Commands: Follows multi-step commands with increased time Safety/Judgement: Decreased awareness of safety, Decreased awareness of deficits Awareness: Emergent Problem Solving: Slow processing          Exercises      General Comments        Pertinent Vitals/Pain Pain Assessment Pain Assessment: Faces Faces Pain Scale: Hurts little more Pain Location: L LE Pain Descriptors / Indicators: Burning, Grimacing Pain Intervention(s): Monitored during session, Repositioned    Home Living                          Prior Function            PT Goals (current goals can now be found in the care plan section) Acute Rehab PT Goals Patient Stated Goal: to do more PT Goal Formulation: With patient Time For Goal Achievement: 07/01/21 Potential to Achieve Goals: Fair Progress towards PT goals: Progressing toward goals    Frequency    Min 3X/week      PT Plan Current plan remains appropriate    Co-evaluation PT/OT/SLP Co-Evaluation/Treatment: Yes Reason for Co-Treatment: For patient/therapist safety;To address functional/ADL transfers PT goals addressed during session: Mobility/safety with mobility;Strengthening/ROM        AM-PAC PT "6 Clicks" Mobility   Outcome Measure  Help needed turning from your back to your side while in a flat bed without using bedrails?: A Little Help needed moving from lying on your back to sitting on the side of a flat bed without using bedrails?: A Little Help needed moving to and from a bed to a chair (including a wheelchair)?: Total Help needed standing  up from a chair using your arms (e.g., wheelchair or bedside chair)?: Total Help needed to walk in hospital room?: Total Help needed climbing 3-5 steps with a railing? : Total 6 Click Score: 10    End of Session   Activity Tolerance: Patient tolerated treatment well Patient left: in chair;with chair alarm set;with call bell/phone within reach Nurse Communication: Mobility status PT Visit Diagnosis: Muscle weakness (generalized) (M62.81);Difficulty in walking, not elsewhere classified (R26.2);Unsteadiness on feet (R26.81)     Time: 9480-1655 PT Time Calculation (min) (ACUTE ONLY): 52 min  Charges:  $Therapeutic Activity: 23-37 mins                     Rogene Meth A. Dan Humphreys PT, DPT Acute Rehabilitation Services Pager [phone] Office [phone]    Viviann Spare 06/19/2021, 1:25 PM

## 2021-06-20 LAB — RENAL FUNCTION PANEL
Albumin: 2.1 g/dL — ABNORMAL LOW (ref 3.5–5.0)
Anion gap: 10 (ref 5–15)
BUN: 37 mg/dL — ABNORMAL HIGH (ref 6–20)
CO2: 20 mmol/L — ABNORMAL LOW (ref 22–32)
Calcium: 9.1 mg/dL (ref 8.9–10.3)
Chloride: 107 mmol/L (ref 98–111)
Creatinine, Ser: 2.41 mg/dL — ABNORMAL HIGH (ref 0.61–1.24)
GFR, Estimated: 33 mL/min — ABNORMAL LOW (ref >60)
Glucose, Bld: 102 mg/dL — ABNORMAL HIGH (ref 70–99)
Phosphorus: 3.3 mg/dL (ref 2.5–4.6)
Potassium: 4.1 mmol/L (ref 3.5–5.1)
Sodium: 137 mmol/L (ref 135–145)

## 2021-06-20 NOTE — Progress Notes (Signed)
Patient alert and oriented. No acute distress noted. Team aware that patient can become tachycardic at times. Will continue to monitor closely.         Note Details  Author Valetta Fuller, RN File Time 06/20/2021 11:25 PM  Author Type Registered Nurse Status Signed  Last Editor Valetta Fuller, [facility] Acct # [id number] Admit Date 05/24/2021    06/20/21 2046  Vitals  Temp 97.6 F (36.4 C)  Temp Source Oral  BP (!) 127/92  MAP (mmHg) 102  BP Method Automatic  Patient Position (if appropriate) Lying  Pulse Rate (!) 125  Pulse Rate Source Monitor  Resp 17  Level of Consciousness  Level of Consciousness Alert  MEWS COLOR  MEWS Score Color Yellow  Oxygen Therapy  SpO2 99 %  MEWS Score  MEWS Temp 0  MEWS Systolic 0  MEWS Pulse 2  MEWS RR 0  MEWS LOC 0  MEWS Score 2

## 2021-06-20 NOTE — Progress Notes (Signed)
PROGRESS NOTE    Andres Braun  ZOX:[id number] DOB: 04/01/1976 DOA: 05/24/2021 PCP: [name], No   Brief Narrative:  46 year old male admitted to ICU 1/15 under PCCM care after cardiac arrest at home.  PEA arrest, s/p CPR x20 minutes and epi x5.  Patient reportedly out drinking with his friends the night prior to admission.  Found by friends with agonal breathing.  Intubated on arrival, requiring vasopressors, noted to have profound renal failure, rhabdomyolysis with CK in the 14 K range.  Patient initially treated for aspiration pneumonia, placed on CRRT with nephrology.  Patient ultimately extubated 05/28/2021 remains off pressors.  [facility]  Hospital stay complicated by questionable small bowel ischemia and lower extremity neuropathy .  Patient was emergently taking to the OR on 24 January with abnormally thickened bowel but viable tissue.  Imaging reveals profound muscular swelling at the iliopsoas and gluteal muscles with questionable right iliopsoas hemorrhage versus hematoma. IR unable to successfully aspirate the space or perform drain placement. Neurology consulted, unlikely a neurological injury as the neuraxis is negative. Lower extremity paresthesias and weakness continue to slowly improve over the week.  Assessment & Plan:   AKI versus overt renal failure in the setting of profound metabolic acidosis and rhabdomyolysis, improving Nephrology signed off given marked improvemnt, appreciate insight and recommendations  CRRT/intermittent dialysis previously required -last session 06/13/2021 Reassuringly urine output continues to improve, labs normalizing   Psoas swelling in the setting of severe rhabdo, unlikely abscess (HCC) versus hematoma, POA IR unable to drain or aspirate this area ICultures remain negative, leukocytosis previously downtrending (currently elevated x1 - follow trend) Blood cultures negative to date ID following - Continue with current  antibiotics cefepime and daptomycin - likely require prolonged antibiotics through the end of the month per ID recs.  Bilateral lower extremity weakness/pain, improving MRI of the neuraxis  was unremarkable for acute process .  Likely neurological/ lumbar plexus ischemic injury due to previous swelling/compression  Transition off Dilaudid to gabapentin for neuropathic pain control Continues to improve and build strength and sensation over the past 24 hours, follow-up PT recommendations for placement  Cardiac arrest with pulseless electrical activity (HCC) S/p PEA arrest, s/p CPR x 20 minutes and Epi x 5. 2/7 now stable     Hyponatremia, hypervolemic, improving with dialysis Resolved post HD - WNL   Thrombocytosis Reactive given above   Acute posthemorrhagic anemia s/p 2 unit prbc (1/31,2/5) -continue to follow clinically S/p iv iron 2/6 On Aranesp   Elevated liver enzymes Probably from ETOH abuse, shock liver and rhabdomyolysis.  Much improved   Protein-calorie malnutrition, severe Supplement was added   Aspiration pneumonia of both lower lobes due to vomit (HCC)- (present on admission) Completed course of abx   Acute metabolic encephalopathy- (present on admission) Resolved, at baseline   Septic shock due to undetermined organism [facility](HCC)- (present on admission) Vital stable   Acute respiratory failure with hypoxia and hypercapnia (HCC)- (present on admission) Patient intubated on 05/24/2021 and extubated on 05/28/2021 .  patient completed course of antibiotics for aspiration pneumonia  On RA   DVT prophylaxis: heparin Code Status:full Family Communication: none  Status is: Inpatient  Dispo: The patient is from: Home              Anticipated d/c is to: To be determined              Anticipated d/c date is: 72+ hours  Patient currently not medically stable for discharge  Consultants:  Nephrology, PCCM, neurology, neurosurgery, orthopedic  surgery  Procedures:  Exploratory laparoscopy as above  Antimicrobials:  Cefepime, daptomycin ongoing per ID  Subjective: Leg pain pain improving on gabapentin today given neuropathic pain  Objective: Vitals:   06/19/21 2109 06/20/21 0000 06/20/21 0122 06/20/21 0535  BP: (!) 137/93  131/90 127/86  Pulse: (!) 131 (!) 115 (!) 121 (!) 120  Resp: 18  18 19   Temp: (!) 100.7 F (38.2 C) 99 F (37.2 C) 97.7 F (36.5 C) 98.7 F (37.1 C)  TempSrc: Oral  Oral Oral  SpO2: 99%  98% 100%  Weight:      Height:        Intake/Output Summary (Last 24 hours) at 06/20/2021 0720 Last data filed at 06/20/2021 0600 Gross per 24 hour  Intake 1550.59 ml  Output 3300 ml  Net -1749.41 ml    Filed Weights   06/13/21 0506 06/14/21 0536 06/18/21 0500  Weight: 86.8 kg 83.2 kg 84 kg    Examination:  General:  Pleasantly resting in bed, No acute distress. HEENT:  Normocephalic atraumatic.  Sclerae nonicteric, noninjected.  Extraocular movements intact bilaterally. Neck:  Without mass or deformity. Trachea is midline. Lungs:  Clear to auscultate bilaterally without rhonchi, wheeze, or rales. Heart:  Regular rate and rhythm.  Without murmurs, rubs, or gallops. Abdomen:  Soft, nontender, nondistended.  Without guarding or rebound. Extremities: Without cyanosis, clubbing, edema, or obvious deformity.  Diminished sensation distally over the lower extremities and plantar surfaces, left foot difficulty dorsiflexion Vascular:  Dorsalis pedis and posterior tibial pulses palpable bilaterally. Skin:  Warm and dry, no erythema, no ulcerations.  Data Reviewed: I have personally reviewed following labs and imaging studies  CBC: Recent Labs  Lab 06/13/21 0830 06/14/21 1125 06/15/21 0842 06/18/21 0500  WBC 12.0* 10.0 9.4 15.8*  HGB 7.2* 6.7* 8.2* 8.3*  HCT 22.7* 22.2* 26.7* 26.5*  MCV 86.3 88.4 85.3 87.2  PLT 696* 637* 650* 797*    Basic Metabolic Panel: Recent Labs  Lab 06/16/21 0344  06/17/21 0415 06/18/21 0500 06/19/21 0408 06/20/21 0145  NA 136 138 135 136 137  K 3.5 3.4* 4.1 3.9 4.1  CL 102 104 103 104 107  CO2 24 23 22  21* 20*  GLUCOSE 100* 98 112* 105* 102*  BUN 57* 51* 47* 41* 37*  CREATININE 5.13* 4.33* 3.43* 2.70* 2.41*  CALCIUM 8.4* 8.6* 8.6* 8.7* 9.1  PHOS 4.6 3.9 3.4 3.4 3.3    GFR: Estimated Creatinine Clearance: 42.5 mL/min (A) (by C-G formula based on SCr of 2.41 mg/dL (H)). Liver Function Tests: Recent Labs  Lab 06/16/21 0344 06/17/21 0415 06/18/21 0500 06/19/21 0408 06/20/21 0145  ALBUMIN 2.2* 2.0* 2.1* 2.2* 2.1*    No results for input(s): LIPASE, AMYLASE in the last 168 hours. No results for input(s): AMMONIA in the last 168 hours. Coagulation Profile: No results for input(s): INR, PROTIME in the last 168 hours. Cardiac Enzymes: Recent Labs  Lab 06/16/21 0344 06/18/21 0500  CKTOTAL 88 103    BNP (last 3 results) No results for input(s): PROBNP in the last 8760 hours. HbA1C: No results for input(s): HGBA1C in the last 72 hours. CBG: No results for input(s): GLUCAP in the last 168 hours. Lipid Profile: No results for input(s): CHOL, HDL, LDLCALC, TRIG, CHOLHDL, LDLDIRECT in the last 72 hours. Thyroid Function Tests: No results for input(s): TSH, T4TOTAL, FREET4, T3FREE, THYROIDAB in the last 72 hours. Anemia Panel: No results  for input(s): VITAMINB12, FOLATE, FERRITIN, TIBC, IRON, RETICCTPCT in the last 72 hours. Sepsis Labs: No results for input(s): PROCALCITON, LATICACIDVEN in the last 168 hours.  Recent Results (from the past 240 hour(s))  Urine Culture     Status: Abnormal   Collection Time: 06/10/21  6:37 PM   Specimen: Urine, Clean Catch  Result Value Ref Range Status   Specimen Description URINE, CLEAN CATCH  Final   Special Requests   Final    NONE Performed at [facility], [address], [place], [place] [address]    Culture 20,000 COLONIES/mL PSEUDOMONAS AERUGINOSA (A)  Final   Report Status  06/13/2021 FINAL  Final   Organism ID, Bacteria PSEUDOMONAS AERUGINOSA (A)  Final      Susceptibility   Pseudomonas aeruginosa - MIC*    CEFTAZIDIME 4 SENSITIVE Sensitive     CIPROFLOXACIN <=0.25 SENSITIVE Sensitive     GENTAMICIN <=1 SENSITIVE Sensitive     IMIPENEM 1 SENSITIVE Sensitive     PIP/TAZO 8 SENSITIVE Sensitive     CEFEPIME 2 SENSITIVE Sensitive     * 20,000 COLONIES/mL PSEUDOMONAS AERUGINOSA  Culture, blood (routine x 2)     Status: None   Collection Time: 06/10/21  9:02 PM   Specimen: BLOOD  Result Value Ref Range Status   Specimen Description BLOOD LEFT ANTECUBITAL  Final   Special Requests   Final    BOTTLES DRAWN AEROBIC AND ANAEROBIC Blood Culture adequate volume   Culture   Final    NO GROWTH 5 DAYS Performed at [facility], [address], [place], [place] [address]    Report Status 06/15/2021 FINAL  Final  Culture, blood (routine x 2)     Status: None   Collection Time: 06/10/21  9:02 PM   Specimen: BLOOD  Result Value Ref Range Status   Specimen Description BLOOD RIGHT ANTECUBITAL  Final   Special Requests   Final    BOTTLES DRAWN AEROBIC AND ANAEROBIC Blood Culture results may not be optimal due to an inadequate volume of blood received in culture bottles   Culture   Final    NO GROWTH 5 DAYS Performed at [facility], [address] [place], [place] [address]    Report Status 06/15/2021 FINAL  Final  Resp Panel by RT-PCR (Flu A&B, Covid) Nasopharyngeal Swab     Status: None   Collection Time: 06/14/21 10:16 AM   Specimen: Nasopharyngeal Swab; Nasopharyngeal(NP) swabs in vial transport medium  Result Value Ref Range Status   SARS Coronavirus 2 by RT PCR NEGATIVE NEGATIVE Final    Comment: (NOTE) SARS-CoV-2 target nucleic acids are NOT DETECTED.  The SARS-CoV-2 RNA is generally detectable in upper respiratory specimens during the acute phase of infection. The lowest concentration of SARS-CoV-2 viral copies this assay can detect is 138  copies/mL. A negative result does not preclude SARS-Cov-2 infection and should not be used as the sole basis for treatment or other patient management decisions. A negative result may occur with  improper specimen collection/handling, submission of specimen other than nasopharyngeal swab, presence of viral mutation(s) within the areas targeted by this assay, and inadequate number of viral copies(<138 copies/mL). A negative result must be combined with clinical observations, patient history, and epidemiological information. The expected result is Negative.  Fact Sheet for Patients:  BloggerCourse.com  Fact Sheet for Healthcare Providers:  SeriousBroker.it  This test is no t yet approved or cleared by the [place] FDA and  has  been authorized for detection and/or diagnosis of SARS-CoV-2 by FDA under an Emergency Use Authorization (EUA). This EUA will remain  in effect (meaning this test can be used) for the duration of the COVID-19 declaration under Section 564(b)(1) of the Act, 21 U.S.C.section 360bbb-3(b)(1), unless the authorization is terminated  or revoked sooner.       Influenza A by PCR NEGATIVE NEGATIVE Final   Influenza B by PCR NEGATIVE NEGATIVE Final    Comment: (NOTE) The Xpert Xpress SARS-CoV-2/FLU/RSV plus assay is intended as an aid in the diagnosis of influenza from Nasopharyngeal swab specimens and should not be used as a sole basis for treatment. Nasal washings and aspirates are unacceptable for Xpert Xpress SARS-CoV-2/FLU/RSV testing.  Fact Sheet for Patients: BloggerCourse.com  Fact Sheet for Healthcare Providers: SeriousBroker.it  This test is not yet approved or cleared by the [place] FDA and has been authorized for detection and/or diagnosis of SARS-CoV-2 by FDA under an Emergency Use Authorization (EUA). This EUA will remain in effect (meaning  this test can be used) for the duration of the COVID-19 declaration under Section 564(b)(1) of the Act, 21 U.S.C. section 360bbb-3(b)(1), unless the authorization is terminated or revoked.  Performed at [facility], [address] [place], [place] [address]           Radiology Studies: No results found.      Scheduled Meds:  (feeding supplement) PROSource Plus  30 mL Oral BID BM   amLODipine  5 mg Oral Daily   calcium acetate  1,334 mg Oral TID WC   Chlorhexidine Gluconate Cloth  6 each Topical Q0600   feeding supplement (NEPRO CARB STEADY)  237 mL Oral QHS   folic acid  1 mg Per Tube Daily   gabapentin  200 mg Oral TID   Gerhardt's butt cream   Topical TID   heparin injection (subcutaneous)  5,000 Units Subcutaneous Q8H   multivitamin  1 tablet Oral QHS   pantoprazole  40 mg Oral BID   Continuous Infusions:  sodium chloride Stopped (05/29/21 1602)   sodium chloride 75 mL/hr at 06/19/21 2340   ceFEPime (MAXIPIME) IV 2 g (06/19/21 2341)   DAPTOmycin (CUBICIN)  IV 700 mg (06/19/21 2151)     LOS: 27 days   Time spent:  Azucena Fallen, DO [facility]  If 7PM-7AM, please contact night-coverage [email or web address]  06/20/2021, 7:20 AM

## 2021-06-20 NOTE — Progress Notes (Signed)
Patient alert and oriented. No acute distress noted. Team aware that patient can become tachycardic at times. Will continue to monitor closely.

## 2021-06-21 LAB — RENAL FUNCTION PANEL
Albumin: 2.2 g/dL — ABNORMAL LOW (ref 3.5–5.0)
Anion gap: 12 (ref 5–15)
BUN: 38 mg/dL — ABNORMAL HIGH (ref 6–20)
CO2: 20 mmol/L — ABNORMAL LOW (ref 22–32)
Calcium: 9.2 mg/dL (ref 8.9–10.3)
Chloride: 104 mmol/L (ref 98–111)
Creatinine, Ser: 2.23 mg/dL — ABNORMAL HIGH (ref 0.61–1.24)
GFR, Estimated: 36 mL/min — ABNORMAL LOW (ref >60)
Glucose, Bld: 116 mg/dL — ABNORMAL HIGH (ref 70–99)
Phosphorus: 3.8 mg/dL (ref 2.5–4.6)
Potassium: 4.1 mmol/L (ref 3.5–5.1)
Sodium: 136 mmol/L (ref 135–145)

## 2021-06-21 LAB — CK: Total CK: 95 U/L (ref 49–397)

## 2021-06-21 NOTE — Progress Notes (Signed)
PROGRESS NOTE    Andres Braun  QQI:[id number] DOB: [birth year] DOA: 05/24/2021 PCP: Oneita Hurt, No   Brief Narrative:  46 year old male admitted to ICU 1/15 under PCCM care after cardiac arrest at home.  PEA arrest, s/p CPR x20 minutes and epi x5.  Patient reportedly out drinking with his friends the night prior to admission.  Found by friends with agonal breathing.  Intubated on arrival, requiring vasopressors, noted to have profound renal failure, rhabdomyolysis with CK in the 14 K range.  Patient initially treated for aspiration pneumonia, placed on CRRT with nephrology.  Patient ultimately extubated 05/28/2021 remains off pressors.  Transferred to [facility] service 06/02/2021.  Hospital stay complicated by questionable small bowel ischemia and lower extremity neuropathy .  Patient was emergently taking to the OR on 24 January with abnormally thickened bowel but viable tissue.  Imaging reveals profound muscular swelling at the iliopsoas and gluteal muscles with questionable right iliopsoas hemorrhage versus hematoma. IR unable to successfully aspirate the space or perform drain placement. Neurology consulted, unlikely a neurological injury as the neuraxis is negative. Lower extremity paresthesias and weakness continue to slowly improve over the week.  Assessment & Plan:   Psoas swelling in the setting of severe rhabdo, unlikely abscess (HCC) versus hematoma, POA IR unable to drain or aspirate this area ICultures remain negative, leukocytosis previously downtrending (currently elevated x1 - follow trend) Blood cultures negative to date ID following - Continue with current antibiotics cefepime and daptomycin - likely require prolonged antibiotics through the end of the month per ID recs.  Bilateral lower extremity weakness/pain, improving MRI of the neuraxis  was unremarkable for acute process .  Likely neurological/ lumbar plexus ischemic injury due to previous swelling/compression  Transition off  Dilaudid to gabapentin for neuropathic pain control Continues to improve and build strength and sensation over the past 24 hours, follow-up PT recommendations for placement  Cardiac arrest with pulseless electrical activity (HCC), resolved S/p PEA arrest, s/p CPR x 20 minutes and Epi x 5. Remains stable   AKI versus overt renal failure in the setting of profound metabolic acidosis and rhabdomyolysis, resolving Nephrology signed off given marked improvemnt, appreciate insight and recommendations  Last session iHD 06/13/2021 Reassuringly urine output continues to improve, labs normalizing   Hyponatremia, hypervolemic, improving with dialysis Resolved post HD - WNL   Thrombocytosis Reactive given above   Acute posthemorrhagic anemia s/p 2 unit prbc (1/31,2/5) -continue to follow clinically S/p iv iron 2/6 On Aranesp   Elevated liver enzymes Probably from ETOH abuse, shock liver and rhabdomyolysis.  Much improved   Protein-calorie malnutrition, severe Supplement was added   Aspiration pneumonia of both lower lobes due to vomit (HCC)- (present on admission) Completed course of abx   Acute metabolic encephalopathy- (present on admission) Resolved, at baseline   Septic shock due to undetermined organism [facility])- (present on admission) Vital stable   Acute respiratory failure with hypoxia and hypercapnia (HCC)- (present on admission) Patient intubated on 05/24/2021 and extubated on 05/28/2021 .  patient completed course of antibiotics for aspiration pneumonia  On RA   DVT prophylaxis: heparin Code Status:full Family Communication: none  Status is: Inpatient  Dispo: The patient is from: Home              Anticipated d/c is to: To be determined -likely going to be a difficult discharge due to his immigration status and lack of insurance              Anticipated d/c date is:  72+ hours              Patient currently not medically stable for discharge  Consultants:  Nephrology,  PCCM, neurology, neurosurgery, orthopedic surgery  Procedures:  Exploratory laparoscopy as above  Antimicrobials:  Cefepime, daptomycin ongoing per ID  Subjective: No acute issues or events overnight, leg pain pain improving on gabapentin today given neuropathic pain  Objective: Vitals:   06/20/21 2046 06/21/21 0000 06/21/21 0430 06/21/21 0619  BP: (!) 127/92 122/78 119/84   Pulse: (!) 125 (!) 110 (!) 125   Resp: 17 18 18    Temp: 97.6 F (36.4 C) 97.8 F (36.6 C) 99.1 F (37.3 C)   TempSrc: Oral Oral Oral   SpO2: 99% 98% 98%   Weight:    71.5 kg  Height:        Intake/Output Summary (Last 24 hours) at 06/21/2021 0710 Last data filed at 06/21/2021 0616 Gross per 24 hour  Intake 1108 ml  Output 3475 ml  Net -2367 ml    Filed Weights   06/14/21 0536 06/18/21 0500 06/21/21 0619  Weight: 83.2 kg 84 kg 71.5 kg    Examination:  General:  Pleasantly resting in bed, No acute distress. HEENT:  Normocephalic atraumatic.  Sclerae nonicteric, noninjected.  Extraocular movements intact bilaterally. Neck:  Without mass or deformity. Trachea is midline. Lungs:  Clear to auscultate bilaterally without rhonchi, wheeze, or rales. Heart:  Regular rate and rhythm.  Without murmurs, rubs, or gallops. Abdomen:  Soft, nontender, nondistended.  Without guarding or rebound. Extremities: Without cyanosis, clubbing, edema, or obvious deformity.  Diminished sensation distally over the lower extremities and plantar surfaces, left foot difficulty dorsiflexion Vascular:  Dorsalis pedis and posterior tibial pulses palpable bilaterally. Skin:  Warm and dry, no erythema, no ulcerations.  Data Reviewed: I have personally reviewed following labs and imaging studies  CBC: Recent Labs  Lab 06/14/21 1125 06/15/21 0842 06/18/21 0500  WBC 10.0 9.4 15.8*  HGB 6.7* 8.2* 8.3*  HCT 22.2* 26.7* 26.5*  MCV 88.4 85.3 87.2  PLT 637* 650* 797*    Basic Metabolic Panel: Recent Labs  Lab 06/17/21 0415  06/18/21 0500 06/19/21 0408 06/20/21 0145 06/21/21 0259  NA 138 135 136 137 136  K 3.4* 4.1 3.9 4.1 4.1  CL 104 103 104 107 104  CO2 23 22 21* 20* 20*  GLUCOSE 98 112* 105* 102* 116*  BUN 51* 47* 41* 37* 38*  CREATININE 4.33* 3.43* 2.70* 2.41* 2.23*  CALCIUM 8.6* 8.6* 8.7* 9.1 9.2  PHOS 3.9 3.4 3.4 3.3 3.8    GFR: Estimated Creatinine Clearance: 42.3 mL/min (A) (by C-G formula based on SCr of 2.23 mg/dL (H)). Liver Function Tests: Recent Labs  Lab 06/17/21 0415 06/18/21 0500 06/19/21 0408 06/20/21 0145 06/21/21 0259  ALBUMIN 2.0* 2.1* 2.2* 2.1* 2.2*    No results for input(s): LIPASE, AMYLASE in the last 168 hours. No results for input(s): AMMONIA in the last 168 hours. Coagulation Profile: No results for input(s): INR, PROTIME in the last 168 hours. Cardiac Enzymes: Recent Labs  Lab 06/16/21 0344 06/18/21 0500 06/21/21 0259  CKTOTAL 88 103 95    BNP (last 3 results) No results for input(s): PROBNP in the last 8760 hours. HbA1C: No results for input(s): HGBA1C in the last 72 hours. CBG: No results for input(s): GLUCAP in the last 168 hours. Lipid Profile: No results for input(s): CHOL, HDL, LDLCALC, TRIG, CHOLHDL, LDLDIRECT in the last 72 hours. Thyroid Function Tests: No results for input(s): TSH,  T4TOTAL, FREET4, T3FREE, THYROIDAB in the last 72 hours. Anemia Panel: No results for input(s): VITAMINB12, FOLATE, FERRITIN, TIBC, IRON, RETICCTPCT in the last 72 hours. Sepsis Labs: No results for input(s): PROCALCITON, LATICACIDVEN in the last 168 hours.  Recent Results (from the past 240 hour(s))  Resp Panel by RT-PCR (Flu A&B, Covid) Nasopharyngeal Swab     Status: None   Collection Time: 06/14/21 10:16 AM   Specimen: Nasopharyngeal Swab; Nasopharyngeal(NP) swabs in vial transport medium  Result Value Ref Range Status   SARS Coronavirus 2 by RT PCR NEGATIVE NEGATIVE Final    Comment: (NOTE) SARS-CoV-2 target nucleic acids are NOT DETECTED.  The  SARS-CoV-2 RNA is generally detectable in upper respiratory specimens during the acute phase of infection. The lowest concentration of SARS-CoV-2 viral copies this assay can detect is 138 copies/mL. A negative result does not preclude SARS-Cov-2 infection and should not be used as the sole basis for treatment or other patient management decisions. A negative result may occur with  improper specimen collection/handling, submission of specimen other than nasopharyngeal swab, presence of viral mutation(s) within the areas targeted by this assay, and inadequate number of viral copies(<138 copies/mL). A negative result must be combined with clinical observations, patient history, and epidemiological information. The expected result is Negative.  Fact Sheet for Patients:  BloggerCourse.com  Fact Sheet for Healthcare Providers:  SeriousBroker.it  This test is no t yet approved or cleared by the [place] FDA and  has been authorized for detection and/or diagnosis of SARS-CoV-2 by FDA under an Emergency Use Authorization (EUA). This EUA will remain  in effect (meaning this test can be used) for the duration of the COVID-19 declaration under Section 564(b)(1) of the Act, 21 U.S.C.section 360bbb-3(b)(1), unless the authorization is terminated  or revoked sooner.       Influenza A by PCR NEGATIVE NEGATIVE Final   Influenza B by PCR NEGATIVE NEGATIVE Final    Comment: (NOTE) The Xpert Xpress SARS-CoV-2/FLU/RSV plus assay is intended as an aid in the diagnosis of influenza from Nasopharyngeal swab specimens and should not be used as a sole basis for treatment. Nasal washings and aspirates are unacceptable for Xpert Xpress SARS-CoV-2/FLU/RSV testing.  Fact Sheet for Patients: BloggerCourse.com  Fact Sheet for Healthcare Providers: SeriousBroker.it  This test is not yet approved or  cleared by the [place] FDA and has been authorized for detection and/or diagnosis of SARS-CoV-2 by FDA under an Emergency Use Authorization (EUA). This EUA will remain in effect (meaning this test can be used) for the duration of the COVID-19 declaration under Section 564(b)(1) of the Act, 21 U.S.C. section 360bbb-3(b)(1), unless the authorization is terminated or revoked.  Performed at [facility], [address], [place], [place] [address]           Radiology Studies: No results found.      Scheduled Meds:  (feeding supplement) PROSource Plus  30 mL Oral BID BM   amLODipine  5 mg Oral Daily   calcium acetate  1,334 mg Oral TID WC   feeding supplement (NEPRO CARB STEADY)  237 mL Oral QHS   folic acid  1 mg Per Tube Daily   gabapentin  200 mg Oral TID   Gerhardt's butt cream   Topical TID   heparin injection (subcutaneous)  5,000 Units Subcutaneous Q8H   multivitamin  1 tablet Oral QHS   pantoprazole  40 mg Oral BID   Continuous Infusions:  sodium chloride Stopped (05/29/21 1602)  ceFEPime (MAXIPIME) IV Stopped (06/20/21 2140)   DAPTOmycin (CUBICIN)  IV Stopped (06/20/21 2045)     LOS: 28 days   Time spent: 40min  Azucena FallenWilliam C Torry Istre, DO [facility]  If 7PM-7AM, please contact night-coverage [email or web address]  06/21/2021, 7:10 AM

## 2021-06-22 LAB — RENAL FUNCTION PANEL
Albumin: 2.3 g/dL — ABNORMAL LOW (ref 3.5–5.0)
Anion gap: 11 (ref 5–15)
BUN: 39 mg/dL — ABNORMAL HIGH (ref 6–20)
CO2: 20 mmol/L — ABNORMAL LOW (ref 22–32)
Calcium: 9.4 mg/dL (ref 8.9–10.3)
Chloride: 102 mmol/L (ref 98–111)
Creatinine, Ser: 2 mg/dL — ABNORMAL HIGH (ref 0.61–1.24)
GFR, Estimated: 41 mL/min — ABNORMAL LOW (ref >60)
Glucose, Bld: 99 mg/dL (ref 70–99)
Phosphorus: 3.7 mg/dL (ref 2.5–4.6)
Potassium: 4.4 mmol/L (ref 3.5–5.1)
Sodium: 133 mmol/L — ABNORMAL LOW (ref 135–145)

## 2021-06-22 MED ORDER — COLLAGENASE 250 UNIT/GM EX OINT
TOPICAL_OINTMENT | Freq: Every day | CUTANEOUS | Status: DC
  Filled 2021-06-22 (×2): qty 30

## 2021-06-22 NOTE — Progress Notes (Signed)
Occupational Therapy Treatment Patient Details Name: Andres Braun MRN: [id number] DOB: 08/06/1975 Today's Date: 06/22/2021   History of present illness 46 y.o. male admitted 05/24/21 after cardiac arrest at home; pt with agonal breathing, lost pulse, required 20-min CPR and 5 rounds epi. Workup for acute hypoxic respiratory failure, septic shock due to undertermined organism, AKI, acute metabolic encephalopathy, aspiration PNA of bilateral lower lobes due to vomit. ETT 1/15-1/19. CRRT 1/15-1/20. Brain MRI 1/22 negative. Cervical MRI 1/22 with small R-side disc protrusion C5-6. Abdominal CT showed muscular swelling, particularly iliopsoas and gluteal muscles consistent with rhabdomyolysis; hemorrhage noted within R iliopsoas. S/p diagnostic laparoscopy 1/24. MRI L femur 1/25 showed diffuse muscular abnormalities bilateral thighs with heterogenous T2 signal, especially within L glut max and gradratus femoris; findings compatible with dx of rhabdomyolysis, no drainable abscess identified; these findings could contribute to compartment syndrome. Unknown PMH.   OT comments  This 46 yo male was making some progress today with using a sock aid to donn socks and transfer board to get into wheelchair for the first time. He did c/o of mild dizziness when he first got into wheelchair (he had been sitting EOB ~5 minutes working on socks before this), but this subsided and then he wheeled himself down the hallway and sat outside in open platform at the end of hallway (20M) for 10 minutes and pushed himself back to room. When we stood with sara stedy he said he felt dizzy and by the time we turned him to sit in recliner he went forward and started jerking--sat him down in recliner, raised legs, reclined head, placed cold washcloth on his forehead and called RN. BP was 121/86.   Recommendations for follow up therapy are one component of a multi-disciplinary discharge planning process, led by the attending physician.   Recommendations may be updated based on patient status, additional functional criteria and insurance authorization.    Follow Up Recommendations  Skilled nursing-short term rehab (<3 hours/day)    Assistance Recommended at Discharge Frequent or constant Supervision/Assistance  Patient can return home with the following  A lot of help with walking and/or transfers;A lot of help with bathing/dressing/bathroom;Assistance with cooking/housework;Assist for transportation;Direct supervision/assist for financial management;Direct supervision/assist for medications management;Help with stairs or ramp for entrance   Equipment Recommendations  BSC/3in1;Wheelchair (measurements OT);Wheelchair cushion (measurements OT)       Precautions / Restrictions Precautions Precautions: Fall Precaution Comments: syncopal episode with sit<>stand in stedy Restrictions Weight Bearing Restrictions: No       Mobility Bed Mobility Overal bed mobility: Needs Assistance Bed Mobility: Supine to Sit     Supine to sit: Min assist          Transfers Overall transfer level: Needs assistance Equipment used: Ambulation equipment used, Sliding board Transfers: Sit to/from Stand Sit to Stand: Min assist (sara stedy from W/C)          Lateral/Scoot Transfers: Min assist (in addition to total A to place board) General transfer comment: total A to place transfer board, pt was able to lean left and right to A     Balance Overall balance assessment: Needs assistance Sitting-balance support: No upper extremity supported, Feet supported Sitting balance-Leahy Scale: Fair     Standing balance support: Bilateral upper extremity supported, During functional activity Standing balance-Leahy Scale: Poor Standing balance comment: When he stands with sara stedy he starts to come up and then hunches over propping on hand rest of sara stedy then comes up into standing.  ADL either  performed or assessed with clinical judgement   ADL Overall ADL's : Needs assistance/impaired                     Lower Body Dressing: Moderate assistance Lower Body Dressing Details (indicate cue type and reason): sitting EOB using sock aid to donn Bil socks (able to do RLE, but needs A with LLE)   Toilet Transfer Details (indicate cue type and reason): min A lateral transfer from bed to W/C with transfer board                Extremity/Trunk Assessment Upper Extremity Assessment Upper Extremity Assessment: Overall WFL for tasks assessed            Vision Ability to See in Adequate Light: 0 Adequate Patient Visual Report: No change from baseline            Cognition Arousal/Alertness: Awake/alert Behavior During Therapy: WFL for tasks assessed/performed Overall Cognitive Status: Impaired/Different from baseline Area of Impairment: Awareness, Problem solving                           Awareness: Intellectual Problem Solving: Requires verbal cues, Requires tactile cues, Decreased initiation General Comments: From eariler PT note, but without schooling and cannot read. Patient continues to not seem to understand how important it is to be up and about to get stronger and as of today to be up and about so he is not orthostatic when he does get up. (had in person interpreter)                   Pertinent Vitals/ Pain       Pain Assessment Pain Assessment: Faces Faces Pain Scale: Hurts even more Pain Location: L LE with touching it and moving it (assisted) Pain Descriptors / Indicators: Grimacing, Guarding, Numbness Pain Intervention(s): Limited activity within patient's tolerance, Monitored during session, Repositioned, Patient requesting pain meds-RN notified, RN gave pain meds during session         Frequency  Min 2X/week        Progress Toward Goals  OT Goals(current goals can now be found in the care plan section)  Progress towards  OT goals: Progressing toward goals (but had a syncopal episode at end of session today)  Acute Rehab OT Goals Patient Stated Goal: for legs not to hurt. OT Goal Formulation: With patient Time For Goal Achievement: 06/26/21 Potential to Achieve Goals: Good  Plan Discharge plan needs to be updated    Co-evaluation    PT/OT/SLP Co-Evaluation/Treatment: Yes Reason for Co-Treatment: For patient/therapist safety;To address functional/ADL transfers;Necessary to address cognition/behavior during functional activity PT goals addressed during session: Mobility/safety with mobility;Balance;Strengthening/ROM OT goals addressed during session: Strengthening/ROM;ADL's and self-care;Proper use of Adaptive equipment and DME      AM-PAC OT "6 Clicks" Daily Activity     Outcome Measure   Help from another person eating meals?: None Help from another person taking care of personal grooming?: A Little Help from another person toileting, which includes using toliet, bedpan, or urinal?: A Lot Help from another person bathing (including washing, rinsing, drying)?: A Lot Help from another person to put on and taking off regular upper body clothing?: A Little Help from another person to put on and taking off regular lower body clothing?: A Lot 6 Click Score: 16    End of Session Equipment Utilized During Treatment: Gait belt  OT Visit Diagnosis: Unsteadiness  on feet (R26.81);Other abnormalities of gait and mobility (R26.89);Muscle weakness (generalized) (M62.81);Other symptoms and signs involving cognitive function;Pain Pain - Right/Left: Left Pain - part of body:  (leg)   Activity Tolerance  (limited by syncopal episode at end of sessoin)   Patient Left in chair;with call bell/phone within reach   Nurse Communication Mobility status (with PT coming back to get him back to bed)        Time: 1400-1502 OT Time Calculation (min): 62 min  Charges: OT General Charges $OT Visit: 1 Visit OT  Treatments $Self Care/Home Management : 23-37 mins  Ignacia Palma, OTR/L Acute [employer] Pager [phone] Office [phone]    Evette Georges 06/22/2021, 5:30 PM

## 2021-06-22 NOTE — Progress Notes (Signed)
Orthostatic BPs Supine (fully reclined w/ legs elevated in chair) 110/80  Sitting upright in recliner 102/87  Sitting EOB post-standing transfer 77/65   Pt c/o significant dizziness sitting and standing up; RN aware.  Ina Homes, PT, DPT Acute Rehabilitation Services  Pager [phone] Office [phone]

## 2021-06-22 NOTE — Progress Notes (Signed)
PROGRESS NOTE    Andres Braun  [id number] DOB: 1975/06/11 DOA: 05/24/2021 PCP: Merryl Hacker, No   Brief Narrative:  46 year old male admitted to ICU 1/15 under PCCM care after cardiac arrest at home.  PEA arrest, s/p CPR x20 minutes and epi x5.  Patient reportedly out drinking with his friends the night prior to admission.  Found by friends with agonal breathing.  Intubated on arrival, requiring vasopressors, noted to have profound renal failure, rhabdomyolysis with CK in the 14 K range.  Patient initially treated for aspiration pneumonia, placed on CRRT with nephrology.  Patient ultimately extubated 05/28/2021 remains off pressors.  Transferred to [facility] service 06/02/2021.  Hospital stay complicated by questionable small bowel ischemia and lower extremity neuropathy.  Patient was emergently taking to the OR on 06/02/21 with abnormally thickened bowel but found to have viable tissue.  Imaging reveals profound muscular swelling at the iliopsoas and gluteal muscles with questionable right iliopsoas hemorrhage versus hematoma. IR unable to successfully aspirate the space or perform drain placement.  ID consulted for possible infection, continues on cefepime and daptomycin for coverage.  Neurology consulted and ultimately signed off given negative imaging for spinal cord impingement or compression. Lower extremity paresthesias and weakness continue to slowly improve over the week.  Left lower extremity remains flaccid but patient's "pins-and-needles" pain continues to involve, right lower extremity paresthesias resolved, weakness improving.  Patient remains difficult disposition given immigration status unable to qualify for SNF or inpatient rehab.  Continue PT in house, once able to ambulate safely will likely discharge home with PT regimen.  Assessment & Plan:   Psoas swelling in the setting of severe rhabdo, unlikely abscess (HCC) versus hematoma, POA IR unable to drain or aspirate this area Remains  afebrile without leukocytosis Blood cultures negative to date ID following - Continue with current antibiotics cefepime and daptomycin - likely require prolonged antibiotics through the end of the month per ID recs.  Bilateral lower extremity weakness/pain, improving MRI unremarkable for any impingement or stenosis Likely neurological/ lumbar plexus ischemic injury due to previous swelling/compression/resolving compartment syndrome Transition off Dilaudid to gabapentin for neuropathic pain control Continues to improve and build strength and sensation over the past 24 hours, follow-up PT recommendations -cannot qualify for placement  Cardiac arrest with pulseless electrical activity ([place]), resolved S/p PEA arrest, s/p CPR x 20 minutes and Epi x 5. Remains stable   AKI versus overt renal failure in the setting of profound metabolic acidosis and rhabdomyolysis, resolving Nephrology signed off given marked improvement Last session iHD 06/13/2021 Reassuringly urine output continues to improve, labs normalizing   Hyponatremia, hypervolemic, improving with dialysis Resolved post HD - WNL   Thrombocytosis Reactive given above   Acute posthemorrhagic anemia s/p 2 unit prbc (1/31,2/5) -continue to follow clinically S/p iv iron 2/6   Elevated liver enzymes Probably from ETOH abuse, shock liver and rhabdomyolysis.  Resolved   Protein-calorie malnutrition, severe Supplement was added   Aspiration pneumonia of both lower lobes due to vomit ([place])- (present on admission) Completed course of abx, no longer hypoxic   Acute metabolic encephalopathy- (present on admission) Resolved, at baseline   Septic shock due to undetermined organism [facility])- (present on admission) Vital stable   Acute respiratory failure with hypoxia and hypercapnia ([place])- (present on admission) Patient intubated on 05/24/2021 and extubated on 05/28/2021 .  patient completed course of antibiotics for aspiration pneumonia   On RA   DVT prophylaxis: heparin Code Status:full Family Communication: none  Status is: Inpatient  Dispo:  The patient is from: Home              Anticipated d/c is to: To be determined -likely going to be a difficult discharge due to his immigration status and lack of insurance              Anticipated d/c date is: 72+ hours              Patient currently not medically stable for discharge  Consultants:  Nephrology, PCCM, neurology, neurosurgery, orthopedic surgery  Procedures:  Exploratory laparoscopy as above  Antimicrobials:  Cefepime, daptomycin ongoing per ID  Subjective: No acute issues or events overnight, leg pain pain resolved at this point, left leg weakness/paraesthesias ongoing  Objective: Vitals:   06/21/21 2134 06/22/21 0158 06/22/21 0500 06/22/21 0603  BP: 116/84 124/81  108/88  Pulse: (!) 123 (!) 120  (!) 115  Resp: 14 18  15   Temp: 98.9 F (37.2 C) 98.8 F (37.1 C)  99.5 F (37.5 C)  TempSrc: Oral Oral  Oral  SpO2: 98% 99%  100%  Weight:   70.6 kg   Height:        Intake/Output Summary (Last 24 hours) at 06/22/2021 0726 Last data filed at 06/22/2021 0606 Gross per 24 hour  Intake 580 ml  Output 3350 ml  Net -2770 ml    Filed Weights   06/18/21 0500 06/21/21 0619 06/22/21 0500  Weight: 84 kg 71.5 kg 70.6 kg    Examination:  General:  Pleasantly resting in bed, No acute distress. HEENT:  Normocephalic atraumatic.  Sclerae nonicteric, noninjected.  Extraocular movements intact bilaterally. Neck:  Without mass or deformity.  Trachea is midline. Lungs:  Clear to auscultate bilaterally without rhonchi, wheeze, or rales. Heart:  Regular rate and rhythm.  Without murmurs, rubs, or gallops. Abdomen:  Soft, nontender, nondistended.  Without guarding or rebound. Extremities: Without cyanosis, clubbing, edema; right lower extremity strength 4 out of 5 without paresthesias, left lower extremity flaccid, painful paresthesias and nerve pain diffusely  from the pelvis distally Vascular:  Dorsalis pedis and posterior tibial pulses palpable bilaterally. Skin: Right lower extremity anterior wound from previous IO placement   Data Reviewed: I have personally reviewed following labs and imaging studies  CBC: Recent Labs  Lab 06/15/21 0842 06/18/21 0500  WBC 9.4 15.8*  HGB 8.2* 8.3*  HCT 26.7* 26.5*  MCV 85.3 87.2  PLT 650* 797*    Basic Metabolic Panel: Recent Labs  Lab 06/18/21 0500 06/19/21 0408 06/20/21 0145 06/21/21 0259 06/22/21 0307  NA 135 136 137 136 133*  K 4.1 3.9 4.1 4.1 4.4  CL 103 104 107 104 102  CO2 22 21* 20* 20* 20*  GLUCOSE 112* 105* 102* 116* 99  BUN 47* 41* 37* 38* 39*  CREATININE 3.43* 2.70* 2.41* 2.23* 2.00*  CALCIUM 8.6* 8.7* 9.1 9.2 9.4  PHOS 3.4 3.4 3.3 3.8 3.7    GFR: Estimated Creatinine Clearance: 46.6 mL/min (A) (by C-G formula based on SCr of 2 mg/dL (H)). Liver Function Tests: Recent Labs  Lab 06/18/21 0500 06/19/21 0408 06/20/21 0145 06/21/21 0259 06/22/21 0307  ALBUMIN 2.1* 2.2* 2.1* 2.2* 2.3*    No results for input(s): LIPASE, AMYLASE in the last 168 hours. No results for input(s): AMMONIA in the last 168 hours. Coagulation Profile: No results for input(s): INR, PROTIME in the last 168 hours. Cardiac Enzymes: Recent Labs  Lab 06/16/21 0344 06/18/21 0500 06/21/21 0259  CKTOTAL 88 103 95    BNP (last  3 results) No results for input(s): PROBNP in the last 8760 hours. HbA1C: No results for input(s): HGBA1C in the last 72 hours. CBG: No results for input(s): GLUCAP in the last 168 hours. Lipid Profile: No results for input(s): CHOL, HDL, LDLCALC, TRIG, CHOLHDL, LDLDIRECT in the last 72 hours. Thyroid Function Tests: No results for input(s): TSH, T4TOTAL, FREET4, T3FREE, THYROIDAB in the last 72 hours. Anemia Panel: No results for input(s): VITAMINB12, FOLATE, FERRITIN, TIBC, IRON, RETICCTPCT in the last 72 hours. Sepsis Labs: No results for input(s): PROCALCITON,  LATICACIDVEN in the last 168 hours.  Recent Results (from the past 240 hour(s))  Resp Panel by RT-PCR (Flu A&B, Covid) Nasopharyngeal Swab     Status: None   Collection Time: 06/14/21 10:16 AM   Specimen: Nasopharyngeal Swab; Nasopharyngeal(NP) swabs in vial transport medium  Result Value Ref Range Status   SARS Coronavirus 2 by RT PCR NEGATIVE NEGATIVE Final    Comment: (NOTE) SARS-CoV-2 target nucleic acids are NOT DETECTED.  The SARS-CoV-2 RNA is generally detectable in upper respiratory specimens during the acute phase of infection. The lowest concentration of SARS-CoV-2 viral copies this assay can detect is 138 copies/mL. A negative result does not preclude SARS-Cov-2 infection and should not be used as the sole basis for treatment or other patient management decisions. A negative result may occur with  improper specimen collection/handling, submission of specimen other than nasopharyngeal swab, presence of viral mutation(s) within the areas targeted by this assay, and inadequate number of viral copies(<138 copies/mL). A negative result must be combined with clinical observations, patient history, and epidemiological information. The expected result is Negative.  Fact Sheet for Patients:  EntrepreneurPulse.com.au  Fact Sheet for Healthcare Providers:  IncredibleEmployment.be  This test is no t yet approved or cleared by the [place] FDA and  has been authorized for detection and/or diagnosis of SARS-CoV-2 by FDA under an Emergency Use Authorization (EUA). This EUA will remain  in effect (meaning this test can be used) for the duration of the COVID-19 declaration under Section 564(b)(1) of the Act, 21 U.S.C.section 360bbb-3(b)(1), unless the authorization is terminated  or revoked sooner.       Influenza A by PCR NEGATIVE NEGATIVE Final   Influenza B by PCR NEGATIVE NEGATIVE Final    Comment: (NOTE) The Xpert Xpress  SARS-CoV-2/FLU/RSV plus assay is intended as an aid in the diagnosis of influenza from Nasopharyngeal swab specimens and should not be used as a sole basis for treatment. Nasal washings and aspirates are unacceptable for Xpert Xpress SARS-CoV-2/FLU/RSV testing.  Fact Sheet for Patients: EntrepreneurPulse.com.au  Fact Sheet for Healthcare Providers: IncredibleEmployment.be  This test is not yet approved or cleared by the [place] FDA and has been authorized for detection and/or diagnosis of SARS-CoV-2 by FDA under an Emergency Use Authorization (EUA). This EUA will remain in effect (meaning this test can be used) for the duration of the COVID-19 declaration under Section 564(b)(1) of the Act, 21 U.S.C. section 360bbb-3(b)(1), unless the authorization is terminated or revoked.  Performed at [facility], [place] [address], [place], [place] [address]           Radiology Studies: No results found.      Scheduled Meds:  (feeding supplement) PROSource Plus  30 mL Oral BID BM   amLODipine  5 mg Oral Daily   calcium acetate  1,334 mg Oral TID WC   feeding supplement (NEPRO CARB STEADY)  237 mL Oral QHS   folic acid  1  mg Per Tube Daily   gabapentin  200 mg Oral TID   Gerhardt's butt cream   Topical TID   heparin injection (subcutaneous)  5,000 Units Subcutaneous Q8H   multivitamin  1 tablet Oral QHS   pantoprazole  40 mg Oral BID   Continuous Infusions:  sodium chloride Stopped (05/29/21 1602)   ceFEPime (MAXIPIME) IV Stopped (06/21/21 2210)   DAPTOmycin (CUBICIN)  IV Stopped (06/21/21 2130)     LOS: 29 days   Time spent: 5min  Deborh Pense C Kory Rains, DO [facility]  If 7PM-7AM, please contact night-coverage [email or web address]  06/22/2021, 7:26 AM

## 2021-06-22 NOTE — Progress Notes (Signed)
Physical Therapy Treatment Patient Details Name: Andres Braun MRN: [id number] DOB: Aug 15, 1975 Today's Date: 06/22/2021   History of Present Illness Pt is a 46 y.o. male admitted 05/24/21 after cardiac arrest at home; pt with agonal breathing, lost pulse, required 20-min CPR and 5 rounds epi. Workup for acute hypoxic respiratory failure, septic shock due to undertermined organism, AKI, acute metabolic encephalopathy, aspiration PNA of bilateral lower lobes due to vomit. ETT 1/15-1/19. CRRT 1/15-1/20. Brain MRI 1/22 negative. Cervical MRI 1/22 with small R-side disc protrusion C5-6. Abdominal CT showed muscular swelling, particularly iliopsoas and gluteal muscles consistent with rhabdomyolysis; hemorrhage noted within R iliopsoas. S/p diagnostic laparoscopy 1/24. MRI L femur 1/25 showed diffuse muscular abnormalities bilateral thighs with heterogenous T2 signal, especially within L glut max and gradratus femoris; findings compatible with dx of rhabdomyolysis, no drainable abscess identified; these findings could contribute to compartment syndrome. Unknown PMH.   PT Comments    Pt progressing with mobility. Today's session focused on initiating slide board transfers and wheelchair management; also performed standing transfer with stedy frame, during which pt had brief tremulous then unresponsive episode while standing; BP 121/86 by the time it was taken with pt fully reclined/legs elevated; pt endorses significant dizziness. Pt remains limited by generalized weakness, decreased activity tolerance, poor balance strategies/postural reactions and impaired cognition. Continue to recommend post-acute rehab to maximize functional mobility and independence.    Recommendations for follow up therapy are one component of a multi-disciplinary discharge planning process, led by the attending physician.  Recommendations may be updated based on patient status, additional functional criteria and insurance  authorization.  Follow Up Recommendations  Skilled nursing-short term rehab (<3 hours/day)     Assistance Recommended at Discharge Frequent or constant Supervision/Assistance  Patient can return home with the following A lot of help with walking and/or transfers;A lot of help with bathing/dressing/bathroom;Assistance with cooking/housework;Direct supervision/assist for medications management;Direct supervision/assist for financial management;Assist for transportation;Help with stairs or ramp for entrance   Equipment Recommendations  TBD - Wheelchair (measurements PT); Wheelchair cushion (measurements PT); Hospital bed; Drop-arm BSC/3in1    Recommendations for Other Services       Precautions / Restrictions Precautions Precautions: Fall;Other (comment) Precaution Comments: syncopal episode with sit<>stand in stedy Restrictions Weight Bearing Restrictions: No     Mobility  Bed Mobility Overal bed mobility: Needs Assistance Bed Mobility: Supine to Sit     Supine to sit: Min assist     General bed mobility comments: MinA for LLE management, heavy use of bed rail    Transfers Overall transfer level: Needs assistance Equipment used: Sliding board, Ambulation equipment used Transfers: Bed to chair/wheelchair/BSC, Sit to/from Stand Sit to Stand: Min assist          Lateral/Scoot Transfers: With slide board, Min assist, Mod assist, Max assist General transfer comment: totalA for w/c and transer set-up, including slide board placement under R hip; minA for scooting hips with bed pad, mod-maxA for BLE placement with scooting. stand from wheelchair with stedy frame, minA for trunk elevation and stability, heavy reliance on BUE support and momentum to power up; syncopal episode in stedy requiring totalA for descent to Arts administrator via Lift Equipment: [employer]  Ambulation/Gait                   Midwife mobility: Yes Wheelchair propulsion: Both upper extremities Wheelchair parts: Needs assistance Distance: 120 Wheelchair Assistance  Details (indicate cue type and reason): cues for locking/unlocking brakes, assist to don/doff leg rests and position BLEs; pt able to self-propel with intermittent assist to navigate small corner/turns (i.e. around EOB)  Modified Rankin (Stroke Patients Only)       Balance Overall balance assessment: Needs assistance Sitting-balance support: No upper extremity supported, Feet supported Sitting balance-Leahy Scale: Fair     Standing balance support: Bilateral upper extremity supported, During functional activity Standing balance-Leahy Scale: Poor Standing balance comment: When he stands with sara stedy he starts to come up and then hunches over propping on hand rest of sara stedy then comes up into standing.                            Cognition Arousal/Alertness: Awake/alert Behavior During Therapy: WFL for tasks assessed/performed Overall Cognitive Status: Impaired/Different from baseline Area of Impairment: Following commands, Awareness, Problem solving, Attention, Safety/judgement                   Current Attention Level: Sustained, Selective   Following Commands: Follows multi-step commands inconsistently Safety/Judgement: Decreased awareness of safety, Decreased awareness of deficits Awareness: Intellectual, Emergent Problem Solving: Requires verbal cues, Requires tactile cues, Decreased initiation General Comments: From eariler PT note, but without schooling and cannot read. Patient continues to not seem to understand how important it is to be up and about to get stronger and as of today to be up and about so he is not orthostatic when he does get up. (had in person interpreter). Difficult to determine true cognitive impairment vs frustration(?)/desire to participate/respond at times        Exercises       General Comments General comments (skin integrity, edema, etc.): syncopal episode(?) standing in stedy; BP stable after pt fully reclined with legs elevated, HR 130s; pt endorses dizziness; RN present and aware      Pertinent Vitals/Pain Pain Assessment Pain Assessment: Faces Faces Pain Scale: Hurts even more Pain Location: LLE > RLE with mobility Pain Descriptors / Indicators: Grimacing, Guarding, Numbness Pain Intervention(s): Limited activity within patient's tolerance, Monitored during session, Patient requesting pain meds-RN notified, RN gave pain meds during session    Home Living                          Prior Function            PT Goals (current goals can now be found in the care plan section) Progress towards PT goals: Progressing toward goals    Frequency    Min 3X/week      PT Plan Current plan remains appropriate    Co-evaluation   Reason for Co-Treatment: For patient/therapist safety;To address functional/ADL transfers;Necessary to address cognition/behavior during functional activity PT goals addressed during session: Mobility/safety with mobility;Balance;Strengthening/ROM OT goals addressed during session: Strengthening/ROM;ADL's and self-care;Proper use of Adaptive equipment and DME      AM-PAC PT "6 Clicks" Mobility   Outcome Measure  Help needed turning from your back to your side while in a flat bed without using bedrails?: A Little Help needed moving from lying on your back to sitting on the side of a flat bed without using bedrails?: A Little Help needed moving to and from a bed to a chair (including a wheelchair)?: A Lot Help needed standing up from a chair using your arms (e.g., wheelchair or bedside chair)?: Total Help needed to walk in hospital  room?: Total Help needed climbing 3-5 steps with a railing? : Total 6 Click Score: 11    End of Session Equipment Utilized During Treatment: Gait belt Activity Tolerance: Patient  tolerated treatment well;Treatment limited secondary to medical complications (Comment) Patient left: in chair;with chair alarm set;with call bell/phone within reach;with nursing/sitter in room Nurse Communication: Mobility status PT Visit Diagnosis: Muscle weakness (generalized) (M62.81);Difficulty in walking, not elsewhere classified (R26.2);Unsteadiness on feet (R26.81)     Time: [id number] PT Time Calculation (min) (ACUTE ONLY): 45 min  Charges:  $Therapeutic Activity: 8-22 mins $Wheel Chair Management: 8-22 mins                     Mabeline Caras, PT, DPT Acute Rehabilitation Services  Pager [phone] Office [place] 06/22/2021, 5:53 PM

## 2021-06-22 NOTE — Progress Notes (Signed)
Physical Therapy Treatment Patient Details Name: Andres Braun MRN: [id number] DOB: [birth year] Today's Date: 06/22/2021   History of Present Illness Pt is a 46 y.o. male admitted 05/24/21 after cardiac arrest at home; pt with agonal breathing, lost pulse, required 20-min CPR and 5 rounds epi. Workup for acute hypoxic respiratory failure, septic shock due to undertermined organism, AKI, acute metabolic encephalopathy, aspiration PNA of bilateral lower lobes due to vomit. ETT 1/15-1/19. CRRT 1/15-1/20. Brain MRI 1/22 negative. Cervical MRI 1/22 with small R-side disc protrusion C5-6. Abdominal CT showed muscular swelling, particularly iliopsoas and gluteal muscles consistent with rhabdomyolysis; hemorrhage noted within R iliopsoas. S/p diagnostic laparoscopy 1/24. MRI L femur 1/25 showed diffuse muscular abnormalities bilateral thighs with heterogenous T2 signal, especially within L glut max and gradratus femoris; findings compatible with dx of rhabdomyolysis, no drainable abscess identified; these findings could contribute to compartment syndrome. Unknown PMH.   PT Comments    Pt seen for additional session for transfer back to bed, as well as checking orthostatic vital signs (see below). Pt with c/o significant dizziness with upright sitting and standing; no syncopal episode this time though. Pt endorses significant fatigue; continued educ on importance of more frequent OOB activity. Will continue to follow acutely to address established goals.  Orthostatic BPs Supine (fully reclined w/ legs elevated in chair) 110/80  Sitting upright in recliner 102/87  Sitting EOB post-standing transfer 77/65      Recommendations for follow up therapy are one component of a multi-disciplinary discharge planning process, led by the attending physician.  Recommendations may be updated based on patient status, additional functional criteria and insurance authorization.  Follow Up Recommendations  Skilled  nursing-short term rehab (<3 hours/day)     Assistance Recommended at Discharge Frequent or constant Supervision/Assistance  Patient can return home with the following A lot of help with walking and/or transfers;Assistance with cooking/housework;Direct supervision/assist for medications management;Direct supervision/assist for financial management;Assist for transportation;Help with stairs or ramp for entrance;A lot of help with bathing/dressing/bathroom   Equipment Recommendations  Wheelchair (measurements PT);Wheelchair cushion (measurements PT);Hospital bed;BSC/3in1;Other (comment) (lift equipment?)    Recommendations for Other Services       Precautions / Restrictions Precautions Precautions: Fall;Other (comment) Precaution Comments: symptomatic orthostatic hypotension with standing Restrictions Weight Bearing Restrictions: No     Mobility  Bed Mobility Overal bed mobility: Needs Assistance Bed Mobility: Sit to Supine     Supine to sit: Min assist Sit to supine: Mod assist   General bed mobility comments: ModA for BLE management return to supine; use of bed rails    Transfers Overall transfer level: Needs assistance Equipment used: Ambulation equipment used Transfers: Sit to/from Stand, Bed to chair/wheelchair/BSC Sit to Stand: Mod assist         General transfer comment: ModA for trunk elevation into stedy frame; deferred additional standing trials or transfers due to symptomatic orthostatic hypotension Transfer via Lift Equipment: Stedy  Ambulation/Gait                   Stairs             Wheelchair Mobility  Modified Rankin (Stroke Patients Only)       Balance Overall balance assessment: Needs assistance Sitting-balance support: No upper extremity supported, Feet supported Sitting balance-Leahy Scale: Fair     Standing balance support: Bilateral upper extremity supported, During functional activity Standing balance-Leahy Scale:  Poor Standing balance comment: When he stands with sara stedy he starts to come up and then hunches  over propping on hand rest of sara stedy then comes up into standing.                            Cognition Arousal/Alertness: Awake/alert Behavior During Therapy: Flat affect Overall Cognitive Status: Impaired/Different from baseline Area of Impairment: Following commands, Awareness, Problem solving, Attention, Safety/judgement                   Current Attention Level: Sustained, Selective   Following Commands: Follows multi-step commands inconsistently Safety/Judgement: Decreased awareness of safety, Decreased awareness of deficits Awareness: Intellectual, Emergent Problem Solving: Requires verbal cues, Requires tactile cues, Decreased initiation General Comments: From eariler PT note, but without schooling and cannot read. Patient continues to not seem to understand how important it is to be up and about to get stronger and as of today to be up and about so he is not orthostatic when he does get up. (had in person interpreter). Difficult to determine true cognitive impairment vs frustration(?)/desire to participate/respond at times (session performed with ipad video interpreter)        Exercises      General Comments General comments (skin integrity, edema, etc.): BP from 110/80 reclined to 77/65 post-stand, HR 130s; RN notified. Educ pt on orthostatic hypotension, including fact that staying in bed can be a contributing factor - pt does not seem to grasp importance of this? (session performed via ipad video interpreter)      Pertinent Vitals/Pain Pain Assessment Pain Assessment: Faces Faces Pain Scale: Hurts little more Pain Location: LLE > RLE with mobility Pain Descriptors / Indicators: Grimacing, Guarding, Numbness Pain Intervention(s): Limited activity within patient's tolerance, Monitored during session    Home Living                           Prior Function            PT Goals (current goals can now be found in the care plan section) Progress towards PT goals: Progressing toward goals    Frequency    Min 3X/week      PT Plan Current plan remains appropriate    Co-evaluation   Reason for Co-Treatment: For patient/therapist safety;To address functional/ADL transfers;Necessary to address cognition/behavior during functional activity PT goals addressed during session: Mobility/safety with mobility;Balance;Strengthening/ROM OT goals addressed during session: Strengthening/ROM;ADL's and self-care;Proper use of Adaptive equipment and DME      AM-PAC PT "6 Clicks" Mobility   Outcome Measure  Help needed turning from your back to your side while in a flat bed without using bedrails?: A Little Help needed moving from lying on your back to sitting on the side of a flat bed without using bedrails?: A Little Help needed moving to and from a bed to a chair (including a wheelchair)?: A Lot Help needed standing up from a chair using your arms (e.g., wheelchair or bedside chair)?: Total Help needed to walk in hospital room?: Total Help needed climbing 3-5 steps with a railing? : Total 6 Click Score: 11    End of Session Equipment Utilized During Treatment: Gait belt Activity Tolerance: Patient limited by fatigue;Treatment limited secondary to medical complications (Comment) Patient left: in bed;with call bell/phone within reach;with bed alarm set Nurse Communication: Mobility status PT Visit Diagnosis: Muscle weakness (generalized) (M62.81);Difficulty in walking, not elsewhere classified (R26.2);Unsteadiness on feet (R26.81)     Time: 5397-6734 PT Time Calculation (min) (ACUTE ONLY): 24  min  Charges:  $Therapeutic Activity: 8-22 mins $Self Care/Home Management: 8-22                    Ina Homes, PT, DPT Acute Rehabilitation Services  Pager [phone] Office [phone]  Malachy Chamber 06/22/2021, 6:06  PM

## 2021-06-22 NOTE — Consult Note (Signed)
WOC Nurse Consult Note: Reason for Consult: questionable DTPI right knee Wound type: 05/18/21; seen by my partner for discoloration of the right knee and blistering  s/p IO needle placement in the field during resuscitation. Trauma concerning now for infection and tissues are necrotic   Pressure Injury POA: NA Measurement:14cm x 10cm discolored along perimeter; soft necrotic tissue over 100% of the wound bed. Two areas of purulent drainage  Wound bed:see above  Drainage (amount, consistency, odor) purulent, thick Periwound: intact  Dressing procedure/placement/frequency: Typically I would add enzymatic debridement, however with unclear etiology will hold off for now Recommend MRI or at least xray of the right knee, ? Orthopedic consult.  I will follow along remotely to determine if topical wound care changes are desired.    Discussed POC with patient and bedside nurse.  Re consult if needed, will not follow at this time. Thanks  Jayla Mackie [employer], RN,CWOCN, CNS, CWON-AP [phone])

## 2021-06-22 NOTE — Progress Notes (Signed)
[facility] Infect[facility]  Date of Admission:  05/24/2021     Total days of antibiotics 29         ASSESSMENT:  [name] continues to have leg weakness/paraesthesia of unclear origin although pain has improved.  There is concern about possible infection at previous interosseus line sight with recommendation for additional imaging. Anticipate any infection should be covered with current broad spectrum although may need surgical intervention pending imaging results. CK level on 06/21/21 of 95 which is normal. Continue with Daptomycin and cefepime. Plan remains in place for treatment end date of 3/3. If discharged can be changed to doxycyline and Augmentin to finish treatment. ID will follow peripherally pending imaging results. Remaining medical and supportive care per primary team.   PLAN:  Continue Daptomycin and cefepime. Monitor CK levels while on Daptomycin per protocol. Treatment end date of 07/10/21 with change to oral doxycycline and Augmentin at discharge if needed.  Additional assessment of right knee per Primary team.  Remaining medical and supportive care per primary team.  ID will follow peripherally.    Principal Problem:   Cardiac arrest with pulseless electrical activity (HCC) Active Problems:   Acute respiratory failure with hypoxia and hypercapnia (HCC)   Septic shock due to undetermined organism ([place])   AKI (acute kidney injury) ([place])   Acute metabolic encephalopathy   Aspiration pneumonia of both lower lobes due to vomit (HCC)   Protein-calorie malnutrition, severe   Ischemic enteritis versus colitis with ileus   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HCC)   Lower extremity weakness with left foot drop   right anterior knee wound   Hyponatremia   Mechanical fall 1/24 and hit his head   Acute posthemorrhagic anemia   Elevated liver enzymes   Thrombocytosis    (feeding supplement) PROSource Plus  30 mL Oral BID BM   amLODipine  5 mg Oral Daily   calcium acetate  1,334  mg Oral TID WC   collagenase   Topical Daily   feeding supplement (NEPRO CARB STEADY)  237 mL Oral QHS   folic acid  1 mg Per Tube Daily   gabapentin  200 mg Oral TID   Gerhardt's butt cream   Topical TID   heparin injection (subcutaneous)  5,000 Units Subcutaneous Q8H   multivitamin  1 tablet Oral QHS   pantoprazole  40 mg Oral BID    SUBJECTIVE:  Afebrile overnight with no acute events. No new concerns/complaints. Feeling better. Spanish interpreter is present via tablet to aid in communication.   No Known Allergies   Review of Systems: Review of Systems  Constitutional:  Negative for chills, fever and weight loss.  Respiratory:  Negative for cough, shortness of breath and wheezing.   Cardiovascular:  Negative for chest pain and leg swelling.  Gastrointestinal:  Negative for abdominal pain, constipation, diarrhea, nausea and vomiting.  Skin:  Negative for rash.     OBJECTIVE: Vitals:   06/22/21 0158 06/22/21 0500 06/22/21 0603 06/22/21 1128  BP: 124/81  108/88 119/86  Pulse: (!) 120  (!) 115 (!) 119  Resp: 18  15 16   Temp: 98.8 F (37.1 C)  99.5 F (37.5 C)   TempSrc: Oral  Oral   SpO2: 99%  100% 99%  Weight:  70.6 kg    Height:       Body mass index is 21.11 kg/m.  Physical Exam Constitutional:      General: He is not in acute distress.    Appearance: He  is well-developed.  Cardiovascular:     Rate and Rhythm: Normal rate and regular rhythm.     Heart sounds: Normal heart sounds.  Pulmonary:     Effort: Pulmonary effort is normal.     Breath sounds: Normal breath sounds.  Skin:    General: Skin is warm and dry.  Neurological:     Mental Status: He is alert.  Psychiatric:        Mood and Affect: Mood normal.    Lab Results Lab Results  Component Value Date   WBC 15.8 (H) 06/18/2021   HGB 8.3 (L) 06/18/2021   HCT 26.5 (L) 06/18/2021   MCV 87.2 06/18/2021   PLT 797 (H) 06/18/2021    Lab Results  Component Value Date   CREATININE 2.00 (H)  06/22/2021   BUN 39 (H) 06/22/2021   NA 133 (L) 06/22/2021   K 4.4 06/22/2021   CL 102 06/22/2021   CO2 20 (L) 06/22/2021    Lab Results  Component Value Date   ALT 16 06/08/2021   AST 25 06/08/2021   ALKPHOS 148 (H) 06/08/2021   BILITOT 0.2 (L) 06/08/2021     Microbiology: Recent Results (from the past 240 hour(s))  Resp Panel by RT-PCR (Flu A&B, Covid) Nasopharyngeal Swab     Status: None   Collection Time: 06/14/21 10:16 AM   Specimen: Nasopharyngeal Swab; Nasopharyngeal(NP) swabs in vial transport medium  Result Value Ref Range Status   SARS Coronavirus 2 by RT PCR NEGATIVE NEGATIVE Final    Comment: (NOTE) SARS-CoV-2 target nucleic acids are NOT DETECTED.  The SARS-CoV-2 RNA is generally detectable in upper respiratory specimens during the acute phase of infection. The lowest concentration of SARS-CoV-2 viral copies this assay can detect is 138 copies/mL. A negative result does not preclude SARS-Cov-2 infection and should not be used as the sole basis for treatment or other patient management decisions. A negative result may occur with  improper specimen collection/handling, submission of specimen other than nasopharyngeal swab, presence of viral mutation(s) within the areas targeted by this assay, and inadequate number of viral copies(<138 copies/mL). A negative result must be combined with clinical observations, patient history, and epidemiological information. The expected result is Negative.  Fact Sheet for Patients:  EntrepreneurPulse.com.au  Fact Sheet for Healthcare Providers:  IncredibleEmployment.be  This test is no t yet approved or cleared by the [place] FDA and  has been authorized for detection and/or diagnosis of SARS-CoV-2 by FDA under an Emergency Use Authorization (EUA). This EUA will remain  in effect (meaning this test can be used) for the duration of the COVID-19 declaration under Section 564(b)(1) of  the Act, 21 U.S.C.section 360bbb-3(b)(1), unless the authorization is terminated  or revoked sooner.       Influenza A by PCR NEGATIVE NEGATIVE Final   Influenza B by PCR NEGATIVE NEGATIVE Final    Comment: (NOTE) The Xpert Xpress SARS-CoV-2/FLU/RSV plus assay is intended as an aid in the diagnosis of influenza from Nasopharyngeal swab specimens and should not be used as a sole basis for treatment. Nasal washings and aspirates are unacceptable for Xpert Xpress SARS-CoV-2/FLU/RSV testing.  Fact Sheet for Patients: EntrepreneurPulse.com.au  Fact Sheet for Healthcare Providers: IncredibleEmployment.be  This test is not yet approved or cleared by the [place] FDA and has been authorized for detection and/or diagnosis of SARS-CoV-2 by FDA under an Emergency Use Authorization (EUA). This EUA will remain in effect (meaning this test can be used) for the duration of the COVID-19  declaration under Section 564(b)(1) of the Act, 21 U.S.C. section 360bbb-3(b)(1), unless the authorization is terminated or revoked.  Performed at [facility], [place] [address] [place], [place] [address]      Terri Piedra, [place] [facility]  06/22/2021  1:18 PM

## 2021-06-23 ENCOUNTER — Inpatient Hospital Stay (HOSPITAL_COMMUNITY): Payer: Self-pay

## 2021-06-23 LAB — CBC
HCT: 30.6 % — ABNORMAL LOW (ref 39.0–52.0)
Hemoglobin: 9.2 g/dL — ABNORMAL LOW (ref 13.0–17.0)
MCH: 25.8 pg — ABNORMAL LOW (ref 26.0–34.0)
MCHC: 30.1 g/dL (ref 30.0–36.0)
MCV: 86 fL (ref 80.0–100.0)
Platelets: 873 10*3/uL — ABNORMAL HIGH (ref 150–400)
RBC: 3.56 MIL/uL — ABNORMAL LOW (ref 4.22–5.81)
RDW: 15.6 % — ABNORMAL HIGH (ref 11.5–15.5)
WBC: 11 10*3/uL — ABNORMAL HIGH (ref 4.0–10.5)
nRBC: 0 % (ref 0.0–0.2)

## 2021-06-23 IMAGING — DX DG TIBIA/FIBULA 2V*R*
1 series · 4 of 4 positions shown · non-contrast
Comparison: None.

CLINICAL DATA: Osteomyelitis

EXAM:
No evidence RIGHT TIBIA AND FIBULA - 2 VIEW

[Series 1: leg · 0.14mm/px · 4 of 4 slices shown]
[im 1/4]
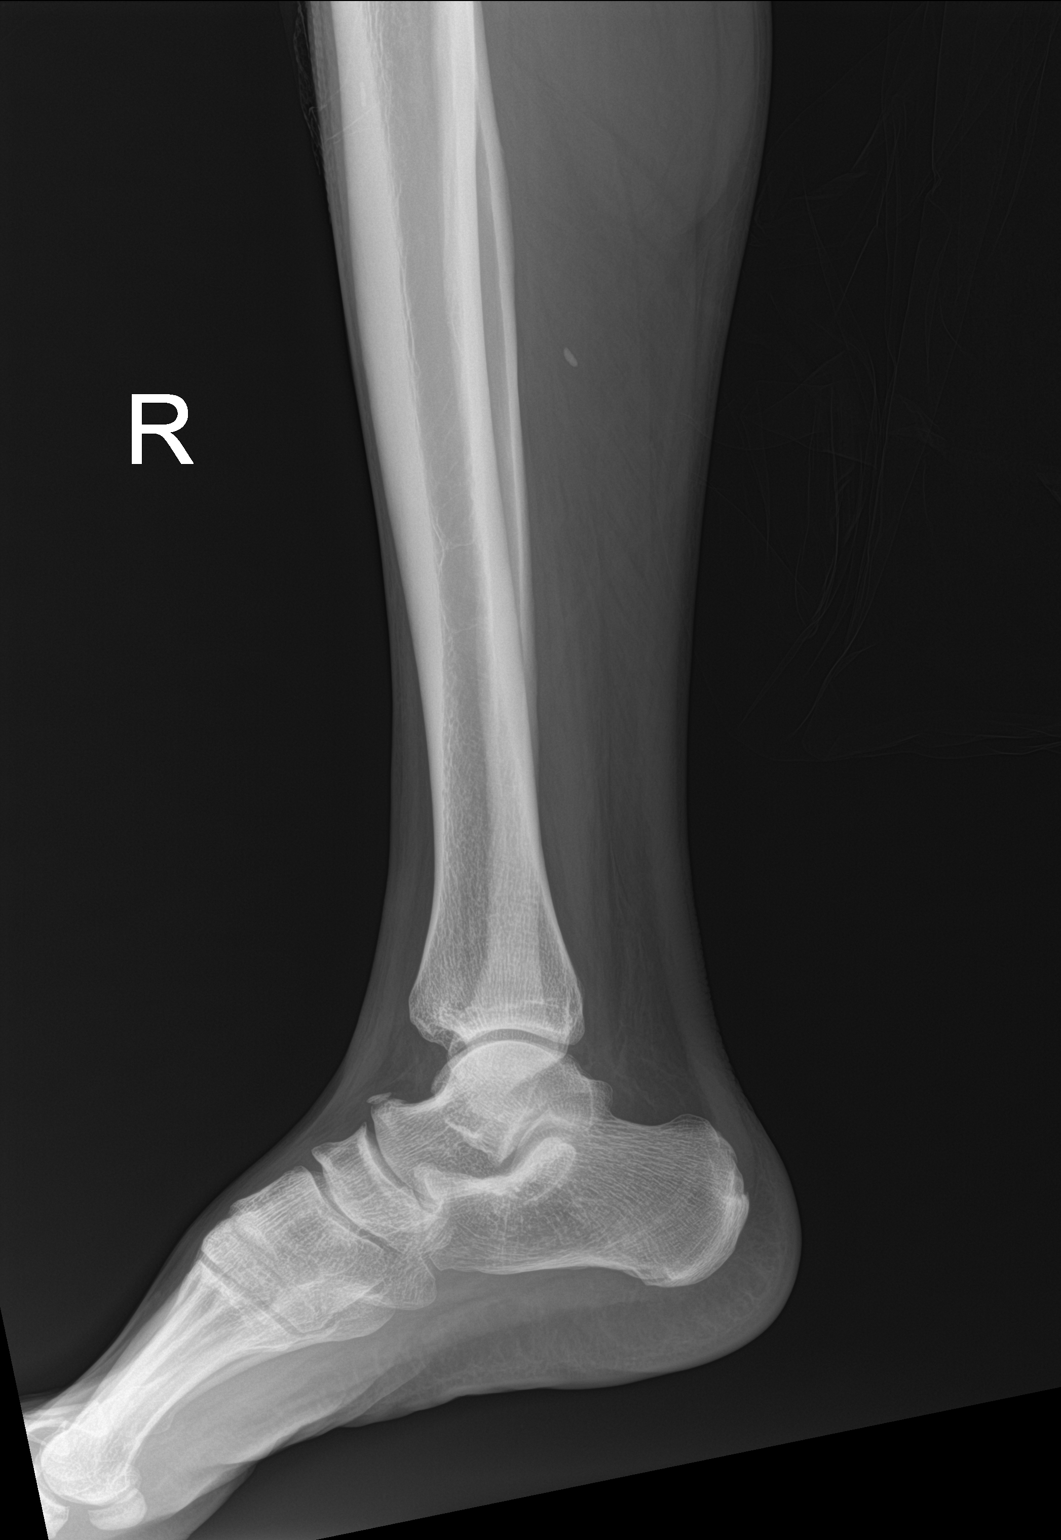
[im 2/4]
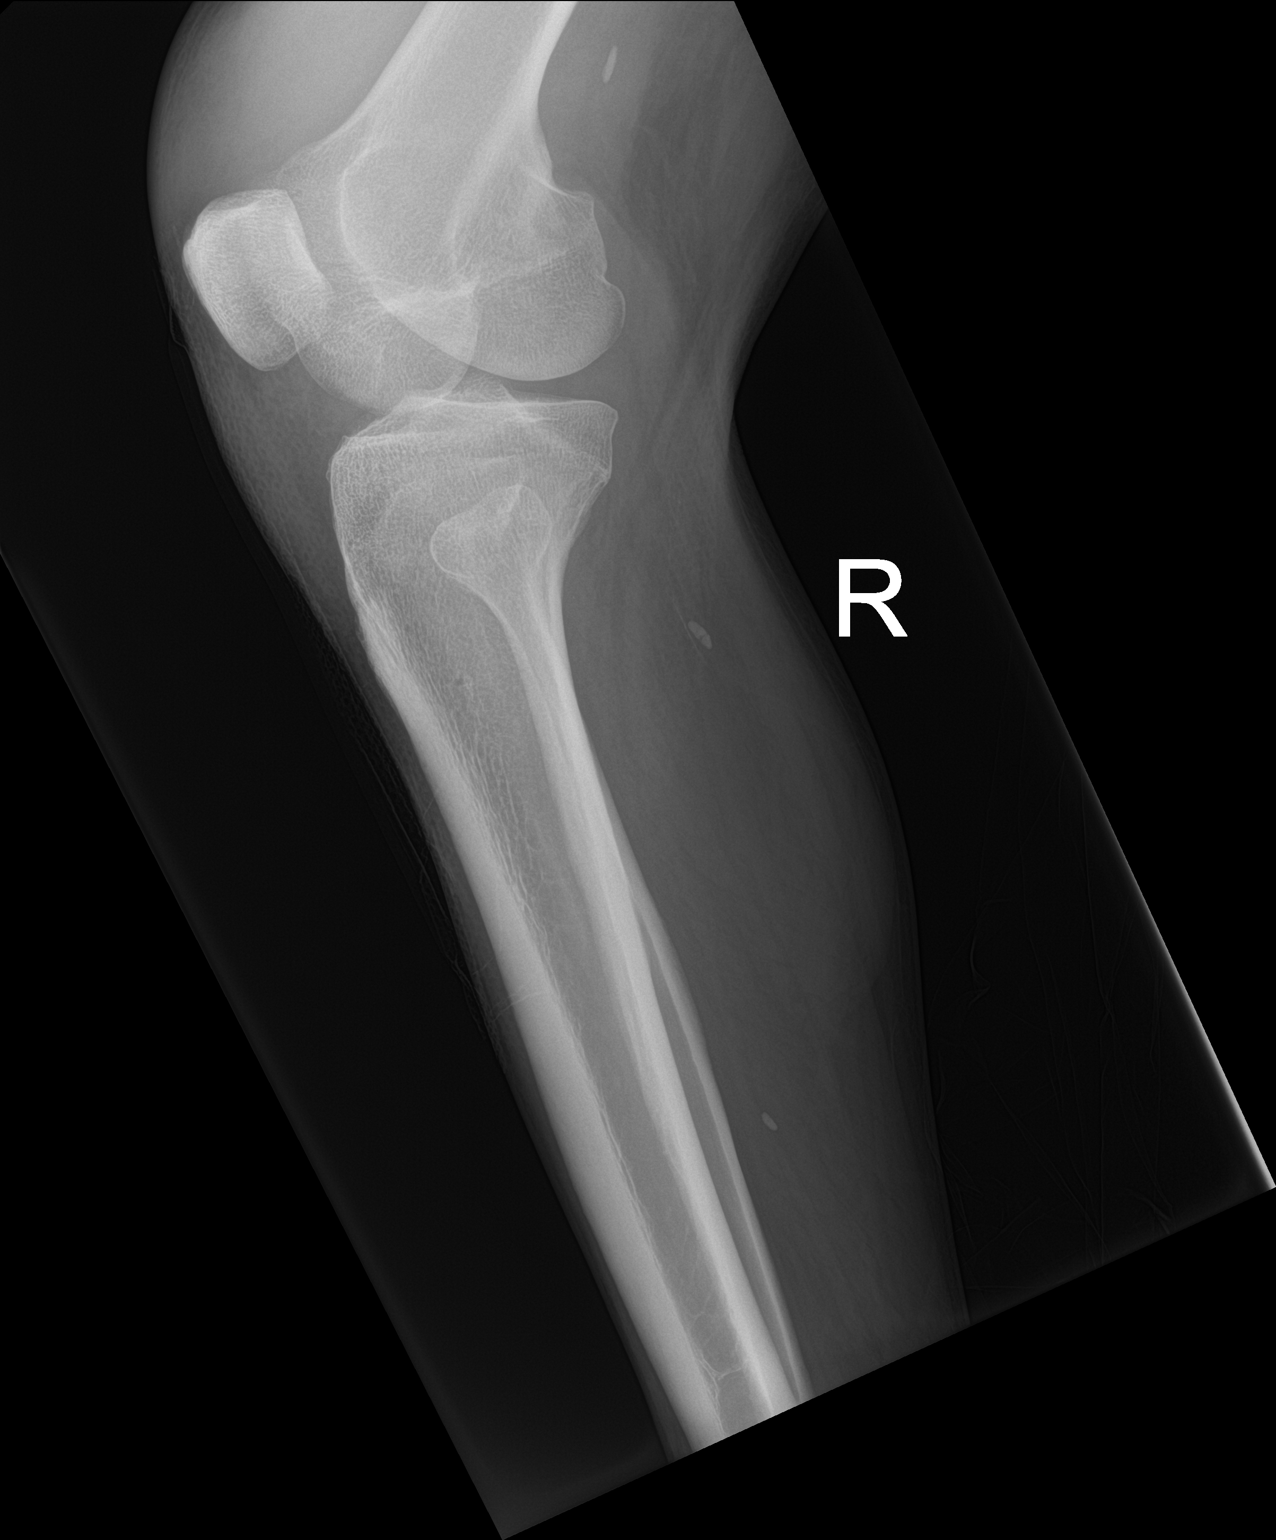
[im 3/4]
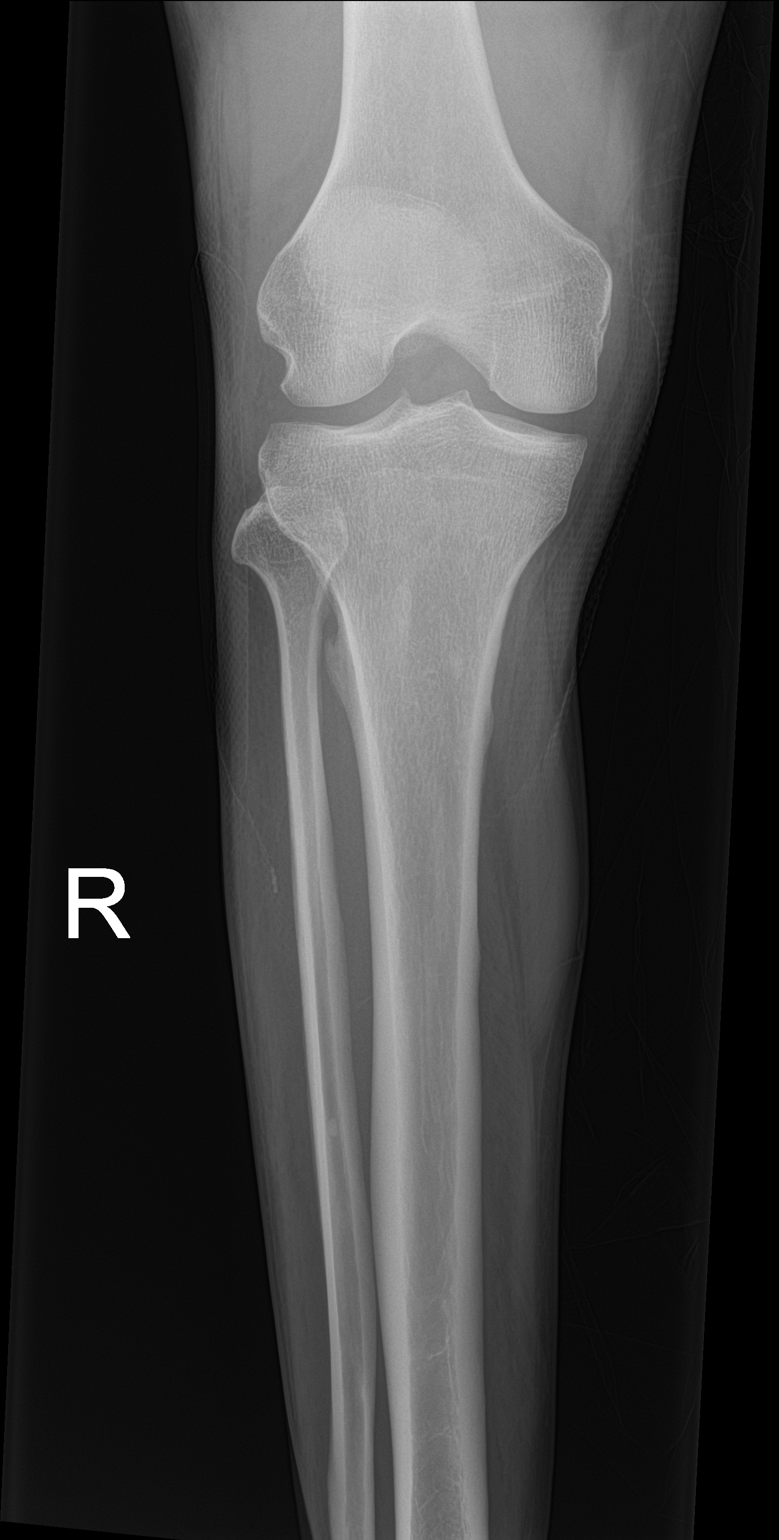
[im 4/4]
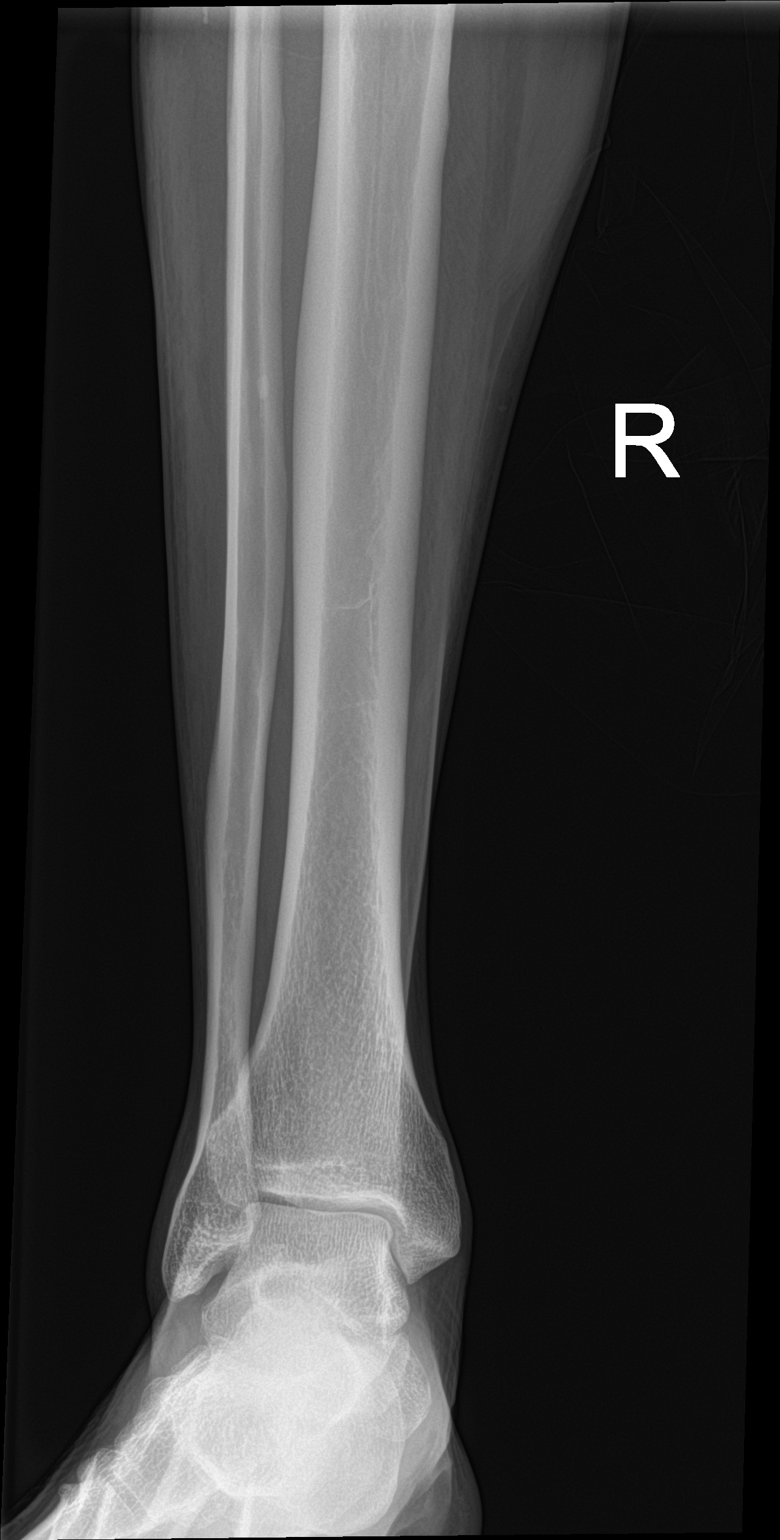

[4 of 4 positions shown; findings below may reference images not displayed]

FINDINGS: There is no evidence of fracture or other focal bone lesions. Of
osteomyelitis. Soft tissue swelling of the anterior distal thigh.
IMPRESSION: Negative for fracture or osteomyelitis.

Soft tissue swelling distal thigh muscles.

## 2021-06-23 MED ORDER — ENSURE ENLIVE PO LIQD
237.0000 mL | Freq: Two times a day (BID) | ORAL | Status: DC
  Administered 2021-06-24 – 2021-06-26 (×5): 237 mL via ORAL

## 2021-06-23 MED ORDER — PROSOURCE PLUS PO LIQD
30.0000 mL | Freq: Three times a day (TID) | ORAL | Status: DC
  Administered 2021-06-23 – 2021-07-02 (×24): 30 mL via ORAL
  Filled 2021-06-23 (×24): qty 30

## 2021-06-23 NOTE — Progress Notes (Signed)
PROGRESS NOTE    Andres Braun  [id number] DOB: 1975/08/28 DOA: 05/24/2021 PCP: Merryl Hacker, No   Brief Narrative:  46 year old male admitted to ICU 1/15 under PCCM care after cardiac arrest at home.  PEA arrest, s/p CPR x20 minutes and epi x5.  Patient reportedly out drinking with his friends the night prior to admission.  Found by friends with agonal breathing.  Intubated on arrival, requiring vasopressors, noted to have profound renal failure, rhabdomyolysis with CK in the 14 K range.  Patient initially treated for aspiration pneumonia, placed on CRRT with nephrology.  Patient ultimately extubated 05/28/2021 remains off pressors.  Transferred to [facility] service 06/02/2021.  Hospital stay complicated by questionable small bowel ischemia and lower extremity neuropathy.  Patient was emergently taking to the OR on 06/02/21 with abnormally thickened bowel but found to have viable tissue.  Imaging reveals profound muscular swelling at the iliopsoas and gluteal muscles with questionable right iliopsoas hemorrhage versus hematoma. IR unable to successfully aspirate the space or perform drain placement. ID consulted for possible infection, continues on cefepime and daptomycin for coverage.  Neurology consulted and ultimately signed off given negative imaging for spinal cord impingement or compression. Lower extremity paresthesias and weakness continue to slowly improve over the week.  Left lower extremity remains flaccid but patient's "pins-and-needles" pain continues to involve, right lower extremity paresthesias resolved, weakness improving.  Right lower extremity anterior skin changes distal to the knee, imaging pending concern for necrosis versus    Assessment & Plan:   Psoas swelling in the setting of severe rhabdo, questionable abscess (HCC) versus hematoma, POA IR unable to drain or aspirate this area Remains afebrile without leukocytosis Blood cultures negative to date ID following - Continue with  current antibiotics cefepime and daptomycin - likely require prolonged antibiotics through 07/10/2021 - transition to doxy/augmentin if discharged prior to 07/10/21  Right anterior knee eschar versus necrosis, involving - Unclear etiology, appears to be worsening around previous IO site - Concern patient had extravasation of pressors or epi during PEA arrest at intake which over time is developed into this narcotic appearing area, general surgery sidelined recommending orthopedic evaluation given proximity to right lower extremity joint(just distal to the knee anteriorly) -Wound care following  Bilateral lower extremity weakness/pain, improving -MRI unremarkable for any impingement or stenosis -Likely neurological/ lumbar plexus ischemic injury due to previous swelling/compression/resolving compartment syndrome -Transition off Dilaudid to gabapentin for neuropathic pain control -Continues to improve and build strength and sensation over the past 24 hours, follow-up PT recommendations -cannot qualify for placement  Cardiac arrest with pulseless electrical activity ([place]) POA,, resolved S/p PEA arrest, s/p CPR x 20 minutes and Epi x 5. Remains stable   AKI versus overt renal failure in the setting of profound metabolic acidosis and rhabdomyolysis, resolving Nephrology signed off given marked improvement Last session iHD 06/13/2021 Reassuringly urine output continues to improve, labs normalizing   Hyponatremia, hypervolemic, improving with dialysis Resolved post HD - WNL   Thrombocytosis Reactive given above   Acute posthemorrhagic anemia s/p 2 unit prbc (1/31,2/5) -continue to follow clinically S/p iv iron 2/6   Elevated liver enzymes Probably from ETOH abuse, shock liver and rhabdomyolysis.  Resolved   Protein-calorie malnutrition, severe Supplement was added   Aspiration pneumonia of both lower lobes due to vomit ([place])- (present on admission) Completed course of abx, no longer  hypoxic   Acute metabolic encephalopathy- (present on admission) Resolved, at baseline   Septic shock due to undetermined organism [facility])- (present on admission)  Vital stable   Acute respiratory failure with hypoxia and hypercapnia ([place])- (present on admission) Patient intubated on 05/24/2021 and extubated on 05/28/2021 .  patient completed course of antibiotics for aspiration pneumonia  On RA   DVT prophylaxis: heparin Code Status:full Family Communication: none  Status is: Inpatient  Dispo: The patient is from: Home              Anticipated d/c is to: To be determined -likely going to be a difficult discharge due to his immigration status and lack of insurance              Anticipated d/c date is: 72+ hours              Patient currently not medically stable for discharge  Consultants:  Nephrology, PCCM, neurology, neurosurgery, orthopedic surgery  Procedures:  Exploratory laparoscopy as above  Antimicrobials:  Cefepime, daptomycin ongoing per ID  Subjective: No acute issues or events overnight, leg pain pain resolved at this point, left leg weakness/paraesthesias ongoing  Objective: Vitals:   06/22/21 1457 06/22/21 1828 06/23/21 0255 06/23/21 0453  BP: (!) 113/93 124/87 119/89 126/84  Pulse:  (!) 118 (!) 111 (!) 115  Resp:  18 20 17   Temp:  98.3 F (36.8 C) 99.2 F (37.3 C) 98.4 F (36.9 C)  TempSrc:      SpO2:  99% 98% 99%  Weight:      Height:        Intake/Output Summary (Last 24 hours) at 06/23/2021 0726 Last data filed at 06/23/2021 0453 Gross per 24 hour  Intake 313.2 ml  Output 1950 ml  Net -1636.8 ml    Filed Weights   06/18/21 0500 06/21/21 0619 06/22/21 0500  Weight: 84 kg 71.5 kg 70.6 kg    Examination:  General:  Pleasantly resting in bed, No acute distress. HEENT:  Normocephalic atraumatic.  Sclerae nonicteric, noninjected.  Extraocular movements intact bilaterally. Neck:  Without mass or deformity.  Trachea is midline. Lungs:  Clear to  auscultate bilaterally without rhonchi, wheeze, or rales. Heart:  Regular rate and rhythm.  Without murmurs, rubs, or gallops. Abdomen:  Soft, nontender, nondistended.  Without guarding or rebound. Extremities: Without cyanosis, clubbing, edema; right lower extremity strength 4 out of 5 without paresthesias, left lower extremity flaccid, painful paresthesias and nerve pain diffusely from the pelvis distally Vascular:  Dorsalis pedis and posterior tibial pulses palpable bilaterally. Skin: Right lower extremity anterior wound from previous IO placement:     Data Reviewed: I have personally reviewed following labs and imaging studies  CBC: Recent Labs  Lab 06/18/21 0500  WBC 15.8*  HGB 8.3*  HCT 26.5*  MCV 87.2  PLT 797*    Basic Metabolic Panel: Recent Labs  Lab 06/18/21 0500 06/19/21 0408 06/20/21 0145 06/21/21 0259 06/22/21 0307  NA 135 136 137 136 133*  K 4.1 3.9 4.1 4.1 4.4  CL 103 104 107 104 102  CO2 22 21* 20* 20* 20*  GLUCOSE 112* 105* 102* 116* 99  BUN 47* 41* 37* 38* 39*  CREATININE 3.43* 2.70* 2.41* 2.23* 2.00*  CALCIUM 8.6* 8.7* 9.1 9.2 9.4  PHOS 3.4 3.4 3.3 3.8 3.7    GFR: Estimated Creatinine Clearance: 46.6 mL/min (A) (by C-G formula based on SCr of 2 mg/dL (H)). Liver Function Tests: Recent Labs  Lab 06/18/21 0500 06/19/21 0408 06/20/21 0145 06/21/21 0259 06/22/21 0307  ALBUMIN 2.1* 2.2* 2.1* 2.2* 2.3*    No results for input(s): LIPASE, AMYLASE in the last  168 hours. No results for input(s): AMMONIA in the last 168 hours. Coagulation Profile: No results for input(s): INR, PROTIME in the last 168 hours. Cardiac Enzymes: Recent Labs  Lab 06/18/21 0500 06/21/21 0259  CKTOTAL 103 95    BNP (last 3 results) No results for input(s): PROBNP in the last 8760 hours. HbA1C: No results for input(s): HGBA1C in the last 72 hours. CBG: No results for input(s): GLUCAP in the last 168 hours. Lipid Profile: No results for input(s): CHOL, HDL,  LDLCALC, TRIG, CHOLHDL, LDLDIRECT in the last 72 hours. Thyroid Function Tests: No results for input(s): TSH, T4TOTAL, FREET4, T3FREE, THYROIDAB in the last 72 hours. Anemia Panel: No results for input(s): VITAMINB12, FOLATE, FERRITIN, TIBC, IRON, RETICCTPCT in the last 72 hours. Sepsis Labs: No results for input(s): PROCALCITON, LATICACIDVEN in the last 168 hours.  Recent Results (from the past 240 hour(s))  Resp Panel by RT-PCR (Flu A&B, Covid) Nasopharyngeal Swab     Status: None   Collection Time: 06/14/21 10:16 AM   Specimen: Nasopharyngeal Swab; Nasopharyngeal(NP) swabs in vial transport medium  Result Value Ref Range Status   SARS Coronavirus 2 by RT PCR NEGATIVE NEGATIVE Final    Comment: (NOTE) SARS-CoV-2 target nucleic acids are NOT DETECTED.  The SARS-CoV-2 RNA is generally detectable in upper respiratory specimens during the acute phase of infection. The lowest concentration of SARS-CoV-2 viral copies this assay can detect is 138 copies/mL. A negative result does not preclude SARS-Cov-2 infection and should not be used as the sole basis for treatment or other patient management decisions. A negative result may occur with  improper specimen collection/handling, submission of specimen other than nasopharyngeal swab, presence of viral mutation(s) within the areas targeted by this assay, and inadequate number of viral copies(<138 copies/mL). A negative result must be combined with clinical observations, patient history, and epidemiological information. The expected result is Negative.  Fact Sheet for Patients:  EntrepreneurPulse.com.au  Fact Sheet for Healthcare Providers:  IncredibleEmployment.be  This test is no t yet approved or cleared by the [place] FDA and  has been authorized for detection and/or diagnosis of SARS-CoV-2 by FDA under an Emergency Use Authorization (EUA). This EUA will remain  in effect (meaning this test  can be used) for the duration of the COVID-19 declaration under Section 564(b)(1) of the Act, 21 U.S.C.section 360bbb-3(b)(1), unless the authorization is terminated  or revoked sooner.       Influenza A by PCR NEGATIVE NEGATIVE Final   Influenza B by PCR NEGATIVE NEGATIVE Final    Comment: (NOTE) The Xpert Xpress SARS-CoV-2/FLU/RSV plus assay is intended as an aid in the diagnosis of influenza from Nasopharyngeal swab specimens and should not be used as a sole basis for treatment. Nasal washings and aspirates are unacceptable for Xpert Xpress SARS-CoV-2/FLU/RSV testing.  Fact Sheet for Patients: EntrepreneurPulse.com.au  Fact Sheet for Healthcare Providers: IncredibleEmployment.be  This test is not yet approved or cleared by the [place] FDA and has been authorized for detection and/or diagnosis of SARS-CoV-2 by FDA under an Emergency Use Authorization (EUA). This EUA will remain in effect (meaning this test can be used) for the duration of the COVID-19 declaration under Section 564(b)(1) of the Act, 21 U.S.C. section 360bbb-3(b)(1), unless the authorization is terminated or revoked.  Performed at [facility], [place] [address] [place], [place] [address]    Radiology Studies: No results found.  Scheduled Meds:  (feeding supplement) PROSource Plus  30 mL Oral BID BM   amLODipine  5 mg Oral Daily   calcium acetate  1,334 mg Oral TID WC   collagenase   Topical Daily   feeding supplement (NEPRO CARB STEADY)  237 mL Oral QHS   folic acid  1 mg Per Tube Daily   gabapentin  200 mg Oral TID   Gerhardt's butt cream   Topical TID   heparin injection (subcutaneous)  5,000 Units Subcutaneous Q8H   multivitamin  1 tablet Oral QHS   pantoprazole  40 mg Oral BID   Continuous Infusions:  sodium chloride 250 mL (06/22/21 2157)   ceFEPime (MAXIPIME) IV 2 g (06/22/21 2155)   DAPTOmycin (CUBICIN)  IV 700 mg (06/22/21 2300)     LOS: 30  days   Time spent: 23min  Keelia Graybill C Termaine Roupp, DO [facility]  If 7PM-7AM, please contact night-coverage [email or web address]  06/23/2021, 7:26 AM

## 2021-06-23 NOTE — NC FL2 (Signed)
[place] LEVEL OF CARE SCREENING TOOL     IDENTIFICATION  Patient Name: Andres Braun Birthdate: [birth year] Sex: male Admission Date (Current Location): 05/24/2021  [facility] and [place] Number:  Herbalist and Address:  [facility], [place] [address], [place], [place] [address]      Provider Number: [id number]  Attending Physician Name and Address:  Little Ishikawa, MD  Relative Name and Phone Number:  Jethro Poling     [phone]    Current Level of Care: Hospital Recommended Level of Care: [place] Prior Approval Number:    Date Approved/Denied:   PASRR Number:    Discharge Plan: SNF    Current Diagnoses: Patient Active Problem List   Diagnosis Date Noted   Ischemic enteritis versus colitis with ileus 06/12/2021   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[place]) 06/12/2021   Lower extremity weakness with left foot drop 06/12/2021   right anterior knee wound 06/12/2021   Hyponatremia 06/12/2021   Mechanical fall 1/24 and hit his head 06/12/2021   Acute posthemorrhagic anemia 06/12/2021   Elevated liver enzymes 06/12/2021   Thrombocytosis 06/12/2021   Protein-calorie malnutrition, severe 06/03/2021   Aspiration pneumonia of both lower lobes due to vomit ([place]) 05/27/2021   Acute respiratory failure with hypoxia and hypercapnia (HCC)    Septic shock due to undetermined organism ([place])    AKI (acute kidney injury) ([place])    Acute metabolic encephalopathy    Cardiac arrest with pulseless electrical activity ([place]) 05/24/2021    Orientation RESPIRATION BLADDER Height & Weight     Self, Time, Situation, Place  Normal Continent Weight: 155 lb 10.3 oz (70.6 kg) Height:  6' (182.9 cm)  BEHAVIORAL SYMPTOMS/MOOD NEUROLOGICAL BOWEL NUTRITION STATUS      Continent Diet (see d/c summary)  AMBULATORY STATUS COMMUNICATION OF NEEDS Skin   Extensive Assist Verbally Surgical wounds                       Personal Care  Assistance Level of Assistance  Bathing, Feeding, Dressing Bathing Assistance: Limited assistance Feeding assistance: Independent Dressing Assistance: Limited assistance     Functional Limitations Info  Sight, Hearing, Speech Sight Info: Adequate Hearing Info: Adequate      SPECIAL CARE FACTORS FREQUENCY  PT (By licensed PT), OT (By licensed OT)     PT Frequency: 5x/ week OT Frequency: 5x/ week            Contractures Contractures Info: Not present    Additional Factors Info  Code Status, Allergies Code Status Info: Full Allergies Info: NKA           Current Medications (06/23/2021):  This is the current hospital active medication list Current Facility-Administered Medications  Medication Dose Route Frequency Provider Last Rate Last Admin   (feeding supplement) PROSource Plus liquid 30 mL  30 mL Oral BID BM Karleen Hampshire, Vijaya, MD   30 mL at 06/23/21 1052   0.9 %  sodium chloride infusion  250 mL Intravenous Continuous Rolm Bookbinder, MD 10 mL/hr at 06/22/21 2157 250 mL at 06/22/21 2157   acetaminophen (TYLENOL) tablet 650 mg  650 mg Oral Q6H PRN Rise Patience, MD   650 mg at 06/22/21 2317   albuterol (PROVENTIL) (2.5 MG/3ML) 0.083% nebulizer solution 2.5 mg  2.5 mg Nebulization Q2H PRN Rolm Bookbinder, MD   2.5 mg at 05/24/21 1143   amLODipine (NORVASC) tablet 5 mg  5 mg Oral Daily Claudia Desanctis,  MD   5 mg at 06/23/21 1052   calcium acetate (PHOSLO) capsule 1,334 mg  1,334 mg Oral TID WC Corliss Parish, MD   1,334 mg at 06/23/21 0854   ceFEPIme (MAXIPIME) 2 g in sodium chloride 0.9 % 100 mL IVPB  2 g Intravenous Q12H Rolla Flatten, RPH 200 mL/hr at 06/22/21 2155 2 g at 06/22/21 2155   collagenase (SANTYL) ointment   Topical Daily Little Ishikawa, MD   Given at 06/23/21 1055   DAPTOmycin (CUBICIN) 700 mg in sodium chloride 0.9 % IVPB  8 mg/kg Intravenous Q2000 Rolla Flatten, [facility] 128 mL/hr at 06/22/21 2300 700 mg at 06/22/21 2300   feeding  supplement (NEPRO CARB STEADY) liquid 237 mL  237 mL Oral QHS Hosie Poisson, MD   237 mL at [id number]   folic acid (FOLVITE) tablet 1 mg  1 mg Per Tube Daily Rolm Bookbinder, MD   1 mg at 06/23/21 1052   gabapentin (NEURONTIN) capsule 200 mg  200 mg Oral TID Little Ishikawa, MD   200 mg at 06/23/21 1052   Gerhardt's butt cream   Topical TID Swayze, Ava, DO   Given at 06/22/21 2300   guaiFENesin-dextromethorphan (ROBITUSSIN DM) 100-10 MG/5ML syrup 5 mL  5 mL Oral Q4H PRN Olalere, Adewale A, MD       heparin injection 5,000 Units  5,000 Units Subcutaneous Jackie Plum, MD   5,000 Units at 06/23/21 [id number]   hydrALAZINE (APRESOLINE) injection 10 mg  10 mg Intravenous Q6H PRN Rolm Bookbinder, MD       multivitamin (RENA-VIT) tablet 1 tablet  1 tablet Oral QHS Swayze, Ava, DO   1 tablet at 06/22/21 2149   oxyCODONE (Oxy IR/ROXICODONE) immediate release tablet 5-10 mg  5-10 mg Oral Q4H PRN Jillyn Ledger, PA-C   10 mg at 06/23/21 0854   pantoprazole (PROTONIX) EC tablet 40 mg  40 mg Oral BID Hosie Poisson, MD   40 mg at 06/23/21 1053     Discharge Medications: Please see discharge summary for a list of discharge medications.  Relevant Imaging Results:  Relevant Lab Results:   Additional Information Patient is spanish speaking  Ebb Carelock F Ranell Finelli, LCSWA

## 2021-06-23 NOTE — Consult Note (Signed)
Reason for Consult:Right lower leg wound Referring Physician: Kris Mouton Time called: 1142 Time at bedside: 1147   Andres Braun is an 46 y.o. male.  HPI: Andres Braun was admitted post arrest about a month ago. He's had a mildly complicated hospital course. He quickly developed a large wound on his lower leg at the site of a previous IO and this has been treated by [facility] RN since 1/19.  History reviewed. No pertinent past medical history.  Past Surgical History:  Procedure Laterality Date   INSERTION OF DIALYSIS CATHETER Right 06/01/2021   Procedure: INSERTION OF DIALYSIS CATHETER;  Surgeon: Lorin Glass, MD;  Location: [facility] ENDOSCOPY;  Service: Pulmonary;  Laterality: Right;   LAPAROSCOPY N/A 06/02/2021   Procedure: LAPAROSCOPY DIAGNOSTIC;  Surgeon: Emelia Loron, MD;  Location: [facility] OR;  Service: General;  Laterality: N/A;    History reviewed. No pertinent family history.  Social History:  reports that he has quit smoking. His smoking use included cigarettes. He has quit using smokeless tobacco. He reports current alcohol use. He reports that he does not currently use drugs.  Allergies: No Known Allergies  Medications: I have reviewed the patient's current medications.  Results for orders placed or performed during the hospital encounter of 05/24/21 (from the past 48 hour(s))  Renal function panel     Status: Abnormal   Collection Time: 06/22/21  3:07 AM  Result Value Ref Range   Sodium 133 (L) 135 - 145 mmol/L   Potassium 4.4 3.5 - 5.1 mmol/L   Chloride 102 98 - 111 mmol/L   CO2 20 (L) 22 - 32 mmol/L   Glucose, Bld 99 70 - 99 mg/dL    Comment: Glucose reference range applies only to samples taken after fasting for at least 8 hours.   BUN 39 (H) 6 - 20 mg/dL   Creatinine, Ser 1.61 (H) 0.61 - 1.24 mg/dL   Calcium 9.4 8.9 - 09.6 mg/dL   Phosphorus 3.7 2.5 - 4.6 mg/dL   Albumin 2.3 (L) 3.5 - 5.0 g/dL   GFR, Estimated 41 (L) >60 mL/min    Comment: (NOTE) Calculated using  the CKD-EPI Creatinine Equation (2021)    Anion gap 11 5 - 15    Comment: Performed at [facility], [address], [place], [place] [address]  CBC     Status: Abnormal   Collection Time: 06/23/21  7:42 AM  Result Value Ref Range   WBC 11.0 (H) 4.0 - 10.5 K/uL   RBC 3.56 (L) 4.22 - 5.81 MIL/uL   Hemoglobin 9.2 (L) 13.0 - 17.0 g/dL   HCT 98.1 (L) 19.1 - 47.8 %   MCV 86.0 80.0 - 100.0 fL   MCH 25.8 (L) 26.0 - 34.0 pg   MCHC 30.1 30.0 - 36.0 g/dL   RDW 29.5 (H) 62.1 - 30.8 %   Platelets 873 (H) 150 - 400 K/uL   nRBC 0.0 0.0 - 0.2 %    Comment: Performed at [facility], [address] [place], [place] [address]    No results found.  Review of Systems  Constitutional:  Positive for fatigue. Negative for chills, diaphoresis and fever.  HENT:  Negative for ear discharge, ear pain, hearing loss and tinnitus.   Eyes:  Negative for photophobia and pain.  Respiratory:  Negative for cough and shortness of breath.   Cardiovascular:  Negative for chest pain.  Gastrointestinal:  Negative for abdominal pain, nausea and vomiting.  Genitourinary:  Negative for dysuria, flank  pain, frequency and urgency.  Musculoskeletal:  Negative for back pain, myalgias and neck pain.  Skin:  Positive for wound.  Neurological:  Positive for weakness. Negative for dizziness and headaches.  Hematological:  Does not bruise/bleed easily.  Psychiatric/Behavioral:  The patient is not nervous/anxious.   Blood pressure 128/87, pulse (!) 120, temperature 98.7 F (37.1 C), temperature source Oral, resp. rate 18, height 6' (1.829 m), weight 70.6 kg, SpO2 100 %. Physical Exam Constitutional:      General: He is not in acute distress.    Appearance: He is well-developed. He is not diaphoretic.  HENT:     Head: Normocephalic and atraumatic.  Eyes:     General: No scleral icterus.       Right eye: No discharge.        Left eye: No discharge.     Conjunctiva/sclera: Conjunctivae normal.  Cardiovascular:      Rate and Rhythm: Normal rate and regular rhythm.  Pulmonary:     Effort: Pulmonary effort is normal. No respiratory distress.  Musculoskeletal:     Cervical back: Normal range of motion.     Comments: RLE No traumatic wounds, ecchymosis, or rash  Anterior shield-shaped wound between knee and tibial tubercle, no discharge or malodor, some granulation noted but large central area of necrosis/eschar  No knee or ankle effusion  Knee stable to varus/ valgus and anterior/posterior stress  Sens DPN, SPN, TN paresthetic  Motor EHL, ext, flex, evers 3/5  DP 2+, PT 2+, No significant edema  Skin:    General: Skin is warm and dry.  Neurological:     Mental Status: He is alert.  Psychiatric:        Mood and Affect: Mood normal.        Behavior: Behavior normal.    Assessment/Plan: Right lower leg wound -- Will start with x-ray but likely will need MRI f/u. May benefit from I&D; Dr. Lajoyce Corners to evaluate later today or in AM.    Freeman Caldron, PA-C Orthopedic Surgery [phone] 06/23/2021, 11:58 AM

## 2021-06-23 NOTE — Progress Notes (Signed)
Nutrition Follow-up  DOCUMENTATION CODES:   Severe malnutrition in context of acute illness/injury  INTERVENTION:  -d/c Double protein portions with meals given pt with poor appetite 2/2 pain; also recommend increasing pain control regimen -d/c Nepro given pt consistently refusing supplement -Trial Ensure Enlive po BID, each supplement provides 350 kcal and 20 grams of protein given pt refusing meals at this time -Increase frequency to 30 ml Prosource Plus TID, each supplement provides 100 kcals and 15 grams protein -Continue Renal MVI daily  If po intake does not improve, may need to consider re-initiation of enteral nutrition to help meet kcal/protein needs.   NUTRITION DIAGNOSIS:   Severe Malnutrition related to acute illness (cardiac arrest, AKI, ileus) as evidenced by energy intake < or equal to 50% for > or equal to 5 days, percent weight loss (7.4% weight loss in less than 2 weeks).  ongoing  GOAL:   Patient will meet greater than or equal to 90% of their needs  progressing  MONITOR:   PO intake, Supplement acceptance, Diet advancement, Labs, Weight trends  REASON FOR ASSESSMENT:   Ventilator, Consult Enteral/tube feeding initiation and management  ASSESSMENT:   46 yo male admitted post OOH cardiac arrest, acute respiratory failure post arrest, possible aspiration-intubated, AKI requiring CRRT. No PMH on file. Pt is spanish speaking  01/15 - admitted, intubated, CRRT initiated 01/16 - TTM initiated 01/19 - extubated, CRRT held 01/20 - diet advanced to Regular 01/21 - NPO due to concern for bowel ischemia 01/22 - full liquid diet, first HD treatment 01/23 - NPO 01/24 - s/p diagnostic laparoscopy revealing viable but thickened bowel without obstruction, second HD treatment 01/25 - clear liquid diet 1/26- advanced to soft diet, third HD treatment  Pt's hospital course has been further complicated by R anterior knee eschar versus necrosis of unclear etiology  which appears to be worsening around previous I/O site. Per MD, concern patient had extravasation of pressors or epi during PEA arrest at intake which over time is developed into this narcotic appearing area. general surgery sidelined recommending orthopedic evaluation given proximity to RLE joint (just distal to the knee anteriorly). WOC following.  Nephrology continues to follow and assessing pt's need for IHD -- last tx was 2/04 with net UF.   Pt had been doing well with meals up until the last couple of days when he stopped eating 2/2 pain. Pt also refusing Nepro. Pt is doing well with Prosource. Will increase prosource given pt doing well with supplement. Will also d/c nepro given consistent refusal and trial Ensure given pt refusing meals at this time. If po intake does not improve, may need to consider re-initiation of enteral nutrition to help meet kcal/protein needs.   UOP: x24 hours I/O: -26L since admit  Current weight: 70.6 kg Admit weight: 93.5 kg   Medications: 94ml Prosource Plus BID, Phoslo, Aranesp, Nepro Carb Steady daily, Folvite, Rena-vit, Protonix, IV abx Labs: Na 133 (L)  Diet Order:   Diet Order             DIET SOFT Room service appropriate? Yes; Fluid consistency: Thin  Diet effective now                   EDUCATION NEEDS:   Not appropriate for education at this time  Skin:  Skin Assessment: Skin Integrity Issues: Skin Integrity Issues:: Incisions, Other (Comment) Incisions: abdomen Other: rt knee puncture, sternal burn due to defibrilation pads  Last BM:  2/14  Height:  Ht Readings from Last 1 Encounters:  06/02/21 6' (1.829 m)    Weight:   Wt Readings from Last 1 Encounters:  06/22/21 70.6 kg    BMI:  Body mass index is 21.11 kg/m.  Estimated Nutritional Needs:   Kcal:  2200-2400  Protein:  120-135 grams  Fluid:  1000 ml + UOP     Andres Frediani A., MS, RD, LDN (she/her/hers) RD pager number and weekend/on-call pager  number located in Amion.

## 2021-06-23 NOTE — TOC Progression Note (Signed)
Transition of Care [facility]) - Initial/Assessment Note    Patient Details  Name: Andres Braun MRN: [id number] Date of Birth: 08-May-1976  Transition of Care [facility]) CM/SW Contact:    Milinda Antis, [place] Phone Number: 06/23/2021, 11:02 AM  Clinical Narrative:                 CSW spoke with Morey Hummingbird at [facility] to inquire about whether the facility could accept the patient under private pay.  CSW faxed the referral and is awaiting a response.   Expected Discharge Plan: IP Rehab Facility Barriers to Discharge: Continued Medical Work up   Patient Goals and CMS Choice Patient states their goals for this hospitalization and ongoing recovery are:: To return home CMS Medicare.gov Compare Post Acute Care list provided to:: Patient Choice offered to / list presented to : Patient  Expected Discharge Plan and Services Expected Discharge Plan: [place] In-house Referral: Interpreting Services, PCP / [facility] Discharge Planning Services: CM Consult, Follow-up appt scheduled, [facility] Program Post Acute Care Choice: IP Rehab Living arrangements for the past 2 months: Single Family Home                                      Prior Living Arrangements/Services Living arrangements for the past 2 months: Single Family Home Lives with:: Self Patient language and need for interpreter reviewed:: Yes Do you feel safe going back to the place where you live?: Yes      Need for Family Participation in Patient Care: Yes (Comment) Care giver support system in place?: Yes (comment)   Criminal Activity/Legal Involvement Pertinent to Current Situation/Hospitalization: No - Comment as needed  Activities of Daily Living Home Assistive Devices/Equipment: None ADL Screening (condition at time of admission) Patient's cognitive ability adequate to safely complete daily activities?: Yes Is the patient deaf or have difficulty hearing?: No Does the patient have difficulty seeing,  even when wearing glasses/contacts?: No Does the patient have difficulty concentrating, remembering, or making decisions?: No Patient able to express need for assistance with ADLs?: Yes Does the patient have difficulty dressing or bathing?: No Independently performs ADLs?: Yes (appropriate for developmental age) Does the patient have difficulty walking or climbing stairs?: No Weakness of Legs: None Weakness of Arms/Hands: None  Permission Sought/Granted Permission sought to share information with : Case Manager, Customer service manager, Family Supports Permission granted to share information with : Yes, Verbal Permission Granted              Emotional Assessment Appearance:: Appears stated age Attitude/Demeanor/Rapport: Engaged, Gracious Affect (typically observed): Accepting, Appropriate, Calm, Hopeful Orientation: : Oriented to Self, Oriented to Place, Oriented to  Time, Oriented to Situation Alcohol / Substance Use: Not Applicable Psych Involvement: No (comment)  Admission diagnosis:  Cardiac arrest ([place]) [I46.9] Shock ([place]) [R57.9] Hypothermia, initial encounter [T68.XXXA] Acute respiratory failure with hypoxia and hypercapnia (HCC) [J96.01, J96.02] Patient Active Problem List   Diagnosis Date Noted   Ischemic enteritis versus colitis with ileus 06/12/2021   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[place]) 06/12/2021   Lower extremity weakness with left foot drop 06/12/2021   right anterior knee wound 06/12/2021   Hyponatremia 06/12/2021   Mechanical fall 1/24 and hit his head 06/12/2021   Acute posthemorrhagic anemia 06/12/2021   Elevated liver enzymes 06/12/2021   Thrombocytosis 06/12/2021   Protein-calorie malnutrition, severe 06/03/2021   Aspiration pneumonia of both lower lobes due to  vomit ([place]) 05/27/2021   Acute respiratory failure with hypoxia and hypercapnia (HCC)    Septic shock due to undetermined organism ([place])    AKI (acute kidney injury) ([place])    Acute metabolic  encephalopathy    Cardiac arrest with pulseless electrical activity ([place]) 05/24/2021   PCP:  Pcp, No Pharmacy:  No Pharmacies Listed    Social Determinants of Health (SDOH) Interventions    Readmission Risk Interventions No flowsheet data found.

## 2021-06-24 DIAGNOSIS — S81002A Unspecified open wound, left knee, initial encounter: Secondary | ICD-10-CM

## 2021-06-24 DIAGNOSIS — I96 Gangrene, not elsewhere classified: Secondary | ICD-10-CM

## 2021-06-24 DIAGNOSIS — Z8674 Personal history of sudden cardiac arrest: Secondary | ICD-10-CM

## 2021-06-24 DIAGNOSIS — S81801A Unspecified open wound, right lower leg, initial encounter: Secondary | ICD-10-CM

## 2021-06-24 DIAGNOSIS — Z87891 Personal history of nicotine dependence: Secondary | ICD-10-CM

## 2021-06-24 DIAGNOSIS — I739 Peripheral vascular disease, unspecified: Secondary | ICD-10-CM

## 2021-06-24 DIAGNOSIS — S81001A Unspecified open wound, right knee, initial encounter: Secondary | ICD-10-CM

## 2021-06-24 DIAGNOSIS — S91001A Unspecified open wound, right ankle, initial encounter: Secondary | ICD-10-CM

## 2021-06-24 LAB — CBC
HCT: 30.4 % — ABNORMAL LOW (ref 39.0–52.0)
Hemoglobin: 9.3 g/dL — ABNORMAL LOW (ref 13.0–17.0)
MCH: 25.8 pg — ABNORMAL LOW (ref 26.0–34.0)
MCHC: 30.6 g/dL (ref 30.0–36.0)
MCV: 84.4 fL (ref 80.0–100.0)
Platelets: 898 10*3/uL — ABNORMAL HIGH (ref 150–400)
RBC: 3.6 MIL/uL — ABNORMAL LOW (ref 4.22–5.81)
RDW: 15.5 % (ref 11.5–15.5)
WBC: 10.7 10*3/uL — ABNORMAL HIGH (ref 4.0–10.5)
nRBC: 0 % (ref 0.0–0.2)

## 2021-06-24 LAB — BASIC METABOLIC PANEL
Anion gap: 12 (ref 5–15)
BUN: 40 mg/dL — ABNORMAL HIGH (ref 6–20)
CO2: 19 mmol/L — ABNORMAL LOW (ref 22–32)
Calcium: 9.5 mg/dL (ref 8.9–10.3)
Chloride: 102 mmol/L (ref 98–111)
Creatinine, Ser: 1.64 mg/dL — ABNORMAL HIGH (ref 0.61–1.24)
GFR, Estimated: 52 mL/min — ABNORMAL LOW (ref >60)
Glucose, Bld: 99 mg/dL (ref 70–99)
Potassium: 3.7 mmol/L (ref 3.5–5.1)
Sodium: 133 mmol/L — ABNORMAL LOW (ref 135–145)

## 2021-06-24 MED ORDER — SERTRALINE HCL 50 MG PO TABS
25.0000 mg | ORAL_TABLET | Freq: Every day | ORAL | Status: DC
  Administered 2021-06-24 – 2021-07-06 (×13): 25 mg via ORAL
  Filled 2021-06-24 (×13): qty 1

## 2021-06-24 MED ORDER — LIDOCAINE 5 % EX PTCH
1.0000 | MEDICATED_PATCH | CUTANEOUS | Status: DC
  Administered 2021-06-24 – 2021-07-06 (×13): 1 via TRANSDERMAL
  Filled 2021-06-24 (×13): qty 1

## 2021-06-24 MED ORDER — DEXTROSE-NACL 5-0.9 % IV SOLN: INTRAVENOUS | Status: DC

## 2021-06-24 MED ORDER — DOCUSATE SODIUM 100 MG PO CAPS
100.0000 mg | ORAL_CAPSULE | Freq: Two times a day (BID) | ORAL | Status: DC
  Administered 2021-06-24 – 2021-07-07 (×25): 100 mg via ORAL
  Filled 2021-06-24 (×27): qty 1

## 2021-06-24 MED ORDER — MAGNESIUM HYDROXIDE 400 MG/5ML PO SUSP
30.0000 mL | Freq: Once | ORAL | Status: AC
  Administered 2021-06-24: 30 mL via ORAL
  Filled 2021-06-24: qty 30

## 2021-06-24 MED ORDER — AMOXICILLIN-POT CLAVULANATE 875-125 MG PO TABS
1.0000 | ORAL_TABLET | Freq: Two times a day (BID) | ORAL | Status: DC
  Administered 2021-06-24 – 2021-07-07 (×26): 1 via ORAL
  Filled 2021-06-24 (×26): qty 1

## 2021-06-24 MED ORDER — PREGABALIN 25 MG PO CAPS
25.0000 mg | ORAL_CAPSULE | Freq: Three times a day (TID) | ORAL | Status: DC
  Administered 2021-06-24 – 2021-06-26 (×7): 25 mg via ORAL
  Filled 2021-06-24 (×7): qty 1

## 2021-06-24 MED ORDER — POLYETHYLENE GLYCOL 3350 17 G PO PACK
17.0000 g | PACK | Freq: Every day | ORAL | Status: DC
  Administered 2021-06-24 – 2021-07-06 (×11): 17 g via ORAL
  Filled 2021-06-24 (×14): qty 1

## 2021-06-24 MED ORDER — DOXYCYCLINE HYCLATE 100 MG PO TABS
100.0000 mg | ORAL_TABLET | Freq: Two times a day (BID) | ORAL | Status: DC
  Administered 2021-06-24 – 2021-07-07 (×26): 100 mg via ORAL
  Filled 2021-06-24 (×26): qty 1

## 2021-06-24 NOTE — Progress Notes (Addendum)
Patient added to DTP list for discharge planning.  Per therapy notes, patient is recommdended for SNF however patient is uninsured and was positive for cocaine on admission. Patient is on [facility] waiting list.  Edwin Dada, MSW, LCSW Transitions of Care   Clinical Social Worker II [phone]

## 2021-06-24 NOTE — Final Progress Note (Signed)
Brief ID Note:  Patient continues on daptomycin and cefepime for possible right psoas hematoma versus abscess.  He was evaluated by orthopedic surgery earlier today due to concern for possible infection at the previous interosseous line site.  However, there does not appear to be any signs of infection at this time and just superficial ischemic eschar.  Patient has remained afebrile over the last several days and WBC improved down to 10.7.  Will continue with current plan for antibiotics through 07/10/2021.  However, given overall clinical improvement and no concerns for infection at prior IO site will change from daptomycin/cefepime to doxycycline/Augmentin to complete therapy.  End date 07/10/2021.  Will sign off, please call as needed.   Vedia Coffer for Infectious Disease [facility] 06/24/2021, 11:15 AM

## 2021-06-24 NOTE — Plan of Care (Signed)
  Problem: Health Behavior/Discharge Planning: Goal: Ability to manage health-related needs will improve Outcome: Not Progressing   

## 2021-06-24 NOTE — Consult Note (Signed)
ORTHOPAEDIC CONSULTATION  REQUESTING PHYSICIAN: Nolberto Hanlon, MD  Chief Complaint: Ulcer right proximal tibia.  HPI: Andres Braun is a 46 y.o. male who presents with status post cardiac event as well as renal injury.  Patient is recovering from both he is status post debridement of the right knee wound and is seen for evaluation for consideration for surgical intervention for the slowly healing wound.  Patient is examined with the interpreter online.  History reviewed. No pertinent past medical history. Past Surgical History:  Procedure Laterality Date   INSERTION OF DIALYSIS CATHETER Right 06/01/2021   Procedure: INSERTION OF DIALYSIS CATHETER;  Surgeon: Candee Furbish, MD;  Location: [facility] ENDOSCOPY;  Service: Pulmonary;  Laterality: Right;   LAPAROSCOPY N/A 06/02/2021   Procedure: LAPAROSCOPY DIAGNOSTIC;  Surgeon: Rolm Bookbinder, MD;  Location: [place];  Service: General;  Laterality: N/A;   Social History   Socioeconomic History   Marital status: Unknown    Spouse name: Not on file   Number of children: Not on file   Years of education: Not on file   Highest education level: Not on file  Occupational History   Not on file  Tobacco Use   Smoking status: Former    Types: Cigarettes   Smokeless tobacco: Former   Tobacco comments:    This is an Urgent case - to OR  Vaping Use   Vaping Use: Not on file  Substance and Sexual Activity   Alcohol use: Yes   Drug use: Not Currently    Comment: pt denies   Sexual activity: Not on file  Other Topics Concern   Not on file  Social History Narrative   Pt separated from significant other 5-6 years ago ([place]). She says she is the only contact that he has here though   Social Determinants of Health   Financial Resource Strain: Not on file  Food Insecurity: Not on file  Transportation Needs: Not on file  Physical Activity: Not on file  Stress: Not on file  Social Connections: Not on file   History reviewed. No pertinent  family history. - negative except otherwise stated in the family history section No Known Allergies Prior to Admission medications   Not on File   DG Tibia/Fibula Right  Result Date: 06/23/2021 CLINICAL DATA:  Osteomyelitis EXAM: No evidence RIGHT TIBIA AND FIBULA - 2 VIEW COMPARISON:  None. FINDINGS: There is no evidence of fracture or other focal bone lesions. Of osteomyelitis. Soft tissue swelling of the anterior distal thigh. IMPRESSION: Negative for fracture or osteomyelitis. Soft tissue swelling distal thigh muscles. Electronically Signed   By: Franchot Gallo M.D.   On: 06/23/2021 16:56   - pertinent xrays, CT, MRI studies were reviewed and independently interpreted  Positive ROS: All other systems have been reviewed and were otherwise negative with the exception of those mentioned in the HPI and as above.  Physical Exam: General: Alert, no acute distress Psychiatric: Patient is competent for consent with normal mood and affect Lymphatic: No axillary or cervical lymphadenopathy Cardiovascular: No pedal edema Respiratory: No cyanosis, no use of accessory musculature GI: No organomegaly, abdomen is soft and non-tender    Images:  @ENCIMAGES @  Labs:  Lab Results  Component Value Date   ESRSEDRATE 85 (H) 06/06/2021   CRP 5.4 (H) 06/06/2021   REPTSTATUS 06/15/2021 FINAL 06/10/2021   REPTSTATUS 06/15/2021 FINAL 06/10/2021   CULT  06/10/2021    NO GROWTH 5 DAYS Performed at [facility]  [address], [place], [place] [address]    CULT  06/10/2021    NO GROWTH 5 DAYS Performed at [facility] [address], [place], [place] [address]    LABORGA PSEUDOMONAS AERUGINOSA (A) 06/10/2021    Lab Results  Component Value Date   ALBUMIN 2.3 (L) 06/22/2021   ALBUMIN 2.2 (L) 06/21/2021   ALBUMIN 2.1 (L) 06/20/2021   PREALBUMIN 15.3 (L) 06/03/2021     CBC EXTENDED Latest Ref Rng & Units 06/23/2021 06/18/2021 06/15/2021  WBC 4.0 - 10.5 K/uL 11.0(H) 15.8(H) 9.4   RBC 4.22 - 5.81 MIL/uL 3.56(L) 3.04(L) 3.13(L)  HGB 13.0 - 17.0 g/dL 9.2(L) 8.3(L) 8.2(L)  HCT 39.0 - 52.0 % 30.6(L) 26.5(L) 26.7(L)  PLT 150 - 400 K/uL 873(H) 797(H) 650(H)  NEUTROABS 1.7 - 7.7 K/uL - - -  LYMPHSABS 0.7 - 4.0 K/uL - - -    Neurologic: Patient does not have protective sensation bilateral lower extremities.   MUSCULOSKELETAL:   Skin: Examination patient has a superficial ischemic ulcer over the right tibial tubercle.  There is good epithelization around the wound edges there is no cellulitis no odor no drainage.  Patient has palpable pulses at the ankle.  Patient has significant weakness he can wiggle his toes on the right but has no ankle plantarflexion or dorsiflexion bilaterally he cannot lift either leg.  Patient cannot weight-bear and hold himself up.  There is a mild effusion of the knee however there is no cellulitis no tenderness to palpation around the knee this appears to be most likely a reactive effusion and not infection.  Patient has severe protein caloric malnutrition with an albumin of 2.3.  Patient's white cell count is 11.0 hemoglobin 9.2.  Assessment: Superficial wound right knee without signs of infection.  Plan: Plan: Continue with routine wound care continue with nutritional support.  With the superficial epithelialization around the black eschar and the eschar being extremely thin this appears that it will resolve on its own.  Thank you for the consult and the opportunity to see [name], [place] [phone] 8:10 AM

## 2021-06-24 NOTE — Progress Notes (Addendum)
Progress Note   Patient: Andres Braun TDD:[id number] DOB: [birth year] DOA: 05/24/2021     31 DOS: the patient was seen and examined on 06/24/2021   Brief hospital course: 46 year old male admitted to ICU 1/15 under PCCM care after cardiac arrest at home.  PEA arrest, s/p CPR x20 minutes and epi x5.  Patient reportedly out drinking with his friends the night prior to admission.  Found by friends with agonal breathing.  Intubated on arrival, needed vasopressors, noted to have renal failure, rhabdomyolysis with CK in the 14 K range, treated for aspiration pneumonia, nephrology consulted and started on CRRT.  Extubated 1/19 and off pressors.  Mental status slowly improved.  Transferred to telemetry.  Care transferred to [facility] on 1/24.  Nephrology following for HD needs.  General surgery on board for questionable small bowel ischemia and taken emergently to surgery 1/24. The bowel in question was found to be thickened, but viable. Hospital course complicated by unexplained left lower extremity weakness. Extensive work up of the neuraxis is negative for compressive pathology. Differential is probably a psoas hematoma causing lumbar plexus compression/ irritation leading to secondary weakness. IR suggested that it is not amenable for aspiration or drain placement. Neurology consulted, suggested not a neurological injury as the neuraxis is negative.    Assessment and Plan: Cardiac arrest with pulseless electrical activity (HCC) S/p PEA arrest, s/p CPR x 20 minutes and Epi x 5. Echocardiogram 1/15 with preserved LV function  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HCC) Bcx negative Cxr possible atelectasis See additional documentation under PVD and recent rhabdomyolysis   Peripheral vascular disease of LEFT lower extremity w/ foot drop Recent rhabdolyolosis MRI of the neuraxis  was unremarkable for acute process . As per the MRI of the lumbar spine, multiple fluid collections in the right ileo psoas muscle.  Dr Radene Gunning spoke with  neurology and neuro surgery on 1/24, no surgical cause for his lower extremity weakness. He suggested that a psoas abscess or hematoma could cause lumbar plexus compression /irritation leading to secondary weakness.   IR consulted for aspiration, suggested any aspiration would predispose to infection/ not amenable for aspiration or drain placement. Did not advise aspiration.  MRI of the femur c/w rhabdomyolysis, with the differential including myositis related to connective tissue disease and polymyositis. No drainable abscess identified. pt unable to move the left lower extremity from severe pain and weakness.  Orthopedics consulted, suggested its a neurological/ lumbar plexus ischemic injury.  Hoping for recovery with therapy evaluations.  CK levels are improving. Vitamin b12 and folate levels wnl. ESR is 85, CRP is 5.4  Arterial duplex w/ ABIs this admission as follows Left: Resting left ankle-brachial index indicates moderate left lower extremity arterial disease. The left toe-brachial index is abnormal. No wounds-consider VVS eval 2/15 change gabapentin to Lyrica We will ask vascular service to see patient     Ischemic enteritis versus colitis with ileus General surgery consulted, diagnostic laparoscopy unremarkable  C. difficile PCR and GI panel negative    AKI (acute kidney injury)/dehydration secondary to persistent poor oral intake- (present on admission) with metabolic acidosis/rhabdomyolysis Acquired CRRT 1/15-1/19/2023. Nephrology had been following but renal function has recovered Current creatinine 1.64 with BUN 40 and GFR 52 Currently in a negative balance of 28 L Patient continues to have thrombocytosis which may be related to volume depletion.  Current platelet count 898,000 Appetite remains poor and patient does appear to be significantly depressed therefore we will begin administration of IV fluids-also orthostatic today   Aspiration pneumonia  of both lower lobes due  to vomit ([place])- (present on admission) Resolved Stable on room air  right anterior knee wound Continue local wound care  Acute posthemorrhagic anemia possiblly from ?psoas hematoma Stool fob + on 1/21 , suspected from nonischemic colitis Has been given a total of 2 units PRBCs this admission Current hemoglobin 9.3  Severe protein-calorie malnutrition (HCC) Nutrition Status: Nutrition Problem: Severe Malnutrition Etiology: acute illness (cardiac arrest, AKI, ileus) Signs/Symptoms: energy intake < or equal to 50% for > or equal to 5 days, percent weight loss (7.4% weight loss in less than 2 weeks) Percent weight loss: 7.4 % Interventions: Ensure Enlive (each supplement provides 350kcal and 20 grams of protein), Hormel Shake, MVI BMI 20.72   Septic shock due to undetermined organism ([place])- (present on admission) Sepsis physiology resolved  Acute metabolic encephalopathy/depression-?  PTSD- (present on admission) Resolved but patient appears very depressed Able to communicate with simple responses to translators I do not suspect brain injury Appears to be depressed -we will add SSRI Patient separated from wife and unclear timing of this in relation to events preceding hospitalization  Elevated liver enzymes Resolved Does have history of substance abuse including alcohol The abdomen without evidence of cirrhosis or hepatomegaly  Mechanical fall 1/24 and hit his head No acute injuries neurological function remained stable  Thrombocytosis Reactive or from infection  Hyponatremia Improving.  Likely 2/2 aki  Acute respiratory failure with hypoxia and hypercapnia ([place]) Patient intubated on 05/24/2021 and extubated on 05/28/2021 .  patient completed course of antibiotics for aspiration pneumonia  2/3 currently on room air        Subjective:  Patient calm.  Complaining of constipation, right knee pain and ankle pain  Physical Exam: Vitals:   06/24/21 0202 06/24/21  0437 06/24/21 0552 06/24/21 0936  BP: 120/90 (!) 132/95 (!) 127/96 (!) 125/95  Pulse: (!) 116 (!) 116 (!) 114 (!) 109  Resp: _0 Temp: 99.2 F (37.3 C) 99.3 F (37.4 C) 98.6 F (37 C)   TempSrc:   Oral   SpO2: 100% 100% 100% 100%  Weight:   69.3 kg   Height:       General:  Pleasantly resting in bed, NAD Lungs:  Clear to auscultate bilaterally -room air-normal work of breathing Heart:  Regular rate and rhythm.  Without murmurs, rubs, or gallops. Abdomen:  Soft, nontender, nondistended.  Without guarding or rebound.  Hypoactive bowel sounds Vascular:  Dorsalis pedis and posterior tibial pulses palpable on right but unable to palpate same pulses on left.  Note nurses have been Doppler in these pulses Neurological: Cranial nerves II through XII grossly intact, moves all extremities x4.  Overall strength diminished at 4/5.  Unable to move left lower extremity secondary to pain Skin: Right lower extremity anterior wound from previous IO placement  Data Reviewed:  All results have been reviewed and noted within the progress note documentation  Family Communication:  Patient only utilizing Spanish language translator  Disposition: Remains inpatient appropriate because:  Unsafe discharge plan   Planned Discharge Destination:  Barriers to discharge: Patient non-English-speaking and does not have payer source for rehabilitative therapies such as SNF and currently is not able to independently mobilize   A physical therapy consult is indicated based on the patients mobility assessment.   Mobility Assessment (last 72 hours)     Mobility Assessment     Row Name 06/23/21 2045 06/22/21 2300 06/22/21 1804   Does patient have an order for  bedrest or is patient medically unstable No - Continue assessment No - Continue assessment --   What is the highest level of mobility based on the progressive mobility assessment? Level 3 (Stands with assist) - Balance while standing  and  cannot march in place Level 3 (Stands with assist) - Balance while standing  and cannot march in place Level 3 (Stands with assist) - Balance while standing  and cannot march in place   Is the above level different from baseline mobility prior to current illness? Yes - Recommend PT order Yes - Recommend PT order --    [place] Name 06/22/21 1750 06/22/21 1723 06/22/21 0847   Does patient have an order for bedrest or is patient medically unstable -- -- No - Continue assessment   What is the highest level of mobility based on the progressive mobility assessment? Level 3 (Stands with assist) - Balance while standing  and cannot march in place  with stedy Level 3 (Stands with assist) - Balance while standing  and cannot march in place  with sara stedy Level 2 ([place]) - Balance while sitting on edge of bed and cannot stand   Is the above level different from baseline mobility prior to current illness? -- -- Yes - Recommend PT order           Medically stable Yes  COVID vaccination status:  Unknown  Consultants: Nephrology Neurology General surgery Orthopedic Infectious disease Procedures: Echocardiogram EEG Core track Temporary dialysis catheter Laparoscopic evaluation of abdomen in context of evaluation for ischemic bowel Antibiotics: Ceftriaxone 1/15 through 1/19 Ceftriaxone 1/21 through 1/23 Flagyl 1/21 through 1/23 Zosyn 1/24 through 1/26 Ancef 1/27 through 1/31 Vancomycin x1 dose on 2/1 Daptomycin 2/3 >> Cefepime 2/4 >>      Time spent: 45 minutes  Author: Erin Hearing, NP 06/24/2021 12:58 PM  For on call review [email or web address].

## 2021-06-24 NOTE — Consult Note (Addendum)
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   He is spanish specking and the interpreter was used via IPAD.  He states he hurt his knee when he feel and doesn't remember falling due to MI event.    He states he has decreased sensation and loss of motor B LE.  The patella area has evidence of dry gangrene in appearence.    He states that prior to his MI he was walking with no issues.   Current Facility-Administered Medications  Medication Dose Route Frequency Provider Last Rate Last Admin   (feeding supplement) PROSource Plus liquid 30 mL  30 mL Oral TID BM Azucena Fallen, MD   30 mL at 06/24/21 1105   0.9 %  sodium chloride infusion  250 mL Intravenous Continuous Emelia Loron, MD 10 mL/hr at 06/22/21 2157 250 mL at 06/22/21 2157   acetaminophen (TYLENOL) tablet 650 mg  650 mg Oral Q6H PRN Eduard Clos, MD   650 mg at 06/22/21 2317   albuterol (PROVENTIL) (2.5 MG/3ML) 0.083% nebulizer solution 2.5 mg  2.5 mg Nebulization Q2H PRN Emelia Loron, MD   2.5 mg at 05/24/21 1143   amLODipine (NORVASC) tablet 5 mg  5 mg Oral Daily Estanislado Emms, MD   5 mg at 06/24/21 1105   amoxicillin-clavulanate (AUGMENTIN) 875-125 MG per tablet 1 tablet  1 tablet Oral Q12H Kathlynn Grate, DO       calcium acetate (PHOSLO) capsule 1,334 mg  1,334 mg Oral TID WC Annie Sable, MD   1,334 mg at 06/24/21 1233   collagenase (SANTYL) ointment   Topical Daily Azucena Fallen, MD   Given at 06/24/21 1106   dextrose 5 %-0.9 % sodium chloride infusion   Intravenous Continuous Russella Dar, NP       docusate sodium (COLACE) capsule 100 mg  100 mg Oral BID Russella Dar, NP       doxycycline (VIBRA-TABS) tablet 100 mg  100 mg Oral Q12H Kathlynn Grate, DO        feeding supplement (ENSURE ENLIVE / ENSURE PLUS) liquid 237 mL  237 mL Oral BID BM Azucena Fallen, MD   237 mL at 06/24/21 1108   folic acid (FOLVITE) tablet 1 mg  1 mg Per Tube Daily Emelia Loron, MD   1 mg at 06/24/21 1106   Gerhardt's butt cream   Topical TID Swayze, Ava, DO   Given at 06/23/21 2059   guaiFENesin-dextromethorphan (ROBITUSSIN DM) 100-10 MG/5ML syrup 5 mL  5 mL Oral Q4H PRN Olalere, Adewale A, MD       heparin injection 5,000 Units  5,000 Units Subcutaneous Veryl Speak, MD   5,000 Units at 06/24/21 2023   hydrALAZINE (APRESOLINE) injection 10 mg  10 mg Intravenous Q6H PRN Emelia Loron, MD       lidocaine (LIDODERM) 5 % 1 patch  1 patch Transdermal Q24H Russella Dar, NP       multivitamin (RENA-VIT) tablet 1 tablet  1 tablet Oral QHS Swayze, Ava, DO   1 tablet at 06/23/21 2100   oxyCODONE (Oxy IR/ROXICODONE) immediate release tablet 5-10 mg  5-10 mg Oral Q4H PRN Jacinto Halim, PA-C   10 mg at 06/24/21 0358   pantoprazole (PROTONIX)  EC tablet 40 mg  40 mg Oral BID Hosie Poisson, MD   40 mg at 06/24/21 1105   polyethylene glycol (MIRALAX / GLYCOLAX) packet 17 g  17 g Oral Daily Samella Parr, NP       pregabalin (LYRICA) capsule 25 mg  25 mg Oral TID Samella Parr, NP   25 mg at 06/24/21 1107   sertraline (ZOLOFT) tablet 25 mg  25 mg Oral QHS Samella Parr, NP        Pt meds include: Statin :No Betablocker: No ASA: No Other anticoagulants/antiplatelets:  History reviewed. No pertinent past medical history.  Past Surgical History:  Procedure Laterality Date   INSERTION OF DIALYSIS CATHETER Right 06/01/2021   Procedure: INSERTION OF DIALYSIS CATHETER;  Surgeon: Candee Furbish, MD;  Location: [facility] ENDOSCOPY;  Service: Pulmonary;  Laterality: Right;   LAPAROSCOPY N/A 06/02/2021   Procedure: LAPAROSCOPY DIAGNOSTIC;  Surgeon: Rolm Bookbinder, MD;  Location: [place];  Service: General;  Laterality: N/A;    Social  History Social History   Tobacco Use   Smoking status: Former    Types: Cigarettes   Smokeless tobacco: Former   Tobacco comments:    This is an Urgent case - to OR  Substance Use Topics   Alcohol use: Yes   Drug use: Not Currently    Comment: pt denies    Family History History reviewed. No pertinent family history.  No Known Allergies   REVIEW OF SYSTEMS  General: [ ]  Weight loss, [ ]  Fever, [ ]  chills Neurologic: [ ]  Dizziness, [ ]  Blackouts, [ ]  Seizure [ ]  Stroke, [ ]  "Mini stroke", [ ]  Slurred speech, [ ]  Temporary blindness; [ ]  weakness in arms or legs, [ ]  Hoarseness [ ]  Dysphagia Cardiac: [ ]  Chest pain/pressure, [ ]  Shortness of breath at rest [ ]  Shortness of breath with exertion, [ ]  Atrial fibrillation or irregular heartbeat  Vascular: [ ]  Pain in legs with walking, [ ]  Pain in legs at rest, [ ]  Pain in legs at night,  [ ]  Non-healing ulcer, [ ]  Blood clot in vein/DVT,   Pulmonary: [ ]  Home oxygen, [ ]  Productive cough, [ ]  Coughing up blood, [ ]  Asthma,  [ ]  Wheezing [ ]  COPD Musculoskeletal:  [ ]  Arthritis, [ ]  Low back pain, [ ]  Joint pain Hematologic: [ ]  Easy Bruising, [ ]  Anemia; [ ]  Hepatitis Gastrointestinal: [ ]  Blood in stool, [ ]  Gastroesophageal Reflux/heartburn, Urinary: [ ]  chronic Kidney disease, [ ]  on HD - [ ]  MWF or [ ]  TTHS, [ ]  Burning with urination, [ ]  Difficulty urinating Skin: [ ]  Rashes, [ ]  Wounds Psychological: [ ]  Anxiety, [ ]  Depression  Physical Examination Vitals:   06/24/21 0202 06/24/21 0437 06/24/21 0552 06/24/21 0936  BP: 120/90 (!) 132/95 (!) 127/96 (!) 125/95  Pulse: (!) 116 (!) 116 (!) 114 (!) 109  Resp: 18 19 18 17   Temp: 99.2 F (37.3 C) 99.3 F (37.4 C) 98.6 F (37 C)   TempSrc:   Oral   SpO2: 100% 100% 100% 100%  Weight:   69.3 kg   Height:       Body mass index is 20.72 kg/m.  General:  WDWN in NAD  HENT: WNL Eyes: Pupils equal Pulmonary: normal non-labored breathing , without Rales, rhonchi,   wheezing Cardiac: RRR, without  Murmurs, rubs or gallops; No carotid bruits Abdomen: soft, NT, no masses Skin: no rashes, ulcers noted;  positive Gangrene ,  no cellulitis; no open wounds;    Vascular Exam/Pulses:palpable femoral pulses B, left tender to palpation Palpable right PT and left DP pulses.  No ischemic changes to the feet or toes   Musculoskeletal: no muscle wasting or atrophy; no edema  Neurologic: A&O X 3; Appropriate Affect ;  Decreased SENSATION B LE MOTOR FUNCTION: Loss of motor B LE Speech is fluent/normal   Significant Diagnostic Studies: CBC Lab Results  Component Value Date   WBC 10.7 (H) 06/24/2021   HGB 9.3 (L) 06/24/2021   HCT 30.4 (L) 06/24/2021   MCV 84.4 06/24/2021   PLT 898 (H) 06/24/2021    BMET    Component Value Date/Time   NA 133 (L) 06/24/2021 0724   K 3.7 06/24/2021 0724   CL 102 06/24/2021 0724   CO2 19 (L) 06/24/2021 0724   GLUCOSE 99 06/24/2021 0724   BUN 40 (H) 06/24/2021 0724   CREATININE 1.64 (H) 06/24/2021 0724   CALCIUM 9.5 06/24/2021 0724   GFRNONAA 52 (L) 06/24/2021 0724   Estimated Creatinine Clearance: 55.8 mL/min (A) (by C-G formula based on SCr of 1.64 mg/dL (H)).  COAG Lab Results  Component Value Date   INR 1.2 05/24/2021     Non-Invasive Vascular Imaging:   noted.      ABI Findings:  +---------+------------------+-----+---------+------------+   Right     Rt Pressure (mmHg) Index Waveform  Comment        +---------+------------------+-----+---------+------------+   Brachial                           triphasic                +---------+------------------+-----+---------+------------+   PTA       120                1.03  biphasic                 +---------+------------------+-----+---------+------------+   DP                                           not audbible   +---------+------------------+-----+---------+------------+   Great Toe                          Absent                    +---------+------------------+-----+---------+------------+   +---------+------------------+-----+---------+-----------+   Left      Lt Pressure (mmHg) Index Waveform  Comment       +---------+------------------+-----+---------+-----------+   Brachial  116                      triphasic               +---------+------------------+-----+---------+-----------+   PTA       87                 0.75  biphasic                +---------+------------------+-----+---------+-----------+   DP                                           not audible   +---------+------------------+-----+---------+-----------+  Great Toe                          Absent                  +---------+------------------+-----+---------+-----------+   +-------+-----------+-----------+------------+------------+   ABI/TBI Today's ABI Today's TBI Previous ABI Previous TBI   +-------+-----------+-----------+------------+------------+   Right   1.03                                                +-------+-----------+-----------+------------+------------+   Left    0.75                                                +-------+-----------+-----------+------------+------------+        Summary:  Right: Resting right ankle-brachial index is within normal range. No  evidence of significant right lower extremity arterial disease. The right  toe-brachial index is abnormal.   Left: Resting left ankle-brachial index indicates moderate left lower  extremity arterial disease. The left toe-brachial index is abnormal.   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  The ABI's suggest loss of TBI B. No Vascular intervention is indicated currently. Would continue wound care to right knee     Roxy Horseman 06/24/2021 3:22 PM   I agree with the above.  I have seen and evaluated the patient.  He has palpable pedal pulses which suggest adequate perfusion to  heal his wound.  Orthopedics is following the wound.  There were no acute vascular surgical needs.  Please contact us if there are any other concerns.  We will not actively follow the patient.  Annamarie Major

## 2021-06-25 DIAGNOSIS — F4321 Adjustment disorder with depressed mood: Secondary | ICD-10-CM

## 2021-06-25 DIAGNOSIS — R262 Difficulty in walking, not elsewhere classified: Secondary | ICD-10-CM

## 2021-06-25 LAB — DIC (DISSEMINATED INTRAVASCULAR COAGULATION)PANEL
D-Dimer, Quant: 3.5 ug/mL-FEU — ABNORMAL HIGH (ref 0.00–0.50)
Fibrinogen: 648 mg/dL — ABNORMAL HIGH (ref 210–475)
INR: 1.2 (ref 0.8–1.2)
Platelets: 780 10*3/uL — ABNORMAL HIGH (ref 150–400)
Prothrombin Time: 14.9 seconds (ref 11.4–15.2)
Smear Review: NONE SEEN
aPTT: 31 seconds (ref 24–36)

## 2021-06-25 LAB — BASIC METABOLIC PANEL
Anion gap: 10 (ref 5–15)
BUN: 36 mg/dL — ABNORMAL HIGH (ref 6–20)
CO2: 18 mmol/L — ABNORMAL LOW (ref 22–32)
Calcium: 9.3 mg/dL (ref 8.9–10.3)
Chloride: 105 mmol/L (ref 98–111)
Creatinine, Ser: 1.45 mg/dL — ABNORMAL HIGH (ref 0.61–1.24)
GFR, Estimated: >60 mL/min (ref >60)
Glucose, Bld: 158 mg/dL — ABNORMAL HIGH (ref 70–99)
Potassium: 3.6 mmol/L (ref 3.5–5.1)
Sodium: 133 mmol/L — ABNORMAL LOW (ref 135–145)

## 2021-06-25 LAB — URINALYSIS, ROUTINE W REFLEX MICROSCOPIC
Bilirubin Urine: NEGATIVE
Glucose, UA: 50 mg/dL — AB
Ketones, ur: NEGATIVE mg/dL
Leukocytes,Ua: NEGATIVE
Nitrite: NEGATIVE
Protein, ur: 100 mg/dL — AB
Specific Gravity, Urine: 1.012 (ref 1.005–1.030)
pH: 6 (ref 5.0–8.0)

## 2021-06-25 LAB — C-REACTIVE PROTEIN: CRP: 4.6 mg/dL — ABNORMAL HIGH (ref <1.0)

## 2021-06-25 LAB — CK: Total CK: 3182 U/L — ABNORMAL HIGH (ref 49–397)

## 2021-06-25 LAB — SEDIMENTATION RATE: Sed Rate: 128 mm/hr — ABNORMAL HIGH (ref 0–16)

## 2021-06-25 MED ORDER — METHOCARBAMOL 500 MG PO TABS
500.0000 mg | ORAL_TABLET | Freq: Two times a day (BID) | ORAL | Status: DC
  Administered 2021-06-25 – 2021-06-30 (×11): 500 mg via ORAL
  Filled 2021-06-25 (×11): qty 1

## 2021-06-25 MED ORDER — CHLORHEXIDINE GLUCONATE CLOTH 2 % EX PADS
6.0000 | MEDICATED_PAD | Freq: Every day | CUTANEOUS | Status: DC
  Administered 2021-06-25 – 2021-06-28 (×4): 6 via TOPICAL

## 2021-06-25 NOTE — Progress Notes (Signed)
CSW and RN CM spoke with patient at bedside to discuss discharge planning. Patient states he has three steps to enter his home and that there is no ramp. Patient understands the need for a ramp to be able to enter and exit his home in a wheelchair safely. Patient states he resides with his boss. Patient states he will discuss the need for a ramp with his boss and request one be built to accommodate his needs. Patient did not have questions but was encouraged to reach out if needs arise.  Patient currently on IV antibiotics and is unable to safely transfer to the wheelchair from his bed. PT aware that patient will return home at discharge, not to a facility.  Edwin Dada, MSW, LCSW Transitions of Care   Clinical Social Worker II [phone]

## 2021-06-25 NOTE — Plan of Care (Signed)
  Problem: Education: Goal: Knowledge of General Education information will improve Description: Including pain rating scale, medication(s)/side effects and non-pharmacologic comfort measures Outcome: Progressing   Problem: Clinical Measurements: Goal: Ability to maintain clinical measurements within normal limits will improve Outcome: Progressing Goal: Respiratory complications will improve Outcome: Progressing   Problem: Activity: Goal: Risk for activity intolerance will decrease Outcome: Progressing   Problem: Nutrition: Goal: Adequate nutrition will be maintained Outcome: Progressing   Problem: Pain Managment: Goal: General experience of comfort will improve Outcome: Progressing   

## 2021-06-25 NOTE — Assessment & Plan Note (Addendum)
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  Confirmed by MRI of both thighs.  CK is subsequently renormalized with IV fluids and utilization of pulse dose prednisone.  Redeveloped elevated CK with associated myositis that responded to fluids and steroids. 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  Patient asking to be discharged home as of 2/27  2/27 anticipate discharge home 2/28.  Therapy will work with patient regarding home skills such as standing at counter to place food in the microwave oven.   A physical therapy consult is indicated based on the patients mobility assessment.   Mobility Assessment (last 72 hours)    Mobility Assessment    Row Name 07/06/21 2008 07/06/21 1300 07/06/21 1100 07/05/21 2351 07/05/21 2251   Does patient have an order for bedrest or is patient medically unstable No - Continue assessment -- -- No - Continue assessment No - Continue assessment   What is the highest level of mobility based on the progressive mobility assessment? Level 4 (Walks with assist in room) - Balance while marching in place and cannot step forward and back - Complete Level 4 (Walks with assist in room) - Balance while marching in place and cannot step forward and back - Complete Level 5 (Walks with assist in room/hall) - Balance while stepping forward/back and can walk in room with assist - Complete Level 5 (Walks with assist in room/hall) -  Balance while stepping forward/back and can walk in room with assist - Complete Level 5 (Walks with assist in room/hall) - Balance while stepping forward/back and can walk in room with assist - Complete   Is the above level different from baseline mobility prior to current illness? Yes - Recommend PT order -- -- No - Consider discontinuing PT/OT No - Consider discontinuing PT/OT   Row Name 07/05/21 0800 07/04/21 2030         Does patient have an order for bedrest or is patient medically unstable No - Continue assessment No - Continue assessment      What is the highest level of mobility based on the progressive mobility assessment? Level 5 (Walks with assist in room/hall) - Balance while stepping forward/back and can walk in room with assist - Complete Level 5 (Walks with assist in room/hall) - Balance while stepping forward/back and can walk in room with assist - Complete      Is the above level different from baseline mobility prior to current illness? Yes - Recommend PT order No - Consider discontinuing PT/OT

## 2021-06-25 NOTE — TOC Progression Note (Addendum)
Transition of Care [facility]) - Progression Note    Patient Details  Name: Marshall Kampf MRN: [id number] Date of Birth: 1975-05-13  Transition of Care [facility]) CM/SW [place], RN Phone Number: 06/25/2021, 10:34 AM  Clinical Narrative:    CM and MSW with DTP met with the patient at the bedside with Laverda Sorenson, Surveyor, quantity for spanish interpretation at the bedside to discuss transitions of care to home - likely next week.  SNF facility is not an option for this patient since he has no payor source and history of ETOH abuse - no bed offers are available at this time.  Pete Pelt, MSW Supervisor is aware and patient's discharge plan will be to return home with the help of his boss, [name].  The patient states that his boss, who owns the home that he lives in can assist the patient by building a ramp or purchasing a ramp from [employer] by next week. The ramp has 3 steps to enter the home.  The patient states that he will call his boss today and arrange building or providing the wheelchair ramp.  The patient is aware that the patient's family will need to provide transportation to go home by car.  The patient is in agreement.  The patient will need dme provided for home including wheelchair/ cushion, 3:1 and wheelchair ramp (family to build).   [facility] called and follow up appointment was scheduled for July 15, 2021 at 9:30 am - noted in the discharge instructions.  CM called and spoke with Corene Cornea, [place] at [place] to request charity home health - [facility] will follow up - if unable to provide then the patient will be set up for Outpatient PT at [facility].  I spoke with Derrick Ravel, in [place] rehabilitation department and the department does not have charity wheelchair to donate to the patient for use.  If nursing department does not have available wheelchair in the lost-and found department then the [facility]  Team will provide a charity wheelchair through [place] is aware.  CM called and spoke with Corene Cornea, [place] with [place] and they are unable to provide charity home health at the home.  PT will need to be provided at [place] rehabilitation department.     Expected Discharge Plan: OP Rehab Barriers to Discharge: Continued Medical Work up, Family Issues  Expected Discharge Plan and Services Expected Discharge Plan: OP Rehab In-house Referral: Clinical Social Work, PCP / Psychologist, educational, Interpreting Services Discharge Planning Services: CM Consult, [employer], [place] [facility], Medication Assistance, Follow-up appt scheduled Post Acute Care Choice: IP Rehab Living arrangements for the past 2 months: Single Family Home                 DME Arranged: Wheelchair manual, 3-N-1 (Patient states that his boss will build a ramp on the home by next week - 06/29/2021)                     Social Determinants of Health (SDOH) Interventions    Readmission Risk Interventions Readmission Risk Prevention Plan 06/25/2021  Transportation Screening Complete  PCP or Specialist Appt within 5-7 Days Complete  Home Care Screening Complete  Medication Review (RN CM) Complete

## 2021-06-25 NOTE — Progress Notes (Addendum)
Progress Note   Patient: Andres Braun MEQ:[id number] DOB: June 15, 1975 DOA: 05/24/2021     33 DOS: the patient was seen and examined on 06/26/2021   Brief hospital course: 46 year old male admitted to ICU 1/15 under PCCM care after cardiac arrest at home.  PEA arrest, s/p CPR x20 minutes and epi x5.  Patient reportedly out drinking with his friends the night prior to admission.  Found by friends with agonal breathing.  Intubated on arrival, needed vasopressors, noted to have renal failure, rhabdomyolysis with CK in the 14 K range, treated for aspiration pneumonia, nephrology consulted and started on CRRT.  Extubated 1/19 and off pressors.  Mental status slowly improved.  Transferred to telemetry.  Care transferred to [place] on 1/24.  Nephrology following for HD needs.  General surgery on board for questionable small bowel ischemia and taken emergently to surgery 1/24. The bowel in question was found to be thickened, but viable. Hospital course complicated by unexplained left lower extremity weakness. Extensive work up of the neuraxis is negative for compressive pathology. Differential is probably a psoas hematoma causing lumbar plexus compression/ irritation leading to secondary weakness. IR suggested that it is not amenable for aspiration or drain placement. Neurology consulted, suggested not a neurological injury as the neuraxis is negative.    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 bid for muscle stiffness/hypertonicity Increase Lyrica for neuropathic as well as overall pain Change Oxy IR to 7.5 mg scheduled every 4 hours Continue PT and OT as tolerated-hopeful can begin [facility] accelerated therapy program on Monday 2/20 Please note that patient's rhabdomyolysis has reemerged with CK up to 3182 as of 2/16 and today 2/17 is down to 2182 ESR 128 on 2/16 and and today 2/17 is slightly elevated at 139 Urine myoglobin pending although urinalysis abnormal increased red cells Possibly related to recent administration of daptomycin noting that on 2/12 last CK was normal at 95 Patient has persistent thrombocytosis that has actually worsened with IV fluid administration.  Discussed with attending and have asked Dr. Marin Olp to see for hematology consultation Likely will need to involve the rheumatologist.  No inpatient rheumatologist available so we will discuss over telephone with outpatient rheumatologist. Begin myositis work-up.  GGT slightly elevated so we will also add serum aldolase, will also check ANA with reflex, Sjogren syndrome a extractable nuclear antibody, anti-Jo1 antibody/IgG D-dimer elevated in the 3.5 range and this is likely from acute rhabdomyolosis.  AST slightly elevated.  Lower extremity venous duplex preliminary negative for DVT Check MRI of femurs regarding possible inflammatory myositis  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HCC) Bcx negative Cxr possible atelectasis See additional documentation under PVD and recent rhabdomyolysis ID has changed anbxs to PO doxy/augmentin. LD anbxs 3/3/2-23   Peripheral vascular disease of LEFT lower extremity w/ foot drop MRI of the neuraxis  was unremarkable for acute process . As per the MRI of the lumbar spine, multiple fluid collections in the right ileo psoas muscle.  Dr Benny Lennert spoke with neurology and neuro surgery on 1/24, no surgical cause for his lower  extremity weakness. He suggested that a psoas abscess or hematoma  could cause lumbar plexus compression /irritation leading to secondary weakness.   IR consulted for aspiration, suggested any aspiration would predispose to infection/ not amenable for aspiration or drain placement. Did not advise aspiration.  MRI of the femur c/w rhabdomyolysis, with the differential including myositis related to connective tissue disease and polymyositis. No drainable abscess identified. pt unable to move the left lower extremity from severe pain and weakness.  Orthopedics consulted, suggested its a neurological/ lumbar plexus ischemic injury.  Hoping for recovery with therapy evaluations.  Vitamin b12 and folate levels wnl. 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 The left toe-brachial index is abnormal.  Vascular evaluated on 2/15 and no acute vascular needs related to abnormal ABIs      Ischemic enteritis versus colitis with ileus General surgery consulted, diagnostic laparoscopy unremarkable  C. difficile PCR and GI panel negative    AKI (acute kidney injury)/dehydration secondary to persistent poor oral intake- (present on admission) with metabolic acidosis/rhabdomyolysis Acquired CRRT 1/15-1/19/2023. Nephrology had been following but renal function has recovered Current creatinine 1.64 with BUN 40 and GFR 52 Currently in a negative balance of 28 L Patient continues to have thrombocytosis which may be related to volume depletion.  Current platelet count 898,000 Continue IV fluids since appears to be volume depleted.  Patient reports he is drinking plenty of fluids. Through translator was able to determine that patient does not like the foods that have been sent to his room.  Soft diet changed to regular and translator obtained Spanish language diet and assisted patient in making choices regarding his food versus eating what was picked out for  him.   Aspiration pneumonia of both lower lobes due to vomit ([place])- (present on admission) Resolved Stable on room air  R anterior knee wound after placement IO access  Abn ABIs/arterial flow contributing to slow healing Continue local wound care Superficial wound/eschar continues to degrade and expect will slough off per recommendations of vascular services and infectious disease team  Acute posthemorrhagic anemia possiblly from ?psoas hematoma Stool fob + on 1/21 , suspected from nonischemic colitis Has been given a total of 2 units PRBCs this admission Current hemoglobin 9.3  Severe protein-calorie malnutrition (HCC) Nutrition Status: Nutrition Problem: Severe Malnutrition Etiology: acute illness (cardiac arrest, AKI, ileus) Signs/Symptoms: energy intake < or equal to 50% for > or equal to 5 days, percent weight loss (7.4% weight loss in less than 2 weeks) Percent weight loss: 7.4 % Interventions: Ensure Enlive (each supplement provides 350kcal and 20 grams of protein), Hormel Shake, MVI BMI 20.72 Therapist who has been taking care of patient for significant portion of hospitalization states patient appears to have lost weight.  If weights are accurate patient weighed 206 pounds on 1/15, on 2/15 weight 152.8 pound Continue regular diet along with protein supplementation and allow snacks  Septic shock due to undetermined organism ([place])- (present on admission) Sepsis physiology resolved  Cardiac arrest with pulseless electrical activity ([place]) S/p PEA arrest, s/p CPR x 20 minutes and Epi x 5. Echocardiogram 1/15 with preserved LV function Not on a statin  Elevated liver enzymes Mild transaminitis has context of reemergence of mild rhabdomyollosis Of note CT this admission shows no evidence of cirrhosis  Situational depression Patient experiencing depression prior to admission due to dissolution of his marriage Further worsening of depression symptoms based on current  physical  debility and lack of clarity on recovery Zoloft initiated on 2/15  Thrombocytosis Reactive or from infection  Hyponatremia Improving.  Likely 2/2 aki  Acute respiratory failure with hypoxia and hypercapnia ([place]) Patient intubated on 05/24/2021 and extubated on 05/28/2021 .  patient completed course of antibiotics for aspiration pneumonia  2/3 currently on room air        Subjective:  Communicated via in person Spanish language interpreter.  Patient's primary reason for not eating is that he hurts too badly.  Describes significant leg pain.  Therapy at bedside and states patient appears to her to have 1) lost a significant amount of weight over the past week and a half and 2) definite noticed decrease in strength and sensation in RLE where LLE was primary issue previously  Physical Exam: Vitals:   06/25/21 2042 06/26/21 0034 06/26/21 0432 06/26/21 0912  BP: (!) 138/98 (!) 137/98 (!) 141/101 (!) 144/102  Pulse: (!) 118 (!) 111 (!) 109 (!) 109  Resp: 18 17 18 18   Temp: 98.2 F (36.8 C) 98.7 F (37.1 C) 98.2 F (36.8 C) 98.2 F (36.8 C)  TempSrc: Oral Oral Oral Oral  SpO2: 100% 100% 100% 100%  Weight:      Height:       General:  Pleasantly resting in bed, NAD Lungs:  Clear to auscultate bilaterally -room air-normal work of breathing Heart:  Regular rate and rhythm.  Without murmurs, rubs, or gallops. Abdomen:  Soft, nontender, nondistended.  Hypoactive bowel sounds LBM 2/15 Vascular: Weak DP/pedal pulses bilaterally. Neurological: Cranial nerves II through XII grossly intact, moves all extremities x4.  Upper extremity strength 4-5/5.  Essentially unable to move lower extremities or lift either leg off the bed.  ROM applied to both legs by bending knees revealed reports of pain primarily at the anterior thigh level as well as hip flexor pain.  Used to report generalized numbness of lower extremities which is worse on the plantar surfaces of the feet. Skin: Right lower  extremity anterior wound from previous IO placement  Data Reviewed:  All results have been reviewed and noted within the progress note documentation  Family Communication:  Patient only utilizing Spanish language translator  Disposition: Remains inpatient appropriate because:  Unsafe discharge plan Barriers to discharge: Patient will need to discharge to the home environment and based on current PT evaluation is not quite ready to discharge and maintain independence in a home setting.  He will not have 24/7 assistance once discharged.  Currently working on wheelchair transfers and other home-based activities.  His training has been initiated on 2/16   Planned Discharge Destination:  Barriers to discharge: Patient non-English-speaking and does not have payer source for rehabilitative therapies such as SNF and currently is not able to independently mobilize   A physical therapy consult is indicated based on the patients mobility assessment.   Mobility Assessment (last 72 hours)     Mobility Assessment     Row Name 06/26/21 1000 06/25/21 2049 06/25/21 1214   Does patient have an order for bedrest or is patient medically unstable -- No - Continue assessment --   What is the highest level of mobility based on the progressive mobility assessment? Level 1 (Bedfast) - Unable to balance while sitting on edge of bed Level 2 (Chairfast) - Balance while sitting on edge of bed and cannot stand Level 2 (Chairfast) - Balance while sitting on edge of bed and cannot stand   Is the above level different from baseline mobility  prior to current illness? -- Yes - Recommend PT order --    [place] Name 06/25/21 1100 06/25/21 0800 06/24/21 1947   Does patient have an order for bedrest or is patient medically unstable No - Continue assessment No - Continue assessment No - Continue assessment   What is the highest level of mobility based on the progressive mobility assessment? Level 2 (Chairfast) - Balance  while sitting on edge of bed and cannot stand Level 2 (Chairfast) - Balance while sitting on edge of bed and cannot stand Level 2 (Chairfast) - Balance while sitting on edge of bed and cannot stand   Is the above level different from baseline mobility prior to current illness? Yes - Recommend PT order Yes - Recommend PT order Yes - Recommend PT order    [place] Name 06/24/21 1900 06/23/21 2045     Does patient have an order for bedrest or is patient medically unstable No - Continue assessment No - Continue assessment    What is the highest level of mobility based on the progressive mobility assessment? Level 3 (Stands with assist) - Balance while standing  and cannot march in place Level 3 (Stands with assist) - Balance while standing  and cannot march in place    Is the above level different from baseline mobility prior to current illness? Yes - Recommend PT order Yes - Recommend PT order            Medically stable Yes  COVID vaccination status:  Unknown  Consultants: Nephrology Neurology General surgery Orthopedic Infectious disease Procedures: Echocardiogram EEG Core track Temporary dialysis catheter Laparoscopic evaluation of abdomen in context of evaluation for ischemic bowel Antibiotics: Ceftriaxone 1/15 through 1/19 Ceftriaxone 1/21 through 1/23 Flagyl 1/21 through 1/23 Zosyn 1/24 through 1/26 Ancef 1/27 through 1/31 Vancomycin x1 dose on 2/1 Daptomycin 2/3 through 2/15 Cefepime 2/4 through 2/15 Doxycycline and Augmentin 2/15 with last doses due 3/3      Time spent: 25 minutes  Author: Erin Hearing, NP 06/26/2021 1:18 PM  For on call review [email or web address].

## 2021-06-25 NOTE — Progress Notes (Signed)
Physical Therapy Treatment Patient Details Name: Andres Braun MRN: [id number] DOB: June 04, 1975 Today's Date: 06/25/2021   History of Present Illness Pt is a 46 y.o. male admitted 05/24/21 after cardiac arrest at home; pt with agonal breathing, lost pulse, required 20-min CPR and 5 rounds epi. Workup for acute hypoxic respiratory failure, septic shock due to undertermined organism, AKI, acute metabolic encephalopathy, aspiration PNA of bilateral lower lobes due to vomit. ETT 1/15-1/19. CRRT 1/15-1/20. Brain MRI 1/22 negative. Cervical MRI 1/22 with small R-side disc protrusion C5-6. Abdominal CT showed muscular swelling, particularly iliopsoas and gluteal muscles consistent with rhabdomyolysis; hemorrhage noted within R iliopsoas. S/p diagnostic laparoscopy 1/24. MRI L femur 1/25 showed diffuse muscular abnormalities bilateral thighs with heterogenous T2 signal, especially within L glut max and gradratus femoris; findings compatible with dx of rhabdomyolysis, no drainable abscess identified; these findings could contribute to compartment syndrome. Unknown PMH.    PT Comments    Patient seen for initiation of transfer training in hopes to progress to independence from w/c level. Patient requires minA for the specific task of lateral scoot transfer with sliding board but max cues and assist for w/c management and sliding board placement. Patient requires assistance for w/c mobility in tight spaces to simulate home setup. Attempted to simulate pericare while seated EOB, but patient unable to lateral lean L/R to perform due to pain. Patient demos difficulty problem solving and poor safety awareness in regards to returning home. Patient will need to be independent with all mobility and ADLs from w/c level as patient is unable to stand without +2 assist and boss will not be available majority of the day to assist. Will need w/c and cushion, drop arm BSC, hospital bed, sliding board, and w/c ramp in addition to  24 hour assistance to be able to discharge home safely.     Recommendations for follow up therapy are one component of a multi-disciplinary discharge planning process, led by the attending physician.  Recommendations may be updated based on patient status, additional functional criteria and insurance authorization.  Follow Up Recommendations  Skilled nursing-short term rehab (<3 hours/day) (this is safest d/c plan but due to payor source and hx per case management, SNF and HHPT not an option. Patient will need w/c ramp and w/c transportation to attempt to get to [facility].      Assistance Recommended at Discharge Frequent or constant Supervision/Assistance  Patient can return home with the following A lot of help with walking and/or transfers;Assistance with cooking/housework;Direct supervision/assist for medications management;Direct supervision/assist for financial management;Assist for transportation;Help with stairs or ramp for entrance;A lot of help with bathing/dressing/bathroom   Equipment Recommendations  Wheelchair (measurements PT);Wheelchair cushion (measurements PT);BSC/3in1;Hospital bed (drop arm BSC, hoyer?, sliding board)    Recommendations for Other Services       Precautions / Restrictions Precautions Precautions: Fall;Other (comment) Precaution Comments: symptomatic orthostatic hypotension with standing Restrictions Weight Bearing Restrictions: No     Mobility  Bed Mobility Overal bed mobility: Needs Assistance Bed Mobility: Supine to Sit, Sit to Supine     Supine to sit: Min guard Sit to supine: Mod assist   General bed mobility comments: modA for BLE management to return back to bed. instructed patient to utilize R LE to assist L LE back into bed by hooking R under L but patient stating he is unable to do so without attempting while in bed to simulate maneuver    Transfers Overall transfer level: Needs assistance Equipment used: Sliding board Transfers: Bed to  chair/wheelchair/BSC            Lateral/Scoot Transfers: Min assist, Max assist, With slide board General transfer comment: minA to perform the task of lateral scooting but max cues and assist for w/c management and sliding board placement    Ambulation/Gait                   Psychologist, counselling mobility: Yes Wheelchair propulsion: Both upper extremities Wheelchair parts: Needs assistance Distance: 125 Wheelchair Assistance Details (indicate cue type and reason): cues for locking/unlocking brakes, assist to don/doff leg rests and position BLEs. Assist for tight spaces  Modified Rankin (Stroke Patients Only)       Balance Overall balance assessment: Needs assistance Sitting-balance support: No upper extremity supported, Feet supported Sitting balance-Leahy Scale: Fair                                      Cognition Arousal/Alertness: Awake/alert Behavior During Therapy: Flat affect Overall Cognitive Status: Impaired/Different from baseline Area of Impairment: Following commands, Awareness, Problem solving, Attention, Safety/judgement                       Following Commands: Follows multi-step commands inconsistently Safety/Judgement: Decreased awareness of safety, Decreased awareness of deficits Awareness: Intellectual, Emergent Problem Solving: Requires verbal cues, Requires tactile cues, Decreased initiation General Comments: Not sure if patient fully understands the amount of tasks he will have to complete day to day independently if he were to return home. Continues to have difficulty problem solving        Exercises General Exercises - Lower Extremity Ankle Circles/Pumps: AROM, Right, 5 reps Heel Slides: Right, AROM, 5 reps Hip ABduction/ADduction: AAROM, Both, 5 reps, Supine    General Comments        Pertinent Vitals/Pain Pain Assessment Pain Assessment:  Faces Faces Pain Scale: Hurts even more Pain Location: LLE > RLE with mobility Pain Descriptors / Indicators: Grimacing, Guarding, Numbness Pain Intervention(s): Monitored during session, Repositioned    Home Living                          Prior Function            PT Goals (current goals can now be found in the care plan section) Acute Rehab PT Goals PT Goal Formulation: With patient Time For Goal Achievement: 07/01/21 Potential to Achieve Goals: Fair Progress towards PT goals: Progressing toward goals    Frequency    Min 3X/week (more if possible to progress him)      PT Plan Current plan remains appropriate    Co-evaluation              AM-PAC PT "6 Clicks" Mobility   Outcome Measure  Help needed turning from your back to your side while in a flat bed without using bedrails?: A Little Help needed moving from lying on your back to sitting on the side of a flat bed without using bedrails?: A Little Help needed moving to and from a bed to a chair (including a wheelchair)?: A Lot Help needed standing up from a chair using your arms (e.g., wheelchair or bedside chair)?: Total Help needed to walk in hospital room?: Total Help needed climbing 3-5 steps with a railing? : Total 6  Click Score: 11    End of Session   Activity Tolerance: Patient tolerated treatment well;Patient limited by pain Patient left: in bed;with call bell/phone within reach;with bed alarm set (for bowel movement as he needs +2 and Stedy for [facility]) Nurse Communication: Mobility status PT Visit Diagnosis: Muscle weakness (generalized) (M62.81);Difficulty in walking, not elsewhere classified (R26.2);Unsteadiness on feet (R26.81)     Time: 0102-7253 PT Time Calculation (min) (ACUTE ONLY): 39 min  Charges:  $Therapeutic Activity: 23-37 mins $Wheel Chair Management: 8-22 mins                     Marnesha Gagen A. Dan Humphreys PT, DPT Acute Rehabilitation Services Pager [phone] Office  [phone]    Viviann Spare 06/25/2021, 12:42 PM

## 2021-06-25 NOTE — Assessment & Plan Note (Signed)
Patient experiencing depression prior to admission due to dissolution of his marriage Further worsening of depression symptoms based on current physical debility and lack of clarity on recovery Zoloft initiated on 2/15

## 2021-06-26 ENCOUNTER — Inpatient Hospital Stay (HOSPITAL_COMMUNITY): Payer: Self-pay

## 2021-06-26 ENCOUNTER — Encounter (HOSPITAL_COMMUNITY): Payer: Self-pay | Admitting: Pulmonary Disease

## 2021-06-26 DIAGNOSIS — F1090 Alcohol use, unspecified, uncomplicated: Secondary | ICD-10-CM

## 2021-06-26 DIAGNOSIS — N289 Disorder of kidney and ureter, unspecified: Secondary | ICD-10-CM

## 2021-06-26 DIAGNOSIS — M79604 Pain in right leg: Secondary | ICD-10-CM

## 2021-06-26 DIAGNOSIS — K529 Noninfective gastroenteritis and colitis, unspecified: Secondary | ICD-10-CM

## 2021-06-26 DIAGNOSIS — I739 Peripheral vascular disease, unspecified: Secondary | ICD-10-CM

## 2021-06-26 DIAGNOSIS — I509 Heart failure, unspecified: Secondary | ICD-10-CM

## 2021-06-26 DIAGNOSIS — F17210 Nicotine dependence, cigarettes, uncomplicated: Secondary | ICD-10-CM

## 2021-06-26 DIAGNOSIS — G7281 Critical illness myopathy: Secondary | ICD-10-CM

## 2021-06-26 DIAGNOSIS — D509 Iron deficiency anemia, unspecified: Secondary | ICD-10-CM

## 2021-06-26 DIAGNOSIS — R531 Weakness: Secondary | ICD-10-CM

## 2021-06-26 DIAGNOSIS — D75839 Thrombocytosis, unspecified: Secondary | ICD-10-CM

## 2021-06-26 DIAGNOSIS — M79605 Pain in left leg: Secondary | ICD-10-CM

## 2021-06-26 LAB — CBC WITH DIFFERENTIAL/PLATELET
Abs Immature Granulocytes: 0.11 10*3/uL — ABNORMAL HIGH (ref 0.00–0.07)
Basophils Absolute: 0.1 10*3/uL (ref 0.0–0.1)
Basophils Relative: 1 %
Eosinophils Absolute: 0.3 10*3/uL (ref 0.0–0.5)
Eosinophils Relative: 3 %
HCT: 27.9 % — ABNORMAL LOW (ref 39.0–52.0)
Hemoglobin: 8.7 g/dL — ABNORMAL LOW (ref 13.0–17.0)
Immature Granulocytes: 1 %
Lymphocytes Relative: 31 %
Lymphs Abs: 3 10*3/uL (ref 0.7–4.0)
MCH: 26.2 pg (ref 26.0–34.0)
MCHC: 31.2 g/dL (ref 30.0–36.0)
MCV: 84 fL (ref 80.0–100.0)
Monocytes Absolute: 0.8 10*3/uL (ref 0.1–1.0)
Monocytes Relative: 8 %
Neutro Abs: 5.5 10*3/uL (ref 1.7–7.7)
Neutrophils Relative %: 56 %
Platelets: 823 10*3/uL — ABNORMAL HIGH (ref 150–400)
RBC: 3.32 MIL/uL — ABNORMAL LOW (ref 4.22–5.81)
RDW: 15.6 % — ABNORMAL HIGH (ref 11.5–15.5)
WBC: 9.8 10*3/uL (ref 4.0–10.5)
nRBC: 0 % (ref 0.0–0.2)

## 2021-06-26 LAB — COMPREHENSIVE METABOLIC PANEL
ALT: 25 U/L (ref 0–44)
AST: 80 U/L — ABNORMAL HIGH (ref 15–41)
Albumin: 2.4 g/dL — ABNORMAL LOW (ref 3.5–5.0)
Alkaline Phosphatase: 161 U/L — ABNORMAL HIGH (ref 38–126)
Anion gap: 9 (ref 5–15)
BUN: 29 mg/dL — ABNORMAL HIGH (ref 6–20)
CO2: 18 mmol/L — ABNORMAL LOW (ref 22–32)
Calcium: 9 mg/dL (ref 8.9–10.3)
Chloride: 107 mmol/L (ref 98–111)
Creatinine, Ser: 1.18 mg/dL (ref 0.61–1.24)
GFR, Estimated: >60 mL/min (ref >60)
Glucose, Bld: 126 mg/dL — ABNORMAL HIGH (ref 70–99)
Potassium: 3.3 mmol/L — ABNORMAL LOW (ref 3.5–5.1)
Sodium: 134 mmol/L — ABNORMAL LOW (ref 135–145)
Total Bilirubin: 0.3 mg/dL (ref 0.3–1.2)
Total Protein: 7.3 g/dL (ref 6.5–8.1)

## 2021-06-26 LAB — D-DIMER, QUANTITATIVE: D-Dimer, Quant: 3.55 ug{FEU}/mL — ABNORMAL HIGH (ref 0.00–0.50)

## 2021-06-26 LAB — GAMMA GT: GGT: 60 U/L — ABNORMAL HIGH (ref 7–50)

## 2021-06-26 LAB — CK: Total CK: 2185 U/L — ABNORMAL HIGH (ref 49–397)

## 2021-06-26 LAB — SEDIMENTATION RATE: Sed Rate: 139 mm/h — ABNORMAL HIGH (ref 0–16)

## 2021-06-26 MED ORDER — COLLAGENASE 250 UNIT/GM EX OINT
TOPICAL_OINTMENT | Freq: Two times a day (BID) | CUTANEOUS | Status: DC
  Filled 2021-06-26: qty 30

## 2021-06-26 MED ORDER — OXYCODONE HCL 5 MG PO TABS
7.5000 mg | ORAL_TABLET | ORAL | Status: DC
  Administered 2021-06-26 – 2021-07-07 (×62): 7.5 mg via ORAL
  Filled 2021-06-26 (×63): qty 2

## 2021-06-26 MED ORDER — SODIUM BICARBONATE 650 MG PO TABS
650.0000 mg | ORAL_TABLET | Freq: Every day | ORAL | Status: DC
Start: 2021-06-26 — End: 2021-07-07
  Administered 2021-06-26 – 2021-07-07 (×12): 650 mg via ORAL
  Filled 2021-06-26 (×12): qty 1

## 2021-06-26 MED ORDER — BOOST / RESOURCE BREEZE PO LIQD CUSTOM
1.0000 | Freq: Three times a day (TID) | ORAL | Status: DC
  Administered 2021-06-26 – 2021-06-29 (×3): 1 via ORAL

## 2021-06-26 MED ORDER — LACTATED RINGERS IV SOLN: INTRAVENOUS | Status: DC

## 2021-06-26 MED ORDER — LORAZEPAM 0.5 MG PO TABS
0.5000 mg | ORAL_TABLET | Freq: Once | ORAL | Status: AC
  Administered 2021-06-26: 0.5 mg via ORAL
  Filled 2021-06-26: qty 1

## 2021-06-26 MED ORDER — PREGABALIN 50 MG PO CAPS
50.0000 mg | ORAL_CAPSULE | Freq: Three times a day (TID) | ORAL | Status: DC
  Administered 2021-06-26 – 2021-07-07 (×32): 50 mg via ORAL
  Filled 2021-06-26 (×32): qty 1

## 2021-06-26 NOTE — Consult Note (Addendum)
[place]  [phone] Fax:(336) [phone]    [place]  Referring MD:  Dr. Nolberto Hanlon  Reason for Referral: Thrombocytosis, anemia  HPI: [name] is a 46 year old male with no significant past medical history.  He presented to the emergency department after cardiac arrest at home.  The patient was reportedly out drinking with friends the night prior to admission.  Friends had to call EMS to his place of residence.  He had agonal breathing and lost his pulse.  Upon arrival to the emergency department, he was severely acidemic with a pH of 6.7.  Labs notable for renal failure, elevated LFTs, and CK in the 14,000 range.  His CBC on admission showed a WBC of 9.6, hemoglobin 15.9, and platelet count of 256,000. His ethanol level was elevated at 44 on admission.  Additionally, urine drug screen was positive for cocaine.  He has been treated for aspiration pneumonia this admission and also received CRRT.  He was also taken to surgery emergently for questionable small bowel ischemia.  However, the bowel in question was found to be thickened but viable.  His hospital course has been complicated by unexplained left lower extremity weakness.  We were asked to see this patient due to his thrombocytosis and anemia.  Labs from today show hemoglobin of 8.7 and platelet count 823,000.  Platelet count from this admission has been reviewed.  His platelets were normal on admission and then dropped to the 90s to 100,000 range.  Over the past 3 weeks, his platelet count has slowly risen and has been maintained in the 500,000-800,000 range.  His admission hemoglobin was actually normal, but hemoglobin has slowly drifted down this admission and currently in the 8-9 range.  He has received 2 units PRBCs this admission-1 unit on 06/09/2021 and another unit on 06/15/2021.  He had iron studies performed on 06/05/2021 and his ferritin was normal at 276, iron was low at 23,  TIBC low at 197, and percent saturation low at 12%.  Folate level was normal on 06/05/2021 at 23.1.  Additionally, vitamin B12 level was checked on 06/05/2021 and was elevated at 2341.  The patient was seen today with the assistance of interpreter.  Overall, he has no specific complaints other than pain to his right leg with dressing changes.  He is not having any fevers, chills, headaches, dizziness, chest pain, shortness of breath, nausea, vomiting.  He denies bleeding such as epistaxis, hematemesis, hematemesis, hematuria, melena, hematochezia.  The patient states that he has never had anemia in the past and has never required a blood transfusion prior to this admission.  Denies history of GI work-up in the past.  The patient is divorced.  He has 2 children.  He smokes cigarettes on occasion.  He reports that he drinks a sixpack of beer on the weekends.  Denies family history of anemia and other blood disorders.  Hematology was asked to see the patient to make recommendations regarding his thrombocytosis and anemia.   History reviewed. No pertinent past medical history.:   Past Surgical History:  Procedure Laterality Date   INSERTION OF DIALYSIS CATHETER Right 06/01/2021   Procedure: INSERTION OF DIALYSIS CATHETER;  Surgeon: Candee Furbish, MD;  Location: [facility] ENDOSCOPY;  Service: Pulmonary;  Laterality: Right;   LAPAROSCOPY N/A 06/02/2021   Procedure: LAPAROSCOPY DIAGNOSTIC;  Surgeon: Rolm Bookbinder, MD;  Location: [place];  Service: General;  Laterality: N/A;  :   CURRENT MEDS: Current Facility-Administered Medications  Medication Dose Route Frequency Provider Last Rate Last Admin   (feeding supplement) PROSource Plus liquid 30 mL  30 mL Oral TID BM Azucena Fallen, MD   30 mL at 06/26/21 0919   0.9 %  sodium chloride infusion  250 mL Intravenous Continuous Emelia Loron, MD 10 mL/hr at 06/25/21 1725 Infusion Verify at 06/25/21 1725   acetaminophen (TYLENOL) tablet 650 mg  650 mg  Oral Q6H PRN Eduard Clos, MD   650 mg at 06/22/21 2317   albuterol (PROVENTIL) (2.5 MG/3ML) 0.083% nebulizer solution 2.5 mg  2.5 mg Nebulization Q2H PRN Emelia Loron, MD   2.5 mg at 05/24/21 1143   amLODipine (NORVASC) tablet 5 mg  5 mg Oral Daily Estanislado Emms, MD   5 mg at 06/26/21 0918   amoxicillin-clavulanate (AUGMENTIN) 875-125 MG per tablet 1 tablet  1 tablet Oral Q12H Kathlynn Grate, DO   1 tablet at 06/26/21 8223   Chlorhexidine Gluconate Cloth 2 % PADS 6 each  6 each Topical Daily Lynn Ito, MD   6 each at 06/26/21 0920   collagenase (SANTYL) ointment   Topical Daily Azucena Fallen, MD   Given at 06/26/21 3169   docusate sodium (COLACE) capsule 100 mg  100 mg Oral BID Russella Dar, NP   100 mg at 06/25/21 0904   doxycycline (VIBRA-TABS) tablet 100 mg  100 mg Oral Q12H Kathlynn Grate, DO   100 mg at 06/26/21 6972   feeding supplement (BOOST / RESOURCE BREEZE) liquid 1 Container  1 Container Oral TID BM Russella Dar, NP   1 Container at 06/26/21 1254   folic acid (FOLVITE) tablet 1 mg  1 mg Per Tube Daily Emelia Loron, MD   1 mg at 06/26/21 9629   Gerhardt's butt cream   Topical TID Swayze, Ava, DO   Given at 06/26/21 0920   guaiFENesin-dextromethorphan (ROBITUSSIN DM) 100-10 MG/5ML syrup 5 mL  5 mL Oral Q4H PRN Olalere, Adewale A, MD       heparin injection 5,000 Units  5,000 Units Subcutaneous Q8H Emelia Loron, MD   5,000 Units at 06/26/21 0617   hydrALAZINE (APRESOLINE) injection 10 mg  10 mg Intravenous Q6H PRN Emelia Loron, MD       lactated ringers infusion   Intravenous Continuous Russella Dar, NP 150 mL/hr at 06/26/21 0932 New Bag at 06/26/21 0932   lidocaine (LIDODERM) 5 % 1 patch  1 patch Transdermal Q24H Russella Dar, NP   1 patch at 06/26/21 1254   methocarbamol (ROBAXIN) tablet 500 mg  500 mg Oral Q12H Russella Dar, NP   500 mg at 06/26/21 1558   multivitamin (RENA-VIT) tablet 1 tablet  1 tablet Oral QHS  Swayze, Ava, DO   1 tablet at 06/25/21 2126   oxyCODONE (Oxy IR/ROXICODONE) immediate release tablet 7.5 mg  7.5 mg Oral Q4H Russella Dar, NP   7.5 mg at 06/26/21 1252   pantoprazole (PROTONIX) EC tablet 40 mg  40 mg Oral BID Kathlen Mody, MD   40 mg at 06/26/21 0919   polyethylene glycol (MIRALAX / GLYCOLAX) packet 17 g  17 g Oral Daily Russella Dar, NP   17 g at 06/25/21 0905   pregabalin (LYRICA) capsule 50 mg  50 mg Oral TID Russella Dar, NP       sertraline (ZOLOFT) tablet 25 mg  25 mg Oral QHS Russella Dar, NP   25 mg at 06/25/21 2126  sodium bicarbonate tablet 650 mg  650 mg Oral Daily Samella Parr, NP   650 mg at 06/26/21 4580    No Known Allergies:  History reviewed. No pertinent family history.:   Social History   Socioeconomic History   Marital status: Unknown    Spouse name: Not on file   Number of children: Not on file   Years of education: Not on file   Highest education level: Not on file  Occupational History   Not on file  Tobacco Use   Smoking status: Former    Types: Cigarettes   Smokeless tobacco: Former   Tobacco comments:    This is an Urgent case - to OR  Vaping Use   Vaping Use: Not on file  Substance and Sexual Activity   Alcohol use: Yes   Drug use: Not Currently    Comment: pt denies   Sexual activity: Not on file  Other Topics Concern   Not on file  Social History Narrative   Pt separated from significant other 5-6 years ago ([place]). She says she is the only contact that he has here though   Social Determinants of Radio broadcast assistant Strain: Not on file  Food Insecurity: Not on file  Transportation Needs: Not on file  Physical Activity: Not on file  Stress: Not on file  Social Connections: Not on file  Intimate Partner Violence: Not on file  :  REVIEW OF SYSTEMS:  A comprehensive 14 point review of systems was negative except as noted in the HPI.    Exam: Patient Vitals for the past 24 hrs:  BP Temp Temp  src Pulse Resp SpO2  06/26/21 0912 (!) 144/102 98.2 F (36.8 C) Oral (!) 109 18 100 %  06/26/21 0432 (!) 141/101 98.2 F (36.8 C) Oral (!) 109 18 100 %  06/26/21 0034 (!) 137/98 98.7 F (37.1 C) Oral (!) 111 17 100 %  06/25/21 2042 (!) 138/98 98.2 F (36.8 C) Oral (!) 118 18 100 %  06/25/21 1625 (!) 139/97 98.2 F (36.8 C) Oral (!) 108 18 100 %    Physical Exam Vitals reviewed.  Constitutional:      General: He is not in acute distress. HENT:     Head: Normocephalic.  Eyes:     General: No scleral icterus.    Conjunctiva/sclera: Conjunctivae normal.  Cardiovascular:     Rate and Rhythm: Normal rate and regular rhythm.  Pulmonary:     Effort: Pulmonary effort is normal. No respiratory distress.     Breath sounds: Normal breath sounds.  Abdominal:     General: Bowel sounds are normal.     Palpations: Abdomen is soft.  Skin:    General: Skin is warm and dry.     Comments: Wound to right lower extremity with eschar.  Neurological:     Mental Status: He is alert and oriented to person, place, and time.    LABS:  Lab Results  Component Value Date   WBC 9.8 06/26/2021   HGB 8.7 (L) 06/26/2021   HCT 27.9 (L) 06/26/2021   PLT 823 (H) 06/26/2021   GLUCOSE 126 (H) 06/26/2021   ALT 25 06/26/2021   AST 80 (H) 06/26/2021   NA 134 (L) 06/26/2021   K 3.3 (L) 06/26/2021   CL 107 06/26/2021   CREATININE 1.18 06/26/2021   BUN 29 (H) 06/26/2021   CO2 18 (L) 06/26/2021   INR 1.2 06/25/2021    CT ABDOMEN PELVIS WO  CONTRAST  Result Date: 06/01/2021 CLINICAL DATA:  Abdominal pain, acute, nonlocalized. Cardiac arrest 8 days ago. Renal failure secondary to rhabdomyolysis. EXAM: CT ABDOMEN AND PELVIS WITHOUT CONTRAST TECHNIQUE: Multidetector CT imaging of the abdomen and pelvis was performed following the standard protocol without IV contrast. RADIATION DOSE REDUCTION: This exam was performed according to the departmental dose-optimization program which includes automated exposure  control, adjustment of the mA and/or kV according to patient size and/or use of iterative reconstruction technique. COMPARISON:  CT 2 days ago. FINDINGS: Lower chest: Small right effusion layering dependently. Patchy peripheral areas of parenchymal density in both lower lobes appear similar to the previous study and could represent infectious pneumonia or pulmonary infarctions. Calcified subcarinal lymph nodes as seen previously. Pneumomediastinum and chest wall air as seen previously, possibly subsequent to previous resuscitation. No progressive or worsening finding. Hepatobiliary: Liver parenchyma appears normal without contrast. No calcified gallstones. Pancreas: Normal Spleen: Normal Adrenals/Urinary Tract: Adrenal glands are normal. The kidneys appear enlarged consistent with acute nephritis. No evidence of obstruction or focal lesion. Bladder appears unremarkable, nearly empty. Small amount of air in the bladder as seen previously. Stomach/Bowel: Stomach appears normal. Question wall thickening in a segmental region of the ileum, possibly ischemic insult with this clinical history. No evidence of frank necrosis. No dilated small bowel. Appendix is normal. There may be mild edema of the right colon as well. Mild diverticulosis of the left colon. Vascular/Lymphatic: Aorta and IVC are normal. No adenopathy. Mild nonspecific retroperitoneal edema, often seen in critically ill patients lying supine. Reproductive: Normal Other: Tiny amount of free intraperitoneal fluid. Apparent muscular swelling of the iliopsoas and gluteal muscles that could go along with the clinical history of rhabdomyolysis. There may be some subtle hyperdensity within the right ileo psoas musculature consistent with some intramuscular hemorrhage. No large hematoma. IMPRESSION: Redemonstration of patchy bilateral peripheral lower lobe airspace density which could be due to pneumonia or pulmonary infarctions. No progressive change.  Redemonstration of pneumomediastinum and soft tissue air within the anterior chest, presumably subsequent to resuscitation. No visible pneumothorax in the region studied. Redemonstration of bowel wall edema the distal ileum and possibly the proximal ascending colon, possibly ischemic in this setting. No progressive change or evidence of frank bowel necrosis. Enlarged kidneys consistent with the history of acute renal failure. No sign of obstruction. Muscular swelling, particularly of the ileo psoas and gluteal muscles consistent with the clinical history of rhabdomyolysis. Some hemorrhage noted within the right iliopsoas muscle, which I would not categorize as a frank or significant hematoma at this time. Electronically Signed   By: Nelson Chimes M.D.   On: 06/01/2021 11:33   CT ABDOMEN PELVIS WO CONTRAST  Result Date: 05/30/2021 CLINICAL DATA:  Abdominal pain. EXAM: CT ABDOMEN AND PELVIS WITHOUT CONTRAST TECHNIQUE: Multidetector CT imaging of the abdomen and pelvis was performed following the standard protocol without IV contrast. RADIATION DOSE REDUCTION: This exam was performed according to the departmental dose-optimization program which includes automated exposure control, adjustment of the mA and/or kV according to patient size and/or use of iterative reconstruction technique. COMPARISON:  None FINDINGS: Lower chest: Signs of pneumomediastinum identified. Gas is seen tracking into bilateral cardio phrenic angles. There is also an subcutaneous gas identified along the ventral chest and ventral abdominal wall. Bilateral lower lobe airspace opacities are identified. There also scattered nodular densities noted within the lingula and right middle lobe. Hepatobiliary: No focal liver abnormality identified. Gallbladder appears unremarkable. Pancreas: Unremarkable. No pancreatic ductal dilatation or surrounding inflammatory  changes. Spleen: Normal in size without focal abnormality. Adrenals/Urinary Tract: Normal  adrenal glands. No nephrolithiasis, there is a punctate stone within the inferior pole of the left kidney. No hydronephrosis or mass identified bilaterally. Urinary bladder contains a small amount of gas. Stomach/Bowel: The stomach appears normal. There is abnormal bowel wall edema with surrounding inflammation involving the ileum and ascending colon tiny focus of gas noted within the wall of the edematous loop of bowel, image 61/6. There is diffuse surrounding fat stranding and a small volume of free fluid which extends into the pelvis. Mild generalized increase caliber of the small bowel loops proximal to the ileum noted which measure up to 2.9 cm. Vascular/Lymphatic: Normal appearance of the abdominal aorta. Within the limitations of unenhanced technique no signs of portal venous gas identified at this time no abdominopelvic adenopathy. Reproductive: Prostate is unremarkable. Other: Trace perihepatic ascites noted. There is also a small volume of free fluid noted within the pelvis. No discrete fluid collections identified. Musculoskeletal: No acute or significant osseous findings. There are nondisplaced fractures involving the costochondral cartilage of the anterior right fifth and sixth ribs which may is likely related to recent CPR. IMPRESSION: 1. Examination is positive for pneumomediastinum with subcutaneous gas identified along the ventral chest and ventral abdominal wall. 2. Bilateral lower lobe airspace opacities with scattered nodular densities noted within the lingula and right middle lobe. Findings are favored to represent multifocal pneumonia. 3. Abnormal bowel wall edema with surrounding inflammation involving the ileum and ascending colon. A tiny focus of gas noted within the wall of the edematous loop of bowel is concerning for small bowel ischemia. In the setting of recent cardiac arrest findings may reflect ischemic enteritis and colitis. 4. Mild generalized increase caliber of the small bowel  loops proximal to the ileum which measure up to 2.9 cm. No signs of high-grade bowel obstruction. 5. Small volume of free fluid noted within the abdomen and pelvis. No discrete fluid collections identified to suggest abscess. 6. Nondisplaced fractures involving the costochondral cartilage of the anterior right fifth and sixth ribs which may is likely related to recent CPR. Electronically Signed   By: Kerby Moors M.D.   On: 05/30/2021 06:43   DG Tibia/Fibula Right  Result Date: 06/23/2021 CLINICAL DATA:  Osteomyelitis EXAM: No evidence RIGHT TIBIA AND FIBULA - 2 VIEW COMPARISON:  None. FINDINGS: There is no evidence of fracture or other focal bone lesions. Of osteomyelitis. Soft tissue swelling of the anterior distal thigh. IMPRESSION: Negative for fracture or osteomyelitis. Soft tissue swelling distal thigh muscles. Electronically Signed   By: Franchot Gallo M.D.   On: 06/23/2021 16:56   DG Abd 1 View  Result Date: 06/01/2021 CLINICAL DATA:  46 year old male with history of acute abdominal pain. Bright red blood in stool since Friday. EXAM: ABDOMEN - 1 VIEW COMPARISON:  Abdominal radiograph 05/28/2021. FINDINGS: There are several gas-filled loops of small bowel throughout the abdomen, most evident in the upper abdomen, where some of these bowel loops are mildly distended measuring up to 3.7 cm in diameter. There is a relative paucity of colonic and rectal gas. No frank pneumoperitoneum. IMPRESSION: 1. Nonspecific bowel-gas pattern, as above, which could be indicative of early or partial small bowel obstruction. 2. No pneumoperitoneum. Electronically Signed   By: Vinnie Langton M.D.   On: 06/01/2021 06:37   DG Abd 1 View  Result Date: 05/28/2021 CLINICAL DATA:  Ileus. History of endotracheal tube. Cardiac arrest. EXAM: ABDOMEN - 1 VIEW COMPARISON:  Abdominal  radiograph 05/27/2021 FINDINGS: A weighted tip endotracheal tube again extends through the stomach and likely into the proximal duodenum with  the tip overlying the junction at the duodenal jejunal junction. There again is likely an additional nasogastric tube with tip within the more proximal stomach. There is again contrast material seen overlying the proximal stomach similar to prior. Right femoral approach central venous catheter tip again overlies the right L5 vertebral body. No dilated loops of bowel identified. IMPRESSION: Feeding tube with tip at the duodenal jejunal junction. Partial visualization of nasogastric tube with tip overlying the proximal stomach. Electronically Signed   By: Yvonne Kendall M.D.   On: 05/28/2021 11:51   CT THORACIC SPINE WO CONTRAST  Result Date: 05/30/2021 CLINICAL DATA:  46 year old male with a history of cardiac arrest. Possible change in lower extremity movement/sensation. EXAM: CT THORACIC AND LUMBAR SPINE WITHOUT CONTRAST TECHNIQUE: Multidetector CT imaging of the thoracic and lumbar spine was performed without contrast. Multiplanar CT image reconstructions were also generated. RADIATION DOSE REDUCTION: This exam was performed according to the departmental dose-optimization program which includes automated exposure control, adjustment of the mA and/or kV according to patient size and/or use of iterative reconstruction technique. COMPARISON:  Previous chest and abdominal radiographs. FINDINGS: CT THORACIC SPINE FINDINGS Limited cervical spine imaging: Cervicothoracic junction alignment is within normal limits. Thoracic spine segmentation: Normal, assuming unfused transverse process ossification centers at L1. Alignment:  Preserved thoracic kyphosis. No spondylolisthesis. Vertebrae: Bone mineralization is within normal limits. Thoracic vertebrae appear intact. No acute osseous abnormality identified. Paraspinal and other soft tissues: Scattered, but confluent peribronchial opacity in the right upper lobe. Right greater than left streaky and confluent bilateral lower lobe opacity. Trace if any associated pleural  fluid. Visible major airways are patent. Calcified postinflammatory mediastinal and left hilar lymph nodes. No evidence of pericardial effusion. Negative visible noncontrast upper abdominal viscera. Thoracic paraspinal soft tissues appear normal. Disc levels: Mild for age thoracic spine degeneration. There is lower thoracic posterior element hypertrophy, including calcified ligament flavum hypertrophy bilaterally at T11-T12. However, no CT evidence of thoracic disc herniation or thoracic spinal stenosis. CT LUMBAR SPINE FINDINGS Segmentation: Normal, with bilateral L1 unfused transverse process ossification centers (normal variant). Alignment: Preserved lumbar lordosis. Vertebrae: No acute osseous abnormality identified. Bone mineralization within normal limits. Visible sacrum and SI joints appear intact. Paraspinal and other soft tissues: Moderate volume of pelvic free fluid, simple fluid density. There is a right lower extremity approach central venous catheter in place, terminates at the junction of the right internal and external iliac veins. Punctate nephrolithiasis. Partially visible probable ingested tooth within a small bowel loop in the right abdomen best seen on coronal image 10. No dilated bowel identified. Otherwise negative visible noncontrast abdominal viscera. Lumbar paraspinal soft tissues appear negative. Disc levels: Mild for age lumbar spine degeneration. Lower lumbar disc bulging maximal at L4-L5. But only mild if any associated lumbar spinal stenosis. IMPRESSION: 1. No acute osseous abnormality in the thoracic or lumbar spine. Mild for age thoracic and lumbar spine degeneration. No CT evidence of thoracic spinal stenosis. And only mild if any lumbar spinal stenosis related to disc bulging at L4-L5. 2. Right Upper Lobe Bronchopneumonia. Confluent lower lobe opacity could reflect lung collapse versus additional lower lobe pneumonia. Trace if any associated pleural fluid. 3. Moderate volume of  pelvic free fluid, nonspecific although with simple fluid density favoring a transudate. 4. Ingested Tooth suspected in a right abdominal small bowel loop (series 5, image 10 of the lumbar study).  5. Nephrolithiasis. Electronically Signed   By: Genevie Ann M.D.   On: 05/30/2021 05:19   CT LUMBAR SPINE WO CONTRAST  Result Date: 05/30/2021 CLINICAL DATA:  46 year old male with a history of cardiac arrest. Possible change in lower extremity movement/sensation. EXAM: CT THORACIC AND LUMBAR SPINE WITHOUT CONTRAST TECHNIQUE: Multidetector CT imaging of the thoracic and lumbar spine was performed without contrast. Multiplanar CT image reconstructions were also generated. RADIATION DOSE REDUCTION: This exam was performed according to the departmental dose-optimization program which includes automated exposure control, adjustment of the mA and/or kV according to patient size and/or use of iterative reconstruction technique. COMPARISON:  Previous chest and abdominal radiographs. FINDINGS: CT THORACIC SPINE FINDINGS Limited cervical spine imaging: Cervicothoracic junction alignment is within normal limits. Thoracic spine segmentation: Normal, assuming unfused transverse process ossification centers at L1. Alignment:  Preserved thoracic kyphosis. No spondylolisthesis. Vertebrae: Bone mineralization is within normal limits. Thoracic vertebrae appear intact. No acute osseous abnormality identified. Paraspinal and other soft tissues: Scattered, but confluent peribronchial opacity in the right upper lobe. Right greater than left streaky and confluent bilateral lower lobe opacity. Trace if any associated pleural fluid. Visible major airways are patent. Calcified postinflammatory mediastinal and left hilar lymph nodes. No evidence of pericardial effusion. Negative visible noncontrast upper abdominal viscera. Thoracic paraspinal soft tissues appear normal. Disc levels: Mild for age thoracic spine degeneration. There is lower thoracic  posterior element hypertrophy, including calcified ligament flavum hypertrophy bilaterally at T11-T12. However, no CT evidence of thoracic disc herniation or thoracic spinal stenosis. CT LUMBAR SPINE FINDINGS Segmentation: Normal, with bilateral L1 unfused transverse process ossification centers (normal variant). Alignment: Preserved lumbar lordosis. Vertebrae: No acute osseous abnormality identified. Bone mineralization within normal limits. Visible sacrum and SI joints appear intact. Paraspinal and other soft tissues: Moderate volume of pelvic free fluid, simple fluid density. There is a right lower extremity approach central venous catheter in place, terminates at the junction of the right internal and external iliac veins. Punctate nephrolithiasis. Partially visible probable ingested tooth within a small bowel loop in the right abdomen best seen on coronal image 10. No dilated bowel identified. Otherwise negative visible noncontrast abdominal viscera. Lumbar paraspinal soft tissues appear negative. Disc levels: Mild for age lumbar spine degeneration. Lower lumbar disc bulging maximal at L4-L5. But only mild if any associated lumbar spinal stenosis. IMPRESSION: 1. No acute osseous abnormality in the thoracic or lumbar spine. Mild for age thoracic and lumbar spine degeneration. No CT evidence of thoracic spinal stenosis. And only mild if any lumbar spinal stenosis related to disc bulging at L4-L5. 2. Right Upper Lobe Bronchopneumonia. Confluent lower lobe opacity could reflect lung collapse versus additional lower lobe pneumonia. Trace if any associated pleural fluid. 3. Moderate volume of pelvic free fluid, nonspecific although with simple fluid density favoring a transudate. 4. Ingested Tooth suspected in a right abdominal small bowel loop (series 5, image 10 of the lumbar study). 5. Nephrolithiasis. Electronically Signed   By: Genevie Ann M.D.   On: 05/30/2021 05:19   CT PELVIS W CONTRAST  Result Date:  06/13/2021 CLINICAL DATA:  Psoas abscess EXAM: CT PELVIS WITH CONTRAST TECHNIQUE: Multidetector CT imaging of the pelvis was performed using the standard protocol following the bolus administration of intravenous contrast. RADIATION DOSE REDUCTION: This exam was performed according to the departmental dose-optimization program which includes automated exposure control, adjustment of the mA and/or kV according to patient size and/or use of iterative reconstruction technique. CONTRAST:  65mL OMNIPAQUE IOHEXOL 300 MG/ML  SOLN COMPARISON:  CT 06/01/2021, MRI 05/31/2021 FINDINGS: Urinary Tract:  No abnormality visualized. Bowel: Mild diverticulosis of the distal descending and proximal sigmoid colon. Moderate stool within the distal colon without evidence of obstruction. Previously noted circumferential thickening of the terminal ileum appears improved on the current examination. No evidence of focal inflammation. Appendix normal. No ascites. Vascular/Lymphatic: No pathologically enlarged lymph nodes. No significant vascular abnormality seen. Reproductive:  The prostate gland is unremarkable. Other: Diffuse subcutaneous body wall edema extending into the visualized upper extremities bilaterally has progressed slightly in the interval since prior examination. Trace retroperitoneal edema has also developed within the retroperitoneum surrounding the psoas bilaterally, within the space of Retzius, and within the presacral space. Heterogeneous attenuation and subtle thickening of the right iliopsoas musculature is again identified and appears stable since prior examination. 3 cm targetoid lesion within the right psoas at axial image # 43/3 is in compatible with the previously identified intramuscular hematoma and appears decreased in size since prior examination. No loculated retroperitoneal fluid collection identified. Musculoskeletal: No suspicious bone lesions identified. IMPRESSION: Interval decrease in size in  intramuscular hematoma within the right psoas with stable associated minimal asymmetric muscular thickening. No loculated retroperitoneal fluid collections identified. Progressive anasarca. Improved circumferential bowel wall thickening involving the terminal ileum and cecum. Mild distal colonic diverticulosis without superimposed focal inflammatory change. Electronically Signed   By: Fidela Salisbury M.D.   On: 06/13/2021 19:39   MR BRAIN W WO CONTRAST  Result Date: 05/31/2021 CLINICAL DATA:  Acute neuro deficit. Rule out stroke. Respiratory arrest. Leukocytosis. EXAM: MRI HEAD WITHOUT AND WITH CONTRAST TECHNIQUE: Multiplanar, multiecho pulse sequences of the brain and surrounding structures were obtained without and with intravenous contrast. CONTRAST:  8.43mL GADAVIST GADOBUTROL 1 MMOL/ML IV SOLN COMPARISON:  CT and 05/30/2021 FINDINGS: Brain: No acute infarction, hemorrhage, hydrocephalus, extra-axial collection or mass lesion. Normal white matter. Normal enhancement postcontrast infusion. Vascular: Normal arterial flow voids. Skull and upper cervical spine: Negative Sinuses/Orbits: Mucosal edema paranasal sinuses. Near complete opacification left sphenoid sinus with air-fluid level. Other: Motion degraded study IMPRESSION: Motion degraded exam.  Normal MRI of the brain with contrast. Mucosal edema paranasal sinuses with air-fluid level left sphenoid sinus Electronically Signed   By: Franchot Gallo M.D.   On: 05/31/2021 16:32   MR LUMBAR SPINE WO CONTRAST  Result Date: 06/09/2021 CLINICAL DATA:  Left leg complete paralysis EXAM: MRI LUMBAR SPINE WITHOUT CONTRAST TECHNIQUE: Multiplanar, multisequence MR imaging of the lumbar spine was performed. No intravenous contrast was administered. COMPARISON:  05/31/2021 FINDINGS: Segmentation:  Standard. Alignment:  Physiologic. Vertebrae: No acute fracture or suspicious osseous lesion. No evidence of discitis. Conus medullaris and cauda equina: Conus extends to the  L1-L2 level. Conus and cauda equina appear normal. Paraspinal and other soft tissues: Redemonstrated enlargement of the right iliopsoas muscle with a complex fluid collection, which is incompletely imaged but measures up to 3.3 x 2.6 cm in the axial plane, previously 3.4 x 2.6 cm when remeasured similarly. This fluid collection is now T1 isointense with a T1 hyperintense rim and T2 hyperintense with a T2 hypointense rim, previously T1 and T2 hypointense. Multiple smaller collections are also noted, the most prominent of which measures up to 0.7 cm (series 8, image 37), likely unchanged in size, with signal characteristics similar to the larger collection. Disc levels: T12-L1: No significant disc bulge. No spinal canal stenosis or neural foraminal narrowing. L1-L2: No significant disc bulge. No spinal canal stenosis or neural foraminal narrowing. L2-L3: No significant disc  bulge. No spinal canal stenosis or neural foraminal narrowing. L3-L4: Small central disc protrusion. Mild facet arthropathy. Narrowing of the lateral recesses. No spinal canal stenosis or neural foraminal narrowing. L4-L5: Mild disc bulge. Mild facet arthropathy. No spinal canal stenosis or neural foraminal narrowing. L5-S1: Mild disc bulge with central annular fissure. No spinal canal stenosis or neural foraminal narrowing. IMPRESSION: 1. Redemonstrated collections in the right iliopsoas muscle, the largest of which measures up to 3.3 cm, unchanged from the prior exam when remeasured similarly. These may represent evolving hematomas, given change in signal characteristics. 2. No spinal canal stenosis or significant neural foraminal narrowing. Electronically Signed   By: Merilyn Baba M.D.   On: 06/09/2021 20:11   MR CERVICAL SPINE W WO CONTRAST  Result Date: 05/31/2021 CLINICAL DATA:  Rule out CSF leak. Spontaneous intracranial hypotension. Cardiac arrest. Leukocytosis. EXAM: MRI CERVICAL SPINE WITHOUT AND WITH CONTRAST TECHNIQUE: Multiplanar  and multiecho pulse sequences of the cervical spine, to include the craniocervical junction and cervicothoracic junction, were obtained without and with intravenous contrast. CONTRAST:  8.26mL GADAVIST GADOBUTROL 1 MMOL/ML IV SOLN COMPARISON:  None. FINDINGS: Alignment: Normal Vertebrae: Normal bone marrow. Negative for fracture or mass. No evidence of spinal infection. Cord: Normal signal morphology. Posterior Fossa, vertebral arteries, paraspinal tissues: Mega cisterna magna. No soft tissue fluid collection in the cervical spine. Disc levels: Small right paracentral disc protrusion at C5-6 without significant stenosis. Remaining disc spaces normal. Image quality degraded by motion IMPRESSION: Motion degraded study. Small right-sided disc protrusion at C5-6 Negative for spinal infection or fluid collection in the cervical spine. Electronically Signed   By: Franchot Gallo M.D.   On: 05/31/2021 16:35   MR THORACIC SPINE W WO CONTRAST  Result Date: 05/31/2021 CLINICAL DATA:  Acute myelopathy.  Cardiac arrest.  Leukocytosis EXAM: MRI THORACIC WITHOUT AND WITH CONTRAST TECHNIQUE: Multiplanar and multiecho pulse sequences of the thoracic spine were obtained without and with intravenous contrast. CONTRAST:  8.42mL GADAVIST GADOBUTROL 1 MMOL/ML IV SOLN COMPARISON:  CT thoracic spine 05/30/2021 FINDINGS: Alignment:  Normal Vertebrae: Normal bone marrow. Negative for fracture or mass. No evidence of thoracic spine infection. Cord: Normal signal and morphology. No cord compression. Cord signal evaluation is limited by motion. Paraspinal and other soft tissues: Small right pleural effusion. No paraspinous mass or edema. Disc levels: There is significant disc degeneration. No thoracic spinal stenosis. IMPRESSION: Motion degraded study.  No significant abnormality thoracic spine. Electronically Signed   By: Franchot Gallo M.D.   On: 05/31/2021 16:29   MR Lumbar Spine W Wo Contrast  Result Date: 05/31/2021 CLINICAL DATA:   Acute myelopathy.  Leukocytosis.  Cardiac arrest  . EXAM: MRI LUMBAR SPINE WITHOUT AND WITH CONTRAST TECHNIQUE: Multiplanar and multiecho pulse sequences of the lumbar spine were obtained without and with intravenous contrast. CONTRAST:  8.67mL GADAVIST GADOBUTROL 1 MMOL/ML IV SOLN COMPARISON:  CT lumbar spine 05/30/2021 FINDINGS: Segmentation:  5 lumbar vertebra. Alignment:  Normal Vertebrae: Negative for fracture or mass. No evidence of discitis osteomyelitis. Conus medullaris and cauda equina: Conus extends to the L1-2 level. Conus and cauda equina appear normal. Paraspinal and other soft tissues: Enlargement of the right iliopsoas muscles. Complex fluid collection right psoas muscle measuring 3.2 cm. The fluid collection is hypointense on T2 and T1. Smaller adjacent fluid collection is seen inferior to the larger collection. There is enhancement of the right iliopsoas muscle around the fluid collections. Mild stranding in the tissues surrounding the muscles. Disc levels: L1-2: Negative L2-3: Negative L3-4:  Small central disc protrusion and bilateral facet degeneration. Mild subarticular stenosis bilaterally L4-5: Disc bulging and facet degeneration.  Negative for stenosis L5-S1: Mild disc bulging.  Negative for stenosis. Image quality degraded by motion. IMPRESSION: Multiple fluid collections in the right ileo psoas muscle. The largest collection is 3.2 cm. There is surrounding enhancement and enlargement of the muscles. Findings most likely due to hematoma or abscess. The patient has had a right femoral vascular catheter. No evidence of discitis osteomyelitis in the lumbar spine. Motion degraded study. Electronically Signed   By: Franchot Gallo M.D.   On: 05/31/2021 16:22   MR FEMUR LEFT W WO CONTRAST  Result Date: 06/03/2021 CLINICAL DATA:  Left thigh swelling with concern for potential compartment syndrome causing femoral nerve deficit. Muscular swelling on CT suspicious for rhabdomyolysis. Soft tissue  infection suspected. EXAM: MR OF THE LEFT LOWER EXTREMITY WITHOUT AND WITH CONTRAST TECHNIQUE: Multiplanar, multisequence MR imaging of the left thigh was performed both before and after administration of intravenous contrast. CONTRAST:  51mL GADAVIST GADOBUTROL 1 MMOL/ML IV SOLN COMPARISON:  Abdominopelvic CT 06/01/2021 and 05/30/2021. FINDINGS: Bones/Joint/Cartilage No evidence of acute fracture, dislocation or bone destruction. There is no abnormal osseous signal or enhancement. There is no significant hip joint effusion. The knee joint is incompletely visualized. Ligaments Not relevant for exam/indication. Muscles and Tendons There is generalized T2 hyperintensity throughout the left thigh musculature which is greatest inferiorly in the left gluteus maximus muscle. The muscle is enlarged with heterogeneous T2 signal and heterogeneous enhancement following contrast. There are irregular areas of decreased enhancement within the muscle which may reflect necrosis. No well-defined abscess demonstrated. Similar heterogeneous enhancement is present within the quadratus femoris muscle. Lesser enhancement is seen throughout the additional left thigh musculature. There is also some edema and enhancement within the visualized right thigh musculature. Soft tissues Moderate generalized subcutaneous edema throughout the left thigh without focal fluid collection or foreign body. No evidence of soft tissue emphysema. There is heterogeneous T2 hyperintensity and enhancement surrounding the sciatic nerve which appears normal in caliber and signal. No vascular abnormalities are apparent. Soft tissue edema is noted in the lower pelvis. IMPRESSION: 1. Diffuse muscular abnormalities throughout the visualized portions of both thighs with heterogeneous T2 signal and enhancement, especially within the left gluteus maximus and quadratus femoris muscles. Findings are compatible with the diagnosis of rhabdomyolysis, with the differential  including myositis related to connective tissue disease and polymyositis. No drainable abscess identified. 2. Associated diffuse subcutaneous edema which is nonspecific and may reflect cellulitis. 3. These findings could contribute to compartment syndrome in the appropriate clinical context. This requires clinical evaluation to confirm/exclude. 4. No evidence of osteomyelitis or septic arthritis. Electronically Signed   By: Richardean Sale M.D.   On: 06/03/2021 09:52   DG Chest Port 1 View  Result Date: 06/10/2021 CLINICAL DATA:  Fever, cardiac arrest at home. EXAM: PORTABLE CHEST 1 VIEW COMPARISON:  06/03/2021. FINDINGS: The heart size and mediastinal contours are within normal limits. Lung volumes are low. Strandy opacities are noted in the perihilar region on the right and left lung base, similar in appearance to the prior exam. No effusion or pneumothorax. A right internal jugular central venous catheter terminates over the superior vena cava. No acute osseous abnormality. IMPRESSION: Strandy opacities in the lungs bilaterally, possible subsegmental atelectasis in not significantly changed from the prior exam. Electronically Signed   By: Brett Fairy M.D.   On: 06/10/2021 20:05   DG CHEST PORT 1 VIEW  Result  Date: 06/03/2021 CLINICAL DATA:  Cardiac arrest, chest soreness, pneumomediastinum. EXAM: PORTABLE CHEST 1 VIEW COMPARISON:  06/01/2021. FINDINGS: Right IJ central line tip is in the SVC. Heart size stable. Lungs are low in volume with platelike density in the right perihilar region. There may be additional subsegmental airspace opacification in the left lower lobe. No appreciable residual pneumomediastinum. IMPRESSION: 1. Low lung volumes with subsegmental atelectasis in the right perihilar region and left lower lobe. 2. No definite residual pneumomediastinum. Electronically Signed   By: Lorin Picket M.D.   On: 06/03/2021 08:47   DG CHEST PORT 1 VIEW  Result Date: 06/01/2021 CLINICAL DATA:   Placement of central venous catheter EXAM: PORTABLE CHEST 1 VIEW COMPARISON:  05/30/2021 FINDINGS: Transverse diameter of heart is increased. There is interval decrease in pulmonary vascular congestion and pulmonary edema. There is poor inspiration. There are no new focal infiltrates. There is interval placement of large caliber right IJ central venous catheter with its tip in the superior vena cava close to the right atrium. There is no significant pleural effusion or pneumothorax. IMPRESSION: There is interval decrease in pulmonary vascular congestion and pulmonary edema. There is no pneumothorax. Tip of right IJ central venous catheter is seen in the superior vena cava close to the right atrium. Electronically Signed   By: Elmer Picker M.D.   On: 06/01/2021 16:22   DG Chest Port 1 View  Result Date: 05/30/2021 CLINICAL DATA:  History of cardiac arrest. EXAM: PORTABLE CHEST 1 VIEW COMPARISON:  Chest radiograph 05/28/2021 FINDINGS: Monitoring leads overlie the patient. Stable cardiac and mediastinal contours. Interval removal of left IJ central venous catheter and enteric tube as well as extubation. Persistent pneumomediastinum. Low lung volumes with bibasilar heterogeneous opacities. Redemonstrated lucency within the left supraclavicular fossa. IMPRESSION: Interval removal of enteric tube, ET tube and left IJ catheter. Persistent pneumomediastinum. Redemonstrated lucency left supraclavicular fossa which may represent additional gas tracking from the chest. Low lung volumes with bibasilar opacities favored to represent atelectasis. Electronically Signed   By: Lovey Newcomer M.D.   On: 05/30/2021 10:12   DG Chest Port 1 View  Result Date: 05/28/2021 CLINICAL DATA:  46 year old male status post cardiac arrest. EXAM: PORTABLE CHEST 1 VIEW COMPARISON:  Portable chest 05/27/2021 and earlier. FINDINGS: Portable AP semi upright view at 0512 hours. Stable lines and tubes. Regressed but not resolved  pneumomediastinum since yesterday, although subcutaneous gas now visible in the left supraclavicular fossa. Questionable left chest wall gas also now. But no pneumothorax identified. Mediastinal contours remain normal. Stable lung volumes. Mild patchy peribronchial opacity in the right lower lung is new. No pulmonary edema or other confluent opacity. Stable small volume contrast at the gastric fundus. Negative visible bowel gas. Stable visualized osseous structures. IMPRESSION: 1. Stable lines and tubes. 2. Regressed but not resolved pneumomediastinum since yesterday, and subcutaneous gas now visible in the left supraclavicular fossa - likely tracking from the chest. Continue to suspect this is incidental, such as barotrauma related. And no pneumothorax is identified. 3. New patchy right lower lung peribronchial opacity, favor atelectasis. Electronically Signed   By: Genevie Ann M.D.   On: 05/28/2021 08:57   CT HEAD CODE STROKE WO CONTRAST  Result Date: 05/30/2021 CLINICAL DATA:  46 year old male with a history of cardiac arrest. Possible lower extremity weakness or change in sensation, but moving all extremities in the CT department, code stroke status canceled. EXAM: CT HEAD WITHOUT CONTRAST TECHNIQUE: Contiguous axial images were obtained from the base of the skull  through the vertex without intravenous contrast. RADIATION DOSE REDUCTION: This exam was performed according to the departmental dose-optimization program which includes automated exposure control, adjustment of the mA and/or kV according to patient size and/or use of iterative reconstruction technique. COMPARISON:  Head CT 03/24/2022. FINDINGS: Brain: Normal cerebral volume. No midline shift, ventriculomegaly, mass effect, evidence of mass lesion, intracranial hemorrhage or evidence of cortically based acute infarction. Gray-white matter differentiation is within normal limits throughout the brain. Mega cisterna magna, normal variant. Vascular: No  suspicious intracranial vascular hyperdensity. Skull: Stable, intact. Sinuses/Orbits: Extubated now. Moderate posterior paranasal sinus mucosal thickening and bubbly opacity persists. Tympanic cavities and mastoids are clear. Other: Trace retained secretions in the visible pharynx. Negative orbit and scalp soft tissues. ASPECTS [facility] Stroke Program Early CT Score) Total score (0-10 with 10 being normal): 10 IMPRESSION: 1. Stable and normal noncontrast CT appearance of the brain. 2. Paranasal sinus opacification following intubation. Electronically Signed   By: Genevie Ann M.D.   On: 05/30/2021 04:54   VAS [place] LOWER EXTREMITY VENOUS (DVT)  Result Date: 06/26/2021  Lower Venous DVT Study Patient Name:  GURSHAAN MATSUOKA [facility]  Date of Exam:   06/26/2021 Medical Rec #: [id number]          Accession #:    [id number] Date of Birth: 1975-07-13           Patient Gender: M Patient Age:   15 years Exam Location:  [facility] Procedure:      VAS [place] LOWER EXTREMITY VENOUS (DVT) Referring Phys: Erin Hearing --------------------------------------------------------------------------------  Indications: Unexplained thrombocytosis, and Pain.  Comparison Study: 06/02/21 prior Performing Technologist: Archie Patten RVS  Examination Guidelines: A complete evaluation includes B-mode imaging, spectral Doppler, color Doppler, and power Doppler as needed of all accessible portions of each vessel. Bilateral testing is considered an integral part of a complete examination. Limited examinations for reoccurring indications may be performed as noted. The reflux portion of the exam is performed with the patient in reverse Trendelenburg.  +---------+---------------+---------+-----------+----------+--------------+  RIGHT     Compressibility Phasicity Spontaneity Properties Thrombus Aging  +---------+---------------+---------+-----------+----------+--------------+  CFV       Full            Yes       Yes                                     +---------+---------------+---------+-----------+----------+--------------+  SFJ       Full                                                             +---------+---------------+---------+-----------+----------+--------------+  FV Prox   Full                                                             +---------+---------------+---------+-----------+----------+--------------+  FV Mid    Full                                                             +---------+---------------+---------+-----------+----------+--------------+  FV Distal Full                                                             +---------+---------------+---------+-----------+----------+--------------+  PFV       Full                                                             +---------+---------------+---------+-----------+----------+--------------+  POP       Full            Yes       Yes                                    +---------+---------------+---------+-----------+----------+--------------+  PTV       Full                                                             +---------+---------------+---------+-----------+----------+--------------+  PERO      Full                                                             +---------+---------------+---------+-----------+----------+--------------+   +---------+---------------+---------+-----------+----------+--------------+  LEFT      Compressibility Phasicity Spontaneity Properties Thrombus Aging  +---------+---------------+---------+-----------+----------+--------------+  CFV       Full            Yes       Yes                                    +---------+---------------+---------+-----------+----------+--------------+  SFJ       Full                                                             +---------+---------------+---------+-----------+----------+--------------+  FV Prox   Full                                                              +---------+---------------+---------+-----------+----------+--------------+  FV Mid    Full                                                             +---------+---------------+---------+-----------+----------+--------------+  FV Distal Full                                                             +---------+---------------+---------+-----------+----------+--------------+  PFV       Full                                                             +---------+---------------+---------+-----------+----------+--------------+  POP       Full            Yes       Yes                                    +---------+---------------+---------+-----------+----------+--------------+  PTV       Full                                                             +---------+---------------+---------+-----------+----------+--------------+  PERO      Full                                                             +---------+---------------+---------+-----------+----------+--------------+    Summary: BILATERAL: - No evidence of deep vein thrombosis seen in the lower extremities, bilaterally. -No evidence of popliteal cyst, bilaterally.   *See table(s) above for measurements and observations.    Preliminary    VAS [place] LOWER EXTREMITY VENOUS (DVT)  Result Date: 06/02/2021  Lower Venous DVT Study Patient Name:  ESLI JERNIGAN [facility]  Date of Exam:   06/02/2021 Medical Rec #: [id number]          Accession #:    [id number] Date of Birth: 27-Dec-1975           Patient Gender: M Patient Age:   88 years Exam Location:  [facility] Procedure:      VAS [place] LOWER EXTREMITY VENOUS (DVT) Referring Phys: ANAND HONGALGI --------------------------------------------------------------------------------  Indications: Leukocytosis unclear etiol.  Comparison Study: no prior Performing Technologist: Archie Patten RVS  Examination Guidelines: A complete evaluation includes B-mode imaging, spectral Doppler, color Doppler, and power Doppler as needed of  all accessible portions of each vessel. Bilateral testing is considered an integral part of a complete examination. Limited examinations for reoccurring indications may be performed as noted. The reflux portion of the exam is performed with the patient in reverse Trendelenburg.  +---------+---------------+---------+-----------+----------+--------------+  RIGHT     Compressibility Phasicity Spontaneity Properties Thrombus Aging  +---------+---------------+---------+-----------+----------+--------------+  CFV       Full            Yes       Yes                                    +---------+---------------+---------+-----------+----------+--------------+  SFJ       Full                                                             +---------+---------------+---------+-----------+----------+--------------+  FV Prox   Full                                                             +---------+---------------+---------+-----------+----------+--------------+  FV Mid    Full                                                             +---------+---------------+---------+-----------+----------+--------------+  FV Distal Full                                                             +---------+---------------+---------+-----------+----------+--------------+  PFV       Full                                                             +---------+---------------+---------+-----------+----------+--------------+  POP       Full            Yes       Yes                                    +---------+---------------+---------+-----------+----------+--------------+  PTV       Full                                                             +---------+---------------+---------+-----------+----------+--------------+  PERO      Full                                                             +---------+---------------+---------+-----------+----------+--------------+    +---------+---------------+---------+-----------+----------+--------------+  LEFT      Compressibility Phasicity Spontaneity Properties Thrombus Aging  +---------+---------------+---------+-----------+----------+--------------+  CFV       Full            Yes       Yes                                    +---------+---------------+---------+-----------+----------+--------------+  SFJ       Full                                                             +---------+---------------+---------+-----------+----------+--------------+  FV Prox   Full                                                             +---------+---------------+---------+-----------+----------+--------------+  FV Mid    Full                                                             +---------+---------------+---------+-----------+----------+--------------+  FV Distal Full                                                             +---------+---------------+---------+-----------+----------+--------------+  PFV       Full                                                             +---------+---------------+---------+-----------+----------+--------------+  POP       Full            Yes       Yes                                    +---------+---------------+---------+-----------+----------+--------------+  PTV       Full                                                             +---------+---------------+---------+-----------+----------+--------------+  PERO      Full                                                             +---------+---------------+---------+-----------+----------+--------------+     Summary: BILATERAL: - No evidence of deep vein thrombosis seen in the lower extremities, bilaterally. -No evidence of popliteal cyst, bilaterally.   *See table(s) above for measurements and observations. Electronically signed by Deitra Mayo MD on 06/02/2021 at 9:07:31 PM.    Final      ASSESSMENT AND PLAN:  1.  Thrombocytosis, likely  reactive 2.  Anemia, secondary to iron deficiency, heme positive stools 3.  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  Psoas abscess 5.  AKI, improved  -Discussed labs with the patient today through use of interpreter.  His hemoglobin and platelets were normal on admission which makes an underlying hematologic condition less likely. -He has thrombocytosis which is likely reactive due to iron deficiency anemia.  His ferritin is normal, but iron and percent saturation are low which are indicative of iron deficiency.  He was noted to have heme positive stools on 05/30/2021.  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 -Recommend that he receive a dose of Feraheme. -Recommend PRBC transfusion for hemoglobin less than 7.  Thank you for this referral.  Mikey Bussing, DNP, AGPCNP-BC, AOCNP   ADDENDUM: I agree with the above assessment by Erasmo Downer.  Again, when he came in, his CBC was relatively normal.  He had normal platelets.  It was after he got in the hospital that his thrombocytosis developed.  As such, this is clearly reactive process.  When he came in, he was iron deficient.  I am sure this is part of him drinking.  It does not look like he is had any iron.  Of course, there is no blood smear for me to look at.  We will have to have 1 made.  I really do not see that we have been having issue hematologically.  Again I had to believe that this is a reactive process.  Once I look at his blood smear, then I can make some more recommendations.  He has been in the hospital for almost a month now.  It would not surprise me that he has hematologic abnormalities with all the blood that is been drawn and the fact that he developed acute renal insufficiency that is improving.  He still has a marked CK elevation.  I suspect that he probably will take a good month or so before the platelets begin to normalize again.  He is going to have to stop drinking.   Ultimately, this may be toxic to his marrow and he could develop thrombocytopenia.  We will follow along.  I do not see that we have to put him through a bone marrow biopsy.  I do not see that we need to do any testing for essential thrombocythemia.  Again, when he came in, his CBC was fine.  Everything that has become abnormal is iatrogenic.  I know that he will get wonderful care from the staff up on [facility].  Lattie Haw, MD  Psalm 46:10

## 2021-06-26 NOTE — Consult Note (Signed)
[facility] Nurse wound follow up Patient receiving care in [facility] 5M04 Re-consulted for right knee wound for change in wound care orders Wound type: superficial right knee wound S/P Intraosseous needle insertion  Measurement:10 cm x 11 cm  Wound bed: 85% black moist eschar and 15% pink friable tissue surrounding the wound.  Drainage (amount, consistency, odor) tan brown scant drainage on foam dressing.  Periwound: intact Dressing procedure/placement/frequency: Clean the wound with NS, then apply a nickel thick layer of Santyl over the wound, Cover with a moistened saline gauze, dry gauze and secure with foam dressing. Clean and reapply the Santyl every shift. If the wound has an obvious improvement with eschar sloughing off of the wound then the Santyl can be discontinued. If unsure then re-consult the [facility] team and we can take a look at the wound again.   Monitor the wound area(s) for worsening of condition such as: Signs/symptoms of infection, increase in size, development of or worsening of odor, development of pain, or increased pain at the affected locations.   Notify the medical team if any of these develop.  Thank you for the consult. [facility] nurse will not follow at this time.   Please re-consult the [place] team if needed.  Cathlean Marseilles Tamala Julian, MSN, RN, [place], Lysle Pearl, [facility] Wound Treatment Associate Pager [phone]

## 2021-06-26 NOTE — Progress Notes (Signed)
Occupational Therapy Treatment Patient Details Name: Andres Braun MRN: [id number] DOB: Oct 30, 1975 Today's Date: 06/26/2021   History of present illness 46 y.o. male admitted 05/24/21 after cardiac arrest at home; pt with agonal breathing, lost pulse, required 20-min CPR and 5 rounds epi. Workup for acute hypoxic respiratory failure, septic shock due to undertermined organism, AKI, acute metabolic encephalopathy, aspiration PNA of bilateral lower lobes due to vomit. ETT 1/15-1/19. CRRT 1/15-1/20. Brain MRI 1/22 negative. Cervical MRI 1/22 with small R-side disc protrusion C5-6. Abdominal CT showed muscular swelling, particularly iliopsoas and gluteal muscles consistent with rhabdomyolysis; hemorrhage noted within R iliopsoas. S/p diagnostic laparoscopy 1/24. MRI L femur 1/25 showed diffuse muscular abnormalities bilateral thighs with heterogenous T2 signal, especially within L glut max and gradratus femoris; findings compatible with dx of rhabdomyolysis, no drainable abscess identified; these findings could contribute to compartment syndrome. 2/17 new onset of rhabdo notedUnknown PMH.   OT comments  Pt session very limited by pain despite the medication at 9:24 Am prior to session start. Pt noted to have new CK 3000+ in labs and new onset of rhabdo by NP hospitalist staff. Pt limited with numbness noted in BiL LE with vision occlusion. Pt decreased accuracy with testing this session compared to previous session. Pt with poor PO intake and helped order lunch food with dietary staff present. Pt reports ensure causes diarrhea and does not like them. Pt reports not wanting the food because its "salty and hemodialysis concerns'. Pt enjoys fruit plates, cheese, pepsi, chicken broth so far this admission.    Recommendations for follow up therapy are one component of a multi-disciplinary discharge planning process, led by the attending physician.  Recommendations may be updated based on patient status,  additional functional criteria and insurance authorization.    Follow Up Recommendations  Skilled nursing-short term rehab (<3 hours/day)    Assistance Recommended at Discharge Frequent or constant Supervision/Assistance  Patient can return home with the following  A lot of help with walking and/or transfers;A lot of help with bathing/dressing/bathroom;Assistance with cooking/housework;Assist for transportation;Direct supervision/assist for financial management;Direct supervision/assist for medications management;Help with stairs or ramp for entrance   Equipment Recommendations  BSC/3in1;Wheelchair (measurements OT);Wheelchair cushion (measurements OT) (elevated leg rest, drop arm commode)    Recommendations for Other Services Rehab consult    Precautions / Restrictions Precautions Precautions: Fall Precaution Comments: check BP       Mobility Bed Mobility Overal bed mobility: Needs Assistance Bed Mobility: Rolling Rolling: Min guard         General bed mobility comments: pt very painful and declines OOB    Transfers                         Balance                                           ADL either performed or assessed with clinical judgement   ADL Overall ADL's : Needs assistance/impaired                                       General ADL Comments: pt using HOB elevated and bed rail able to roll to the L side and place bed pan. pt able to hold with L UE and wipe buttock at  this time. Recommend flat bed and no bed rails to see if patient can simulate this same task for home d/c plans.    Extremity/Trunk Assessment Upper Extremity Assessment Upper Extremity Assessment: RUE deficits/detail;LUE deficits/detail RUE Deficits / Details: reports pain in hands. pt's hands noted to have skin peeling off and painful to make a fist. pt reports pain and decreased extension of digits. pt is able to complete motions with increase  time LUE Deficits / Details: reports pain in hands. pt's hands noted to have skin peeling off and painful to make a fist. pt reports pain and decreased extension of digits. pt is able to complete motions with increase time   Lower Extremity Assessment Lower Extremity Assessment: RLE deficits/detail;LLE deficits/detail RLE Deficits / Details: noted to have dark center to wound and reports pain in leg. pt with decreased sensation LLE Deficits / Details: decreased sensation with vision occluded. reports numbness, burning and "sleep feeling" when asked directly. pt with pain with attempts to done ted hose and unable to sustain being still due to discomfort        Vision       Perception     Praxis      Cognition Arousal/Alertness: Awake/alert Behavior During Therapy: Flat affect Overall Cognitive Status: Within Functional Limits for tasks assessed                                          Exercises Other Exercises Other Exercises: AROM of bil hands digit flexion/ extension    Shoulder Instructions       General Comments Abdominal binder and ted hose located in room. a tentative w/c has been secured and placed in Systems analyst office for patient to utilize in session. blue geo mat also in chair for usage.    Pertinent Vitals/ Pain       Pain Assessment Pain Assessment: Faces Pain Score: 10-Worst pain ever Pain Descriptors / Indicators: Discomfort, Grimacing, Guarding Pain Intervention(s): Monitored during session, Premedicated before session, Repositioned  Home Living                                          Prior Functioning/Environment              Frequency  Min 2X/week        Progress Toward Goals  OT Goals(current goals can now be found in the care plan section)  Progress towards OT goals: Not progressing toward goals - comment (new onset of rhabdo noted in lab work)  Acute Rehab OT Goals Patient Stated Goal: painful  everywhere patient reports OT Goal Formulation: With patient Time For Goal Achievement: 07/10/21 Potential to Achieve Goals: Good ADL Goals Pt Will Perform Grooming: with set-up;with supervision;sitting Pt Will Perform Upper Body Bathing: with set-up;with supervision;sitting Pt Will Perform Lower Body Bathing: with min assist;with adaptive equipment;sitting/lateral leans Pt Will Transfer to Toilet: with min assist;squat pivot transfer;bedside commode Pt Will Perform Toileting - Clothing Manipulation and hygiene: with min assist;sitting/lateral leans Additional ADL Goal #1: Pt will be Min A in and OOB for basic ADLs Additional ADL Goal #2: pt will complete bed level bed pain mod I with flat bed surface Additional ADL Goal #3: pt will complete w/c transfer mod I  Plan Discharge plan remains appropriate  Co-evaluation                 AM-PAC OT "6 Clicks" Daily Activity     Outcome Measure   Help from another person eating meals?: None Help from another person taking care of personal grooming?: A Little Help from another person toileting, which includes using toliet, bedpan, or urinal?: A Lot Help from another person bathing (including washing, rinsing, drying)?: A Lot Help from another person to put on and taking off regular upper body clothing?: A Little Help from another person to put on and taking off regular lower body clothing?: A Lot 6 Click Score: 16    End of Session    OT Visit Diagnosis: Unsteadiness on feet (R26.81);Other abnormalities of gait and mobility (R26.89);Muscle weakness (generalized) (M62.81);Other symptoms and signs involving cognitive function;Pain   Activity Tolerance Patient limited by pain   Patient Left in bed;with call bell/phone within reach   Nurse Communication Mobility status;Precautions        Time: 3546-5681 OT Time Calculation (min): 29 min  Charges: OT General Charges $OT Visit: 1 Visit OT Treatments $Self Care/Home  Management : 23-37 mins   Brynn, OTR/L  Acute Rehabilitation Services Pager: [phone] Office: [phone] .   Mateo Flow 06/26/2021, 11:05 AM

## 2021-06-26 NOTE — TOC Progression Note (Signed)
Transition of Care [facility]) - Progression Note    Patient Details  Name: Andres Braun MRN: [id number] Date of Birth: 17-Nov-1975  [facility] of [facility] CM/SW Contact  Janae Bridgeman, RN Phone Number: 06/26/2021, 10:39 AM  Clinical Narrative:    CM and MSW with DTP Team will continue to follow the patient for discharge needs.  The patient is currently not medically stable for discharge to home and continues to work with therapies for mobility / strengthening.  [place], PT states that the patient will be placed on the [facility] Team for daily therapy to progress the patient with mobility needs since SNF placement is not an option at this time.   Expected Discharge Plan: OP Rehab Barriers to Discharge: Continued Medical Work up, Family Issues  Expected Discharge Plan and Services Expected Discharge Plan: OP Rehab In-house Referral: Clinical Social Work, PCP / Management consultant, Interpreting Services Discharge Planning Services: CM Consult, [employer], MATCH Program, Medication Assistance, Follow-up appt scheduled Post Acute Care Choice: IP Rehab Living arrangements for the past 2 months: Single Family Home                 DME Arranged: Wheelchair manual, 3-N-1 (Patient states that his boss will build a ramp on the home by next week - 06/29/2021)                     Social Determinants of Health (SDOH) Interventions    Readmission Risk Interventions Readmission Risk Prevention Plan 06/25/2021  Transportation Screening Complete  PCP or Specialist Appt within 5-7 Days Complete  Home Care Screening Complete  Medication Review (RN CM) Complete

## 2021-06-26 NOTE — Discharge Planning (Signed)
Oncology Discharge Planning Admission Note  [facility] Address: [address] [place], [place], [place] [address] Hours of Operation:  8am - 5pm, Monday - Friday  Clinic Contact Information:  [phone]  Oncology Care Team: Medical Oncologist:    Jenell Milliner, NP is aware of this hospital admission dated 05/24/21 and has assessed patient at the bedside. The cancer center will follow [facility] inpatient care to assist with discharge planning as indicated by the oncologist.  We will arrange follow up if needed closer to discharge.  Disclaimer:  This Cancer Center nursing note does not imply a formal consult request has been made by the admitting attending for this admission or there will be an inpatient consult completed by oncology.  Please request oncology consults as per standard process as indicated.

## 2021-06-26 NOTE — Progress Notes (Signed)
Lower extremity venous has been completed.   Preliminary results in CV Proc.   Aundra Millet Rhiley Tarver 06/26/2021 10:35 AM

## 2021-06-26 NOTE — Progress Notes (Signed)
PT Cancellation Note  Patient Details Name: Andres Braun MRN: [id number] DOB: 05/31/1975   Cancelled Treatment:    Reason Eval/Treat Not Completed: Pain limiting ability to participate Patient reporting severe pain in bilateral LE with any movement. Limited OT session this AM. Patient stating that they are going to change pain medication. PT will re-attempt at later date.   My Rinke A. Gilford Rile PT, DPT Acute Rehabilitation Services Pager [phone] Office [phone]   Linna Hoff 06/26/2021, 2:08 PM

## 2021-06-27 ENCOUNTER — Inpatient Hospital Stay (HOSPITAL_COMMUNITY): Payer: Self-pay

## 2021-06-27 DIAGNOSIS — R748 Abnormal levels of other serum enzymes: Secondary | ICD-10-CM

## 2021-06-27 DIAGNOSIS — D62 Acute posthemorrhagic anemia: Secondary | ICD-10-CM

## 2021-06-27 LAB — COMPREHENSIVE METABOLIC PANEL
ALT: 24 U/L (ref 0–44)
AST: 63 U/L — ABNORMAL HIGH (ref 15–41)
Albumin: 2.5 g/dL — ABNORMAL LOW (ref 3.5–5.0)
Alkaline Phosphatase: 157 U/L — ABNORMAL HIGH (ref 38–126)
Anion gap: 11 (ref 5–15)
BUN: 21 mg/dL — ABNORMAL HIGH (ref 6–20)
CO2: 19 mmol/L — ABNORMAL LOW (ref 22–32)
Calcium: 8.9 mg/dL (ref 8.9–10.3)
Chloride: 103 mmol/L (ref 98–111)
Creatinine, Ser: 1.01 mg/dL (ref 0.61–1.24)
GFR, Estimated: >60 mL/min (ref >60)
Glucose, Bld: 101 mg/dL — ABNORMAL HIGH (ref 70–99)
Potassium: 3.5 mmol/L (ref 3.5–5.1)
Sodium: 133 mmol/L — ABNORMAL LOW (ref 135–145)
Total Bilirubin: 0.4 mg/dL (ref 0.3–1.2)
Total Protein: 7.1 g/dL (ref 6.5–8.1)

## 2021-06-27 LAB — CBC WITH DIFFERENTIAL/PLATELET
Abs Immature Granulocytes: 0.12 10*3/uL — ABNORMAL HIGH (ref 0.00–0.07)
Basophils Absolute: 0.1 10*3/uL (ref 0.0–0.1)
Basophils Relative: 1 %
Eosinophils Absolute: 0.3 10*3/uL (ref 0.0–0.5)
Eosinophils Relative: 3 %
HCT: 29 % — ABNORMAL LOW (ref 39.0–52.0)
Hemoglobin: 9 g/dL — ABNORMAL LOW (ref 13.0–17.0)
Immature Granulocytes: 1 %
Lymphocytes Relative: 32 %
Lymphs Abs: 3.4 10*3/uL (ref 0.7–4.0)
MCH: 26 pg (ref 26.0–34.0)
MCHC: 31 g/dL (ref 30.0–36.0)
MCV: 83.8 fL (ref 80.0–100.0)
Monocytes Absolute: 0.7 10*3/uL (ref 0.1–1.0)
Monocytes Relative: 7 %
Neutro Abs: 6 10*3/uL (ref 1.7–7.7)
Neutrophils Relative %: 56 %
Platelets: 808 10*3/uL — ABNORMAL HIGH (ref 150–400)
RBC: 3.46 MIL/uL — ABNORMAL LOW (ref 4.22–5.81)
RDW: 15.6 % — ABNORMAL HIGH (ref 11.5–15.5)
WBC: 10.6 10*3/uL — ABNORMAL HIGH (ref 4.0–10.5)
nRBC: 0 % (ref 0.0–0.2)

## 2021-06-27 LAB — ANA W/REFLEX IF POSITIVE: Anti Nuclear Antibody (ANA): NEGATIVE

## 2021-06-27 LAB — CK: Total CK: 1263 U/L — ABNORMAL HIGH (ref 49–397)

## 2021-06-27 LAB — SEDIMENTATION RATE: Sed Rate: 110 mm/hr — ABNORMAL HIGH (ref 0–16)

## 2021-06-27 LAB — SJOGRENS SYNDROME-A EXTRACTABLE NUCLEAR ANTIBODY: SSA (Ro) (ENA) Antibody, IgG: 0.2 AI (ref 0.0–0.9)

## 2021-06-27 LAB — ANTI-JO 1 ANTIBODY, IGG: Anti JO-1: <0.2 AI (ref 0.0–0.9)

## 2021-06-27 IMAGING — MR MR FEMUR*L* W/O CM
18 series · 40 of 40 positions shown · non-contrast
Comparison: Pelvis CT [DATE]

CLINICAL DATA: Severe lower extremity pain

EXAM:
MR OF THE LEFT FEMUR WITHOUT CONTRAST
TECHNIQUE: Multiplanar, multisequence MR imaging of the left femur was
performed. No intravenous contrast was administered.

[Series 2: T1 · coronal · left · 5.0mm · 1.25mm/px · 1 of 36 slices shown (1 of 4)]
[im 1/36]
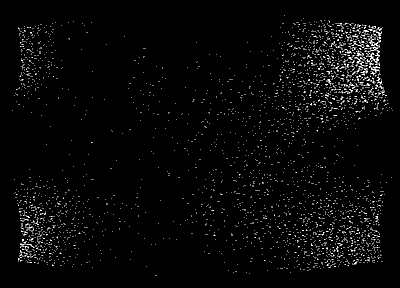

[Series 3: T1 · coronal · left · 5.0mm · 1.25mm/px · 2 of 36 slices shown (2 of 4)]
[im 1/36]
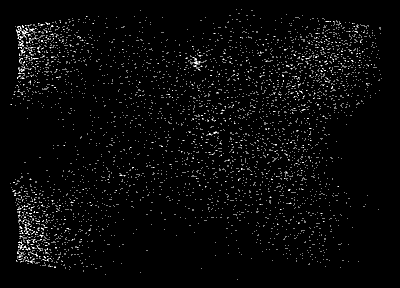
[im 36/36]
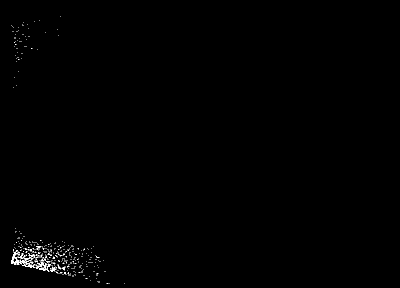

[Series 4: composed cor t1_comp · coronal · left · 6.0mm · 1.25mm/px · 2 of 38 slices shown]
[im 1/38]
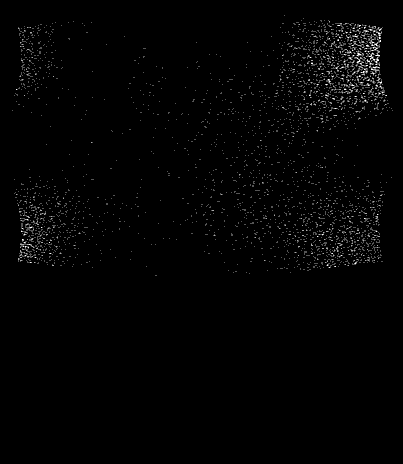
[im 38/38]
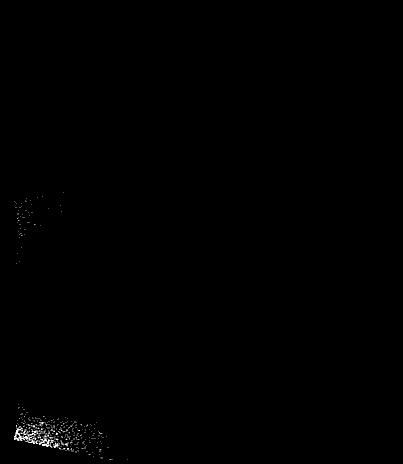

[Series 5: composed cor t1_comp_filt · coronal · left · 6.0mm · 1.25mm/px · 2 of 38 slices shown]
[im 1/38]
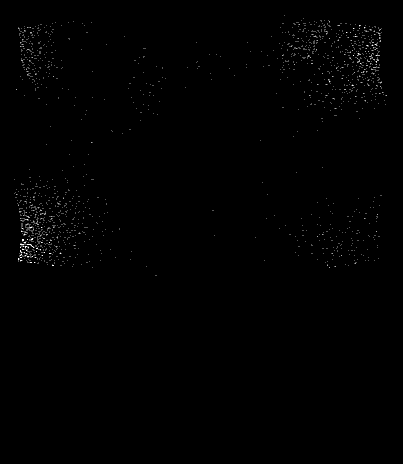
[im 38/38]
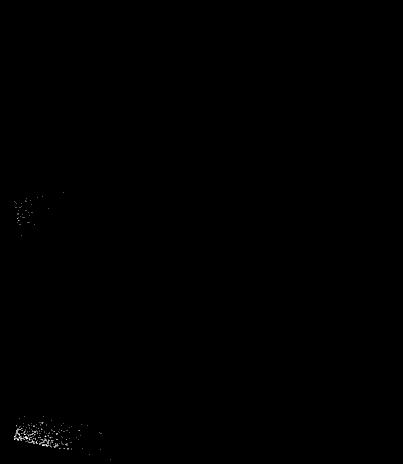

[Series 6: STIR · coronal · left · 5.0mm · 1.25mm/px · 2 of 36 slices shown (1 of 2)]
[im 1/36]
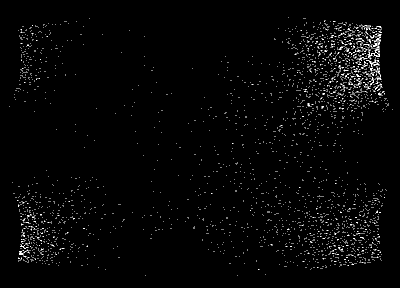
[im 36/36]
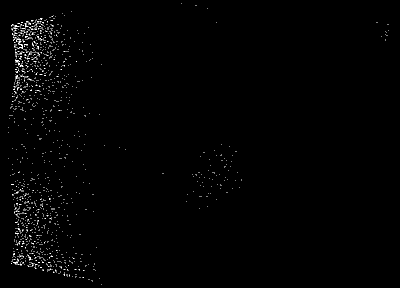

[Series 7: STIR · coronal · left · 5.0mm · 1.25mm/px · 2 of 36 slices shown (2 of 2)]
[im 1/36]
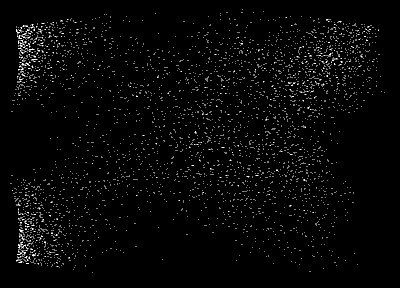
[im 36/36]
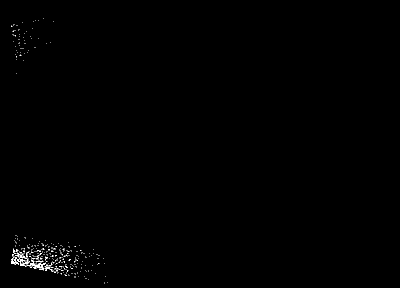

[Series 8: composed cor stir_comp · coronal · left · 6.0mm · 1.25mm/px · 2 of 39 slices shown]
[im 1/39]
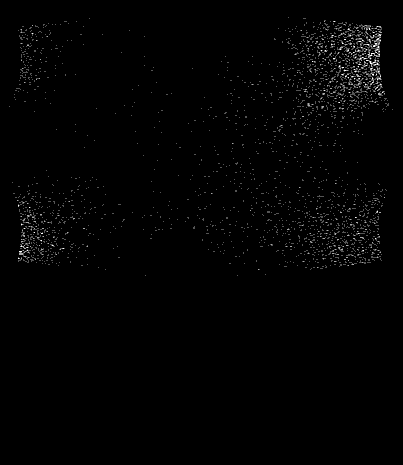
[im 39/39]
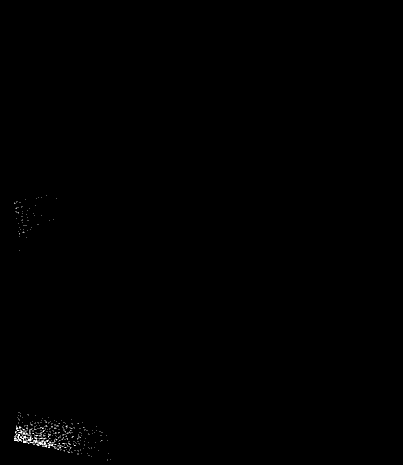

[Series 9: composed cor stir_comp_filt · coronal · left · 6.0mm · 1.25mm/px · 2 of 39 slices shown]
[im 1/39]
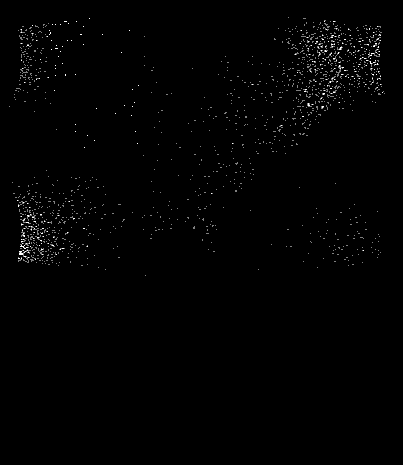
[im 39/39]
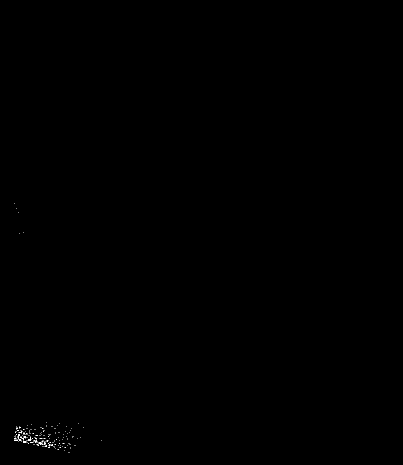

[Series 10: T1 · axial · left · 5.0mm · 0.70mm/px · z∈[-33,+273]mm · 3 of 52 slices shown (3 of 4)]
[im 1/52]
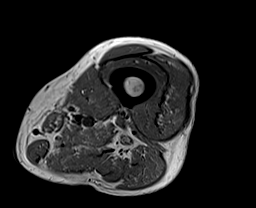
[im 26/52]
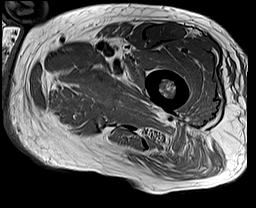
[im 52/52]
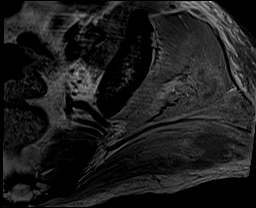

[Series 11: T1 · axial · left · 5.0mm · 0.70mm/px · z∈[-233,-5]mm · 2 of 39 slices shown (4 of 4)]
[im 1/39]
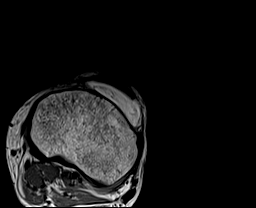
[im 39/39]
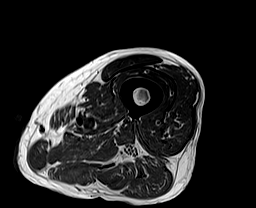

[Series 12: ax t1_comp · axial · left · 5.0mm · 0.70mm/px · z∈[-233,+273]mm · 4 of 85 slices shown]
[im 1/85]
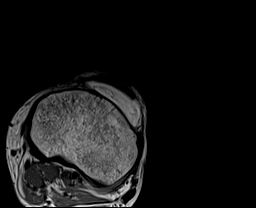
[im 29/85]
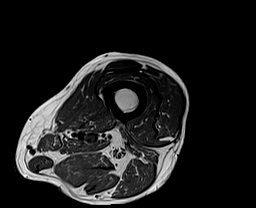
[im 57/85]
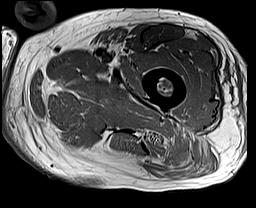
[im 85/85]
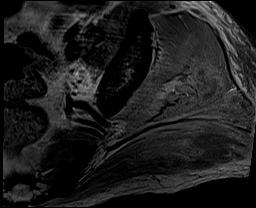

[Series 13: T2 fat-sat · axial · left · 5.0mm · 0.87mm/px · z∈[-33,+273]mm · 3 of 52 slices shown (1 of 4)]
[im 1/52]
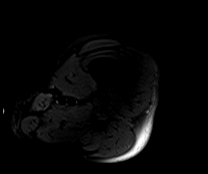
[im 26/52]
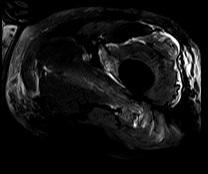
[im 52/52]
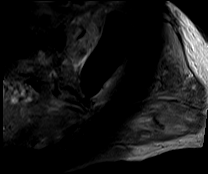

[Series 14: T2 fat-sat · axial · left · 5.0mm · 0.87mm/px · z∈[-233,-5]mm · 2 of 39 slices shown (2 of 4)]
[im 1/39]
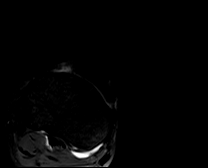
[im 39/39]
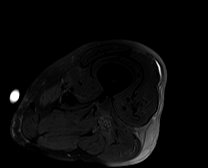

[Series 15: T2 · axial · left · 5.0mm · 0.87mm/px · z∈[-15,+273]mm · 3 of 49 slices shown (1 of 3)]
[im 1/49]
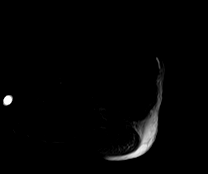
[im 25/49]
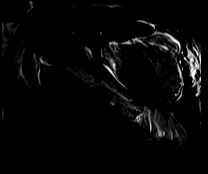
[im 49/49]
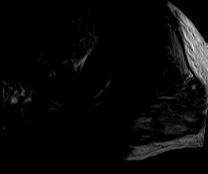

[Series 15: T2 · axial · left · 5.0mm · 0.87mm/px · z∈[-233,-23]mm · 2 of 36 slices shown (2 of 3)]
[im 1/36]
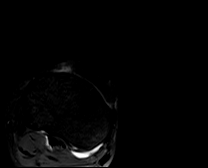
[im 36/36]
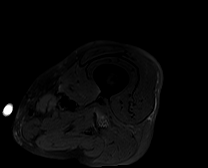

[Series 16: T2 fat-sat · sagittal · left · 4.0mm · 1.15mm/px · 2 of 38 slices shown (3 of 4)]
[im 1/38]
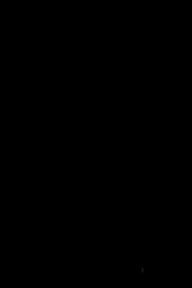
[im 38/38]
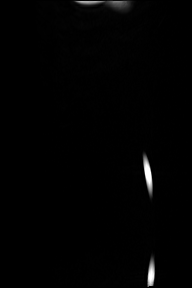

[Series 17: T2 fat-sat · sagittal · left · 4.0mm · 1.15mm/px · 2 of 38 slices shown (4 of 4)]
[im 1/38]
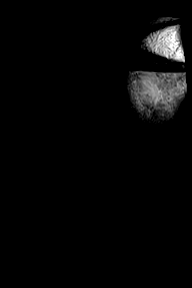
[im 38/38]
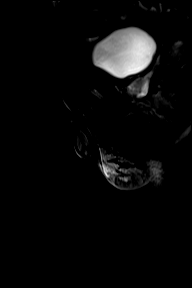

[Series 18: T2 · sagittal · left · 5.0mm · 1.15mm/px · 2 of 38 slices shown (3 of 3)]
[im 1/38]
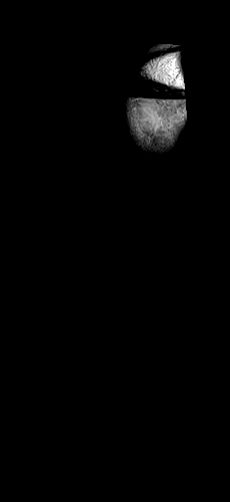
[im 38/38]
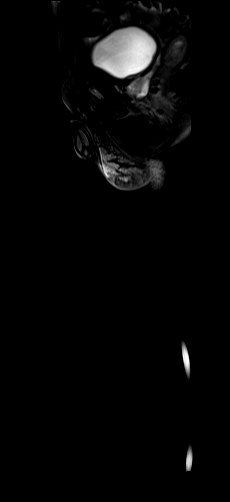

[40 of 40 positions shown; findings below may reference images not displayed]

FINDINGS: Bones/Joint/Cartilage

There is no evidence of acute fracture. Partially visualized marrow
heterogeneity within the proximal tibias bilaterally. Heterogeneous
right knee effusion with blooming artifact. No significant left hip
effusion.

Muscles and Tendons

There is extensive intramuscular edema of the left hip and thigh.
Edema is seen within the iliopsoas muscle, abductor muscles,
proximal anterior compartment musculature, and left gluteal
musculature. No significant muscle atrophy. No focal intramuscular
collection to suggest myonecrosis or pyomyositis. There is inter
fascial fluid in the anterior compartment and rim of fluid along the
superficial surface of the vastus lateralis muscle which is favored
to be reactive.

Soft tissues

Extensive subcutaneous soft tissue swelling of the thigh.
IMPRESSION: Extensive intramuscular edema of the hips and thighs, right worse
than left, consistent with nonspecific myositis. Subcutaneous edema
medially and laterally of the left thigh. Interfascial fluid
proximally and fluid collecting superficially along the surface of
the vastus lateralis muscle, favored to be reactive. No
well-defined/drainable intramuscular collection to suggest mild
necrosis or pyomyositis.

## 2021-06-27 IMAGING — MR MR FEMUR*R* W/O CM
6 series · 40 of 40 positions shown · non-contrast
Comparison: Pelvis CT [DATE].

CLINICAL DATA: suspected inflammatory myositis both legs

EXAM:
MRI OF THE RIGHT FEMUR WITHOUT CONTRAST
TECHNIQUE: Multiplanar, multisequence MR imaging of the right femur was
performed. No intravenous contrast was administered.

[Series 6: composed cor stir_comp · coronal · B · 6.0mm · 1.25mm/px · 5 of 30 slices shown]
[im 1/30]
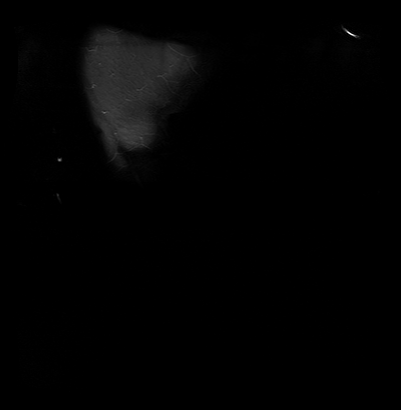
[im 8/30]
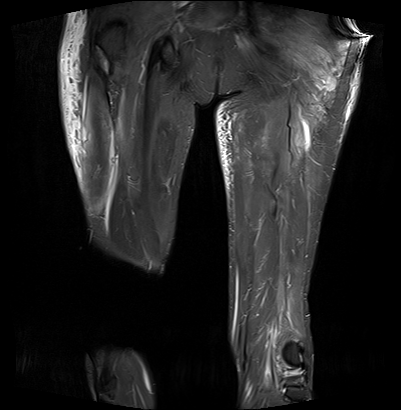
[im 15/30]
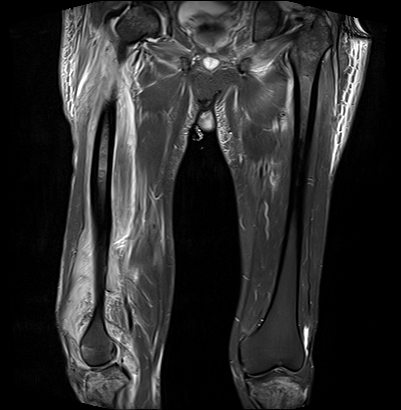
[im 22/30]
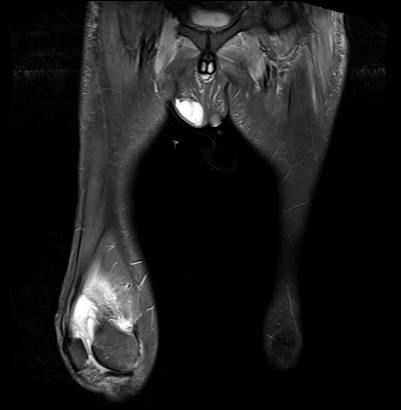
[im 30/30]
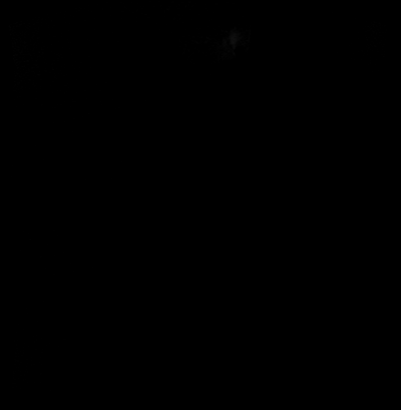

[Series 10: composed cor t1_comp · coronal · B · 6.0mm · 1.25mm/px · 5 of 30 slices shown]
[im 1/30]
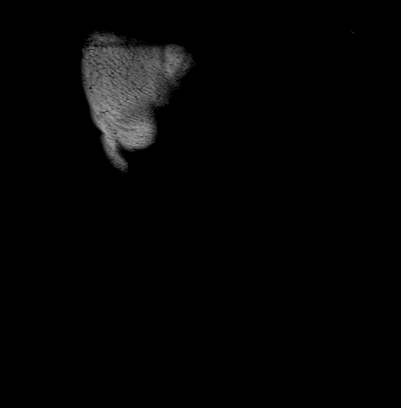
[im 8/30]
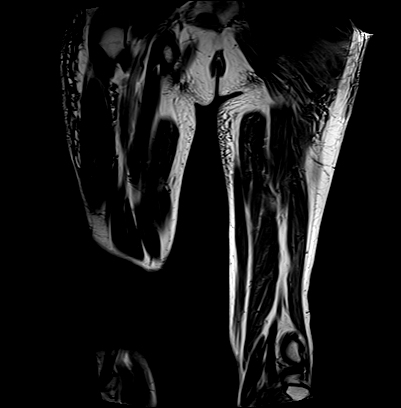
[im 15/30]
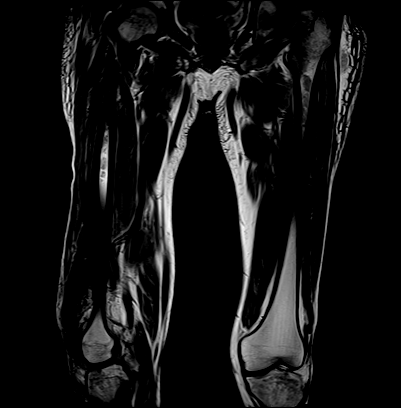
[im 22/30]
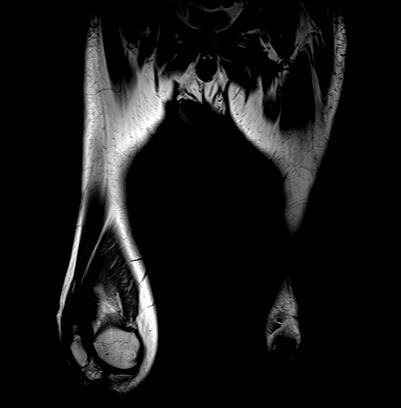
[im 30/30]
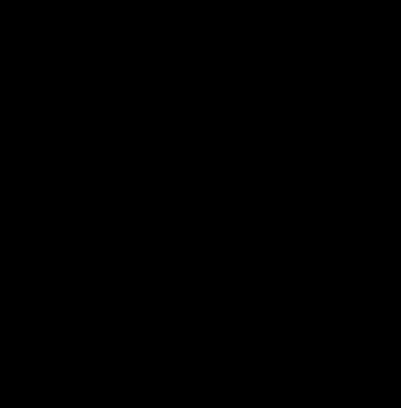

[Series 15: ax t1_comp · axial · B · 5.0mm · 0.70mm/px · z∈[-122,+380]mm · 13 of 86 slices shown]
[im 1/86]
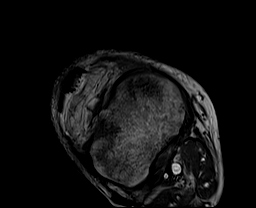
[im 8/86]
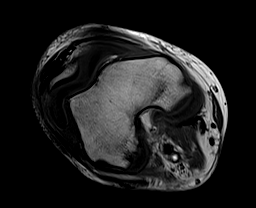
[im 15/86]
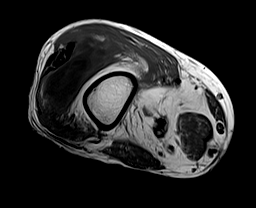
[im 22/86]
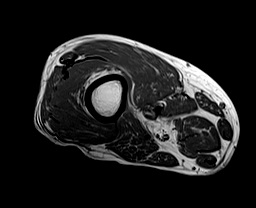
[im 29/86]
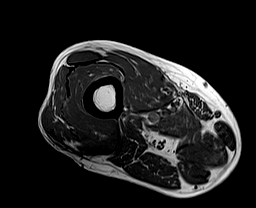
[im 36/86]
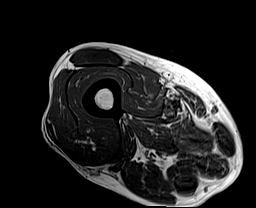
[im 43/86]
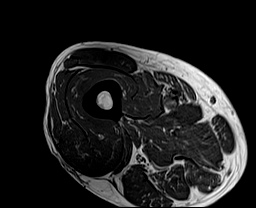
[im 50/86]
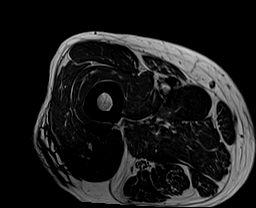
[im 57/86]
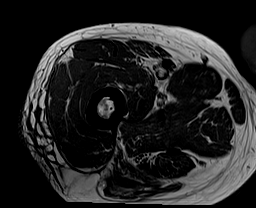
[im 64/86]
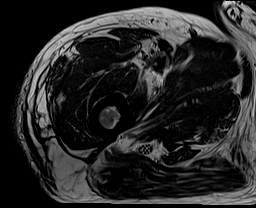
[im 71/86]
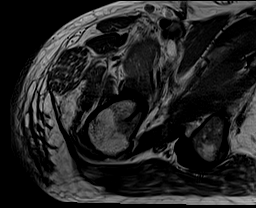
[im 78/86]
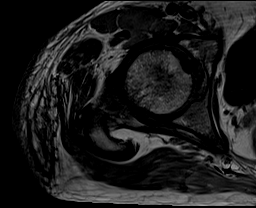
[im 86/86]
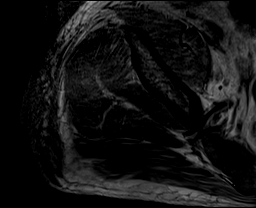

[Series 18: T2 · axial · B · 5.0mm · 0.87mm/px · z∈[-122,+128]mm · 6 of 43 slices shown (1 of 3)]
[im 1/43]
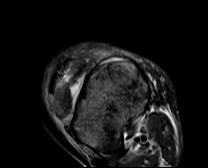
[im 9/43]
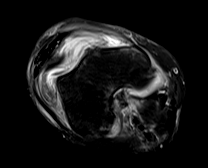
[im 17/43]
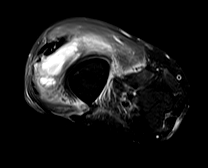
[im 26/43]
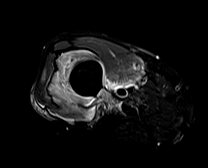
[im 34/43]
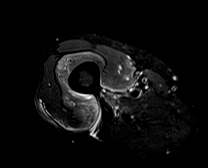
[im 43/43]
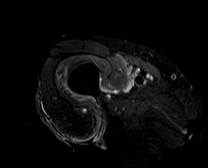

[Series 18: T2 · axial · B · 5.0mm · 0.87mm/px · z∈[+129,+380]mm · 6 of 43 slices shown (2 of 3)]
[im 1/43]
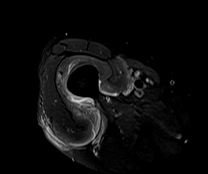
[im 9/43]
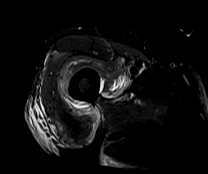
[im 17/43]
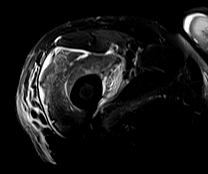
[im 26/43]
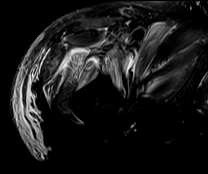
[im 34/43]
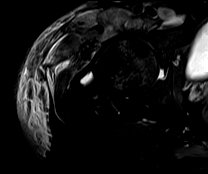
[im 43/43]
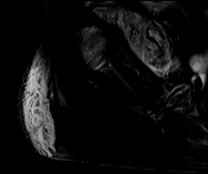

[Series 21: T2 · sagittal · B · 5.0mm · 1.15mm/px · 5 of 35 slices shown (3 of 3)]
[im 1/35]
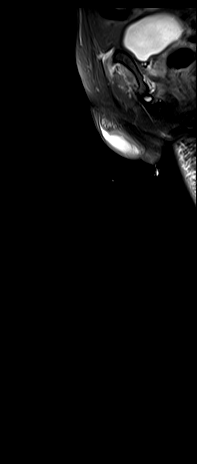
[im 9/35]
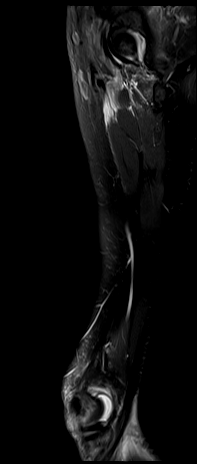
[im 18/35]
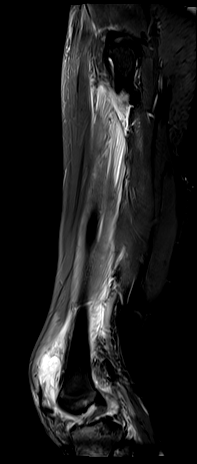
[im 26/35]
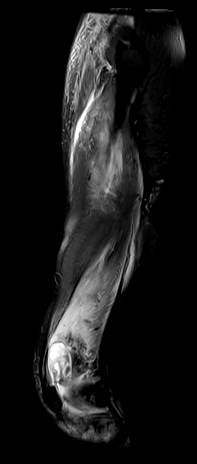
[im 35/35]
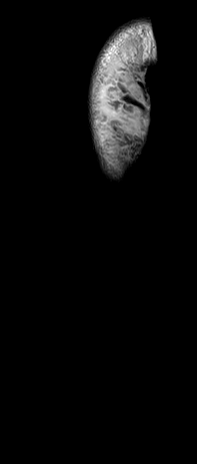

[40 of 40 positions shown; findings below may reference images not displayed]

FINDINGS: Bones/Joint/Cartilage

No evidence of acute fracture. There is partially visualized marrow
edema within the proximal tibias bilaterally. There is a large
heterogeneous right knee joint effusion.

Muscles and Tendons

There is extensive intramuscular edema of the right hip and thigh.
Edema seen within the iliopsoas muscle, adductor muscles, vastus
muscles in the anterior compartment. There is no focal intramuscular
collection suggest to suggest myonecrosis or pyomyositis. There is
interfascial fluid in the anterior compartment. On the large
field-of-view images, there is also proximal intramuscular edema
along the left hip and proximal thigh.

Soft tissues

There is subcutaneous soft tissue swelling along the lateral thigh.
IMPRESSION: Extensive intramuscular edema of the proximal hips and thighs, with
interfascial fluid proximally, and extending more distally in the
right thigh in the anterior compartment. Subcutaneous edema
laterally. Findings are consistent with non-specific myositis. No
focal fluid collection to suggest myonecrosis or pyomyositis.

Heterogeneous right knee joint effusion and partially visualized
heterogeneous marrow signal within the proximal right tibia. Similar
heterogeneous marrow signal within the left proximal tibia to a
slightly lesser degree with small left knee effusion. These findings
are nonspecific however concerning for possible septic arthritis of
the right knee joint. The right knee joint effusion could
potentially be reactive but heterogeneity is concerning. Consider
joint aspiration of the right knee.

## 2021-06-27 MED ORDER — SODIUM CHLORIDE 0.9 % IV SOLN
510.0000 mg | Freq: Once | INTRAVENOUS | Status: AC
  Administered 2021-06-27: 510 mg via INTRAVENOUS
  Filled 2021-06-27: qty 17

## 2021-06-27 NOTE — Progress Notes (Signed)
Inpatient Rehab Admissions Coordinator:   I have been requested to take another look at this Pt. For potential CIR admission. Per TOC, pt. Will have a place to live with his boss, [name] but will not have any physical assist at discharge. At this time, medical issues/ severe pain are limiting Pt.'s ability to participate in therapies, so I do not feel confident that Pt. Will be able to reach mod I wheelchair goals by the time he leaves CIR. I will follow for participation and progress with therapies, but Pt. Will need to be tolerating and doing significantly more than he was able to on 06/26/21 in order to be considered for CIR. I will follow for progress and participation with therapies. Hopefully he able to do more once pain is better managed.   Clemens Catholic, [place], [place] Admissions Coordinator  [phone] ([place]) [phone] (office)

## 2021-06-27 NOTE — Progress Notes (Signed)
For pt's ordered MRI exams, pt was able to get through entire Right Femur exam but refused to continue to Lt femur exam, potentially due to severe pain in legs. RN made aware. Unsure if reattempt of Lt Femur will be needed due to partial bilateral coverage of a routine Right Femur exam. Will check back later to confirm plan. Pt sent back to room via pt transport.

## 2021-06-27 NOTE — Progress Notes (Addendum)
i was finally able to find the blood smear.  The blood smear looks like he is iron deficient.  He has microcytic red blood cells.  There are some hypochromic red blood cells.  The white blood cells appear mature.  I do not see any hypersegmented polys.  He has no immature myeloid or lymphoid cells.  Platelets are increased in number.  However, platelets are pretty much small in size.  There may be a rare large platelet.  Platelets are well granulated.  Again I suspect the thrombocytosis is going to be iron deficiency anemia.  He needs IV iron.  I will put an order in for IV iron today.  He did have Dopplers done yesterday which were negative for any type of thromboembolic disease.  I do suspect that the platelet count will drop as he improves.  Again, when he came in to the hospital, his platelet count was fine.  His hemoglobin was fine.  Everything that has happened has been iatrogenic.   Christin Bach, MD  [place] 3:19

## 2021-06-27 NOTE — Progress Notes (Signed)
PROGRESS NOTE    Andres Braun  BDZ:[id number] DOB: [birth year] DOA: 05/24/2021 PCP: Merryl Hacker, No    Brief Narrative:  46 year old male admitted to ICU 1/15 under PCCM care after cardiac arrest at home.  PEA arrest, s/p CPR x20 minutes and epi x5.  Patient reportedly out drinking with his friends the night prior to admission.  Found by friends with agonal breathing.  Intubated on arrival, needed vasopressors, noted to have renal failure, rhabdomyolysis with CK in the 14 K range, treated for aspiration pneumonia, nephrology consulted and started on CRRT.  Extubated 1/19 and off pressors.  Mental status slowly improved.  Transferred to telemetry.  Care transferred to [facility] on 1/24.  Nephrology following for HD needs.  General surgery on board for questionable small bowel ischemia and taken emergently to surgery 1/24. The bowel in question was found to be thickened, but viable. Hospital course complicated by unexplained left lower extremity weakness. Extensive work up of the neuraxis is negative for compressive pathology. Differential is probably a psoas hematoma causing lumbar plexus compression/ irritation leading to secondary weakness. IR suggested that it is not amenable for aspiration or drain placement. Neurology consulted, suggested not a neurological injury as the neuraxis is negative.   2/18 sleeping this am. C/o didn't sleep last night  Consultants:    Procedures:   Antimicrobials:      Subjective: No sob or cp. Still with LLE pain ,now running from buttocks down  Objective: Vitals:   06/26/21 2216 06/27/21 0500 06/27/21 0523 06/27/21 0900  BP: (!) 142/99  (!) 123/97 132/90  Pulse: (!) 107  (!) 110 (!) 112  Resp: _0 Temp: 98 F (36.7 C)  98.6 F (37 C) 98 F (36.7 C)  TempSrc: Oral  Oral Oral  SpO2: 100%  100% 100%  Weight:  68.5 kg    Height:        Intake/Output Summary (Last 24 hours) at 06/27/2021 1449 Last data filed at 06/27/2021 1231 Gross per 24 hour  Intake  2300 ml  Output 3400 ml  Net -1100 ml   Filed Weights   06/22/21 0500 06/24/21 0552 06/27/21 0500  Weight: 70.6 kg 69.3 kg 68.5 kg    Examination: Calm, NAD Cta no w/r Reg s1/s2 no gallop Soft benign +bs No edema Aaoxox3  Mood and affect appropriate in current setting     Data Reviewed: I have personally reviewed following labs and imaging studies  CBC: Recent Labs  Lab 06/23/21 0742 06/24/21 0724 06/25/21 1448 06/26/21 0508 06/27/21 0056  WBC 11.0* 10.7*  --  9.8 10.6*  NEUTROABS  --   --   --  5.5 6.0  HGB 9.2* 9.3*  --  8.7* 9.0*  HCT 30.6* 30.4*  --  27.9* 29.0*  MCV 86.0 84.4  --  84.0 83.8  PLT 873* 898* 780* 823* 341*   Basic Metabolic Panel: Recent Labs  Lab 06/21/21 0259 06/22/21 0307 06/24/21 0724 06/25/21 1316 06/26/21 0508 06/27/21 0056  NA 136 133* 133* 133* 134* 133*  K 4.1 4.4 3.7 3.6 3.3* 3.5  CL 104 102 102 105 107 103  CO2 20* 20* 19* 18* 18* 19*  GLUCOSE 116* 99 99 158* 126* 101*  BUN 38* 39* 40* 36* 29* 21*  CREATININE 2.23* 2.00* 1.64* 1.45* 1.18 1.01  CALCIUM 9.2 9.4 9.5 9.3 9.0 8.9  PHOS 3.8 3.7  --   --   --   --    GFR: Estimated Creatinine Clearance:  89.5 mL/min (by C-G formula based on SCr of 1.01 mg/dL). Liver Function Tests: Recent Labs  Lab 06/21/21 0259 06/22/21 0307 06/26/21 0508 06/27/21 0056  AST  --   --  80* 63*  ALT  --   --  25 24  ALKPHOS  --   --  161* 157*  BILITOT  --   --  0.3 0.4  PROT  --   --  7.3 7.1  ALBUMIN 2.2* 2.3* 2.4* 2.5*   No results for input(s): LIPASE, AMYLASE in the last 168 hours. No results for input(s): AMMONIA in the last 168 hours. Coagulation Profile: Recent Labs  Lab 06/25/21 1448  INR 1.2   Cardiac Enzymes: Recent Labs  Lab 06/21/21 0259 06/25/21 1316 06/26/21 0508 06/27/21 0056  CKTOTAL 95 3,182* 2,185* 1,263*   BNP (last 3 results) No results for input(s): PROBNP in the last 8760 hours. HbA1C: No results for input(s): HGBA1C in the last 72 hours. CBG: No  results for input(s): GLUCAP in the last 168 hours. Lipid Profile: No results for input(s): CHOL, HDL, LDLCALC, TRIG, CHOLHDL, LDLDIRECT in the last 72 hours. Thyroid Function Tests: No results for input(s): TSH, T4TOTAL, FREET4, T3FREE, THYROIDAB in the last 72 hours. Anemia Panel: No results for input(s): VITAMINB12, FOLATE, FERRITIN, TIBC, IRON, RETICCTPCT in the last 72 hours. Sepsis Labs: No results for input(s): PROCALCITON, LATICACIDVEN in the last 168 hours.  No results found for this or any previous visit (from the past 240 hour(s)).       Radiology Studies: MR [facility] RIGHT WO CONTRAST  Result Date: 06/27/2021 CLINICAL DATA:  suspected inflammatory myositis both legs EXAM: MRI OF THE RIGHT FEMUR WITHOUT CONTRAST TECHNIQUE: Multiplanar, multisequence MR imaging of the right femur was performed. No intravenous contrast was administered. COMPARISON:  Pelvis CT 06/13/2021. FINDINGS: Bones/Joint/Cartilage No evidence of acute fracture. There is partially visualized marrow edema within the proximal tibias bilaterally. There is a large heterogeneous right knee joint effusion. Muscles and Tendons There is extensive intramuscular edema of the right hip and thigh. Edema seen within the iliopsoas muscle, adductor muscles, vastus muscles in the anterior compartment. There is no focal intramuscular collection suggest to suggest myonecrosis or pyomyositis. There is interfascial fluid in the anterior compartment. On the large field-of-view images, there is also proximal intramuscular edema along the left hip and proximal thigh. Soft tissues There is subcutaneous soft tissue swelling along the lateral thigh. IMPRESSION: Extensive intramuscular edema of the proximal hips and thighs, with interfascial fluid proximally, and extending more distally in the right thigh in the anterior compartment. Subcutaneous edema laterally. 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 No focal fluid collection to  suggest myonecrosis or pyomyositis. Heterogeneous right knee joint effusion and partially visualized heterogeneous marrow signal within the proximal right tibia. Similar heterogeneous marrow signal within the left proximal tibia to a slightly lesser degree with small left knee effusion. These findings are nonspecific however concerning for possible septic arthritis of the right knee joint. The right knee joint effusion could potentially be reactive but heterogeneity is concerning. Consider joint aspiration of the right knee. Electronically Signed   By: Maurine Simmering M.D.   On: 06/27/2021 13:54   VAS [place] LOWER EXTREMITY VENOUS (DVT)  Result Date: 06/26/2021  Lower Venous DVT Study Patient Name:  JOHN WILLIAMSEN [facility]  Date of Exam:   06/26/2021 Medical Rec #: [id number]          Accession #:    [id number] Date of Birth: 07-05-1975  Patient Gender: M Patient Age:   58 years Exam Location:  [facility] Procedure:      VAS [place] LOWER EXTREMITY VENOUS (DVT) Referring Phys: Erin Hearing --------------------------------------------------------------------------------  Indications: Unexplained thrombocytosis, and Pain.  Comparison Study: 06/02/21 prior Performing Technologist: Archie Patten RVS  Examination Guidelines: A complete evaluation includes B-mode imaging, spectral Doppler, color Doppler, and power Doppler as needed of all accessible portions of each vessel. Bilateral testing is considered an integral part of a complete examination. Limited examinations for reoccurring indications may be performed as noted. The reflux portion of the exam is performed with the patient in reverse Trendelenburg.  +---------+---------------+---------+-----------+----------+--------------+  RIGHT     Compressibility Phasicity Spontaneity Properties Thrombus Aging  +---------+---------------+---------+-----------+----------+--------------+  CFV       Full            Yes       Yes                                     +---------+---------------+---------+-----------+----------+--------------+  SFJ       Full                                                             +---------+---------------+---------+-----------+----------+--------------+  FV Prox   Full                                                             +---------+---------------+---------+-----------+----------+--------------+  FV Mid    Full                                                             +---------+---------------+---------+-----------+----------+--------------+  FV Distal Full                                                             +---------+---------------+---------+-----------+----------+--------------+  PFV       Full                                                             +---------+---------------+---------+-----------+----------+--------------+  POP       Full            Yes       Yes                                    +---------+---------------+---------+-----------+----------+--------------+  PTV  Full                                                             +---------+---------------+---------+-----------+----------+--------------+  PERO      Full                                                             +---------+---------------+---------+-----------+----------+--------------+   +---------+---------------+---------+-----------+----------+--------------+  LEFT      Compressibility Phasicity Spontaneity Properties Thrombus Aging  +---------+---------------+---------+-----------+----------+--------------+  CFV       Full            Yes       Yes                                    +---------+---------------+---------+-----------+----------+--------------+  SFJ       Full                                                             +---------+---------------+---------+-----------+----------+--------------+  FV Prox   Full                                                              +---------+---------------+---------+-----------+----------+--------------+  FV Mid    Full                                                             +---------+---------------+---------+-----------+----------+--------------+  FV Distal Full                                                             +---------+---------------+---------+-----------+----------+--------------+  PFV       Full                                                             +---------+---------------+---------+-----------+----------+--------------+  POP       Full            Yes       Yes                                    +---------+---------------+---------+-----------+----------+--------------+  PTV       Full                                                             +---------+---------------+---------+-----------+----------+--------------+  PERO      Full                                                             +---------+---------------+---------+-----------+----------+--------------+     Summary: BILATERAL: - No evidence of deep vein thrombosis seen in the lower extremities, bilaterally. -No evidence of popliteal cyst, bilaterally.   *See table(s) above for measurements and observations. Electronically signed by Orlie Pollen on 06/26/2021 at 6:28:20 PM.    Final         Scheduled Meds:  (feeding supplement) PROSource Plus  30 mL Oral TID BM   amLODipine  5 mg Oral Daily   amoxicillin-clavulanate  1 tablet Oral Q12H   Chlorhexidine Gluconate Cloth  6 each Topical Daily   collagenase   Topical BID   docusate sodium  100 mg Oral BID   doxycycline  100 mg Oral Q12H   feeding supplement  1 Container Oral TID BM   folic acid  1 mg Per Tube Daily   Gerhardt's butt cream   Topical TID   heparin injection (subcutaneous)  5,000 Units Subcutaneous Q8H   lidocaine  1 patch Transdermal Q24H   methocarbamol  500 mg Oral Q12H   multivitamin  1 tablet Oral QHS   oxyCODONE  7.5 mg Oral Q4H   pantoprazole  40 mg Oral BID    polyethylene glycol  17 g Oral Daily   pregabalin  50 mg Oral TID   sertraline  25 mg Oral QHS   sodium bicarbonate  650 mg Oral Daily   Continuous Infusions:  sodium chloride 10 mL/hr at 06/25/21 1725   lactated ringers 150 mL/hr at 06/27/21 0836    Assessment & Plan:   Active Problems:   AKI (acute kidney injury)/dehydration secondary to persistent poor oral intake   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HCC)   Peripheral vascular disease of LEFT lower extremity w/ foot drop   Acute posthemorrhagic anemia   Elevated liver enzymes   Cardiac arrest with pulseless electrical activity (HCC)   Septic shock due to undetermined organism ([place])   Aspiration pneumonia of both lower lobes due to vomit ([place])   Severe protein-calorie malnutrition (HCC)   Ischemic enteritis versus colitis with ileus   R anterior knee wound after placement IO access   Ambulatory dysfunction (multifactorial) 2/2 rhabdomyolosis, underlying peripheral vascular disease and 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 bid for muscle stiffness/hypertonicity Increase Lyrica for neuropathic  as well as overall pain Change Oxy IR to 7.5 mg scheduled every 4 hours Continue PT and OT as tolerated-hopeful can begin [facility] accelerated therapy program on Monday 2/20 Please note that patient's rhabdomyolysis has reemerged with CK up to 3182 as of 2/16 and today 2/17 is down to 2182 ESR 128 on 2/16 and and today 2/17 is slightly elevated at 139 Urine  myoglobin pending although urinalysis abnormal increased red cells Possibly related to recent administration of daptomycin noting that on 2/12 last CK was normal at 95 Patient has persistent thrombocytosis that has actually worsened with IV fluid administration.  Discussed with attending and have asked Dr. Marin Olp to see for hematology consultation Likely will need to involve the rheumatologist.  No inpatient rheumatologist available so we will discuss over telephone with outpatient rheumatologist. Begin myositis work-up.  GGT slightly elevated so we will also add serum aldolase, will also check ANA with reflex, Sjogren syndrome a extractable nuclear antibody, anti-Jo1 antibody/IgG D-dimer elevated in the 3.5 range and this is likely from acute rhabdomyolosis.  AST slightly elevated.  Lower extremity venous duplex preliminary negative for DVT 2/18 MRI femur regarding possible inflammatory myositis  CK trending down      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HCC) Bcx negative Cxr possible atelectasis See additional documentation under PVD and recent rhabdomyolysis 2/18 ID changed antibiotics to p.o. Doxy/Augmentin        Peripheral vascular disease of LEFT lower extremity w/ foot drop MRI of the neuraxis  was unremarkable for acute process . As per the MRI of the lumbar spine, multiple fluid collections in the right ileo psoas muscle.  Dr Benny Lennert spoke with neurology and neuro surgery on 1/24, no surgical cause for his lower extremity weakness. He suggested that a psoas abscess or hematoma could cause lumbar plexus compression /irritation leading to secondary weakness.   IR consulted for aspiration, suggested any aspiration would predispose to infection/ not amenable for aspiration or drain placement. Did not advise aspiration.  MRI of the femur c/w rhabdomyolysis, with the differential including myositis related to connective tissue disease and polymyositis. No drainable abscess identified. pt unable to move the  left lower extremity from severe pain and weakness.  Orthopedics consulted, suggested its a neurological/ lumbar plexus ischemic injury.  Hoping for recovery with therapy evaluations.  Vitamin b12 and folate levels wnl. 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 The left toe-brachial index is abnormal.  2/18 vascular evaluated on 2/15 no acute vascular needs related to abnormal ABIs          Ischemic enteritis versus colitis with ileus General surgery consulted, diagnostic laparoscopy unremarkable 2/18 C. difficile PCR and GI panel negative      AKI (acute kidney injury)/dehydration secondary to persistent poor oral intake- (present on admission) with metabolic acidosis/rhabdomyolysis Acquired CRRT 1/15-1/19/2023. Nephrology had been following but renal function has recovered Current creatinine 1.64 with BUN 40 and GFR 52 Currently in a negative balance of 28 L Patient continues to have thrombocytosis which may be related to volume depletion.  Current platelet count 898,000 Continue IV fluids since appears to be volume depleted.  Patient reports he is drinking plenty of fluids. Through translator was able to determine that patient does not like the foods that have been sent to his room.  Soft diet changed to regular and translator obtained Spanish language diet and assisted patient in making choices regarding his food versus eating what was picked  out for him.     Aspiration pneumonia of both lower lobes due to vomit ([place])- (present on admission) Resolved Stable on room air   R anterior knee wound after placement IO access Abn ABIs/arterial flow contributing to slow healing Continue local wound care Superficial wound/eschar continues to degrade and expect will slough off per recommendations of vascular services and infectious disease team   Acute posthemorrhagic  anemia possiblly from ?psoas hematoma Stool fob + on 1/21 , suspected from nonischemic colitis Has been given a total of 2 units PRBCs this admission Current hemoglobin 9.3   Severe protein-calorie malnutrition (HCC) Nutrition Status: Nutrition Problem: Severe Malnutrition Etiology: acute illness (cardiac arrest, AKI, ileus) Signs/Symptoms: energy intake < or equal to 50% for > or equal to 5 days, percent weight loss (7.4% weight loss in less than 2 weeks) Percent weight loss: 7.4 % Interventions: Ensure Enlive (each supplement provides 350kcal and 20 grams of protein), Hormel Shake, MVI BMI 20.72 Therapist who has been taking care of patient for significant portion of hospitalization states patient appears to have lost weight.  If weights are accurate patient weighed 206 pounds on 1/15, on 2/15 weight 152.8 pound Continue regular diet along with protein supplementation and allow snacks   Septic shock due to undetermined organism ([place])- (present on admission) Sepsis physiology resolved   Cardiac arrest with pulseless electrical activity ([place]) S/p PEA arrest, s/p CPR x 20 minutes and Epi x 5. Echocardiogram 1/15 with preserved LV function Not on a statin   Elevated liver enzymes Mild transaminitis has context of reemergence of mild rhabdomyollosis Of note CT this admission shows no evidence of cirrhosis   Situational depression Patient experiencing depression prior to admission due to dissolution of his marriage Further worsening of depression symptoms based on current physical debility and lack of clarity on recovery Zoloft initiated on 2/15   Thrombocytosis Reactive or from infection   Hyponatremia Improving.  Likely 2/2 aki   Acute respiratory failure with hypoxia and hypercapnia ([place]) Patient intubated on 05/24/2021 and extubated on 05/28/2021 .  patient completed course of antibiotics for aspiration pneumonia  2/3 currently on room air           DVT prophylaxis:  Heparin Code Status: Full Family Communication: None at bedside Disposition Plan: SNF Status is: Inpatient Remains inpatient appropriate because: IV treatment needs SNF                LOS: 34 days   Time spent: 35 minutes    Nolberto Hanlon, MD [facility] Pager [phone]  If 7PM-7AM, please contact night-coverage 06/27/2021, 2:49 PM

## 2021-06-28 ENCOUNTER — Inpatient Hospital Stay (HOSPITAL_COMMUNITY): Payer: Self-pay

## 2021-06-28 LAB — COMPREHENSIVE METABOLIC PANEL
ALT: 19 U/L (ref 0–44)
AST: 42 U/L — ABNORMAL HIGH (ref 15–41)
Albumin: 2.5 g/dL — ABNORMAL LOW (ref 3.5–5.0)
Alkaline Phosphatase: 145 U/L — ABNORMAL HIGH (ref 38–126)
Anion gap: 12 (ref 5–15)
BUN: 19 mg/dL (ref 6–20)
CO2: 21 mmol/L — ABNORMAL LOW (ref 22–32)
Calcium: 9 mg/dL (ref 8.9–10.3)
Chloride: 103 mmol/L (ref 98–111)
Creatinine, Ser: 0.91 mg/dL (ref 0.61–1.24)
GFR, Estimated: >60 mL/min (ref >60)
Glucose, Bld: 95 mg/dL (ref 70–99)
Potassium: 3.2 mmol/L — ABNORMAL LOW (ref 3.5–5.1)
Sodium: 136 mmol/L (ref 135–145)
Total Bilirubin: 0.5 mg/dL (ref 0.3–1.2)
Total Protein: 7 g/dL (ref 6.5–8.1)

## 2021-06-28 LAB — CBC WITH DIFFERENTIAL/PLATELET
Abs Immature Granulocytes: 0.06 10*3/uL (ref 0.00–0.07)
Basophils Absolute: 0.1 10*3/uL (ref 0.0–0.1)
Basophils Relative: 1 %
Eosinophils Absolute: 0.2 10*3/uL (ref 0.0–0.5)
Eosinophils Relative: 3 %
HCT: 28.3 % — ABNORMAL LOW (ref 39.0–52.0)
Hemoglobin: 8.7 g/dL — ABNORMAL LOW (ref 13.0–17.0)
Immature Granulocytes: 1 %
Lymphocytes Relative: 38 %
Lymphs Abs: 3.5 10*3/uL (ref 0.7–4.0)
MCH: 26 pg (ref 26.0–34.0)
MCHC: 30.7 g/dL (ref 30.0–36.0)
MCV: 84.5 fL (ref 80.0–100.0)
Monocytes Absolute: 0.8 10*3/uL (ref 0.1–1.0)
Monocytes Relative: 9 %
Neutro Abs: 4.5 10*3/uL (ref 1.7–7.7)
Neutrophils Relative %: 48 %
Platelets: 735 10*3/uL — ABNORMAL HIGH (ref 150–400)
RBC: 3.35 MIL/uL — ABNORMAL LOW (ref 4.22–5.81)
RDW: 15.7 % — ABNORMAL HIGH (ref 11.5–15.5)
WBC: 9.1 10*3/uL (ref 4.0–10.5)
nRBC: 0 % (ref 0.0–0.2)

## 2021-06-28 LAB — CK: Total CK: 465 U/L — ABNORMAL HIGH (ref 49–397)

## 2021-06-28 LAB — ALDOLASE: Aldolase: 7.6 U/L (ref 3.3–10.3)

## 2021-06-28 LAB — SEDIMENTATION RATE: Sed Rate: 100 mm/hr — ABNORMAL HIGH (ref 0–16)

## 2021-06-28 MED ORDER — LORAZEPAM 2 MG/ML IJ SOLN
0.5000 mg | Freq: Once | INTRAMUSCULAR | Status: AC
  Administered 2021-06-28: 0.5 mg via INTRAVENOUS
  Filled 2021-06-28: qty 1

## 2021-06-28 MED ORDER — POTASSIUM CHLORIDE CRYS ER 20 MEQ PO TBCR
40.0000 meq | EXTENDED_RELEASE_TABLET | Freq: Once | ORAL | Status: AC
  Administered 2021-06-28: 40 meq via ORAL
  Filled 2021-06-28: qty 2

## 2021-06-28 NOTE — Plan of Care (Signed)
  Problem: Health Behavior/Discharge Planning: Goal: Ability to manage health-related needs will improve Outcome: Not Progressing   

## 2021-06-28 NOTE — Progress Notes (Signed)
The platelet count is coming down nicely.  Today the platelet count is 735,000.  His white cell count is 9.1.  Hemoglobin 8.7.  The CK is also down nicely.  It is down to 465.  His renal function also looks okay.  He received IV iron yesterday.  His hemoglobin is holding steady.  Again, the thrombocytosis is clearly iatrogenic.  I suspect is probably iron deficiency.  It is coming down nicely.  We will sign off.  Looks like his problem is going to be neurologic in will need a lot of rehab.  He is having leg issues.  I know he is being worked on with physical therapy.  Christin Bach, MD  Hebrews 12:12

## 2021-06-28 NOTE — Progress Notes (Signed)
PROGRESS NOTE    Andres Braun  WJX:[id number] DOB: Oct 23, 1975 DOA: 05/24/2021 PCP: Merryl Hacker, No    Brief Narrative:  46 year old male admitted to ICU 1/15 under PCCM care after cardiac arrest at home.  PEA arrest, s/p CPR x20 minutes and epi x5.  Patient reportedly out drinking with his friends the night prior to admission.  Found by friends with agonal breathing.  Intubated on arrival, needed vasopressors, noted to have renal failure, rhabdomyolysis with CK in the 14 K range, treated for aspiration pneumonia, nephrology consulted and started on CRRT.  Extubated 1/19 and off pressors.  Mental status slowly improved.  Transferred to telemetry.  Care transferred to [facility] on 1/24.  Nephrology following for HD needs.  General surgery on board for questionable small bowel ischemia and taken emergently to surgery 1/24. The bowel in question was found to be thickened, but viable. Hospital course complicated by unexplained left lower extremity weakness. Extensive work up of the neuraxis is negative for compressive pathology. Differential is probably a psoas hematoma causing lumbar plexus compression/ irritation leading to secondary weakness. IR suggested that it is not amenable for aspiration or drain placement. Neurology consulted, suggested not a neurological injury as the neuraxis is negative.   2/18 sleeping this am. C/o didn't sleep last night 2/19 c/o LLE pain still but was sleeping and when I woke him up he stated he cant sleep b/c of leg pain  Consultants:  Oncology, vascular  Procedures:   Antimicrobials:      Subjective: No sob, cp, abd pain  Objective: Vitals:   06/27/21 2023 06/28/21 0110 06/28/21 0430 06/28/21 0602  BP:  (!) 124/94 (!) 125/94   Pulse: (!) 110 (!) 107 (!) 105   Resp:  16 16   Temp:  98.9 F (37.2 C) 98.5 F (36.9 C)   TempSrc:  Oral Oral   SpO2: 99% 99% 98%   Weight:    67.6 kg  Height:        Intake/Output Summary (Last 24 hours) at 06/28/2021 0824 Last data  filed at 06/28/2021 0336 Gross per 24 hour  Intake 360 ml  Output 2350 ml  Net -1990 ml   Filed Weights   06/24/21 0552 06/27/21 0500 06/28/21 0602  Weight: 69.3 kg 68.5 kg 67.6 kg    Examination: Calm, NAD Cta no w/r Reg s1/s2 no gallop Soft benign +bs No edema Aaoxox3  Mood and affect appropriate in current setting     Data Reviewed: I have personally reviewed following labs and imaging studies  CBC: Recent Labs  Lab 06/23/21 0742 06/24/21 0724 06/25/21 1448 06/26/21 0508 06/27/21 0056 06/28/21 0327  WBC 11.0* 10.7*  --  9.8 10.6* 9.1  NEUTROABS  --   --   --  5.5 6.0 4.5  HGB 9.2* 9.3*  --  8.7* 9.0* 8.7*  HCT 30.6* 30.4*  --  27.9* 29.0* 28.3*  MCV 86.0 84.4  --  84.0 83.8 84.5  PLT 873* 898* 780* 823* 808* 213*   Basic Metabolic Panel: Recent Labs  Lab 06/22/21 0307 06/24/21 0724 06/25/21 1316 06/26/21 0508 06/27/21 0056 06/28/21 0327  NA 133* 133* 133* 134* 133* 136  K 4.4 3.7 3.6 3.3* 3.5 3.2*  CL 102 102 105 107 103 103  CO2 20* 19* 18* 18* 19* 21*  GLUCOSE 99 99 158* 126* 101* 95  BUN 39* 40* 36* 29* 21* 19  CREATININE 2.00* 1.64* 1.45* 1.18 1.01 0.91  CALCIUM 9.4 9.5 9.3 9.0 8.9 9.0  PHOS 3.7  --   --   --   --   --    GFR: Estimated Creatinine Clearance: 98 mL/min (by C-G formula based on SCr of 0.91 mg/dL). Liver Function Tests: Recent Labs  Lab 06/22/21 0307 06/26/21 0508 06/27/21 0056 06/28/21 0327  AST  --  80* 63* 42*  ALT  --  _0 ALKPHOS  --  161* 157* 145*  BILITOT  --  0.3 0.4 0.5  PROT  --  7.3 7.1 7.0  ALBUMIN 2.3* 2.4* 2.5* 2.5*   No results for input(s): LIPASE, AMYLASE in the last 168 hours. No results for input(s): AMMONIA in the last 168 hours. Coagulation Profile: Recent Labs  Lab 06/25/21 1448  INR 1.2   Cardiac Enzymes: Recent Labs  Lab 06/25/21 1316 06/26/21 0508 06/27/21 0056 06/28/21 0327  CKTOTAL 3,182* 2,185* 1,263* 465*   BNP (last 3 results) No results for input(s): PROBNP in the  last 8760 hours. HbA1C: No results for input(s): HGBA1C in the last 72 hours. CBG: No results for input(s): GLUCAP in the last 168 hours. Lipid Profile: No results for input(s): CHOL, HDL, LDLCALC, TRIG, CHOLHDL, LDLDIRECT in the last 72 hours. Thyroid Function Tests: No results for input(s): TSH, T4TOTAL, FREET4, T3FREE, THYROIDAB in the last 72 hours. Anemia Panel: No results for input(s): VITAMINB12, FOLATE, FERRITIN, TIBC, IRON, RETICCTPCT in the last 72 hours. Sepsis Labs: No results for input(s): PROCALCITON, LATICACIDVEN in the last 168 hours.  No results found for this or any previous visit (from the past 240 hour(s)).       Radiology Studies: MR [facility] RIGHT WO CONTRAST  Result Date: 06/27/2021 CLINICAL DATA:  suspected inflammatory myositis both legs EXAM: MRI OF THE RIGHT FEMUR WITHOUT CONTRAST TECHNIQUE: Multiplanar, multisequence MR imaging of the right femur was performed. No intravenous contrast was administered. COMPARISON:  Pelvis CT 06/13/2021. FINDINGS: Bones/Joint/Cartilage No evidence of acute fracture. There is partially visualized marrow edema within the proximal tibias bilaterally. There is a large heterogeneous right knee joint effusion. Muscles and Tendons There is extensive intramuscular edema of the right hip and thigh. Edema seen within the iliopsoas muscle, adductor muscles, vastus muscles in the anterior compartment. There is no focal intramuscular collection suggest to suggest myonecrosis or pyomyositis. There is interfascial fluid in the anterior compartment. On the large field-of-view images, there is also proximal intramuscular edema along the left hip and proximal thigh. Soft tissues There is subcutaneous soft tissue swelling along the lateral thigh. IMPRESSION: Extensive intramuscular edema of the proximal hips and thighs, with interfascial fluid proximally, and extending more distally in the right thigh in the anterior compartment. Subcutaneous edema  laterally. 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 No focal fluid collection to suggest myonecrosis or pyomyositis. Heterogeneous right knee joint effusion and partially visualized heterogeneous marrow signal within the proximal right tibia. Similar heterogeneous marrow signal within the left proximal tibia to a slightly lesser degree with small left knee effusion. These findings are nonspecific however concerning for possible septic arthritis of the right knee joint. The right knee joint effusion could potentially be reactive but heterogeneity is concerning. Consider joint aspiration of the right knee. Electronically Signed   By: Maurine Simmering M.D.   On: 06/27/2021 13:54   VAS [place] LOWER EXTREMITY VENOUS (DVT)  Result Date: 06/26/2021  Lower Venous DVT Study Patient Name:  [employer]  Date of Exam:   06/26/2021 Medical Rec #: [id number]  Accession #:    [id number] Date of Birth: April 02, 1976           Patient Gender: M Patient Age:   32 years Exam Location:  [facility] Procedure:      VAS [place] LOWER EXTREMITY VENOUS (DVT) Referring Phys: Erin Hearing --------------------------------------------------------------------------------  Indications: Unexplained thrombocytosis, and Pain.  Comparison Study: 06/02/21 prior Performing Technologist: Archie Patten RVS  Examination Guidelines: A complete evaluation includes B-mode imaging, spectral Doppler, color Doppler, and power Doppler as needed of all accessible portions of each vessel. Bilateral testing is considered an integral part of a complete examination. Limited examinations for reoccurring indications may be performed as noted. The reflux portion of the exam is performed with the patient in reverse Trendelenburg.  +---------+---------------+---------+-----------+----------+--------------+  RIGHT     Compressibility Phasicity Spontaneity Properties Thrombus Aging   +---------+---------------+---------+-----------+----------+--------------+  CFV       Full            Yes       Yes                                    +---------+---------------+---------+-----------+----------+--------------+  SFJ       Full                                                             +---------+---------------+---------+-----------+----------+--------------+  FV Prox   Full                                                             +---------+---------------+---------+-----------+----------+--------------+  FV Mid    Full                                                             +---------+---------------+---------+-----------+----------+--------------+  FV Distal Full                                                             +---------+---------------+---------+-----------+----------+--------------+  PFV       Full                                                             +---------+---------------+---------+-----------+----------+--------------+  POP       Full            Yes       Yes                                    +---------+---------------+---------+-----------+----------+--------------+  PTV       Full                                                             +---------+---------------+---------+-----------+----------+--------------+  PERO      Full                                                             +---------+---------------+---------+-----------+----------+--------------+   +---------+---------------+---------+-----------+----------+--------------+  LEFT      Compressibility Phasicity Spontaneity Properties Thrombus Aging  +---------+---------------+---------+-----------+----------+--------------+  CFV       Full            Yes       Yes                                    +---------+---------------+---------+-----------+----------+--------------+  SFJ       Full                                                              +---------+---------------+---------+-----------+----------+--------------+  FV Prox   Full                                                             +---------+---------------+---------+-----------+----------+--------------+  FV Mid    Full                                                             +---------+---------------+---------+-----------+----------+--------------+  FV Distal Full                                                             +---------+---------------+---------+-----------+----------+--------------+  PFV       Full                                                             +---------+---------------+---------+-----------+----------+--------------+  POP       Full            Yes       Yes                                    +---------+---------------+---------+-----------+----------+--------------+  PTV       Full                                                             +---------+---------------+---------+-----------+----------+--------------+  PERO      Full                                                             +---------+---------------+---------+-----------+----------+--------------+     Summary: BILATERAL: - No evidence of deep vein thrombosis seen in the lower extremities, bilaterally. -No evidence of popliteal cyst, bilaterally.   *See table(s) above for measurements and observations. Electronically signed by Orlie Pollen on 06/26/2021 at 6:28:20 PM.    Final         Scheduled Meds:  (feeding supplement) PROSource Plus  30 mL Oral TID BM   amLODipine  5 mg Oral Daily   amoxicillin-clavulanate  1 tablet Oral Q12H   Chlorhexidine Gluconate Cloth  6 each Topical Daily   collagenase   Topical BID   docusate sodium  100 mg Oral BID   doxycycline  100 mg Oral Q12H   feeding supplement  1 Container Oral TID BM   folic acid  1 mg Per Tube Daily   Gerhardt's butt cream   Topical TID   heparin injection (subcutaneous)  5,000 Units Subcutaneous Q8H   lidocaine  1  patch Transdermal Q24H   methocarbamol  500 mg Oral Q12H   multivitamin  1 tablet Oral QHS   oxyCODONE  7.5 mg Oral Q4H   pantoprazole  40 mg Oral BID   polyethylene glycol  17 g Oral Daily   potassium chloride  40 mEq Oral Once   pregabalin  50 mg Oral TID   sertraline  25 mg Oral QHS   sodium bicarbonate  650 mg Oral Daily   Continuous Infusions:  sodium chloride 10 mL/hr at 06/25/21 1725   lactated ringers 150 mL/hr at 06/28/21 0440    Assessment & Plan:   Active Problems:   AKI (acute kidney injury)/dehydration secondary to persistent poor oral intake   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HCC)   Peripheral vascular disease of LEFT lower extremity w/ foot drop   Acute posthemorrhagic anemia   Elevated liver enzymes   Cardiac arrest with pulseless electrical activity (HCC)   Septic shock due to undetermined organism ([place])   Aspiration pneumonia of both lower lobes due to vomit ([place])   Severe protein-calorie malnutrition (HCC)   Ischemic enteritis versus colitis with ileus   R anterior knee wound after placement IO access   Ambulatory dysfunction (multifactorial) 2/2 rhabdomyolosis, underlying peripheral vascular disease and 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 bid for muscle stiffness/hypertonicity Increase Lyrica for neuropathic as well as overall pain Change Oxy IR to 7.5 mg scheduled every 4 hours Continue PT and OT as tolerated-hopeful can begin [facility] accelerated therapy program on Monday  2/20 Please note that patient's rhabdomyolysis has reemerged with CK up to 3182 as of 2/16 and today 2/17 is down to 2182 ESR 128 on 2/16 and and today 2/17 is slightly elevated at 139 Urine myoglobin pending although urinalysis abnormal increased red cells Possibly related to recent administration of daptomycin noting that on 2/12 last CK was normal at 95 Patient has persistent thrombocytosis that has actually worsened with IV fluid administration.  Discussed with attending and have asked Dr. Marin Olp to see for hematology consultation Likely will need to involve the rheumatologist.  No inpatient rheumatologist available so we will discuss over telephone with outpatient rheumatologist. Begin myositis work-up.  GGT slightly elevated so we will also add serum aldolase, will also check ANA with reflex, Sjogren syndrome a extractable nuclear antibody, anti-Jo1 antibody/IgG D-dimer elevated in the 3.5 range and this is likely from acute rhabdomyolosis.  AST slightly elevated.  Lower extremity venous duplex preliminary negative for DVT 2/18 MRI femur regarding possible inflammatory myositis  2/19 radiology will try MRI of LLE today     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[place]) Bcx negative Cxr possible atelectasis See additional documentation under PVD and recent rhabdomyolysis 2/19 ID change antibiotics to p.o. doxycycline/ Augmentin         Peripheral vascular disease of LEFT lower extremity w/ foot drop MRI of the neuraxis  was unremarkable for acute process . As per the MRI of the lumbar spine, multiple fluid collections in the right ileo psoas muscle.  Dr Benny Lennert spoke with neurology and neuro surgery on 1/24, no surgical cause for his lower extremity weakness. He suggested that a psoas abscess or hematoma could cause lumbar plexus compression /irritation leading to secondary weakness.   IR consulted for aspiration, suggested any aspiration would predispose to infection/ not amenable for aspiration or drain  placement. Did not advise aspiration.  MRI of the femur c/w rhabdomyolysis, with the differential including myositis related to connective tissue disease and polymyositis. No drainable abscess identified. pt unable to move the left lower extremity from severe pain and weakness.  Orthopedics consulted, suggested its a neurological/ lumbar plexus ischemic injury.  Hoping for recovery with therapy evaluations.  Vitamin b12 and folate levels wnl. 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 The left toe-brachial index is abnormal.  2/19 vascular evaluated on 2/15 no acute vascular needs related to abnormal ABIs             Ischemic enteritis versus colitis with ileus General surgery consulted, diagnostic laparoscopy unremarkable 2/19 C. difficile PCR and GI panel negative       AKI (acute kidney injury)/dehydration secondary to persistent poor oral intake- (present on admission) with metabolic acidosis/rhabdomyolysis Acquired CRRT 1/15-1/19/2023. Nephrology had been following but renal function has recovered Current creatinine 1.64 with BUN 40 and GFR 52 Currently in a negative balance of 28 L Patient continues to have thrombocytosis which may be related to volume depletion.  Current platelet count 898,000 Continue IV fluids since appears to be volume depleted.  Patient reports he is drinking plenty of fluids. Through translator was able to determine that patient does not like the foods that have been sent to his room.  Soft diet changed to regular and  translator obtained Spanish language diet and assisted patient in making choices regarding his food versus eating what was picked out for him.     Aspiration pneumonia of both lower lobes due to vomit ([place])- (present on admission) Resolved Stable on room air   R anterior knee wound after placement IO access Abn ABIs/arterial flow  contributing to slow healing Continue local wound care Superficial wound/eschar continues to degrade and expect will slough off per recommendations of vascular services and infectious disease team   Acute posthemorrhagic anemia possiblly from ?psoas hematoma Stool fob + on 1/21 , suspected from nonischemic colitis Has been given a total of 2 units PRBCs this admission Current hemoglobin 9.3   Severe protein-calorie malnutrition (HCC) Nutrition Status: Nutrition Problem: Severe Malnutrition Etiology: acute illness (cardiac arrest, AKI, ileus) Signs/Symptoms: energy intake < or equal to 50% for > or equal to 5 days, percent weight loss (7.4% weight loss in less than 2 weeks) Percent weight loss: 7.4 % Interventions: Ensure Enlive (each supplement provides 350kcal and 20 grams of protein), Hormel Shake, MVI BMI 20.72 Therapist who has been taking care of patient for significant portion of hospitalization states patient appears to have lost weight.  If weights are accurate patient weighed 206 pounds on 1/15, on 2/15 weight 152.8 pound Continue regular diet along with protein supplementation and allow snacks   Septic shock due to undetermined organism ([place])- (present on admission) Sepsis physiology resolved   Cardiac arrest with pulseless electrical activity ([place]) S/p PEA arrest, s/p CPR x 20 minutes and Epi x 5. Echocardiogram 1/15 with preserved LV function Not on a statin   Elevated liver enzymes Mild transaminitis has context of reemergence of mild rhabdomyollosis Of note CT this admission shows no evidence of cirrhosis   Situational depression Patient experiencing depression prior to admission due to dissolution of his marriage Further worsening of depression symptoms based on current physical debility and lack of clarity on recovery Zoloft initiated on 2/15   Thrombocytosis Reactive or from infection   Hyponatremia Improving.  Likely 2/2 aki   Acute respiratory failure  with hypoxia and hypercapnia ([place]) Patient intubated on 05/24/2021 and extubated on 05/28/2021 .  patient completed course of antibiotics for aspiration pneumonia  2/3 currently on room air           DVT prophylaxis: Heparin Code Status: Full Family Communication: None at bedside Disposition Plan: SNF Status is: Inpatient Remains inpatient appropriate because: IV treatment needs SNF                LOS: 35 days   Time spent: 35 minutes    Nolberto Hanlon, MD [facility] Pager [phone]  If 7PM-7AM, please contact night-coverage 06/28/2021, 8:24 AM

## 2021-06-29 LAB — CBC WITH DIFFERENTIAL/PLATELET
Abs Immature Granulocytes: 0.09 10*3/uL — ABNORMAL HIGH (ref 0.00–0.07)
Basophils Absolute: 0.1 10*3/uL (ref 0.0–0.1)
Basophils Relative: 1 %
Eosinophils Absolute: 0.2 10*3/uL (ref 0.0–0.5)
Eosinophils Relative: 2 %
HCT: 29.1 % — ABNORMAL LOW (ref 39.0–52.0)
Hemoglobin: 8.9 g/dL — ABNORMAL LOW (ref 13.0–17.0)
Immature Granulocytes: 1 %
Lymphocytes Relative: 26 %
Lymphs Abs: 3.3 10*3/uL (ref 0.7–4.0)
MCH: 25.9 pg — ABNORMAL LOW (ref 26.0–34.0)
MCHC: 30.6 g/dL (ref 30.0–36.0)
MCV: 84.6 fL (ref 80.0–100.0)
Monocytes Absolute: 0.9 10*3/uL (ref 0.1–1.0)
Monocytes Relative: 7 %
Neutro Abs: 8.3 10*3/uL — ABNORMAL HIGH (ref 1.7–7.7)
Neutrophils Relative %: 63 %
Platelets: 728 10*3/uL — ABNORMAL HIGH (ref 150–400)
RBC: 3.44 MIL/uL — ABNORMAL LOW (ref 4.22–5.81)
RDW: 15.8 % — ABNORMAL HIGH (ref 11.5–15.5)
WBC: 12.9 10*3/uL — ABNORMAL HIGH (ref 4.0–10.5)
nRBC: 0 % (ref 0.0–0.2)

## 2021-06-29 LAB — COMPREHENSIVE METABOLIC PANEL
ALT: 17 U/L (ref 0–44)
AST: 31 U/L (ref 15–41)
Albumin: 2.5 g/dL — ABNORMAL LOW (ref 3.5–5.0)
Alkaline Phosphatase: 139 U/L — ABNORMAL HIGH (ref 38–126)
Anion gap: 11 (ref 5–15)
BUN: 18 mg/dL (ref 6–20)
CO2: 21 mmol/L — ABNORMAL LOW (ref 22–32)
Calcium: 9.1 mg/dL (ref 8.9–10.3)
Chloride: 102 mmol/L (ref 98–111)
Creatinine, Ser: 0.85 mg/dL (ref 0.61–1.24)
GFR, Estimated: >60 mL/min (ref >60)
Glucose, Bld: 106 mg/dL — ABNORMAL HIGH (ref 70–99)
Potassium: 3.8 mmol/L (ref 3.5–5.1)
Sodium: 134 mmol/L — ABNORMAL LOW (ref 135–145)
Total Bilirubin: 0.5 mg/dL (ref 0.3–1.2)
Total Protein: 6.9 g/dL (ref 6.5–8.1)

## 2021-06-29 LAB — CK: Total CK: 215 U/L (ref 49–397)

## 2021-06-29 LAB — SEDIMENTATION RATE: Sed Rate: 97 mm/hr — ABNORMAL HIGH (ref 0–16)

## 2021-06-29 LAB — MYOGLOBIN, URINE: Myoglobin, Ur: 6668 ng/mL — ABNORMAL HIGH (ref 0–13)

## 2021-06-29 MED ORDER — PREDNISONE 50 MG PO TABS
50.0000 mg | ORAL_TABLET | Freq: Every day | ORAL | Status: AC
  Administered 2021-06-29 – 2021-07-03 (×5): 50 mg via ORAL
  Filled 2021-06-29 (×5): qty 1

## 2021-06-29 NOTE — Plan of Care (Signed)
°  Problem: Nutrition: Goal: Adequate nutrition will be maintained Outcome: Not Progressing   Note: Patient eating less than 25% of meals and mostly just fruit.  Patient refusing supplemental nutrition drinks.  The patient stated the supplemental nutrition drinks hurt his stomach.   Used the translator to assist patient in placing dinner order, patient is unable to read the [employer].  Patient states that everything tastes the same and/or bad to him and he only wanted fruit and chicken broth for dinner.   Messaged dietician to inform.

## 2021-06-29 NOTE — Discharge Planning (Addendum)
Oncology Discharge Planning Admission Note  [facility] Address: [address] [place], [place], [place] [address] Hours of Operation:  8am - 5pm, Monday - Friday  Clinic Contact Information:  [phone]) [phone]  Oncology Care Team: Medical Oncologist:  Dr. Myna Hidalgo by consult  Dr. Myna Hidalgo is aware of this hospital admission dated 05/24/21 and has been following patients IP progress. The cancer center will not be following post discharge.  Disclaimer:  This Cancer Center nursing note does not imply a formal consult request has been made by the admitting attending for this admission or there will be an inpatient consult completed by oncology.  Please request oncology consults as per standard process as indicated.

## 2021-06-29 NOTE — Plan of Care (Signed)
Noted AKI resolved.  Nephrology signed off earlier this admission.  No need for nephrology follow-up at this time.    Estanislado Emms, MD 5:19 PM 06/29/2021

## 2021-06-29 NOTE — Progress Notes (Signed)
Occupational Therapy Treatment Patient Details Name: Andres Braun MRN: [id number] DOB: 06-10-1975 Today's Date: 06/29/2021   History of present illness Pt is a 46 y.o. male admitted 05/24/21 after cardiac arrest at home; pt with agonal breathing, lost pulse, required 20-min CPR and 5 rounds epi. Workup for acute hypoxic respiratory failure, septic shock due to undertermined organism, AKI, acute metabolic encephalopathy, aspiration PNA of bilateral lower lobes due to vomit. ETT 1/15-1/19. CRRT 1/15-1/20. Brain MRI 1/22 negative. Cervical MRI 1/22 with small R-side disc protrusion C5-6. Abdominal CT showed muscular swelling, particularly iliopsoas and gluteal muscles consistent with rhabdomyolysis; hemorrhage noted within R iliopsoas. S/p diagnostic laparoscopy 1/24. MRI L femur 1/25 showed diffuse muscular abnormalities bilateral thighs with heterogenous T2 signal, especially within L glut max and gradratus femoris; findings compatible with dx of rhabdomyolysis, no drainable abscess identified; these findings could contribute to compartment syndrome. 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  Discussed STAR program and patient stated he agreed to participate and was eager to discharge from hospital. Patient performed squat pivot to [facility] with min assist and mod assist +2 for transfer to recliner. Patient stood at sink for grooming tasks and stated he felt dizzy.  BP checked seated following standing at 126/100.  Patient stood again from recliner to RW and BP checked while standing at 101/88 and when returned to recliner and checked again seated at 125/103.  Patient to continue with STAR program.    Recommendations for follow up therapy are one component of a multi-disciplinary discharge planning  process, led by the attending physician.  Recommendations may be updated based on patient status, additional functional criteria and insurance authorization.    Follow Up Recommendations  Skilled nursing-short term rehab (<3 hours/day)    Assistance Recommended at Discharge Frequent or constant Supervision/Assistance  Patient can return home with the following  A lot of help with walking and/or transfers;A lot of help with bathing/dressing/bathroom;Assistance with cooking/housework;Assist for transportation;Direct supervision/assist for financial management;Direct supervision/assist for medications management;Help with stairs or ramp for entrance   Equipment Recommendations  BSC/3in1;Wheelchair (measurements OT);Wheelchair cushion (measurements OT)    Recommendations for Other Services      Precautions / Restrictions Precautions Precautions: Fall;Other (comment) Precaution Comments: symptomatic orthostatic hypotension with standing Restrictions Weight Bearing Restrictions: No       Mobility Bed Mobility                    Transfers                         Balance                                           ADL either performed or assessed with clinical judgement   ADL Overall ADL's : Needs assistance/impaired     Grooming: Wash/dry face;Minimal assistance;Standing Grooming Details (indicate cue type and reason): stood at sink with min assist due to reliant on LUE support                 Toilet Transfer: Minimal Loss adjuster, chartered Details (indicate cue type and reason): min assist for squat pivot to drop  arm from EOB and mod assist +2 to transfer to recliner from [place] ADL Comments: Patient stood at sink from recliner with mod assist +2 for balance to perform grooming tasks    Extremity/Trunk Assessment Upper Extremity Assessment RUE Coordination: decreased fine motor;decreased  gross motor            Vision       Perception     Praxis      Cognition                                                Exercises      Shoulder Instructions       General Comments Orthostatic vitals taken (please see OT note)    Pertinent Vitals/ Pain          Home Living                                          Prior Functioning/Environment              Frequency  Min 2X/week        Progress Toward Goals  OT Goals(current goals can now be found in the care plan section)  Progress towards OT goals: Progressing toward goals  Acute Rehab OT Goals Patient Stated Goal: go home OT Goal Formulation: With patient Time For Goal Achievement: 07/10/21 Potential to Achieve Goals: Good ADL Goals Pt Will Perform Grooming: with set-up;with supervision;sitting Pt Will Perform Upper Body Bathing: with set-up;with supervision;sitting Pt Will Perform Lower Body Bathing: with min assist;with adaptive equipment;sitting/lateral leans Pt Will Transfer to Toilet: with min assist;squat pivot transfer;bedside commode Pt Will Perform Toileting - Clothing Manipulation and hygiene: with min assist;sitting/lateral leans Additional ADL Goal #1: Pt will be Min A in and OOB for basic ADLs Additional ADL Goal #2: pt will complete bed level bed pain mod I with flat bed surface Additional ADL Goal #3: pt will complete w/c transfer mod I  Plan Discharge plan remains appropriate    Co-evaluation    PT/OT/SLP Co-Evaluation/Treatment: Yes Reason for Co-Treatment: For patient/therapist safety;To address functional/ADL transfers PT goals addressed during session: Strengthening/ROM OT goals addressed during session: ADL's and self-care      AM-PAC OT "6 Clicks" Daily Activity     Outcome Measure   Help from another person eating meals?: None Help from another person taking care of personal grooming?: A Little Help from another person  toileting, which includes using toliet, bedpan, or urinal?: A Lot Help from another person bathing (including washing, rinsing, drying)?: A Lot Help from another person to put on and taking off regular upper body clothing?: A Little Help from another person to put on and taking off regular lower body clothing?: A Lot 6 Click Score: 16    End of Session Equipment Utilized During Treatment: Gait belt;Rolling walker (2 wheels)  OT Visit Diagnosis: Unsteadiness on feet (R26.81);Other abnormalities of gait and mobility (R26.89);Muscle weakness (generalized) (M62.81);Other symptoms and signs involving cognitive function;Pain Pain - Right/Left: Left Pain - part of body: Hip   Activity Tolerance Patient limited by pain   Patient Left in chair;with call bell/phone within reach;with chair alarm set   Nurse Communication Mobility  status;Need for lift equipment        Time: 0903-1000 OT Time Calculation (min): 57 min  Charges: OT General Charges $OT Visit: 1 Visit OT Treatments $Self Care/Home Management : 8-22 mins $Therapeutic Activity: 8-22 mins  Lodema Hong, OTA Acute Rehabilitation Services  Pager [phone] Office [place] 06/29/2021, 12:51 PM

## 2021-06-29 NOTE — Progress Notes (Signed)
Progress Note   Patient: Andres Braun [id number] DOB: 03-Aug-1975 DOA: 05/24/2021     36 DOS: the patient was seen and examined on 06/29/2021   Brief hospital course: 46 year old male admitted to ICU 1/15 under PCCM care after cardiac arrest at home.  PEA arrest, s/p CPR x20 minutes and epi x5.  Patient reportedly out drinking with his friends the night prior to admission.  Found by friends with agonal breathing.  Intubated on arrival, needed vasopressors, noted to have renal failure, rhabdomyolysis with CK in the 14 K range, treated for aspiration pneumonia, nephrology consulted and started on CRRT.  Extubated 1/19 and off pressors.  Mental status slowly improved.  Transferred to telemetry.  Care transferred to [facility] on 1/24.  Nephrology following for HD needs.  General surgery on board for questionable small bowel ischemia and taken emergently to surgery 1/24. The bowel in question was found to be thickened, but viable. Hospital course complicated by unexplained left lower extremity weakness. Extensive work up of the neuraxis is negative for compressive pathology. Differential is probably a psoas hematoma causing lumbar plexus compression/ irritation leading to secondary weakness. IR suggested that it is not amenable for aspiration or drain placement. Neurology consulted, suggested not a neurological injury as the neuraxis is negative.    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 bid for muscle stiffness/hypertonicity Increase Lyrica for neuropathic as well as overall  pain Change Oxy IR to 7.5 mg scheduled every 4 hours Continue PT and OT as tolerated-hopeful can begin STAR accelerated therapy program on Monday 2/20 Please note that patient's rhabdomyolysis and as of 2/20 CK/ESR continues to trend down Urine myoglobin pending although urinalysis abnormal increased red cells Possibly related to recent administration of daptomycin noting that on 2/12 last CK was normal at 95 Persistent thrombocytosis -hematology suspects reactive 2/2 iron deficiency-given IV iron-hematology has s/o MRI B thighs c/w 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  AST slightly elevated.  Lower extremity venous duplex preliminary negative for DVT   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HCC) Bcx negative Cxr possible atelectasis See additional documentation under PVD and recent rhabdomyolysis ID has changed anbxs to PO doxy/augmentin. LD anbxs 3/3/2-23   Peripheral vascular disease of LEFT lower extremity w/ foot drop MRI of the neuraxis  was unremarkable for acute process . As per the MRI of the lumbar spine, multiple fluid collections in the right ileo psoas muscle.  Dr Benny Lennert spoke with neurology and neuro surgery on 1/24, no surgical cause for his lower extremity weakness. He suggested that a psoas abscess or hematoma could cause lumbar plexus compression /irritation leading to secondary weakness.   IR consulted for aspiration, suggested any aspiration would predispose to infection/ not amenable for aspiration or drain placement. Did not advise aspiration.  MRI of the femur c/w rhabdomyolysis, with the differential including myositis related to connective tissue disease and polymyositis. No drainable abscess identified. pt unable to move the left lower extremity from severe pain and weakness.  Orthopedics consulted, suggested its a  neurological/ lumbar plexus ischemic  injury.  Hoping for recovery with therapy evaluations.  Vitamin b12 and folate levels wnl. 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 The left toe-brachial index is abnormal.  Vascular evaluated on 2/15 and no acute vascular needs related to abnormal ABIs      Ischemic enteritis versus colitis with ileus-resolved as of 06/29/2021 General surgery consulted, diagnostic laparoscopy unremarkable  C. difficile PCR and GI panel negative    AKI (acute kidney injury)/dehydration secondary to persistent poor oral intake- (present on admission) with metabolic acidosis/rhabdomyolysis Acquired CRRT 1/15-1/19/2023. Nephrology had been following but renal function has recovered Current creatinine 1.64 with BUN 40 and GFR 52 Currently in a negative balance of 28 L Patient continues to have thrombocytosis which may be related to volume depletion.  Current platelet count 898,000 Continue IV fluids since appears to be volume depleted.  Patient reports he is drinking plenty of fluids. Through translator was able to determine that patient does not like the foods that have been sent to his room.  Soft diet changed to regular and translator obtained Spanish language diet and assisted patient in making choices regarding his food versus eating what was picked out for him.   Aspiration pneumonia of both lower lobes due to vomit ([place])- (present on admission) Resolved Stable on room air  R anterior knee wound after placement IO access  Abn ABIs/arterial flow contributing to slow healing Continue local wound care Superficial wound/eschar continues to degrade and expect will slough off per recommendations of vascular services and infectious disease team  Acute posthemorrhagic anemia possiblly from ?psoas hematoma Stool fob + on 1/21 , suspected from nonischemic  colitis Has been given a total of 2 units PRBCs this admission Current hemoglobin 9.3  Severe protein-calorie malnutrition (HCC) Nutrition Status: Nutrition Problem: Severe Malnutrition Etiology: acute illness (cardiac arrest, AKI, ileus) Signs/Symptoms: energy intake < or equal to 50% for > or equal to 5 days, percent weight loss (7.4% weight loss in less than 2 weeks) Percent weight loss: 7.4 % Interventions: Ensure Enlive (each supplement provides 350kcal and 20 grams of protein), Hormel Shake, MVI BMI 20.72 Therapist who has been taking care of patient for significant portion of hospitalization states patient appears to have lost weight.  If weights are accurate patient weighed 206 pounds on 1/15, on 2/15 weight 152.8 pound Continue regular diet along with protein supplementation and allow snacks  Septic shock due to undetermined organism (HCC)-resolved as of 06/29/2021, (present on admission) Sepsis physiology resolved  Cardiac arrest with pulseless electrical activity ([place]) S/p PEA arrest, s/p CPR x 20 minutes and Epi x 5. Echocardiogram 1/15 with preserved LV function Not on a statin  Elevated liver enzymes Mild transaminitis has context of reemergence of mild rhabdomyollosis Of note CT this admission shows no evidence of cirrhosis  Situational depression Patient experiencing depression prior to admission due to dissolution of his marriage Further worsening of depression symptoms based on current physical debility and lack of clarity on recovery Zoloft initiated on 2/15  Thrombocytosis Reactive or from infection  Hyponatremia Improving.  Likely 2/2 aki  Acute respiratory failure with hypoxia and hypercapnia ([place]) Patient intubated on 05/24/2021 and extubated on 05/28/2021 .  patient completed course of antibiotics for aspiration pneumonia  2/3 currently on room air        Subjective:  Continues to have significant pain in legs emanating from hips to toes.  Still  remains weak and having significant  difficulty moving legs primarily due to pain.  Physical Exam: Vitals:   06/28/21 2116 06/29/21 0101 06/29/21 0433 06/29/21 0800  BP: (!) 137/98 (!) 134/91 (!) 132/99 (!) 140/96  Pulse: (!) 112 (!) 126 (!) 114 (!) 112  Resp: _0 Temp: 98.5 F (36.9 C) 99.7 F (37.6 C) 98.5 F (36.9 C) 98.4 F (36.9 C)  TempSrc: Oral Oral Oral Oral  SpO2: 100% 98% 99% 98%  Weight:      Height:       General:  Pleasantly resting in bed, NAD Lungs:  Clear to auscultate bilaterally -room air-normal work of breathing Heart:  Regular rate and rhythm.  Without murmurs, rubs, or gallops. Abdomen:  Soft, nontender, nondistended.  Hypoactive bowel sounds LBM 2/19 Vascular: Weak DP/pedal pulses bilaterally. Neurological: Cranial nerves II through XII grossly intact, moves all extremities x4.  Upper extremity strength 4-5/5.  Essentially unable to move lower extremities or lift either leg off the bed.  ROM applied to both legs by bending knees revealed reports of pain primarily at the anterior thigh level as well as hip flexor pain.  Used to report generalized numbness of lower extremities which is worse on the plantar surfaces of the feet. Skin: Right lower extremity anterior wound from previous IO placement  Data Reviewed:  All results have been reviewed and noted within the progress note documentation  Family Communication:  Patient only utilizing Spanish language translator  Disposition: Remains inpatient appropriate because:  Unsafe discharge plan Barriers to discharge: Patient will need to discharge to the home environment and based on current PT evaluation is not quite ready to discharge and maintain independence in a home setting.  He will not have 24/7 assistance once discharged.  Currently working on wheelchair transfers and other home-based activities.  His training has been initiated on 2/16   Planned Discharge Destination:  Barriers to discharge:  Patient non-English-speaking and does not have payer source for rehabilitative therapies such as SNF and currently is not able to independently mobilize   A physical therapy consult is indicated based on the patients mobility assessment.   Mobility Assessment (last 72 hours)     Mobility Assessment     Row Name 06/28/21 1941 06/28/21 0800 06/27/21 0800   Does patient have an order for bedrest or is patient medically unstable No - Continue assessment No - Continue assessment No - Continue assessment   What is the highest level of mobility based on the progressive mobility assessment? Level 2 ([place]) - Balance while sitting on edge of bed and cannot stand Level 1 (Bedfast) - Unable to balance while sitting on edge of bed Level 1 (Bedfast) - Unable to balance while sitting on edge of bed   Is the above level different from baseline mobility prior to current illness? Yes - Recommend PT order Yes - Recommend PT order Yes - Recommend PT order    [place] Name 06/26/21 1945       Does patient have an order for bedrest or is patient medically unstable No - Continue assessment     What is the highest level of mobility based on the progressive mobility assessment? Level 1 (Bedfast) - Unable to balance while sitting on edge of bed     Is the above level different from baseline mobility prior to current illness? Yes - Recommend PT order             Medically stable Yes  COVID vaccination status:  Unknown  Consultants:  Nephrology Neurology General surgery Orthopedic Infectious disease Procedures: Echocardiogram EEG Core track Temporary dialysis catheter Laparoscopic evaluation of abdomen in context of evaluation for ischemic bowel Antibiotics: Ceftriaxone 1/15 through 1/19 Ceftriaxone 1/21 through 1/23 Flagyl 1/21 through 1/23 Zosyn 1/24 through 1/26 Ancef 1/27 through 1/31 Vancomycin x1 dose on 2/1 Daptomycin 2/3 through 2/15 Cefepime 2/4 through 2/15 Doxycycline and  Augmentin 2/15 with last doses due 3/3      Time spent: 25 minutes  Author: Erin Hearing, NP 06/29/2021 11:28 AM  For on call review [email or web address].

## 2021-06-29 NOTE — Progress Notes (Signed)
Physical Therapy Treatment Patient Details Name: Andres Braun MRN: [id number] DOB: [birth year] Today's Date: 06/29/2021   History of Present Illness Pt is a 46 y.o. male admitted 05/24/21 after cardiac arrest at home; pt with agonal breathing, lost pulse, required 20-min CPR and 5 rounds epi. Workup for acute hypoxic respiratory failure, septic shock due to undertermined organism, AKI, acute metabolic encephalopathy, aspiration PNA of bilateral lower lobes due to vomit. ETT 1/15-1/19. CRRT 1/15-1/20. Brain MRI 1/22 negative. Cervical MRI 1/22 with small R-side disc protrusion C5-6. Abdominal CT showed muscular swelling, particularly iliopsoas and gluteal muscles consistent with rhabdomyolysis; hemorrhage noted within R iliopsoas. S/p diagnostic laparoscopy 1/24. MRI L femur 1/25 showed diffuse muscular abnormalities bilateral thighs with heterogenous T2 signal, especially within L glut max and gradratus femoris; findings compatible with dx of rhabdomyolysis, no drainable abscess identified; these findings could contribute to compartment syndrome. 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 Pt in agreement. Inquired about assist at boss' home and will need to be independent throughout the day. Pt premedicated, however still reports L LE pain. Despite pain pt able to progress mobility to be able to squat pivot transfer bed>BSC>recliner. And the perform sit>stand at sink for self care with min-modAx2. Pt reports dizziness with self care. Drop in BP with orthostatics monitoring (BP values in OT note) however improved upright tolerance. Pt reports he would like to work on getting back to ambulation. Hopeful, steroids course will  decrease pain from myositis so mobility can be progressed.      Recommendations for follow up therapy are one component of a multi-disciplinary discharge planning process, led by the attending physician.  Recommendations may be updated based on patient status, additional functional criteria and insurance authorization.  Follow Up Recommendations  Skilled nursing-short term rehab (<3 hours/day)     Assistance Recommended at Discharge Frequent or constant Supervision/Assistance  Patient can return home with the following A lot of help with walking and/or transfers;Assistance with cooking/housework;Direct supervision/assist for medications management;Direct supervision/assist for financial management;Assist for transportation;Help with stairs or ramp for entrance;A lot of help with bathing/dressing/bathroom   Equipment Recommendations  Wheelchair (measurements PT);Wheelchair cushion (measurements PT);BSC/3in1;Hospital bed (drop arm BSC,)       Precautions / Restrictions Precautions Precautions: Fall;Other (comment) Precaution Comments: symptomatic orthostatic hypotension with standing Restrictions Weight Bearing Restrictions: No     Mobility  Bed Mobility Overal bed mobility: Needs Assistance Bed Mobility: Supine to Sit, Sit to Supine     Supine to sit: Min guard     General bed mobility comments: min guard and heavy use of bed rails to come to seated EoB    Transfers Overall transfer level: Needs assistance   Transfers: Bed to chair/wheelchair/BSC Sit to Stand: Mod assist, +2 physical assistance, Min assist     Squat pivot transfers: Min assist, Mod assist, +2 physical assistance     General transfer comment: min A for squat pivot transfer to drop arm BSC on pt R, modAx2 for squat pivot from BSC to drop arm recliner on R due to R knee buckling, decreased L LE weightbearing through out. modAx2 for power up to standing at sink and ultimately unable to achieve upright due to R  knee buckling, with second  attempt pt with min Ax2 for power up and min A for posterior force on R thigh to keep R knee extended for static standing, pt with complaints of dizziness and return to chair, stood one additional time for orthostatic BP and pt able to come to standing in RW with min Ax2    Ambulation/Gait               General Gait Details: deferred due to weakness and knee buckling         Balance Overall balance assessment: Needs assistance Sitting-balance support: No upper extremity supported, Feet supported Sitting balance-Leahy Scale: Fair     Standing balance support: Bilateral upper extremity supported Standing balance-Leahy Scale: Poor Standing balance comment: requires outside support to maintain standing                            Cognition Arousal/Alertness: Awake/alert Behavior During Therapy: Flat affect Overall Cognitive Status: Impaired/Different from baseline Area of Impairment: Awareness, Problem solving, Safety/judgement                       Following Commands: Follows multi-step commands with increased time   Awareness: Emergent Problem Solving: Requires verbal cues, Requires tactile cues, Decreased initiation General Comments: command follow and processing are better today, able to participate in some problem solving for discharge home           General Comments General comments (skin integrity, edema, etc.): Orthostatic vitals taken (please see OT note)      Pertinent Vitals/Pain Pain Assessment Pain Assessment: Faces Faces Pain Scale: Hurts little more Pain Location: LLE > RLE with mobility Pain Descriptors / Indicators: Grimacing, Guarding, Numbness Pain Intervention(s): Monitored during session, Limited activity within patient's tolerance, Repositioned     PT Goals (current goals can now be found in the care plan section) Acute Rehab PT Goals PT Goal Formulation: With patient Time For Goal Achievement:  07/01/21 Potential to Achieve Goals: Fair Progress towards PT goals: Progressing toward goals    Frequency    Min 3X/week (more if possible to progress him)      PT Plan Current plan remains appropriate       AM-PAC PT "6 Clicks" Mobility   Outcome Measure  Help needed turning from your back to your side while in a flat bed without using bedrails?: A Little Help needed moving from lying on your back to sitting on the side of a flat bed without using bedrails?: A Little Help needed moving to and from a bed to a chair (including a wheelchair)?: A Lot Help needed standing up from a chair using your arms (e.g., wheelchair or bedside chair)?: Total Help needed to walk in hospital room?: Total Help needed climbing 3-5 steps with a railing? : Total 6 Click Score: 11    End of Session Equipment Utilized During Treatment: Gait belt Activity Tolerance: Patient tolerated treatment well;Patient limited by pain Patient left: with call bell/phone within reach;in chair;with chair alarm set (for bowel movement as he needs +2 and Stedy for [facility]) Nurse Communication: Mobility status PT Visit Diagnosis: Muscle weakness (generalized) (M62.81);Difficulty in walking, not elsewhere classified (R26.2);Unsteadiness on feet (R26.81)     Time: 0903-1000 PT Time Calculation (min) (ACUTE ONLY): 57 min  Charges:  $Therapeutic Activity: 23-37 mins                     Marieke Lubke B. [employer] PT,  DPT Acute Rehabilitation Services Pager [phone] Office [phone]    [place] 06/29/2021, 12:05 PM

## 2021-06-29 NOTE — Progress Notes (Addendum)
Inpatient Rehab Admissions Coordinator:   At the request of administration, I have reviewed this Pt. For CIR candidacy along with rehab MD, Dr. Leeroy Cha. Pt. Does not have any caregiver support and does not appear likely reach mod I goals prior to d/c from CIR.  Acute therapies anticipate he could reach mod I with wheelchair to bed transfers; however, he will still need assistance bathing and dressing, toileting, preparing meals and medications, getting to appointments, etc. Which  his employer/friend, [name] is not able to assist him with. Mr. Jerilee Hoh  is only able to provide a place for patient to live.  Additionally, Pt. Is likely to have significant wound care needs, which is difficult to arrange for an uninsured patient and poses another barrier to discharge. CIR is not able to accept this Pt. Unless significant gains are made or additional caregiver support is identified. CIR to sign off.   Clemens Catholic, [place], [place] Admissions Coordinator  [phone] ([place]) [phone] (office)

## 2021-06-29 NOTE — Progress Notes (Signed)
Nutrition Follow-up  DOCUMENTATION CODES:   Severe malnutrition in context of acute illness/injury  INTERVENTION:  -Boost Breeze po TID, each supplement provides 250 kcal and 9 grams of protein -Continue 30 ml Prosource Plus TID, each supplement provides 100 kcals and 15 grams protein -Continue Renal MVI daily  If po intake does not improve, may need to consider re-initiation of enteral nutrition to help meet kcal/protein needs.   NUTRITION DIAGNOSIS:   Severe Malnutrition related to acute illness (cardiac arrest, AKI, ileus) as evidenced by energy intake < or equal to 50% for > or equal to 5 days, percent weight loss (7.4% weight loss in less than 2 weeks).  ongoing  GOAL:   Patient will meet greater than or equal to 90% of their needs  progressing  MONITOR:   PO intake, Supplement acceptance, Diet advancement, Labs, Weight trends  REASON FOR ASSESSMENT:   Ventilator, Consult Enteral/tube feeding initiation and management  ASSESSMENT:   46 yo male admitted post OOH cardiac arrest, acute respiratory failure post arrest, possible aspiration-intubated, AKI requiring CRRT. No PMH on file. Pt is spanish speaking  01/15 - admitted, intubated, CRRT initiated 01/16 - TTM initiated 01/19 - extubated, CRRT held 01/20 - diet advanced to Regular 01/21 - NPO due to concern for bowel ischemia 01/22 - full liquid diet, first HD treatment 01/23 - NPO 01/24 - s/p diagnostic laparoscopy revealing viable but thickened bowel without obstruction, second HD treatment 01/25 - clear liquid diet 1/26- advanced to soft diet, third HD treatment  Per MD, pt's ischemic enteritis vs colitis w/ ileus is now resolved.   Pt continues to have decreased appetite but states it is now mostly due to disliking food options. MD liberalized diet to regular in hopes of increasing pt's po intake as last 8 meals have been charted as 0-75% completion (17% avg intake). Pt is doing well with oral nutrition  supplements per RN -- appears Ensure was transition to [employer] per NP. Recommend continue current nutrition plan of care and continue to encourage po intake.   UOP: 4337ml x24 hours I/O: -31.7L since admit  Current weight: 67.6 kg Admit weight: 93.5 kg   Medications: 47ml Prosource Plus TID, colace, Boost Breeze TID, folvite, rena-vit, protonix, miralax, deltasone, sodium bicarbonate, LR @ 114ml/hr Labs: Na 134 (L)  Diet Order:   Diet Order             Diet regular Room service appropriate? Yes; Fluid consistency: Thin  Diet effective now                   EDUCATION NEEDS:   Not appropriate for education at this time  Skin:  Skin Assessment: Skin Integrity Issues: Skin Integrity Issues:: Incisions, Other (Comment) Incisions: abdomen Other: rt knee puncture, sternal burn due to defibrilation pads  Last BM:  2/19  Height:   Ht Readings from Last 1 Encounters:  06/02/21 6' (1.829 m)    Weight:   Wt Readings from Last 1 Encounters:  06/28/21 67.6 kg    BMI:  Body mass index is 20.21 kg/m.  Estimated Nutritional Needs:   Kcal:  2200-2400  Protein:  120-135 grams  Fluid:  1000 ml + UOP     Andres Braun A., MS, RD, LDN (she/her/hers) RD pager number and weekend/on-call pager number located in [place].

## 2021-06-29 NOTE — Consult Note (Addendum)
WOC Nurse wound follow up Patient receiving care in [facility] 5M04 Wound type: superficial right knee wound S/P Intraosseous needle insertion  Wound bed: 85% black eschar and 15% pink tissue surrounding the wound. Santyl alone is not removing the eschar. Would recommend hydrotherapy M-Sa then possibly taper back to MWF as the wound improves.  Dressing procedure/placement/frequency: Hydrotherapy ordered for M-Sa followed by Santyl. Will follow with PT for progression of the wound.  Clean the right knee wound with NS, then apply a nickel thick layer of Santyl over the wound, Cover with a moistened saline gauze, dry gauze and secure with foam dressing. Clean and reapply the Santyl every shift. PT will change with Hydrotherapy M-Sa AM shift. BEDSIDE NURSE WILL NEED TO CHANGE ALL OTHER TIMES  WOC will follow weekly and communicate with PT.  Renaldo Reel Katrinka Blazing, MSN, RN, CMSRN, Angus Seller, [facility] Wound Treatment Associate Pager [phone]

## 2021-06-30 MED ORDER — METHOCARBAMOL 750 MG PO TABS
750.0000 mg | ORAL_TABLET | Freq: Three times a day (TID) | ORAL | Status: DC
  Administered 2021-06-30 – 2021-07-07 (×21): 750 mg via ORAL
  Filled 2021-06-30: qty 1
  Filled 2021-06-30 (×2): qty 2
  Filled 2021-06-30: qty 1
  Filled 2021-06-30: qty 2
  Filled 2021-06-30 (×3): qty 1
  Filled 2021-06-30: qty 2
  Filled 2021-06-30: qty 1.5
  Filled 2021-06-30: qty 2
  Filled 2021-06-30: qty 1
  Filled 2021-06-30: qty 2
  Filled 2021-06-30 (×3): qty 1
  Filled 2021-06-30 (×2): qty 2
  Filled 2021-06-30: qty 1
  Filled 2021-06-30 (×2): qty 2
  Filled 2021-06-30 (×4): qty 1

## 2021-06-30 NOTE — Progress Notes (Signed)
Progress Note   Patient: Andres Braun KKX:[id number] DOB: 10-Jul-1975 DOA: 05/24/2021     37 DOS: the patient was seen and examined on 06/30/2021   Brief hospital course: 46 year old male admitted to ICU 1/15 under PCCM care after cardiac arrest at home.  PEA arrest, s/p CPR x20 minutes and epi x5.  Patient reportedly out drinking with his friends the night prior to admission.  Found by friends with agonal breathing.  Intubated on arrival, needed vasopressors, noted to have renal failure, rhabdomyolysis with CK in the 14 K range, treated for aspiration pneumonia, nephrology consulted and started on CRRT.  Extubated 1/19 and off pressors.  Mental status slowly improved.  Transferred to telemetry.  Care transferred to [facility] on 1/24.  Nephrology following for HD needs.  General surgery on board for questionable small bowel ischemia and taken emergently to surgery 1/24. The bowel in question was found to be thickened, but viable. Hospital course complicated by unexplained left lower extremity weakness. Extensive work up of the neuraxis is negative for compressive pathology. Differential is probably a psoas hematoma causing lumbar plexus compression/ irritation leading to secondary weakness. IR suggested that it is not amenable for aspiration or drain placement. Neurology consulted, suggested not a neurological injury as the neuraxis is negative.    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 bid for muscle  stiffness/hypertonicity Continue Lyrica for neuropathic as well as overall pain Continue Oxy IR to 7.5 mg scheduled every 4 hours Continue STAR accelerated therapy program on Monday 2/20 2/21 CK has normalized, ESR down to 97-suspect exacerbation of rhabdo precipitated by last set of dosing of daptomycin which subsequently was discontinued in favor of oral antibiotics by ID MRI B thighs c/w non specific myositis of hips and bilateral lower extremity- serological work up negative-continue prednisone 50 mg daily x5 days--patient noted with significant improvement in mobility/movement of LEs after initiation of prednisone.  He also states pain has improved D-dimer elevated in the 3.5 range and this is likely from acute rhabdomyolosis.  AST slightly elevated.  Lower extremity venous duplex preliminary negative for DVT   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HCC) Bcx negative Cxr possible atelectasis See additional documentation under PVD and recent rhabdomyolysis ID has changed anbxs to PO doxy/augmentin. LD anbxs 3/3/2-23   Peripheral vascular disease of LEFT lower extremity w/ foot drop MRI of the neuraxis  was unremarkable for acute process . As per the MRI of the lumbar spine, multiple fluid collections in the right ileo psoas muscle.  Dr Benny Lennert spoke with neurology and neuro surgery on 1/24, no surgical cause for his lower extremity weakness. He suggested that a psoas abscess or hematoma could cause lumbar plexus compression /irritation leading to secondary weakness.   IR consulted for aspiration, suggested any aspiration would predispose to infection/ not amenable for aspiration or drain placement. Did not advise aspiration.  MRI of the femur c/w rhabdomyolysis, with the differential including myositis related to connective tissue disease and polymyositis. No drainable abscess identified. pt unable to move the left lower extremity from severe pain and weakness.  Orthopedics consulted, suggested its a neurological/  lumbar plexus ischemic injury.  Hoping for recovery with therapy evaluations.  Vitamin b12 and folate levels wnl. 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 The left toe-brachial index is abnormal.  Vascular evaluated on 2/15 and no acute vascular needs related to abnormal ABIs      Ischemic enteritis versus colitis with ileus-resolved as of 06/29/2021 General surgery consulted, diagnostic laparoscopy unremarkable  C. difficile PCR and GI panel negative    AKI (acute kidney injury)/dehydration secondary to persistent poor oral intake- (present on admission) with metabolic acidosis/rhabdomyolysis Acquired CRRT 1/15-1/19/2023. Nephrology had been following but renal function has recovered Current creatinine 1.64 with BUN 40 and GFR 52 Currently in a negative balance of 28 L Patient continues to have thrombocytosis which may be related to volume depletion.  Current platelet count 898,000 Continue IV fluids since appears to be volume depleted.  Patient reports he is drinking plenty of fluids. Through translator was able to determine that patient does not like the foods that have been sent to his room.  Soft diet changed to regular and translator obtained Spanish language diet and assisted patient in making choices regarding his food versus eating what was picked out for him.   Aspiration pneumonia of both lower lobes due to vomit ([place])- (present on admission) Resolved Stable on room air  R anterior knee wound after placement IO access  Abn ABIs/arterial flow contributing to slow healing Continue local wound care Superficial wound/eschar continues to degrade and expect will slough off per recommendations of vascular services and infectious disease team  Acute posthemorrhagic anemia possiblly from ?psoas hematoma Stool fob + on 1/21 , suspected from nonischemic colitis Has been  given a total of 2 units PRBCs this admission Current hemoglobin 9.3  Severe protein-calorie malnutrition (HCC) Nutrition Status: Nutrition Problem: Severe Malnutrition Etiology: acute illness (cardiac arrest, AKI, ileus) Signs/Symptoms: energy intake < or equal to 50% for > or equal to 5 days, percent weight loss (7.4% weight loss in less than 2 weeks) Percent weight loss: 7.4 % Interventions: Ensure Enlive (each supplement provides 350kcal and 20 grams of protein), Hormel Shake, MVI BMI 20.72 Patient having difficulty finding palatable protein sources.  Prefers bold eggs.  Also states he likes cheese and beans.  Nutritional team working very closely with patient.  He does not like nutritional shakes Dietitian will discuss with patient whether he wants short-term use of core track to increase protein intake  Septic shock due to undetermined organism (HCC)-resolved as of 06/29/2021, (present on admission) Sepsis physiology resolved  Cardiac arrest with pulseless electrical activity ([place]) S/p PEA arrest, s/p CPR x 20 minutes and Epi x 5. Echocardiogram 1/15 with preserved LV function Not on a statin  Elevated liver enzymes Mild transaminitis has context of reemergence of mild rhabdomyollosis Of note CT this admission shows no evidence of cirrhosis  Situational depression Patient experiencing depression prior to admission due to dissolution of his marriage Further worsening of depression symptoms based on current physical debility and lack of clarity on recovery Zoloft initiated on 2/15  Thrombocytosis Reactive or from infection  Hyponatremia Improving.  Likely 2/2 aki  Acute respiratory failure with hypoxia and hypercapnia ([place]) Patient intubated on 05/24/2021 and extubated on 05/28/2021 .  patient completed course of antibiotics for aspiration pneumonia  2/3 currently on room air        Subjective:  Patient reporting less pain but continues to have numbness in both  extremities.  Does not like  current liquid protein supplementation and has been working with nutrition services/dietitian to improve protein intake.  Physical Exam: Vitals:   06/30/21 0010 06/30/21 0408 06/30/21 0441 06/30/21 0908  BP: (!) 145/90 (!) 138/97  (!) 141/89  Pulse: (!) 118 (!) 116  (!) 112  Resp: 17 15  18   Temp: 98.5 F (36.9 C) 98.7 F (37.1 C)  98 F (36.7 C)  TempSrc:  Oral  Oral  SpO2: 99% 100%  100%  Weight:   72.8 kg   Height:       General:  Pleasantly resting in bed, NAD Lungs:  Clear to auscultate bilaterally -room air-normal work of breathing Heart:  Regular rate and rhythm.  Without murmurs, rubs, or gallops. Abdomen:  Soft, nontender, nondistended.  Hypoactive bowel sounds LBM 2/19 Vascular: Weak DP/pedal pulses bilaterally. Neurological: Cranial nerves II through XII grossly intact, moves all extremities x4.  Upper extremity strength 4-5/5.  As of 2/21 patient is now able to move both legs although able to move right leg significantly better.  Movement primarily restricted by pain in the hip and thigh area.  Continues to report numbness both lower extremities that he states was not present prior to admission Skin: Right lower extremity anterior wound from previous IO placement  Data Reviewed:  All results have been reviewed and noted within the progress note documentation  Family Communication:  Patient only utilizing Spanish language translator  Disposition: Remains inpatient appropriate because:  Unsafe discharge plan Barriers to discharge: Patient will need to discharge to the home environment and based on current PT evaluation is not quite ready to discharge and maintain independence in a home setting.  He will not have 24/7 assistance once discharged.  Currently working on wheelchair transfers and other home-based activities.  His training has been initiated on 2/16   Planned Discharge Destination:  Barriers to discharge: Patient  non-English-speaking and does not have payer source for rehabilitative therapies such as SNF and currently is not able to independently mobilize   A physical therapy consult is indicated based on the patients mobility assessment.   Mobility Assessment (last 72 hours)     Mobility Assessment     Row Name 06/30/21 1000 06/29/21 1957 06/29/21 1100   Does patient have an order for bedrest or is patient medically unstable -- No - Continue assessment --   What is the highest level of mobility based on the progressive mobility assessment? Level 3 (Stands with assist) - Balance while standing  and cannot march in place Level 3 (Stands with assist) - Balance while standing  and cannot march in place Level 3 (Stands with assist) - Balance while standing  and cannot march in place   Is the above level different from baseline mobility prior to current illness? -- Yes - Recommend PT order --    [place] Name 06/28/21 1941 06/28/21 0800     Does patient have an order for bedrest or is patient medically unstable No - Continue assessment No - Continue assessment    What is the highest level of mobility based on the progressive mobility assessment? Level 2 ([place]) - Balance while sitting on edge of bed and cannot stand Level 1 (Bedfast) - Unable to balance while sitting on edge of bed    Is the above level different from baseline mobility prior to current illness? Yes - Recommend PT order Yes - Recommend PT order            Medically stable Yes  COVID  vaccination status:  Unknown  Consultants: Nephrology Neurology General surgery Orthopedic Infectious disease Procedures: Echocardiogram EEG Core track Temporary dialysis catheter Laparoscopic evaluation of abdomen in context of evaluation for ischemic bowel Antibiotics: Ceftriaxone 1/15 through 1/19 Ceftriaxone 1/21 through 1/23 Flagyl 1/21 through 1/23 Zosyn 1/24 through 1/26 Ancef 1/27 through 1/31 Vancomycin x1 dose on  2/1 Daptomycin 2/3 through 2/15 Cefepime 2/4 through 2/15 Doxycycline and Augmentin 2/15 with last doses due 3/3      Time spent: 25 minutes  Author: Erin Hearing, NP 06/30/2021 12:39 PM  For on call review [email or web address].

## 2021-06-30 NOTE — Progress Notes (Addendum)
Physical Therapy Treatment Patient Details Name: Andres Braun MRN: [id number] DOB: [birth year] Today's Date: 06/30/2021   History of Present Illness Pt is a 46 y.o. male admitted 05/24/21 after cardiac arrest at home; pt with agonal breathing, lost pulse, required 20-min CPR and 5 rounds epi. Workup for acute hypoxic respiratory failure, septic shock due to undertermined organism, AKI, acute metabolic encephalopathy, aspiration PNA of bilateral lower lobes due to vomit. ETT 1/15-1/19. CRRT 1/15-1/20. Brain MRI 1/22 negative. Cervical MRI 1/22 with small R-side disc protrusion C5-6. Abdominal CT showed muscular swelling, particularly iliopsoas and gluteal muscles consistent with rhabdomyolysis; hemorrhage noted within R iliopsoas. S/p diagnostic laparoscopy 1/24. MRI L femur 1/25 showed diffuse muscular abnormalities bilateral thighs with heterogenous T2 signal, especially within L glut max and gradratus femoris; findings compatible with dx of rhabdomyolysis, no drainable abscess identified; these findings could contribute to compartment syndrome. 2/17 MRI B thighs c/w non specific myositis of hips and bilateral lower extremity- serological work up negative-begin prednisone 50 mg daily x5 days PMH Unknown    PT Comments    [facility] PT/OT session: In house Spanish interpreter Andres Braun utilized throughout session. Focus of session education on role of protein intake in tissue repair in terms of both his muscles and his wound. Pt with better understanding. NP changed pt to full diet and more ethnically appropriate lunch and dinner ordered. Pt pain improved today as is standing and exercise tolerance. Pt continues to requires modAx2 for power up but is able to maintain standing for approximately 2.5 min and is able to work on [place]. Pt able to perform AAROM of LE with improved muscle activation. Before return to supine pt pivot bed<>BSC. D/c plan remains appropriate, however will continue to work towards  discharge to [place]' home.      Recommendations for follow up therapy are one component of a multi-disciplinary discharge planning process, led by the attending physician.  Recommendations may be updated based on patient status, additional functional criteria and insurance authorization.  Follow Up Recommendations  Skilled nursing-short term rehab (<3 hours/day)     Assistance Recommended at Discharge Frequent or constant Supervision/Assistance  Patient can return home with the following A lot of help with walking and/or transfers;Assistance with cooking/housework;Direct supervision/assist for medications management;Direct supervision/assist for financial management;Assist for transportation;Help with stairs or ramp for entrance;A lot of help with bathing/dressing/bathroom   Equipment Recommendations  Wheelchair (measurements PT);Wheelchair cushion (measurements PT);BSC/3in1;Hospital bed (drop arm BSC,)    Recommendations for Other Services      Precautions / Restrictions Precautions Precautions: Fall;Other (comment) Restrictions Weight Bearing Restrictions: No     Mobility  Bed Mobility Overal bed mobility: Needs Assistance Bed Mobility: Supine to Sit, Sit to Supine     Supine to sit: Min guard Sit to supine: Min assist   General bed mobility comments: min guard and heavy use of bed rails to come to seated EoB, min A for management of L LE back into bed.    Transfers Overall transfer level: Needs assistance   Transfers: Bed to chair/wheelchair/BSC Sit to Stand: Mod assist, +2 physical assistance, Min assist     Squat pivot transfers: Min assist, +2 physical assistance     General transfer comment: modAx2 for power up into standing, able to stand approximately 2.5 min while BP taken, pt limited in standing tolerance by pain rather than orthostatics. after seated rest break returned to standing and worked on standing tolerance and pre-gait weightshifting in standing, pt  able to squat pivot to  BSC and back with min Ax2 for safety    Ambulation/Gait             Pre-gait activities: weight shifting General Gait Details: working towards         Balance Overall balance assessment: Needs assistance Sitting-balance support: No upper extremity supported, Feet supported Sitting balance-Leahy Scale: Fair     Standing balance support: Bilateral upper extremity supported Standing balance-Leahy Scale: Poor Standing balance comment: requires outside support to maintain standing                            Cognition Arousal/Alertness: Awake/alert Behavior During Therapy: Flat affect Overall Cognitive Status: Impaired/Different from baseline Area of Impairment: Awareness, Problem solving, Safety/judgement                       Following Commands: Follows multi-step commands with increased time   Awareness: Emergent Problem Solving: Requires verbal cues, Requires tactile cues, Decreased initiation General Comments: some of pt's cognitive limitations are related to lack of formal education rather than understanding, provided more education on nutritional needs for muscle repair        Exercises General Exercises - Lower Extremity Long Arc Quad: AAROM, Both, 10 reps, Seated Hip Flexion/Marching: AAROM, Both, 10 reps    General Comments General comments (skin integrity, edema, etc.): at rest in sitting pt HR 114 bpm, BP 116/92 with standing HR max noted 158bpm, BP 106/85, with return to seated HR 145 BP 112/86, increased HR likely due to increased pain and breath holding will continue to work on deep breathing      Pertinent Vitals/Pain Pain Assessment Pain Assessment: Faces Faces Pain Scale: Hurts little more Pain Location: LLE > RLE with mobility Pain Descriptors / Indicators: Grimacing, Guarding, Numbness Pain Intervention(s): Limited activity within patient's tolerance, Monitored during session, Repositioned     PT  Goals (current goals can now be found in the care plan section) Acute Rehab PT Goals PT Goal Formulation: With patient Time For Goal Achievement: 07/01/21 Potential to Achieve Goals: Fair Progress towards PT goals: Progressing toward goals    Frequency    Min 3X/week (more if possible to progress him)      PT Plan Current plan remains appropriate    Co-evaluation PT/OT/SLP Co-Evaluation/Treatment: Yes Reason for Co-Treatment: For patient/therapist safety PT goals addressed during session: Mobility/safety with mobility OT goals addressed during session: ADL's and self-care      AM-PAC PT "6 Clicks" Mobility   Outcome Measure  Help needed turning from your back to your side while in a flat bed without using bedrails?: A Little Help needed moving from lying on your back to sitting on the side of a flat bed without using bedrails?: A Little Help needed moving to and from a bed to a chair (including a wheelchair)?: A Lot Help needed standing up from a chair using your arms (e.g., wheelchair or bedside chair)?: Total Help needed to walk in hospital room?: Total Help needed climbing 3-5 steps with a railing? : Total 6 Click Score: 11    End of Session Equipment Utilized During Treatment: Gait belt Activity Tolerance: Patient tolerated treatment well;Patient limited by pain Patient left: with call bell/phone within reach;in chair;with chair alarm set (for bowel movement as he needs +2 and [place] for [facility]) Nurse Communication: Mobility status PT Visit Diagnosis: Muscle weakness (generalized) (M62.81);Difficulty in walking, not elsewhere classified (R26.2);Unsteadiness on feet (R26.81)  Time: [id number] PT Time Calculation (min) (ACUTE ONLY): 62 min  Charges:  $Therapeutic Activity: 8-22 mins $Self Care/Home Management: 8-22                     Steffan Caniglia B. Migdalia Dk PT, DPT Acute Rehabilitation Services Pager [phone] Office [phone]    [place] 06/30/2021, 10:26 AM

## 2021-06-30 NOTE — Progress Notes (Signed)
Occupational Therapy Treatment Patient Details Name: Andres Braun MRN: [id number] DOB: [birth year] Today's Date: 06/30/2021   History of present illness Pt is a 46 y.o. male admitted 05/24/21 after cardiac arrest at home; pt with agonal breathing, lost pulse, required 20-min CPR and 5 rounds epi. Workup for acute hypoxic respiratory failure, septic shock due to undertermined organism, AKI, acute metabolic encephalopathy, aspiration PNA of bilateral lower lobes due to vomit. ETT 1/15-1/19. CRRT 1/15-1/20. Brain MRI 1/22 negative. Cervical MRI 1/22 with small R-side disc protrusion C5-6. Abdominal CT showed muscular swelling, particularly iliopsoas and gluteal muscles consistent with rhabdomyolysis; hemorrhage noted within R iliopsoas. S/p diagnostic laparoscopy 1/24. MRI L femur 1/25 showed diffuse muscular abnormalities bilateral thighs with heterogenous T2 signal, especially within L glut max and gradratus femoris; findings compatible with dx of rhabdomyolysis, no drainable abscess identified; these findings could contribute to compartment syndrome. 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  Patient educated on the need of increasing protein intake to increase tissue repair and muscle growth. Patient performed standing from EOB with education on Weight shifting and use of BUE to aide in standing. Patient performed grooming seated on EOB and toilet transfer with squat pivot transfer to [facility] and back to EOB.  Patient able to perform toilet hygiene seated with supervision.  Patient making good gains and will continue with STAR program.    Recommendations for follow up therapy are one component of a multi-disciplinary discharge planning process, led by the attending physician.   Recommendations may be updated based on patient status, additional functional criteria and insurance authorization.    Follow Up Recommendations  Skilled nursing-short term rehab (<3 hours/day)    Assistance Recommended at Discharge Frequent or constant Supervision/Assistance  Patient can return home with the following  A lot of help with walking and/or transfers;A lot of help with bathing/dressing/bathroom;Assistance with cooking/housework;Assist for transportation;Direct supervision/assist for financial management;Direct supervision/assist for medications management;Help with stairs or ramp for entrance   Equipment Recommendations  BSC/3in1;Wheelchair (measurements OT);Wheelchair cushion (measurements OT)    Recommendations for Other Services      Precautions / Restrictions Precautions Precautions: Fall;Other (comment) Restrictions Weight Bearing Restrictions: No       Mobility Bed Mobility                    Transfers                         Balance                                           ADL either performed or assessed with clinical judgement   ADL Overall ADL's : Needs assistance/impaired     Grooming: Wash/dry hands;Wash/dry face;Oral care;Supervision/safety;Set up;Sitting Grooming Details (indicate cue type and reason): performed seated on EOB             Lower Body Dressing: Moderate assistance Lower Body Dressing Details (indicate cue type and reason): required assistance to thread socks onto foot and patient was able to complete with assistance reaching sock Toilet Transfer: Minimal assistance;Squat-pivot;BSC/3in1 Toilet Transfer Details (indicate cue type and reason): min assist for squat pivot transfer  to drop arm BSC and back to EOB Toileting- Clothing Manipulation and Hygiene: Supervision/safety;Sitting/lateral lean Toileting - Clothing Manipulation Details (indicate cue type and reason): able to perform toilet  hygiene following BM while seated on BSC       General ADL Comments: patient was reluntant to address LB dressing but performed with encouragement.    Extremity/Trunk Assessment Upper Extremity Assessment RUE Coordination: decreased fine motor;decreased gross motor            Vision       Perception     Praxis      Cognition                                                Exercises      Shoulder Instructions       General Comments at rest in sitting pt HR 114 bpm, BP 116/92 with standing HR max noted 158bpm, BP 106/85, with return to seated HR 145 BP 112/86, increased HR likely due to increased pain and breath holding will continue to work on deep breathing    Pertinent Vitals/ Pain          Home Living                                          Prior Functioning/Environment              Frequency  Min 5X/week        Progress Toward Goals  OT Goals(current goals can now be found in the care plan section)  Progress towards OT goals: Progressing toward goals  Acute Rehab OT Goals Patient Stated Goal: go home OT Goal Formulation: With patient Time For Goal Achievement: 07/10/21 Potential to Achieve Goals: Good ADL Goals Pt Will Perform Grooming: with set-up;with supervision;sitting Pt Will Perform Upper Body Bathing: with set-up;with supervision;sitting Pt Will Perform Lower Body Bathing: with min assist;with adaptive equipment;sitting/lateral leans Pt Will Transfer to Toilet: with min assist;squat pivot transfer;bedside commode Pt Will Perform Toileting - Clothing Manipulation and hygiene: with min assist;sitting/lateral leans Additional ADL Goal #1: Pt will be Min A in and OOB for basic ADLs Additional ADL Goal #2: pt will complete bed level bed pain mod I with flat bed surface Additional ADL Goal #3: pt will complete w/c transfer mod I  Plan Discharge plan remains appropriate    Co-evaluation    PT/OT/SLP  Co-Evaluation/Treatment: Yes Reason for Co-Treatment: For patient/therapist safety PT goals addressed during session: Mobility/safety with mobility OT goals addressed during session: ADL's and self-care      AM-PAC OT "6 Clicks" Daily Activity     Outcome Measure   Help from another person eating meals?: None Help from another person taking care of personal grooming?: A Little Help from another person toileting, which includes using toliet, bedpan, or urinal?: A Little Help from another person bathing (including washing, rinsing, drying)?: A Lot Help from another person to put on and taking off regular upper body clothing?: A Little Help from another person to put on and taking off regular lower body clothing?: A Lot 6 Click Score: 17    End of Session Equipment Utilized During Treatment: Gait belt;Rolling walker (2 wheels)  OT Visit Diagnosis: Unsteadiness on feet (R26.81);Other abnormalities of  gait and mobility (R26.89);Muscle weakness (generalized) (M62.81);Other symptoms and signs involving cognitive function;Pain Pain - Right/Left: Left Pain - part of body: Hip   Activity Tolerance Patient limited by pain   Patient Left in bed;with call bell/phone within reach (wound care PT followed treatment)   Nurse Communication Mobility status        Time: [id number] OT Time Calculation (min): 62 min  Charges: OT General Charges $OT Visit: 1 Visit OT Treatments $Self Care/Home Management : 8-22 mins  Lodema Hong, [place]  Pager [phone] Office [place] 06/30/2021, 11:56 AM

## 2021-06-30 NOTE — Progress Notes (Signed)
Physical Therapy Wound Evaluation and Treatment Patient Details  Name: Andres Braun MRN: [id number] Date of Birth: 1975/10/25  Today's Date: 06/30/2021 Time: 1010-1045 Time Calculation (min): 35 min  Subjective  Subjective Assessment Subjective: Graciela, in person interpreter, present throughout session. Pt pleasant and agreeable to hydrotherapy. Patient and Family Stated Goals: None stated Date of Onset:  (Unknown) Prior Treatments: Dressing changes with Santyl  Pain Score:  Pt premedicated for PT/OT session immediately before. Pt did not complain of pain throughout session.   Wound Assessment  Wound / Incision (Open or Dehisced) 05/28/21 Puncture Knee Anterior;Right (Active)  Wound Image   06/30/21 1441  Dressing Type Santyl;Foam - Lift dressing to assess site every shift;Gauze (Comment);Barrier Film (skin prep);Moist to moist 06/30/21 1441  Dressing Changed Changed 06/30/21 1441  Dressing Status Clean, Dry, Intact 06/30/21 1441  Dressing Change Frequency Daily 06/30/21 1441  Site / Wound Assessment Pink;Yellow;Black 06/30/21 1441  % Wound base Red or Granulating 15% 06/30/21 1441  % Wound base Yellow/Fibrinous Exudate 10% 06/30/21 1441  % Wound base Black/Eschar 75% 06/30/21 1441  % Wound base Other/Granulation Tissue (Comment) 0% 06/30/21 1441  Peri-wound Assessment Intact 06/30/21 1441  Wound Length (cm) 11.5 cm 06/30/21 1000  Wound Width (cm) 12.5 cm 06/30/21 1000  Wound Depth (cm) 0.1 cm 06/30/21 1000  Wound Volume (cm^3) 14.38 cm^3 06/30/21 1000  Wound Surface Area (cm^2) 143.75 cm^2 06/30/21 1000  Tunneling (cm) 0 06/30/21 1441  Undermining (cm) 0 06/30/21 1441  Margins Unattached edges (unapproximated) 06/30/21 1441  Closure None 06/30/21 1441  Drainage Amount Scant 06/30/21 1441  Drainage Description Serosanguineous 06/30/21 1441  Non-staged Wound Description Partial thickness 06/27/21 0800  Treatment Debridement (Selective);Hydrotherapy (Pulse lavage);Packing  (Saline gauze) 06/30/21 1441      Hydrotherapy Pulsed lavage therapy - wound location: R knee Pulsed Lavage with Suction (psi): 8 psi Pulsed Lavage with Suction - Normal Saline Used: 1000 mL Pulsed Lavage Tip: Tip with splash shield Selective Debridement Selective Debridement - Location: R knee Selective Debridement - Tools Used: Forceps, Scalpel Selective Debridement - Tissue Removed: Black eschar    Wound Assessment and Plan  Wound Therapy - Assess/Plan/Recommendations Wound Therapy - Clinical Statement: Pt presents to hydrotherapy with a R knee wound covered by eschar. Pt reported minimal pain throughout session. [name], spanish interpreter present throughout session. This patient will benefit from continued hydrotherapy for selective removal of unviable tissue, to decrease bioburden, and promote wound bed healing. Wound Therapy - Functional Problem List: Immobility Factors Delaying/Impairing Wound Healing: Multiple medical problems, Other (comment) (Poor nutrition) Hydrotherapy Plan: Debridement, Dressing change, Patient/family education, Pulsatile lavage with suction Wound Therapy - Frequency: 6X / week  Wound Therapy Goals- Improve the function of patient's integumentary system by progressing the wound(s) through the phases of wound healing (inflammation - proliferation - remodeling) by: Wound Therapy Goals - Improve the function of patient's integumentary system by progressing the wound(s) through the phases of wound healing by: Decrease Necrotic Tissue to: 20% Decrease Necrotic Tissue - Progress: Goal set today Increase Granulation Tissue to: 80% Increase Granulation Tissue - Progress: Goal set today Goals/treatment plan/discharge plan were made with and agreed upon by patient/family: Yes Time For Goal Achievement: 7 days Wound Therapy - Potential for Goals: Good  Goals will be updated until maximal potential achieved or discharge criteria met.  Discharge criteria: when  goals achieved, discharge from hospital, MD decision/surgical intervention, no progress towards goals, refusal/missing three consecutive treatments without notification or medical reason.  GP  Charges PT Wound Care Charges $Wound Debridement up to 20 cm: < or equal to 20 cm $ Wound Debridement each add'l 20 sqcm: 6 $PT PLS Gun and Tip: 1 Supply $PT Hydrotherapy Visit: 1 Visit       [name] 06/30/2021, 2:53 PM  Rolinda Roan, PT, DPT Acute Rehabilitation Services Pager: [phone] Office: [phone]

## 2021-07-01 LAB — CK: Total CK: 106 U/L (ref 49–397)

## 2021-07-01 NOTE — Progress Notes (Signed)
Progress Note   Patient: Andres Braun [id number] DOB: 09-Apr-1976 DOA: 05/24/2021     38 DOS: the patient was seen and examined on 07/01/2021   Brief hospital course: 46 year old male admitted to ICU 1/15 under PCCM care after cardiac arrest at home.  PEA arrest, s/p CPR x20 minutes and epi x5.  Patient reportedly out drinking with his friends the night prior to admission.  Found by friends with agonal breathing.  Intubated on arrival, needed vasopressors, noted to have renal failure, rhabdomyolysis with CK in the 14 K range, treated for aspiration pneumonia, nephrology consulted and started on CRRT.  Extubated 1/19 and off pressors.  Mental status slowly improved.  Transferred to telemetry.  Care transferred to [facility] on 1/24.  Nephrology following for HD needs.  General surgery on board for questionable small bowel ischemia and taken emergently to surgery 1/24. The bowel in question was found to be thickened, but viable. Hospital course complicated by unexplained left lower extremity weakness. Extensive work up of the neuraxis is negative for compressive pathology. Differential is probably a psoas hematoma causing lumbar plexus compression/ irritation leading to secondary weakness. IR suggested that it is not amenable for aspiration or drain placement. Neurology consulted, suggested not a neurological injury as the neuraxis is negative.    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  He also states pain has improved D-dimer elevated in the 3.5 range and this is likely from acute rhabdomyolosis.  AST slightly elevated.  Lower extremity venous duplex negative for DVT   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HCC) Bcx negative Cxr possible atelectasis See additional documentation under PVD and recent rhabdomyolysis ID has changed anbxs to PO doxy/augmentin for 16 days with first dose on 2/15. LD anbxs 07/10/21   Peripheral vascular disease of LEFT lower extremity w/ foot drop MRI of the neuraxis  was unremarkable for acute process . As per the MRI of the lumbar spine, multiple fluid collections in the right ileo psoas muscle.  Dr Benny Lennert spoke with neurology and neuro surgery on 1/24, no surgical cause for his lower extremity weakness. He suggested that a psoas abscess or hematoma could cause lumbar plexus compression /irritation leading to secondary weakness.   IR consulted for aspiration, suggested any aspiration would predispose to infection/ not amenable for aspiration or drain placement. Did not advise aspiration.  MRI of the femur c/w rhabdomyolysis, with the differential including myositis related to connective tissue disease and polymyositis. No drainable abscess identified. pt unable to move the left lower extremity from severe pain and weakness.  Orthopedics consulted, suggested its a neurological/ lumbar plexus ischemic injury.  Hoping for recovery with therapy evaluations.  Vitamin b12 and folate levels wnl. 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 The left toe-brachial index is abnormal.  Vascular evaluated on 2/15 and no acute vascular needs related to abnormal ABIs      Ischemic enteritis versus colitis with ileus-resolved as of 06/29/2021 General surgery consulted, diagnostic laparoscopy unremarkable  C. difficile PCR and GI panel negative    AKI (acute kidney injury)/dehydration secondary to persistent poor oral intake- (present on admission) with metabolic acidosis/rhabdomyolysis Acquired CRRT 1/15-1/19/2023. Nephrology had been following but renal function has recovered Redeveloped mild AKI and worsening thrombocytosis-evaluated by hematology AKI has resolved with hydration and correction of mild rhabdomyolosis Poor oral intake influencing hydration status.  IV fluids were at 150 an hour to treat elevated CK but CK has normalized therefore IV fluids decreased to 50 cc/h on 2/20   Aspiration pneumonia of both lower lobes due to vomit ([place])- (present on admission) Resolved Stable on room air  R anterior knee wound after placement IO access   2/14                              2/21     Abn ABIs/arterial flow contributing to slow healing Continue local wound care Superficial wound/eschar continues to degrade and expect will slough off per recommendations of vascular services and infectious disease team  Acute posthemorrhagic anemia possiblly from ?psoas hematoma Stool fob + on 1/21 , suspected from nonischemic colitis Has been given a total of 2 units PRBCs this admission Current hemoglobin 9.3  Severe protein-calorie malnutrition (HCC) Nutrition Status: Nutrition Problem: Severe Malnutrition Etiology:  acute illness (cardiac arrest, AKI, ileus) Signs/Symptoms: energy intake < or equal to 50% for > or equal to 5 days, percent weight loss (7.4% weight loss in less than 2 weeks) Percent weight loss: 7.4 % Interventions: Ensure Enlive (each supplement provides 350kcal and 20 grams of protein), Hormel Shake, MVI BMI 20.72 Patient having difficulty finding palatable protein sources.  Dietitian working with patient regarding choices and available products in the hospital As of 2/22 albumin is 2.5  Septic shock due to undetermined organism (HCC)-resolved as of 06/29/2021, (present on admission) Sepsis physiology resolved  Cardiac arrest with pulseless electrical activity ([place]) S/p PEA arrest, s/p CPR x 20 minutes and Epi x 5. Echocardiogram 1/15 with preserved LV function Not on a statin  Elevated liver enzymes Mild transaminitis has context of reemergence of mild rhabdomyollosis Of note CT this admission shows no evidence of cirrhosis  Situational depression Patient experiencing depression prior to admission due to dissolution of his marriage Further worsening of depression symptoms based on current physical debility and lack of clarity on recovery Zoloft initiated on 2/15  Thrombocytosis Reactive secondary to low iron and acute critical illness anemia Was given IV iron by hematology team who has subsequently signed off  Hyponatremia-resolved as of 07/01/2021 Improving.  Likely 2/2 aki  Acute respiratory failure with hypoxia and hypercapnia (HCC)-resolved as of 07/01/2021 Patient intubated on 05/24/2021 and extubated on 05/28/2021 .  patient completed course of antibiotics for aspiration pneumonia  2/3 currently on room air        Subjective:  Reporting decreased pain and increased ability to move legs today.  He is very excited.  Dietitian at bedside exploring alternative protein options  for the patient.  Physical Exam: Vitals:   06/30/21 2047 07/01/21 0515 07/01/21 0518  07/01/21 0859  BP: (!) 132/95  (!) 124/103 (!) 145/95  Pulse: (!) 103  (!) 111 (!) 114  Resp: 18  20   Temp: 99 F (37.2 C)  98 F (36.7 C)   TempSrc: Oral     SpO2: 99%  99% 100%  Weight:  69.6 kg    Height:       General:  Pleasantly resting in bed, NAD Lungs:  Clear to auscultate bilaterally -room air-normal work of breathing Heart:  Regular rate and rhythm.  Without murmurs, rubs, or gallops. Abdomen:  Soft, nontender, nondistended.  Hypoactive bowel sounds LBM 2/19 Vascular: Weak DP/pedal pulses bilaterally. Neurological: Cranial nerves II through XII grossly intact, moves all extremities x4.  Upper extremity strength 4-5/5.  Strength and movement in legs significantly improved.  Patient now able to flex legs at the hip with little to no pain in the thigh and hip area although still pain primarily with the left leg.  Strength on right 3-4/5 in strength on left 2-3/5.  Movement primarily restricted by pain in the hip and thigh area.  Continues to report numbness both lower extremities that he states was not present prior to admission Skin: Right lower extremity anterior wound from previous IO placement  Data Reviewed:  All results have been reviewed and noted within the progress note documentation  Family Communication:  Patient only utilizing Spanish language translator  Disposition: Remains inpatient appropriate because:  Unsafe discharge plan Barriers to discharge: Patient will need to discharge to the home environment and based on current PT evaluation is not quite ready to discharge and maintain independence in a home setting.  He will not have 24/7 assistance once discharged.  Currently working on wheelchair transfers and other home-based activities.  His training has been initiated on 2/16   Planned Discharge Destination:  Barriers to discharge: Patient non-English-speaking and does not have payer source for rehabilitative therapies such as SNF and currently is not able  to independently mobilize   A physical therapy consult is indicated based on the patients mobility assessment.   Mobility Assessment (last 72 hours)     Mobility Assessment     Row Name 07/01/21 1100 07/01/21 1055 06/30/21 1000   Does patient have an order for bedrest or is patient medically unstable -- No - Continue assessment --   What is the highest level of mobility based on the progressive mobility assessment? Level 3 (Stands with assist) - Balance while standing  and cannot march in place Level 3 (Stands with assist) - Balance while standing  and cannot march in place Level 3 (Stands with assist) - Balance while standing  and cannot march in place   Is the above level different from baseline mobility prior to current illness? -- Yes - Recommend PT order --    [place] Name 06/29/21 1957 06/29/21 1100 06/28/21 1941   Does patient have an order for bedrest or is patient medically unstable No - Continue assessment -- No - Continue assessment   What is the highest level of mobility based on the progressive mobility assessment? Level 3 (Stands with assist) - Balance while standing  and cannot march in place Level 3 (Stands with assist) - Balance while standing  and cannot march in place Level 2 ([place]) - Balance while sitting on edge of bed and cannot stand   Is the above level different from baseline mobility prior  to current illness? Yes - Recommend PT order -- Yes - Recommend PT order           Medically stable Yes  COVID vaccination status:  Unknown  Consultants: Nephrology Neurology General surgery Orthopedic Infectious disease Procedures: Echocardiogram EEG Core track Temporary dialysis catheter Laparoscopic evaluation of abdomen in context of evaluation for ischemic bowel Antibiotics: Ceftriaxone 1/15 through 1/19 Ceftriaxone 1/21 through 1/23 Flagyl 1/21 through 1/23 Zosyn 1/24 through 1/26 Ancef 1/27 through 1/31 Vancomycin x1 dose on 2/1 Daptomycin 2/3  through 2/15 Cefepime 2/4 through 2/15 Doxycycline and Augmentin 2/15 with last doses due 3/3      Time spent: 25 minutes  Author: Erin Hearing, NP 07/01/2021 1:10 PM  For on call review [email or web address].

## 2021-07-01 NOTE — Progress Notes (Signed)
Physical Therapy Wound Treatment Patient Details  Name: Andres Braun MRN: [id number] Date of Birth: [birth year]  Today's Date: 07/01/2021 Time: 2947-6546 Time Calculation (min): 25 min  Subjective  Subjective Assessment Subjective: Andres Braun, in person interpreter, present throughout session. Pt pleasant and agreeable to hydrotherapy. Patient and Family Stated Goals: None stated Date of Onset:  (Unknown) Prior Treatments: Dressing changes with Santyl  Pain Score:  Pt premedicated for therapy sessions prior to hydro. Overall tolerated well without complaints of pain.   Wound Assessment  Wound / Incision (Open or Dehisced) 05/28/21 Puncture Knee Anterior;Right (Active)  Dressing Type Santyl;Foam - Lift dressing to assess site every shift;Gauze (Comment);Barrier Film (skin prep);Moist to moist 07/01/21 1407  Dressing Changed Changed 07/01/21 1407  Dressing Status Clean, Dry, Intact 07/01/21 1407  Dressing Change Frequency Daily 07/01/21 1407  Site / Wound Assessment Pink;Yellow;Black 07/01/21 1407  % Wound base Red or Granulating 15% 07/01/21 1407  % Wound base Yellow/Fibrinous Exudate 45% 07/01/21 1407  % Wound base Black/Eschar 40% 07/01/21 1407  % Wound base Other/Granulation Tissue (Comment) 0% 07/01/21 1407  Peri-wound Assessment Intact 07/01/21 1407  Wound Length (cm) 11.5 cm 06/30/21 1000  Wound Width (cm) 12.5 cm 06/30/21 1000  Wound Depth (cm) 0.1 cm 06/30/21 1000  Wound Volume (cm^3) 14.38 cm^3 06/30/21 1000  Wound Surface Area (cm^2) 143.75 cm^2 06/30/21 1000  Tunneling (cm) 0 06/30/21 1441  Undermining (cm) 0 06/30/21 1441  Margins Unattached edges (unapproximated) 07/01/21 1407  Closure None 07/01/21 1407  Drainage Amount Scant 07/01/21 1407  Drainage Description Serosanguineous 07/01/21 1407  Non-staged Wound Description Partial thickness 06/27/21 0800  Treatment Debridement (Selective);Hydrotherapy (Pulse lavage);Packing (Saline gauze) 07/01/21 1407       Hydrotherapy Pulsed lavage therapy - wound location: R knee Pulsed Lavage with Suction (psi): 8 psi Pulsed Lavage with Suction - Normal Saline Used: 1000 mL Pulsed Lavage Tip: Tip with splash shield Selective Debridement Selective Debridement - Location: R knee Selective Debridement - Tools Used: Forceps, Scalpel Selective Debridement - Tissue Removed: Black eschar    Wound Assessment and Plan  Wound Therapy - Assess/Plan/Recommendations Wound Therapy - Clinical Statement: Progressing with debridement. Eschar appears to be softening. This patient will benefit from continued hydrotherapy for selective removal of unviable tissue, to decrease bioburden, and promote wound bed healing. Wound Therapy - Functional Problem List: Immobility Factors Delaying/Impairing Wound Healing: Multiple medical problems, Other (comment) (Poor nutrition) Hydrotherapy Plan: Debridement, Dressing change, Patient/family education, Pulsatile lavage with suction Wound Therapy - Frequency: 6X / week  Wound Therapy Goals- Improve the function of patient's integumentary system by progressing the wound(s) through the phases of wound healing (inflammation - proliferation - remodeling) by: Wound Therapy Goals - Improve the function of patient's integumentary system by progressing the wound(s) through the phases of wound healing by: Decrease Necrotic Tissue to: 20% Decrease Necrotic Tissue - Progress: Progressing toward goal Increase Granulation Tissue to: 80% Increase Granulation Tissue - Progress: Progressing toward goal Goals/treatment plan/discharge plan were made with and agreed upon by patient/family: Yes Time For Goal Achievement: 7 days Wound Therapy - Potential for Goals: Good  Goals will be updated until maximal potential achieved or discharge criteria met.  Discharge criteria: when goals achieved, discharge from hospital, MD decision/surgical intervention, no progress towards goals, refusal/missing three  consecutive treatments without notification or medical reason.  GP     Charges PT Wound Care Charges $Wound Debridement up to 20 cm: < or equal to 20 cm $ Wound Debridement each add'l 20 sqcm: 6 $  PT PLS Gun and Tip: 1 Supply $PT Hydrotherapy Visit: 1 Visit       [name] 07/01/2021, 2:12 PM  Rolinda Roan, PT, DPT Acute Rehabilitation Services Pager: [phone] Office: [phone]

## 2021-07-01 NOTE — Progress Notes (Signed)
Physical Therapy Treatment Patient Details Name: Andres Braun MRN: [id number] DOB: Mar 15, 1976 Today's Date: 07/01/2021   History of Present Illness Pt is a 46 y.o. male admitted 05/24/21 after cardiac arrest at home; pt with agonal breathing, lost pulse, required 20-min CPR and 5 rounds epi. Workup for acute hypoxic respiratory failure, septic shock due to undertermined organism, AKI, acute metabolic encephalopathy, aspiration PNA of bilateral lower lobes due to vomit. ETT 1/15-1/19. CRRT 1/15-1/20. Brain MRI 1/22 negative. Cervical MRI 1/22 with small R-side disc protrusion C5-6. Abdominal CT showed muscular swelling, particularly iliopsoas and gluteal muscles consistent with rhabdomyolysis; hemorrhage noted within R iliopsoas. S/p diagnostic laparoscopy 1/24. MRI L femur 1/25 showed diffuse muscular abnormalities bilateral thighs with heterogenous T2 signal, especially within L glut max and gradratus femoris; findings compatible with dx of rhabdomyolysis, no drainable abscess identified; these findings could contribute to compartment syndrome. 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 Pt reporting more pain this morning as he did not get pain medication at 4 am. Discussed pt will not be given pain medication if he is asleep and that he needs to call if he wakes up. Continued conversation about nutrition, and assisted in ordering protein rich meals for today. Focus of session on self care and reliance, utilizing adaptive equipment to be able to place pillows under bilateral LE to float heels for sleep so that he will not need Prevalon boots that he reports are too hot. Pt able to come to standing with modAx2 where he continues to work on pregait activities. AFO to be ordered to  improve mobility with L foot drop. Goals reviewed and updated. D/c plans remain appropriate at this time.    Recommendations for follow up therapy are one component of a multi-disciplinary discharge planning process, led by the attending physician.  Recommendations may be updated based on patient status, additional functional criteria and insurance authorization.  Follow Up Recommendations  Skilled nursing-short term rehab (<3 hours/day)     Assistance Recommended at Discharge Frequent or constant Supervision/Assistance  Patient can return home with the following A lot of help with walking and/or transfers;Assistance with cooking/housework;Direct supervision/assist for medications management;Direct supervision/assist for financial management;Assist for transportation;Help with stairs or ramp for entrance;A lot of help with bathing/dressing/bathroom   Equipment Recommendations  Wheelchair (measurements PT);Wheelchair cushion (measurements PT);BSC/3in1;Hospital bed (drop arm BSC,)       Precautions / Restrictions Precautions Precautions: Fall;Other (comment) Restrictions Weight Bearing Restrictions: No     Mobility  Bed Mobility Overal bed mobility: Needs Assistance Bed Mobility: Supine to Sit, Sit to Supine     Supine to sit: Min guard Sit to supine: Min guard   General bed mobility comments: min guard for safety with coming to EoB with heavy use of UE, min guard for return to bed with pt utilizing L sock to assist in lifting LE back into bed.    Transfers Overall transfer level: Needs assistance Equipment used: Rolling walker (2 wheels) Transfers: Sit to/from Stand Sit to Stand: Mod assist, +2 physical assistance, Min assist           General transfer comment: modAx2 for power up and steadying, pt with much better UE assist to come to standing, still exhibiting bilateral knee buckling however able to push to upright and  lock R knee for stability, pt stands for close to 5  min working on weight shifting and assisted stepping.    Ambulation/Gait             Pre-gait activities: weight shifting and assisted stepping General Gait Details: working towards         Balance Overall balance assessment: Needs assistance Sitting-balance support: No upper extremity supported, Feet supported Sitting balance-Leahy Scale: Fair     Standing balance support: Bilateral upper extremity supported Standing balance-Leahy Scale: Poor Standing balance comment: requires outside support to maintain standing                            Cognition Arousal/Alertness: Awake/alert Behavior During Therapy: Flat affect Overall Cognitive Status: Impaired/Different from baseline Area of Impairment: Awareness, Problem solving, Safety/judgement                       Following Commands: Follows multi-step commands with increased time   Awareness: Anticipatory Problem Solving: Requires verbal cues, Requires tactile cues, Decreased initiation General Comments: better command follow and problem solving today        Exercises Other Exercises Other Exercises: self pillow placement for floating heels at night time with use of adaptive equipment x2    General Comments General comments (skin integrity, edema, etc.): Dr Avon Gully in room during session asked to order AFO for L foot drop, and he is in agreement. Also straightened out pt dietary restrictions and assisted in choosing meals with dietary aide.      Pertinent Vitals/Pain Pain Assessment Pain Assessment: 0-10 Pain Score: 8  Pain Location: LLE > RLE with mobility Pain Descriptors / Indicators: Grimacing, Guarding, Numbness Pain Intervention(s): Limited activity within patient's tolerance, Monitored during session, Repositioned     PT Goals (current goals can now be found in the care plan section) Acute Rehab PT Goals PT Goal Formulation: With patient Time For Goal Achievement:  07/15/21 Potential to Achieve Goals: Fair Progress towards PT goals: Progressing toward goals    Frequency    Min 3X/week (more if possible to progress him)      PT Plan Current plan remains appropriate    Co-evaluation PT/OT/SLP Co-Evaluation/Treatment: Yes Reason for Co-Treatment: For patient/therapist safety PT goals addressed during session: Mobility/safety with mobility        AM-PAC PT "6 Clicks" Mobility   Outcome Measure  Help needed turning from your back to your side while in a flat bed without using bedrails?: A Little Help needed moving from lying on your back to sitting on the side of a flat bed without using bedrails?: A Little Help needed moving to and from a bed to a chair (including a wheelchair)?: A Lot Help needed standing up from a chair using your arms (e.g., wheelchair or bedside chair)?: Total Help needed to walk in hospital room?: Total Help needed climbing 3-5 steps with a railing? : Total 6 Click Score: 11    End of Session Equipment Utilized During Treatment: Gait belt Activity Tolerance: Patient tolerated treatment well;Patient limited by pain Patient left: in bed;with call bell/phone within reach;Other (comment) (interpreter in room and hydro there for set up) Nurse Communication: Mobility status PT Visit Diagnosis: Muscle weakness (generalized) (M62.81);Difficulty in walking, not elsewhere classified (R26.2);Unsteadiness on feet (R26.81)     Time: [id number] PT Time Calculation (min) (ACUTE ONLY): 50 min  Charges:  $Therapeutic Activity: 8-22 mins  Bastian Andreoli B. Migdalia Dk PT, DPT Acute Rehabilitation Services Pager [phone] Office [phone]    [place] 07/01/2021, 11:10 AM

## 2021-07-01 NOTE — Progress Notes (Signed)
Orthopedic Tech Progress Note Patient Details:  Ridley Schewe [birth year] [id number]  Called in order to [facility] for an AFO   Patient ID: Zamauri Nez, male   DOB: 11-Aug-1975, 46 y.o.   MRN: [id number]  Donald Pore 07/01/2021, 10:23 AM

## 2021-07-01 NOTE — Progress Notes (Signed)
Occupational Therapy Treatment Patient Details Name: Andres Braun MRN: [id number] DOB: 09-15-1975 Today's Date: 07/01/2021   History of present illness Pt is a 46 y.o. male admitted 05/24/21 after cardiac arrest at home; pt with agonal breathing, lost pulse, required 20-min CPR and 5 rounds epi. Workup for acute hypoxic respiratory failure, septic shock due to undertermined organism, AKI, acute metabolic encephalopathy, aspiration PNA of bilateral lower lobes due to vomit. ETT 1/15-1/19. CRRT 1/15-1/20. Brain MRI 1/22 negative. Cervical MRI 1/22 with small R-side disc protrusion C5-6. Abdominal CT showed muscular swelling, particularly iliopsoas and gluteal muscles consistent with rhabdomyolysis; hemorrhage noted within R iliopsoas. S/p diagnostic laparoscopy 1/24. MRI L femur 1/25 showed diffuse muscular abnormalities bilateral thighs with heterogenous T2 signal, especially within L glut max and gradratus femoris; findings compatible with dx of rhabdomyolysis, no drainable abscess identified; these findings could contribute to compartment syndrome. 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  Patient seen with in-house Spanish interpreter Graciela.  Patient reported increased BLE pain this morning. Patient educated on placement of pillows under legs to address pain and float hills with use of reacher.  Patient educated on  sock aide to donn socks and dressing stick to doff.  Patient performed standing from EOB to address weight shifting. Patient continues to make gains. Acute OT to continue to follow with [facility] program.    Recommendations for follow up therapy are one component of a multi-disciplinary discharge planning process, led by the attending physician.  Recommendations may be updated based on patient status, additional functional criteria and  insurance authorization.    Follow Up Recommendations  Skilled nursing-short term rehab (<3 hours/day)    Assistance Recommended at Discharge Frequent or constant Supervision/Assistance  Patient can return home with the following  A lot of help with walking and/or transfers;A lot of help with bathing/dressing/bathroom;Assistance with cooking/housework;Assist for transportation;Direct supervision/assist for financial management;Direct supervision/assist for medications management;Help with stairs or ramp for entrance   Equipment Recommendations  BSC/3in1;Wheelchair (measurements OT);Wheelchair cushion (measurements OT)    Recommendations for Other Services      Precautions / Restrictions Precautions Precautions: Fall;Other (comment) Restrictions Weight Bearing Restrictions: No       Mobility Bed Mobility                    Transfers                         Balance                                           ADL either performed or assessed with clinical judgement   ADL Overall ADL's : Needs assistance/impaired                     Lower Body Dressing: Moderate assistance;Cueing for sequencing;With adaptive equipment;Sitting/lateral leans Lower Body Dressing Details (indicate cue type and reason): education on sock aide use for donning socks and dressing stick to doff.               General ADL Comments: AE training with reacher to position pillows under legs and for LB dressing.    Extremity/Trunk Assessment Upper Extremity Assessment RUE Coordination: decreased  fine motor;decreased gross motor            Vision       Perception     Praxis      Cognition                                                Exercises      Shoulder Instructions       General Comments Dr Avon Gully in room during session asked to order AFO for L foot drop, and he is in agreement. Also straightened out pt  dietary restrictions and assisted in choosing meals with dietary aide.    Pertinent Vitals/ Pain          Home Living                                          Prior Functioning/Environment              Frequency  Min 5X/week        Progress Toward Goals  OT Goals(current goals can now be found in the care plan section)  Progress towards OT goals: Progressing toward goals  Acute Rehab OT Goals Patient Stated Goal: go home OT Goal Formulation: With patient Time For Goal Achievement: 07/10/21 Potential to Achieve Goals: Good ADL Goals Pt Will Perform Grooming: with set-up;with supervision;sitting Pt Will Perform Upper Body Bathing: with set-up;with supervision;sitting Pt Will Perform Lower Body Bathing: with min assist;with adaptive equipment;sitting/lateral leans Pt Will Transfer to Toilet: with min assist;squat pivot transfer;bedside commode Pt Will Perform Toileting - Clothing Manipulation and hygiene: with min assist;sitting/lateral leans Additional ADL Goal #1: Pt will be Min A in and OOB for basic ADLs Additional ADL Goal #2: pt will complete bed level bed pain mod I with flat bed surface Additional ADL Goal #3: pt will complete w/c transfer mod I  Plan Discharge plan remains appropriate    Co-evaluation    PT/OT/SLP Co-Evaluation/Treatment: Yes Reason for Co-Treatment: For patient/therapist safety PT goals addressed during session: Mobility/safety with mobility OT goals addressed during session: ADL's and self-care      AM-PAC OT "6 Clicks" Daily Activity     Outcome Measure   Help from another person eating meals?: None Help from another person taking care of personal grooming?: A Little Help from another person toileting, which includes using toliet, bedpan, or urinal?: A Little Help from another person bathing (including washing, rinsing, drying)?: A Lot Help from another person to put on and taking off regular upper body clothing?:  A Little Help from another person to put on and taking off regular lower body clothing?: A Lot 6 Click Score: 17    End of Session Equipment Utilized During Treatment: Gait belt;Rolling walker (2 wheels)  OT Visit Diagnosis: Unsteadiness on feet (R26.81);Other abnormalities of gait and mobility (R26.89);Muscle weakness (generalized) (M62.81);Other symptoms and signs involving cognitive function;Pain Pain - Right/Left: Left Pain - part of body: Hip   Activity Tolerance Patient limited by pain   Patient Left in bed;with call bell/phone within reach   Nurse Communication Mobility status        Time: [id number] OT Time Calculation (min): 51 min  Charges: OT General Charges $OT Visit: 1 Visit OT Treatments $Self Care/Home Management :  23-37 mins  Lodema Hong, [place]  Pager [phone] Office [phone]   Trixie Dredge 07/01/2021, 11:36 AM

## 2021-07-02 MED ORDER — PROSOURCE PLUS PO LIQD
30.0000 mL | Freq: Four times a day (QID) | ORAL | Status: DC
  Administered 2021-07-02 – 2021-07-06 (×17): 30 mL via ORAL
  Filled 2021-07-02 (×17): qty 30

## 2021-07-02 MED ORDER — JUVEN PO PACK
1.0000 | PACK | Freq: Two times a day (BID) | ORAL | Status: DC
  Administered 2021-07-02 – 2021-07-06 (×9): 1 via ORAL
  Filled 2021-07-02 (×10): qty 1

## 2021-07-02 NOTE — Progress Notes (Signed)
Occupational Therapy Treatment Patient Details Name: Andres Braun MRN: [id number] DOB: 14-Sep-1975 Today's Date: 07/02/2021   History of present illness Pt is a 46 y.o. male admitted 05/24/21 after cardiac arrest at home; pt with agonal breathing, lost pulse, required 20-min CPR and 5 rounds epi. Workup for acute hypoxic respiratory failure, septic shock due to undertermined organism, AKI, acute metabolic encephalopathy, aspiration PNA of bilateral lower lobes due to vomit. ETT 1/15-1/19. CRRT 1/15-1/20. Brain MRI 1/22 negative. Cervical MRI 1/22 with small R-side disc protrusion C5-6. Abdominal CT showed muscular swelling, particularly iliopsoas and gluteal muscles consistent with rhabdomyolysis; hemorrhage noted within R iliopsoas. S/p diagnostic laparoscopy 1/24. MRI L femur 1/25 showed diffuse muscular abnormalities bilateral thighs with heterogenous T2 signal, especially within L glut max and gradratus femoris; findings compatible with dx of rhabdomyolysis, no drainable abscess identified; these findings could contribute to compartment syndrome. 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  Patient asked to use BSC and was min guard to get to EOB and min assist for squat pivot transfer to [facility].  Patient was supervision for safety fo toilet hygiene seated on BSC.  Patient performed transfer training with RW to recliner and back to EOB with mod assist +2 and verbal cues for weight shifting and WB on LLE. Patient was reliant on BUEs for transfers and was educated on importance of WB through BLEs to increase functional use. Patient continues to make good progress and will continue to be followed by Acute OT in [facility] program.    Recommendations for follow up therapy are one component of a  multi-disciplinary discharge planning process, led by the attending physician.  Recommendations may be updated based on patient status, additional functional criteria and insurance authorization.    Follow Up Recommendations  Skilled nursing-short term rehab (<3 hours/day)    Assistance Recommended at Discharge Frequent or constant Supervision/Assistance  Patient can return home with the following  A lot of help with walking and/or transfers;A lot of help with bathing/dressing/bathroom;Assistance with cooking/housework;Assist for transportation;Direct supervision/assist for financial management;Direct supervision/assist for medications management;Help with stairs or ramp for entrance   Equipment Recommendations  BSC/3in1;Wheelchair (measurements OT);Wheelchair cushion (measurements OT)    Recommendations for Other Services      Precautions / Restrictions Precautions Precautions: Fall;Other (comment) Precaution Comments: symptomatic orthostatic hypotension with standing Restrictions Weight Bearing Restrictions: No       Mobility Bed Mobility Overal bed mobility: Needs Assistance Bed Mobility: Supine to Sit, Sit to Supine     Supine to sit: Min guard Sit to supine: Min guard   General bed mobility comments: min guard for safety to get to EOB. Patient used sock on LLE to assist with getting LE back in bed with verbal cues to complete    Transfers Overall transfer level: Needs assistance Equipment used: Rolling walker (2 wheels) Transfers: Sit to/from Stand, Bed to chair/wheelchair/BSC Sit to Stand: Mod assist, +2 physical assistance, Min assist   Squat pivot transfers: Min assist Step pivot transfers: Mod assist, +2 physical assistance     General transfer comment: squat pivot transfer to [facility].  Address step transfer with RW requiring Mod assist +2 and cues for weight shifting     Balance Overall balance assessment: Needs assistance Sitting-balance support: No upper  extremity supported, Feet supported  Sitting balance-Leahy Scale: Fair     Standing balance support: Bilateral upper extremity supported Standing balance-Leahy Scale: Poor Standing balance comment: reliant on BUE support when standing                           ADL either performed or assessed with clinical judgement   ADL Overall ADL's : Needs assistance/impaired                 Upper Body Dressing : Minimal assistance;Sitting Upper Body Dressing Details (indicate cue type and reason): changed gown     Toilet Transfer: Minimal assistance;Squat-pivot;BSC/3in1 Toilet Transfer Details (indicate cue type and reason): min assist for squat pivot transfer to drop arm BSC and back to EOB Toileting- Clothing Manipulation and Hygiene: Supervision/safety;Sitting/lateral lean Toileting - Clothing Manipulation Details (indicate cue type and reason): able to perform toilet hygiene following BM while seated on BSC       General ADL Comments: improving with BSC transfer with squat pivot    Extremity/Trunk Assessment Upper Extremity Assessment RUE Coordination: decreased fine motor;decreased gross motor            Vision       Perception     Praxis      Cognition Arousal/Alertness: Awake/alert Behavior During Therapy: Flat affect Overall Cognitive Status: Impaired/Different from baseline Area of Impairment: Awareness, Problem solving, Safety/judgement                       Following Commands: Follows multi-step commands with increased time   Awareness: Anticipatory Problem Solving: Requires verbal cues, Requires tactile cues, Decreased initiation General Comments: disappointed over prognosis and expected length of time expected for full recover        Exercises      Shoulder Instructions       General Comments      Pertinent Vitals/ Pain       Pain Assessment Pain Assessment: Faces Faces Pain Scale: Hurts little more Pain Location: LLE >  RLE with mobility Pain Descriptors / Indicators: Grimacing, Guarding, Numbness Pain Intervention(s): Limited activity within patient's tolerance, Monitored during session, RN gave pain meds during session, Repositioned  Home Living                                          Prior Functioning/Environment              Frequency  Min 5X/week        Progress Toward Goals  OT Goals(current goals can now be found in the care plan section)  Progress towards OT goals: Progressing toward goals  Acute Rehab OT Goals Patient Stated Goal: get stronger OT Goal Formulation: With patient Time For Goal Achievement: 07/10/21 Potential to Achieve Goals: Good ADL Goals Pt Will Perform Grooming: with set-up;with supervision;sitting Pt Will Perform Upper Body Bathing: with set-up;with supervision;sitting Pt Will Perform Lower Body Bathing: with min assist;with adaptive equipment;sitting/lateral leans Pt Will Transfer to Toilet: with min assist;squat pivot transfer;bedside commode Pt Will Perform Toileting - Clothing Manipulation and hygiene: with min assist;sitting/lateral leans Additional ADL Goal #1: Pt will be Min A in and OOB for basic ADLs Additional ADL Goal #2: pt will complete bed level bed pain mod I with flat bed surface Additional ADL Goal #3: pt will complete w/c transfer mod I  Plan Discharge plan  remains appropriate    Co-evaluation    PT/OT/SLP Co-Evaluation/Treatment: Yes Reason for Co-Treatment: For patient/therapist safety;To address functional/ADL transfers PT goals addressed during session: Mobility/safety with mobility OT goals addressed during session: ADL's and self-care      AM-PAC OT "6 Clicks" Daily Activity     Outcome Measure   Help from another person eating meals?: None Help from another person taking care of personal grooming?: A Little Help from another person toileting, which includes using toliet, bedpan, or urinal?: A Little Help  from another person bathing (including washing, rinsing, drying)?: A Lot Help from another person to put on and taking off regular upper body clothing?: A Little Help from another person to put on and taking off regular lower body clothing?: A Lot 6 Click Score: 17    End of Session Equipment Utilized During Treatment: Gait belt;Rolling walker (2 wheels)  OT Visit Diagnosis: Unsteadiness on feet (R26.81);Other abnormalities of gait and mobility (R26.89);Muscle weakness (generalized) (M62.81);Other symptoms and signs involving cognitive function;Pain Pain - Right/Left: Left Pain - part of body: Hip   Activity Tolerance Patient limited by pain   Patient Left in bed;with call bell/phone within reach   Nurse Communication Mobility status        Time: 0910-1002 OT Time Calculation (min): 52 min  Charges: OT General Charges $OT Visit: 1 Visit OT Treatments $Self Care/Home Management : 8-22 mins $Therapeutic Activity: 8-22 mins  Lodema Hong, [place]  Pager [phone] Office [phone]   Trixie Dredge 07/02/2021, 10:58 AM

## 2021-07-02 NOTE — Plan of Care (Signed)
  Problem: Education: Goal: Knowledge of General Education information will improve Description: Including pain rating scale, medication(s)/side effects and non-pharmacologic comfort measures Outcome: Progressing   Problem: Health Behavior/Discharge Planning: Goal: Ability to manage health-related needs will improve Outcome: Progressing   Problem: Clinical Measurements: Goal: Ability to maintain clinical measurements within normal limits will improve Outcome: Progressing Goal: Will remain free from infection Outcome: Progressing Goal: Diagnostic test results will improve Outcome: Progressing Goal: Respiratory complications will improve Outcome: Progressing Goal: Cardiovascular complication will be avoided Outcome: Progressing   Problem: Activity: Goal: Risk for activity intolerance will decrease Outcome: Progressing   Problem: Coping: Goal: Level of anxiety will decrease Outcome: Progressing   Problem: Elimination: Goal: Will not experience complications related to bowel motility Outcome: Progressing Goal: Will not experience complications related to urinary retention Outcome: Progressing   Problem: Pain Managment: Goal: General experience of comfort will improve Outcome: Progressing   Problem: Skin Integrity: Goal: Risk for impaired skin integrity will decrease Outcome: Progressing   

## 2021-07-02 NOTE — Progress Notes (Signed)
Progress Note   Patient: Andres Braun KPT:[id number] DOB: [birth year] DOA: 05/24/2021     39 DOS: the patient was seen and examined on 07/02/2021   Brief hospital course: 46 year old male admitted to ICU 1/15 under PCCM care after cardiac arrest at home.  PEA arrest, s/p CPR x20 minutes and epi x5.  Patient reportedly out drinking with his friends the night prior to admission.  Found by friends with agonal breathing.  Intubated on arrival, needed vasopressors, noted to have renal failure, rhabdomyolysis with CK in the 14 K range, treated for aspiration pneumonia, nephrology consulted and started on CRRT.  Extubated 1/19 and off pressors.  Mental status slowly improved.  Transferred to telemetry.  Care transferred to [facility] on 1/24.  Nephrology following for HD needs.  General surgery on board for questionable small bowel ischemia and taken emergently to surgery 1/24. The bowel in question was found to be thickened, but viable. Hospital course complicated by unexplained left lower extremity weakness. Extensive work up of the neuraxis is negative for compressive pathology. Differential is probably a psoas hematoma causing lumbar plexus compression/ irritation leading to secondary weakness. IR suggested that it is not amenable for aspiration or drain placement. Neurology consulted, suggested not a neurological injury as the neuraxis is negative.    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  Confirmed by MRI of both thighs.  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 bid for muscle stiffness/hypertonicity Continue Lyrica for neuropathic as well as overall pain is well as scheduled Oxy IR Continue [facility] accelerated therapy program on Monday 2/20 2/21 CK has normalized, ESR down to 97-suspect exacerbation of rhabdo precipitated by last set of dosing of daptomycin which subsequently was discontinued in favor of oral antibiotics by ID-decrease IV fluids to 50 cc/h To improve mobility and transfer as well as affect therapy team is asked to allow off unit privileges. D-dimer elevated in the 3.5 range and this is likely from acute rhabdomyolosis.  AST slightly elevated.  Lower extremity venous duplex negative for DVT   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HCC) Bcx negative Cxr possible atelectasis See additional documentation under PVD and recent rhabdomyolysis ID has changed anbxs to PO doxy/augmentin for 16 days with first dose on 2/15. LD anbxs 07/10/21   Peripheral vascular disease of LEFT lower extremity w/ foot drop MRI of the neuraxis  was unremarkable for acute process . As per the MRI of the lumbar spine, multiple fluid collections in the right ileo psoas muscle.  Dr Benny Lennert spoke with neurology and neuro surgery on 1/24, no surgical cause for his lower extremity weakness. He suggested that a psoas abscess or hematoma could cause lumbar plexus compression /irritation leading to secondary weakness.   IR consulted for aspiration, suggested any aspiration would predispose to infection/ not amenable for aspiration or drain placement. Did not advise aspiration.  MRI of the femur c/w rhabdomyolysis, with the differential including myositis related to connective tissue disease and polymyositis. No drainable abscess identified. pt unable  to move the left lower extremity  from severe pain and weakness.  Orthopedics consulted, suggested its a neurological/ lumbar plexus ischemic injury.  Hoping for recovery with therapy evaluations.  Vitamin b12 and folate levels wnl. 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 The left toe-brachial index is abnormal.  Vascular evaluated on 2/15 and no acute vascular needs related to abnormal ABIs      Ischemic enteritis versus colitis with ileus-resolved as of 06/29/2021 General surgery consulted, diagnostic laparoscopy unremarkable  C. difficile PCR and GI panel negative    AKI (acute kidney injury)/dehydration secondary to persistent poor oral intake- (present on admission) with metabolic acidosis/rhabdomyolysis Acquired CRRT 1/15-1/19/2023. Nephrology had been following but renal function has recovered Redeveloped mild AKI and worsening thrombocytosis-evaluated by hematology AKI has resolved with hydration and correction of mild rhabdomyolosis Poor oral intake influencing hydration status.  IV fluids were at 150 an hour to treat elevated CK but CK has normalized therefore IV fluids decreased to 50 cc/h on 2/20   Aspiration pneumonia of both lower lobes due to vomit (HCC)-resolved as of 07/02/2021, (present on admission) Resolved Stable on room air  R anterior knee wound after placement IO access   2/14                              2/21     Abn ABIs/arterial flow contributing to slow healing Continue local wound care Superficial wound/eschar continues to degrade and expect will slough off per recommendations of vascular services and infectious disease team  Acute posthemorrhagic anemia-resolved as of 07/02/2021 possiblly from ?psoas hematoma Stool fob + on 1/21 , suspected from nonischemic colitis Has been given a total of 2 units PRBCs this admission Current hemoglobin 9.3  Severe protein-calorie  malnutrition (HCC) Nutrition Status: Nutrition Problem: Severe Malnutrition Etiology: acute illness (cardiac arrest, AKI, ileus) Signs/Symptoms: energy intake < or equal to 50% for > or equal to 5 days, percent weight loss (7.4% weight loss in less than 2 weeks) Percent weight loss: 7.4 % Interventions: Ensure Enlive (each supplement provides 350kcal and 20 grams of protein), Hormel Shake, MVI BMI 20.72 Patient having difficulty finding palatable protein sources.  Dietitian working with patient regarding choices and available products in the hospital As of 2/22 albumin is 2.5  Septic shock due to undetermined organism (HCC)-resolved as of 06/29/2021, (present on admission) Sepsis physiology resolved  Cardiac arrest with pulseless electrical activity ([place]) S/p PEA arrest, s/p CPR x 20 minutes and Epi x 5. Echocardiogram 1/15 with preserved LV function Not on a statin  Elevated liver enzymes Mild transaminitis has context of reemergence of mild rhabdomyollosis Of note CT this admission shows no evidence of cirrhosis  Situational depression Patient experiencing depression prior to admission due to dissolution of his marriage Further worsening of depression symptoms based on current physical debility and lack of clarity on recovery Zoloft initiated on 2/15  Thrombocytosis Reactive secondary to low iron and acute critical illness anemia Was given IV iron by hematology team who has subsequently signed off  Hyponatremia-resolved as of 07/01/2021 Improving.  Likely 2/2 aki  Acute respiratory failure with hypoxia and hypercapnia (HCC)-resolved as of 07/01/2021 Patient intubated on 05/24/2021 and extubated on 05/28/2021 .  patient completed course of antibiotics for aspiration pneumonia  2/3 currently on room air        Subjective:  Patient had questions regarding  the persistent numbness in his left leg.  Discussed with patient using in person bedside translator explained to patient  that due to his low circulation as well as recent issues with muscle damage.  Explained that we are hopeful that symptoms will resolve completely but this may take up to a year.  With his abnormal circulation he may have persistent numbness and will need to work within this issue.  Discussed safety issues to prevent injury.  Patient informed that he would eventually need to see a neurologist to have conduction studies done regarding muscle and possible nerve damage.  Patient more discouraged today regarding possibility of more long-term issues  Physical Exam: Vitals:   07/01/21 1809 07/01/21 2127 07/02/21 0500 07/02/21 0551  BP: (!) 140/98 (!) 137/97  (!) 141/102  Pulse: (!) 105 (!) 107  (!) 107  Resp: 18 18    Temp: 98.5 F (36.9 C) 98.2 F (36.8 C)  98.4 F (36.9 C)  TempSrc:  Oral    SpO2: 99% 100%  99%  Weight:   68.9 kg   Height:       General:  NAD, pleasant but became discouraged during conversation today Lungs:  Clear to auscultate bilaterally -room air Heart:  Regular rate and rhythm.  Without murmurs, rubs, or gallops. Abdomen:  Soft, nontender, nondistended.  Normoactive bowel sounds LBM 2/21 Vascular: Weak DP/pedal pulses bilaterally. Neurological: Cranial nerves II through XII grossly intact, moves all extremities x4.  Upper extremity strength 4-5/5.  Strength and movement in legs significantly improved.  Patient now able to flex legs at the hip with little to no pain in the thigh and hip area although still pain primarily with the left leg.  Strength on right 3-4/5 in strength on left 2-3/5.  Movement primarily restricted by pain in the hip and thigh area.  Continues to report numbness both lower extremities that he states was not present prior to admission Skin: Right lower extremity anterior wound from previous IO placement  Data Reviewed:  All results have been reviewed and noted within the progress note documentation  Family Communication:  Patient only utilizing  Spanish language translator  Disposition: Remains inpatient appropriate because:  Unsafe discharge plan Barriers to discharge: Patient will need to discharge to the home environment and based on current PT evaluation is not quite ready to discharge and maintain independence in a home setting.  He will not have 24/7 assistance once discharged.  Currently working on wheelchair transfers and other home-based activities.  His training has been initiated on 2/16   Planned Discharge Destination:  Barriers to discharge: Patient non-English-speaking and does not have payer source for rehabilitative therapies such as SNF and currently is not able to independently mobilize   A physical therapy consult is indicated based on the patients mobility assessment.   Mobility Assessment (last 72 hours)     Mobility Assessment     Row Name 07/01/21 2015 07/01/21 1100 07/01/21 1055   Does patient have an order for bedrest or is patient medically unstable No - Continue assessment -- No - Continue assessment   What is the highest level of mobility based on the progressive mobility assessment? Level 3 (Stands with assist) - Balance while standing  and cannot march in place Level 3 (Stands with assist) - Balance while standing  and cannot march in place Level 3 (Stands with assist) - Balance while standing  and cannot march in place   Is the above level different from baseline mobility prior  to current illness? Yes - Recommend PT order -- Yes - Recommend PT order    [place] Name 06/30/21 1000 06/29/21 1957 06/29/21 1100   Does patient have an order for bedrest or is patient medically unstable -- No - Continue assessment --   What is the highest level of mobility based on the progressive mobility assessment? Level 3 (Stands with assist) - Balance while standing  and cannot march in place Level 3 (Stands with assist) - Balance while standing  and cannot march in place Level 3 (Stands with assist) - Balance while  standing  and cannot march in place   Is the above level different from baseline mobility prior to current illness? -- Yes - Recommend PT order --           Medically stable Yes  COVID vaccination status:  Unknown  Consultants: Nephrology Neurology General surgery Orthopedic Infectious disease Procedures: Echocardiogram EEG Core track Temporary dialysis catheter Laparoscopic evaluation of abdomen in context of evaluation for ischemic bowel Antibiotics: Ceftriaxone 1/15 through 1/19 Ceftriaxone 1/21 through 1/23 Flagyl 1/21 through 1/23 Zosyn 1/24 through 1/26 Ancef 1/27 through 1/31 Vancomycin x1 dose on 2/1 Daptomycin 2/3 through 2/15 Cefepime 2/4 through 2/15 Doxycycline and Augmentin 2/15 with last doses due 3/3      Time spent: 25 minutes  Author: Erin Hearing, NP 07/02/2021 10:44 AM  For on call review [email or web address].

## 2021-07-02 NOTE — Progress Notes (Signed)
Physical Therapy Wound Treatment Patient Details  Name: Andres Braun MRN: [id number] Date of Birth: 1975/06/16  Today's Date: 07/02/2021 Time: 4010-2725 Time Calculation (min): 36 min  Subjective  Subjective Assessment Subjective: Andres Braun, in person interpreter, present throughout session. Pt pleasant and agreeable to hydrotherapy. Patient and Family Stated Goals: None stated Date of Onset:  (Unknown) Prior Treatments: Dressing changes with Santyl  Pain Score:  Pt reports minimal pain during session. States he was in some pain yesterday after session but went away when he took a nap.  Wound Assessment  Wound / Incision (Open or Dehisced) 05/28/21 Puncture Knee Anterior;Right (Active)  Dressing Type Foam - Lift dressing to assess site every shift;Gauze (Comment);Santyl;Barrier Film (skin prep);Moist to moist 07/02/21 1519  Dressing Changed Changed 07/02/21 1519  Dressing Status Clean, Dry, Intact 07/02/21 1519  Dressing Change Frequency Daily 07/02/21 1519  Site / Wound Assessment Pink;Yellow;Black 07/02/21 1519  % Wound base Red or Granulating 15% 07/02/21 1519  % Wound base Yellow/Fibrinous Exudate 55% 07/02/21 1519  % Wound base Black/Eschar 30% 07/02/21 1519  % Wound base Other/Granulation Tissue (Comment) 0% 07/02/21 1519  Peri-wound Assessment Intact 07/02/21 1519  Wound Length (cm) 11.5 cm 06/30/21 1000  Wound Width (cm) 12.5 cm 06/30/21 1000  Wound Depth (cm) 0.1 cm 06/30/21 1000  Wound Volume (cm^3) 14.38 cm^3 06/30/21 1000  Wound Surface Area (cm^2) 143.75 cm^2 06/30/21 1000  Tunneling (cm) 0 06/30/21 1441  Undermining (cm) 0 06/30/21 1441  Margins Unattached edges (unapproximated) 07/02/21 1519  Closure None 07/02/21 1519  Drainage Amount Scant 07/02/21 1519  Drainage Description Serosanguineous 07/02/21 1519  Non-staged Wound Description Partial thickness 06/27/21 0800  Treatment Debridement (Selective);Hydrotherapy (Pulse lavage);Packing (Saline gauze) 07/02/21  1519      Hydrotherapy Pulsed lavage therapy - wound location: R knee Pulsed Lavage with Suction (psi): 8 psi Pulsed Lavage with Suction - Normal Saline Used: 1000 mL Pulsed Lavage Tip: Tip with splash shield Selective Debridement Selective Debridement - Location: R knee Selective Debridement - Tools Used: Forceps, Scalpel Selective Debridement - Tissue Removed: Black eschar    Wound Assessment and Plan  Wound Therapy - Assess/Plan/Recommendations Wound Therapy - Clinical Statement: Progressing with debridement. Eschar appears to be softening. This patient will benefit from continued hydrotherapy for selective removal of unviable tissue, to decrease bioburden, and promote wound bed healing. Wound Therapy - Functional Problem List: Immobility Factors Delaying/Impairing Wound Healing: Multiple medical problems, Other (comment) (Poor nutrition) Hydrotherapy Plan: Debridement, Dressing change, Patient/family education, Pulsatile lavage with suction Wound Therapy - Frequency: 6X / week Wound Therapy - Follow Up Recommendations: dressing changes by family/patient  Wound Therapy Goals- Improve the function of patient's integumentary system by progressing the wound(s) through the phases of wound healing (inflammation - proliferation - remodeling) by: Wound Therapy Goals - Improve the function of patient's integumentary system by progressing the wound(s) through the phases of wound healing by: Decrease Necrotic Tissue to: 20% Decrease Necrotic Tissue - Progress: Progressing toward goal Increase Granulation Tissue to: 80% Increase Granulation Tissue - Progress: Progressing toward goal Goals/treatment plan/discharge plan were made with and agreed upon by patient/family: Yes Time For Goal Achievement: 7 days Wound Therapy - Potential for Goals: Good  Goals will be updated until maximal potential achieved or discharge criteria met.  Discharge criteria: when goals achieved, discharge from  hospital, MD decision/surgical intervention, no progress towards goals, refusal/missing three consecutive treatments without notification or medical reason.  GP     Charges PT Wound Care Charges $Wound Debridement up  to 20 cm: < or equal to 20 cm $ Wound Debridement each add'l 20 sqcm: 6 $PT PLS Gun and Tip: 1 Supply $PT Hydrotherapy Visit: 1 Visit       [name] 07/02/2021, 3:28 PM  Rolinda Roan, PT, DPT Acute Rehabilitation Services Pager: [phone] Office: [phone]

## 2021-07-02 NOTE — Progress Notes (Signed)
Physical Therapy Treatment Patient Details Name: Andres Braun MRN: [id number] DOB: 1975-08-29 Today's Date: 07/02/2021   History of Present Illness Pt is a 46 y.o. male admitted 05/24/21 after cardiac arrest at home; pt with agonal breathing, lost pulse, required 20-min CPR and 5 rounds epi. Workup for acute hypoxic respiratory failure, septic shock due to undertermined organism, AKI, acute metabolic encephalopathy, aspiration PNA of bilateral lower lobes due to vomit. ETT 1/15-1/19. CRRT 1/15-1/20. Brain MRI 1/22 negative. Cervical MRI 1/22 with small R-side disc protrusion C5-6. Abdominal CT showed muscular swelling, particularly iliopsoas and gluteal muscles consistent with rhabdomyolysis; hemorrhage noted within R iliopsoas. S/p diagnostic laparoscopy 1/24. MRI L femur 1/25 showed diffuse muscular abnormalities bilateral thighs with heterogenous T2 signal, especially within L glut max and gradratus femoris; findings compatible with dx of rhabdomyolysis, no drainable abscess identified; these findings could contribute to compartment syndrome. 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 Pt disappointed to learn that feeling and muscle activation in L LE may take up to a year to heal. Discussed need to use L LE in order to provide maximal opportunity for healing. Pt able to progress mobility today and take pivotal steps from bed to chair and chair to bed with modAx2. Increased cuing for use of L LE and L LE weightbearing despite numbness and weakness. D/c plan remains appropriate at this time. PT will continue to follow.     Recommendations for follow up therapy are one component of a multi-disciplinary discharge planning process, led by the attending physician.  Recommendations  may be updated based on patient status, additional functional criteria and insurance authorization.  Follow Up Recommendations  Skilled nursing-short term rehab (<3 hours/day)     Assistance Recommended at Discharge Frequent or constant Supervision/Assistance  Patient can return home with the following A lot of help with walking and/or transfers;Assistance with cooking/housework;Direct supervision/assist for medications management;Direct supervision/assist for financial management;Assist for transportation;Help with stairs or ramp for entrance;A lot of help with bathing/dressing/bathroom   Equipment Recommendations  Wheelchair (measurements PT);Wheelchair cushion (measurements PT);BSC/3in1;Hospital bed (drop arm BSC,)    Recommendations for Other Services       Precautions / Restrictions Precautions Precautions: Fall;Other (comment) Precaution Comments: symptomatic orthostatic hypotension with standing Restrictions Weight Bearing Restrictions: No     Mobility  Transfers: Sit to/from Stand, Bed to chair/wheelchair/BSC Sit to Stand: Mod assist, +2 physical assistance, Min assist   Step pivot transfers: Mod assist, +2 physical assistance Squat pivot transfers: Min assist     General transfer comment: squat pivot transfer to [facility].  Address step transfer with RW requiring Mod assist +2 and cues for weight shifting    Ambulation/Gait Ambulation/Gait assistance: Max assist, +2 physical assistance Gait Distance (Feet): 2 Feet Assistive device: Rolling walker (2 wheels) Gait Pattern/deviations: Step-to pattern, Decreased step length - right, Decreased step length - left, Decreased weight shift to left, Knees buckling, Antalgic, Narrow base of support Gait velocity: slowed Gait velocity interpretation: <1.31 ft/sec, indicative of household ambulator Pre-gait activities: weightshifting, marching in place General Gait Details: Pt able to take steps towards recliner on his R, with maximal  assist from therapist for extension of bilateral knees, in particular actually holding L knee for weightbearing to advance R LE. Pt quickly realized that he is able to use  UE to progress ambulation without use of L LE. PT encouraged L LE weightbearing for muscle formation       Balance Overall balance assessment: Needs assistance Sitting-balance support: No upper extremity supported, Feet supported Sitting balance-Leahy Scale: Fair     Standing balance support: Bilateral upper extremity supported Standing balance-Leahy Scale: Poor Standing balance comment: reliant on BUE support when standing                            Cognition Arousal/Alertness: Awake/alert Behavior During Therapy: Flat affect Overall Cognitive Status: Impaired/Different from baseline Area of Impairment: Awareness, Problem solving, Safety/judgement                       Following Commands: Follows multi-step commands with increased time   Awareness: Anticipatory Problem Solving: Requires verbal cues, Requires tactile cues, Decreased initiation General Comments: disappointed over prognosis and expected length of time expected for full recover           General Comments General comments (skin integrity, edema, etc.): pt very discourage when told that it may take as long as a year for pt to have good use of his L LE      Pertinent Vitals/Pain Pain Assessment Pain Assessment: Faces Faces Pain Scale: Hurts little more Pain Location: LLE > RLE with mobility Pain Descriptors / Indicators: Grimacing, Guarding, Numbness Pain Intervention(s): Limited activity within patient's tolerance, Monitored during session, Repositioned     PT Goals (current goals can now be found in the care plan section) Acute Rehab PT Goals PT Goal Formulation: With patient Time For Goal Achievement: 07/15/21 Potential to Achieve Goals: Fair Progress towards PT goals: Progressing toward goals    Frequency     Min 3X/week (more if possible to progress him)      PT Plan Current plan remains appropriate    Co-evaluation PT/OT/SLP Co-Evaluation/Treatment: Yes Reason for Co-Treatment: For patient/therapist safety PT goals addressed during session: Mobility/safety with mobility OT goals addressed during session: ADL's and self-care      AM-PAC PT "6 Clicks" Mobility   Outcome Measure  Help needed turning from your back to your side while in a flat bed without using bedrails?: A Little Help needed moving from lying on your back to sitting on the side of a flat bed without using bedrails?: A Little Help needed moving to and from a bed to a chair (including a wheelchair)?: A Lot Help needed standing up from a chair using your arms (e.g., wheelchair or bedside chair)?: Total Help needed to walk in hospital room?: Total Help needed climbing 3-5 steps with a railing? : Total 6 Click Score: 11    End of Session Equipment Utilized During Treatment: Gait belt Activity Tolerance: Patient tolerated treatment well;Patient limited by pain Patient left: in bed;with call bell/phone within reach;Other (comment) (interpreter in room and hydro there for set up) Nurse Communication: Mobility status PT Visit Diagnosis: Muscle weakness (generalized) (M62.81);Difficulty in walking, not elsewhere classified (R26.2);Unsteadiness on feet (R26.81)     Time: 0910-1003 PT Time Calculation (min) (ACUTE ONLY): 53 min  Charges:  $Gait Training: 8-22 mins                     Rani Sisney B. Migdalia Dk PT, DPT Acute Rehabilitation Services Pager [phone] Office [phone]    [place] 07/02/2021, 2:03 PM

## 2021-07-02 NOTE — Progress Notes (Signed)
Nutrition Follow-up  DOCUMENTATION CODES:  Severe malnutrition in context of acute illness/injury  INTERVENTION:  -Transition pt to assist with meal orders so meal preferences can be obtained (will need Spanish interpreter) -RD working with [facility] staff with hopes of transitioning pt to room service menu despite being on a unit with house menu -snacks TID -Carnation Instant Breakfast TID w/ meals (each packet provides 220 kcals and 13 grams protein prior to mixing) -Increase to 30 ml Prosource Plus QID, each supplement provides 100 kcals and 15 grams protein -Continue Renal MVI daily -will trial 1 packet Juven BID, each packet provides 95 calories, 2.5 grams of protein (collagen), and 9.8 grams of carbohydrate (3 grams sugar); also contains 7 grams of L-arginine and L-glutamine, 300 mg vitamin C, 15 mg vitamin E, 1.2 mcg vitamin B-12, 9.5 mg zinc, 200 mg calcium, and 1.5 g  Calcium Beta-hydroxy-Beta-methylbutyrate to support wound healing -discussed importance of adequate protein intake for wound healing  NUTRITION DIAGNOSIS:  Severe Malnutrition related to acute illness (cardiac arrest, AKI, ileus) as evidenced by energy intake < or equal to 50% for > or equal to 5 days, percent weight loss (7.4% weight loss in less than 2 weeks). -- ongoing  GOAL:  Patient will meet greater than or equal to 90% of their needs -- addressing with snacks and supplements   MONITOR:  PO intake, Supplement acceptance, Labs, Weight trends, I & O's, Skin  REASON FOR ASSESSMENT:  Ventilator, Consult Enteral/tube feeding initiation and management  ASSESSMENT:  46 yo male admitted post OOH cardiac arrest, acute respiratory failure post arrest, possible aspiration-intubated, AKI requiring CRRT. No PMH on file. Pt is spanish speaking  01/15 - admitted, intubated, CRRT initiated 01/16 - TTM initiated 01/19 - extubated, CRRT held 01/20 - diet advanced to Regular 01/21 - NPO due to concern for bowel ischemia 01/22  - full liquid diet, first HD treatment 01/23 - NPO 01/24 - s/p diagnostic laparoscopy revealing viable but thickened bowel without obstruction, second HD treatment 01/25 - clear liquid diet 01/26 - advanced to soft diet, third HD treatment 02/16 - diet advanced to regular 02/20 - Nephrology signed off as AKI resolved  RD discussed pt with RN, NP, SLP, and several other members of pt's care team. Pt with continued poor po intake due to limited appealing options on house menu. With help of interpreter, RD re-emphasized importance of adequate protein/calorie intake with pt and discussed food preferences/menu options with pt. RD will place order for snacks TID and will also work with [facility] staff in hopes of providing pt room service menu despite being on a unit with a house menu. Pt has tried Ensure, Nepro, and [employer] and does not like any of the available flavors. Pt has been tolerating Prosource Plus TID and is agreeable to receiving an additional dose (total of QID). Will trial [employer] in addition to Chocolate Magic Cups as pt has yet to try these. Will also trial Juven to aid in wound healing. Pt and NP agreeable with nutrition plan of care.   UOP: x24 hours I/O: -33.1L since admit  Current weight: 68.9 kg Admit weight: 93.5 kg   Medications: 87ml Prosource Plus TID, colace, abx, folvite, rena-vit, protonix, miralax, deltasone, sodium bicarbonate, LR @ 41ml/hr Labs reviewed.  Diet Order:   Diet Order             Diet regular Room service appropriate? Yes with Assist; Fluid consistency: Thin  Diet effective now  EDUCATION NEEDS:  Education needs have been addressed  Skin:  Skin Assessment: Skin Integrity Issues: Skin Integrity Issues:: Incisions, Other (Comment) Incisions: abdomen Other: rt knee puncture, sternal burn due to defibrilation pads  Last BM:  2/23  Height:  Ht Readings from Last 1 Encounters:  06/02/21 6' (1.829 m)    Weight:  Wt Readings from Last 1 Encounters:  07/02/21 68.9 kg   BMI:  Body mass index is 20.6 kg/m.  Estimated Nutritional Needs:  Kcal:  2200-2400 Protein:  120-135 grams Fluid:  >2L   Rae Lips., MS, RD, LDN (she/her/hers) RD pager number and weekend/on-call pager number located in Amion.

## 2021-07-03 NOTE — Progress Notes (Signed)
Progress Note   Patient: Andres Braun VOZ:[id number] DOB: 21-Aug-1975 DOA: 05/24/2021     40 DOS: the patient was seen and examined on 07/03/2021      Brief hospital course: 46 year old male admitted to ICU 1/15 under PCCM care after cardiac arrest at home.  PEA arrest, s/p CPR x20 minutes and epi x5.  Patient reportedly out drinking with his friends the night prior to admission.  Found by friends with agonal breathing.  Intubated on arrival, needed vasopressors, noted to have renal failure, rhabdomyolysis with CK in the 14 K range, treated for aspiration pneumonia, nephrology consulted and started on CRRT.  Extubated 1/19 and off pressors.  Mental status slowly improved.  Transferred to telemetry.  Care transferred to [facility] on 1/24.  Nephrology following for HD needs.  General surgery on board for questionable small bowel ischemia and taken emergently to surgery 1/24. The bowel in question was found to be thickened, but viable. Hospital course complicated by unexplained left lower extremity weakness. Extensive work up of the neuraxis is negative for compressive pathology. Differential is probably a psoas hematoma causing lumbar plexus compression/ irritation leading to secondary weakness. IR suggested that it is not amenable for aspiration or drain placement. Neurology consulted, suggested not a neurological injury as the neuraxis is negative.    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  Confirmed by MRI of both thighs.  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 bid for muscle stiffness/hypertonicity Continue Lyrica for neuropathic as well as overall pain is well as scheduled Oxy IR Continue [facility] accelerated therapy program on Monday 2/20 2/21 CK has normalized, ESR down to 97-suspect exacerbation of rhabdo precipitated by last set of dosing of daptomycin which subsequently was discontinued in favor of oral antibiotics by ID-decrease IV fluids to 50 cc/h To improve mobility and transfer as well as affect therapy team is asked to allow off unit privileges. Continue daily therapy       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HCC) Bcx negative Cxr possible atelectasis See additional documentation under PVD and recent rhabdomyolysis ID has changed anbxs to PO doxy/augmentin for 16 days with first dose on 2/15. LD anbxs 07/10/21   Peripheral vascular disease of LEFT lower extremity w/ foot drop MRI of the neuraxis  was unremarkable for acute process . As per the MRI of the lumbar spine, multiple fluid collections in the right ileo psoas muscle.  Dr Benny Lennert spoke with neurology and neuro surgery on 1/24, no surgical cause for his lower extremity weakness. He suggested that a psoas abscess or hematoma could cause lumbar plexus compression /irritation leading to secondary weakness.   IR consulted for aspiration, suggested any aspiration would predispose to infection/ not amenable for aspiration or drain placement. Did not advise aspiration.  MRI of the femur c/w rhabdomyolysis, with the differential including myositis related to connective tissue disease and polymyositis. No drainable abscess identified. pt unable to move the left lower extremity from severe pain and weakness.  Orthopedics consulted, suggested  its a neurological/ lumbar plexus ischemic injury.   Hoping for recovery with therapy evaluations.  Vitamin b12 and folate levels wnl. 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 The left toe-brachial index is abnormal.  Vascular evaluated on 2/15 and no acute vascular needs related to abnormal ABIs      Ischemic enteritis versus colitis with ileus-resolved as of 06/29/2021 General surgery consulted, diagnostic laparoscopy unremarkable  C. difficile PCR and GI panel negative    AKI (acute kidney injury)/dehydration secondary to persistent poor oral intake- (present on admission) with metabolic acidosis/rhabdomyolysis Acquired CRRT 1/15-1/19/2023. Nephrology had been following but renal function has recovered Redeveloped mild AKI and worsening thrombocytosis-evaluated by hematology AKI has resolved with hydration and correction of mild rhabdomyolosis Poor oral intake influencing hydration status.  IV fluids were at 150 an hour to treat elevated CK but CK has normalized therefore IV fluids decreased to 50 cc/h on 2/20   Aspiration pneumonia of both lower lobes due to vomit (HCC)-resolved as of 07/02/2021, (present on admission) Resolved Stable on room air  R anterior knee wound after placement IO access   2/14                              2/21        2/24     Abn ABIs/arterial flow contributing to slow healing Continue local wound care Superficial wound/eschar continues to degrade and expect will slough off per recommendations of vascular services and infectious disease team  Acute posthemorrhagic anemia-resolved as of 07/02/2021 possiblly from ?psoas hematoma Stool fob + on 1/21 , suspected from nonischemic colitis Has been given a total of 2 units PRBCs this admission Current hemoglobin 9.3  Severe protein-calorie malnutrition (HCC) Nutrition Status: Nutrition Problem: Severe Malnutrition Etiology: acute illness  (cardiac arrest, AKI, ileus) Signs/Symptoms: energy intake < or equal to 50% for > or equal to 5 days, percent weight loss (7.4% weight loss in less than 2 weeks) Percent weight loss: 7.4 % Interventions: Ensure Enlive (each supplement provides 350kcal and 20 grams of protein), Hormel Shake, MVI BMI 20.72 Patient having difficulty finding palatable protein sources.  Dietitian working with patient regarding choices and available products in the hospital As of 2/22 albumin is 2.5  Septic shock due to undetermined organism (HCC)-resolved as of 06/29/2021, (present on admission) Sepsis physiology resolved  Cardiac arrest with pulseless electrical activity ([place]) S/p PEA arrest, s/p CPR x 20 minutes and Epi x 5. Echocardiogram 1/15 with preserved LV function Not on a statin  Elevated liver enzymes Mild transaminitis has context of reemergence of mild rhabdomyollosis Of note CT this admission shows no evidence of cirrhosis  Situational depression Patient experiencing depression prior to admission due to dissolution of his marriage Further worsening of depression symptoms based on current physical debility and lack of clarity on recovery Zoloft initiated on 2/15  Thrombocytosis Reactive secondary to low iron and acute critical illness anemia Was given IV iron by hematology team who has subsequently signed off  Hyponatremia-resolved as of 07/01/2021 Improving.  Likely 2/2 aki  Acute respiratory failure with hypoxia and hypercapnia (HCC)-resolved as of 07/01/2021 Patient intubated on 05/24/2021 and extubated on 05/28/2021 .  patient completed course of antibiotics for aspiration pneumonia  2/3 currently on room air        Subjective:  Alert and smiling especially when I congratulated him on being out  of bed to the wheelchair.  Able to utilize arms to roll wheelchair around room and is doing so well.  Therapy taking patient into the bathroom to begin working on transfers from wheelchair  to toilet.  Physical Exam: Vitals:   07/02/21 1652 07/02/21 2100 07/03/21 0501 07/03/21 0857  BP: (!) 146/104 (!) 142/102 132/70 (!) 156/103  Pulse: (!) 109 (!) 113 (!) 120 (!) 110  Resp: _0 Temp: 98.4 F (36.9 C) 98.3 F (36.8 C) 98.7 F (37.1 C) 98.5 F (36.9 C)  TempSrc: Oral Oral Oral Oral  SpO2: 100% 100% 100% 100%  Weight:      Height:       General:  NAD, pleasant -much more excited today now that he is up mobilizing independently in the room with the wheelchair.  Working on transfers Lungs:  Clear to auscultate bilaterally -room air Heart:  Regular rate and rhythm.  Without murmurs, rubs, or gallops. Abdomen:  Soft, nontender, nondistended.  Normoactive bowel sounds LBM 2/24 Vascular: Weak DP/pedal pulses bilaterally. Neurological: Cranial nerves II through XII grossly intact, moves all extremities x4.  Upper extremity strength 4-5/5.  Strength and movement in legs significantly improved.  Patient now able to flex legs at the hip with little to no pain in the thigh and hip area although still pain primarily with the left leg.  Strength on right 3-4/5 in strength on left 2-3/5.  Movement primarily restricted by pain in the hip and thigh area.  Continues to report numbness both lower extremities that he states was not present prior to admission Skin: Right lower extremity anterior wound from previous IO placement  Data Reviewed:  All results have been reviewed and noted within the progress note documentation  Family Communication:  Patient only utilizing Spanish language translator  Disposition: Remains inpatient appropriate because:  Unsafe discharge plan Barriers to discharge: Patient will need to discharge to the home environment and based on current PT evaluation is not quite ready to discharge and maintain independence in a home setting.  He will not have 24/7 assistance once discharged.  Currently working on wheelchair transfers and other home-based  activities.  His training has been initiated on 2/16   Planned Discharge Destination:  Barriers to discharge: Patient non-English-speaking and does not have payer source for rehabilitative therapies such as SNF and currently is not able to independently mobilize   A physical therapy consult is indicated based on the patients mobility assessment.   Mobility Assessment (most recent)     Mobility Assessment - 07/03/21 1000     What is the highest level of mobility based on the progressive mobility assessment? Level 3 (Stands with assist) - Balance while standing  and cannot march in place              Medically stable Yes  COVID vaccination status:  Unknown  Consultants: Nephrology Neurology General surgery Orthopedic Infectious disease Procedures: Echocardiogram EEG Core track Temporary dialysis catheter Laparoscopic evaluation of abdomen in context of evaluation for ischemic bowel Antibiotics: Ceftriaxone 1/15 through 1/19 Ceftriaxone 1/21 through 1/23 Flagyl 1/21 through 1/23 Zosyn 1/24 through 1/26 Ancef 1/27 through 1/31 Vancomycin x1 dose on 2/1 Daptomycin 2/3 through 2/15 Cefepime 2/4 through 2/15 Doxycycline and Augmentin 2/15 with last doses due 3/3      Time spent: 25 minutes  Author: Erin Hearing, NP 07/03/2021 12:43 PM  For on call review [email or web address].

## 2021-07-03 NOTE — Progress Notes (Signed)
Per therapy notes, patient is making good progress with [facility] program. Patient still receiving hydrotherapy for his leg wound.  CSW to continue following for discharge planning.  Madilyn Fireman, MSW, LCSW Transitions of Care   Clinical Social Worker II [phone]

## 2021-07-03 NOTE — Progress Notes (Signed)
Occupational Therapy Treatment Patient Details Name: Andres Braun MRN: [id number] DOB: Oct 04, 1975 Today's Date: 07/03/2021   History of present illness Pt is a 46 y.o. male admitted 05/24/21 after cardiac arrest at home; pt with agonal breathing, lost pulse, required 20-min CPR and 5 rounds epi. Workup for acute hypoxic respiratory failure, septic shock due to undertermined organism, AKI, acute metabolic encephalopathy, aspiration PNA of bilateral lower lobes due to vomit. ETT 1/15-1/19. CRRT 1/15-1/20. Brain MRI 1/22 negative. Cervical MRI 1/22 with small R-side disc protrusion C5-6. Abdominal CT showed muscular swelling, particularly iliopsoas and gluteal muscles consistent with rhabdomyolysis; hemorrhage noted within R iliopsoas. S/p diagnostic laparoscopy 1/24. MRI L femur 1/25 showed diffuse muscular abnormalities bilateral thighs with heterogenous T2 signal, especially within L glut max and gradratus femoris; findings compatible with dx of rhabdomyolysis, no drainable abscess identified; these findings could contribute to compartment syndrome. 2/17 MRI B thighs c/w non specific myositis of hips and bilateral lower extremity- serological work up negative-begin prednisone 50 mg daily x5 days PMH Unknown   OT comments  Pt progressed to dressing with paper pants using reacher/ sock aide this session with cues for usage Min (A). Pt progressed with squat pivot to w/c on R side min (A) with cues to lock wheelchair. Pt lacks sensation and feeling to bil LE to keep LLE from dragging ground in wheelchair. A gait belt was used to help modify a temporary foot rest for bil Le. Pt cues to left LLE with BIL UE and place on strap. See PT note for further details. Recommendation remains appropriate.    Recommendations for follow up therapy are one component of a multi-disciplinary discharge planning process, led by the attending physician.  Recommendations may be updated based on patient status, additional  functional criteria and insurance authorization.    Follow Up Recommendations  Skilled nursing-short term rehab (<3 hours/day)    Assistance Recommended at Discharge Frequent or constant Supervision/Assistance  Patient can return home with the following  A lot of help with walking and/or transfers;A lot of help with bathing/dressing/bathroom;Assistance with cooking/housework;Assist for transportation;Direct supervision/assist for financial management;Direct supervision/assist for medications management;Help with stairs or ramp for entrance   Equipment Recommendations  BSC/3in1;Wheelchair (measurements OT);Wheelchair cushion (measurements OT)    Recommendations for Other Services Rehab consult    Precautions / Restrictions Precautions Precautions: Fall;Other (comment) Precaution Comments: orthostatic with standing/ Required Braces or Orthoses: Other Brace Other Brace: L AFO       Mobility Bed Mobility Overal bed mobility: Modified Independent             General bed mobility comments: requires bed rails that patient will not have at home. next session to take awa bed rails    Transfers Overall transfer level: Needs assistance Equipment used:  (w/c) Transfers: Bed to chair/wheelchair/BSC     Squat pivot transfers: Min guard       General transfer comment: pt with mod cues to set up the w/c correctly locking braces and does recall moving the arm rest out of the way indep. pt once in chair again cued to unlock brakes     Balance                                           ADL either performed or assessed with clinical judgement   ADL Overall ADL's : Needs assistance/impaired  Lower Body Dressing: Minimal assistance;Sitting/lateral leans Lower Body Dressing Details (indicate cue type and reason): pt utilized the sock aid to don sock appropriately. pt using reaching with min cues and guiding to dress LLE first prior to R  LE. Pt shifting R and L to attempt to don paper pants. pt requires standing with min (A) to fully pull up pants. OT attempting size 11 black shoes and size 10.5 shoes without ability to don. Pt reports having tennis shoes at home Toilet Transfer: Min Dispensing optician Details (indicate cue type and reason): pt self propelled wc into the bathroom. pt reports wheelchair will fit at home. pt progressed to bsc with cues to lock brakes   Toileting - Clothing Manipulation Details (indicate cue type and reason): able to lateral lean     Functional mobility during ADLs: Min guard;Wheelchair General ADL Comments: OT /PT using a gait belt to create a leg rest for LLE for wheelchair movement at this time. pt able to self lift with arms LLE and place on strap with mod A    Extremity/Trunk Assessment     Lower Extremity Assessment LLE Deficits / Details: reports wide shoe toe needed due to discomfort.        Vision       Perception     Praxis      Cognition Arousal/Alertness: Awake/alert Behavior During Therapy: Flat affect Overall Cognitive Status: Impaired/Different from baseline Area of Impairment: Safety/judgement, Awareness                         Safety/Judgement: Decreased awareness of safety, Decreased awareness of deficits Awareness: Anticipatory            Exercises      Shoulder Instructions       General Comments no complaints this session. pt does report shoes feel too tight so staff will again try to acquire shoes that could fit patient. Encouraged pt to talk to boss about bringing his personal shoes as pt is uncertain shoe size    Pertinent Vitals/ Pain       Pain Assessment Pain Assessment: No/denies pain  Home Living                                          Prior Functioning/Environment              Frequency  Min 5X/week        Progress Toward Goals  OT Goals(current goals can now be found in the care  plan section)  Progress towards OT goals: Progressing toward goals  Acute Rehab OT Goals Patient Stated Goal: asking about w/c to use at home OT Goal Formulation: With patient Time For Goal Achievement: 07/10/21 Potential to Achieve Goals: Good ADL Goals Pt Will Perform Grooming: with modified independence;sitting Pt Will Perform Upper Body Bathing: with set-up;sitting Pt Will Perform Lower Body Bathing: with set-up;sitting/lateral leans Pt Will Transfer to Toilet: with min guard assist;squat pivot transfer;bedside commode (bathroom with w/c) Pt Will Perform Toileting - Clothing Manipulation and hygiene: with min guard assist;sitting/lateral leans Additional ADL Goal #1: pt will complete bed mobility supervision with no bed rails and HOB flat to simulate home surface Additional ADL Goal #2: pt will complete bed level bed pain mod I with flat bed surface Additional ADL Goal #3: pt will complete w/c transfer mod I  Plan Discharge plan remains appropriate    Co-evaluation                 AM-PAC OT "6 Clicks" Daily Activity     Outcome Measure   Help from another person eating meals?: None Help from another person taking care of personal grooming?: A Little Help from another person toileting, which includes using toliet, bedpan, or urinal?: A Little Help from another person bathing (including washing, rinsing, drying)?: A Lot Help from another person to put on and taking off regular upper body clothing?: A Little Help from another person to put on and taking off regular lower body clothing?: A Little 6 Click Score: 18    End of Session Equipment Utilized During Treatment: Other (comment) (w/c)  OT Visit Diagnosis: Unsteadiness on feet (R26.81);Other abnormalities of gait and mobility (R26.89);Muscle weakness (generalized) (M62.81);Other symptoms and signs involving cognitive function;Pain Pain - Right/Left: Left Pain - part of body: Hip   Activity Tolerance Patient  tolerated treatment well   Patient Left Other (comment) (in w/c starting PT session)   Nurse Communication Mobility status        Time: [id number] OT Time Calculation (min): 39 min  Charges: OT General Charges $OT Visit: 1 Visit OT Treatments $Self Care/Home Management : 38-52 mins   Brynn, OTR/L  Acute Rehabilitation Services Pager: [phone] Office: [phone] .   Jeri Modena 07/03/2021, 9:59 AM

## 2021-07-03 NOTE — Progress Notes (Signed)
Occupational Therapy Treatment Patient Details Name: Andres Braun MRN: [id number] DOB: 06-24-1975 Today's Date: 07/03/2021   History of present illness Pt is a 46 y.o. male admitted 05/24/21 after cardiac arrest at home; pt with agonal breathing, lost pulse, required 20-min CPR and 5 rounds epi. Workup for acute hypoxic respiratory failure, septic shock due to undertermined organism, AKI, acute metabolic encephalopathy, aspiration PNA of bilateral lower lobes due to vomit. ETT 1/15-1/19. CRRT 1/15-1/20. Brain MRI 1/22 negative. Cervical MRI 1/22 with small R-side disc protrusion C5-6. Abdominal CT showed muscular swelling, particularly iliopsoas and gluteal muscles consistent with rhabdomyolysis; hemorrhage noted within R iliopsoas. S/p diagnostic laparoscopy 1/24. MRI L femur 1/25 showed diffuse muscular abnormalities bilateral thighs with heterogenous T2 signal, especially within L glut max and gradratus femoris; findings compatible with dx of rhabdomyolysis, no drainable abscess identified; these findings could contribute to compartment syndrome. 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 Pt educated to call for staff for safety but demonstrates good recall of locking brakes and positioning chair. Pt was able to complete peri care. Pt eating a sandwich in the chair at the end of session.   Ot measuring w/c to attempt to advocate for leg rest attachments to chair. Pt demonstrates good use of gait belt strap this session placing bil Le back on strap after transfer to chair.    Recommendations for follow up therapy are one component of a multi-disciplinary discharge planning process, led by the attending physician.  Recommendations may be updated based on patient status,  additional functional criteria and insurance authorization.    Follow Up Recommendations  Skilled nursing-short term rehab (<3 hours/day)    Assistance Recommended at Discharge Frequent or constant Supervision/Assistance  Patient can return home with the following  A lot of help with walking and/or transfers;A lot of help with bathing/dressing/bathroom;Assistance with cooking/housework;Assist for transportation;Direct supervision/assist for financial management;Direct supervision/assist for medications management;Help with stairs or ramp for entrance   Equipment Recommendations  BSC/3in1;Wheelchair (measurements OT);Wheelchair cushion (measurements OT)    Recommendations for Other Services Rehab consult    Precautions / Restrictions Precautions Precautions: Fall;Other (comment) Precaution Comments: symptomatic orthostatic hypotension with standing Required Braces or Orthoses: Other Brace Other Brace: L AFO       Mobility Bed Mobility Overal bed mobility: Modified Independent             General bed mobility comments: requires bed rails that patient will not have at home. next session to take awa bed rails    Transfers Overall transfer level: Needs assistance Equipment used:  (w/c) Transfers: Bed to chair/wheelchair/BSC     Squat pivot transfers: Min guard       General transfer comment: pt with mod cues to set up the w/c correctly locking braces and does recall moving the arm rest out of the way indep. pt once in chair again cued to unlock brakes     Balance                                           ADL either performed or assessed with clinical judgement   ADL Overall ADL's : Needs assistance/impaired  Toilet Transfer: Min guard;BSC/3in1 (w/c with squat pivot)   Toileting- Clothing Manipulation and Hygiene: Supervision/safety Toileting - Clothing Manipulation Details (indicate cue type and reason): lateral  lean for peri care clean up observed     Functional mobility during ADLs: Min guard;Wheelchair General ADL Comments: pt progressed to bathroom transfer then to sitting in w/c to eat lunch . pt eating sandwich in the room    Extremity/Trunk Assessment              Vision       Perception     Praxis      Cognition Arousal/Alertness: Awake/alert Behavior During Therapy: WFL for tasks assessed/performed Overall Cognitive Status: Impaired/Different from baseline Area of Impairment: Safety/judgement, Awareness                         Safety/Judgement: Decreased awareness of safety Awareness: Anticipatory   General Comments: pt completed bathroom transfer with w/c without calling staff. pt educated to have staff (A) at this time until further determined safe with trasnfers.        Exercises      Shoulder Instructions       General Comments OT measuring the leg rest distance between brackest and measureing 1 inch. pt attached part has prongs 1 cm wide. pt could benefit from elevated L LE leg rest for edema management    Pertinent Vitals/ Pain       Pain Assessment Pain Assessment: No/denies pain  Home Living                                          Prior Functioning/Environment              Frequency  Min 5X/week        Progress Toward Goals  OT Goals(current goals can now be found in the care plan section)  Progress towards OT goals: Progressing toward goals  Acute Rehab OT Goals Patient Stated Goal: asking if he can stay up sitting in w/c OT Goal Formulation: With patient Time For Goal Achievement: 07/17/21 Potential to Achieve Goals: Good ADL Goals Pt Will Perform Grooming: with modified independence;sitting Pt Will Perform Upper Body Bathing: with set-up;sitting Pt Will Perform Lower Body Bathing: with set-up;sitting/lateral leans Pt Will Transfer to Toilet: with min guard assist;squat pivot transfer;bedside  commode Pt Will Perform Toileting - Clothing Manipulation and hygiene: with min guard assist;sitting/lateral leans Additional ADL Goal #1: pt will complete bed mobility supervision with no bed rails and HOB flat to simulate home surface Additional ADL Goal #2: pt will complete bed level bed pain mod I with flat bed surface Additional ADL Goal #3: pt will complete w/c transfer mod I  Plan Discharge plan remains appropriate    Co-evaluation                 AM-PAC OT "6 Clicks" Daily Activity     Outcome Measure   Help from another person eating meals?: None Help from another person taking care of personal grooming?: A Little Help from another person toileting, which includes using toliet, bedpan, or urinal?: A Little Help from another person bathing (including washing, rinsing, drying)?: A Lot Help from another person to put on and taking off regular upper body clothing?: A Little Help from another person to put on and taking off regular lower body clothing?: A Little  6 Click Score: 18    End of Session    OT Visit Diagnosis: Unsteadiness on feet (R26.81);Other abnormalities of gait and mobility (R26.89);Muscle weakness (generalized) (M62.81);Other symptoms and signs involving cognitive function;Pain Pain - Right/Left: Left Pain - part of body: Hip   Activity Tolerance Patient tolerated treatment well   Patient Left Other (comment) (up in wheelchair eating lunch.)   Nurse Communication Mobility status        Time: 1400-1420 OT Time Calculation (min): 20 min  Charges: OT General Charges $OT Visit: 1 Visit OT Treatments $Self Care/Home Management : 8-22 mins   Brynn, OTR/L  Acute Rehabilitation Services Pager: [phone] Office: [phone] .   Jeri Modena 07/03/2021, 2:34 PM

## 2021-07-03 NOTE — Progress Notes (Signed)
Physical Therapy Treatment Patient Details Name: Andres Braun MRN: [id number] DOB: 01-29-1976 Today's Date: 07/03/2021   History of Present Illness Pt is a 46 y.o. male admitted 05/24/21 after cardiac arrest at home; pt with agonal breathing, lost pulse, required 20-min CPR and 5 rounds epi. Workup for acute hypoxic respiratory failure, septic shock due to undertermined organism, AKI, acute metabolic encephalopathy, aspiration PNA of bilateral lower lobes due to vomit. ETT 1/15-1/19. CRRT 1/15-1/20. Brain MRI 1/22 negative. Cervical MRI 1/22 with small R-side disc protrusion C5-6. Abdominal CT showed muscular swelling, particularly iliopsoas and gluteal muscles consistent with rhabdomyolysis; hemorrhage noted within R iliopsoas. S/p diagnostic laparoscopy 1/24. MRI L femur 1/25 showed diffuse muscular abnormalities bilateral thighs with heterogenous T2 signal, especially within L glut max and gradratus femoris; findings compatible with dx of rhabdomyolysis, no drainable abscess identified; these findings could contribute to compartment syndrome. 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 Focus of session wheelchair transfers, and UE strengthening for mobility. Pt utilized donated wheelchair, however it does not have foot rests and pt lacks LE strength to lift to keep especially L foot from dragging. Fashioned foot rest from gait belt however pt requires maxA for foot placement. Continue to work on obtaining correct size shoes for AFO placement. D/c plans remain appropriate at this time.      Recommendations for follow up therapy are one component of a multi-disciplinary discharge planning process, led by the attending physician.  Recommendations may be updated based on patient status,  additional functional criteria and insurance authorization.  Follow Up Recommendations  Skilled nursing-short term rehab (<3 hours/day)     Assistance Recommended at Discharge Frequent or constant Supervision/Assistance  Patient can return home with the following A lot of help with walking and/or transfers;Assistance with cooking/housework;Direct supervision/assist for medications management;Direct supervision/assist for financial management;Assist for transportation;Help with stairs or ramp for entrance;A lot of help with bathing/dressing/bathroom   Equipment Recommendations  Wheelchair (measurements PT);Wheelchair cushion (measurements PT);BSC/3in1    Recommendations for Other Services       Precautions / Restrictions Precautions Precautions: Fall;Other (comment) Precaution Comments: symptomatic orthostatic hypotension with standing Required Braces or Orthoses: Other Brace Other Brace: L AFO Restrictions Weight Bearing Restrictions: No     Mobility  Bed Mobility Overal bed mobility: Modified Independent             General bed mobility comments: utilizes pant leg and sock to return L LE to bed    Transfers Overall transfer level: Needs assistance Equipment used: Pushed w/c Transfers: Bed to chair/wheelchair/BSC       Squat pivot transfers: Min guard     General transfer comment: min guard for safety with pivot from wheelchair to bed            Architect Wheelchair mobility: Yes Wheelchair propulsion: Both upper extremities Wheelchair parts: Needs assistance Distance: 5000+ Wheelchair Assistance Details (indicate cue type and reason): pt using wheelchair that will be his at discharge and unfortunately it does not have leg rests. Therapy fashioned a leg rest out of a gait belt as pt does not have enought LE strength to lift feet to keep from dragging on ground. Pt requires maxA for placing LE in foot strap. Practiced  navigation over uneven surfaces, and in tight spaces  Modified Rankin (Stroke Patients Only)       Balance Overall balance assessment: Needs assistance Sitting-balance support: No upper extremity supported, Feet supported Sitting balance-Leahy Scale: Fair                                      Cognition Arousal/Alertness: Awake/alert Behavior During Therapy: Flat affect Overall Cognitive Status: Impaired/Different from baseline Area of Impairment: Safety/judgement, Awareness                       Following Commands: Follows multi-step commands with increased time Safety/Judgement: Decreased awareness of safety, Decreased awareness of deficits Awareness: Anticipatory Problem Solving: Requires verbal cues, Requires tactile cues, Decreased initiation General Comments: disappointed over prognosis and expected length of time expected for full recover           General Comments General comments (skin integrity, edema, etc.): Working on getting shoes that fit for placement of AFO. pt working with OT for dressing      Pertinent Vitals/Pain Pain Assessment Pain Assessment: No/denies pain     PT Goals (current goals can now be found in the care plan section) Acute Rehab PT Goals PT Goal Formulation: With patient Time For Goal Achievement: 07/15/21 Potential to Achieve Goals: Fair Progress towards PT goals: Progressing toward goals    Frequency    Min 5X/week (STAR)      PT Plan Current plan remains appropriate    Co-evaluation              AM-PAC PT "6 Clicks" Mobility   Outcome Measure  Help needed turning from your back to your side while in a flat bed without using bedrails?: None Help needed moving from lying on your back to sitting on the side of a flat bed without using bedrails?: None Help needed moving to and from a bed to a chair (including a wheelchair)?: A Little Help needed standing up from a chair using your arms (e.g.,  wheelchair or bedside chair)?: Total Help needed to walk in hospital room?: Total   6 Click Score: 13    End of Session   Activity Tolerance: Patient tolerated treatment well;Patient limited by pain Patient left: in bed;with call bell/phone within reach;Other (comment) (interpreter and therapist present for hydro session) Nurse Communication: Mobility status PT Visit Diagnosis: Muscle weakness (generalized) (M62.81);Difficulty in walking, not elsewhere classified (R26.2);Unsteadiness on feet (R26.81)     Time: [id number] PT Time Calculation (min) (ACUTE ONLY): 46 min  Charges:  $Therapeutic Activity: 8-22 mins $Wheel Chair Management: 23-37 mins                     Carlo Guevarra B. Migdalia Dk PT, DPT Acute Rehabilitation Services Pager [phone] Office [phone]    [place] 07/03/2021, 11:12 AM

## 2021-07-03 NOTE — Progress Notes (Signed)
Physical Therapy Wound Treatment Patient Details  Name: Leslee Haueter MRN: [id number] Date of Birth: [birth year]  Today's Date: 07/03/2021 Time: 5993-5701 Time Calculation (min): 31 min  Subjective  Subjective Assessment Subjective: [name], in person interpreter, present throughout session. Pt pleasant and agreeable to hydrotherapy. Patient and Family Stated Goals: None stated Date of Onset:  (Unknown) Prior Treatments: Dressing changes with Santyl  Pain Score:  Pt fell asleep during hydro session - no pain reported.   Wound Assessment  Wound / Incision (Open or Dehisced) 05/28/21 Puncture Knee Anterior;Right (Active)  Wound Image  Medial aspect view Lateral aspect view 07/03/21 1336  Dressing Type Foam - Lift dressing to assess site every shift;Gauze (Comment);Santyl;Barrier Film (skin prep);Moist to moist 07/03/21 1336  Dressing Changed Changed 07/03/21 1336  Dressing Status Clean, Dry, Intact 07/03/21 1336  Dressing Change Frequency Daily 07/03/21 1336  Site / Wound Assessment Pink;Yellow;Black 07/03/21 1336  % Wound base Red or Granulating 25% 07/03/21 1336  % Wound base Yellow/Fibrinous Exudate 55% 07/03/21 1336  % Wound base Black/Eschar 20% 07/03/21 1336  % Wound base Other/Granulation Tissue (Comment) 0% 07/03/21 1336  Peri-wound Assessment Intact 07/03/21 1336  Wound Length (cm) 11.5 cm 06/30/21 1000  Wound Width (cm) 12.5 cm 06/30/21 1000  Wound Depth (cm) 0.1 cm 06/30/21 1000  Wound Volume (cm^3) 14.38 cm^3 06/30/21 1000  Wound Surface Area (cm^2) 143.75 cm^2 06/30/21 1000  Tunneling (cm) 0 06/30/21 1441  Undermining (cm) 0 06/30/21 1441  Margins Unattached edges (unapproximated) 07/03/21 1336  Closure None 07/03/21 1336  Drainage Amount Scant 07/03/21 1336  Drainage Description Serosanguineous 07/03/21 1336  Non-staged Wound Description Partial thickness 06/27/21 0800  Treatment Debridement (Selective);Hydrotherapy (Pulse lavage);Packing (Saline gauze) 07/03/21  1336      Hydrotherapy Pulsed lavage therapy - wound location: R knee Pulsed Lavage with Suction (psi): 8 psi Pulsed Lavage with Suction - Normal Saline Used: 1000 mL Pulsed Lavage Tip: Tip with splash shield Selective Debridement Selective Debridement - Location: R knee Selective Debridement - Tools Used: Forceps, Scalpel Selective Debridement - Tissue Removed: Black eschar, yellow necrotic tissue    Wound Assessment and Plan  Wound Therapy - Assess/Plan/Recommendations Wound Therapy - Clinical Statement: Progressing with debridement and wound bed appears improved. This patient will benefit from continued hydrotherapy for selective removal of unviable tissue, to decrease bioburden, and promote wound bed healing. Wound Therapy - Functional Problem List: Immobility Factors Delaying/Impairing Wound Healing: Multiple medical problems, Other (comment) (Poor nutrition) Hydrotherapy Plan: Debridement, Dressing change, Patient/family education, Pulsatile lavage with suction Wound Therapy - Frequency: 6X / week Wound Therapy - Follow Up Recommendations: dressing changes by family/patient  Wound Therapy Goals- Improve the function of patient's integumentary system by progressing the wound(s) through the phases of wound healing (inflammation - proliferation - remodeling) by: Wound Therapy Goals - Improve the function of patient's integumentary system by progressing the wound(s) through the phases of wound healing by: Decrease Necrotic Tissue to: 20% Decrease Necrotic Tissue - Progress: Progressing toward goal Increase Granulation Tissue to: 80% Increase Granulation Tissue - Progress: Progressing toward goal Goals/treatment plan/discharge plan were made with and agreed upon by patient/family: Yes Time For Goal Achievement: 7 days Wound Therapy - Potential for Goals: Good  Goals will be updated until maximal potential achieved or discharge criteria met.  Discharge criteria: when goals achieved,  discharge from hospital, MD decision/surgical intervention, no progress towards goals, refusal/missing three consecutive treatments without notification or medical reason.  GP     Charges PT Wound Care Charges $  Wound Debridement each add'l 20 sqcm: 5 $PT PLS Gun and Tip: 1 Supply $PT Hydrotherapy Visit: 1 Visit       [name] 07/03/2021, 1:43 PM  Rolinda Roan, PT, DPT Acute Rehabilitation Services Pager: [phone] Office: [phone]

## 2021-07-04 DIAGNOSIS — L0390 Cellulitis, unspecified: Secondary | ICD-10-CM

## 2021-07-04 LAB — COMPREHENSIVE METABOLIC PANEL
ALT: 19 U/L (ref 0–44)
AST: 18 U/L (ref 15–41)
Albumin: 3.1 g/dL — ABNORMAL LOW (ref 3.5–5.0)
Alkaline Phosphatase: 131 U/L — ABNORMAL HIGH (ref 38–126)
Anion gap: 14 (ref 5–15)
BUN: 35 mg/dL — ABNORMAL HIGH (ref 6–20)
CO2: 20 mmol/L — ABNORMAL LOW (ref 22–32)
Calcium: 9.2 mg/dL (ref 8.9–10.3)
Chloride: 99 mmol/L (ref 98–111)
Creatinine, Ser: 0.88 mg/dL (ref 0.61–1.24)
GFR, Estimated: >60 mL/min (ref >60)
Glucose, Bld: 93 mg/dL (ref 70–99)
Potassium: 3.3 mmol/L — ABNORMAL LOW (ref 3.5–5.1)
Sodium: 133 mmol/L — ABNORMAL LOW (ref 135–145)
Total Bilirubin: 0.3 mg/dL (ref 0.3–1.2)
Total Protein: 7.2 g/dL (ref 6.5–8.1)

## 2021-07-04 MED ORDER — DIPHENHYDRAMINE HCL 25 MG PO CAPS
25.0000 mg | ORAL_CAPSULE | Freq: Every evening | ORAL | Status: DC | PRN
  Administered 2021-07-05: 25 mg via ORAL
  Filled 2021-07-04: qty 1

## 2021-07-04 NOTE — Progress Notes (Signed)
Physical Therapy Wound Treatment Patient Details  Name: Andres Braun MRN: [id number] Date of Birth: August 24, 1975  Today's Date: 07/04/2021 Time: 1001-1030 Time Calculation (min): 29 min  Subjective  Subjective Assessment Subjective: Stratus interpreter utilized Patient and Family Stated Goals: None stated Date of Onset:  (Unknown) Prior Treatments: Dressing changes with Santyl  Pain Score:  6/10 (premedicated)  Wound Assessment     Wound / Incision (Open or Dehisced) 05/28/21 Puncture Knee Anterior;Right (Active)  Dressing Type Foam - Lift dressing to assess site every shift;Gauze (Comment);Santyl;Barrier Film (skin prep);Moist to moist 07/04/21 1234  Dressing Changed Changed 07/04/21 1234  Dressing Status Clean, Dry, Intact 07/04/21 1234  Dressing Change Frequency Daily 07/04/21 1234  Site / Wound Assessment Pink;Yellow;Black 07/04/21 1234  % Wound base Red or Granulating 25% 07/04/21 1234  % Wound base Yellow/Fibrinous Exudate 55% 07/04/21 1234  % Wound base Black/Eschar 20% 07/04/21 1234  % Wound base Other/Granulation Tissue (Comment) 0% 07/04/21 1234  Peri-wound Assessment Intact 07/04/21 1234  Wound Length (cm) 11.5 cm 06/30/21 1000  Wound Width (cm) 12.5 cm 06/30/21 1000  Wound Depth (cm) 0.1 cm 06/30/21 1000  Wound Volume (cm^3) 14.38 cm^3 06/30/21 1000  Wound Surface Area (cm^2) 143.75 cm^2 06/30/21 1000  Tunneling (cm) 0 06/30/21 1441  Undermining (cm) 0 06/30/21 1441  Margins Unattached edges (unapproximated) 07/04/21 1234  Closure None 07/04/21 1234  Drainage Amount Scant 07/04/21 1234  Drainage Description Serosanguineous 07/04/21 1234  Non-staged Wound Description Partial thickness 06/27/21 0800  Treatment Debridement (Selective);Hydrotherapy (Pulse lavage);Packing (Saline gauze) 07/04/21 1234      Hydrotherapy Pulsed lavage therapy - wound location: R knee Pulsed Lavage with Suction (psi): 4 psi (for comfort) Pulsed Lavage with Suction - Normal Saline  Used: 1000 mL Pulsed Lavage Tip: Tip with splash shield Selective Debridement Selective Debridement - Location: R knee Selective Debridement - Tools Used: Forceps, Scalpel Selective Debridement - Tissue Removed: Black eschar, yellow necrotic tissue    Wound Assessment and Plan  Wound Therapy - Assess/Plan/Recommendations Wound Therapy - Clinical Statement: Wound bed continues to improve. Focus on sharp debridement of yellow slough and necrotic edges. Pt tolerated well. This patient will benefit from continued hydrotherapy for selective removal of unviable tissue, to decrease bioburden, and promote wound bed healing. Wound Therapy - Functional Problem List: Immobility Factors Delaying/Impairing Wound Healing: Multiple medical problems, Other (comment) (Poor nutrition) Hydrotherapy Plan: Debridement, Dressing change, Patient/family education, Pulsatile lavage with suction Wound Therapy - Frequency: 6X / week Wound Therapy - Follow Up Recommendations: dressing changes by family/patient  Wound Therapy Goals- Improve the function of patient's integumentary system by progressing the wound(s) through the phases of wound healing (inflammation - proliferation - remodeling) by: Wound Therapy Goals - Improve the function of patient's integumentary system by progressing the wound(s) through the phases of wound healing by: Decrease Necrotic Tissue to: 20% Decrease Necrotic Tissue - Progress: Progressing toward goal Increase Granulation Tissue to: 80% Increase Granulation Tissue - Progress: Progressing toward goal Goals/treatment plan/discharge plan were made with and agreed upon by patient/family: Yes Time For Goal Achievement: 7 days Wound Therapy - Potential for Goals: Good  Goals will be updated until maximal potential achieved or discharge criteria met.  Discharge criteria: when goals achieved, discharge from hospital, MD decision/surgical intervention, no progress towards goals, refusal/missing  three consecutive treatments without notification or medical reason.  GP     Charges PT Wound Care Charges $Wound Debridement up to 20 cm: < or equal to 20 cm $ Wound Debridement each  add'l 20 sqcm: 5 $PT PLS Gun and Tip: 1 Supply $PT Hydrotherapy Visit: 1 Visit     Wyona Almas, PT, DPT Acute Rehabilitation Services Pager [phone] Office [phone]   Deno Etienne 07/04/2021, 12:40 PM

## 2021-07-04 NOTE — Progress Notes (Signed)
PROGRESS NOTE    Andres Braun  [id number] DOB: 1976-05-02 DOA: 05/24/2021 PCP: Merryl Hacker, No   Brief Narrative:  46 year old male admitted to ICU 1/15 under PCCM care after cardiac arrest at home.  PEA arrest, s/p CPR x20 minutes and epi x5. Intubated on arrival, needed vasopressors, noted to have renal failure, rhabdomyolysis with CK in the 14 K range, treated for aspiration pneumonia, nephrology consulted and started on CRRT.  Extubated 1/19 and off pressors.  Mental status slowly improved.  Transferred to telemetry.  Care transferred to [facility] on 1/24.  Nephrology following for HD needs which thankfully were not needed with ongoing renal recovery.  General surgery on board for questionable small bowel ischemia and taken emergently to surgery 1/24 with no emergent issues. Hospital course complicated by unexplained left lower extremity weakness likely secondary to compressive pathology given profound rhabdo, prolonged down time, and subsequently noted psoas hematoma likely causing lumbar plexus compression/ irritation leading to secondary weakness. IR suggested that it is not amenable for aspiration or drain placement.   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   Situational depression   Ambulatory dysfunction (multifactorial) 2/2 rhabdomyolosis, underlying peripheral vascular disease, 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 Multiple fluid collections in the right ileo psoas muscle.  Lumbar plexus compression /irritation leading to secondary weakness secondary to fluid collection IR consulted space not amenable to drainage/aspiration ABI show moderate LLE arterial disease but not amenable to intervention per vascular   Ischemic enteritis versus colitis with ileus-resolved as of 06/29/2021 Based on imaging - emergent laparoscopy unremarkable   AKI (acute kidney injury)/dehydration secondary to persistent poor oral intake- (present on admission) with metabolic acidosis/rhabdomyolysis Acquired CRRT 1/15-1/19/2023. Nephrology signed off previously given renal recovery   Aspiration pneumonia of both lower lobes due to vomit, RESOLVED   R anterior knee wound after placement IO access Likely infiltrate of pressor/medications, resolving  Superficial wound/eschar continues to degrade and expect will slough off per recommendations of vascular services and infectious disease team   Acute posthemorrhagic anemia, resolved Stool fob + on 1/21 , suspected from nonischemic colitis Has been given a total of 2 units PRBCs this admission Current hemoglobin WNL   Severe protein-calorie malnutrition (HCC) Nutrition Status: Nutrition Problem: Severe Malnutrition Etiology: acute illness (cardiac arrest, AKI, ileus) Signs/Symptoms: energy intake < or equal to 50% for > or equal to 5 days, percent weight loss (7.4% weight  loss in less than 2 weeks) Percent weight loss: 7.4 % Interventions: Ensure Enlive (  each supplement provides 350kcal and 20 grams of protein), Hormel Shake, MVI BMI 20.72 Patient having difficulty finding palatable protein sources.  Dietitian working with patient regarding choices and available products in the hospital As of 2/22 albumin is 2.5   Septic shock due to undetermined organism (HCC)-resolved as of 06/29/2021, (present on admission) Sepsis physiology resolved   Cardiac arrest with pulseless electrical activity ([place]) S/p PEA arrest, s/p CPR x 20 minutes and Epi x 5. Echocardiogram 1/15 with preserved LV function Not on a statin   Elevated liver enzymes Mild transaminitis has context of reemergence of mild rhabdomyollosis Of note CT this admission shows no evidence of cirrhosis   Situational depression Patient experiencing depression prior to admission due to dissolution of his marriage Further worsening of depression symptoms based on current physical debility and lack of clarity on recovery Zoloft initiated on 2/15   Thrombocytosis Reactive secondary to low iron and acute critical illness anemia Was given IV iron by hematology team who has subsequently signed off   Hyponatremia-Resolved Continue to follow   Acute respiratory failure with hypoxia and hypercapnia Patient intubated on 05/24/2021 and extubated on 05/28/2021 . Remains on room air  DVT prophylaxis: Heparin Code Status: Full Family Communication: None present  Status is: Inpatient  Dispo: The patient is from: Home              Anticipated d/c is to: To be determined              Anticipated d/c date is: To be determined              Patient currently not medically stable for discharge  Subjective: No acute issues or events overnight denies nausea vomiting diarrhea constipation headache fevers chills or chest pain  Objective: Vitals:   07/03/21 1801 07/03/21 2038 07/04/21 0041 07/04/21 0454  BP: (!)  139/104 (!) 147/108 (!) 147/102 (!) 149/107  Pulse: (!) 107 (!) 127 (!) 108 (!) 103  Resp: 16 18 18 18   Temp: 98.7 F (37.1 C) 98.2 F (36.8 C)  98.6 F (37 C)  TempSrc: Oral   Oral  SpO2: 100% 97% 98% 98%  Weight:      Height:        Intake/Output Summary (Last 24 hours) at 07/04/2021 0742 Last data filed at 07/04/2021 0548 Gross per 24 hour  Intake 767 ml  Output 2301 ml  Net -1534 ml   Filed Weights   06/30/21 0441 07/01/21 0515 07/02/21 0500  Weight: 72.8 kg 69.6 kg 68.9 kg    Examination:  General:  Pleasantly resting in bed, No acute distress. HEENT:  Normocephalic atraumatic.  Sclerae nonicteric, noninjected.  Extraocular movements intact bilaterally. Lungs:  Clear to auscultate bilaterally without rhonchi, wheeze, or rales. Heart:  Regular rate and rhythm.  Without murmurs, rubs, or gallops. Abdomen:  Soft, nontender, nondistended.  Without guarding or rebound. Extremities: Right knee wound improving, lower extremity weakness ongoing  Data Reviewed: I have personally reviewed following labs and imaging studies  CBC: Recent Labs  Lab 06/28/21 0327 06/29/21 0143  WBC 9.1 12.9*  NEUTROABS 4.5 8.3*  HGB 8.7* 8.9*  HCT 28.3* 29.1*  MCV 84.5 84.6  PLT 735* [id number]   Basic Metabolic Panel: Recent Labs  Lab 06/28/21 0327 06/29/21 0143 07/04/21 0255  NA 136 134* 133*  K 3.2* 3.8 3.3*  CL 103 102 99  CO2 21* 21* 20*  GLUCOSE 95 106* 93  BUN 19 18 35*  CREATININE 0.91 0.85 0.88  CALCIUM 9.0 9.1 9.2   GFR: Estimated Creatinine Clearance: 103.3 mL/min (by C-G formula based on SCr of 0.88 mg/dL). Liver Function Tests: Recent Labs  Lab 06/28/21 0327 06/29/21 0143 07/04/21 0255  AST 42* 31 18  ALT 19 17 19   ALKPHOS 145* 139* 131*  BILITOT 0.5 0.5 0.3  PROT 7.0 6.9 7.2  ALBUMIN 2.5* 2.5* 3.1*   No results for input(s): LIPASE, AMYLASE in the last 168 hours. No results for input(s): AMMONIA in the last 168 hours. Coagulation Profile: No results for  input(s): INR, PROTIME in the last 168 hours. Cardiac Enzymes: Recent Labs  Lab 06/28/21 0327 06/29/21 0143 07/01/21 0353  CKTOTAL 465* 215 106   BNP (last 3 results) No results for input(s): PROBNP in the last 8760 hours. HbA1C: No results for input(s): HGBA1C in the last 72 hours. CBG: No results for input(s): GLUCAP in the last 168 hours. Lipid Profile: No results for input(s): CHOL, HDL, LDLCALC, TRIG, CHOLHDL, LDLDIRECT in the last 72 hours. Thyroid Function Tests: No results for input(s): TSH, T4TOTAL, FREET4, T3FREE, THYROIDAB in the last 72 hours. Anemia Panel: No results for input(s): VITAMINB12, FOLATE, FERRITIN, TIBC, IRON, RETICCTPCT in the last 72 hours. Sepsis Labs: No results for input(s): PROCALCITON, LATICACIDVEN in the last 168 hours.  No results found for this or any previous visit (from the past 240 hour(s)).       Radiology Studies: No results found.      Scheduled Meds:  (feeding supplement) PROSource Plus  30 mL Oral QID   amLODipine  5 mg Oral Daily   amoxicillin-clavulanate  1 tablet Oral Q12H   collagenase   Topical BID   docusate sodium  100 mg Oral BID   doxycycline  100 mg Oral [id number]   folic acid  1 mg Per Tube Daily   Gerhardt's butt cream   Topical TID   heparin injection (subcutaneous)  5,000 Units Subcutaneous Q8H   lidocaine  1 patch Transdermal Q24H   methocarbamol  750 mg Oral TID   multivitamin  1 tablet Oral QHS   nutrition supplement (JUVEN)  1 packet Oral BID BM   oxyCODONE  7.5 mg Oral Q4H   pantoprazole  40 mg Oral BID   polyethylene glycol  17 g Oral Daily   pregabalin  50 mg Oral TID   sertraline  25 mg Oral QHS   sodium bicarbonate  650 mg Oral Daily   Continuous Infusions:  sodium chloride 10 mL/hr at 06/25/21 1725     LOS: 41 days   Time spent: 6min  Delane Stalling C Daira Hine, DO [facility]  If 7PM-7AM, please contact night-coverage [email or web address]  07/04/2021, 7:42 AM

## 2021-07-04 NOTE — Plan of Care (Signed)
  Problem: Clinical Measurements: Goal: Ability to maintain clinical measurements within normal limits will improve Outcome: Progressing   

## 2021-07-05 NOTE — Progress Notes (Signed)
PROGRESS NOTE    Andres Braun  [id number] DOB: 08-Oct-1975 DOA: 05/24/2021 PCP: Merryl Hacker, No   Brief Narrative:  46 year old male admitted to ICU 1/15 under PCCM care after cardiac arrest at home.  PEA arrest, s/p CPR x20 minutes and epi x5. Intubated on arrival, needed vasopressors, noted to have renal failure, rhabdomyolysis with CK in the 14 K range, treated for aspiration pneumonia, nephrology consulted and started on CRRT.  Extubated 1/19 and off pressors.  Mental status slowly improved.  Transferred to telemetry.  Care transferred to [facility] on 1/24.  Nephrology following for HD needs which thankfully were not needed with ongoing renal recovery.  General surgery on board for questionable small bowel ischemia and taken emergently to surgery 1/24 with no emergent issues. Hospital course complicated by unexplained left lower extremity weakness likely secondary to compressive pathology given profound rhabdo, prolonged down time, and subsequently noted psoas hematoma likely causing lumbar plexus compression/ irritation leading to secondary weakness. IR suggested that it is not amenable for aspiration or drain placement.   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   Situational depression   Ambulatory dysfunction (multifactorial) 2/2 rhabdomyolosis, underlying peripheral vascular disease, 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 Multiple fluid collections in the right ileo psoas muscle.  Lumbar plexus compression /irritation leading to secondary weakness secondary to fluid collection IR consulted space not amenable to drainage/aspiration ABI show moderate LLE arterial disease but not amenable to intervention per vascular   Ischemic enteritis versus colitis with ileus-resolved as of 06/29/2021 Based on imaging - emergent laparoscopy unremarkable   AKI (acute kidney injury)/dehydration secondary to persistent poor oral intake- (present on admission) with metabolic acidosis/rhabdomyolysis Acquired CRRT 1/15-1/19/2023. Nephrology signed off previously given renal recovery   Aspiration pneumonia of both lower lobes due to vomit, RESOLVED   R anterior knee wound after placement IO access Likely infiltrate of pressor/medications, resolving  Superficial wound/eschar continues to degrade and expect will slough off per recommendations of vascular services and infectious disease team   Acute posthemorrhagic anemia, resolved Stool fob + on 1/21 , suspected from nonischemic colitis Has been given a total of 2 units PRBCs this admission Current hemoglobin WNL   Severe protein-calorie malnutrition (HCC) Nutrition Status: Nutrition Problem: Severe Malnutrition Etiology: acute illness (cardiac arrest, AKI, ileus) Signs/Symptoms: energy intake < or equal to 50% for > or equal to 5 days, percent weight loss (7.4% weight  loss in less than 2 weeks) Percent weight loss: 7.4 % Interventions: Ensure Enlive (  each supplement provides 350kcal and 20 grams of protein), Hormel Shake, MVI BMI 20.72 Patient having difficulty finding palatable protein sources.  Dietitian working with patient regarding choices and available products in the hospital As of 2/22 albumin is 2.5   Septic shock due to undetermined organism (HCC)-resolved as of 06/29/2021, (present on admission) Sepsis physiology resolved   Cardiac arrest with pulseless electrical activity ([place]) S/p PEA arrest, s/p CPR x 20 minutes and Epi x 5. Echocardiogram 1/15 with preserved LV function Not on a statin   Elevated liver enzymes Mild transaminitis has context of reemergence of mild rhabdomyollosis Of note CT this admission shows no evidence of cirrhosis   Situational depression Patient experiencing depression prior to admission due to dissolution of his marriage Further worsening of depression symptoms based on current physical debility and lack of clarity on recovery Zoloft initiated on 2/15   Thrombocytosis Reactive secondary to low iron and acute critical illness anemia Was given IV iron by hematology team who has subsequently signed off   Hyponatremia-Resolved Continue to follow   Acute respiratory failure with hypoxia and hypercapnia Patient intubated on 05/24/2021 and extubated on 05/28/2021 . Remains on room air  DVT prophylaxis: Heparin Code Status: Full Family Communication: None present  Status is: Inpatient  Dispo: The patient is from: Home              Anticipated d/c is to: To be determined              Anticipated d/c date is: To be determined              Patient currently not medically stable for discharge  Subjective: No acute issues or events overnight denies nausea vomiting diarrhea constipation headache fevers chills or chest pain  Objective: Vitals:   07/04/21 0939 07/04/21 1645 07/04/21 2100 07/05/21 0459  BP: (!)  147/113 (!) 142/101 122/88 (!) 124/100  Pulse: (!) 108 100 (!) 114 (!) 125  Resp: 19 18 19 18   Temp: 98.3 F (36.8 C) 98.3 F (36.8 C) 99.4 F (37.4 C) 98.5 F (36.9 C)  TempSrc: Oral Oral Oral Oral  SpO2: 100% 99% 100% 100%  Weight:      Height:        Intake/Output Summary (Last 24 hours) at 07/05/2021 0721 Last data filed at 07/05/2021 0504 Gross per 24 hour  Intake 1600 ml  Output 1675 ml  Net -75 ml    Filed Weights   06/30/21 0441 07/01/21 0515 07/02/21 0500  Weight: 72.8 kg 69.6 kg 68.9 kg    Examination:  General:  Pleasantly resting in bed, No acute distress. HEENT:  Normocephalic atraumatic.  Sclerae nonicteric, noninjected.  Extraocular movements intact bilaterally. Lungs:  Clear to auscultate bilaterally without rhonchi, wheeze, or rales. Heart:  Regular rate and rhythm.  Without murmurs, rubs, or gallops. Abdomen:  Soft, nontender, nondistended.  Without guarding or rebound. Extremities: Right knee wound improving, lower extremity weakness ongoing  Data Reviewed: I have personally reviewed following labs and imaging studies  CBC: Recent Labs  Lab 06/29/21 0143  WBC 12.9*  NEUTROABS 8.3*  HGB 8.9*  HCT 29.1*  MCV 84.6  PLT 728*    Basic Metabolic Panel: Recent Labs  Lab 06/29/21 0143 07/04/21 0255  NA 134* 133*  K 3.8 3.3*  CL 102 99  CO2 21* 20*  GLUCOSE 106* 93  BUN 18 35*  CREATININE 0.85 0.88  CALCIUM 9.1 9.2    GFR: Estimated Creatinine Clearance: 103.3 mL/min (by  C-G formula based on SCr of 0.88 mg/dL). Liver Function Tests: Recent Labs  Lab 06/29/21 0143 07/04/21 0255  AST 31 18  ALT 17 19  ALKPHOS 139* 131*  BILITOT 0.5 0.3  PROT 6.9 7.2  ALBUMIN 2.5* 3.1*    No results for input(s): LIPASE, AMYLASE in the last 168 hours. No results for input(s): AMMONIA in the last 168 hours. Coagulation Profile: No results for input(s): INR, PROTIME in the last 168 hours. Cardiac Enzymes: Recent Labs  Lab 06/29/21 0143  07/01/21 0353  CKTOTAL 215 106    BNP (last 3 results) No results for input(s): PROBNP in the last 8760 hours. HbA1C: No results for input(s): HGBA1C in the last 72 hours. CBG: No results for input(s): GLUCAP in the last 168 hours. Lipid Profile: No results for input(s): CHOL, HDL, LDLCALC, TRIG, CHOLHDL, LDLDIRECT in the last 72 hours. Thyroid Function Tests: No results for input(s): TSH, T4TOTAL, FREET4, T3FREE, THYROIDAB in the last 72 hours. Anemia Panel: No results for input(s): VITAMINB12, FOLATE, FERRITIN, TIBC, IRON, RETICCTPCT in the last 72 hours. Sepsis Labs: No results for input(s): PROCALCITON, LATICACIDVEN in the last 168 hours.  No results found for this or any previous visit (from the past 240 hour(s)).       Radiology Studies: No results found.      Scheduled Meds:  (feeding supplement) PROSource Plus  30 mL Oral QID   amLODipine  5 mg Oral Daily   amoxicillin-clavulanate  1 tablet Oral Q12H   collagenase   Topical BID   docusate sodium  100 mg Oral BID   doxycycline  100 mg Oral Q000111Q   folic acid  1 mg Per Tube Daily   Gerhardt's butt cream   Topical TID   heparin injection (subcutaneous)  5,000 Units Subcutaneous Q8H   lidocaine  1 patch Transdermal Q24H   methocarbamol  750 mg Oral TID   multivitamin  1 tablet Oral QHS   nutrition supplement (JUVEN)  1 packet Oral BID BM   oxyCODONE  7.5 mg Oral Q4H   pantoprazole  40 mg Oral BID   polyethylene glycol  17 g Oral Daily   pregabalin  50 mg Oral TID   sertraline  25 mg Oral QHS   sodium bicarbonate  650 mg Oral Daily   Continuous Infusions:  sodium chloride 10 mL/hr at 06/25/21 1725     LOS: 42 days   Time spent: 58min  Andres Bansal C Janeece Blok, DO [facility]  If 7PM-7AM, please contact night-coverage [email or web address]  07/05/2021, 7:21 AM

## 2021-07-05 NOTE — Progress Notes (Signed)
Transferred to ICU,staff found a little white plastic vial without any label ,looks like a medicine container,and informed this Clinical research associate.

## 2021-07-05 NOTE — Plan of Care (Signed)
  Problem: Clinical Measurements: Goal: Ability to maintain clinical measurements within normal limits will improve Outcome: Progressing   

## 2021-07-05 NOTE — Progress Notes (Signed)
As the nurse went out from RM 6.Patient was already on the side of the bed, stooping downward,with the help from the staffs placed back in bed, still restless ,Nurse noticed lips and nasal cyanosis,labor breathing with expiratory wheezes. MD paged ,STAT orders done and carried out.Narcan given ,patient become physically restless and aggressive .Posey and bilateral soft restraint applied.SWOT nurse ,rapid response and PMD at the bedside.

## 2021-07-06 ENCOUNTER — Other Ambulatory Visit (HOSPITAL_COMMUNITY): Payer: Self-pay

## 2021-07-06 MED ORDER — PANTOPRAZOLE SODIUM 40 MG PO TBEC
40.0000 mg | DELAYED_RELEASE_TABLET | Freq: Every day | ORAL | 2 refills | Status: AC
  Filled 2021-07-06: qty 30, 30d supply, fill #0

## 2021-07-06 MED ORDER — POLYETHYLENE GLYCOL 3350 17 GM/SCOOP PO POWD
17.0000 g | Freq: Every day | ORAL | 0 refills | Status: AC
  Filled 2021-07-06: qty 238, 14d supply, fill #0

## 2021-07-06 MED ORDER — COLLAGENASE 250 UNIT/GM EX OINT
TOPICAL_OINTMENT | Freq: Two times a day (BID) | CUTANEOUS | 0 refills | Status: AC
  Filled 2021-07-06: qty 30, 15d supply, fill #0

## 2021-07-06 MED ORDER — ACETAMINOPHEN 325 MG PO TABS: 650.0000 mg | ORAL_TABLET | Freq: Four times a day (QID) | ORAL | Status: AC | PRN

## 2021-07-06 MED ORDER — OXYCODONE HCL 5 MG PO TABS
7.5000 mg | ORAL_TABLET | Freq: Four times a day (QID) | ORAL | 0 refills | Status: AC | PRN
  Filled 2021-07-06: qty 45, 8d supply, fill #0

## 2021-07-06 MED ORDER — SODIUM BICARBONATE 650 MG PO TABS
650.0000 mg | ORAL_TABLET | Freq: Every day | ORAL | 2 refills | Status: AC
  Filled 2021-07-06: qty 30, 30d supply, fill #0

## 2021-07-06 MED ORDER — PREGABALIN 50 MG PO CAPS
50.0000 mg | ORAL_CAPSULE | Freq: Three times a day (TID) | ORAL | 2 refills | Status: AC
Start: 2021-07-06 — End: ?
  Filled 2021-07-06: qty 90, 30d supply, fill #0

## 2021-07-06 MED ORDER — DOCUSATE SODIUM 100 MG PO CAPS
100.0000 mg | ORAL_CAPSULE | Freq: Two times a day (BID) | ORAL | 0 refills | Status: AC
Start: 2021-07-06 — End: ?
  Filled 2021-07-06: qty 10, 5d supply, fill #0

## 2021-07-06 MED ORDER — DOXYCYCLINE HYCLATE 100 MG PO TABS
100.0000 mg | ORAL_TABLET | Freq: Two times a day (BID) | ORAL | 0 refills | Status: AC
  Filled 2021-07-06: qty 8, 4d supply, fill #0

## 2021-07-06 MED ORDER — AMOXICILLIN-POT CLAVULANATE 875-125 MG PO TABS
1.0000 | ORAL_TABLET | Freq: Two times a day (BID) | ORAL | 0 refills | Status: AC
Start: 2021-07-06 — End: 2021-07-11
  Filled 2021-07-06: qty 8, 4d supply, fill #0

## 2021-07-06 MED ORDER — AMLODIPINE BESYLATE 5 MG PO TABS
5.0000 mg | ORAL_TABLET | Freq: Every day | ORAL | 2 refills | Status: AC
Start: 2021-07-07 — End: ?
  Filled 2021-07-06: qty 30, 30d supply, fill #0

## 2021-07-06 MED ORDER — RENA-VITE PO TABS
1.0000 | ORAL_TABLET | Freq: Every day | ORAL | 2 refills | Status: AC
  Filled 2021-07-06: qty 30, 30d supply, fill #0

## 2021-07-06 MED ORDER — SERTRALINE HCL 25 MG PO TABS
25.0000 mg | ORAL_TABLET | Freq: Every day | ORAL | 2 refills | Status: AC
  Filled 2021-07-06: qty 30, 30d supply, fill #0

## 2021-07-06 MED ORDER — LIDOCAINE 5 % EX PTCH
1.0000 | MEDICATED_PATCH | CUTANEOUS | 0 refills | Status: AC
  Filled 2021-07-06: qty 30, 30d supply, fill #0

## 2021-07-06 MED ORDER — ALUM & MAG HYDROXIDE-SIMETH 200-200-20 MG/5ML PO SUSP
30.0000 mL | Freq: Once | ORAL | Status: AC
  Administered 2021-07-06: 30 mL via ORAL
  Filled 2021-07-06: qty 30

## 2021-07-06 MED ORDER — FOLIC ACID 1 MG PO TABS
1.0000 mg | ORAL_TABLET | Freq: Every day | ORAL | 2 refills | Status: AC
  Filled 2021-07-06: qty 30, 30d supply, fill #0

## 2021-07-06 MED ORDER — METHOCARBAMOL 750 MG PO TABS
750.0000 mg | ORAL_TABLET | Freq: Three times a day (TID) | ORAL | 2 refills | Status: AC
  Filled 2021-07-06: qty 90, 30d supply, fill #0

## 2021-07-06 NOTE — Progress Notes (Signed)
CSW spoke with Sammy at [employer] to schedule transportation home tomorrow. Pickup time is 12:30pm at the [employer] [employer]. Patient will transport in the wheelchair he is keeping.  Edwin Dada, MSW, LCSW Transitions of Care   Clinical Social Worker II [phone]

## 2021-07-06 NOTE — Discharge Summary (Addendum)
Physician Discharge Summary   Patient: Andres Braun MRN: [id number] DOB: May 21, 1975  Admit date:     05/24/2021  Discharge date: 07/07/2021  Discharge Physician: Erin Hearing   PCP: Pcp, No   Recommendations at discharge:   He will need to follow-up with [facility].  Previous appointment scheduled for 2/23 at 1 PM but patient had remained in the hospital.  Patient needs to call contact number to reschedule appointment Patient has an appointment to establish with [facility] regarding new PCP.  This appointment is scheduled for March 8 at 9:30 AM Ambulatory referral will be sent to the neurology clinic for follow-up appointment related to multifactorial lower extremity weakness.  Outpatient EMG is recommended DME will be provided by [employer] [facility] Patient will continue wound care to right knee as follows: Cleanse right knee with normal saline and/or soap and water.  Pat dry.  Apply nickel thick layer of Santyl over the wound.  Cover with moistened but not wet saline gauze.  Cover that with dry gauze and secure with foam dressing.  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HCC)   Peripheral vascular disease of LEFT lower extremity w/ foot drop   AKI (acute kidney injury)/dehydration secondary to persistent poor oral intake   R anterior knee wound after placement IO access   Severe protein-calorie malnutrition ([place])   Cardiac arrest with pulseless electrical activity (HCC)   Elevated liver enzymes   Situational depression  Resolved Problems:   Ischemic enteritis versus colitis with ileus   Aspiration pneumonia of both lower lobes due to vomit ([place])   Acute posthemorrhagic anemia   Septic shock due to  undetermined organism [facility])   Pneumonia   Hospital Course: 46 year old male admitted to ICU 1/15 under PCCM care after cardiac arrest at home.  PEA arrest, s/p CPR x20 minutes and epi x5.  Patient reportedly out drinking with his friends the night prior to admission.  Found by friends with agonal breathing.  Intubated on arrival, needed vasopressors, noted to have renal failure, rhabdomyolysis with CK in the 14 K range, treated for aspiration pneumonia, nephrology consulted and started on CRRT.  Extubated 1/19 and off pressors.  Mental status slowly improved.  Transferred to telemetry.  Care transferred to [facility] on 1/24.  Nephrology following for HD needs.  General surgery on board for questionable small bowel ischemia and taken emergently to surgery 1/24. The bowel in question was found to be thickened, but viable. Hospital course complicated by unexplained left lower extremity weakness. Extensive work up of the neuraxis is negative for compressive pathology. Differential is probably a psoas hematoma causing lumbar plexus compression/ irritation leading to secondary weakness. IR suggested that it is not amenable for aspiration or drain placement. Neurology consulted, suggested not a neurological injury as the neuraxis is negative.    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  Confirmed by MRI of both thighs.  CK is subsequently renormalized with IV fluids and utilization of pulse dose prednisone.  Redeveloped elevated CK with associated myositis that responded to  fluids and steroids. 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  Patient asking to be discharged home as of 2/27  2/27 anticipate discharge home 2/28.  Therapy will work with patient regarding home skills such as standing at counter to place food in the microwave oven.   A physical therapy consult is indicated based on the patients mobility assessment.   Mobility Assessment (last 72 hours)     Mobility Assessment     Row Name 07/06/21 2008 07/06/21 1300 07/06/21 1100 07/05/21 2351 07/05/21 2251   Does patient have an order for bedrest or is patient medically unstable No - Continue assessment -- -- No - Continue assessment No - Continue assessment   What is the highest level of mobility based on the progressive mobility assessment? Level 4 (Walks with assist in room) - Balance while marching in place and cannot step forward and back - Complete Level 4 (Walks with assist in room) - Balance while marching in place and cannot step forward and back - Complete Level 5 (Walks with assist in room/hall) - Balance while stepping forward/back and can walk in room with assist - Complete Level 5 (Walks with assist in room/hall) - Balance while stepping forward/back and can walk in room with assist - Complete Level 5 (Walks with assist in room/hall) - Balance while stepping forward/back and can walk in room with assist - Complete   Is the above level different from baseline mobility prior to current illness? Yes - Recommend PT order -- -- No - Consider discontinuing PT/OT No - Consider discontinuing PT/OT    Row Name 07/05/21 0800 07/04/21 2030         Does patient have an order for bedrest or is patient  medically unstable No - Continue assessment No - Continue assessment      What is the highest level of mobility based on the progressive mobility assessment? Level 5 (Walks with assist in room/hall) - Balance while stepping forward/back and can walk in room with assist - Complete Level 5 (Walks with assist in room/hall) - Balance while stepping forward/back and can walk in room with assist - Complete      Is the above level different from baseline mobility prior to current illness? Yes - Recommend PT order No - Consider discontinuing PT/OT                 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HCC) Bcx negative Cxr possible atelectasis See additional documentation under PVD and recent rhabdomyolysis ID has changed anbxs to PO doxy/augmentin for 16 days with first dose on 2/15. LD anbxs 07/10/21   Peripheral vascular disease of LEFT lower extremity w/ foot drop MRI of the neuraxis  was unremarkable for acute process . As per the MRI of the lumbar spine, multiple fluid collections in the right ileo psoas muscle.  Dr Benny Lennert spoke with neurology and neuro surgery on 1/24, no surgical cause for his lower extremity weakness. He suggested that a psoas abscess or hematoma could cause lumbar plexus compression /irritation leading to secondary weakness.   IR consulted for aspiration, suggested any aspiration would predispose to infection/ not amenable for aspiration or drain placement. Did not advise aspiration.  MRI of the femur c/w rhabdomyolysis, with the differential including myositis related to connective tissue disease and polymyositis. No drainable abscess identified. pt unable to move the left lower extremity from severe pain and weakness.  Orthopedics consulted, suggested its a neurological/ lumbar plexus ischemic injury.  Hoping for recovery with therapy evaluations.  Vitamin b12 and folate levels wnl. Arterial duplex w/ ABIs this admission as follows Left: Resting left ankle-brachial index indicates moderate  left lower extremity arterial disease. The left toe-brachial index is abnormal.  Vascular evaluated on 2/15 and no acute vascular needs related to abnormal ABIs      Ischemic enteritis versus colitis with ileus-resolved as of 06/29/2021 General surgery consulted, diagnostic laparoscopy unremarkable  C. difficile PCR and GI panel negative    AKI (acute kidney injury)/dehydration secondary to persistent poor oral intake- (present on admission) with metabolic acidosis/rhabdomyolysis Acquired CRRT 1/15-1/19/2023. Nephrology had been following but renal function has recovered Redeveloped mild AKI and worsening thrombocytosis-evaluated by hematology AKI has resolved with hydration and correction of mild rhabdomyolosis    Aspiration pneumonia of both lower lobes due to vomit (HCC)-resolved as of 07/02/2021, (present on admission) Resolved Stable on room air  R anterior knee wound after placement IO access   2/14                              2/21        2/24  2/27 Continue wound care daily.  Patient will apply a nickel thick layer of Santyl over the wound cover with a moistened saline gauze then cover with dry gauze and secure with a foam dressing. 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 Has been given a total of 2 units PRBCs this admission Current hemoglobin 9.3  Severe protein-calorie malnutrition (HCC) Nutrition Status: Nutrition Problem: Severe Malnutrition Etiology: acute illness (cardiac arrest, AKI, ileus) Signs/Symptoms: energy intake < or equal to 50% for > or equal to 5 days, percent weight loss (7.4% weight loss in less than 2 weeks) Percent weight loss: 7.4 % Interventions: Ensure Enlive (each supplement provides 350kcal and 20 grams of protein), Hormel Shake, MVI BMI 20.72 As of 2/22 albumin is 2.5  Septic  shock due to undetermined organism (HCC)-resolved as of 06/29/2021, (present on admission) Sepsis physiology resolved  Cardiac arrest with pulseless electrical activity ([place]) S/p PEA arrest in the context of significant metabolic acidosis, s/p CPR x 20 minutes and Epi x 5. Echocardiogram 1/15 with preserved LV function Not on a statin  Elevated liver enzymes Mild transaminitis has context of reemergence of mild rhabdomyollosis Of note CT this admission shows no evidence of cirrhosis  Situational depression Patient experiencing depression prior to admission due to dissolution of his marriage Further worsening of depression symptoms based on current physical debility and lack of clarity on recovery Zoloft initiated on 2/15  Thrombocytosis Reactive secondary to low iron and acute critical illness anemia Was given IV iron by hematology team who has subsequently signed off  Hyponatremia-resolved as of 07/01/2021 Improving.  Likely 2/2 aki  Acute respiratory failure with hypoxia and hypercapnia (HCC)-resolved as of 07/01/2021 Patient intubated on 05/24/2021 and extubated on 05/28/2021 .  patient completed course of antibiotics for aspiration pneumonia  2/3 currently on room air           Consultants:  Nephrology Neurology General  surgery Orthopedic Infectious disease Procedures performed:  Echocardiogram EEG Core track Temporary dialysis catheter Laparoscopic evaluation of abdomen in context of evaluation for ischemic bowel  Antibiotics: Ceftriaxone 1/15 through 1/19 Ceftriaxone 1/21 through 1/23 Flagyl 1/21 through 1/23 Zosyn 1/24 through 1/26 Ancef 1/27 through 1/31 Vancomycin x1 dose on 2/1 Daptomycin 2/3 through 2/15 Cefepime 2/4 through 2/15 Doxycycline and Augmentin 2/15 with last doses due 3/3   Disposition: Home Diet recommendation:  Regular diet  DISCHARGE MEDICATION: Allergies as of 07/07/2021   No Known Allergies      Medication List     TAKE  these medications    acetaminophen 325 MG tablet Commonly known as: TYLENOL Take 2 tablets (650 mg total) by mouth every 6 (six) hours as needed for fever.   amLODipine 5 MG tablet Commonly known as: NORVASC Tome 1 tableta (5 mg en total) por va oral diariamente. (Take 1 tablet (5 mg total) by mouth daily.)   amoxicillin-clavulanate 875-125 MG tablet Commonly known as: AUGMENTIN Take 1 tablet by mouth every 12 (twelve) hours for 4 days.   docusate sodium 100 MG capsule Commonly known as: COLACE Take 1 capsule (100 mg total) by mouth 2 (two) times daily.   doxycycline 100 MG tablet Commonly known as: VIBRA-TABS Take 1 tablet (100 mg total) by mouth every 12 (twelve) hours for 4 days.   folic acid 1 MG tablet Commonly known as: FOLVITE Place 1 tablet (1 mg total) into feeding tube daily.   lidocaine 5 % Commonly known as: LIDODERM Place 1 patch onto the skin daily. Remove & Discard patch within 12 hours or as directed by MD   methocarbamol 750 MG tablet Commonly known as: ROBAXIN Take 1 tablet (750 mg total) by mouth 3 (three) times daily.   multivitamin Tabs tablet Tome 1 tableta por va oral antes de acostarse. (Take 1 tablet by mouth at bedtime.)   oxyCODONE 5 MG immediate release tablet Commonly known as: Oxy IR/ROXICODONE Take 1.5 tablets (7.5 mg total) by mouth every 6 (six) hours as needed for severe pain.   pantoprazole 40 MG tablet Commonly known as: PROTONIX Tome 1 tableta (40 mg en total) por va oral diariamente. (Take 1 tablet (40 mg total) by mouth daily.)   polyethylene glycol powder 17 GM/SCOOP powder Commonly known as: GLYCOLAX/MIRALAX Take 17 g by mouth daily.   pregabalin 50 MG capsule Commonly known as: LYRICA Take 1 capsule (50 mg total) by mouth 3 (three) times daily.   Santyl ointment Generic drug: collagenase Apply topically 2 (two) times daily.   sertraline 25 MG tablet Commonly known as: ZOLOFT Tome 1 tableta (25 mg en total) por  va oral antes de acostarse. (Take 1 tablet (25 mg total) by mouth at bedtime.)   sodium bicarbonate 650 MG tablet Tome 1 tableta (650 mg en total) por va oral diariamente. (Take 1 tablet (650 mg total) by mouth daily.)               Durable Medical Equipment  (From admission, onward)           Start     Ordered   07/06/21 1110  For home use only DME Other see comment  Once       Comments: Bilateral lower leg supports for existing wheelchair  Question:  Length of Need  Answer:  Lifetime   07/06/21 1110              Discharge Care Instructions  (From admission, onward)  Start     Ordered   07/07/21 0000  Discharge wound care:       Comments: Cleanse right knee with normal saline and/or soap and water.  Pat dry.  Apply nickel thick layer of Santyl over the wound.  Cover with moistened but not wet saline gauze.  Cover that with dry gauze and secure with foam dressing.  Change dressing daily.   07/07/21 1122            Follow-up Information     [facility] [place] Follow up on 07/02/2021.   Specialty: General Surgery Why: 2/23 at 1:30pm. Please bring a copy of your photo ID and insurance card. Please arrive 30 minutes prior to your appointment for paperwork. Contact information: [place] [address] [phone]         [place]. Go to.   Why: You are scheduled for a hospital follow up on July 15, 2021 at 9:30 am. Contact information: [place] [phone]        [facility] Follow up.   Why: [facility] is providing wheelchair foot rests for your wheelchair if available and your 3:1 will be delivered to your home for your commode. Contact information: [address] [place] [address] [phone]                 Discharge Exam: Filed Weights   06/30/21 0441 07/01/21 0515  07/02/21 0500  Weight: 72.8 kg 69.6 kg 68.9 kg   General:  NAD, pleasant -much more excited today now that he is up mobilizing independently in the room with the wheelchair.  Working on transfers Lungs:  Clear to auscultate bilaterally -room air Heart:  Regular rate and rhythm.  Without murmurs, rubs, or gallops. Abdomen:  Soft, nontender, nondistended.  Normoactive bowel sounds LBM 2/27 Vascular: Weak DP/pedal pulses bilaterally. Neurological: Cranial nerves II through XII grossly intact, moves all extremities x4.  Upper extremity strength 5/5.  Strength and movement in legs significantly improved.  Patient now able to flex legs at the hip with little to no pain in the thigh and hip area although still pain primarily with the left leg.  Strength on right 4/5 in strength on left 3/5.  Movement primarily restricted by pain in the hip and thigh area.  Continues to report numbness both lower extremities that he states was not present prior to admission Skin: Right lower extremity anterior wound from previous IO placement  Condition at discharge: stable  The results of significant diagnostics from this hospitalization (including imaging, microbiology, ancillary and laboratory) are listed below for reference.   Imaging Studies: DG Tibia/Fibula Right  Result Date: 06/23/2021 CLINICAL DATA:  Osteomyelitis EXAM: No evidence RIGHT TIBIA AND FIBULA - 2 VIEW COMPARISON:  None. FINDINGS: There is no evidence of fracture or other focal bone lesions. Of osteomyelitis. Soft tissue swelling of the anterior distal thigh. IMPRESSION: Negative for fracture or osteomyelitis. Soft tissue swelling distal thigh muscles. Electronically Signed   By: Franchot Gallo M.D.   On: 06/23/2021 16:56   CT PELVIS W CONTRAST  Result Date: 06/13/2021 CLINICAL DATA:  Psoas abscess EXAM: CT PELVIS WITH CONTRAST TECHNIQUE: Multidetector CT imaging of the pelvis was performed using the standard protocol following the bolus  administration of intravenous contrast. RADIATION DOSE REDUCTION: This exam was performed according to the departmental dose-optimization program which includes automated exposure control, adjustment of  the mA and/or kV according to patient size and/or use of iterative reconstruction technique. CONTRAST:  67mL OMNIPAQUE IOHEXOL 300 MG/ML  SOLN COMPARISON:  CT 06/01/2021, MRI 05/31/2021 FINDINGS: Urinary Tract:  No abnormality visualized. Bowel: Mild diverticulosis of the distal descending and proximal sigmoid colon. Moderate stool within the distal colon without evidence of obstruction. Previously noted circumferential thickening of the terminal ileum appears improved on the current examination. No evidence of focal inflammation. Appendix normal. No ascites. Vascular/Lymphatic: No pathologically enlarged lymph nodes. No significant vascular abnormality seen. Reproductive:  The prostate gland is unremarkable. Other: Diffuse subcutaneous body wall edema extending into the visualized upper extremities bilaterally has progressed slightly in the interval since prior examination. Trace retroperitoneal edema has also developed within the retroperitoneum surrounding the psoas bilaterally, within the space of Retzius, and within the presacral space. Heterogeneous attenuation and subtle thickening of the right iliopsoas musculature is again identified and appears stable since prior examination. 3 cm targetoid lesion within the right psoas at axial image # 43/3 is in compatible with the previously identified intramuscular hematoma and appears decreased in size since prior examination. No loculated retroperitoneal fluid collection identified. Musculoskeletal: No suspicious bone lesions identified. IMPRESSION: Interval decrease in size in intramuscular hematoma within the right psoas with stable associated minimal asymmetric muscular thickening. No loculated retroperitoneal fluid collections identified. Progressive anasarca.  Improved circumferential bowel wall thickening involving the terminal ileum and cecum. Mild distal colonic diverticulosis without superimposed focal inflammatory change. Electronically Signed   By: Fidela Salisbury M.D.   On: 06/13/2021 19:39   MR LUMBAR SPINE WO CONTRAST  Result Date: 06/09/2021 CLINICAL DATA:  Left leg complete paralysis EXAM: MRI LUMBAR SPINE WITHOUT CONTRAST TECHNIQUE: Multiplanar, multisequence MR imaging of the lumbar spine was performed. No intravenous contrast was administered. COMPARISON:  05/31/2021 FINDINGS: Segmentation:  Standard. Alignment:  Physiologic. Vertebrae: No acute fracture or suspicious osseous lesion. No evidence of discitis. Conus medullaris and cauda equina: Conus extends to the L1-L2 level. Conus and cauda equina appear normal. Paraspinal and other soft tissues: Redemonstrated enlargement of the right iliopsoas muscle with a complex fluid collection, which is incompletely imaged but measures up to 3.3 x 2.6 cm in the axial plane, previously 3.4 x 2.6 cm when remeasured similarly. This fluid collection is now T1 isointense with a T1 hyperintense rim and T2 hyperintense with a T2 hypointense rim, previously T1 and T2 hypointense. Multiple smaller collections are also noted, the most prominent of which measures up to 0.7 cm (series 8, image 37), likely unchanged in size, with signal characteristics similar to the larger collection. Disc levels: T12-L1: No significant disc bulge. No spinal canal stenosis or neural foraminal narrowing. L1-L2: No significant disc bulge. No spinal canal stenosis or neural foraminal narrowing. L2-L3: No significant disc bulge. No spinal canal stenosis or neural foraminal narrowing. L3-L4: Small central disc protrusion. Mild facet arthropathy. Narrowing of the lateral recesses. No spinal canal stenosis or neural foraminal narrowing. L4-L5: Mild disc bulge. Mild facet arthropathy. No spinal canal stenosis or neural foraminal narrowing. L5-S1:  Mild disc bulge with central annular fissure. No spinal canal stenosis or neural foraminal narrowing. IMPRESSION: 1. Redemonstrated collections in the right iliopsoas muscle, the largest of which measures up to 3.3 cm, unchanged from the prior exam when remeasured similarly. These may represent evolving hematomas, given change in signal characteristics. 2. No spinal canal stenosis or significant neural foraminal narrowing. Electronically Signed   By: Merilyn Baba M.D.   On: 06/09/2021 20:11   MR  [facility] RIGHT WO CONTRAST  Result Date: 06/27/2021 CLINICAL DATA:  suspected inflammatory myositis both legs EXAM: MRI OF THE RIGHT FEMUR WITHOUT CONTRAST TECHNIQUE: Multiplanar, multisequence MR imaging of the right femur was performed. No intravenous contrast was administered. COMPARISON:  Pelvis CT 06/13/2021. FINDINGS: Bones/Joint/Cartilage No evidence of acute fracture. There is partially visualized marrow edema within the proximal tibias bilaterally. There is a large heterogeneous right knee joint effusion. Muscles and Tendons There is extensive intramuscular edema of the right hip and thigh. Edema seen within the iliopsoas muscle, adductor muscles, vastus muscles in the anterior compartment. There is no focal intramuscular collection suggest to suggest myonecrosis or pyomyositis. There is interfascial fluid in the anterior compartment. On the large field-of-view images, there is also proximal intramuscular edema along the left hip and proximal thigh. Soft tissues There is subcutaneous soft tissue swelling along the lateral thigh. IMPRESSION: Extensive intramuscular edema of the proximal hips and thighs, with interfascial fluid proximally, and extending more distally in the right thigh in the anterior compartment. Subcutaneous edema laterally. 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 No focal fluid collection to suggest myonecrosis or pyomyositis. Heterogeneous right knee joint effusion and partially  visualized heterogeneous marrow signal within the proximal right tibia. Similar heterogeneous marrow signal within the left proximal tibia to a slightly lesser degree with small left knee effusion. These findings are nonspecific however concerning for possible septic arthritis of the right knee joint. The right knee joint effusion could potentially be reactive but heterogeneity is concerning. Consider joint aspiration of the right knee. Electronically Signed   By: Maurine Simmering M.D.   On: 06/27/2021 13:54   MR FEMUR LEFT WO CONTRAST  Result Date: 06/28/2021 CLINICAL DATA:  Severe lower extremity pain EXAM: MR OF THE LEFT FEMUR WITHOUT CONTRAST TECHNIQUE: Multiplanar, multisequence MR imaging of the left femur was performed. No intravenous contrast was administered. COMPARISON:  Pelvis CT 06/13/2021 FINDINGS: Bones/Joint/Cartilage There is no evidence of acute fracture. Partially visualized marrow heterogeneity within the proximal tibias bilaterally. Heterogeneous right knee effusion with blooming artifact. No significant left hip effusion. Muscles and Tendons There is extensive intramuscular edema of the left hip and thigh. Edema is seen within the iliopsoas muscle, abductor muscles, proximal anterior compartment musculature, and left gluteal musculature. No significant muscle atrophy. No focal intramuscular collection to suggest myonecrosis or pyomyositis. There is inter fascial fluid in the anterior compartment and rim of fluid along the superficial surface of the vastus lateralis muscle which is favored to be reactive. Soft tissues Extensive subcutaneous soft tissue swelling of the thigh. IMPRESSION: Extensive intramuscular edema of the hips and thighs, right worse than left, consistent with nonspecific myositis. Subcutaneous edema medially and laterally of the left thigh. Interfascial fluid proximally and fluid collecting superficially along the surface of the vastus lateralis muscle, favored to be reactive.  No well-defined/drainable intramuscular collection to suggest mild necrosis or pyomyositis. Electronically Signed   By: Maurine Simmering M.D.   On: 06/28/2021 14:59   DG Chest Port 1 View  Result Date: 06/10/2021 CLINICAL DATA:  Fever, cardiac arrest at home. EXAM: PORTABLE CHEST 1 VIEW COMPARISON:  06/03/2021. FINDINGS: The heart size and mediastinal contours are within normal limits. Lung volumes are low. Strandy opacities are noted in the perihilar region on the right and left lung base, similar in appearance to the prior exam. No effusion or pneumothorax. A right internal jugular central venous catheter terminates over the superior vena cava. No acute osseous abnormality. IMPRESSION: Strandy opacities in the lungs bilaterally,  possible subsegmental atelectasis in not significantly changed from the prior exam. Electronically Signed   By: Brett Fairy M.D.   On: 06/10/2021 20:05   VAS [place] LOWER EXTREMITY VENOUS (DVT)  Result Date: 06/26/2021  Lower Venous DVT Study Patient Name:  DAMEIR LUCHINI [facility]  Date of Exam:   06/26/2021 Medical Rec #: [id number]          Accession #:    [id number] Date of Birth: February 18, 1976           Patient Gender: M Patient Age:   20 years Exam Location:  [facility] Procedure:      VAS [place] LOWER EXTREMITY VENOUS (DVT) Referring Phys: Erin Hearing --------------------------------------------------------------------------------  Indications: Unexplained thrombocytosis, and Pain.  Comparison Study: 06/02/21 prior Performing Technologist: Archie Patten RVS  Examination Guidelines: A complete evaluation includes B-mode imaging, spectral Doppler, color Doppler, and power Doppler as needed of all accessible portions of each vessel. Bilateral testing is considered an integral part of a complete examination. Limited examinations for reoccurring indications may be performed as noted. The reflux portion of the exam is performed with the patient in reverse Trendelenburg.   +---------+---------------+---------+-----------+----------+--------------+  RIGHT     Compressibility Phasicity Spontaneity Properties Thrombus Aging  +---------+---------------+---------+-----------+----------+--------------+  CFV       Full            Yes       Yes                                    +---------+---------------+---------+-----------+----------+--------------+  SFJ       Full                                                             +---------+---------------+---------+-----------+----------+--------------+  FV Prox   Full                                                             +---------+---------------+---------+-----------+----------+--------------+  FV Mid    Full                                                             +---------+---------------+---------+-----------+----------+--------------+  FV Distal Full                                                             +---------+---------------+---------+-----------+----------+--------------+  PFV       Full                                                             +---------+---------------+---------+-----------+----------+--------------+  POP       Full            Yes       Yes                                    +---------+---------------+---------+-----------+----------+--------------+  PTV       Full                                                             +---------+---------------+---------+-----------+----------+--------------+  PERO      Full                                                             +---------+---------------+---------+-----------+----------+--------------+   +---------+---------------+---------+-----------+----------+--------------+  LEFT      Compressibility Phasicity Spontaneity Properties Thrombus Aging  +---------+---------------+---------+-----------+----------+--------------+  CFV       Full            Yes       Yes                                     +---------+---------------+---------+-----------+----------+--------------+  SFJ       Full                                                             +---------+---------------+---------+-----------+----------+--------------+  FV Prox   Full                                                             +---------+---------------+---------+-----------+----------+--------------+  FV Mid    Full                                                             +---------+---------------+---------+-----------+----------+--------------+  FV Distal Full                                                             +---------+---------------+---------+-----------+----------+--------------+  PFV       Full                                                             +---------+---------------+---------+-----------+----------+--------------+  POP       Full            Yes       Yes                                    +---------+---------------+---------+-----------+----------+--------------+  PTV       Full                                                             +---------+---------------+---------+-----------+----------+--------------+  PERO      Full                                                             +---------+---------------+---------+-----------+----------+--------------+     Summary: BILATERAL: - No evidence of deep vein thrombosis seen in the lower extremities, bilaterally. -No evidence of popliteal cyst, bilaterally.   *See table(s) above for measurements and observations. Electronically signed by Orlie Pollen on 06/26/2021 at 6:28:20 PM.    Final     Microbiology: Results for orders placed or performed during the hospital encounter of 05/24/21  Resp Panel by RT-PCR (Flu A&B, Covid) Nasopharyngeal Swab     Status: None   Collection Time: 05/24/21  9:16 AM   Specimen: Nasopharyngeal Swab; Nasopharyngeal(NP) swabs in vial transport medium  Result Value Ref Range Status   SARS Coronavirus 2 by RT PCR NEGATIVE  NEGATIVE Final    Comment: (NOTE) SARS-CoV-2 target nucleic acids are NOT DETECTED.  The SARS-CoV-2 RNA is generally detectable in upper respiratory specimens during the acute phase of infection. The lowest concentration of SARS-CoV-2 viral copies this assay can detect is 138 copies/mL. A negative result does not preclude SARS-Cov-2 infection and should not be used as the sole basis for treatment or other patient management decisions. A negative result may occur with  improper specimen collection/handling, submission of specimen other than nasopharyngeal swab, presence of viral mutation(s) within the areas targeted by this assay, and inadequate number of viral copies(<138 copies/mL). A negative result must be combined with clinical observations, patient history, and epidemiological information. The expected result is Negative.  Fact Sheet for Patients:  EntrepreneurPulse.com.au  Fact Sheet for Healthcare Providers:  IncredibleEmployment.be  This test is no t yet approved or cleared by the [place] FDA and  has been authorized for detection and/or diagnosis of SARS-CoV-2 by FDA under an Emergency Use Authorization (EUA). This EUA will remain  in effect (meaning this test can be used) for the duration of the COVID-19 declaration under Section 564(b)(1) of the Act, 21 U.S.C.section 360bbb-3(b)(1), unless the authorization is terminated  or revoked sooner.       Influenza A by PCR NEGATIVE NEGATIVE Final   Influenza B by PCR NEGATIVE NEGATIVE Final    Comment: (NOTE) The Xpert Xpress SARS-CoV-2/FLU/RSV plus assay is intended as an aid in the diagnosis of influenza from Nasopharyngeal swab specimens and should not be used as a sole basis for treatment. Nasal washings and aspirates are unacceptable for Xpert Xpress SARS-CoV-2/FLU/RSV testing.  Fact Sheet  for Patients: EntrepreneurPulse.com.au  Fact Sheet for Healthcare  Providers: IncredibleEmployment.be  This test is not yet approved or cleared by the [place] FDA and has been authorized for detection and/or diagnosis of SARS-CoV-2 by FDA under an Emergency Use Authorization (EUA). This EUA will remain in effect (meaning this test can be used) for the duration of the COVID-19 declaration under Section 564(b)(1) of the Act, 21 U.S.C. section 360bbb-3(b)(1), unless the authorization is terminated or revoked.  Performed at [facility], [place] [address] [place], [place] [address]   MRSA Next Gen by PCR, Nasal     Status: None   Collection Time: 05/24/21  1:20 PM   Specimen: Nasal Mucosa; Nasal Swab  Result Value Ref Range Status   MRSA by PCR Next Gen NOT DETECTED NOT DETECTED Final    Comment: (NOTE) The GeneXpert MRSA Assay (FDA approved for NASAL specimens only), is one component of a comprehensive MRSA colonization surveillance program. It is not intended to diagnose MRSA infection nor to guide or monitor treatment for MRSA infections. Test performance is not FDA approved in patients less than 49 years old. Performed at [facility], [place] [address], [place], [place] [address]   Gastrointestinal Panel by PCR , Stool     Status: None   Collection Time: 06/01/21  5:27 AM   Specimen: Stool  Result Value Ref Range Status   Campylobacter species NOT DETECTED NOT DETECTED Final   Plesimonas shigelloides NOT DETECTED NOT DETECTED Final   Salmonella species NOT DETECTED NOT DETECTED Final   Yersinia enterocolitica NOT DETECTED NOT DETECTED Final   Vibrio species NOT DETECTED NOT DETECTED Final   Vibrio cholerae NOT DETECTED NOT DETECTED Final   Enteroaggregative E coli (EAEC) NOT DETECTED NOT DETECTED Final   Enteropathogenic E coli (EPEC) NOT DETECTED NOT DETECTED Final   Enterotoxigenic E coli (ETEC) NOT DETECTED NOT DETECTED Final   Shiga like toxin producing E coli (STEC) NOT DETECTED NOT DETECTED Final    Shigella/Enteroinvasive E coli (EIEC) NOT DETECTED NOT DETECTED Final   Cryptosporidium NOT DETECTED NOT DETECTED Final   Cyclospora cayetanensis NOT DETECTED NOT DETECTED Final   Entamoeba histolytica NOT DETECTED NOT DETECTED Final   Giardia lamblia NOT DETECTED NOT DETECTED Final   Adenovirus F40/41 NOT DETECTED NOT DETECTED Final   Astrovirus NOT DETECTED NOT DETECTED Final   Norovirus GI/GII NOT DETECTED NOT DETECTED Final   Rotavirus A NOT DETECTED NOT DETECTED Final   Sapovirus (I, II, IV, and V) NOT DETECTED NOT DETECTED Final    Comment: Performed at [facility], [place] [place], [place] [address]  Culture, blood (routine x 2)     Status: None   Collection Time: 06/07/21 10:14 AM   Specimen: BLOOD  Result Value Ref Range Status   Specimen Description BLOOD RIGHT ANTECUBITAL  Final   Special Requests   Final    BOTTLES DRAWN AEROBIC ONLY Blood Culture adequate volume   Culture   Final    NO GROWTH 5 DAYS Performed at [facility], [place] [address], [place], [place] [address]    Report Status 06/12/2021 FINAL  Final  Culture, blood (routine x 2)     Status: None   Collection Time: 06/07/21 10:17 AM   Specimen: BLOOD RIGHT HAND  Result Value Ref Range Status   Specimen Description BLOOD RIGHT HAND  Final   Special Requests   Final    BOTTLES DRAWN AEROBIC ONLY Blood Culture adequate  volume   Culture   Final    NO GROWTH 5 DAYS Performed at [facility], [place] [address], [place], [place] [address]    Report Status 06/12/2021 FINAL  Final  Gastrointestinal Panel by PCR , Stool     Status: None   Collection Time: 06/07/21  8:38 PM   Specimen: STOOL  Result Value Ref Range Status   Campylobacter species NOT DETECTED NOT DETECTED Final   Plesimonas shigelloides NOT DETECTED NOT DETECTED Final   Salmonella species NOT DETECTED NOT DETECTED Final   Yersinia enterocolitica NOT DETECTED NOT DETECTED Final   Vibrio species NOT DETECTED NOT DETECTED  Final   Vibrio cholerae NOT DETECTED NOT DETECTED Final   Enteroaggregative E coli (EAEC) NOT DETECTED NOT DETECTED Final   Enteropathogenic E coli (EPEC) NOT DETECTED NOT DETECTED Final   Enterotoxigenic E coli (ETEC) NOT DETECTED NOT DETECTED Final   Shiga like toxin producing E coli (STEC) NOT DETECTED NOT DETECTED Final   Shigella/Enteroinvasive E coli (EIEC) NOT DETECTED NOT DETECTED Final   Cryptosporidium NOT DETECTED NOT DETECTED Final   Cyclospora cayetanensis NOT DETECTED NOT DETECTED Final   Entamoeba histolytica NOT DETECTED NOT DETECTED Final   Giardia lamblia NOT DETECTED NOT DETECTED Final   Adenovirus F40/41 NOT DETECTED NOT DETECTED Final   Astrovirus NOT DETECTED NOT DETECTED Final   Norovirus GI/GII NOT DETECTED NOT DETECTED Final   Rotavirus A NOT DETECTED NOT DETECTED Final   Sapovirus (I, II, IV, and V) NOT DETECTED NOT DETECTED Final    Comment: Performed at [facility], [address] [place], [place] [address]  C Difficile Quick Screen (NO PCR Reflex)     Status: None   Collection Time: 06/07/21  8:38 PM   Specimen: STOOL  Result Value Ref Range Status   C Diff antigen NEGATIVE NEGATIVE Final   C Diff toxin NEGATIVE NEGATIVE Final   C Diff interpretation No C. difficile detected.  Final    Comment: Performed at [facility], [place] [address] [place], [place] [address]  Urine Culture     Status: Abnormal   Collection Time: 06/10/21  6:37 PM   Specimen: Urine, Clean Catch  Result Value Ref Range Status   Specimen Description URINE, CLEAN CATCH  Final   Special Requests   Final    NONE Performed at [facility], [place] [address] [place], [place] [address]    Culture 20,000 COLONIES/mL PSEUDOMONAS AERUGINOSA (A)  Final   Report Status 06/13/2021 FINAL  Final   Organism ID, Bacteria PSEUDOMONAS AERUGINOSA (A)  Final      Susceptibility   Pseudomonas aeruginosa - MIC*    CEFTAZIDIME 4 SENSITIVE Sensitive     CIPROFLOXACIN <=0.25  SENSITIVE Sensitive     GENTAMICIN <=1 SENSITIVE Sensitive     IMIPENEM 1 SENSITIVE Sensitive     PIP/TAZO 8 SENSITIVE Sensitive     CEFEPIME 2 SENSITIVE Sensitive     * 20,000 COLONIES/mL PSEUDOMONAS AERUGINOSA  Culture, blood (routine x 2)     Status: None   Collection Time: 06/10/21  9:02 PM   Specimen: BLOOD  Result Value Ref Range Status   Specimen Description BLOOD LEFT ANTECUBITAL  Final   Special Requests   Final    BOTTLES DRAWN AEROBIC AND ANAEROBIC Blood Culture adequate volume   Culture   Final    NO GROWTH 5 DAYS Performed at [facility], [place] [address], [place], [place] [address]  Report Status 06/15/2021 FINAL  Final  Culture, blood (routine x 2)     Status: None   Collection Time: 06/10/21  9:02 PM   Specimen: BLOOD  Result Value Ref Range Status   Specimen Description BLOOD RIGHT ANTECUBITAL  Final   Special Requests   Final    BOTTLES DRAWN AEROBIC AND ANAEROBIC Blood Culture results may not be optimal due to an inadequate volume of blood received in culture bottles   Culture   Final    NO GROWTH 5 DAYS Performed at [facility], [place] [address], [place], [place] [address]    Report Status 06/15/2021 FINAL  Final  Resp Panel by RT-PCR (Flu A&B, Covid) Nasopharyngeal Swab     Status: None   Collection Time: 06/14/21 10:16 AM   Specimen: Nasopharyngeal Swab; Nasopharyngeal(NP) swabs in vial transport medium  Result Value Ref Range Status   SARS Coronavirus 2 by RT PCR NEGATIVE NEGATIVE Final    Comment: (NOTE) SARS-CoV-2 target nucleic acids are NOT DETECTED.  The SARS-CoV-2 RNA is generally detectable in upper respiratory specimens during the acute phase of infection. The lowest concentration of SARS-CoV-2 viral copies this assay can detect is 138 copies/mL. A negative result does not preclude SARS-Cov-2 infection and should not be used as the sole basis for treatment or other patient management decisions. A negative result may occur with   improper specimen collection/handling, submission of specimen other than nasopharyngeal swab, presence of viral mutation(s) within the areas targeted by this assay, and inadequate number of viral copies(<138 copies/mL). A negative result must be combined with clinical observations, patient history, and epidemiological information. The expected result is Negative.  Fact Sheet for Patients:  EntrepreneurPulse.com.au  Fact Sheet for Healthcare Providers:  IncredibleEmployment.be  This test is no t yet approved or cleared by the [place] FDA and  has been authorized for detection and/or diagnosis of SARS-CoV-2 by FDA under an Emergency Use Authorization (EUA). This EUA will remain  in effect (meaning this test can be used) for the duration of the COVID-19 declaration under Section 564(b)(1) of the Act, 21 U.S.C.section 360bbb-3(b)(1), unless the authorization is terminated  or revoked sooner.       Influenza A by PCR NEGATIVE NEGATIVE Final   Influenza B by PCR NEGATIVE NEGATIVE Final    Comment: (NOTE) The Xpert Xpress SARS-CoV-2/FLU/RSV plus assay is intended as an aid in the diagnosis of influenza from Nasopharyngeal swab specimens and should not be used as a sole basis for treatment. Nasal washings and aspirates are unacceptable for Xpert Xpress SARS-CoV-2/FLU/RSV testing.  Fact Sheet for Patients: EntrepreneurPulse.com.au  Fact Sheet for Healthcare Providers: IncredibleEmployment.be  This test is not yet approved or cleared by the [place] FDA and has been authorized for detection and/or diagnosis of SARS-CoV-2 by FDA under an Emergency Use Authorization (EUA). This EUA will remain in effect (meaning this test can be used) for the duration of the COVID-19 declaration under Section 564(b)(1) of the Act, 21 U.S.C. section 360bbb-3(b)(1), unless the authorization is terminated  or revoked.  Performed at [facility], [place] [address] [place], [place] [address]     Labs: CBC: No results for input(s): WBC, NEUTROABS, HGB, HCT, MCV, PLT in the last 168 hours. Basic Metabolic Panel: Recent Labs  Lab 07/04/21 0255  NA 133*  K 3.3*  CL 99  CO2 20*  GLUCOSE 93  BUN 35*  CREATININE 0.88  CALCIUM 9.2   Liver Function Tests: Recent Labs  Lab 07/04/21 0255  AST 18  ALT 19  ALKPHOS 131*  BILITOT 0.3  PROT 7.2  ALBUMIN 3.1*   CBG: No results for input(s): GLUCAP in the last 168 hours.  Discharge time spent: greater than 30 minutes.  Signed: Erin Hearing, NP [facility] 07/07/2021   Attestation: Patient seen and evaluated, agree with assessment and plan as above.  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  Patient's prolonged hospital stay was complicated by his immigration status, lack of local support and insurance.  Patient continued physical therapy while inpatient now stable and agreeable for discharge with outpatient follow-up in the community in the next few weeks.  We will follow-up with [facility] for PCP on 07/15/2021, follow-up with neurology as scheduled.  Continue going wound care and physical therapy as directed above.  Holli Humbles DO

## 2021-07-06 NOTE — Progress Notes (Signed)
Progress Note   Patient: Andres Braun [id number] DOB: [birth year] DOA: 05/24/2021     43 DOS: the patient was seen and examined on 07/06/2021      Brief hospital course: 46 year old male admitted to ICU 1/15 under PCCM care after cardiac arrest at home.  PEA arrest, s/p CPR x20 minutes and epi x5.  Patient reportedly out drinking with his friends the night prior to admission.  Found by friends with agonal breathing.  Intubated on arrival, needed vasopressors, noted to have renal failure, rhabdomyolysis with CK in the 14 K range, treated for aspiration pneumonia, nephrology consulted and started on CRRT.  Extubated 1/19 and off pressors.  Mental status slowly improved.  Transferred to telemetry.  Care transferred to [facility] on 1/24.  Nephrology following for HD needs.  General surgery on board for questionable small bowel ischemia and taken emergently to surgery 1/24. The bowel in question was found to be thickened, but viable. Hospital course complicated by unexplained left lower extremity weakness. Extensive work up of the neuraxis is negative for compressive pathology. Differential is probably a psoas hematoma causing lumbar plexus compression/ irritation leading to secondary weakness. IR suggested that it is not amenable for aspiration or drain placement. Neurology consulted, suggested not a neurological injury as the neuraxis is negative.    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  Confirmed by MRI of both thighs.  CK is subsequently renormalized with IV fluids and utilization of pulse dose prednisone.  Redeveloped elevated CK with associated myositis  that responded to fluids and steroids. 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  Therapy will work with patient regarding home skills such as standing at counter to place food in the microwave oven.    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[place]) Bcx negative Cxr possible atelectasis See additional documentation under PVD and recent rhabdomyolysis ID has changed anbxs to PO doxy/augmentin for 16 days with first dose on 2/15. LD anbxs 07/10/21   Peripheral vascular disease of LEFT lower extremity w/ foot drop MRI of the neuraxis  was unremarkable for acute process . As per the MRI of the lumbar spine, multiple fluid collections in the right ileo psoas muscle.  Dr Benny Lennert spoke with neurology and neuro surgery on 1/24, no surgical cause for his lower extremity weakness. He suggested that a psoas abscess or hematoma could cause lumbar plexus compression /irritation leading to secondary weakness.   IR consulted for aspiration, suggested any aspiration would predispose to infection/ not amenable for aspiration or drain placement. Did not advise aspiration.  MRI of the femur c/w rhabdomyolysis, with the differential including myositis related to connective tissue disease and polymyositis. No drainable abscess identified. pt unable to move the left lower extremity from severe pain and weakness.  Orthopedics consulted, suggested its a neurological/ lumbar plexus ischemic injury.  Hoping for recovery with therapy evaluations.  Vitamin b12 and folate levels wnl. Arterial duplex w/ ABIs this admission as follows Left: Resting  left ankle-brachial index indicates  moderate left lower extremity arterial disease. The left toe-brachial index is abnormal.  Vascular evaluated on 2/15 and no acute vascular needs related to abnormal ABIs      Ischemic enteritis versus colitis with ileus-resolved as of 06/29/2021 General surgery consulted, diagnostic laparoscopy unremarkable  C. difficile PCR and GI panel negative    AKI (acute kidney injury)/dehydration secondary to persistent poor oral intake- (present on admission) with metabolic acidosis/rhabdomyolysis Acquired CRRT 1/15-1/19/2023. Nephrology had been following but renal function has recovered Redeveloped mild AKI and worsening thrombocytosis-evaluated by hematology AKI has resolved with hydration and correction of mild rhabdomyolosis Poor oral intake influencing hydration status.  IV fluids were at 150 an hour to treat elevated CK but CK has normalized therefore IV fluids decreased to 50 cc/h on 2/20   Aspiration pneumonia of both lower lobes due to vomit (HCC)-resolved as of 07/02/2021, (present on admission) Resolved Stable on room air  R anterior knee wound after placement IO access   2/14                              2/21        2/24  2/27 Continue wound care 3 times daily.  Patient will apply a nickel thick layer of Santyl over the wound cover with a moistened saline gauze then cover with dry gauze and secure with a foam dressing.      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  Dietitian working with patient regarding choices and available products in the hospital As of 2/22 albumin is 2.5  Septic shock due to undetermined organism (HCC)-resolved as of 06/29/2021, (present on admission) Sepsis physiology resolved  Cardiac arrest with pulseless electrical activity ([place]) S/p PEA arrest, s/p CPR x 20 minutes and Epi x 5. Echocardiogram 1/15 with preserved LV function Not on a statin  Elevated liver enzymes Mild transaminitis has context of reemergence of mild rhabdomyollosis Of note CT this admission shows no evidence of cirrhosis  Situational depression Patient experiencing depression prior to admission due to dissolution of his marriage Further worsening of depression symptoms based on current physical debility and lack of clarity on recovery Zoloft initiated on 2/15  Thrombocytosis Reactive secondary to low iron and acute critical illness anemia Was given IV iron by hematology team who has subsequently signed off  Hyponatremia-resolved as of 07/01/2021 Improving.  Likely 2/2 aki  Acute respiratory failure with hypoxia and hypercapnia (HCC)-resolved as of 07/01/2021 Patient intubated on 05/24/2021 and extubated on 05/28/2021 .  patient completed course of antibiotics for aspiration pneumonia  2/3 currently on room air        Subjective:  No mention of  pain and has significantly improved mobility over the past 72 hours.  Asking when it would be possible to discharge home.  Physical Exam: Vitals:   07/05/21 0958 07/05/21 1717 07/06/21 0449 07/06/21 1107  BP: (!) 121/91  120/88 (!) 144/68 (!) 124/93  Pulse: (!) 119 (!) 112 (!) 109   Resp: 18 18 18 17   Temp: 98.3 F (36.8 C) 98.2 F (36.8 C) 98.2 F (36.8 C) 98.1 F (36.7 C)  TempSrc:    Oral  SpO2: 99% 98%  100%  Weight:      Height:       General:  NAD, pleasant -much more excited today now that he is up mobilizing independently in the room with the wheelchair.  Working on transfers Lungs:  Clear to auscultate bilaterally -room air Heart:  Regular rate and rhythm.  Without murmurs, rubs, or gallops. Abdomen:  Soft, nontender, nondistended.  Normoactive bowel sounds LBM 2/26 Vascular: Weak DP/pedal pulses bilaterally. Neurological: Cranial nerves II through XII grossly intact, moves all extremities x4.  Upper extremity strength 4-5/5.  Strength and movement in legs significantly improved.  Patient now able to flex legs at the hip with little to no pain in the thigh and hip area although still pain primarily with the left leg.  Strength on right 3-4/5 in strength on left 2-3/5.  Movement primarily restricted by pain in the hip and thigh area.  Continues to report numbness both lower extremities that he states was not present prior to admission Skin: Right lower extremity anterior wound from previous IO placement  Data Reviewed:  All results have been reviewed and noted within the progress note documentation  Family Communication:  Patient only utilizing Spanish language translator  Disposition: Remains inpatient appropriate because:  Unsafe discharge plan Barriers to discharge: Patient will need to discharge to the home environment and based on current PT evaluation is not quite ready to discharge and maintain independence in a home setting.  He will not have 24/7 assistance once discharged.  Currently working on wheelchair transfers and other home-based activities.  His training has been initiated on 2/16   Planned Discharge Destination:  Barriers to discharge: Patient non-English-speaking and  does not have payer source for rehabilitative therapies such as SNF and currently is not able to independently mobilize   A physical therapy consult is indicated based on the patients mobility assessment.   Mobility Assessment (last 72 hours)     Mobility Assessment     Row Name 07/06/21 1100 07/05/21 2351 07/05/21 2251 07/05/21 0800 07/04/21 2030   Does patient have an order for bedrest or is patient medically unstable -- No - Continue assessment No - Continue assessment No - Continue assessment No - Continue assessment   What is the highest level of mobility based on the progressive mobility assessment? Level 5 (Walks with assist in room/hall) - Balance while stepping forward/back and can walk in room with assist - Complete Level 5 (Walks with assist in room/hall) - Balance while stepping forward/back and can walk in room with assist - Complete Level 5 (Walks with assist in room/hall) - Balance while stepping forward/back and can walk in room with assist - Complete Level 5 (Walks with assist in room/hall) - Balance while stepping forward/back and can walk in room with assist - Complete Level 5 (Walks with assist in room/hall) - Balance while stepping forward/back and can walk in room with assist - Complete   Is the above level different from baseline mobility prior to  current illness? -- No - Consider discontinuing PT/OT No - Consider discontinuing PT/OT Yes - Recommend PT order No - Consider discontinuing PT/OT    Row Name 07/04/21 0800 07/03/21 1953 07/03/21 1400       Does patient have an order for bedrest or is patient medically unstable No - Continue assessment No - Continue assessment --     What is the highest level of mobility based on the progressive mobility assessment? Level 5 (Walks with assist in room/hall) - Balance while stepping forward/back and can walk in room with assist - Complete Level 5 (Walks with assist in room/hall) - Balance while stepping forward/back and can walk in  room with assist - Complete Level 3 (Stands with assist) - Balance while standing  and cannot march in place     Is the above level different from baseline mobility prior to current illness? Yes - Recommend PT order Yes - Recommend PT order --             Medically stable Yes  COVID vaccination status:  Unknown  Consultants: Nephrology Neurology General surgery Orthopedic Infectious disease Procedures: Echocardiogram EEG Core track Temporary dialysis catheter Laparoscopic evaluation of abdomen in context of evaluation for ischemic bowel Antibiotics: Ceftriaxone 1/15 through 1/19 Ceftriaxone 1/21 through 1/23 Flagyl 1/21 through 1/23 Zosyn 1/24 through 1/26 Ancef 1/27 through 1/31 Vancomycin x1 dose on 2/1 Daptomycin 2/3 through 2/15 Cefepime 2/4 through 2/15 Doxycycline and Augmentin 2/15 with last doses due 3/3      Time spent: 25 minutes  Author: Erin Hearing, NP 07/06/2021 12:54 PM  For on call review [email or web address].

## 2021-07-06 NOTE — TOC Progression Note (Addendum)
Transition of Care [facility]) - Progression Note    Patient Details  Name: Edward Guthmiller MRN: [id number] Date of Birth: January 22, 1976  Transition [facility] CM/SW [place], RN Phone Number: 07/06/2021, 11:10 AM  Clinical Narrative:    CM met with the patient in the hospital room to discuss discharge planning for home - likely tomorrow, 07/07/2021.  Rob Bunting, [facility] interpreter was present in the room for discharge planning discussion.  The patient has available friends present at the home to assist with providing help with ADL's, transportation and home safety to include mobilization with the present wheelchair in the hospital room.  The patient states that his boss, [name] plans to obtain an ATV ramp from [employer] since they are unable to build a wooden wheelchair ramp for the patient.  PT/OT are presently working with the patient to teach wound care involving right knee.  Bedside nursing, Laverda Sorenson, RN is aware that patient will need dressing supplies to be sent home with the patient since the patient is without a payor source.   TOC Team was unable to establish charity home health and referral was sent to Outpatient therapy clinic for follow up.  The patient has an available wheelchair in the room and I called [employer] to provide wheelchair legs to the hospital room today and have the 3:1 shipped to the patient's home through [place].  DME will be provided through charity through [facility] team and hospital leadership is aware and LOG to be provided to [employer].  I spoke with Madilyn Fireman, MSW and the patient is set up for transportation to home tomorrow by [employer] [facility] transport at 1230 and patient has key access to the home and family will be available at the home at this time to assist with patient entering the home and care.  MATCH was placed so that [place] can provide discharge medications to the patient for home.  Erin Hearing, NP is aware and medications to be  delivered to the patient's hospital room prior to discharge home.  CM and MSW with DTP Team will continue to follow the patient for discharge needs to home by wheelchair transport van on 07/06/2021.   Expected Discharge Plan: OP Rehab Barriers to Discharge: No Barriers Identified (Patient to be discharged home on 2/28 after discharge teaching for woundcare right knee, dme equipment provided and famiily providing metal ramp from [employer] today 2/27)  Expected Discharge Plan and Services Expected Discharge Plan: OP Rehab In-house Referral: Clinical Social Work, PCP / Psychologist, educational, Interpreting Services Discharge Planning Services: CM Consult, [facility], [place], Medication Assistance, Follow-up appt scheduled Post Acute Care Choice: Durable Medical Equipment Living arrangements for the past 2 months: Single Family Home                 DME Arranged:  (patient has available wheelchair in hospital room - [employer] to provide wheelchair legs and 3:1 to be delivered to the home by [employer]) DME Agency: [employer] Date DME Agency Contacted: 07/06/21 Time DME Agency Contacted: [id number] Representative spoke with at DME Agency: Bethanne Ginger, [place] with [employer]             Social Determinants of Health ([place]) Interventions    Readmission Risk Interventions Readmission Risk Prevention Plan 06/25/2021  Transportation Screening Complete  PCP or Specialist Appt within 5-7 Days Complete  Home Care Screening Complete  Medication Review (RN CM) Complete

## 2021-07-06 NOTE — Plan of Care (Signed)
  Problem: Pain Managment: Goal: General experience of comfort will improve Outcome: Progressing   

## 2021-07-06 NOTE — Progress Notes (Signed)
Physical Therapy Wound Treatment Patient Details  Name: Andres Braun MRN: [id number] Date of Birth: Oct 14, 1975  Today's Date: 07/06/2021 Time: 1006-1050 Time Calculation (min): 44 min  Subjective  Subjective Assessment Subjective: Graciela, in person interpreter, present throughout session. Pt pleasant and agreeable to hydrotherapy. Patient and Family Stated Goals: None stated Date of Onset:  (Unknown) Prior Treatments: Dressing changes with Santyl  Pain Score:  Pt reports pain at end of session - RN notified pt asking for pain meds.   Wound Assessment  Wound / Incision (Open or Dehisced) 05/28/21 Puncture Knee Anterior;Right (Active)  Wound Image  Lateral aspect Medial aspect 07/06/21 1055  Dressing Type Foam - Lift dressing to assess site every shift;Gauze (Comment);Santyl;Barrier Film (skin prep);Moist to moist 07/06/21 1055  Dressing Changed Changed 07/06/21 1055  Dressing Status Clean, Dry, Intact 07/06/21 1055  Dressing Change Frequency Daily 07/06/21 1055  Site / Wound Assessment Pink;Yellow;Black;Brown 07/06/21 1055  % Wound base Red or Granulating 40% 07/06/21 1055  % Wound base Yellow/Fibrinous Exudate 50% 07/06/21 1055  % Wound base Black/Eschar 10% 07/06/21 1055  % Wound base Other/Granulation Tissue (Comment) 0% 07/06/21 1055  Peri-wound Assessment Intact 07/06/21 1055  Wound Length (cm) 10.2 cm 07/06/21 1000  Wound Width (cm) 11 cm 07/06/21 1000  Wound Depth (cm) 0.8 cm 07/06/21 1000  Wound Volume (cm^3) 89.76 cm^3 07/06/21 1000  Wound Surface Area (cm^2) 112.2 cm^2 07/06/21 1000  Tunneling (cm) 0 07/06/21 1000  Undermining (cm) 0 07/06/21 1000  Margins Unattached edges (unapproximated) 07/06/21 1055  Closure None 07/06/21 1055  Drainage Amount Minimal 07/06/21 1055  Drainage Description Serosanguineous 07/06/21 1055  Non-staged Wound Description Partial thickness 06/27/21 0800  Treatment Debridement (Selective);Hydrotherapy (Pulse lavage);Packing (Saline  gauze) 07/06/21 1055      Hydrotherapy Pulsed lavage therapy - wound location: R knee Pulsed Lavage with Suction (psi): 4 psi (for comfort) Pulsed Lavage with Suction - Normal Saline Used: 1000 mL Pulsed Lavage Tip: Tip with splash shield Selective Debridement Selective Debridement - Location: R knee Selective Debridement - Tools Used: Forceps, Scalpel Selective Debridement - Tissue Removed: Yellow necrotic tissue and brown unviable tissue    Wound Assessment and Plan  Wound Therapy - Assess/Plan/Recommendations Wound Therapy - Clinical Statement: Wound bed continues to improve. Focus on sharp debridement of yellow slough and necrotic edges. Pt was instructed in dressing changes to prepare for d/c home possibly tomorrow. This patient will benefit from continued hydrotherapy for selective removal of unviable tissue, to decrease bioburden, and promote wound bed healing. Wound Therapy - Functional Problem List: Immobility Factors Delaying/Impairing Wound Healing: Multiple medical problems, Other (comment) (Poor nutrition) Hydrotherapy Plan: Debridement, Dressing change, Patient/family education, Pulsatile lavage with suction Wound Therapy - Frequency: 6X / week Wound Therapy - Follow Up Recommendations: dressing changes by family/patient  Wound Therapy Goals- Improve the function of patient's integumentary system by progressing the wound(s) through the phases of wound healing (inflammation - proliferation - remodeling) by: Wound Therapy Goals - Improve the function of patient's integumentary system by progressing the wound(s) through the phases of wound healing by: Decrease Necrotic Tissue to: 20% Decrease Necrotic Tissue - Progress: Progressing toward goal Increase Granulation Tissue to: 80% Increase Granulation Tissue - Progress: Progressing toward goal Goals/treatment plan/discharge plan were made with and agreed upon by patient/family: Yes Time For Goal Achievement: 7 days Wound  Therapy - Potential for Goals: Good  Goals will be updated until maximal potential achieved or discharge criteria met.  Discharge criteria: when goals achieved, discharge from hospital,  MD decision/surgical intervention, no progress towards goals, refusal/missing three consecutive treatments without notification or medical reason.  GP     Charges PT Wound Care Charges $Wound Debridement up to 20 cm: < or equal to 20 cm $ Wound Debridement each add'l 20 sqcm: 3 $PT PLS Gun and Tip: 1 Supply $PT Hydrotherapy Visit: 1 Visit       [name] 07/06/2021, 11:05 AM  Rolinda Roan, PT, DPT Acute Rehabilitation Services Pager: [phone] Office: [phone]

## 2021-07-06 NOTE — Progress Notes (Signed)
Occupational Therapy Treatment Patient Details Name: Andres Braun MRN: [id number] DOB: 09/27/1975 Today's Date: 07/06/2021   History of present illness Pt is a 46 y.o. male admitted 05/24/21 after cardiac arrest at home; pt with agonal breathing, lost pulse, required 20-min CPR and 5 rounds epi. Workup for acute hypoxic respiratory failure, septic shock due to undertermined organism, AKI, acute metabolic encephalopathy, aspiration PNA of bilateral lower lobes due to vomit. ETT 1/15-1/19. CRRT 1/15-1/20. Brain MRI 1/22 negative. Cervical MRI 1/22 with small R-side disc protrusion C5-6. Abdominal CT showed muscular swelling, particularly iliopsoas and gluteal muscles consistent with rhabdomyolysis; hemorrhage noted within R iliopsoas. S/p diagnostic laparoscopy 1/24. MRI L femur 1/25 showed diffuse muscular abnormalities bilateral thighs with heterogenous T2 signal, especially within L glut max and gradratus femoris; findings compatible with dx of rhabdomyolysis, no drainable abscess identified; these findings could contribute to compartment syndrome. 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 Patient performed LB dressing at bed level due to safety when returning home to perform at this level. Patient used Secondary school teacher and supervision.  Patient performed UB dressing seated on EOB and donned socks in wheelchair with sock aide for LLE. Patient performed sit to stands and ambulated with RW with min assist +2 for safety.  Patient is making good gains and is eager to return home.  OT to address meal prep tasks next session. Acute OT to continue to follow.    Recommendations for follow up therapy are one component of a multi-disciplinary discharge planning process, led by the attending physician.   Recommendations may be updated based on patient status, additional functional criteria and insurance authorization.    Follow Up Recommendations  Skilled nursing-short term rehab (<3 hours/day)    Assistance Recommended at Discharge Frequent or constant Supervision/Assistance  Patient can return home with the following  Assistance with cooking/housework;Assist for transportation;Direct supervision/assist for financial management;Direct supervision/assist for medications management;Help with stairs or ramp for entrance;A little help with walking and/or transfers;A little help with bathing/dressing/bathroom   Equipment Recommendations  BSC/3in1;Wheelchair (measurements OT);Wheelchair cushion (measurements OT)    Recommendations for Other Services      Precautions / Restrictions Precautions Precautions: Fall;Other (comment) Precaution Comments: symptomatic orthostatic hypotension with standing Restrictions Weight Bearing Restrictions: No       Mobility Bed Mobility Overal bed mobility: Modified Independent             General bed mobility comments: Performed without bed rails to simulate home environment    Transfers Overall transfer level: Needs assistance Equipment used: Pushed w/c Transfers: Bed to chair/wheelchair/BSC Sit to Stand: Min assist, +2 physical assistance   Squat pivot transfers: Supervision Step pivot transfers: Min assist, +2 physical assistance     General transfer comment: supervision for transfer from EOB to wheelchair and back.  Performed sit to stands to RW with min assit +2     Balance Overall balance assessment: Needs assistance Sitting-balance support: No upper extremity supported, Feet supported Sitting balance-Leahy Scale: Fair     Standing balance support: Bilateral upper extremity supported Standing balance-Leahy Scale: Poor Standing balance comment: reliant on BUE support when standing                           ADL either  performed  or assessed with clinical judgement   ADL Overall ADL's : Needs assistance/impaired                 Upper Body Dressing : Supervision/safety;Sitting Upper Body Dressing Details (indicate cue type and reason): donned T-shirt sitting on EOB Lower Body Dressing: Supervision/safety;Bed level Lower Body Dressing Details (indicate cue type and reason): doffed paper scrubs and donned underwear and paper scrub in supine with reacher doffed and donned socks with sock aide for LLE               General ADL Comments: performed LB dressing for pants in supine due to patient will perform simular at home    Extremity/Trunk Assessment              Vision       Perception     Praxis      Cognition Arousal/Alertness: Awake/alert Behavior During Therapy: WFL for tasks assessed/performed Overall Cognitive Status: Impaired/Different from baseline Area of Impairment: Safety/judgement, Awareness                       Following Commands: Follows multi-step commands with increased time Safety/Judgement: Decreased awareness of safety Awareness: Anticipatory Problem Solving: Requires verbal cues, Requires tactile cues, Decreased initiation General Comments: eager to return home        Exercises      Shoulder Instructions       General Comments      Pertinent Vitals/ Pain       Pain Assessment Pain Assessment: Faces Faces Pain Scale: Hurts little more Pain Location: LLE > RLE with mobility Pain Descriptors / Indicators: Grimacing, Guarding, Numbness Pain Intervention(s): Limited activity within patient's tolerance, Monitored during session, Repositioned  Home Living                                          Prior Functioning/Environment              Frequency  Min 5X/week        Progress Toward Goals  OT Goals(current goals can now be found in the care plan section)  Progress towards OT goals: Progressing toward  goals  Acute Rehab OT Goals Patient Stated Goal: go home OT Goal Formulation: With patient Time For Goal Achievement: 07/17/21 Potential to Achieve Goals: Good ADL Goals Pt Will Perform Grooming: with modified independence;sitting Pt Will Perform Upper Body Bathing: with set-up;sitting Pt Will Perform Lower Body Bathing: with set-up;sitting/lateral leans Pt Will Transfer to Toilet: with min guard assist;squat pivot transfer;bedside commode Pt Will Perform Toileting - Clothing Manipulation and hygiene: with min guard assist;sitting/lateral leans Additional ADL Goal #1: pt will complete bed mobility supervision with no bed rails and HOB flat to simulate home surface Additional ADL Goal #2: pt will complete bed level bed pain mod I with flat bed surface Additional ADL Goal #3: pt will complete w/c transfer mod I  Plan Discharge plan remains appropriate    Co-evaluation    PT/OT/SLP Co-Evaluation/Treatment: Yes Reason for Co-Treatment: For patient/therapist safety   OT goals addressed during session: ADL's and self-care      AM-PAC OT "6 Clicks" Daily Activity     Outcome Measure   Help from another person eating meals?: None Help from another person taking care of personal grooming?: A Little Help from another person toileting, which includes using toliet, bedpan, or  urinal?: A Little Help from another person bathing (including washing, rinsing, drying)?: A Little Help from another person to put on and taking off regular upper body clothing?: A Little Help from another person to put on and taking off regular lower body clothing?: A Little 6 Click Score: 19    End of Session Equipment Utilized During Treatment: Gait belt;Rolling walker (2 wheels)  OT Visit Diagnosis: Unsteadiness on feet (R26.81);Other abnormalities of gait and mobility (R26.89);Muscle weakness (generalized) (M62.81);Other symptoms and signs involving cognitive function;Pain Pain - Right/Left: Left Pain - part  of body: Hip   Activity Tolerance Patient tolerated treatment well   Patient Left with call bell/phone within reach   Nurse Communication Mobility status        Time: [id number] OT Time Calculation (min): 42 min  Charges: OT General Charges $OT Visit: 1 Visit OT Treatments $Self Care/Home Management : 23-37 mins  Lodema Hong, [place]  Pager [phone] Office [place] 07/06/2021, 11:25 AM

## 2021-07-06 NOTE — Progress Notes (Signed)
Physical Therapy Treatment Patient Details Name: Andres Braun MRN: [id number] DOB: 11-03-1975 Today's Date: 07/06/2021   History of Present Illness Pt is a 46 y.o. male admitted 05/24/21 after cardiac arrest at home; pt with agonal breathing, lost pulse, required 20-min CPR and 5 rounds epi. Workup for acute hypoxic respiratory failure, septic shock due to undertermined organism, AKI, acute metabolic encephalopathy, aspiration PNA of bilateral lower lobes due to vomit. ETT 1/15-1/19. CRRT 1/15-1/20. Brain MRI 1/22 negative. Cervical MRI 1/22 with small R-side disc protrusion C5-6. Abdominal CT showed muscular swelling, particularly iliopsoas and gluteal muscles consistent with rhabdomyolysis; hemorrhage noted within R iliopsoas. S/p diagnostic laparoscopy 1/24. MRI L femur 1/25 showed diffuse muscular abnormalities bilateral thighs with heterogenous T2 signal, especially within L glut max and gradratus femoris; findings compatible with dx of rhabdomyolysis, no drainable abscess identified; these findings could contribute to compartment syndrome. 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 Pt was able to ambulate with RW with min assist of 2 initially but had episode of knee buckling needing +2 mod assist to recover. Pt is Modif I with wheelchair mobility and transfers however. Discussed with pt need to only walk at home with someone and use wheelchair when he is alone during day.  Pt is much improved today and can do most things from wheelchair level on his own. Pt currently with functional limitations due to balance and endurance deficits. Pt will benefit from skilled PT to increase their independence and safety with mobility to allow discharge to the venue listed below.      Recommendations for follow up therapy  are one component of a multi-disciplinary discharge planning process, led by the attending physician.  Recommendations may be updated based on patient status, additional functional criteria and insurance authorization.  Follow Up Recommendations  Skilled nursing-short term rehab (<3 hours/day)     Assistance Recommended at Discharge Frequent or constant Supervision/Assistance  Patient can return home with the following A lot of help with walking and/or transfers;Assistance with cooking/housework;Direct supervision/assist for medications management;Direct supervision/assist for financial management;Assist for transportation;Help with stairs or ramp for entrance;A lot of help with bathing/dressing/bathroom   Equipment Recommendations  Wheelchair (measurements PT);Wheelchair cushion (measurements PT);BSC/3in1    Recommendations for Other Services       Precautions / Restrictions Precautions Precautions: Fall;Other (comment) Precaution Comments: symptomatic orthostatic hypotension with standing Required Braces or Orthoses: Other Brace Other Brace: L AFO Restrictions Weight Bearing Restrictions: No     Mobility  Bed Mobility Overal bed mobility: Modified Independent             General bed mobility comments: Pt put underwear and scrub pants on while he was lying down on his own without assist. Pt placed shirt on once he sat up. Performed without bed rails and with bed flat  to simulate home environment    Transfers Overall transfer level: Needs assistance   Transfers: Bed to chair/wheelchair/BSC Sit to Stand: Min assist, +2 physical assistance     Squat pivot transfers: Supervision    Lateral/Scoot Transfers: Supervision General transfer comment: supervision for transfer from EOB to wheelchair and back.  Performed sit to stands to RW with min assit +2    Ambulation/Gait Ambulation/Gait assistance: +2 physical assistance, Mod assist Gait Distance (Feet): 50  Feet Assistive device:  Rolling walker (2 wheels) Gait Pattern/deviations: Step-to pattern, Decreased step length - right, Decreased step length - left, Decreased weight shift to left, Knees buckling, Antalgic, Narrow base of support Gait velocity: slowed Gait velocity interpretation: <1.31 ft/sec, indicative of household ambulator   General Gait Details: Took pt outside to ambulate. Pt was able to progress ambulation with use of RW. Pt intiially not placing much weight on his left LE and held it off floor.  Pt was encourged to place the left foot down and weight bear as tolerated. Pt was able to place some weight on it then and continued walking.  After about 50 feet, pt did have episode with left knee buckling with PT and OT providing mod assist as pt could not catch his own balance.  Pt then wanted to rest and declined a further walk.   Stairs             Information systems manager mobility: Yes Wheelchair propulsion: Both upper extremities Wheelchair parts: Supervision/cueing Distance: Immunologist Details (indicate cue type and reason): pt using wheelchair that will be his at discharge and unfortunately it does not have leg rests. Therapy fashioned a leg rest out of a gait belt as pt does not have enought LE strength to lift feet to keep from dragging on ground. Pt requires no assist for placing LE in foot strap. Pt able to demonstrate locking brakes and moving the armrests without cues. Practiced navigation over uneven surfaces, and in tight spaces  Modified Rankin (Stroke Patients Only) Modified Rankin (Stroke Patients Only) Pre-Morbid Rankin Score: No symptoms Modified Rankin: Severe disability     Balance Overall balance assessment: Needs assistance Sitting-balance support: No upper extremity supported, Feet supported Sitting balance-Leahy Scale: Good Sitting balance - Comments: Pt able to put his socks on while sitting in wheelchair.  Placed right sock on without sock aid but needed the sock aid for the left foot.   Standing balance support: Bilateral upper extremity supported Standing balance-Leahy Scale: Poor Standing balance comment: reliant on BUE support when standing and min guard assist for static stance. Dynamically needs up to mod +2 assist                            Cognition Arousal/Alertness: Awake/alert Behavior During Therapy: [facility] for tasks assessed/performed Overall Cognitive Status: Impaired/Different from baseline Area of Impairment: Safety/judgement, Awareness                       Following Commands: Follows multi-step commands with increased time Safety/Judgement: Decreased awareness of safety Awareness: Anticipatory Problem Solving: Requires verbal cues, Requires tactile cues, Decreased initiation General Comments: eager to return home        Exercises General Exercises - Lower Extremity Ankle Circles/Pumps: AROM, Right, 5 reps Long Arc Quad: Left, 10 reps, Seated, PROM Heel Slides: Right, AROM, 5 reps Hip Flexion/Marching: AAROM, Left, 10 reps, Seated    General Comments General comments (skin integrity, edema, etc.): Graciella translated session.      Pertinent Vitals/Pain Pain Assessment Pain Assessment: Faces Faces Pain Scale: Hurts even more Pain Location: LLE > RLE with mobility Pain Descriptors / Indicators: Grimacing, Guarding, Numbness Pain Intervention(s): Limited activity within patient's tolerance, Monitored during session, Repositioned    Home Living                          Prior Function  PT Goals (current goals can now be found in the care plan section) Acute Rehab PT Goals Patient Stated Goal: to do more Progress towards PT goals: Progressing toward goals    Frequency    Min 5X/week (STAR)      PT Plan Current plan remains appropriate    Co-evaluation PT/OT/SLP Co-Evaluation/Treatment: Yes Reason for  Co-Treatment: Complexity of the patient's impairments (multi-system involvement);For patient/therapist safety PT goals addressed during session: Mobility/safety with mobility OT goals addressed during session: ADL's and self-care      AM-PAC PT "6 Clicks" Mobility   Outcome Measure  Help needed turning from your back to your side while in a flat bed without using bedrails?: None Help needed moving from lying on your back to sitting on the side of a flat bed without using bedrails?: None Help needed moving to and from a bed to a chair (including a wheelchair)?: A Little Help needed standing up from a chair using your arms (e.g., wheelchair or bedside chair)?: Total Help needed to walk in hospital room?: Total Help needed climbing 3-5 steps with a railing? : Total 6 Click Score: 14    End of Session Equipment Utilized During Treatment: Gait belt Activity Tolerance: Patient tolerated treatment well;Patient limited by pain Patient left: in bed;with call bell/phone within reach;Other (comment) (interpreter present) Nurse Communication: Mobility status PT Visit Diagnosis: Muscle weakness (generalized) (M62.81);Difficulty in walking, not elsewhere classified (R26.2);Unsteadiness on feet (R26.81)     Time: [id number] PT Time Calculation (min) (ACUTE ONLY): 46 min  Charges:  $Gait Training: 8-22 mins                     Tailyn Hantz M,PT Acute Rehab Services [phone] (pager)    Alvira Philips 07/06/2021, 1:29 PM

## 2021-07-07 ENCOUNTER — Other Ambulatory Visit (HOSPITAL_COMMUNITY): Payer: Self-pay

## 2021-07-07 NOTE — Progress Notes (Signed)
Physical Therapy Wound Treatment Patient Details  Name: Andres Braun MRN: [id number] Date of Birth: 11/08/1975  Today's Date: 07/07/2021 Time: 1012-1043 Time Calculation (min): 31 min  Subjective  Subjective Assessment Subjective: Andres Braun, in person interpreter, present throughout session. Pt pleasant and agreeable to hydrotherapy. Patient and Family Stated Goals: None stated Date of Onset:  (Unknown) Prior Treatments: Dressing changes with Santyl  Pain Score:  Pt with facial grimacing during pulsed lavage, but no complaints of pain throughout treatment.  Wound Assessment  Wound / Incision (Open or Dehisced) 05/28/21 Puncture Knee Anterior;Right (Active)  Dressing Type Gauze (Comment) 07/07/21 1344  Dressing Changed Changed 07/07/21 1344  Dressing Status Clean, Dry, Intact 07/07/21 1344  Dressing Change Frequency Daily 07/07/21 1344  Site / Wound Assessment Pink;Yellow;Black;Brown 07/07/21 1344  % Wound base Red or Granulating 40% 07/07/21 1344  % Wound base Yellow/Fibrinous Exudate 10% 07/07/21 1344  % Wound base Black/Eschar 10% 07/07/21 1344  % Wound base Other/Granulation Tissue (Comment) 40% 07/07/21 1344  Peri-wound Assessment Intact 07/07/21 1344  Wound Length (cm) 10.2 cm 07/06/21 1000  Wound Width (cm) 11 cm 07/06/21 1000  Wound Depth (cm) 0.8 cm 07/06/21 1000  Wound Volume (cm^3) 89.76 cm^3 07/06/21 1000  Wound Surface Area (cm^2) 112.2 cm^2 07/06/21 1000  Tunneling (cm) 0 07/06/21 1000  Undermining (cm) 0 07/06/21 1000  Margins Unattached edges (unapproximated) 07/07/21 1344  Closure None 07/07/21 1344  Drainage Amount Minimal 07/07/21 1344  Drainage Description Serosanguineous 07/07/21 1344  Non-staged Wound Description Partial thickness 06/27/21 0800  Treatment Debridement (Selective);Hydrotherapy (Pulse lavage);Packing (Saline gauze) 07/07/21 1344      Hydrotherapy Pulsed lavage therapy - wound location: R knee Pulsed Lavage with Suction (psi): 4 psi  (for comfort) Pulsed Lavage with Suction - Normal Saline Used: 1000 mL Pulsed Lavage Tip: Tip with splash shield Selective Debridement Selective Debridement - Location: R knee Selective Debridement - Tools Used: Forceps, Scalpel Selective Debridement - Tissue Removed: Yellow necrotic tissue and brown unviable tissue    Wound Assessment and Plan  Wound Therapy - Assess/Plan/Recommendations Wound Therapy - Clinical Statement: Wound bed continues to improve. Today, noted a majority of the yellow tissue now appears white, and appears to be fascia. Focus on sharp debridement of necrotic edges. Reinforced education on dressing changes at home and pt performed with therapist assist. He anticipates d/c home today. Id discharge is delayed, hydrotherapy will conitnue to see as needed. Wound Therapy - Functional Problem List: Immobility Factors Delaying/Impairing Wound Healing: Multiple medical problems, Other (comment) (Poor nutrition) Hydrotherapy Plan: Debridement, Dressing change, Patient/family education, Pulsatile lavage with suction Wound Therapy - Frequency: 6X / week Wound Therapy - Follow Up Recommendations: dressing changes by family/patient  Wound Therapy Goals- Improve the function of patient's integumentary system by progressing the wound(s) through the phases of wound healing (inflammation - proliferation - remodeling) by: Wound Therapy Goals - Improve the function of patient's integumentary system by progressing the wound(s) through the phases of wound healing by: Decrease Necrotic Tissue to: 20% Decrease Necrotic Tissue - Progress: Progressing toward goal Increase Granulation Tissue to: 80% Increase Granulation Tissue - Progress: Progressing toward goal Goals/treatment plan/discharge plan were made with and agreed upon by patient/family: Yes Time For Goal Achievement: 7 days Wound Therapy - Potential for Goals: Good  Goals will be updated until maximal potential achieved or  discharge criteria met.  Discharge criteria: when goals achieved, discharge from hospital, MD decision/surgical intervention, no progress towards goals, refusal/missing three consecutive treatments without notification or medical reason.  GP     Charges PT Wound Care Charges $Wound Debridement up to 20 cm: < or equal to 20 cm $PT Hydrotherapy Dressing: 1 dressing $PT PLS Gun and Tip: 1 Supply $PT Hydrotherapy Visit: 1 Visit       [name] 07/07/2021, 1:50 PM  Andres Braun, PT, DPT Acute Rehabilitation Services Pager: [phone] Office: [phone]

## 2021-07-07 NOTE — Progress Notes (Signed)
Physical Therapy Treatment Patient Details Name: Andres Braun MRN: [id number] DOB: 02/03/1976 Today's Date: 07/07/2021   History of Present Illness Pt is a 46 y.o. male admitted 05/24/21 after cardiac arrest at home; pt with agonal breathing, lost pulse, required 20-min CPR and 5 rounds epi. Workup for acute hypoxic respiratory failure, septic shock due to undertermined organism, AKI, acute metabolic encephalopathy, aspiration PNA of bilateral lower lobes due to vomit. ETT 1/15-1/19. CRRT 1/15-1/20. Brain MRI 1/22 negative. Cervical MRI 1/22 with small R-side disc protrusion C5-6. Abdominal CT showed muscular swelling, particularly iliopsoas and gluteal muscles consistent with rhabdomyolysis; hemorrhage noted within R iliopsoas. S/p diagnostic laparoscopy 1/24. MRI L femur 1/25 showed diffuse muscular abnormalities bilateral thighs with heterogenous T2 signal, especially within L glut max and gradratus femoris; findings compatible with dx of rhabdomyolysis, no drainable abscess identified; these findings could contribute to compartment syndrome. 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 Pt was able to transfer with Modif I to and from wheelchair from different surfaces. Pt also is able to manage wheelchair parts without assist. Obtained shoes for pt and called [employer] for AFO for left LE as well and pt will have these to continue therapy at home.  Plan is for pt to d/c today.   Pt currently with functional limitations due to endurance and strength deficits. Pt will benefit from skilled PT to increase their independence and safety with mobility to allow discharge to the venue listed below.      Recommendations for follow up therapy are one component of a multi-disciplinary discharge planning process, led by the  attending physician.  Recommendations may be updated based on patient status, additional functional criteria and insurance authorization.  Follow Up Recommendations  Skilled nursing-short term rehab (<3 hours/day), HHPT if pt does go home with boss     Assistance Recommended at Discharge Frequent or constant Supervision/Assistance  Patient can return home with the following A lot of help with walking and/or transfers;Assistance with cooking/housework;Direct supervision/assist for medications management;Direct supervision/assist for financial management;Assist for transportation;Help with stairs or ramp for entrance;A lot of help with bathing/dressing/bathroom   Equipment Recommendations  Wheelchair (measurements PT);Wheelchair cushion (measurements PT);BSC/3in1    Recommendations for Other Services       Precautions / Restrictions Precautions Precautions: Fall;Other (comment) Precaution Comments: symptomatic orthostatic hypotension with standing Required Braces or Orthoses: Other Brace Other Brace: L AFO Restrictions Weight Bearing Restrictions: No     Mobility  Bed Mobility Overal bed mobility: Modified Independent Bed Mobility: Supine to Sit, Sit to Supine Rolling: Independent Sidelying to sit: Independent Supine to sit: Independent     General bed mobility comments: Pt does not need assist with bed mobility    Transfers Overall transfer level: Needs assistance Equipment used: Pushed w/c         Squat pivot transfers: Modified independent (Device/Increase time)     General transfer comment: Modif I  for transfer from EOB to wheelchair and back.  Pt able to manage wheelchair parts. Pt was ble to don socks and shoes today with help to tie left shoe only. Used sock aid with left shoe as well. Called [employer] to bring left AFO for pt as well and Andres Braun from [facility] brought it prior to session completion.  Pt also was able to transfer  to and from full size bed on Rehab with  Modif I.    Ambulation/Gait                   Theme park manager propulsion: Both upper extremities Wheelchair parts: Independent Distance: 5000+ Wheelchair Assistance Details (indicate cue type and reason): pt using wheelchair that will be his at discharge and CM got leg rests. Therapy fashioned a leg rest out of a gait belt as pt does not have enough LE strength to lift feet to keep from dragging on ground. Pt requires no assist for placing LE in foot strap. Pt able to demonstrate locking brakes and moving the armrests without cues. Practiced navigation over uneven surfaces, and in tight spaces.  Also took pt to REhab and practiced kitchen management and pt was ableto manage in kitchen to get items.  Modified Rankin (Stroke Patients Only) Modified Rankin (Stroke Patients Only) Pre-Morbid Rankin Score: No symptoms Modified Rankin: Severe disability     Balance Overall balance assessment: Needs assistance Sitting-balance support: No upper extremity supported, Feet supported Sitting balance-Leahy Scale: Good Sitting balance - Comments: Pt able to put his socks on while sitting in wheelchair. Placed right sock on without sock aid but needed the sock aid for the left foot.   Standing balance support: Bilateral upper extremity supported Standing balance-Leahy Scale: Poor Standing balance comment: reliant on BUE support when standing and min guard assist for static stance. Dynamically needs up to mod +2 assist                            Cognition Arousal/Alertness: Awake/alert Behavior During Therapy: WFL for tasks assessed/performed Overall Cognitive Status: Impaired/Different from baseline Area of Impairment: Safety/judgement, Awareness                 Orientation Level: Disoriented to, Time Current Attention Level: Sustained, Selective Memory: Decreased recall of precautions Following Commands:  Follows multi-step commands with increased time Safety/Judgement: Decreased awareness of safety Awareness: Anticipatory Problem Solving: Requires verbal cues, Requires tactile cues, Decreased initiation General Comments: eager to return home        Exercises      General Comments General comments (skin integrity, edema, etc.): Discussed car transfer with pt.      Pertinent Vitals/Pain Pain Assessment Pain Assessment: Faces Faces Pain Scale: Hurts even more Pain Location: LLE > RLE with mobility Pain Descriptors / Indicators: Grimacing, Guarding, Numbness Pain Intervention(s): Limited activity within patient's tolerance, Monitored during session, Repositioned    Home Living                          Prior Function            PT Goals (current goals can now be found in the care plan section) Acute Rehab PT Goals Patient Stated Goal: to do more Progress towards PT goals: Progressing toward goals    Frequency    Min 5X/week (STAR)      PT Plan Current plan remains appropriate    Co-evaluation PT/OT/SLP Co-Evaluation/Treatment: Yes Reason for Co-Treatment: Complexity of the patient's impairments (multi-system involvement);For patient/therapist safety PT goals addressed during session: Mobility/safety with mobility        AM-PAC PT "6 Clicks" Mobility   Outcome Measure  Help needed turning from your back to your side while in a  flat bed without using bedrails?: None Help needed moving from lying on your back to sitting on the side of a flat bed without using bedrails?: None Help needed moving to and from a bed to a chair (including a wheelchair)?: None Help needed standing up from a chair using your arms (e.g., wheelchair or bedside chair)?: Total Help needed to walk in hospital room?: Total Help needed climbing 3-5 steps with a railing? : Total 6 Click Score: 15    End of Session Equipment Utilized During Treatment: Gait belt Activity Tolerance:  Patient tolerated treatment well;Patient limited by pain Patient left: in bed;with call bell/phone within reach;Other (comment) (interpreter present) Nurse Communication: Mobility status PT Visit Diagnosis: Muscle weakness (generalized) (M62.81);Difficulty in walking, not elsewhere classified (R26.2);Unsteadiness on feet (R26.81)     Time: [id number] PT Time Calculation (min) (ACUTE ONLY): 50 min  Charges:  $Therapeutic Activity: 23-37 mins                     Andres Braun M,PT Acute Rehab Services [phone] (pager)    Andres Braun 07/07/2021, 11:51 AM

## 2021-07-07 NOTE — Progress Notes (Signed)
Occupational Therapy Treatment Patient Details Name: Andres Braun MRN: [id number] DOB: 09/15/1975 Today's Date: 07/07/2021   History of present illness Pt is a 46 y.o. male admitted 05/24/21 after cardiac arrest at home; pt with agonal breathing, lost pulse, required 20-min CPR and 5 rounds epi. Workup for acute hypoxic respiratory failure, septic shock due to undertermined organism, AKI, acute metabolic encephalopathy, aspiration PNA of bilateral lower lobes due to vomit. ETT 1/15-1/19. CRRT 1/15-1/20. Brain MRI 1/22 negative. Cervical MRI 1/22 with small R-side disc protrusion C5-6. Abdominal CT showed muscular swelling, particularly iliopsoas and gluteal muscles consistent with rhabdomyolysis; hemorrhage noted within R iliopsoas. S/p diagnostic laparoscopy 1/24. MRI L femur 1/25 showed diffuse muscular abnormalities bilateral thighs with heterogenous T2 signal, especially within L glut max and gradratus femoris; findings compatible with dx of rhabdomyolysis, no drainable abscess identified; these findings could contribute to compartment syndrome. 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 Patient able to perform LB dressing with changing socks with sock aide for LLE and assistance with tying shoes. IADLs address with simulated meal prep with removing item from refrigerator and placing in microwave. Patient also perform bed transfer to/from regular bed. Patient has made good progress and is expected to discharge home today.     Recommendations for follow up therapy are one component of a multi-disciplinary discharge planning process, led by the attending physician.  Recommendations may be  updated based on patient status, additional functional criteria and insurance authorization.    Follow Up Recommendations  Skilled nursing-short term rehab (<3 hours/day)    Assistance Recommended at Discharge Frequent or constant Supervision/Assistance  Patient can return home with the following  Assistance with cooking/housework;Assist for transportation;Direct supervision/assist for financial management;Direct supervision/assist for medications management;Help with stairs or ramp for entrance;A little help with walking and/or transfers;A little help with bathing/dressing/bathroom   Equipment Recommendations  BSC/3in1;Wheelchair (measurements OT);Wheelchair cushion (measurements OT)    Recommendations for Other Services      Precautions / Restrictions Precautions Precautions: Fall;Other (comment) Precaution Comments: symptomatic orthostatic hypotension with standing Required Braces or Orthoses: Other Brace Other Brace: L AFO Restrictions Weight Bearing Restrictions: No       Mobility Bed Mobility Overal bed mobility: Modified Independent             General bed mobility comments: Pt does not need assist with bed mobility    Transfers Overall transfer level: Needs assistance Equipment used: Pushed w/c       Squat pivot transfers: Modified independent (Device/Increase time)       General transfer comment: Patient performred transfers from wheelchair to regular full size bed and back with modified independence     Balance Overall balance assessment: Needs assistance Sitting-balance support: No upper extremity supported, Feet supported Sitting balance-Leahy Scale: Good Sitting balance - Comments: Pt able to put his socks on while sitting in wheelchair. Placed right sock on without sock aid but needed the sock aid for the left foot.   Standing balance support: Bilateral upper extremity supported Standing balance-Leahy Scale: Poor Standing balance comment: reliant  on BUE support when standing and min guard assist for static stance. Dynamically needs up to mod +2 assist  ADL either performed or assessed with clinical judgement   ADL Overall ADL's : Needs assistance/impaired                     Lower Body Dressing: Supervision/safety;Sitting/lateral leans Lower Body Dressing Details (indicate cue type and reason): Patient doffed socks and donned socks with sock aide for left foot and donned shoes with mod assist for left shoe. Toilet Transfer: Careers information officer Details (indicate cue type and reason): Patient taking himself to bathroom upon entry and peformed toilet transfer with squat pivot and distant supervision Toileting- Clothing Manipulation and Hygiene: Supervision/safety Toileting - Clothing Manipulation Details (indicate cue type and reason): lateral leaning to perform     Functional mobility during ADLs: Modified independent;Wheelchair General ADL Comments: IADLs performed with getting item from refrigerator and placing in microwave from wheelchair level in OT kitchen    Extremity/Trunk Assessment              Vision       Perception     Praxis      Cognition Arousal/Alertness: Awake/alert Behavior During Therapy: WFL for tasks assessed/performed Overall Cognitive Status: Impaired/Different from baseline Area of Impairment: Safety/judgement, Awareness                 Orientation Level: Disoriented to, Time Current Attention Level: Sustained, Selective Memory: Decreased recall of precautions Following Commands: Follows multi-step commands with increased time Safety/Judgement: Decreased awareness of safety Awareness: Anticipatory Problem Solving: Requires verbal cues, Requires tactile cues, Decreased initiation General Comments: eager to return home        Exercises      Shoulder Instructions       General Comments performed  simulated meal prep in OT kitchen    Pertinent Vitals/ Pain       Pain Assessment Pain Assessment: Faces Faces Pain Scale: Hurts even more Pain Location: LLE > RLE with mobility Pain Descriptors / Indicators: Grimacing, Guarding, Numbness Pain Intervention(s): Limited activity within patient's tolerance, Monitored during session, Repositioned  Home Living                                          Prior Functioning/Environment              Frequency  Min 5X/week        Progress Toward Goals  OT Goals(current goals can now be found in the care plan section)  Progress towards OT goals: Progressing toward goals  Acute Rehab OT Goals Patient Stated Goal: go home OT Goal Formulation: With patient Time For Goal Achievement: 07/17/21 Potential to Achieve Goals: Good ADL Goals Pt Will Perform Grooming: with modified independence;sitting Pt Will Perform Upper Body Bathing: with set-up;sitting Pt Will Perform Lower Body Bathing: with set-up;sitting/lateral leans Pt Will Transfer to Toilet: with min guard assist;squat pivot transfer;bedside commode Pt Will Perform Toileting - Clothing Manipulation and hygiene: with min guard assist;sitting/lateral leans Additional ADL Goal #1: pt will complete bed mobility supervision with no bed rails and HOB flat to simulate home surface Additional ADL Goal #2: pt will complete bed level bed pain mod I with flat bed surface Additional ADL Goal #3: pt will complete w/c transfer mod I  Plan Discharge plan remains appropriate    Co-evaluation    PT/OT/SLP Co-Evaluation/Treatment: Yes Reason for Co-Treatment: Complexity of the patient's impairments (multi-system involvement);For patient/therapist safety PT goals addressed during session:  Mobility/safety with mobility OT goals addressed during session: ADL's and self-care      AM-PAC OT "6 Clicks" Daily Activity     Outcome Measure   Help from another person eating  meals?: None Help from another person taking care of personal grooming?: A Little Help from another person toileting, which includes using toliet, bedpan, or urinal?: A Little Help from another person bathing (including washing, rinsing, drying)?: A Little Help from another person to put on and taking off regular upper body clothing?: A Little Help from another person to put on and taking off regular lower body clothing?: A Little 6 Click Score: 19    End of Session    OT Visit Diagnosis: Unsteadiness on feet (R26.81);Other abnormalities of gait and mobility (R26.89);Muscle weakness (generalized) (M62.81);Other symptoms and signs involving cognitive function;Pain Pain - Right/Left: Left Pain - part of body: Hip   Activity Tolerance     Patient Left     Nurse Communication          Time: [id number] OT Time Calculation (min): 50 min  Charges: OT General Charges $OT Visit: 1 Visit OT Treatments $Self Care/Home Management : 8-22 mins  Lodema Hong, [place]  Pager [phone] Office [place] 07/07/2021, 12:41 PM

## 2021-07-07 NOTE — TOC Transition Note (Signed)
Transition of Care [facility]) - CM/SW Discharge Note   Patient Details  Name: Andres Braun MRN: [id number] Date of Birth: 10/11/1975  [facility] CM/SW Contact:  Curlene Labrum, RN Phone Number: 07/07/2021, 10:10 AM   Clinical Narrative:    CM met with the patient at the bedside with use of the spanish interpreter I-pad to discuss discharge to home today with friends to assist.  Patient has been scheduled for transportation to home via [facility] with use of own wheelchair.  The patient's boss was handling purchase of wheelchair ramp from [facility] and the boss's son will be available at the home when the patient arrives by [name] to the home.  The patient has available wheelchair in the hospital room to take home and 3:1 to be delivered to the home by drop- ship.  The patient received therapy at the hospital on the [place] home health was unable to offer services to the patient but patient will have friends available to assist and the patient is was taught wound care to right knee and will be sent home with dressing supplies.  TOC pharmacy delivered discharge medications to the patient this morning.  CM and MSW with DTP Team will continue to follow the patient for discharge to home today by wheelchair [name] by [facility] P[employer].    Final next level of care: Home/Self Care Barriers to Discharge: No Barriers Identified   Patient Goals and CMS Choice Patient states their goals for this hospitalization and ongoing recovery are:: Patient is agreeable to go home with support from patient's boss, [name] - [phone] CMS Medicare.gov Compare Post Acute Care list provided to:: Patient Choice offered to / list presented to : Patient  Discharge Placement                       Discharge Plan and Services In-house Referral: Clinical Social Work, PCP / Psychologist, educational, Interpreting Services Discharge Planning Services: CM Consult, [facility], [facility], Medication Assistance, Follow-up appt scheduled Post Acute Care Choice: Durable Medical Equipment          DME Arranged:  (patient has available wheelchair in hospital room - [employer] to provide wheelchair legs and 3:1 to be delivered to the home by [employer]) DME Agency: [employer] Date DME Agency Contacted: 07/06/21 Time DME Agency Contacted: [id number] Representative spoke with at DME Agency: Bethanne Ginger, [place] with [employer]            Social Determinants of Health ([place]) Interventions     Readmission Risk Interventions Readmission Risk Prevention Plan 06/25/2021  Transportation Screening Complete  PCP or Specialist Appt within 5-7 Days Complete  Home Care Screening Complete  Medication Review (RN CM) Complete

## 2021-07-14 ENCOUNTER — Other Ambulatory Visit (HOSPITAL_COMMUNITY): Payer: Self-pay

## 2021-07-15 ENCOUNTER — Inpatient Hospital Stay: Payer: Self-pay | Admitting: Nurse Practitioner
